# Patient Record
Sex: Male | Born: 1950 | Race: White | Hispanic: No | Marital: Single | Smoking: Former smoker
Health system: Southern US, Community
[De-identification: ages and names within clinical notes are randomized; demographics above are authoritative.]

## PROBLEM LIST (undated history)

## (undated) DIAGNOSIS — E785 Hyperlipidemia, unspecified: Secondary | ICD-10-CM

## (undated) DIAGNOSIS — Q2112 Patent foramen ovale: Secondary | ICD-10-CM

## (undated) DIAGNOSIS — L409 Psoriasis, unspecified: Secondary | ICD-10-CM

## (undated) DIAGNOSIS — I82409 Acute embolism and thrombosis of unspecified deep veins of unspecified lower extremity: Secondary | ICD-10-CM

## (undated) DIAGNOSIS — I10 Essential (primary) hypertension: Secondary | ICD-10-CM

## (undated) DIAGNOSIS — I82401 Acute embolism and thrombosis of unspecified deep veins of right lower extremity: Secondary | ICD-10-CM

## (undated) DIAGNOSIS — F329 Major depressive disorder, single episode, unspecified: Secondary | ICD-10-CM

## (undated) DIAGNOSIS — I878 Other specified disorders of veins: Secondary | ICD-10-CM

## (undated) DIAGNOSIS — R569 Unspecified convulsions: Secondary | ICD-10-CM

## (undated) DIAGNOSIS — E119 Type 2 diabetes mellitus without complications: Secondary | ICD-10-CM

## (undated) DIAGNOSIS — M1990 Unspecified osteoarthritis, unspecified site: Secondary | ICD-10-CM

## (undated) DIAGNOSIS — G629 Polyneuropathy, unspecified: Secondary | ICD-10-CM

## (undated) DIAGNOSIS — K631 Perforation of intestine (nontraumatic): Secondary | ICD-10-CM

## (undated) DIAGNOSIS — Q211 Atrial septal defect: Secondary | ICD-10-CM

## (undated) DIAGNOSIS — F32A Depression, unspecified: Secondary | ICD-10-CM

## (undated) DIAGNOSIS — I4891 Unspecified atrial fibrillation: Secondary | ICD-10-CM

## (undated) DIAGNOSIS — N400 Enlarged prostate without lower urinary tract symptoms: Secondary | ICD-10-CM

## (undated) DIAGNOSIS — E669 Obesity, unspecified: Secondary | ICD-10-CM

## (undated) DIAGNOSIS — R29898 Other symptoms and signs involving the musculoskeletal system: Secondary | ICD-10-CM

## (undated) DIAGNOSIS — I80202 Phlebitis and thrombophlebitis of unspecified deep vessels of left lower extremity: Secondary | ICD-10-CM

## (undated) HISTORY — DX: Obesity, unspecified: E66.9

## (undated) HISTORY — DX: Perforation of intestine (nontraumatic): K63.1

## (undated) HISTORY — DX: Unspecified atrial fibrillation: I48.91

## (undated) HISTORY — DX: Patent foramen ovale: Q21.12

## (undated) HISTORY — PX: TONSILLECTOMY: SHX5217

## (undated) HISTORY — DX: Hyperlipidemia, unspecified: E78.5

## (undated) HISTORY — DX: Atrial septal defect: Q21.1

---

## 1999-04-02 ENCOUNTER — Emergency Department (HOSPITAL_COMMUNITY): Admission: EM | Admit: 1999-04-02 | Discharge: 1999-04-02 | Payer: Self-pay | Admitting: Emergency Medicine

## 1999-04-02 ENCOUNTER — Encounter: Payer: Self-pay | Admitting: Emergency Medicine

## 1999-04-10 ENCOUNTER — Emergency Department (HOSPITAL_COMMUNITY): Admission: EM | Admit: 1999-04-10 | Discharge: 1999-04-10 | Payer: Self-pay | Admitting: Emergency Medicine

## 2001-10-20 ENCOUNTER — Encounter (INDEPENDENT_AMBULATORY_CARE_PROVIDER_SITE_OTHER): Payer: Self-pay | Admitting: Specialist

## 2001-10-20 ENCOUNTER — Ambulatory Visit (HOSPITAL_COMMUNITY): Admission: RE | Admit: 2001-10-20 | Discharge: 2001-10-20 | Payer: Self-pay | Admitting: *Deleted

## 2003-05-07 ENCOUNTER — Inpatient Hospital Stay (HOSPITAL_COMMUNITY): Admission: EM | Admit: 2003-05-07 | Discharge: 2003-05-11 | Payer: Self-pay | Admitting: Emergency Medicine

## 2003-05-10 ENCOUNTER — Encounter: Payer: Self-pay | Admitting: Cardiology

## 2004-04-14 ENCOUNTER — Emergency Department (HOSPITAL_COMMUNITY): Admission: EM | Admit: 2004-04-14 | Discharge: 2004-04-14 | Payer: Self-pay | Admitting: Family Medicine

## 2004-06-27 ENCOUNTER — Inpatient Hospital Stay (HOSPITAL_COMMUNITY): Admission: EM | Admit: 2004-06-27 | Discharge: 2004-07-01 | Payer: Self-pay | Admitting: *Deleted

## 2007-01-25 ENCOUNTER — Encounter (HOSPITAL_BASED_OUTPATIENT_CLINIC_OR_DEPARTMENT_OTHER): Admission: RE | Admit: 2007-01-25 | Discharge: 2007-02-09 | Payer: Self-pay | Admitting: Surgery

## 2007-02-08 ENCOUNTER — Encounter (HOSPITAL_BASED_OUTPATIENT_CLINIC_OR_DEPARTMENT_OTHER): Admission: RE | Admit: 2007-02-08 | Discharge: 2007-02-25 | Payer: Self-pay | Admitting: Surgery

## 2007-04-19 ENCOUNTER — Ambulatory Visit (HOSPITAL_COMMUNITY): Admission: RE | Admit: 2007-04-19 | Discharge: 2007-04-19 | Payer: Self-pay | Admitting: *Deleted

## 2007-04-19 ENCOUNTER — Encounter (INDEPENDENT_AMBULATORY_CARE_PROVIDER_SITE_OTHER): Payer: Self-pay | Admitting: *Deleted

## 2008-03-06 ENCOUNTER — Emergency Department (HOSPITAL_COMMUNITY): Admission: EM | Admit: 2008-03-06 | Discharge: 2008-03-06 | Payer: Self-pay | Admitting: Family Medicine

## 2008-03-08 ENCOUNTER — Emergency Department (HOSPITAL_COMMUNITY): Admission: EM | Admit: 2008-03-08 | Discharge: 2008-03-08 | Payer: Self-pay | Admitting: Emergency Medicine

## 2008-03-10 ENCOUNTER — Emergency Department (HOSPITAL_COMMUNITY): Admission: EM | Admit: 2008-03-10 | Discharge: 2008-03-10 | Payer: Self-pay | Admitting: Emergency Medicine

## 2010-06-29 ENCOUNTER — Observation Stay (HOSPITAL_COMMUNITY)
Admission: EM | Admit: 2010-06-29 | Discharge: 2010-07-03 | Disposition: A | Discharge status: Skilled Nursing Facility | Payer: 59 | Attending: Internal Medicine | Admitting: Internal Medicine

## 2010-06-29 ENCOUNTER — Emergency Department (HOSPITAL_COMMUNITY): Payer: 59

## 2010-06-29 DIAGNOSIS — G609 Hereditary and idiopathic neuropathy, unspecified: Secondary | ICD-10-CM | POA: Insufficient documentation

## 2010-06-29 DIAGNOSIS — I872 Venous insufficiency (chronic) (peripheral): Secondary | ICD-10-CM | POA: Insufficient documentation

## 2010-06-29 DIAGNOSIS — M25569 Pain in unspecified knee: Secondary | ICD-10-CM | POA: Insufficient documentation

## 2010-06-29 DIAGNOSIS — M899 Disorder of bone, unspecified: Secondary | ICD-10-CM | POA: Insufficient documentation

## 2010-06-29 DIAGNOSIS — Z86718 Personal history of other venous thrombosis and embolism: Secondary | ICD-10-CM | POA: Insufficient documentation

## 2010-06-29 DIAGNOSIS — R209 Unspecified disturbances of skin sensation: Secondary | ICD-10-CM | POA: Insufficient documentation

## 2010-06-29 DIAGNOSIS — S82843A Displaced bimalleolar fracture of unspecified lower leg, initial encounter for closed fracture: Principal | ICD-10-CM | POA: Insufficient documentation

## 2010-06-29 DIAGNOSIS — G40909 Epilepsy, unspecified, not intractable, without status epilepticus: Secondary | ICD-10-CM | POA: Insufficient documentation

## 2010-06-29 DIAGNOSIS — Z79899 Other long term (current) drug therapy: Secondary | ICD-10-CM | POA: Insufficient documentation

## 2010-06-29 DIAGNOSIS — Z23 Encounter for immunization: Secondary | ICD-10-CM | POA: Insufficient documentation

## 2010-06-29 DIAGNOSIS — W010XXA Fall on same level from slipping, tripping and stumbling without subsequent striking against object, initial encounter: Secondary | ICD-10-CM | POA: Insufficient documentation

## 2010-06-29 DIAGNOSIS — E669 Obesity, unspecified: Secondary | ICD-10-CM | POA: Insufficient documentation

## 2010-06-29 LAB — DIFFERENTIAL
Basophils Absolute: 0 10*3/uL (ref 0.0–0.1)
Basophils Relative: 0 % (ref 0–1)
Lymphs Abs: 0.9 10*3/uL (ref 0.7–4.0)
Neutro Abs: 9.2 10*3/uL — ABNORMAL HIGH (ref 1.7–7.7)

## 2010-06-29 LAB — BASIC METABOLIC PANEL
BUN: 9 mg/dL (ref 6–23)
Creatinine, Ser: 0.7 mg/dL (ref 0.4–1.5)
GFR calc non Af Amer: >60 mL/min (ref >60)
Glucose, Bld: 107 mg/dL — ABNORMAL HIGH (ref 70–99)
Sodium: 136 mEq/L (ref 135–145)

## 2010-06-29 LAB — URINALYSIS, ROUTINE W REFLEX MICROSCOPIC
Nitrite: NEGATIVE
Specific Gravity, Urine: 1.011 (ref 1.005–1.030)
Urine Glucose, Fasting: NEGATIVE mg/dL
Urobilinogen, UA: 0.2 mg/dL (ref 0.0–1.0)
pH: 6 (ref 5.0–8.0)

## 2010-06-29 LAB — CBC
Hemoglobin: 15.8 g/dL (ref 13.0–17.0)
MCHC: 33.5 g/dL (ref 30.0–36.0)
RDW: 13.3 % (ref 11.5–15.5)

## 2010-06-29 LAB — PROTIME-INR: Prothrombin Time: 23 seconds — ABNORMAL HIGH (ref 11.6–15.2)

## 2010-06-30 LAB — COMPREHENSIVE METABOLIC PANEL
AST: 13 U/L (ref 0–37)
BUN: 9 mg/dL (ref 6–23)
Calcium: 8.5 mg/dL (ref 8.4–10.5)
Chloride: 107 mEq/L (ref 96–112)
Creatinine, Ser: 0.69 mg/dL (ref 0.4–1.5)
GFR calc Af Amer: >60 mL/min (ref >60)
Glucose, Bld: 101 mg/dL — ABNORMAL HIGH (ref 70–99)

## 2010-06-30 LAB — PROTIME-INR: INR: 2.31 — ABNORMAL HIGH (ref 0.00–1.49)

## 2010-06-30 LAB — CBC
Hemoglobin: 15.5 g/dL (ref 13.0–17.0)
MCH: 31.3 pg (ref 26.0–34.0)
MCHC: 32.8 g/dL (ref 30.0–36.0)
RDW: 13.4 % (ref 11.5–15.5)

## 2010-06-30 LAB — PHOSPHORUS: Phosphorus: 3.6 mg/dL (ref 2.3–4.6)

## 2010-06-30 LAB — PHENYTOIN LEVEL, TOTAL: Phenytoin Lvl: 21.6 ug/mL — ABNORMAL HIGH (ref 10.0–20.0)

## 2010-07-01 LAB — PROTIME-INR: INR: 2.84 — ABNORMAL HIGH (ref 0.00–1.49)

## 2010-07-01 LAB — URINE CULTURE: Special Requests: NEGATIVE

## 2010-07-02 LAB — BASIC METABOLIC PANEL
CO2: 27 mEq/L (ref 19–32)
Calcium: 8.4 mg/dL (ref 8.4–10.5)
GFR calc non Af Amer: >60 mL/min (ref >60)
Glucose, Bld: 98 mg/dL (ref 70–99)
Potassium: 3.3 mEq/L — ABNORMAL LOW (ref 3.5–5.1)
Sodium: 137 mEq/L (ref 135–145)

## 2010-07-02 LAB — CBC
Hemoglobin: 15 g/dL (ref 13.0–17.0)
MCH: 33.3 pg (ref 26.0–34.0)
RBC: 4.51 MIL/uL (ref 4.22–5.81)
RDW: 13.3 % (ref 11.5–15.5)
WBC: 6.3 10*3/uL (ref 4.0–10.5)

## 2010-07-02 LAB — PROTIME-INR: INR: 2.76 — ABNORMAL HIGH (ref 0.00–1.49)

## 2010-07-02 NOTE — Consult Note (Signed)
  NAME:  [name], [name]                ACCOUNT NO.:  [id number]  MEDICAL RECORD NO.:  [id number]           PATIENT TYPE:  E  LOCATION:  WLED                         FACILITY:  [facility]  PHYSICIAN:  Alvy Beal, MD    DATE OF BIRTH:  [birth year]  DATE OF CONSULTATION:  06/29/2010 DATE OF DISCHARGE:                                CONSULTATION   REASON FOR CONSULTATION:  Right ankle fracture.  HISTORY:  This is a pleasant 60 year old gentleman who presents to the emergency room after a fall.  He is alert and oriented x3.  The fall happened at approximately 4:45 this morning.  He got out of bed and he was walking to the bathroom when he tripped and injured his right ankle and knee.  He has been unable to bear weight and he has been having significant ankle pain.  As a result he went to the emergency room.  X- rays here in the ER demonstrated a nondisplaced bimalleolar ankle fracture and so he was admitted to the Medical Service and I was consulted.  PAST MEDICAL HISTORY:  The patient has a history of DVTs, seizure disorders, peripheral neuropathy and obesity.  SOCIAL HISTORY:  He lives at home by myself and he is unable to return home currently to take care of himself.  He has had a tonsillectomy.  He is a nondrinker, non drug abuser.  ALLERGIES:  He is allergic to PENICILLIN.  PRIMARY CARE PHYSICIAN:  His neurologist, Genene Churn. Love, M.D.  MEDICATIONS:  Include Coumadin, Dilantin, oxycodone and Keppra.  PHYSICAL EXAMINATION:  He is a pleasant gentleman who appears his stated age in no acute distress.  He is alert and oriented x3.  He has no upper extremity painful range of motion and no gross crepitus, deformity or pain.  No shortness of breath or chest pain.  The abdomen is soft and nontender.  No hip or significant knee pain bilaterally.  He does have diminished sensation throughout in the lower extremities.  His capillary refill on the right side is less than 2 seconds.  He has  a splint applied, which is in good condition.  RADIOLOGIC:  At this point in time, x-rays were reviewed.  He has minimally displaced bimalleolar ankle fractures.  No evidence of any instability or subluxation.  PLAN:  At this point in time we will plan on treating him conservatively.  The patient was admitted because he cannot be discharged to home by himself.  He will be admitted for 3 days.  On Monday, prior to his discharge, I will convert him from the splint to a cast.  I think that he can be successfully treated nonoperatively with cast immobilization and nonweightbearing.  Continue to elevate and ice the ankle appropriately.     Alvy Beal, MD     DDB/MEDQ  D:  06/29/2010  T:  06/29/2010  Job:  [id number]  cc:   Genene Churn. Love, M.D. Fax: [phone]  Alvy Beal, MD Fax: [phone]  Electronically Signed by Venita Lick MD on 07/02/2010 02:30:42 PM

## 2010-07-03 LAB — PROTIME-INR: INR: 2.41 — ABNORMAL HIGH (ref 0.00–1.49)

## 2010-07-10 NOTE — H&P (Signed)
NAME:  [name], [name]                ACCOUNT NO.:  [id number]  MEDICAL RECORD NO.:  [id number]           PATIENT TYPE:  I  LOCATION:  1608                         FACILITY:  [facility]  PHYSICIAN:  Sixto Bowdish I Embry Huss, MD      DATE OF BIRTH:  [birth year]  DATE OF ADMISSION:  06/29/2010 DATE OF DISCHARGE:                             HISTORY & PHYSICAL   PRIMARY CARE PHYSICIAN:  Georgianne Fick, M.D.  CHIEF COMPLAINT:  Right ankle pain and status post fall for 1 day.  HISTORY OF PRESENT ILLNESS:  This is a 60 year old very pleasant gentleman who presented to the emergency room with chief complaint of fall.  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  He experienced a trip today as he has decreased sensation in his lower extremity and injured his right ankle most likely.  He was unable to bear weight on the right foot since the fall.  The patient denies any chest pain.  Denies any shortness of breath.  Denies any syncopal episode.  Pain was 8/10, also associated with some numbness.  In the emergency room, the patient found to have bimalleolar fracture of his right foot, but as the patient lives alone, and he lives with his sister who is having multiple comorbidities, unable to take care of him.  He decided, he would like to be placed.  We were asked to admit the patient for placement and management.  PAST MEDICAL HISTORY:  Significant for, 1. History of recurrent DVTs, on chronic Coumadin. 2. History of seizure disorder. 3. Peripheral neuropathy. 4. Obesity. 5. Chronic venous stasis.  ALLERGIES:  PENICILLIN.  MEDICATIONS:  The patient is on Coumadin, Dilantin, and Keppra.  Also on the medication to be reconciled by pharmacy.  FAMILY HISTORY:  Hypertension, hyperlipidemia, and diabetes.  SOCIAL HISTORY:  He smokes pipe regularly.  Denies any alcohol or using drugs.  He is retired.  REVIEW OF  SYSTEMS:  The patient denies any headache.  Denies any shortness of breath.  Denies any chest pain or abdominal pain.  Denies any nausea or vomiting.  Denies any numbness or weakness of his extremities.  PHYSICAL EXAMINATION:  GENERAL:  Very pleasant gentleman, lying comfortably in bed, no respiratory distress or shortness of breath. VITAL SIGNS:  Temperature 97.4, blood pressure 134/84, pulse 64, respiratory rate 17, saturation 97% on room air. HEENT:  Normocephalic and atraumatic.  Pupils equal reactive to light and accommodation.  Extraocular muscle movement was normal. NECK:  No lymphadenopathy.  No JVD. HEART:  S1 and S2.  There are no added sounds. ABDOMEN:  Obese, soft, nontender.  Bowel sounds positive. EXTREMITIES:Lower extremities edematous bilaterally and there is chronic venous stasis changes. NEUROLOGIC:  The patient moves all 4 extremities.  The patient is awake, alert, and oriented x3 with no focal neurological deficits.  LABORATORY DATA:  Blood work pending.  X-rays of the knee show no acute abnormality except for bilateral malleolar fracture.  ASSESSMENT/PLAN: 1. Mechanical fall. 2. Bilateral malleolar fracture. 3. Ongoing chronic anticoagulation. 4. History of seizures.  I  would plan to admit the patient to regular     floor.  Consulted Dr. Shon Baton for evaluation of bilateral malleolar     fracture, being controlled with his anticoagulation, mainly     Coumadin.  By pharmacy, we will get Dilantin level.  We will resume     the patient's medications.  I will ask Social Work's help with     placement. 5. Deep vein thrombosis and gastrointestinal prophylaxis.     Eural Holzschuh Bosie Helper, MD     HIE/MEDQ  D:  06/30/2010  T:  07/01/2010  Job:  [id number]  Electronically Signed by Ebony Cargo MD on 07/10/2010 02:19:36 PM

## 2010-07-10 NOTE — Discharge Summary (Signed)
NAMEPINCHOS, TOPEL                ACCOUNT NO.:  [id number]  MEDICAL RECORD NO.:  [id number]           PATIENT TYPE:  I  LOCATION:  1608                         FACILITY:  [facility]  PHYSICIAN:  Grayden Burley I Shaniyah Wix, MD      DATE OF BIRTH:  1951/04/08  DATE OF ADMISSION:  06/29/2010 DATE OF DISCHARGE:  07/03/2009                              DISCHARGE SUMMARY   PRIMARY CARE PHYSICIAN:  Georgianne Fick, MD  DISCHARGE DIAGNOSES: 1. Status post fall without evidence of syncope. 2. Bimalleolar ankle fracture, status post cast placement, done by Dr.     Shon Baton. 3. History of chronic recurrent deep venous thromboses, on chronic     anticoagulation. 4. History of seizure. 5. History of peripheral neuropathy. 6. History of venous stasis. 7. Obesity.  DISCHARGE MEDICATIONS: 1. Coumadin 1 mg p.o. daily, alternate with 0.5 mg daily. 2. Dilantin 100 mg p.o. 3 times daily. 3. Vitamin B12, 1000 mg p.o. daily. 4. Keppra 750 mg, total of 1500 mg 3 times daily. 5. Calcium carbonate/vitamin D 600/400, 1 tablet daily. 6. Triamcinolone cream, apply to the legs.  CONSULTATIONS:  Orthopedics consulted, done by Dr. Shon Baton.  PROCEDURES: 1. Chest x-ray, no acute abnormalities. 2. Knee x-ray, no acute abnormalities. 3. Right ankle, nondisplaced, medial and lateral malleolar fracture.  HISTORY OF PRESENT ILLNESS:  This is a 60 year old very pleasant gentleman, presented to the ED with complaint of fall, had a history of peripheral neuropathy and history of recurrent DVTs and chronic venous stasis.  He had experienced a trip today.  He denies any loss of conscious.  Denies any chest pain, denies any shortness of breath.  When presented to the ED, patient found to have bimalleolar fracture of his right lower extremity, and he lives alone.  We were asked to admit for possible placement. 1. Right bimalleolar fracture, has a cast and per Dr. Shon Baton'     recommendation needs to have nonweightbearing on the  right leg and     he needs to follow up within 1 week with Dr. Shon Baton at [facility], [phone]; have the cast placed there. 2. Seizure, patient's Dilantin level was high, which is 21.9, but in     consideration, albumin is 3.4, usual goal is 10 to 20, will     decrease the dose to 100 mg p.o. t.i.d. and patient will continue     with home dose of Keppra. 3. Chronic DVTs, patient will continue with Coumadin, patient takes     0.5 to 1 mg and we will ask the nursing home to adjust the dose of     Coumadin.  Currently, we felt the patient is stable for discharge,     further     recommendation to be dictated by the discharge physician if needed. 4. The patient will need to follow up with Dr. Shon Baton within in 1 week     at [facility] and continue physical therapies as does     need with nonweightbearing of his right lower extremities.     Mckay Tegtmeyer I Eda Paschal, MD     HIE/MEDQ  D:  07/02/2010  T:  07/02/2010  Job:  [id number]  Electronically Signed by Ebony Cargo MD on 07/10/2010 02:19:27 PM

## 2010-09-24 NOTE — Assessment & Plan Note (Signed)
[facility]   NAME:  [name], [name]                ACCOUNT NO.:  [id number]   MEDICAL RECORD NO.:  [id number]      DATE OF BIRTH:  Jul 06, 1950   PHYSICIAN:  Jake Shark A. Tanda Rockers, M.D. 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  In the interim he was treated with a non-compressive wrap,  he continues to be ambulatory.  There has been no pain, no fever.  He  continues to have weeping.  He continues on his doxycycline.   OBJECTIVE:  His blood pressure is 140/83, respirations 18, pulse rate  68, temperature 98.2.  He is accompanied by sister.  Inspection of the  left lower extremity shows severe desquamative changes with weeping and  local warmth.  His edema extends onto the dorsum of the foot.  The  capillary refill is brisk, pedal pulses are indeterminate.  There is 3+  edema with chronic changes to a lesser degree in the right lower  extremity also.   ASSESSMENT:  Severe postphlebitic syndrome with stasis cellulitis.   PLAN:  We are placing the patient in a left Unna wrap with triamcinolone  cream.  We will place lamb's wool between his toes to avoid compression.  We will reevaluate him in 1 week.  Ultimately the patient will require  custom below-the-knee stockings to avoid secondary breakdown related to  the postphlebitic state.  We have explained this approach to the patient  and his sister in terms they seem to understand.  We have given him an  opportunity to ask questions.  They express gratitude for having been  seen in the clinic and indicate that they will be compliant.      Harold A. Tanda Rockers, M.D.  Electronically Signed     HAN/MEDQ  D:  02/10/2007  T:  02/11/2007  Job:  [id number]

## 2010-09-24 NOTE — Consult Note (Signed)
NAME:  [name], TRICKEY                ACCOUNT NO.:  [id number]   MEDICAL RECORD NO.:  [id number]          PATIENT TYPE:  REC   LOCATION:  FOOT                         FACILITY:  [facility]   PHYSICIAN:  Jonelle Sports. Sevier, M.D. DATE OF BIRTH:  12/02/1950   DATE OF CONSULTATION:  02/02/2007  DATE OF DISCHARGE:                                 CONSULTATION   HISTORY:  This 60 year old white male is referred at the courtesy of Dr.  Nicholos Johns for assistance with management of a venous stasis  ulceration on the medial malleolar area of the left lower extremity.   The patient has a complex medical history and has had at least two  episodes of deep vein thrombosis in the past, both in the left lower  extremity, the most recent one occurring some 6 years ago.  He has been  chronically anticoagulated with Coumadin.   Beginning 5 or 6 weeks ago, he noted increased swelling, redness, and  blister-like formation on the dorsal aspect on that left lower extremity  and this gradually progressed into the malleolar and supramalleolar area  as well.  He apparently initially was given a course of antibiotics  which was not continued after the first 1-week course.  Most recently he  has been treating this just with the application of Polysporin.  He was  referred here for further evaluation and advice.  Apparently was at one  point an open ulceration in the medial malleolar or gator area, and this  has since healed, although the entire area remains reddened and weepy.   PAST MEDICAL HISTORY:  Notable for no previous surgeries.  He has had  several hospitalizations in conjunction with his DVT and other problems.   ALLERGIES:  HE IS SAID TO BE ALLERGIC TO PENICILLIN BUT ON CLOSER  QUESTIONING THIS IS JUST AN EXTREMELY STRONG FAMILY HISTORY TO  PENICILLIN AND ITS DERIVATIVES.  HE HAS A BROTHER WHO WAS PLACED ON A  PENICILLIN DERIVATIVE, PRESUMABLY A CEPHALOSPORIN, AND HAD A SEVERE  REACTION WITHOUT HAVING HAD  ANY PREVIOUS EXPOSURE TO PENICILLIN.  I  THINK IT A FAIR ASSUMPTION THAT THE PATIENT MIGHT WELL BE EQUALLY  SENSITIVE.   CURRENT MEDICATIONS:  1. Neurontin 800 mg q.i.d.  2. Dilantin 200 mg b.i.d.  3. Coumadin 5 mg daily.  4. Keppra 250 mg 2 twice daily.  5. Calcium tablet daily.  6. Levaquin 500 mg daily which has been discontinued.  7. Protonix 40 mg per day.  8. Lortab 5/500 on a p.r.n. basis.  9. Colace 1 capsule b.i.d.  10.Vitamin B12 orally 100 mg daily.   PERSONAL HISTORY:  The patient is retired and really essentially  disabled by his leg.  He gets very little physical activity.  He has  been a smoker of a pipe for some 20+ years.  He denies use of alcoholic  beverages or recreational drugs.  His last tetanus immunization was in  2008.   REVIEW OF SYSTEMS:  The patient has longstanding seizure disorder which  has been well-controlled lately on the multiplicity of medications as  indicated above.  He  also is said to have a history of PVD, but this is  probably venous disease not arterial, with the DVT history as indicated  in the present illness.  He also has some degree of peripheral  neuropathy, the basis for which is not certain.  He has had bone density  study done 2 months ago which he was told was satisfactory.   FAMILY HISTORY:  The family history, which should have been included  above, does include high blood pressure and heart attack, neither of  which the patient has experienced, and also diabetes, again with the  patient having no apparent personal history of this disease.   PHYSICAL EXAMINATION:  Examination examination today is limited to vital  signs and lower extremities.  VITAL SIGNS:  Blood pressure is 139/74, pulse is 65 and regular,  respirations 18, temperature 97.8.  EXTREMITIES:  Both lower extremities show evidence of chronic venous  insufficiency with some chronic edema and stasis changes, this being  relatively minimal on the right but with  extensive swelling,  cobblestoning, and severe discoloration of the left lower extremity.  More importantly, in a boot-like distribution from approximately the  distal third of the tibia all way out to the base of the toes, there is  redness, swelling, and desquamation of the skin of the left lower  extremity with considerable warmth in this area and some weeping.  There  is no open ulceration at this point.  Skin temperature is clearly quite  elevated, particular in comparison to the contralateral side.  There is  so much edema on the dorsum of the foot that pulses are not palpable,  but on Doppler determination, we were able to hear a good signal and the  wave indeed appears to be triphasic.   IMPRESSION:  History of deep vein thrombosis with postphlebitic syndrome  on the left lower extremity with recent ulceration spontaneously healed.  Cellulitis of the distal lower extremity.   DISPOSITION:  The plan here is to see if we can control what appears to  be an apparent superficial infection with cellulitis to some extent  before proceeding to wraps of this extremity.  Once those are completed  and the leg is well-healed, this gentleman may well be a candidate for  venous pump, particularly in this left lower extremity.   Accordingly, the leg is cleansed today and is covered with an  application of Neosporin and this in turn covered with a soft Kerlix  wrap.   The patient is instructed to redress the leg daily, cleansing it gently  with mild soapy water, rinsing it well, patting it dry, and then  reapplying Neosporin to the reddened areas and returning the extremity  to a soft, noncompressive bulky wrap.   He is advised to keep the foot elevated as much as possible.   He is given a prescription for doxycycline to be taken 200 mg initially  and then 100 mg daily.  He was given a total of 20 of these with no  refill.   FOLLOW UP:  Followup visit will be here in 1 week, and  hopefully by that  time he will be ready for Unna wrap.           ______________________________  Jonelle Sports. Cheryll Cockayne, M.D.     RES/MEDQ  D:  02/02/2007  T:  02/02/2007  Job:  [id number]

## 2010-09-24 NOTE — Op Note (Signed)
NAME:  [name], [name]                ACCOUNT NO.:  [id number]   MEDICAL RECORD NO.:  [id number]          PATIENT TYPE:  AMB   LOCATION:  ENDO                         FACILITY:  [facility]   PHYSICIAN:  Georgiana Spinner, M.D.    DATE OF BIRTH:  09/09/1950   DATE OF PROCEDURE:  04/19/2007  DATE OF DISCHARGE:                               OPERATIVE REPORT   PROCEDURE:  Colonoscopy with polypectomy and biopsy.   INDICATIONS:  Colon polyps.   ANESTHESIA:  Fentanyl 75 mcg, Versed 5 mg.   PROCEDURE:  With the patient mildly sedated in the left lateral  decubitus position, a rectal examination was performed.  Limited examine  of the prostate was normal.  Subsequently the Pentax videoscopic  colonoscope was inserted in the rectum and passed under direct vision to  the cecum identified by ileocecal valve and appendiceal orifice, both of  which were photographed.  From this point colonoscope was slowly  withdrawn taking circumferential views of colonic mucosa stopping in the  just above the cecum where a polyp was seen photographed, removed using  snare cautery technique setting of 20/150 blended current.  We then  removed the remainder of the polyp with hot biopsy forceps and retrieved  all the tissue.  From this point colonoscope was slowly withdrawn taking  circumferential views of colonic mucosa stopping then only in the rectum  which appeared normal on direct showed hemorrhoids on retroflexed view.  The endoscope was straightened and withdrawn.  The patient's vital  signs, pulse oximeter remained stable.  The patient tolerated procedure  well without apparent complications.   FINDINGS:  Polyp near the cecum removed and internal hemorrhoids were  also noted.   PLAN:  Await biopsy report.  The patient will call me for results and  follow-up with me as an outpatient.           ______________________________  Georgiana Spinner, M.D.     GMO/MEDQ  D:  04/19/2007  T:  04/19/2007  Job:  [id number]

## 2010-09-24 NOTE — Assessment & Plan Note (Signed)
[facility]   NAME:  [name], [name]                ACCOUNT NO.:  [id number]   MEDICAL RECORD NO.:  [id number]      DATE OF BIRTH:  Apr 29, 1951   PHYSICIAN:  Jake Shark A. Tanda Rockers, M.D. VISIT DATE:  02/18/2007                                   OFFICE VISIT   SUBJECTIVE:  [name] is a 60 year old man who we are following for  stasis and weeping.  There has never been frank ulceration.  Over the  last 2 weeks, we have treated him with serial compression wrap.  He  returns for follow-up.  There has been no interim drainage, malodor or  ulceration.  He continues to be ambulatory.   OBJECTIVE:  Blood pressure is 151/86, respirations 18, pulse rate 64,  temperature is 97.  He is accompanied by his sister.  Inspection of the  left lower extremity shows that there is a persistence of stasis changes  with +2 to 3 edema and desquamation with shedding of skin.  There are  absolutely no ulcerations.  Compression has been relatively successful.  It is manifested by some areas of wrinkling in the mid lower extremity.   ASSESSMENT:  Stasis without ulceration.   RECOMMENDATIONS:  We are making a transition from the Unna wrap to  bilateral open toed, below-the-knee, 30-40 mm compression hose.  We have  explained the use of external compressive hose in the management of  chronic stasis disease.  The patient seems to understand.  He has been  instructed to procure these garments and to wear them.  In addition, we  have given him a prescription for triamcinolone ointment.  He will apply  the ointment at night and will wear a long cotton sock.  He will shower  twice daily and wear the support hose during his awake hours.  He is not  to sleep in the support hose.  He will need to replace the hose  approximately every 3-4 months or when the elasticity has been spent.   We have given the patient and his sister an opportunity to ask  questions.  They both seem to understand and indicate  that they will be  compliant. He is discharged.      Harold A. Tanda Rockers, M.D.  Electronically Signed     HAN/MEDQ  D:  02/18/2007  T:  02/19/2007  Job:  [id number]   cc:   Georgianne Fick, M.D.

## 2010-09-27 NOTE — Discharge Summary (Signed)
[name], [name]                ACCOUNT NO.:  [id number]   MEDICAL RECORD NO.:  [id number]          PATIENT TYPE:  INP   LOCATION:  6709                         FACILITY:  [facility]   PHYSICIAN:  Michaelyn Barter, M.D. DATE OF BIRTH:  1950/08/15   DATE OF ADMISSION:  06/27/2004  DATE OF DISCHARGE:  07/01/2004                                 DISCHARGE SUMMARY   FINAL DIAGNOSES:  1.  Right leg deep vein thrombosis.  2.  Right leg cellulitis.   PROCEDURES:  1.  Abdominal ultrasound, June 28, 2004.  2.  Left knee x-ray, June 27, 2004.  3.  Chest x-ray and venous duplex of the right lower extremity, June 28, 2004.   HISTORY OF PRESENT ILLNESS:  [name] is a 61 year old gentleman with a  past medical history of left lower extremity cellulitis and DVT who  presented with a chief complaint of right lower leg swelling for 4 days.  He  also complained of some erythema and inability to ambulate secondary to  pain.  He went on to say that his symptoms had begun in December, at which  time he was seen by his primary care physician, who prescribed him oral  antibiotics.  He went on to state that he saw his primary care physician  again approximately 2 weeks prior to this admission, and was started on  cefadroxil.  He completed the 2-week course of antibiotics; however, over  the 4 days prior to this admission, his pain increased.   PAST MEDICAL HISTORY:  1.  Seizure disorder.  2.  Peripheral neuropathy.  3.  DVT of the left lower extremity.   ALLERGIES:  The patient stated that his family was allergic to PENICILLIN,  and he decided that he would prefer not to take penicillin secondary to a  questionable allergy himself to penicillin.   FAMILY HISTORY:  Hypertension.  Hyperlipidemia.  Diabetes mellitus in  mother, father, sister, and brother.   SOCIAL HISTORY:  The patient smokes a pipe.  Alcohol is occasional.  Street  drugs denied.   HOSPITAL COURSE:  1.  Right  lower extremity cellulitis.  The patient was admitted into the      hospital and started on IV antibiotics.  Initially, Ancef 1 gm IV q.8h.      was initiated.  He was also provided Lasix for the edema, and over the      course of his hospitalization, the patient's complaints regarding his      right leg cellulitis decreased.  He completed 4 days of IV antibiotics      for the cellulitis, and he appeared to respond well to the IV      antibiotics.  2.  Right lower extremity deep vein thrombosis.  A venous duplex was      completed on June 28, 2004.  The final impression was within the      right leg there appeared to be an occlusive DVT in the common femoral,      femoral, profunda, and popliteal veins.  The greater saphenous vein  appears thrombosed, as well.  Left - there appears to be occlusive      superficial thrombus in the greater saphenous vein proximally and down      the leg.  The common femoral vein appears patent.  No evidence of DVT      bilaterally.  No evidence of Baker's cyst.  The patient's Coumadin,      which he had previously been on, was optimized at this particular time,      and, again, over the course of the patient's hospital, he appeared to      have no additional complaints.  The patient did have x-rays completed of      his left knee, the final results of which were no acute bony findings,      no definite joint effusion.  In addition, when the patient initially      came into the hospital, he had a chest x-ray completed, which was      interpreted as low volume chest film with vascular crowding, bibasilar      atelectasis, and accentuation of the heart size.  No edema or effusions.      Question dilated small bowel in the upper abdomen.  He also had an      abdominal ultrasound completed, and the final impression of this was the      study is somewhat limited by gas and the patient's body habitus.  The      gallbladder, common bile duct, and right lobe  of the liver are normal,      except for some fatty infiltration and diffuse increase in echogenicity.   The patient appeared to improve over the course of his hospitalization.  Therefore, the patient was made to discharge the patient home.  The patient  was discharged to home on July 01, 2004.   DISCHARGE MEDICATIONS:  1.  Levaquin 500 mg p.o. daily.  2.  Coumadin 5 mg on Monday, Wednesday, Thursday, Friday, and Sunday.  3.  Coumadin 7.5 mg on Tuesday and Saturday.  4.  Colace 100 mg b.i.d.  5.  Neurontin 800 mg q.i.d.  6.  Protonix 40 mg daily.  7.  Lortab 5/500, 1-2 tablets q.6h. p.r.n. for pain.   DISCHARGE INSTRUCTIONS:  1.  He was instructed to see his primary care physician, Dr. Nicholos Johns,      within 1-2 weeks for follow up.  2.  He was to take all of his medications as prescribed.  3.  [facility] was to provide a tall rolling walker.      OR/MEDQ  D:  09/15/2004  T:  09/16/2004  Job:  [id number]   cc:   Georgianne Fick, M.D.  [address]  [place]  [place] [address]  Fax: [phone]

## 2010-09-27 NOTE — Procedures (Signed)
[facility]  Patient:    [name], [name] Visit Number: [id number] MRN: [id number]          Service Type: END Location: ENDO Attending Physician:  Sabino Gasser Dictated by:   Sabino Gasser, M.D. Proc. Date: 10/20/01 Admit Date:  10/20/2001                             Procedure Report  PROCEDURE:  Colonoscopy.  INDICATION FOR PROCEDURE:  Colon polyp, family history of colon cancer.  ANESTHESIA:  None further given.  DESCRIPTION OF PROCEDURE:  With the patient mildly sedated in the left lateral decubitus position, a rectal exam was performed which was unremarkable other than external hemorrhoids. Subsequently, the Olympus videoscopic colonoscope was inserted in the rectum and passed under direct vision to the cecum identified by the ileocecal valve and appendiceal orifice both of which were photographed. From this point, the colonoscope was slowly withdrawn taking circumferential views of the entire colonic mucosa, stopping only at approximately 25 cm from the anal verge at which point a small polyp was seen, photographed and removed using snare cautery technique on a setting of 20:20 blended current. We withdrew then all the way to the rectum which appeared normal in direct and showed hemorrhoids on retroflexed view. The endoscope was straightened and withdrawn. The patients vital signs and pulse oximeter remained stable. The patient tolerated the procedure well without apparent complications.  FINDINGS:  Polyp at 25 cm, internal hemorrhoids, external hemorrhoids.  PLAN:  Await biopsy report. The patient will call me for results and followup with me as an outpatient. Dictated by:   Sabino Gasser, M.D. Attending Physician:  Sabino Gasser DD:  10/20/01 TD:  10/21/01 Job: [id number]

## 2010-09-27 NOTE — H&P (Signed)
NAME:  [name], [name]                ACCOUNT NO.:  [id number]   MEDICAL RECORD NO.:  [id number]          [name] TYPE:  EMS   LOCATION:  MAJO                         FACILITY:  [facility]   PHYSICIAN:  Mallory Shirk, MD     DATE OF BIRTH:  1951-02-17   DATE OF ADMISSION:  06/27/2004  DATE OF DISCHARGE:                                HISTORY & PHYSICAL   CHIEF COMPLAINT:  Right lower leg swelling, pain x4 days.   HISTORY OF PRESENT ILLNESS:  [name] is a very pleasant 60 year old  Caucasian gentleman with a history of left lower extremity cellulitis, DVTs  on Coumadin with INR 2.1 presents with complaint of right lower extremity  pain, swelling, erythema and inability to ambulate secondary to pain x4  days.  [name] states symptoms started last December, was seen by PCP and  given some p.o. antibiotics (name which he does not remember), was seen 2  weeks ago by Dr. Nicholos Johns again for swelling of the right lower  extremity and given a first generation cephalosporin (cefadroxil).  He has  completed the 2-week course however, his pain increased 4 days ago.  He  noticed an episode of chills with diaphoresis but no fever at the onset of  this.  The pain got progressively worse to a point where today he was unable  to ambulate.  No other complaints at this time.   PAST MEDICAL HISTORY:  1.  Seizure disorder.  2.  Peripheral neuropathy.  3.  DVT left lower extremity.   MEDICATIONS ON ADMISSION:  1.  Dilantin 200 mg p.o. b.i.d.  2.  Keppra 1500 mg p.o. b.i.d.  3.  Neurontin 800 mg p.o. q.i.d.  4.  Lortab 5/500 one tablet p.o. t.i.d. p.r.n. pain.  5.  Coumadin 5 mg p.o. once daily with 7.5 mg on Saturday and Tuesday.   ALLERGIES:  [name] states that his family has allergies to PENICILLIN so he  believes he has a PENICILLIN allergy even though it has not been documented;  however, he prefers not to take PENICILLIN at this point.   FAMILY HISTORY:  Hypertension, hyperlipidemia and  diabetes mellitus in  mother, father, sister and brother.   SOCIAL HISTORY:  [name] smokes a pipe regularly, occasional alcohol use, no  drug use.   REVIEW OF SYSTEMS:  [name] denies any headaches, shortness of breath, chest  pain, abdominal pain, recent change in bowel habits, recent change in  appetite or weight change.  [name] does complain of right lower extremity  pain as described in the HPI.   PHYSICAL EXAMINATION:  GENERAL:  Very pleasant Caucasian gentleman lying in  bed in no acute distress alert and oriented x3 cooperative with the exam.  VITAL SIGNS ON ADMISSION:  Blood pressure 119/68, pulse 100, respirations  16, temperature 98.4.  HEENT:  Normocephalic, atraumatic, PERRL, sclerae anicteric, mucous  membranes moist, oropharynx nonerythematous, neck supple, no  lymphadenopathy, no JVD.  LUNGS:  Clear to auscultation bilaterally.  CVS:  S1 and S2 regular rate and rhythm, no murmurs, rubs or gallops.  ABDOMEN:  Obese, soft, positive bowel  sounds, no tenderness, no masses.  EXTREMITIES:  Right lower extremity edematous, 2+ pitting edema, [name] had  difficulty bending his right knee secondary to the edema and tightness.  Left lower extremity venostasis changes seen.  [name] able to move right  knee with difficulty, left knee range of motion intact.  NEUROLOGIC:  Cranial nerves II-XII grossly intact.  Essentially motor within  normal limits.  No focal deficits seen.   LABORATORIES ON ADMISSION:  WBC 10.7, hemoglobin 15, hematocrit 43.7,  platelets 177.  PT 20, INR 2.1, PTT 62.  Sodium 136, potassium 4.2, chloride  104, carbon dioxide 24, glucose 112, BUN 19, creatinine 0.9, total bili 0.7,  alkaline phosphatase 187, SGOT 104, SGPT 146, total protein 6.5, albumin  2.5, calcium 8.5, BNP (beta natriuretic peptide) less than 30.   ASSESSMENT AND PLAN:  [age]-year-old Caucasian gentleman with a history  of lower extremity cellulitis on Coumadin presenting with right  lower  extremity pain, swelling, erythema x4 days.   Problem 1. RIGHT LOWER EXTREMITY CELLULITIS.  [name] is started on Ancef 1  g IV q.8h.  Dopplers will be done to rule out deep vein thrombosis.  [name]  has been given 20 mg of Lasix for edema in the right lower extremity.  Even  though BNP is less than 30 no evidence of congestive heart failure on  physical exam.  Chest x-ray still pending.   Problem 2. RIGHT KNEE PAIN.  Right knee will be x-rayed to rule out any  damage to the joint.   Problem 3. PROPHYLAXIS.  [name] is on Coumadin.  INR 2.1.  Coumadin  discontinued.  Protonix 40 mg p.o. once daily.   Problem 4. SEIZURE DISORDER.  [name]'s Dilantin, Keppra and Neurontin have  been restarted.   Problem 5. PAIN MANAGEMENT.  [name] is on Lortab 5/500 q.4-6h. p.r.n. pain.   DISPOSITION:  [name] will need some physical therapy since there is some  evidence of deconditioning.  Social work consult has been requested.  PT/OT  evaluation has also been requested.      GDK/MEDQ  D:  06/27/2004  T:  06/27/2004  Job:  [id number]   cc:   Georgianne Fick, M.D.  [address]  [place]  [place] [address]  Fax: [phone]

## 2010-09-27 NOTE — Discharge Summary (Signed)
NAME:  [name], [name]                          ACCOUNT NO.:  [id number]   MEDICAL RECORD NO.:  [id number]                   PATIENT TYPE:  INP   LOCATION:  3008                                 FACILITY:  [facility]   PHYSICIAN:  Genene Churn. Love, M.D.                 DATE OF BIRTH:  04-Mar-1951   DATE OF ADMISSION:  05/07/2003  DATE OF DISCHARGE:  05/11/2003                                 DISCHARGE SUMMARY   PATIENT ADDRESS:  [address] [place], [place] [address]   HISTORY OF PRESENT ILLNESS:  This is one of multiple [facility]  admissions for this 60 year old right-handed, white, single male who has a  20-year history of epilepsy and is now admitted for recurrent right brain  focal seizures.   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 These can be associated with a laugh followed by his head turning  to the left.  They usually last about 45 seconds.  While on Dilantin and  Neurontin, in the past, he has had seizures as frequently as once per month  that are simple partial. He developed symptoms of a neuropathy and has been  changed, 6 months ago, from Dilantin to Keppra 1000 mg twice per day,  results on 800 mg t.i.d.; and Coumadin 2.5 mg per day.   He was in his usual state of health, was seen as an outpatient for left leg  cellulitis and underwent a course of Keflex medication.  He began having  focal seizures over the 24 hours prior to admission lasting 2-5 minutes  associated with his head turning to the left, his right arm elevating and  his eyes deviating to the left.  He was unable to talk during the events,  but was able to understand what was going on.  He was admitted by Dr. Anne Hahn  for further evaluation.   PAST MEDICAL HISTORY:  His past medical history is significant for seizure  disorder with gelastic seizures and right brain focus.  He has  had a prior  history of encephalitis and possibly [facility] spotted fever, about 20  years ago.  He has had chronic venous insufficiency in his left leg and  recent ulcer of the pretibial area following a fall with trauma.  He has a  history of degenerative arthritis of his right knee, obesity.  Status post  tonsillectomy and he has a history of peripheral neuropathy.   MEDICATIONS AT THE TIME OF ADMISSION:  1. Keppra 1000 mg twice per day.  2. Neurontin 800 mg t.i.d.  3. Keflex 500 mg q.i.d.  4. Variable dosages of Coumadin, usually 5 mg daily.   ALLERGIES:  He has no known allergies.   SOCIAL HISTORY:  He smokes a pipe.  He occasionally drinks beer or  wine.  The patient is single.  He lives with his sister in [place].  He is  retired.  He has no children.   FAMILY MEDICAL HISTORY:  Is significant in that his mother is alive, has a  history of hypertension and hypercholesterolemia.  His father died with a  stroke in 58.  The patient has 1 brother with diabetes, hypertension, and  heart disease.  His sister is alive and well.   PHYSICAL EXAMINATION:  VITAL SIGNS:  His examination at the time of  admission revealed a well-developed, white male.  Blood pressure 166/93,  heart rate 92 and regular, respiratory rate was 18, temperature is afebrile.  GENERAL/NEUROLOGIC:  His general examination and neurologic examination were  unremarkable.  He would have frequent seizures with his right arm raising  and his head and eyes turning to the left usually lasting up to 1-2 minutes.   LABORATORY DATA:  Revealed his serum sodium 136, potassium 4.3, chloride  105, glucose 112, BUN 8, creatinine 0.8.  His comprehensive metabolic panel  was unremarkable with normal liver function tests; pH 7.417, pCO2 of 25.9,  bicarb 16.7.  His white blood cell count was 6400 with a hemoglobin of 15.4  and hematocrit of 44.8.  Platelet count was 153,000.  There was 65% polys,  18% lymphs, 12% monocytes.   His INR was 3.1 and went as high as 3.4 in the  hospital.  His Depakote level was 64.7  EKG showed a normal sinus rhythm  with a normal EKG.  His chest x-ray showed mild peribronchial thickening and  subsegmental atelectasis.  CT scan of the brain showed cerebral and  cerebellar atrophy.  MRI study of the brain showed cortical edema in the  right frontal and right parietal lobes which were weekly positive on  diffusion. This could be secondary to subacute infarction or possibly due to  cerebritis or due to the seizures themselves.  His EEG was abnormal showing  4 generalized electrographic behavioral seizures that did not appear to be  significantly focal but calculative to the background activity.   HOSPITAL COURSE:  The patient was admitted and placed on Depakote.  His  Keppra was increased and his Neurontin was increased.  Slowly, over the next  2-3 days his seizure frequency decreased, but Dilantin had to be added, as a  load, to finally stop the seizures.   IMPRESSION:  1. Recurrent right brain focal seizures.  Code 345.51  2. Left leg cellulitis.  3. Obesity.  4. Chronic venous insufficiency of the left leg.  5. History of degenerative arthritis of the right knee.  6. Obesity.  7. Status post tonsillectomy.  8. Peripheral neuropathy possibly secondary to Dilantin.   PLAN:  1. The plan, at this time is to discharge the patient on Biaxin 500 mg     b.i.d.  2. Discontinue Depakote in a taper, 500 mg b.i.d. x3 days and then     discontinue.  3. Discharge on Dilantin 200 mg b.i.d.  4. ________ 50 mg 2 b.i.d.  5. Neurontin 800 mg q.i.d.  6. Get a pro time in the morning and hold today's Coumadin.                                                Genene Churn. Sandria Manly, M.D.    JML/MEDQ  D:  05/11/2003  T:  05/11/2003  Job:  [id number]   cc:   Georgianne Fick, M.D.  [address]  [place]  [place] [address] Fax: [phone]

## 2010-09-27 NOTE — Procedures (Signed)
[facility]  Patient:    [name], [name] Visit Number: [id number] MRN: [id number]          Service Type: END Location: ENDO Attending Physician:  Sabino Gasser Dictated by:   Sabino Gasser, M.D. Proc. Date: 10/20/01 Admit Date:  10/20/2001                             Procedure Report  PROCEDURE:  Endoscopy.  INDICATION FOR PROCEDURE:  Gastroesophageal reflux disease.  ANESTHESIA:  Demerol 70, Versed 7 mg.  DESCRIPTION OF PROCEDURE:  With the patient mildly sedated in the left lateral decubitus position, the Olympus video endoscope was inserted in the mouth and passed under direct vision through the esophagus which appeared normal until we reached the distal esophagus where there were changes of esophagitis and/or Barretts esophagus photographed and biopsied. We entered into the stomach. The fundus, body, antrum, duodenal bulb and second portion of the duodenum all appeared normal. The endoscope then was withdrawn taking circumferential views of the remaining duodenal mucosa until the endoscope was then pulled back in the stomach and placed in retroflexion to view the stomach from below and a hiatal hernia was seen and photographed. The endoscope was straightened and withdrawn. The patients vital signs and pulse oximeter remained stable. The patient tolerated the procedure well without apparent complications.  FINDINGS:  Esophagitis above the hiatal hernia. Await biopsy report. The patient will call me for results and followup with me as an outpatient. Proceed to colonoscopy as planned. Dictated by:   Sabino Gasser, M.D. Attending Physician:  Sabino Gasser DD:  10/20/01 TD:  10/21/01 Job: [id number]

## 2010-09-27 NOTE — Procedures (Signed)
INDICATIONS FOR PROCEDURE:  The patient is a nearly 60 year old with a 64-  year-old history of seizures treated with Dilantin until six months ago.  The patient had onset of generalized tonic clonic seizures in the recent  past associated with right brain signature seizures with a version of the  head in the eye on the left.  This has happened numerous times in the last  24 hours.  Study is being done to look for the presence of epilepsy.   DESCRIPTION OF PROCEDURE:  Tracing is carried out on a 32-channel digital  [place] recorder reformatted into 16-channel montages with one devoted to  EKG.  The patient was awake during the recording.  He takes  Keppra,  Neurontin, and Depakote.  The initial 10 to 20 system lead placement was  used.   FINDINGS:  The record begins with a background of alpha and beta range  activity of about 10 microvolts.  There was a sudden paroxysmal onset of 15  hertz, 20 to 25 microvolt activity that was generalized.  This was then  obscured by high voltage muscle artifact, but the rhythmic alpha range  activity could be seen in the occipital regions.  Finally, there was  rhythmic predominantly delta range activity that was sharply contoured, 50  microvolts in 3 to 4 hertz.  This gives way to waking background which is 9  hertz alpha range activity of 10 to 15 microvolts mixed with generalized  rhythmic beta range activity and under 20 microvolt beta range activity.  There do not appear to be significant focal slowing in the background when  the record was compressed.  The same sequence of activity occurs with  similar result, only there is somewhat greater slowing of the background  following the second.  The seizure lasted approximately 70 seconds. This  again appeared to start off as a generalized rhythmic alpha range activity  that then shifted to rhythmic delta range activity before returning to  baseline.  The patient was drowsy, but failed to drift into  natural sleep.   A third and fourth episode lasting 70 to 80 seconds were seen prior to the  end of the record.  EKG showed normal sinus rhythm with ventricular response  of 60 beats per minute.   IMPRESSION:  Abnormal EEG with four generalized electrographic and  behavioral seizures.  There did not appear to be significant focality in the  background.  There was generalized slowing in the interictal aftermath.    WILLIAM H. Sharene Skeans, M.D.   JXB:[id number]  D:  27/04/2003 21:48:44  T:  28/04/2003 00:47:43  Job #:  [id number]   cc:   Marlan Palau, M.D.  [address]  [address]  [place]  [place] [address]  Fax: [phone]

## 2010-09-27 NOTE — H&P (Signed)
NAME:  [name], [name]                          ACCOUNT NO.:  [id number]   MEDICAL RECORD NO.:  [id number]                   PATIENT TYPE:  EMS   LOCATION:  [facility]                                 FACILITY:  [facility]   PHYSICIAN:  Marlan Palau, M.D.               DATE OF BIRTH:  1950-08-26   DATE OF ADMISSION:  05/07/2003  DATE OF DISCHARGE:                                HISTORY & PHYSICAL   HISTORY OF PRESENT ILLNESS:  [name] is a 60 year old right-handed  white male, admitted May 07, 2003, with a history of seizure problems,  followed by Dr. Sandria Manly.  This patient normally has seizures once every two  months or so, usually associated with gelastic type seizures with laughing  prior to the onset of the seizure, staring spells, sometimes some head  turning to the left.  The patient had been switched off of Dilantin six  months ago and placed on Keppra, and was also on Neurontin.  The patient has  done relatively well on this combination, but a week ago was placed on  Keflex for an ulcer in the left pretibial area of the leg.  The patient has  begun to have very frequent seizures over the last 24 hours, having one  every 2-5 minutes, associated with head turning to the left, the right arm  elevating, eye deviation to the left.  The patient is unable to talk during  the event, but is able to understand what is said around him during the  seizure.  The patient is being brought into the emergency room for further  evaluation.  The patient has not been running any fevers or chills with the  above problem.  The patient has complained of a headache over the last two  days.  CT scan of the head is pending at this time.   PAST MEDICAL HISTORY:  1. History of seizure disorder with gelastic seizures, right brain focus.  2. 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  This began 20 years ago.  3. 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  4. History of degenerative arthritis of the right knee.  5. Obesity.  6. Status post tonsillectomy.  7. History of peripheral neuropathy.  Mild gait disturbance associated with     this.   MEDICATIONS:  1. Keppra 1000 mg twice a day.  2. Neurontin 800 mg t.i.d.  3. Keflex 500 mg q.i.d.   ALLERGIES:  The patient has no known allergies.  Smokes a pipe.  Drinks  occasional beer or wine.   SOCIAL HISTORY:  This patient is single.  Lives in the [place], [place] area.  He is currently retired.  The patient has no children.   FAMILY MEDICAL  HISTORY:  Mother is alive and has a history of hypertension,  hypercholesterolemia.  Father died with a stroke in 37.  The patient has  one brother with diabetes, hypertension, heart disease.  A sister is alive  and well.   REVIEW OF SYSTEMS:  Notable for no recent fevers, chills.  The patient does  have a headache as above.  Denies any neck stiffness.  Has denied any  problems with shortness of breath, chest pains, nausea, vomiting.  The  patient does have some gait imbalance as above.  Denies any problems  controlling the bowels or the bladder.   PHYSICAL EXAMINATION:  VITAL SIGNS:  Blood pressure is 166/93, heart rate  92, respiratory rate 18, temperature afebrile.  GENERAL:  This patient is a markedly obese white male who is alert and  cooperative at the time of the examination.  HEENT:  Head is atraumatic.  Eyes - pupils equal, round and reactive to  light.  Disks are flat bilaterally.  NECK:  Supple.  No carotid bruits noted.  RESPIRATORY:  Clear.  CARDIOVASCULAR:  A regular rate and rhythm.  No obvious murmurs or rubs  noted.  ABDOMEN:  Positive bowel sounds.  Obese abdomen.  Nontender.  EXTREMITIES:  Reveal 3 to 4+ edema of the left lower extremity with chronic  induration of the leg below the knee.  Right leg reveals 2  to 3+ edema.  No  chronic skin changes.  Small skin tear noted on the pretibial area on the  left.  Some active bleeding noted.  NEUROLOGIC:  Cranial nerves as above.  Facial symmetry is present.  The  patient has good sensation of face to pinprick and soft touch bilaterally.  Has good strength of the facial muscles and the muscles of head turning and  shoulder shrug bilaterally.  Speech is well enunciated and not aphasic.  Again, extraocular movements are full.  Visual fields are full.  Motor  testing reveals good strength in all four extremities.  Good symmetric  motor, tone and bulk throughout.  Sensory testing reveals stocking glove  pinprick deficit, half to two-thirds the way up the legs bilaterally.  Vibratory sensation is moderately impaired on the right foot; more  significantly impaired on the left.  The patient has normal vibratory  sensation in the arms.  The patient has fair finger-to-nose, finger-to-toe-  to-finger bilaterally.  The patient was not ambulated.  The patient has no  drift seen, no asterixis.  Deep tendon reflexes are symmetric and normal.  Toes are neutral bilaterally.   LABORATORY DATA:  Lab values notable for a sodium of 136, potassium 4.3,  chloride 105, BUN 8, glucose 112, pH of 7.417, PCO2 of 25.9, bicarb of 16.7.  Hemoglobin was 16.3, hematocrit 48.0.  INR of 3.1.   EKG, chest x-ray, and CT of the head are pending.   IMPRESSION:  1. History of chronic seizure disorder with severe exacerbation at this     point.  2. Obesity.  3. Chronic venous insufficiency of the left leg.   This patient has recently been placed on Keflex.  It appears possible that  this may have activated the seizure somewhat, but to is clearly have a  multitude of seizures at this time with one seizure about 2-5 minutes.  The  patient does not lose consciousness, is not having any respiratory  compromise with the seizures.  Will bring this patient for further evaluation.    PLAN:  1. Admission to [facility]  3000 unit.  2. CT of the head.  3. Seizure precautions.  4. EEG study.  5. One gram load of IV Depacon now.  Will not give a maintenance dose of     this medication at this point.  6. Increase Keppra to 1250 mg p.o. b.i.d. for a couple days, and they go to     1500 mg twice a day.  7. Will continue Neurontin 800 mg q.8h.  8. The patient will get IV Ativan 2 mg for any generalized seizure.  9. Will get the skin care nurse to evaluate this patient for the leg.  10.      I will follow the patient's clinical course while in house.  May     consider an MRI scan of the brain if seizures persist.                                                C. Lesia Sago, M.D.    CKW/MEDQ  D:  05/07/2003  T:  05/08/2003  Job:  [id number]   cc:   [facility]  [address]  [address]

## 2011-02-10 LAB — CBC
HCT: 47.4
Hemoglobin: 16.1
MCHC: 34
MCV: 95.5
Platelets: 168
RDW: 13.6

## 2011-02-10 LAB — COMPREHENSIVE METABOLIC PANEL
Albumin: 3.9
Alkaline Phosphatase: 97
BUN: 7
Calcium: 8.5
Creatinine, Ser: 0.82
Glucose, Bld: 91
Total Protein: 6.6

## 2011-02-10 LAB — DIFFERENTIAL
Basophils Relative: 1
Lymphocytes Relative: 21
Monocytes Absolute: 0.8
Monocytes Relative: 10
Neutro Abs: 5.1
Neutrophils Relative %: 65

## 2011-02-10 LAB — URINALYSIS, ROUTINE W REFLEX MICROSCOPIC
Glucose, UA: NEGATIVE
Ketones, ur: 15 — AB
Nitrite: NEGATIVE
Protein, ur: NEGATIVE
pH: 6

## 2011-02-10 LAB — PROTIME-INR: INR: 6.2

## 2011-02-14 ENCOUNTER — Emergency Department (HOSPITAL_COMMUNITY)
Admission: EM | Admit: 2011-02-14 | Discharge: 2011-02-15 | Disposition: A | Discharge status: Home / Self Care | Payer: 59 | Attending: Emergency Medicine | Admitting: Emergency Medicine

## 2011-02-14 DIAGNOSIS — K6289 Other specified diseases of anus and rectum: Secondary | ICD-10-CM | POA: Insufficient documentation

## 2011-02-14 DIAGNOSIS — K649 Unspecified hemorrhoids: Secondary | ICD-10-CM | POA: Insufficient documentation

## 2011-02-14 DIAGNOSIS — Z7901 Long term (current) use of anticoagulants: Secondary | ICD-10-CM | POA: Insufficient documentation

## 2011-02-14 DIAGNOSIS — K5900 Constipation, unspecified: Secondary | ICD-10-CM | POA: Insufficient documentation

## 2011-02-14 DIAGNOSIS — Z86718 Personal history of other venous thrombosis and embolism: Secondary | ICD-10-CM | POA: Insufficient documentation

## 2011-02-14 DIAGNOSIS — G40909 Epilepsy, unspecified, not intractable, without status epilepticus: Secondary | ICD-10-CM | POA: Insufficient documentation

## 2011-02-14 DIAGNOSIS — G609 Hereditary and idiopathic neuropathy, unspecified: Secondary | ICD-10-CM | POA: Insufficient documentation

## 2011-02-15 LAB — OCCULT BLOOD, POC DEVICE: Fecal Occult Bld: NEGATIVE

## 2011-07-03 ENCOUNTER — Other Ambulatory Visit: Payer: Self-pay | Admitting: Neurology

## 2011-07-03 DIAGNOSIS — G40109 Localization-related (focal) (partial) symptomatic epilepsy and epileptic syndromes with simple partial seizures, not intractable, without status epilepticus: Secondary | ICD-10-CM

## 2011-07-03 DIAGNOSIS — G63 Polyneuropathy in diseases classified elsewhere: Secondary | ICD-10-CM

## 2011-07-03 DIAGNOSIS — G40119 Localization-related (focal) (partial) symptomatic epilepsy and epileptic syndromes with simple partial seizures, intractable, without status epilepticus: Secondary | ICD-10-CM

## 2011-07-03 DIAGNOSIS — Z79899 Other long term (current) drug therapy: Secondary | ICD-10-CM

## 2011-07-16 ENCOUNTER — Ambulatory Visit
Admission: RE | Admit: 2011-07-16 | Discharge: 2011-07-16 | Disposition: A | Discharge status: Home / Self Care | Payer: 59 | Source: Ambulatory Visit | Attending: Neurology | Admitting: Neurology

## 2011-07-16 DIAGNOSIS — Z79899 Other long term (current) drug therapy: Secondary | ICD-10-CM

## 2011-07-16 DIAGNOSIS — G40109 Localization-related (focal) (partial) symptomatic epilepsy and epileptic syndromes with simple partial seizures, not intractable, without status epilepticus: Secondary | ICD-10-CM

## 2011-07-16 DIAGNOSIS — G63 Polyneuropathy in diseases classified elsewhere: Secondary | ICD-10-CM

## 2011-07-16 DIAGNOSIS — G40119 Localization-related (focal) (partial) symptomatic epilepsy and epileptic syndromes with simple partial seizures, intractable, without status epilepticus: Secondary | ICD-10-CM

## 2012-03-23 ENCOUNTER — Other Ambulatory Visit: Payer: Self-pay | Admitting: Gastroenterology

## 2012-03-26 ENCOUNTER — Encounter (HOSPITAL_COMMUNITY): Payer: Self-pay

## 2012-03-26 ENCOUNTER — Encounter (HOSPITAL_COMMUNITY)
Admission: RE | Disposition: A | Discharge status: Home / Self Care | Payer: Self-pay | Source: Ambulatory Visit | Attending: Gastroenterology

## 2012-03-26 ENCOUNTER — Ambulatory Visit (HOSPITAL_COMMUNITY)
Admission: RE | Admit: 2012-03-26 | Discharge: 2012-03-26 | Disposition: A | Discharge status: Home / Self Care | Payer: 59 | Source: Ambulatory Visit | Attending: Gastroenterology | Admitting: Gastroenterology

## 2012-03-26 DIAGNOSIS — K644 Residual hemorrhoidal skin tags: Secondary | ICD-10-CM | POA: Insufficient documentation

## 2012-03-26 DIAGNOSIS — G609 Hereditary and idiopathic neuropathy, unspecified: Secondary | ICD-10-CM | POA: Insufficient documentation

## 2012-03-26 DIAGNOSIS — I739 Peripheral vascular disease, unspecified: Secondary | ICD-10-CM | POA: Insufficient documentation

## 2012-03-26 DIAGNOSIS — D126 Benign neoplasm of colon, unspecified: Secondary | ICD-10-CM | POA: Insufficient documentation

## 2012-03-26 DIAGNOSIS — I2510 Atherosclerotic heart disease of native coronary artery without angina pectoris: Secondary | ICD-10-CM | POA: Insufficient documentation

## 2012-03-26 DIAGNOSIS — K648 Other hemorrhoids: Secondary | ICD-10-CM | POA: Insufficient documentation

## 2012-03-26 DIAGNOSIS — Z86718 Personal history of other venous thrombosis and embolism: Secondary | ICD-10-CM | POA: Insufficient documentation

## 2012-03-26 HISTORY — DX: Psoriasis, unspecified: L40.9

## 2012-03-26 HISTORY — PX: COLONOSCOPY: SHX5424

## 2012-03-26 HISTORY — DX: Unspecified osteoarthritis, unspecified site: M19.90

## 2012-03-26 HISTORY — DX: Phlebitis and thrombophlebitis of unspecified deep vessels of left lower extremity: I80.202

## 2012-03-26 HISTORY — DX: Polyneuropathy, unspecified: G62.9

## 2012-03-26 HISTORY — DX: Depression, unspecified: F32.A

## 2012-03-26 HISTORY — DX: Unspecified convulsions: R56.9

## 2012-03-26 HISTORY — DX: Acute embolism and thrombosis of unspecified deep veins of right lower extremity: I82.401

## 2012-03-26 HISTORY — DX: Acute embolism and thrombosis of unspecified deep veins of unspecified lower extremity: I82.409

## 2012-03-26 HISTORY — DX: Major depressive disorder, single episode, unspecified: F32.9

## 2012-03-26 HISTORY — DX: Other symptoms and signs involving the musculoskeletal system: R29.898

## 2012-03-26 HISTORY — DX: Other specified disorders of veins: I87.8

## 2012-03-26 SURGERY — COLONOSCOPY
Anesthesia: Moderate Sedation

## 2012-03-26 MED ORDER — MIDAZOLAM HCL 10 MG/2ML IJ SOLN
INTRAMUSCULAR | Status: AC
  Filled 2012-03-26: qty 4

## 2012-03-26 MED ORDER — DIPHENHYDRAMINE HCL 50 MG/ML IJ SOLN
INTRAMUSCULAR | Status: AC
  Filled 2012-03-26: qty 1

## 2012-03-26 MED ORDER — FENTANYL CITRATE 0.05 MG/ML IJ SOLN
INTRAMUSCULAR | Status: AC
  Filled 2012-03-26: qty 4

## 2012-03-26 MED ORDER — SODIUM CHLORIDE 0.9 % IV SOLN: INTRAVENOUS | Status: DC

## 2012-03-26 MED ORDER — MIDAZOLAM HCL 10 MG/2ML IJ SOLN
INTRAMUSCULAR | Status: DC | PRN
  Administered 2012-03-26: 2 mg via INTRAVENOUS
  Administered 2012-03-26: 1 mg via INTRAVENOUS
  Administered 2012-03-26: 2 mg via INTRAVENOUS

## 2012-03-26 MED ORDER — FENTANYL CITRATE 0.05 MG/ML IJ SOLN
INTRAMUSCULAR | Status: DC | PRN
  Administered 2012-03-26 (×3): 25 ug via INTRAVENOUS

## 2012-03-26 MED ORDER — SODIUM CHLORIDE 0.9 % IV SOLN
INTRAVENOUS | Status: DC
  Administered 2012-03-26: 500 mL via INTRAVENOUS

## 2012-03-26 NOTE — Op Note (Signed)
[facility] [address] [place], [address]   OPERATIVE PROCEDURE REPORT  PATIENT: [name], [name]  MR#: [id number] BIRTHDATE: 23-Mar-1951  GENDER: Male ENDOSCOPIST: Jeani Hawking, MD ASSISTANT:   Kandice Robinsons, technician Angelique Blonder, RN PROCEDURE DATE: 03/26/2012 PROCEDURE:   Colonoscopy with snare polypectomy ASA CLASS:   Class III INDICATIONS:Personal history of polyps. MEDICATIONS: Versed 7 mg IV and Fentanyl 75 mcg IV  DESCRIPTION OF PROCEDURE:   After the risks benefits and alternatives of the procedure were thoroughly explained, informed consent was obtained.  A digital rectal exam revealed no abnormalities of the rectum.    The Pentax Colonoscope [id number] endoscope was introduced through the anus  and advanced to the cecum, which was identified by both the appendix and ileocecal valve , No adverse events experienced.    The quality of the prep was good. .  The instrument was then slowly withdrawn as the colon was fully examined.     FINDINGS: A 4 mm sessile ascending colon polyp was removed with a cold snare.  A 3 mm sessile descending colon polyp was removed with a cold nsare.  A 5-6 mm sessile sigmoid colon polyp was removed with a cold snare.  No other abnormalities identified. Retroflexed views revealed internal/external hemorrhoids.     The scope was then withdrawn from the patient and the procedure terminated.  COMPLICATIONS: There were no complications.  IMPRESSION: 1) Polyps. 2) Hemorrhoids.  RECOMMENDATIONS: 1) Await biopsy results. 2) Repeat the colonoscopy in 5 years.   _______________________________ Rosalie DoctorJeani Hawking, MD 03/26/2012 10:09 AM

## 2012-03-26 NOTE — H&P (Signed)
  Reason for Consult: Personal history of colon polyps Referring Physician: Georgianne Fick, M.D.  [name] HPI: This is a 61 year old male with a prior history of colonic polyps.  The polyps were tubular adenomas in histology and his prior colonoscopies were in 2003 and 2008.  Past Medical History  Diagnosis Date  . Coronary artery disease   . Depression   . Peripheral vascular disease   . Seizures   . Deep vein thrombophlebitis of left leg   . Deep vein thrombosis of right lower extremity   . Psoriasis   . Arthritis   . DVT (deep venous thrombosis)     multiple times  . Weakness of both legs   . Peripheral neuropathy   . Venous stasis     History reviewed. No pertinent past surgical history.  History reviewed. No pertinent family history.  Social History:  reports that he quit smoking about 30 years ago. His smoking use included Pipe. He does not have any smokeless tobacco history on file. He reports that he does not drink alcohol. His drug history not on file.  Allergies:  Allergies  Allergen Reactions  . Penicillins     Mother and brother have had severe allergic reactions, no personal reaction    Medications:  Scheduled:  Continuous:   . sodium chloride 500 mL (03/26/12 0916)  . sodium chloride      No results found for this or any previous visit (from the past 24 hour(s)).   No results found.  ROS:  As stated above in the HPI otherwise negative.  Blood pressure 128/104, pulse 73, temperature 97.9 F (36.6 C), temperature source Oral, resp. rate 18, height 6\' 5"  (1.956 m), weight 115.667 kg (255 lb).    PE: Gen: NAD, Alert and Oriented HEENT:  [place]/AT, EOMI Neck: Supple, no LAD Lungs: CTA Bilaterally CV: RRR without M/G/R ABM: Soft, NTND, +BS Ext: No C/C/E  Assessment/Plan: 1) Personal history of colon polyps.  Plan: 1) Colonoscopy today.  Thamas Appleyard D 03/26/2012, 9:15 AM

## 2012-03-29 ENCOUNTER — Encounter (HOSPITAL_COMMUNITY): Payer: Self-pay | Admitting: Gastroenterology

## 2012-03-29 ENCOUNTER — Encounter (HOSPITAL_COMMUNITY): Payer: Self-pay

## 2012-08-27 ENCOUNTER — Other Ambulatory Visit: Payer: Self-pay

## 2012-08-27 MED ORDER — PHENYTOIN SODIUM EXTENDED 100 MG PO CAPS: ORAL_CAPSULE | ORAL | Status: DC

## 2012-08-27 MED ORDER — LEVETIRACETAM 750 MG PO TABS: 1500.0000 mg | ORAL_TABLET | Freq: Three times a day (TID) | ORAL | Status: DC

## 2012-08-27 NOTE — Telephone Encounter (Signed)
Former [facility] Patient Has Not Been Transport planner.  Dr Eulah Citizen

## 2012-09-21 ENCOUNTER — Encounter (INDEPENDENT_AMBULATORY_CARE_PROVIDER_SITE_OTHER): Payer: Self-pay

## 2012-10-06 ENCOUNTER — Encounter (INDEPENDENT_AMBULATORY_CARE_PROVIDER_SITE_OTHER): Payer: Self-pay | Admitting: General Surgery

## 2012-10-06 ENCOUNTER — Ambulatory Visit (INDEPENDENT_AMBULATORY_CARE_PROVIDER_SITE_OTHER): Payer: 59 | Admitting: General Surgery

## 2012-10-06 VITALS — BP 126/78 | HR 66 | Temp 98.5°F | Resp 18 | Ht 77.0 in | Wt 259.0 lb

## 2012-10-06 DIAGNOSIS — K644 Residual hemorrhoidal skin tags: Secondary | ICD-10-CM

## 2012-10-06 DIAGNOSIS — K648 Other hemorrhoids: Secondary | ICD-10-CM | POA: Insufficient documentation

## 2012-10-06 MED ORDER — HYDROCODONE-ACETAMINOPHEN 10-325 MG PO TABS: 1.0000 | ORAL_TABLET | Freq: Four times a day (QID) | ORAL | Status: DC | PRN

## 2012-10-06 NOTE — Progress Notes (Signed)
Patient ID: [name], male   DOB: November 01, 1950, 62 y.o.   MRN: [id number]  Chief Complaint  Patient presents with  . New Evaluation    hems    HPI [name] is a 62 y.o. male.  Chief complaint: Internal and external hemorrhoids. HPI I was asked by Dr. Nicholos Johns to see him in consultation regarding hemorrhoids. He's had these for some time. He has not had bleeding per rectum but he does have pain. He had a colonoscopy by Dr. Audley Hose last fall with some benign polyps and internal hemorrhoids noted. He takes Colace for constipation. He feels incomplete Elimination after many bowel movements. He denies noticing prolapse of any tissue. Past Medical History  Diagnosis Date  . Coronary artery disease   . Depression   . Peripheral vascular disease   . Seizures   . Deep vein thrombophlebitis of left leg   . Deep vein thrombosis of right lower extremity   . Psoriasis   . Arthritis   . DVT (deep venous thrombosis)     multiple times  . Weakness of both legs   . Peripheral neuropathy   . Venous stasis     Past Surgical History  Procedure Laterality Date  . Colonoscopy  03/26/2012    Procedure: COLONOSCOPY;  Surgeon: Theda Belfast, MD;  Location: [facility] ENDOSCOPY;  Service: Endoscopy;  Laterality: N/A;  . Tonsillectomy      as child    Family History  Problem Relation Age of Onset  . Vision loss Father   . Hypertension Father   . Diabetes Father   . Heart disease Father   . Cancer Father     colon  . Diabetes Mother   . Hypertension Mother   . Heart disease Mother   . Dementia Mother   . Cancer Mother     breast ca  . Diabetes Sister   . Hyperlipidemia Sister   . Hypertension Sister   . Diabetes Brother   . Kidney disease Brother   . Heart disease Brother     Social History History  Substance Use Topics  . Smoking status: Former Smoker    Types: Pipe    Quit date: 03/26/1982  . Smokeless tobacco: Not on file  . Alcohol Use: No    Allergies  Allergen  Reactions  . Penicillins     Mother and brother have had severe allergic reactions, no personal reaction    Current Outpatient Prescriptions  Medication Sig Dispense Refill  . acetaminophen (TYLENOL) 500 MG tablet Take 500 mg by mouth 2 (two) times daily as needed.      . calcium carbonate (OS-CAL) 600 MG TABS Take 600 mg by mouth daily.      Marland Kitchen docusate sodium (COLACE) 100 MG capsule Take 100 mg by mouth 2 (two) times daily as needed.      . levETIRAcetam (KEPPRA) 750 MG tablet Take 2 tablets (1,500 mg total) by mouth 3 (three) times daily.  540 tablet  1  . Multiple Vitamin (MULTIVITAMIN WITH MINERALS) TABS Take 1 tablet by mouth daily.      . phenytoin (DILANTIN) 100 MG ER capsule One po TID on Monday, Wednesday and Friday and one QID on Tuesday, Thursday, Saturday and Sunday BRAND MEDICALLY NECESSARY  335 capsule  1  . PROCTOSOL HC 2.5 % rectal cream       . triamcinolone ointment (KENALOG) 0.1 % Apply topically 2 (two) times daily.      . vitamin B-12 (  CYANOCOBALAMIN) 1000 MCG tablet Take 1,000 mcg by mouth daily.      Marland Kitchen warfarin (COUMADIN) 2.5 MG tablet Take 2.5 mg by mouth daily. Mon., Wed., & Sat.      . warfarin (COUMADIN) 5 MG tablet Take 5 mg by mouth daily. 5 mg on Sunday, Tuesday Thurs and 2.5 on Mon, Wed., & Sat.      Marland Kitchen HYDROcodone-acetaminophen (NORCO) 10-325 MG per tablet Take 1 tablet by mouth every 6 (six) hours as needed for pain.  20 tablet  0   No current facility-administered medications for this visit.    Review of Systems Review of Systems  Constitutional: Negative for fever, chills and unexpected weight change.  HENT: Negative for hearing loss, congestion, sore throat, trouble swallowing and voice change.   Eyes: Negative for visual disturbance.  Respiratory: Negative for cough and wheezing.   Cardiovascular: Negative for chest pain, palpitations and leg swelling.  Gastrointestinal: Positive for constipation and rectal pain. Negative for nausea, vomiting,  abdominal pain, diarrhea, blood in stool, abdominal distention and anal bleeding.  Genitourinary: Negative for hematuria and difficulty urinating.  Musculoskeletal: Negative for arthralgias.       Walks with a walker  Skin: Negative for rash and wound.  Neurological: Negative for seizures, syncope, weakness and headaches.  Hematological: Negative for adenopathy. Does not bruise/bleed easily.  Psychiatric/Behavioral: Negative for confusion.    Blood pressure 126/78, pulse 66, temperature 98.5 F (36.9 C), resp. rate 18, height 6\' 5"  (1.956 m), weight 259 lb (117.482 kg).  Physical Exam Physical Exam  Constitutional: He is oriented to person, place, and time. He appears well-developed. No distress.  HENT:  Head: Normocephalic and atraumatic.  nicotine staining to mustache and beard  Eyes: EOM are normal. Pupils are equal, round, and reactive to light.  Neck: Normal range of motion. No tracheal deviation present.  Cardiovascular: Normal rate, regular rhythm and normal heart sounds.   No murmur heard. Pulmonary/Chest: Effort normal and breath sounds normal. No stridor. No respiratory distress. He has no wheezes. He has no rales.  Abdominal: Soft. He exhibits no distension. There is no tenderness. There is no rebound.  Genitourinary:  External anal exam reveals circumferential small external hemorrhoids and anal skin tags without thrombosis or acute inflammation, digital rectal exam reveals palpable large internal hemorrhoids, endoscopy was then done revealing large internal hemorrhoid columns at the posterior and right lateral region, and band was placed on each end he tolerated this well without pain or bleeding.  Musculoskeletal:  Right lower extremity brace, walks with the assistance of walker  Neurological: He is alert and oriented to person, place, and time.  Skin: Skin is warm and dry.    Data Reviewed Notes from Dr. Nicholos Johns  Assessment    Internal hemorrhoids, external  hemorrhoids, anal skin tags    Plan    Banding of internal hemorrhoids as above, continue Colace, increase water intake to avoid constipation, return in 6 weeks for followup. Further banding may be needed. Plan was discussed in detail the patient and his sister.       [name] 10/06/2012, 9:50 AM

## 2012-10-06 NOTE — Patient Instructions (Signed)
Drink plenty of water. Take Colace daily.

## 2012-10-15 ENCOUNTER — Telehealth (INDEPENDENT_AMBULATORY_CARE_PROVIDER_SITE_OTHER): Payer: Self-pay

## 2012-10-15 ENCOUNTER — Encounter (INDEPENDENT_AMBULATORY_CARE_PROVIDER_SITE_OTHER): Payer: Self-pay | Admitting: General Surgery

## 2012-10-15 ENCOUNTER — Encounter (HOSPITAL_COMMUNITY): Payer: Self-pay | Admitting: *Deleted

## 2012-10-15 ENCOUNTER — Emergency Department (HOSPITAL_COMMUNITY)
Admission: EM | Admit: 2012-10-15 | Discharge: 2012-10-15 | Disposition: A | Discharge status: Home / Self Care | Payer: 59 | Attending: Emergency Medicine | Admitting: Emergency Medicine

## 2012-10-15 DIAGNOSIS — K6289 Other specified diseases of anus and rectum: Secondary | ICD-10-CM

## 2012-10-15 DIAGNOSIS — K5909 Other constipation: Secondary | ICD-10-CM | POA: Insufficient documentation

## 2012-10-15 DIAGNOSIS — Z79899 Other long term (current) drug therapy: Secondary | ICD-10-CM | POA: Insufficient documentation

## 2012-10-15 DIAGNOSIS — I2510 Atherosclerotic heart disease of native coronary artery without angina pectoris: Secondary | ICD-10-CM | POA: Insufficient documentation

## 2012-10-15 DIAGNOSIS — Z8659 Personal history of other mental and behavioral disorders: Secondary | ICD-10-CM | POA: Insufficient documentation

## 2012-10-15 DIAGNOSIS — IMO0002 Reserved for concepts with insufficient information to code with codable children: Secondary | ICD-10-CM | POA: Insufficient documentation

## 2012-10-15 DIAGNOSIS — L408 Other psoriasis: Secondary | ICD-10-CM | POA: Insufficient documentation

## 2012-10-15 DIAGNOSIS — G40909 Epilepsy, unspecified, not intractable, without status epilepticus: Secondary | ICD-10-CM | POA: Insufficient documentation

## 2012-10-15 DIAGNOSIS — Z09 Encounter for follow-up examination after completed treatment for conditions other than malignant neoplasm: Secondary | ICD-10-CM | POA: Insufficient documentation

## 2012-10-15 DIAGNOSIS — Z86718 Personal history of other venous thrombosis and embolism: Secondary | ICD-10-CM | POA: Insufficient documentation

## 2012-10-15 DIAGNOSIS — Z880 Allergy status to penicillin: Secondary | ICD-10-CM | POA: Insufficient documentation

## 2012-10-15 DIAGNOSIS — Z8679 Personal history of other diseases of the circulatory system: Secondary | ICD-10-CM | POA: Insufficient documentation

## 2012-10-15 DIAGNOSIS — K5900 Constipation, unspecified: Secondary | ICD-10-CM

## 2012-10-15 MED ORDER — OXYCODONE-ACETAMINOPHEN 5-325 MG PO TABS
1.0000 | ORAL_TABLET | Freq: Once | ORAL | Status: AC
  Administered 2012-10-15: 1 via ORAL
  Filled 2012-10-15: qty 1

## 2012-10-15 MED ORDER — FLEET ENEMA 7-19 GM/118ML RE ENEM
1.0000 | ENEMA | Freq: Once | RECTAL | Status: AC
  Administered 2012-10-15: 1 via RECTAL
  Filled 2012-10-15: qty 1

## 2012-10-15 MED ORDER — POLYETHYLENE GLYCOL 3350 17 GM/SCOOP PO POWD: 17.0000 g | Freq: Every day | ORAL | Status: DC

## 2012-10-15 NOTE — ED Provider Notes (Signed)
According to our surgical colleagues, the patient is appropriate for discharge.  Gerhard Munch, MD 10/15/12 [phone]

## 2012-10-15 NOTE — Progress Notes (Signed)
Subjective: CC:  Ongoing rectal pain, and does not feel like he has relief after bowel movements. 62 y/o male with complaints of hemorrhoids seen by DR. Violeta Gelinas on 10/06/12. He has a hx of polyps and internal hemorrhoids, followed with colonoscopy by Dr. Elnoria Howard last year.  He complained of pain and incomplete feeling after bowel movements.  He underwent banding on the same date in our office.   He called today complaining of pain and was referred to the ED.  He says symptoms are as bad as before the banding along with chronic rectal pain and incomplete relief after BM.  He is on colace for constipation and continues to have issues with constipation.  He had banding of the large right internal hemorrhoid.  He was to continue colace, and follow up in 6 weeks.  Objective: Vital signs in last 24 hours: Temp:  [97.8 F (36.6 C)] 97.8 F (36.6 C) (06/06 1323) Pulse Rate:  [61] 61 (06/06 1323) Resp:  [16] 16 (06/06 1323) BP: (134)/(81) 134/81 mmHg (06/06 1323) SpO2:  [96 %] 96 % (06/06 1323)           General appearance: alert, cooperative and Mild discomfort. Rectal exam:  Dr. Biagio Quint did an anoscopy at the bedside.  He did not see the bands.  No other abnormality was seen.  Medications: Prior to Admission medications   Medication Sig Start Date End Date Taking? Authorizing Provider  acetaminophen (TYLENOL) 500 MG tablet Take 500 mg by mouth 2 (two) times daily as needed.   Yes Historical Provider, MD  calcium carbonate (OS-CAL) 600 MG TABS Take 600 mg by mouth daily.   Yes Historical Provider, MD  docusate sodium (COLACE) 100 MG capsule Take 100 mg by mouth 2 (two) times daily as needed.   Yes Historical Provider, MD  HYDROcodone-acetaminophen (NORCO) 10-325 MG per tablet Take 1 tablet by mouth every 6 (six) hours as needed for pain. 10/06/12 10/06/13 Yes Liz Malady, MD  levETIRAcetam (KEPPRA) 750 MG tablet Take 2 tablets (1,500 mg total) by mouth 3 (three) times daily. 08/27/12   Yes Levert Feinstein, MD  Multiple Vitamin (MULTIVITAMIN WITH MINERALS) TABS Take 1 tablet by mouth daily.   Yes Historical Provider, MD  phenytoin (DILANTIN) 100 MG ER capsule One po TID on Monday, Wednesday and Friday and one QID on Tuesday, Thursday, Saturday and Sunday BRAND MEDICALLY NECESSARY 08/27/12  Yes Levert Feinstein, MD  PROCTOSOL [facility] 2.5 % rectal cream  09/13/12  Yes Historical Provider, MD  triamcinolone ointment (KENALOG) 0.1 % Apply topically 2 (two) times daily.   Yes Historical Provider, MD  vitamin B-12 (CYANOCOBALAMIN) 1000 MCG tablet Take 1,000 mcg by mouth daily.   Yes Historical Provider, MD  warfarin (COUMADIN) 5 MG tablet Take 2.5-5 mg by mouth daily. Takes 2.5mg  on Monday, Wednesday and Saturday, 5mg  on Tuesday, Thursday,Friday and Sunday   Yes Historical Provider, MD  polyethylene glycol powder (MIRALAX) powder Take 17 g by mouth daily. 10/15/12   Lyanne Co, MD     Assessment/Plan  Pt was seen and examined by the ER physician Dr. Patria Mane who treated him with addition pain medicine, a laxative and enema.  He has had good results with that.  After exam Dr. Biagio Quint recommended he continue pain medicine as instructed, Colace, and fiber of choice a couple times per day.   The ER doctors will discharge him and I will ask our office to have him see Dr. Janee Morn in the next couple weeks.  LOS: 0 days    JENNINGS,[name] 10/15/2012  I have seen and evaluated him he had a larger external skin tag but no evidence of bleeding. Anoscopy performed and did not show any evidence of any postop complication or problem.  F/u with Dr. Janee Morn

## 2012-10-15 NOTE — ED Provider Notes (Signed)
History     CSN: [id number]  Arrival date & time 10/15/12  1313   First MD Initiated Contact with Patient 10/15/12 1319      Chief Complaint  Patient presents with  . rectal pain, hemrroid surgery last week      The history is provided by the patient.   patient reports internal hemorrhoid banding 5 days ago with the general surgeons.  He reports increasing rectal pain over the past 2-3 days.  He's tried Tylenol at home for the pain without improvement in his symptoms.  He has a prescription for Vicodin but he has not tried any because he is concerned about taking pain medicine that is too strong.  He does report some decreased bowel movements.  No nausea or vomiting.  No diarrhea.  No rectal bleeding.  No abdominal pain.  His symptoms are mild to moderate in severity.  Nothing worsens or improves his symptoms.  Symptoms are constant.  He called the Gen. surgery office today who recommended that he come to the [facility] long emergency department for evaluation  Past Medical History  Diagnosis Date  . Coronary artery disease   . Depression   . Peripheral vascular disease   . Seizures   . Deep vein thrombophlebitis of left leg   . Deep vein thrombosis of right lower extremity   . Psoriasis   . Arthritis   . DVT (deep venous thrombosis)     multiple times  . Weakness of both legs   . Peripheral neuropathy   . Venous stasis     Past Surgical History  Procedure Laterality Date  . Colonoscopy  03/26/2012    Procedure: COLONOSCOPY;  Surgeon: Theda Belfast, MD;  Location: [facility];  Service: Endoscopy;  Laterality: N/A;  . Tonsillectomy      as child    Family History  Problem Relation Age of Onset  . Vision loss Father   . Hypertension Father   . Diabetes Father   . Heart disease Father   . Cancer Father     colon  . Diabetes Mother   . Hypertension Mother   . Heart disease Mother   . Dementia Mother   . Cancer Mother     breast ca  . Diabetes Sister   .  Hyperlipidemia Sister   . Hypertension Sister   . Diabetes Brother   . Kidney disease Brother   . Heart disease Brother     History  Substance Use Topics  . Smoking status: Former Smoker    Types: Pipe    Quit date: 03/26/1982  . Smokeless tobacco: Not on file  . Alcohol Use: No      Review of Systems  All other systems reviewed and are negative.    Allergies  Penicillins  Home Medications   Current Outpatient Rx  Name  Route  Sig  Dispense  Refill  . acetaminophen (TYLENOL) 500 MG tablet   Oral   Take 500 mg by mouth 2 (two) times daily as needed.         . calcium carbonate (OS-CAL) 600 MG TABS   Oral   Take 600 mg by mouth daily.         Marland Kitchen docusate sodium (COLACE) 100 MG capsule   Oral   Take 100 mg by mouth 2 (two) times daily as needed.         Marland Kitchen HYDROcodone-acetaminophen (NORCO) 10-325 MG per tablet   Oral   Take 1 tablet  by mouth every 6 (six) hours as needed for pain.   20 tablet   0   . levETIRAcetam (KEPPRA) 750 MG tablet   Oral   Take 2 tablets (1,500 mg total) by mouth 3 (three) times daily.   540 tablet   1   . Multiple Vitamin (MULTIVITAMIN WITH MINERALS) TABS   Oral   Take 1 tablet by mouth daily.         . phenytoin (DILANTIN) 100 MG ER capsule      One po TID on Monday, Wednesday and Friday and one QID on Tuesday, Thursday, Saturday and Sunday BRAND MEDICALLY NECESSARY   335 capsule   1     Dispense as written.   Marland Kitchen PROCTOSOL HC 2.5 % rectal cream               . triamcinolone ointment (KENALOG) 0.1 %   Topical   Apply topically 2 (two) times daily.         . vitamin B-12 (CYANOCOBALAMIN) 1000 MCG tablet   Oral   Take 1,000 mcg by mouth daily.         Marland Kitchen warfarin (COUMADIN) 5 MG tablet   Oral   Take 2.5-5 mg by mouth daily. Takes 2.5mg  on Monday, Wednesday and Saturday, 5mg  on Tuesday, Thursday,Friday and Sunday           BP 134/81  Pulse 61  Temp(Src) 97.8 F (36.6 C) (Oral)  Resp 16  SpO2  96%  Physical Exam  Nursing note and vitals reviewed. Constitutional: He is oriented to person, place, and time. He appears well-developed and well-nourished.  HENT:  Head: Normocephalic and atraumatic.  Eyes: EOM are normal.  Neck: Normal range of motion.  Cardiovascular: Normal rate, regular rhythm, normal heart sounds and intact distal pulses.   Pulmonary/Chest: Effort normal and breath sounds normal. No respiratory distress.  Abdominal: Soft. He exhibits no distension. There is no tenderness.  Genitourinary: Rectum normal.  No significant rectal tenderness on exam.  Small amount of fecal impaction in his proximal rectum.  No gross blood  Musculoskeletal: Normal range of motion.  Neurological: He is alert and oriented to person, place, and time.  Skin: Skin is warm and dry.  Psychiatric: He has a normal mood and affect. Judgment normal.    ED Course  Procedures (including critical care time)  Labs Reviewed - No data to display No results found.   1. Rectal pain   2. Constipation       MDM  Attempted enema in the emergency department as I believe the patient may be slightly constipated.  No success with this.  Unable to disimpact the patient.  No gross blood.  I talked with general surgery who will evaluate the patient the bedside for postoperative complication.  Low suspicion for infection.  No indication for imaging or labs at this time         Lyanne Co, MD 10/18/12 1113

## 2012-10-15 NOTE — ED Notes (Signed)
Pt reports that he passed enema, but no stool.  RN and MD notified.

## 2012-10-15 NOTE — ED Notes (Signed)
Visible small ball like sacks on the external area of rectum, painful to touch. Pt states that doctor placed rubber bands on internal hemorrhoids on May 28. 2014. Pt no aware if rubber bands have come off.

## 2012-10-15 NOTE — ED Notes (Addendum)
Pt reports surgery/ tie off/banding of internal hemorroids last week Dr Janee Morn surgeon. Pt reports rectal pain x 2-3 days, today pain increased 10/10. No rectal bleeding at this time.   Hx of DVT, on coumadin, has not taken coumadin dose today.

## 2012-10-15 NOTE — Telephone Encounter (Signed)
Pt called c/o pain from internal hem banding done 10/06/12.  Patient states in a 10 out of 10 on pain scale.  No bleeding from rectum, having soft BMs.  Would like to be checked.  Per Dr. Derrell Lolling patient instructed to go to the ER.  Patient agrees with plan.

## 2012-10-15 NOTE — H&P (Signed)
Subjective:  CC: Ongoing rectal pain, and does not feel like he has relief after bowel movements.  62 y/o male with complaints of hemorrhoids seen by DR. Violeta Gelinas on 10/06/12. He has a hx of polyps and internal hemorrhoids, followed with colonoscopy by Dr. Elnoria Howard last year. He complained of pain and incomplete feeling after bowel movements. He underwent banding on the same date in our office.  He called today complaining of pain and was referred to the ED. He says symptoms are as bad as before the banding along with chronic rectal pain and incomplete relief after BM. He is on colace for constipation and continues to have issues with constipation. He had banding of the large right internal hemorrhoid. He was to continue colace, and follow up in 6 weeks.  Objective:  Vital signs in last 24 hours:  Temp: [97.8 F (36.6 C)] 97.8 F (36.6 C) (06/06 1323)  Pulse Rate: [61] 61 (06/06 1323)  Resp: [16] 16 (06/06 1323)  BP: (134)/(81) 134/81 mmHg (06/06 1323)  SpO2: [96 %] 96 % (06/06 1323)  General appearance: alert, cooperative and Mild discomfort.  Rectal exam: Dr. Biagio Quint did an anoscopy at the bedside. He did not see the bands. No other abnormality was seen.  Medications:  Prior to Admission medications   Medication  Sig  Start Date  End Date  Taking?  Authorizing Provider   acetaminophen (TYLENOL) 500 MG tablet  Take 500 mg by mouth 2 (two) times daily as needed.    Yes  Historical Provider, MD   calcium carbonate (OS-CAL) 600 MG TABS  Take 600 mg by mouth daily.    Yes  Historical Provider, MD   docusate sodium (COLACE) 100 MG capsule  Take 100 mg by mouth 2 (two) times daily as needed.    Yes  Historical Provider, MD   HYDROcodone-acetaminophen (NORCO) 10-325 MG per tablet  Take 1 tablet by mouth every 6 (six) hours as needed for pain.  10/06/12  10/06/13  Yes  Liz Malady, MD   levETIRAcetam (KEPPRA) 750 MG tablet  Take 2 tablets (1,500 mg total) by mouth 3 (three) times daily.  08/27/12    Yes  Levert Feinstein, MD   Multiple Vitamin (MULTIVITAMIN WITH MINERALS) TABS  Take 1 tablet by mouth daily.    Yes  Historical Provider, MD   phenytoin (DILANTIN) 100 MG ER capsule  One po TID on Monday, Wednesday and Friday and one QID on Tuesday, Thursday, Saturday and Sunday BRAND MEDICALLY NECESSARY  08/27/12   Yes  Levert Feinstein, MD   PROCTOSOL [facility] 2.5 % rectal cream   09/13/12   Yes  Historical Provider, MD   triamcinolone ointment (KENALOG) 0.1 %  Apply topically 2 (two) times daily.    Yes  Historical Provider, MD   vitamin B-12 (CYANOCOBALAMIN) 1000 MCG tablet  Take 1,000 mcg by mouth daily.    Yes  Historical Provider, MD   warfarin (COUMADIN) 5 MG tablet  Take 2.5-5 mg by mouth daily. Takes 2.5mg  on Monday, Wednesday and Saturday, 5mg  on Tuesday, Thursday,Friday and Sunday    Yes  Historical Provider, MD   polyethylene glycol powder (MIRALAX) powder  Take 17 g by mouth daily.  10/15/12    Lyanne Co, MD   Assessment/Plan  Pt was seen and examined by the ER physician Dr. Patria Mane who treated him with addition pain medicine, a laxative and enema. He has had good results with that.  After exam Dr. Biagio Quint recommended he  continue pain medicine as instructed, Colace, and fiber of choice a couple times per day.  The ER doctors will discharge him and I will ask our office to have him see Dr. Janee Morn in the next couple weeks.   LOS: 0 days  JENNINGS,WILLARD  10/15/2012    I was present with the patient for the history and examination.  He has pain and tenesmus but is only taking tylenol.  He does not look uncomfortable and denies bleeding.  He has some stool staining and external skin.  Larger skin tag on the right.  I performed anoscopy and he did have cicumferential internal hemorrhoids.  I did not see the banding.  There was no bleeding or mass identified.  DRE did not show mass as well.  I do not see any evidence of complication currently.  I recommended that he increase fiber and water and use the stool  softeners and pain meds as prescribed.  I do not think that he needs admission or any other procedure right now.  I recommended that he call the office asap to see if Dr. Janee Morn can see him sooner to ensure that everything looks okay.

## 2012-10-18 ENCOUNTER — Other Ambulatory Visit (INDEPENDENT_AMBULATORY_CARE_PROVIDER_SITE_OTHER): Payer: Self-pay | Admitting: General Surgery

## 2012-10-18 NOTE — Telephone Encounter (Signed)
Patient was seen in the ED my one of my partners this weekend

## 2012-10-25 ENCOUNTER — Other Ambulatory Visit (INDEPENDENT_AMBULATORY_CARE_PROVIDER_SITE_OTHER): Payer: Self-pay | Admitting: General Surgery

## 2012-11-17 ENCOUNTER — Encounter (INDEPENDENT_AMBULATORY_CARE_PROVIDER_SITE_OTHER): Payer: Self-pay | Admitting: General Surgery

## 2012-11-17 ENCOUNTER — Ambulatory Visit (INDEPENDENT_AMBULATORY_CARE_PROVIDER_SITE_OTHER): Payer: 59 | Admitting: General Surgery

## 2012-11-17 VITALS — BP 152/96 | HR 70 | Temp 97.9°F | Resp 16 | Ht 77.0 in | Wt 259.0 lb

## 2012-11-17 DIAGNOSIS — K602 Anal fissure, unspecified: Secondary | ICD-10-CM | POA: Insufficient documentation

## 2012-11-17 DIAGNOSIS — K648 Other hemorrhoids: Secondary | ICD-10-CM

## 2012-11-17 DIAGNOSIS — K644 Residual hemorrhoidal skin tags: Secondary | ICD-10-CM

## 2012-11-17 NOTE — Progress Notes (Signed)
Subjective:     Patient ID: [name], male   DOB: 1951/03/06, 62 y.o.   MRN: [id number]  HPI  Patient presents status post banding of internal hemorrhoids. He is still having intermittent pain associated with his hemorrhoids. He is having no bleeding. The pain mostly comes when he sits on the toilet and the period of time thereafter. It is described as more of a painful spasm of the sphincter. He is having more frequent bowel movements than previously as well. Review of Systems     Objective:   Physical Exam  Constitutional: He is oriented to person, place, and time. He appears well-developed.  HENT:  Head: Normocephalic.  Mouth/Throat: No oropharyngeal exudate.  Eyes: Pupils are equal, round, and reactive to light.  Neck: Neck supple.  Cardiovascular: Normal rate and normal heart sounds.   Pulmonary/Chest: Effort normal. No respiratory distress. He has no wheezes.  Abdominal: Soft. He exhibits no distension. There is no tenderness.  Genitourinary:  External anal exam has external skin tags circumferentially without significant external hemorrhoid disease, small posterior midline anal fissure which is mostly healed, digital rectal exam reveals posterior internal hemorrhoid, endoscopy was then done. Large posterior internal hemorrhoid was visualized and banded with a rubber band applicator. He tolerated this very well. There was no bleeding.  Neurological: He is alert and oriented to person, place, and time.       Assessment:     Internal hemorrhoids, anal pain, small anal fissure    Plan:     Internal hemorrhoid was banded again as above, I would like him to see Dr. Maisie Fus the colorectal specialist here at our practice. I feel his ongoing pain warrants further evaluation. I do not think is entirely due to the fissure that he has. We will arrange for him to see Dr. Maisie Fus. Plan was discussed in detail with the patient and his sister. Of note he had a colonoscopy last year.

## 2012-11-25 ENCOUNTER — Telehealth (INDEPENDENT_AMBULATORY_CARE_PROVIDER_SITE_OTHER): Payer: Self-pay | Admitting: *Deleted

## 2012-11-25 NOTE — Telephone Encounter (Signed)
Internal hemorrhoids usually do not hurt.  He may be having trouble with external hemorrhoids.  We'll know better after the exam.  Treatment for anal pain would be fiber supplement, stool softeners, plenty of water and sitz baths.

## 2012-11-25 NOTE — Telephone Encounter (Signed)
Patient called to asking for any suggestions he can do for his constant pain from his internal hemorrhoids between now and his appt with Dr. Maisie Fus August 7th.  Patient has seen Dr. Janee Morn previously and had the hems band but states he continues to have pain that he is unable to relieve.

## 2012-11-25 NOTE — Telephone Encounter (Signed)
Patient notified of advice.

## 2012-12-16 ENCOUNTER — Encounter (INDEPENDENT_AMBULATORY_CARE_PROVIDER_SITE_OTHER): Payer: Self-pay | Admitting: General Surgery

## 2012-12-16 ENCOUNTER — Ambulatory Visit (INDEPENDENT_AMBULATORY_CARE_PROVIDER_SITE_OTHER): Payer: 59 | Admitting: General Surgery

## 2012-12-16 VITALS — BP 136/90 | HR 72 | Temp 98.8°F | Resp 15 | Ht 77.0 in | Wt 259.8 lb

## 2012-12-16 DIAGNOSIS — K6289 Other specified diseases of anus and rectum: Secondary | ICD-10-CM

## 2012-12-16 MED ORDER — HYDROCORTISONE 2.5 % RE CREA: TOPICAL_CREAM | Freq: Two times a day (BID) | RECTAL | Status: DC

## 2012-12-16 NOTE — Progress Notes (Signed)
Chief Complaint  Patient presents with  . New Evaluation    eval BT anal spasms and pain    HISTORY: [name] is a 62 y.o. male who presents to the office with rectal pain.  Other symptoms include none.  This had been occurring for several months.  He has tried rubber banding, OTC treatments, suppositories and Sitz baths in the past with minimal success.  BM's makes the symptoms worse.   It is intermittent in nature.  His bowel habits are regular but he feels incomplete emptying and his bowel movements are usually soft.  Sometimes he has to strain.  His fiber intake is dietary.  He does not drink much water.  His last colonoscopy was last year, where several polyps were found.  He denies prolapsing tissue.     Past Medical History  Diagnosis Date  . Coronary artery disease   . Depression   . Peripheral vascular disease   . Seizures   . Deep vein thrombophlebitis of left leg   . Deep vein thrombosis of right lower extremity   . Psoriasis   . Arthritis   . DVT (deep venous thrombosis)     multiple times  . Weakness of both legs   . Peripheral neuropathy   . Venous stasis       Past Surgical History  Procedure Laterality Date  . Colonoscopy  03/26/2012    Procedure: COLONOSCOPY;  Surgeon: Theda Belfast, MD;  Location: [facility];  Service: Endoscopy;  Laterality: N/A;  . Tonsillectomy      as child        Current Outpatient Prescriptions  Medication Sig Dispense Refill  . acetaminophen (TYLENOL) 500 MG tablet Take 500 mg by mouth 2 (two) times daily as needed.      . calcium carbonate (OS-CAL) 600 MG TABS Take 600 mg by mouth daily.      Marland Kitchen docusate sodium (COLACE) 100 MG capsule Take 100 mg by mouth 2 (two) times daily as needed.      . levETIRAcetam (KEPPRA) 750 MG tablet Take 2 tablets (1,500 mg total) by mouth 3 (three) times daily.  540 tablet  1  . Multiple Vitamin (MULTIVITAMIN WITH MINERALS) TABS Take 1 tablet by mouth daily.      . phenytoin (DILANTIN) 100 MG ER  capsule One po TID on Monday, Wednesday and Friday and one QID on Tuesday, Thursday, Saturday and Sunday BRAND MEDICALLY NECESSARY  335 capsule  1  . PROCTOSOL HC 2.5 % rectal cream       . triamcinolone ointment (KENALOG) 0.1 % Apply topically 2 (two) times daily.      . vitamin B-12 (CYANOCOBALAMIN) 1000 MCG tablet Take 1,000 mcg by mouth daily.      Marland Kitchen warfarin (COUMADIN) 5 MG tablet Take 2.5-5 mg by mouth daily. Takes 2.5mg  on Monday, Wednesday and Saturday, 5mg  on Tuesday, Thursday,Friday and Sunday       No current facility-administered medications for this visit.      Allergies  Allergen Reactions  . Penicillins     Mother and brother have had severe allergic reactions, no personal reaction      Family History  Problem Relation Age of Onset  . Vision loss Father   . Hypertension Father   . Diabetes Father   . Heart disease Father   . Cancer Father     colon  . Diabetes Mother   . Hypertension Mother   . Heart disease Mother   .  Dementia Mother   . Cancer Mother     breast ca  . Diabetes Sister   . Hyperlipidemia Sister   . Hypertension Sister   . Diabetes Brother   . Kidney disease Brother   . Heart disease Brother     History   Social History  . Marital Status: Single    Spouse Name: N/A    Number of Children: N/A  . Years of Education: N/A   Social History Main Topics  . Smoking status: Former Smoker    Types: Pipe    Quit date: 03/26/1982  . Smokeless tobacco: None  . Alcohol Use: No  . Drug Use: None  . Sexually Active: None   Other Topics Concern  . None   Social History Narrative  . None      REVIEW OF SYSTEMS - PERTINENT POSITIVES ONLY: Review of Systems - General ROS: negative for - chills, fever or weight loss Hematological and Lymphatic ROS: negative for - bleeding problems, blood clots or bruising Respiratory ROS: no cough, shortness of breath, or wheezing Cardiovascular ROS: no chest pain or dyspnea on exertion Gastrointestinal ROS:  no abdominal pain, change in bowel habits, or black or bloody stools Genito-Urinary ROS: no dysuria, trouble voiding, or hematuria  EXAM: Filed Vitals:   12/16/12 1451  BP: 136/90  Pulse: 72  Temp: 98.8 F (37.1 C)  Resp: 15    General appearance: alert and cooperative Resp: clear to auscultation bilaterally Cardio: regular rate and rhythm GI: soft, non-tender; bowel sounds normal; no masses,  no organomegaly   Procedure: Anoscopy Surgeon: Maisie Fus Diagnosis: anal pain  Assistant: Christella Scheuermann After the risks and benefits were explained, verbal consent was obtained for above procedure  Anesthesia: none Findings: grade 2 internal hemorrhoids, non-inflamed, non-bleeding.  Large external skin tags, no fissure    ASSESSMENT AND PLAN:  [name] is a 62 y.o. M with anal pain.  He is s/p banding on 2 different occasions.  On exam today he has significant skin tags but otherwise his exam was relatively benign.  I will start him on a fiber supplement and plenty of fluids.   I have asked him to avoid straining as much as possible.  I will give him some Anusol cream for what sounds like intermittently inflamed external hemorrhoids.  He denies any bleeding even on Coumadin, therefore I do not think any treatment of his internal hemorrhoids with help his symptoms.  We discussed proper toilet hygiene as well.  I will see him back in 2 months.    Vanita Panda, MD Colon and Rectal Surgery / General Surgery [facility]      Visit Diagnoses: No diagnosis found.  Primary Care Physician: Georgianne Fick, MD

## 2012-12-16 NOTE — Patient Instructions (Signed)
HEMORRHOIDS    Did you know... Hemorrhoids are one of the most common ailments known.  More than half the population will develop hemorrhoids, usually after age 60.  Millions of Americans currently suffer from hemorrhoids.  The average person suffers in silence for a long period before seeking medical care.  Today's treatment methods make some types of hemorrhoid removal much less painful.  What are hemorrhoids? Often described as "varicose veins of the anus and rectum", hemorrhoids are enlarged, bulging blood vessels in and about the anus and lower rectum. There are two types of hemorrhoids: external and internal, which refer to their location.  External (outside) hemorrhoids develop near the anus and are covered by very sensitive skin. These are usually painless. However, if a blood clot (thrombosis) develops in an external hemorrhoid, it becomes a painful, hard lump. The external hemorrhoid may bleed if it ruptures. Internal (inside) hemorrhoids develop within the anus beneath the lining. Painless bleeding and protrusion during bowel movements are the most common symptom. However, an internal hemorrhoid can cause severe pain if it is completely "prolapsed" - protrudes from the anal opening and cannot be pushed back inside.   What causes hemorrhoids? An exact cause is unknown; however, the upright posture of humans alone forces a great deal of pressure on the rectal veins, which sometimes causes them to bulge. Other contributing factors include:  . Aging  . Chronic constipation or diarrhea  . Pregnancy  . Heredity  . Straining during bowel movements  . Faulty bowel function due to overuse of laxatives or enemas . Spending long periods of time (e.g., reading) on the toilet  Whatever the cause, the tissues supporting the vessels stretch. As a result, the vessels dilate; their walls become thin and bleed. If the stretching and pressure continue, the weakened vessels protrude.  What are the  symptoms? If you notice any of the following, you could have hemorrhoids:  . Bleeding during bowel movements  . Protrusion during bowel movements . Itching in the anal area  . Pain  . Sensitive lump(s)  How are hemorrhoids treated? Mild symptoms can be relieved frequently by increasing the amount of fiber (e.g., fruits, vegetables, breads and cereals) and fluids in the diet. Eliminating excessive straining reduces the pressure on hemorrhoids and helps prevent them from protruding. A sitz bath - sitting in plain warm water for about 10 minutes - can also provide some relief . With these measures, the pain and swelling of most symptomatic hemorrhoids will decrease in two to seven days, and the firm lump should recede within four to six weeks. In cases of severe or persistent pain from a thrombosed hemorrhoid, your physician may elect to remove the hemorrhoid containing the clot with a small incision. Performed under local anesthesia as an outpatient, this procedure generally provides relief. Severe hemorrhoids may require special treatment, much of which can be performed on an outpatient basis.  . Ligation - the rubber band treatment - works effectively on internal hemorrhoids that protrude with bowel movements. A small rubber band is placed over the hemorrhoid, cutting off its blood supply. The hemorrhoid and the band fall off in a few days and the wound usually heals in a week or two. This procedure sometimes produces mild discomfort and bleeding and may need to be repeated for a full effect.  There is a more intense version of this procedure that is done in the OR as outpatient surgery called THD.  It involves identifying blood vessels leading to the   hemorrhoids and then tying them off with sutures.  This method is a little more painful than rubber band ligation but less painful than traditional hemorrhoidectomy and usually does not have to be repeated.  It is best for internal hemorrhoids that  bleed.  Rubber Band Ligation of Internal Hemorrhoids:  A.  Bulging, bleeding, internal hemorrhoid B.  Rubber band applied at the base of the hemorrhoid C.  About 7 days later, the banded hemorrhoid has fallen off leaving a small scar (arrow)  . Injection and Coagulation can also be used on bleeding hemorrhoids that do not protrude. Both methods are relatively painless and cause the hemorrhoid to shrivel up. . Hemorrhoidectomy - surgery to remove the hemorrhoids - is the most complete method for removal of internal and external hemorrhoids. It is necessary when (1) clots repeatedly form in external hemorrhoids; (2) ligation fails to treat internal hemorrhoids; (3) the protruding hemorrhoid cannot be reduced; or (4) there is persistent bleeding. A hemorrhoidectomy removes excessive tissue that causes the bleeding and protrusion. It is done under anesthesia using sutures, and may, depending upon circumstances, require hospitalization and a period of inactivity. Laser hemorrhoidectomies do not offer any advantage over standard operative techniques. They are also quite expensive, and contrary to popular belief, are no less painful.  Do hemorrhoids lead to cancer? No. There is no relationship between hemorrhoids and cancer. However, the symptoms of hemorrhoids, particularly bleeding, are similar to those of colorectal cancer and other diseases of the digestive system. Therefore, it is important that all symptoms are investigated by a physician specially trained in treating diseases of the colon and rectum and that everyone 50 years or older undergo screening tests for colorectal cancer. Do not rely on over-the-counter medications or other self-treatments. See a colorectal surgeon first so your symptoms can be properly evaluated and effective treatment prescribed.  2012 American Society of Colon & Rectal Surgeons    Fiber Chart  You should 25-30g of fiber per day and drinking 8 glasses of water to help  your bowels move regularly.  In the chart below you can look up how much fiber you are getting in an average day.  If you are not getting enough fiber, you should add a fiber supplement to your diet.  Examples of this include Metamucil, FiberCon and Citrucel.  These can be purchased at your local grocery store or pharmacy.      http://www.[employer].edu/offices/health/nutritioncoach/AtoZ/handouts/Fiber.pdf    GETTING TO GOOD BOWEL HEALTH. Irregular bowel habits such as constipation can lead to many problems over time.  Having one soft bowel movement a day is the most important way to prevent further problems.  The anorectal canal is designed to handle stretching and feces to safely manage our ability to get rid of solid waste (feces, poop, stool) out of our body.  BUT, hard constipated stools can act like ripping concrete bricks causing inflamed hemorrhoids, anal fissures, abdominal pain and bloating.     The goal: ONE SOFT BOWEL MOVEMENT A DAY!  To have soft, regular bowel movements:    Drink at least 8 tall glasses of water a day.     Take plenty of fiber.  Fiber is the undigested part of plant food that passes into the colon, acting s "natures broom" to encourage bowel motility and movement.  Fiber can absorb and hold large amounts of water. This results in a larger, bulkier stool, which is soft and easier to pass. Work gradually over several weeks up to 6 servings a   day of fiber (25g a day even more if needed) in the form of: o Vegetables -- Root (potatoes, carrots, turnips), leafy green (lettuce, salad greens, celery, spinach), or cooked high residue (cabbage, broccoli, etc) o Fruit -- Fresh (unpeeled skin & pulp), Dried (prunes, apricots, cherries, etc ),  or stewed ( applesauce)  o Whole grain breads, pasta, etc (whole wheat)  o Bran cereals    Bulking Agents -- This type of water-retaining fiber generally is easily obtained each day by one of the following:  o Psyllium bran -- The psyllium  plant is remarkable because its ground seeds can retain so much water. This product is available as Metamucil, Konsyl, Effersyllium, Per Diem Fiber, or the less expensive generic preparation in drug and health food stores. Although labeled a laxative, it really is not a laxative.  o Methylcellulose -- This is another fiber derived from wood which also retains water. It is available as Citrucel. o Polyethylene Glycol - and "artificial" fiber commonly called Miralax or Glycolax.  It is helpful for people with gassy or bloated feelings with regular fiber o Flax Seed - a less gassy fiber than psyllium   No reading or other relaxing activity while on the toilet. If bowel movements take longer than 5 minutes, you are too constipated.   AVOID CONSTIPATION.  High fiber and water intake usually takes care of this.  Sometimes a laxative is needed to stimulate more frequent bowel movements, but    Laxatives are not a good long-term solution as it can wear the colon out. o Osmotics (Milk of Magnesia, Fleets phosphosoda, Magnesium citrate, MiraLax, GoLytely) are safer than  o Stimulants (Senokot, Castor Oil, Dulcolax, Ex Lax)    o Do not take laxatives for more than 7days in a row.    IF SEVERELY CONSTIPATED, try a Bowel Retraining Program: o Do not use laxatives.  o Eat a diet high in roughage, such as bran cereals and leafy vegetables.  o Drink six (6) ounces of prune or apricot juice each morning.  o Eat two (2) large servings of stewed fruit each day.  o Take one (1) heaping tablespoon of a psyllium-based bulking agent twice a day. Use sugar-free sweetener when possible to avoid excessive calories.  o Eat a normal breakfast.  o Set aside 15 minutes after breakfast to sit on the toilet, but do not strain to have a bowel movement.  o If you do not have a bowel movement by the third day, use an enema and repeat the above steps.    

## 2013-01-11 ENCOUNTER — Telehealth: Payer: Self-pay | Admitting: Neurology

## 2013-01-13 NOTE — Telephone Encounter (Signed)
Reassigned to Dr. Dohmeier.  Sent to scheduling.  

## 2013-01-25 ENCOUNTER — Other Ambulatory Visit: Payer: Self-pay | Admitting: Internal Medicine

## 2013-01-25 DIAGNOSIS — N390 Urinary tract infection, site not specified: Secondary | ICD-10-CM

## 2013-01-31 ENCOUNTER — Ambulatory Visit
Admission: RE | Admit: 2013-01-31 | Discharge: 2013-01-31 | Disposition: A | Discharge status: Home / Self Care | Payer: Medicare Other | Source: Ambulatory Visit | Attending: Internal Medicine | Admitting: Internal Medicine

## 2013-01-31 DIAGNOSIS — N390 Urinary tract infection, site not specified: Secondary | ICD-10-CM

## 2013-02-15 ENCOUNTER — Telehealth (INDEPENDENT_AMBULATORY_CARE_PROVIDER_SITE_OTHER): Payer: Self-pay

## 2013-02-15 NOTE — Telephone Encounter (Signed)
Pt called asking if he should d/c coumadin for his ov tomorrow. Pt advised this is a reck appt and pt should not stop blood thinner unless advised by his MD. Pt states he will continue med and keep appt tomorrow.

## 2013-02-16 ENCOUNTER — Encounter (INDEPENDENT_AMBULATORY_CARE_PROVIDER_SITE_OTHER): Payer: Self-pay | Admitting: General Surgery

## 2013-02-16 ENCOUNTER — Ambulatory Visit: Payer: Self-pay | Admitting: Neurology

## 2013-02-16 ENCOUNTER — Ambulatory Visit (INDEPENDENT_AMBULATORY_CARE_PROVIDER_SITE_OTHER): Payer: 59 | Admitting: General Surgery

## 2013-02-16 VITALS — BP 160/90 | HR 78 | Temp 97.1°F | Ht 77.0 in | Wt 265.0 lb

## 2013-02-16 DIAGNOSIS — K648 Other hemorrhoids: Secondary | ICD-10-CM

## 2013-02-16 MED ORDER — HYDROCORTISONE ACETATE 25 MG RE SUPP: 25.0000 mg | Freq: Every evening | RECTAL | Status: DC | PRN

## 2013-02-16 NOTE — Progress Notes (Signed)
BODIE ABERNETHY is a 62 y.o. male who is here for a follow up visit regarding his anal pain.  He is still having pain after BM's.  He also has pain when he urinates.    Objective: Filed Vitals:   02/16/13 1508  BP: 160/90  Pulse: 78  Temp: 97.1 F (36.2 C)    General appearance: alert and cooperative GI: normal findings: soft, non-tender  Procedure: Anoscopy Surgeon: Maisie Fus Assistant: Christella Scheuermann After the risks and benefits were explained, verbal consent was obtained for above procedure  Anesthesia: none Diagnosis: anal pain Findings: inflamed internal hemorrhoids, no fissure, non-inflamed external skin tags  Assessment and Plan: Anusol suppositories.  Double fiber intake.  Start Colace BID.      Marland KitchenVanita Panda, MD [facility], [place] [phone]

## 2013-02-16 NOTE — Patient Instructions (Signed)
Make sure you are getting 2 liters of a water per day and 25g fiber per day.  You should double your Citrucel dose and start a stool softener like colace twice a day.  You can do sitz baths for pain over your toilet.  Use the suppository at night to help with the pain as well.  You should not strain to have bowel movements.

## 2013-02-22 ENCOUNTER — Other Ambulatory Visit: Payer: Self-pay | Admitting: Neurology

## 2013-02-23 NOTE — Telephone Encounter (Signed)
[facility] Patient Assigned to Dr [name]

## 2013-03-17 ENCOUNTER — Other Ambulatory Visit: Payer: Self-pay

## 2013-03-17 ENCOUNTER — Other Ambulatory Visit (INDEPENDENT_AMBULATORY_CARE_PROVIDER_SITE_OTHER): Payer: Self-pay

## 2013-03-17 DIAGNOSIS — K648 Other hemorrhoids: Secondary | ICD-10-CM

## 2013-03-17 MED ORDER — HYDROCORTISONE ACETATE 25 MG RE SUPP: 25.0000 mg | Freq: Every evening | RECTAL | Status: DC | PRN

## 2013-04-06 ENCOUNTER — Emergency Department (HOSPITAL_COMMUNITY)
Admission: EM | Admit: 2013-04-06 | Discharge: 2013-04-06 | Disposition: A | Discharge status: Home / Self Care | Payer: 59 | Attending: Emergency Medicine | Admitting: Emergency Medicine

## 2013-04-06 ENCOUNTER — Encounter (HOSPITAL_COMMUNITY): Payer: Self-pay | Admitting: Emergency Medicine

## 2013-04-06 DIAGNOSIS — Z87891 Personal history of nicotine dependence: Secondary | ICD-10-CM | POA: Insufficient documentation

## 2013-04-06 DIAGNOSIS — N138 Other obstructive and reflux uropathy: Secondary | ICD-10-CM | POA: Insufficient documentation

## 2013-04-06 DIAGNOSIS — N419 Inflammatory disease of prostate, unspecified: Secondary | ICD-10-CM | POA: Insufficient documentation

## 2013-04-06 DIAGNOSIS — R3915 Urgency of urination: Secondary | ICD-10-CM | POA: Insufficient documentation

## 2013-04-06 DIAGNOSIS — F329 Major depressive disorder, single episode, unspecified: Secondary | ICD-10-CM | POA: Insufficient documentation

## 2013-04-06 DIAGNOSIS — Z880 Allergy status to penicillin: Secondary | ICD-10-CM | POA: Insufficient documentation

## 2013-04-06 DIAGNOSIS — M129 Arthropathy, unspecified: Secondary | ICD-10-CM | POA: Insufficient documentation

## 2013-04-06 DIAGNOSIS — G609 Hereditary and idiopathic neuropathy, unspecified: Secondary | ICD-10-CM | POA: Insufficient documentation

## 2013-04-06 DIAGNOSIS — R339 Retention of urine, unspecified: Secondary | ICD-10-CM | POA: Insufficient documentation

## 2013-04-06 DIAGNOSIS — K644 Residual hemorrhoidal skin tags: Secondary | ICD-10-CM | POA: Insufficient documentation

## 2013-04-06 DIAGNOSIS — N401 Enlarged prostate with lower urinary tract symptoms: Secondary | ICD-10-CM | POA: Insufficient documentation

## 2013-04-06 DIAGNOSIS — I739 Peripheral vascular disease, unspecified: Secondary | ICD-10-CM | POA: Insufficient documentation

## 2013-04-06 DIAGNOSIS — Z79899 Other long term (current) drug therapy: Secondary | ICD-10-CM | POA: Insufficient documentation

## 2013-04-06 DIAGNOSIS — Z86718 Personal history of other venous thrombosis and embolism: Secondary | ICD-10-CM | POA: Insufficient documentation

## 2013-04-06 DIAGNOSIS — R300 Dysuria: Secondary | ICD-10-CM | POA: Insufficient documentation

## 2013-04-06 DIAGNOSIS — G40909 Epilepsy, unspecified, not intractable, without status epilepticus: Secondary | ICD-10-CM | POA: Insufficient documentation

## 2013-04-06 DIAGNOSIS — F3289 Other specified depressive episodes: Secondary | ICD-10-CM | POA: Insufficient documentation

## 2013-04-06 DIAGNOSIS — Z7901 Long term (current) use of anticoagulants: Secondary | ICD-10-CM | POA: Insufficient documentation

## 2013-04-06 DIAGNOSIS — I2510 Atherosclerotic heart disease of native coronary artery without angina pectoris: Secondary | ICD-10-CM | POA: Insufficient documentation

## 2013-04-06 HISTORY — DX: Benign prostatic hyperplasia without lower urinary tract symptoms: N40.0

## 2013-04-06 LAB — URINALYSIS, ROUTINE W REFLEX MICROSCOPIC
Glucose, UA: NEGATIVE mg/dL
Hgb urine dipstick: NEGATIVE
Ketones, ur: NEGATIVE mg/dL
Protein, ur: NEGATIVE mg/dL
Urobilinogen, UA: 0.2 mg/dL (ref 0.0–1.0)

## 2013-04-06 LAB — URINE MICROSCOPIC-ADD ON

## 2013-04-06 MED ORDER — LIDOCAINE HCL 2 % EX GEL
CUTANEOUS | Status: AC
  Administered 2013-04-06: 05:00:00
  Filled 2013-04-06: qty 10

## 2013-04-06 MED ORDER — OXYCODONE-ACETAMINOPHEN 5-325 MG PO TABS
2.0000 | ORAL_TABLET | Freq: Once | ORAL | Status: AC
  Administered 2013-04-06: 2 via ORAL
  Filled 2013-04-06: qty 2

## 2013-04-06 MED ORDER — HYDROCODONE-ACETAMINOPHEN 5-325 MG PO TABS: 1.0000 | ORAL_TABLET | ORAL | Status: DC | PRN

## 2013-04-06 MED ORDER — CIPROFLOXACIN HCL 500 MG PO TABS: 500.0000 mg | ORAL_TABLET | Freq: Two times a day (BID) | ORAL | Status: DC

## 2013-04-06 NOTE — ED Provider Notes (Signed)
CSN: [id number]     Arrival date & time 04/06/13  0308 History   First MD Initiated Contact with Patient 04/06/13 [phone]     Chief Complaint  Patient presents with  . Rectal Pain   (Consider location/radiation/quality/duration/timing/severity/associated sxs/prior Treatment) Patient is a 62 y.o. male presenting with male genitourinary complaint.  Male GU Problem Presenting symptoms: dysuria   Presenting symptoms comment:  Rectal pain Context comment:  Cystoscopy 1 week ago, hx of prostate hypertrophy Relieved by:  Nothing Worsened by:  Urination Ineffective treatments: tylenol. Associated symptoms: urinary retention   Associated symptoms: no abdominal pain, no diarrhea, no fever, no groin pain and no vomiting   Associated symptoms comment:  Urge to urinate, urge to deficate   Past Medical History  Diagnosis Date  . Coronary artery disease   . Depression   . Peripheral vascular disease   . Seizures   . Deep vein thrombophlebitis of left leg   . Deep vein thrombosis of right lower extremity   . Psoriasis   . Arthritis   . DVT (deep venous thrombosis)     multiple times  . Weakness of both legs   . Peripheral neuropathy   . Venous stasis   . Hyperplasia of prostate    Past Surgical History  Procedure Laterality Date  . Colonoscopy  03/26/2012    Procedure: COLONOSCOPY;  Surgeon: Theda Belfast, MD;  Location: [facility];  Service: Endoscopy;  Laterality: N/A;  . Tonsillectomy      as child   Family History  Problem Relation Age of Onset  . Vision loss Father   . Hypertension Father   . Diabetes Father   . Heart disease Father   . Cancer Father     colon  . Diabetes Mother   . Hypertension Mother   . Heart disease Mother   . Dementia Mother   . Cancer Mother     breast ca  . Diabetes Sister   . Hyperlipidemia Sister   . Hypertension Sister   . Diabetes Brother   . Kidney disease Brother   . Heart disease Brother    History  Substance Use Topics  .  Smoking status: Former Smoker    Types: Pipe    Quit date: 03/26/1982  . Smokeless tobacco: Not on file  . Alcohol Use: No    Review of Systems  Constitutional: Negative for fever.  HENT: Negative for congestion.   Respiratory: Negative for cough and shortness of breath.   Cardiovascular: Negative for chest pain.  Gastrointestinal: Negative for vomiting, abdominal pain and diarrhea.  Genitourinary: Positive for dysuria.  All other systems reviewed and are negative.    Allergies  Penicillins  Home Medications   Current Outpatient Rx  Name  Route  Sig  Dispense  Refill  . acetaminophen (TYLENOL) 500 MG tablet   Oral   Take 500 mg by mouth 2 (two) times daily as needed.         . Calcium Carb-Cholecalciferol (CALCIUM-VITAMIN D) 600-400 MG-UNIT TABS   Oral   Take 1 tablet by mouth daily.          Marland Kitchen DILANTIN 100 MG ER capsule      TAKE 1 CAPSULE THREE TIMES A DAY MONDAY, WEDNESDAY, AND FRIDAY AND 1 CAPSULE FOUR TIMES A DAY ON TUESDAY, THURSDAY, SATURDAY AND SUNDAY   335 capsule   1     Dispense as written.   . docusate sodium (COLACE) 100 MG capsule   Oral  Take 100 mg by mouth 2 (two) times daily as needed.         . fesoterodine (TOVIAZ) 4 MG TB24 tablet   Oral   Take 4 mg by mouth daily.         . hydrocortisone (ANUSOL-HC) 25 MG suppository   Rectal   Place 1 suppository (25 mg total) rectally at bedtime as needed for hemorrhoids.   12 suppository   0   . hydrocortisone (PROCTOSOL HC) 2.5 % rectal cream   Rectal   Place rectally 2 (two) times daily.   30 g   2   . levETIRAcetam (KEPPRA) 750 MG tablet      TAKE 2 TABLETS THREE TIMES A DAY   540 tablet   1   . Multiple Vitamin (MULTIVITAMIN WITH MINERALS) TABS   Oral   Take 1 tablet by mouth daily.         . psyllium (HYDROCIL/METAMUCIL) 95 % PACK   Oral   Take 1 packet by mouth 2 (two) times daily.         . tamsulosin (FLOMAX) 0.4 MG CAPS capsule   Oral   Take 0.4 mg by mouth  daily.         Marland Kitchen triamcinolone ointment (KENALOG) 0.1 %   Topical   Apply topically 2 (two) times daily.         . vitamin B-12 (CYANOCOBALAMIN) 1000 MCG tablet   Oral   Take 1,000 mcg by mouth daily.         Marland Kitchen warfarin (COUMADIN) 5 MG tablet   Oral   Take 2.5-5 mg by mouth daily. Take 1 tablet (5mg ) daily EXCEPT for Tuesdays and Saturdays take 1/2 tablet (2.5mg )          BP 159/99  Pulse 88  Temp(Src) 98.2 F (36.8 C)  Resp 20  SpO2 96% Physical Exam  Nursing note and vitals reviewed. Constitutional: He is oriented to person, place, and time. He appears well-developed and well-nourished. No distress.  HENT:  Head: Normocephalic and atraumatic.  Mouth/Throat: Oropharynx is clear and moist.  Eyes: Conjunctivae are normal. Pupils are equal, round, and reactive to light. No scleral icterus.  Neck: Neck supple.  Cardiovascular: Normal rate, regular rhythm, normal heart sounds and intact distal pulses.   No murmur heard. Pulmonary/Chest: Effort normal and breath sounds normal. No stridor. No respiratory distress. He has no wheezes. He has no rales.  Abdominal: Soft. He exhibits no distension. There is no tenderness. There is no rigidity, no rebound, no guarding and no CVA tenderness.  Genitourinary: Penis normal. Rectal exam shows external hemorrhoid (soft). Prostate is enlarged and tender. Right testis shows no tenderness. Left testis shows no tenderness (mild tenderness of left epididymis).  Musculoskeletal: Normal range of motion. He exhibits no edema.  Neurological: He is alert and oriented to person, place, and time.  Skin: Skin is warm and dry. No rash noted.  Psychiatric: He has a normal mood and affect. His behavior is normal.    ED Course  Procedures (including critical care time) Labs Review Labs Reviewed  URINALYSIS, ROUTINE W REFLEX MICROSCOPIC - Abnormal; Notable for the following:    Leukocytes, UA SMALL (*)    All other components within normal limits   URINE MICROSCOPIC-ADD ON  OCCULT BLOOD, POC DEVICE   Imaging Review No results found.  EKG Interpretation   None       MDM   1. Prostatitis    62 yo male with  hx of prostatic hypertrophy presenting with squeezing pain felt in his rectum, as well as dysuria.  Pain described as a constant, squeezing pain.  Prostate was enlarged and tender.  Scrotum was without cellulitis or crepitus, but with mild tenderness of left epididymus.  He was, in general, non toxic and well appearing.  Urinalysis reveled pyuria.  Pain treated with percocet.  Plan treatment for prostatitis, but he will need to follow up with his urologist as well.  Advised to f/u closely with PCP for recheck and INR check.     Candyce Churn, MD 04/06/13 [phone]

## 2013-04-06 NOTE — ED Notes (Signed)
Pt c/o pain with urination "for a while"; seeing Nesi for same; pain radiating from prostate to rectum; feels like spasms of pain in rectum x 2 days

## 2013-04-06 NOTE — ED Notes (Signed)
Assisted pt to beside commode.  Pt was not able to urinate.  Informed RN.

## 2013-04-06 NOTE — ED Notes (Addendum)
Unable to complete in and out d/t difficulty with insertion.  Used caude cath per MD ok and order. Cath placement by J. Oxendine, RN

## 2013-04-18 ENCOUNTER — Ambulatory Visit (INDEPENDENT_AMBULATORY_CARE_PROVIDER_SITE_OTHER): Payer: 59 | Admitting: General Surgery

## 2013-04-18 ENCOUNTER — Encounter (INDEPENDENT_AMBULATORY_CARE_PROVIDER_SITE_OTHER): Payer: Self-pay | Admitting: General Surgery

## 2013-04-18 VITALS — BP 148/96 | HR 80 | Temp 98.9°F | Resp 15 | Ht 77.0 in | Wt 267.8 lb

## 2013-04-18 DIAGNOSIS — K648 Other hemorrhoids: Secondary | ICD-10-CM

## 2013-04-18 MED ORDER — HYDROCORTISONE ACETATE 25 MG RE SUPP: 25.0000 mg | Freq: Every evening | RECTAL | Status: DC | PRN

## 2013-04-18 NOTE — Progress Notes (Signed)
[name] is a 62 y.o. male who is here for a follow up visit regarding his anal pain.  He is also having pain when he urinates.  He has been diagnosed with prostatitis and is on a course of cipro.  He has been doing the Anusol suppositories daily.  He is on a fiber supplement and stool softeners.    Objective: Filed Vitals:   04/18/13 1502  BP: 148/96  Pulse: 80  Temp: 98.9 F (37.2 C)  Resp: 15    General appearance: alert and cooperative GI: normal findings: soft  Procedure: Anoscopy Surgeon: Maisie Fus Assistant: Christella Scheuermann After the risks and benefits were explained, verbal consent was obtained for above procedure  Anesthesia: none Diagnosis: anal pain Findings: inflammation somewhat better   Assessment and Plan: Cont anusol, fiber, stool softeners.  RTO in 2 months.  If pain no better, would consider banding.      Vanita Panda, MD [facility], [place] [phone]

## 2013-04-18 NOTE — Addendum Note (Signed)
Addended by: Vanita Panda on: 04/18/2013 03:21 PM   Modules accepted: Orders

## 2013-04-18 NOTE — Patient Instructions (Signed)
Continue using suppositories.  Return to the office in 2 months.

## 2013-06-01 ENCOUNTER — Other Ambulatory Visit (INDEPENDENT_AMBULATORY_CARE_PROVIDER_SITE_OTHER): Payer: Self-pay

## 2013-06-01 DIAGNOSIS — K648 Other hemorrhoids: Secondary | ICD-10-CM

## 2013-06-01 MED ORDER — HYDROCORTISONE ACETATE 25 MG RE SUPP: 25.0000 mg | Freq: Every evening | RECTAL | Status: DC | PRN

## 2013-06-08 ENCOUNTER — Ambulatory Visit: Payer: Self-pay | Admitting: Neurology

## 2013-06-30 ENCOUNTER — Ambulatory Visit (INDEPENDENT_AMBULATORY_CARE_PROVIDER_SITE_OTHER): Payer: 59 | Admitting: General Surgery

## 2013-07-28 ENCOUNTER — Ambulatory Visit (INDEPENDENT_AMBULATORY_CARE_PROVIDER_SITE_OTHER): Payer: 59 | Admitting: General Surgery

## 2013-08-04 ENCOUNTER — Other Ambulatory Visit: Payer: Self-pay | Admitting: Neurology

## 2013-08-04 NOTE — Telephone Encounter (Signed)
[facility] patient assigned to Dr Brett Fairy.  Has appt scheduled

## 2013-08-30 ENCOUNTER — Other Ambulatory Visit: Payer: Self-pay | Admitting: Neurology

## 2013-08-31 NOTE — Telephone Encounter (Signed)
[facility] patient assigned to Dr Brett Fairy.  Has appt scheduled in June

## 2013-09-01 ENCOUNTER — Emergency Department (HOSPITAL_COMMUNITY): Payer: Medicare Other

## 2013-09-01 ENCOUNTER — Encounter (HOSPITAL_COMMUNITY): Payer: Self-pay | Admitting: Emergency Medicine

## 2013-09-01 ENCOUNTER — Observation Stay (HOSPITAL_COMMUNITY)
Admission: EM | Admit: 2013-09-01 | Discharge: 2013-09-02 | Disposition: A | Discharge status: Skilled Nursing Facility | Payer: Medicare Other | Attending: Internal Medicine | Admitting: Internal Medicine

## 2013-09-01 DIAGNOSIS — Z79899 Other long term (current) drug therapy: Secondary | ICD-10-CM | POA: Insufficient documentation

## 2013-09-01 DIAGNOSIS — S82843A Displaced bimalleolar fracture of unspecified lower leg, initial encounter for closed fracture: Principal | ICD-10-CM | POA: Diagnosis present

## 2013-09-01 DIAGNOSIS — Y92009 Unspecified place in unspecified non-institutional (private) residence as the place of occurrence of the external cause: Secondary | ICD-10-CM | POA: Insufficient documentation

## 2013-09-01 DIAGNOSIS — I2510 Atherosclerotic heart disease of native coronary artery without angina pectoris: Secondary | ICD-10-CM | POA: Insufficient documentation

## 2013-09-01 DIAGNOSIS — E785 Hyperlipidemia, unspecified: Secondary | ICD-10-CM | POA: Insufficient documentation

## 2013-09-01 DIAGNOSIS — Z86718 Personal history of other venous thrombosis and embolism: Secondary | ICD-10-CM

## 2013-09-01 DIAGNOSIS — Z87891 Personal history of nicotine dependence: Secondary | ICD-10-CM | POA: Insufficient documentation

## 2013-09-01 DIAGNOSIS — W19XXXA Unspecified fall, initial encounter: Secondary | ICD-10-CM

## 2013-09-01 DIAGNOSIS — G40909 Epilepsy, unspecified, not intractable, without status epilepticus: Secondary | ICD-10-CM | POA: Insufficient documentation

## 2013-09-01 DIAGNOSIS — R569 Unspecified convulsions: Secondary | ICD-10-CM

## 2013-09-01 DIAGNOSIS — S82899A Other fracture of unspecified lower leg, initial encounter for closed fracture: Secondary | ICD-10-CM | POA: Diagnosis present

## 2013-09-01 DIAGNOSIS — W010XXA Fall on same level from slipping, tripping and stumbling without subsequent striking against object, initial encounter: Secondary | ICD-10-CM | POA: Insufficient documentation

## 2013-09-01 DIAGNOSIS — Z7901 Long term (current) use of anticoagulants: Secondary | ICD-10-CM

## 2013-09-01 DIAGNOSIS — I739 Peripheral vascular disease, unspecified: Secondary | ICD-10-CM | POA: Insufficient documentation

## 2013-09-01 LAB — CBC WITH DIFFERENTIAL/PLATELET
BASOS ABS: 0 10*3/uL (ref 0.0–0.1)
BASOS PCT: 0 % (ref 0–1)
EOS PCT: 3 % (ref 0–5)
Eosinophils Absolute: 0.2 10*3/uL (ref 0.0–0.7)
HEMATOCRIT: 47 % (ref 39.0–52.0)
Hemoglobin: 16.1 g/dL (ref 13.0–17.0)
Lymphocytes Relative: 18 % (ref 12–46)
Lymphs Abs: 1.4 10*3/uL (ref 0.7–4.0)
MCH: 33.3 pg (ref 26.0–34.0)
MCHC: 34.3 g/dL (ref 30.0–36.0)
MCV: 97.3 fL (ref 78.0–100.0)
MONO ABS: 0.8 10*3/uL (ref 0.1–1.0)
Monocytes Relative: 11 % (ref 3–12)
NEUTROS ABS: 5.3 10*3/uL (ref 1.7–7.7)
Neutrophils Relative %: 68 % (ref 43–77)
PLATELETS: 182 10*3/uL (ref 150–400)
RBC: 4.83 MIL/uL (ref 4.22–5.81)
RDW: 12.6 % (ref 11.5–15.5)
WBC: 7.7 10*3/uL (ref 4.0–10.5)

## 2013-09-01 LAB — BASIC METABOLIC PANEL
BUN: 11 mg/dL (ref 6–23)
CALCIUM: 8.8 mg/dL (ref 8.4–10.5)
CO2: 29 mEq/L (ref 19–32)
Chloride: 99 mEq/L (ref 96–112)
Creatinine, Ser: 0.79 mg/dL (ref 0.50–1.35)
GFR calc non Af Amer: >90 mL/min (ref >90)
Glucose, Bld: 89 mg/dL (ref 70–99)
Potassium: 4 mEq/L (ref 3.7–5.3)
SODIUM: 139 meq/L (ref 137–147)

## 2013-09-01 LAB — PROTIME-INR
INR: 2.4 — ABNORMAL HIGH (ref 0.00–1.49)
PROTHROMBIN TIME: 25.4 s — AB (ref 11.6–15.2)

## 2013-09-01 MED ORDER — TAMSULOSIN HCL 0.4 MG PO CAPS
0.4000 mg | ORAL_CAPSULE | Freq: Every day | ORAL | Status: DC
  Administered 2013-09-01 – 2013-09-02 (×2): 0.4 mg via ORAL
  Filled 2013-09-01 (×2): qty 1

## 2013-09-01 MED ORDER — ACETAMINOPHEN 325 MG PO TABS: 650.0000 mg | ORAL_TABLET | Freq: Four times a day (QID) | ORAL | Status: DC | PRN

## 2013-09-01 MED ORDER — VITAMIN B-12 1000 MCG PO TABS
1000.0000 ug | ORAL_TABLET | Freq: Every day | ORAL | Status: DC
  Administered 2013-09-01 – 2013-09-02 (×2): 1000 ug via ORAL
  Filled 2013-09-01 (×2): qty 1

## 2013-09-01 MED ORDER — ONDANSETRON HCL 4 MG PO TABS: 4.0000 mg | ORAL_TABLET | Freq: Four times a day (QID) | ORAL | Status: DC | PRN

## 2013-09-01 MED ORDER — WARFARIN SODIUM 5 MG PO TABS
5.0000 mg | ORAL_TABLET | Freq: Once | ORAL | Status: AC
  Administered 2013-09-01: 5 mg via ORAL
  Filled 2013-09-01: qty 1

## 2013-09-01 MED ORDER — ONDANSETRON HCL 4 MG/2ML IJ SOLN: 4.0000 mg | Freq: Four times a day (QID) | INTRAMUSCULAR | Status: DC | PRN

## 2013-09-01 MED ORDER — PHENYTOIN SODIUM EXTENDED 100 MG PO CAPS: 400.0000 mg | ORAL_CAPSULE | ORAL | Status: DC

## 2013-09-01 MED ORDER — GABAPENTIN 100 MG PO CAPS
100.0000 mg | ORAL_CAPSULE | Freq: Two times a day (BID) | ORAL | Status: DC
  Administered 2013-09-01 – 2013-09-02 (×3): 100 mg via ORAL
  Filled 2013-09-01 (×4): qty 1

## 2013-09-01 MED ORDER — HYOSCYAMINE SULFATE 0.125 MG PO TABS
0.1250 mg | ORAL_TABLET | ORAL | Status: DC | PRN
  Filled 2013-09-01: qty 1

## 2013-09-01 MED ORDER — PHENYTOIN SODIUM EXTENDED 100 MG PO CAPS
300.0000 mg | ORAL_CAPSULE | ORAL | Status: DC
  Administered 2013-09-02: 300 mg via ORAL
  Filled 2013-09-01: qty 3

## 2013-09-01 MED ORDER — FUROSEMIDE 20 MG PO TABS
20.0000 mg | ORAL_TABLET | Freq: Every day | ORAL | Status: DC
  Administered 2013-09-01 – 2013-09-02 (×2): 20 mg via ORAL
  Filled 2013-09-01 (×2): qty 1

## 2013-09-01 MED ORDER — LEVETIRACETAM 750 MG PO TABS
1500.0000 mg | ORAL_TABLET | Freq: Three times a day (TID) | ORAL | Status: DC
  Administered 2013-09-01 – 2013-09-02 (×4): 1500 mg via ORAL
  Filled 2013-09-01 (×7): qty 2

## 2013-09-01 MED ORDER — HYDROMORPHONE HCL PF 1 MG/ML IJ SOLN
1.0000 mg | Freq: Once | INTRAMUSCULAR | Status: AC
  Administered 2013-09-01: 1 mg via INTRAVENOUS
  Filled 2013-09-01: qty 1

## 2013-09-01 MED ORDER — SODIUM CHLORIDE 0.9 % IJ SOLN: 3.0000 mL | Freq: Two times a day (BID) | INTRAMUSCULAR | Status: DC

## 2013-09-01 MED ORDER — PHENYTOIN SODIUM EXTENDED 100 MG PO CAPS
100.0000 mg | ORAL_CAPSULE | ORAL | Status: DC
  Administered 2013-09-01: 100 mg via ORAL
  Filled 2013-09-01 (×4): qty 1

## 2013-09-01 MED ORDER — PHENYTOIN SODIUM EXTENDED 100 MG PO CAPS: 100.0000 mg | ORAL_CAPSULE | ORAL | Status: DC

## 2013-09-01 MED ORDER — PHENYTOIN SODIUM EXTENDED 100 MG PO CAPS
100.0000 mg | ORAL_CAPSULE | ORAL | Status: DC
  Filled 2013-09-01: qty 1

## 2013-09-01 MED ORDER — HYDROMORPHONE HCL PF 1 MG/ML IJ SOLN: 1.0000 mg | Freq: Once | INTRAMUSCULAR | Status: DC

## 2013-09-01 MED ORDER — SIMVASTATIN 10 MG PO TABS
10.0000 mg | ORAL_TABLET | Freq: Every day | ORAL | Status: DC
  Administered 2013-09-01: 10 mg via ORAL
  Filled 2013-09-01 (×2): qty 1

## 2013-09-01 MED ORDER — WARFARIN - PHARMACIST DOSING INPATIENT: Freq: Every day | Status: DC

## 2013-09-01 MED ORDER — HYDROCODONE-ACETAMINOPHEN 5-325 MG PO TABS
2.0000 | ORAL_TABLET | Freq: Once | ORAL | Status: AC
  Administered 2013-09-01: 2 via ORAL
  Filled 2013-09-01: qty 2

## 2013-09-01 MED ORDER — HYDROMORPHONE HCL PF 1 MG/ML IJ SOLN: 1.0000 mg | INTRAMUSCULAR | Status: DC | PRN

## 2013-09-01 MED ORDER — ACETAMINOPHEN 650 MG RE SUPP: 650.0000 mg | Freq: Four times a day (QID) | RECTAL | Status: DC | PRN

## 2013-09-01 MED ORDER — DOCUSATE SODIUM 100 MG PO CAPS
100.0000 mg | ORAL_CAPSULE | Freq: Two times a day (BID) | ORAL | Status: DC
  Administered 2013-09-01 – 2013-09-02 (×3): 100 mg via ORAL

## 2013-09-01 MED ORDER — PHENYTOIN SODIUM EXTENDED 100 MG PO CAPS
300.0000 mg | ORAL_CAPSULE | Freq: Once | ORAL | Status: AC
  Administered 2013-09-01: 300 mg via ORAL
  Filled 2013-09-01: qty 3

## 2013-09-01 MED ORDER — HYDROCODONE-ACETAMINOPHEN 5-325 MG PO TABS
1.0000 | ORAL_TABLET | Freq: Four times a day (QID) | ORAL | Status: DC | PRN
  Administered 2013-09-01 – 2013-09-02 (×5): 1 via ORAL
  Filled 2013-09-01 (×5): qty 1

## 2013-09-01 NOTE — Consult Note (Signed)
Reason for Consult:Left ankle fracture Referring Physician: Dirk Dress ED  [name] is an 63 y.o. male.  HPI: Patient history includes DVT on coumadin and seizures. He reports falling at home during the night. He lives with his sister. He denies syncope or seizure, but fell by tripping over his walker trying to go the bathroom.Imediate onset of pain in the left ankle region. EMS transported him the [place].[name] denies any other trauma. Imaging was obtained and confirmed a fracture. Ortho tech applied a posterior and stirrup splint. He has seen Dr. Rolena Infante in the past. No SOB or CP. He does report bilateral LE edema.   Past Medical History  Diagnosis Date  . Coronary artery disease   . Depression   . Peripheral vascular disease   . Seizures   . Deep vein thrombophlebitis of left leg   . Deep vein thrombosis of right lower extremity   . Psoriasis   . Arthritis   . DVT (deep venous thrombosis)     multiple times  . Weakness of both legs   . Peripheral neuropathy   . Venous stasis   . Hyperplasia of prostate   . BPH (benign prostatic hyperplasia)     Past Surgical History  Procedure Laterality Date  . Colonoscopy  03/26/2012    Procedure: COLONOSCOPY;  Surgeon: Beryle Beams, MD;  Location: [facility];  Service: Endoscopy;  Laterality: N/A;  . Tonsillectomy      as child    Family History  Problem Relation Age of Onset  . Vision loss Father   . Hypertension Father   . Diabetes Father   . Heart disease Father   . Cancer Father     colon  . Diabetes Mother   . Hypertension Mother   . Heart disease Mother   . Dementia Mother   . Cancer Mother     breast ca  . Diabetes Sister   . Hyperlipidemia Sister   . Hypertension Sister   . Diabetes Brother   . Kidney disease Brother   . Heart disease Brother     Social History:  reports that he quit smoking about 31 years ago. His smoking use included Pipe. He does not have any smokeless tobacco history on file. He reports that  he does not drink alcohol or use illicit drugs.  Allergies:  Allergies  Allergen Reactions  . Penicillins     Mother and brother have had severe allergic reactions, no personal reaction    Medications: I have reviewed the patient's current medications.  Results for orders placed during the hospital encounter of 09/01/13 (from the past 48 hour(s))  CBC WITH DIFFERENTIAL     Status: None   Collection Time    09/01/13  1:24 AM      Result Value Ref Range   WBC 7.7  4.0 - 10.5 K/uL   RBC 4.83  4.22 - 5.81 MIL/uL   Hemoglobin 16.1  13.0 - 17.0 g/dL   HCT 47.0  39.0 - 52.0 %   MCV 97.3  78.0 - 100.0 fL   MCH 33.3  26.0 - 34.0 pg   MCHC 34.3  30.0 - 36.0 g/dL   RDW 12.6  11.5 - 15.5 %   Platelets 182  150 - 400 K/uL   Neutrophils Relative % 68  43 - 77 %   Neutro Abs 5.3  1.7 - 7.7 K/uL   Lymphocytes Relative 18  12 - 46 %   Lymphs Abs 1.4  0.7 - 4.0 K/uL   Monocytes Relative 11  3 - 12 %   Monocytes Absolute 0.8  0.1 - 1.0 K/uL   Eosinophils Relative 3  0 - 5 %   Eosinophils Absolute 0.2  0.0 - 0.7 K/uL   Basophils Relative 0  0 - 1 %   Basophils Absolute 0.0  0.0 - 0.1 K/uL  BASIC METABOLIC PANEL     Status: None   Collection Time    09/01/13  1:24 AM      Result Value Ref Range   Sodium 139  137 - 147 mEq/L   Potassium 4.0  3.7 - 5.3 mEq/L   Chloride 99  96 - 112 mEq/L   CO2 29  19 - 32 mEq/L   Glucose, Bld 89  70 - 99 mg/dL   BUN 11  6 - 23 mg/dL   Creatinine, Ser 0.79  0.50 - 1.35 mg/dL   Calcium 8.8  8.4 - 10.5 mg/dL   GFR calc non Af Amer >90  >90 mL/min   GFR calc Af Amer >90  >90 mL/min   Comment: (NOTE)     The eGFR has been calculated using the CKD EPI equation.     This calculation has not been validated in all clinical situations.     eGFR's persistently <90 mL/min signify possible Chronic Kidney     Disease.  PROTIME-INR     Status: Abnormal   Collection Time    09/01/13  1:24 AM      Result Value Ref Range   Prothrombin Time 25.4 (*) 11.6 - 15.2  seconds   INR 2.40 (*) 0.00 - 1.49    Dg Ankle Complete Left  09/01/2013   CLINICAL DATA:  Injury  EXAM: LEFT ANKLE COMPLETE - 3+ VIEW  COMPARISON:  None.  FINDINGS: Osteopenia. Bimalleolar fracture. Medial malleolus fracture occurs at the level of the tibial plafond. Lateral malleolus fracture begins at the tibial plafond then extends superiorly. Ankle mortise is anatomic. Soft tissue swelling over the medial malleolus is present.  IMPRESSION: Bimalleolar ankle fracture.   Electronically Signed   By: Maryclare Bean M.D.   On: 09/01/2013 02:12   Dg Foot Complete Left  09/01/2013   CLINICAL DATA:  Bruising, pain, and swelling after a fall.  EXAM: LEFT FOOT - COMPLETE 3+ VIEW  COMPARISON:  None.  FINDINGS: Diffuse bone demineralization of the left foot. Degenerative changes of the first metatarsal phalangeal and interphalangeal joints. No acute fracture or dislocation is appreciated. Dorsal soft tissue swelling. No radiopaque soft tissue foreign bodies. Plantar calcaneal spur.  IMPRESSION: Dorsal soft tissue swelling. Degenerative changes. No displaced fractures identified.   Electronically Signed   By: Lucienne Capers M.D.   On: 09/01/2013 02:08    Review of Systems  Constitutional: Negative.   HENT: Negative.   Eyes: Negative.   Respiratory: Negative.   Cardiovascular: Negative.   Gastrointestinal: Positive for blood in stool.  Genitourinary: Negative.   Musculoskeletal: Positive for falls and joint pain.  Skin: Negative.   Neurological: Negative.   Endo/Heme/Allergies: Negative.   Psychiatric/Behavioral: Negative.    Blood pressure 138/83, pulse 87, temperature 97.8 F (36.6 C), temperature source Oral, resp. rate 18, height 6' 5"  (1.956 m), weight 127.007 kg (280 lb), SpO2 96.00%. Physical Exam  Constitutional: He is oriented to person, place, and time. He appears well-developed.  HENT:  Head: Atraumatic.  Eyes: EOM are normal.  Cardiovascular: Normal rate and intact distal pulses.    LLE  cap refill less then 3 sec.  GI: Soft.  Genitourinary:  Deferred  Musculoskeletal:  Left LE in stirrup and posterior splint. Proximal  Calf soft and non tender.  Neurological: He is alert and oriented to person, place, and time.  Skin: Skin is warm and dry.  Psychiatric: His behavior is normal.    Assessment/Plan: Left bimalleolar ankle fracture.  Conservative treatment at this time. Will discuss with Dr. Doran Durand for further treatment options. Continue Splint. NWB to LLE for 6 weeks Up with PT Will follow. If cleared for D/c, then F/U in office in 2 weeks for casting. [phone]  May require SNF at D/c, social work to consult.  [name] 09/01/2013, 7:37 AM

## 2013-09-01 NOTE — Evaluation (Signed)
Physical Therapy Evaluation Patient Details Name: [name] MRN: [id number] DOB: 10-29-1950 Today's Date: 09/01/2013   History of Present Illness  63 yo male admitted with L bimalleolar ankle fx. hx of peripheral neuropathy, DVT, R ankle fx, Sz, PVD  Clinical Impression  On eval, pt required Min assist for bed mobility. Pt declined to attempt standing (despite encouragement from therapist). Recommend SNF.     Follow Up Recommendations SNF;Supervision/Assistance - 24 hour    Equipment Recommendations  Rolling walker with 5" wheels;Wheelchair (measurements PT);Wheelchair cushion (measurements PT)    Recommendations for Other Services OT consult     Precautions / Restrictions Precautions Precautions: Fall Required Braces or Orthoses: Other Brace/Splint Other Brace/Splint: L lower leg/ankle/foot Restrictions Weight Bearing Restrictions: Yes LLE Weight Bearing: Non weight bearing      Mobility  Bed Mobility Overal bed mobility: Needs Assistance Bed Mobility: Supine to Sit;Sit to Supine     Supine to sit: Min assist Sit to supine: Min assist   General bed mobility comments: Assist for L LE. Increased time. VCs safety, technique.   Transfers                 General transfer comment: Pt declined attempt at standing despite therapist's encouragement  Ambulation/Gait                Stairs            Wheelchair Mobility    Modified Rankin (Stroke Patients Only)       Balance Overall balance assessment: History of Falls;Needs assistance Sitting-balance support: Bilateral upper extremity supported Sitting balance-Leahy Scale: Good                                       Pertinent Vitals/Pain 10/10 L lower leg, ankle. Meds given during session.     Home Living Family/patient expects to be discharged to:: Private residence Living Arrangements: Other relatives (sister) Available Help at Discharge: Family Type of Home:  House Home Access: Stairs to enter Entrance Stairs-Rails: Left;Right;Can reach both Entrance Stairs-Number of Steps: 2 back Home Layout: Two level;Able to live on main level with bedroom/bathroom Home Equipment: Walker - 4 wheels      Prior Function Level of Independence: Independent with assistive device(s)               Hand Dominance        Extremity/Trunk Assessment   Upper Extremity Assessment: Defer to OT evaluation           Lower Extremity Assessment: LLE deficits/detail;RLE deficits/detail RLE Deficits / Details: hip flex at least 3/5, knee ext 4/5, moves ankle well    Cervical / Trunk Assessment: Normal  Communication   Communication: No difficulties  Cognition Arousal/Alertness: Awake/alert Behavior During Therapy: WFL for tasks assessed/performed Overall Cognitive Status: Within Functional Limits for tasks assessed                      General Comments      Exercises        Assessment/Plan    PT Assessment Patient needs continued PT services  PT Diagnosis Difficulty walking;Generalized weakness;Acute pain;Abnormality of gait   PT Problem List Decreased activity tolerance;Decreased mobility;Decreased balance;Pain;Decreased knowledge of use of DME;Decreased knowledge of precautions  PT Treatment Interventions DME instruction;Gait training;Functional mobility training;Therapeutic activities;Therapeutic exercise;Patient/family education;Balance training   PT Goals (Current goals can be found in the  Care Plan section) Acute Rehab PT Goals Patient Stated Goal: less pain. PT Goal Formulation: With patient Time For Goal Achievement: 09/15/13 Potential to Achieve Goals: Good    Frequency Min 3X/week   Barriers to discharge        Co-evaluation               End of Session   Activity Tolerance: Patient limited by pain Patient left: in bed;with call bell/phone within reach           Time: 1158-1208 PT Time Calculation  (min): 10 min   Charges:   PT Evaluation $Initial PT Evaluation Tier I: 1 Procedure PT Treatments $Therapeutic Activity: 8-22 mins   PT G Codes:          Weston Anna, MPT Pager: [phone]

## 2013-09-01 NOTE — Progress Notes (Signed)
PROGRESS NOTE  [name]:[id number] DOB: [birth year] DOA: Sep 16, 2013 PCP: Merrilee Seashore, MD  Assessment/Plan: Left ankle fracture - ortho consulted, appreciate input. Conservative management for now.  - PT recommends SNF, SW consulted Seizure disorder - continue home medications Recurrent DVT - Coumadin.  HLD - statin  Diet: heart Fluids: none DVT Prophylaxis: COumadin  Code Status: Full Family Communication: d/w patient  Disposition Plan: inpatient  Consultants:  Orthopedic surgery   Procedures:  none   Antibiotics - none  HPI/Subjective: No complaints this morning  Objective: Filed Vitals:   2013-09-16 0316 Sep 16, 2013 0450 09/16/2013 0529 09-16-13 1351  BP: 131/66 118/65 138/83 116/68  Pulse: 73 69 87 86  Temp: 97.6 F (36.4 C) 97.6 F (36.4 C) 97.8 F (36.6 C) 98.7 F (37.1 C)  TempSrc: Oral Oral Oral Oral  Resp: 20 18 18 19   Height:   6\' 5"  (1.956 m)   Weight:   127.007 kg (280 lb)   SpO2: 95% 95% 96% 93%    Intake/Output Summary (Last 24 hours) at 09/16/13 1427 Last data filed at 09-16-2013 1300  Gross per 24 hour  Intake    440 ml  Output    825 ml  Net   -385 ml   Filed Weights   09/16/13 0037 09/16/13 0529  Weight: 127.007 kg (280 lb) 127.007 kg (280 lb)   Exam:  General:  NAD  Cardiovascular: regular rate and rhythm, without MRG  Respiratory: good air movement, clear to auscultation throughout, no wheezing, ronchi or rales  Abdomen: soft, not tender to palpation, positive bowel sounds  MSK: trace peripheral edema  Neuro: non focal  Data Reviewed: Basic Metabolic Panel:  Recent Labs Lab 09/16/2013 0124  NA 139  K 4.0  CL 99  CO2 29  GLUCOSE 89  BUN 11  CREATININE 0.79  CALCIUM 8.8   Liver Function Tests: No results found for this basename: AST, ALT, ALKPHOS, BILITOT, PROT, ALBUMIN,  in the last 168 hours No results found for this basename: LIPASE, AMYLASE,  in the last 168 hours No results found for this  basename: AMMONIA,  in the last 168 hours CBC:  Recent Labs Lab 2013-09-16 0124  WBC 7.7  NEUTROABS 5.3  HGB 16.1  HCT 47.0  MCV 97.3  PLT 182   Cardiac Enzymes: No results found for this basename: CKTOTAL, CKMB, CKMBINDEX, TROPONINI,  in the last 168 hours BNP (last 3 results) No results found for this basename: PROBNP,  in the last 8760 hours CBG: No results found for this basename: GLUCAP,  in the last 168 hours  No results found for this or any previous visit (from the past 240 hour(s)).   Studies: Dg Ankle Complete Left  09/16/13   CLINICAL DATA:  Injury  EXAM: LEFT ANKLE COMPLETE - 3+ VIEW  COMPARISON:  None.  FINDINGS: Osteopenia. Bimalleolar fracture. Medial malleolus fracture occurs at the level of the tibial plafond. Lateral malleolus fracture begins at the tibial plafond then extends superiorly. Ankle mortise is anatomic. Soft tissue swelling over the medial malleolus is present.  IMPRESSION: Bimalleolar ankle fracture.   Electronically Signed   By: Maryclare Bean M.D.   On: 2013/09/16 02:12   Dg Foot Complete Left  Sep 16, 2013   CLINICAL DATA:  Bruising, pain, and swelling after a fall.  EXAM: LEFT FOOT - COMPLETE 3+ VIEW  COMPARISON:  None.  FINDINGS: Diffuse bone demineralization of the left foot. Degenerative changes of the first metatarsal phalangeal and interphalangeal joints. No acute  fracture or dislocation is appreciated. Dorsal soft tissue swelling. No radiopaque soft tissue foreign bodies. Plantar calcaneal spur.  IMPRESSION: Dorsal soft tissue swelling. Degenerative changes. No displaced fractures identified.   Electronically Signed   By: Lucienne Capers M.D.   On: 09/01/2013 02:08    Scheduled Meds: . docusate sodium  100 mg Oral BID  . furosemide  20 mg Oral Daily  . gabapentin  100 mg Oral BID  . levETIRAcetam  1,500 mg Oral TID  . [START ON 09/02/2013] phenytoin  300 mg Oral Once per day on Mon Wed Fri  . [START ON 09/03/2013] phenytoin  400 mg Oral Once per  day on Sun Tue Thu Sat  . simvastatin  10 mg Oral q1800  . sodium chloride  3 mL Intravenous Q12H  . tamsulosin  0.4 mg Oral Daily  . vitamin B-12  1,000 mcg Oral Daily   Continuous Infusions:   Principal Problem:   Bimalleolar ankle fracture Active Problems:   History of DVT (deep vein thrombosis)   Seizure   Ankle fracture   Time spent: 25  This note has been created with Surveyor, quantity. Any transcriptional errors are unintentional.   Marzetta Board, MD [facility] Pager [phone]. If 7 PM - 7 AM, please contact night-coverage at [email or web address], password [facility] 09/01/2013, 2:27 PM  LOS: 0 days

## 2013-09-01 NOTE — Progress Notes (Signed)
ANTICOAGULATION CONSULT NOTE - Initial Consult  Pharmacy Consult for warfarin Indication: Hx DVT  Allergies  Allergen Reactions  . Penicillins     Mother and brother have had severe allergic reactions, no personal reaction    Patient Measurements: Height: 6\' 5"  (195.6 cm) Weight: 280 lb (127.007 kg) IBW/kg (Calculated) : 89.1   Vital Signs: Temp: 98.7 F (37.1 C) (04/23 1351) Temp src: Oral (04/23 1351) BP: 116/68 mmHg (04/23 1351) Pulse Rate: 86 (04/23 1351)  Labs:  Recent Labs  09/01/13 0124  HGB 16.1  HCT 47.0  PLT 182  LABPROT 25.4*  INR 2.40*  CREATININE 0.79    Estimated Creatinine Clearance: 143.1 ml/min (by C-G formula based on Cr of 0.79).   Medical History: Past Medical History  Diagnosis Date  . Coronary artery disease   . Depression   . Peripheral vascular disease   . Seizures   . Deep vein thrombophlebitis of left leg   . Deep vein thrombosis of right lower extremity   . Psoriasis   . Arthritis   . DVT (deep venous thrombosis)     multiple times  . Weakness of both legs   . Peripheral neuropathy   . Venous stasis   . Hyperplasia of prostate   . BPH (benign prostatic hyperplasia)     Medications:  Scheduled:  . docusate sodium  100 mg Oral BID  . furosemide  20 mg Oral Daily  . gabapentin  100 mg Oral BID  . levETIRAcetam  1,500 mg Oral TID  . [START ON 09/02/2013] phenytoin  300 mg Oral Once per day on Mon Wed Fri  . [START ON 09/03/2013] phenytoin  400 mg Oral Once per day on Sun Tue Thu Sat  . simvastatin  10 mg Oral q1800  . sodium chloride  3 mL Intravenous Q12H  . tamsulosin  0.4 mg Oral Daily  . vitamin B-12  1,000 mcg Oral Daily  . warfarin  5 mg Oral ONCE-1800  . Warfarin - Pharmacist Dosing Inpatient   Does not apply q1800   Infusions:   PRN: acetaminophen, acetaminophen, HYDROcodone-acetaminophen, HYDROmorphone (DILAUDID) injection, hyoscyamine, ondansetron (ZOFRAN) IV, ondansetron  Assessment: 63 yo male on chronic  warfarin for hx of DVT, admitted with ankle fracture after tripping and falling at home. Home warfarin dosage reported by patient as 5mg  daily except 2.5mg  on Tues and Sat, last taken 4-22. INR therapeutic on admission (2.4). Per latest notes from ortho, non surgical management is anticipated.  Goal of Therapy:  INR 2-3    Plan:  1. Warfarin 5 mg PO x 1 at 1800 as per patient's usual regimen. 2. PT/INR daily while inpatient.  Clayburn Pert, PharmD, BCPS Pager: [phone] 09/01/2013  3:22 PM

## 2013-09-01 NOTE — ED Notes (Signed)
Per EMS: Pt was attempting to walk to bathroom with walker, tripped, fell to L side. C/o 10/10 L ankle pain. Hx of peripheral neuropathy and DVTs in both legs. Pain worse with passive ROM. Pt on warfarin for DVTs. Denies pain anywhere other than L leg, denies LOC, no head trauma. A&O x 4.

## 2013-09-01 NOTE — Progress Notes (Signed)
Clinical Social Work Department BRIEF PSYCHOSOCIAL ASSESSMENT 09/01/2013  Patient:  [name], [name]     Account Number:  [id number]     Admit date:  09/01/2013  Clinical Social Worker:  Earlie Server  Date/Time:  09/01/2013 02:00 PM  Referred by:  Physician  Date Referred:  09/01/2013 Referred for  SNF Placement   Other Referral:   Interview type:  Patient Other interview type:    PSYCHOSOCIAL DATA Living Status:  FAMILY Admitted from facility:   Level of care:   Primary support name:  Manuela Schwartz Primary support relationship to patient:  SIBLING Degree of support available:   Adequate    CURRENT CONCERNS Current Concerns  Post-Acute Placement   Other Concerns:    SOCIAL WORK ASSESSMENT / PLAN CSW received referral in order to assist with DC planning. CSW reviewed chart and met with patient at bedside. CSW introduced myself and explained role.    Patient reports he lives at home with his sister. Patient reports limited support from sister because she lives upstairs and he stays downstairs. Patient reports a difficult time during therapy today and is aware of PT recommendations for SNF placement. CSW provided patient with SNF list and explained process. Patient aware and reports he went to SNF about 3 years ago when he broke his other ankle. Patient agreeable to [facility] search and will review list.    CSW completed FL2 and faxed out. CSW will continue to follow.   Assessment/plan status:  Psychosocial Support/Ongoing Assessment of Needs Other assessment/ plan:   Information/referral to community resources:   SNF list    PATIENT'S/FAMILY'S RESPONSE TO PLAN OF CARE: Patient alert and oriented and engaged in assessment. Patient aware of SNF recommendation and has been in the past. Patient reports that SNF stay in the past was "bearable" and although he feels more comfortable at home, he knows that he needs additional assistance at DC. Patient has stayed at [facility]  in the past but wants to review list prior to making any decisions about where he will complete rehab. Patient thanked CSW for time and agreeable for additional follow up.       Sindy Messing, LCSW (Coverage for [employer])

## 2013-09-01 NOTE — Progress Notes (Signed)
Clinical Social Work Department CLINICAL SOCIAL WORK PLACEMENT NOTE 09/01/2013  Patient:  [name], [name]  Account Number:  [id number] Admit date:  09/01/2013  Clinical Social Worker:  Sindy Messing, LCSW  Date/time:  09/01/2013 02:00 PM  Clinical Social Work is seeking post-discharge placement for this patient at the following level of care:   [place]   (*CSW will update this form in Epic as items are completed)   09/01/2013  Patient/family provided with [place] Department of Clinical Social Work's list of facilities offering this level of care within the geographic area requested by the patient (or if unable, by the patient's family).  09/01/2013  Patient/family informed of their freedom to choose among providers that offer the needed level of care, that participate in Medicare, Medicaid or managed care program needed by the patient, have an available bed and are willing to accept the patient.  09/01/2013  Patient/family informed of [facility]' ownership interest in [facility], as well as of the fact that they are under no obligation to receive care at this facility.  PASARR submitted to EDS on existing # PASARR number received from EDS on   FL2 transmitted to all facilities in geographic area requested by pt/family on  09/01/2013 FL2 transmitted to all facilities within larger geographic area on   Patient informed that his/her managed care company has contracts with or will negotiate with  certain facilities, including the following:     Patient/family informed of bed offers received:   Patient chooses bed at  Physician recommends and patient chooses bed at    Patient to be transferred to  on   Patient to be transferred to facility by   The following physician request were entered in Epic:   Additional Comments:

## 2013-09-01 NOTE — Progress Notes (Signed)
ANTICOAGULATION CONSULT NOTE - Initial Consult  Pharmacy Consult for Warfarin Indication: Hx DVT  Allergies  Allergen Reactions  . Penicillins     Mother and brother have had severe allergic reactions, no personal reaction    Patient Measurements: Height: 6\' 5"  (195.6 cm) Weight: 280 lb (127.007 kg) IBW/kg (Calculated) : 89.1   Vital Signs: Temp: 97.8 F (36.6 C) (04/23 0529) Temp src: Oral (04/23 0529) BP: 138/83 mmHg (04/23 0529) Pulse Rate: 87 (04/23 0529)  Labs:  Recent Labs  09/01/13 0124  HGB 16.1  HCT 47.0  PLT 182  LABPROT 25.4*  INR 2.40*  CREATININE 0.79    Estimated Creatinine Clearance: 143.1 ml/min (by C-G formula based on Cr of 0.79).   Medical History: Past Medical History  Diagnosis Date  . Coronary artery disease   . Depression   . Peripheral vascular disease   . Seizures   . Deep vein thrombophlebitis of left leg   . Deep vein thrombosis of right lower extremity   . Psoriasis   . Arthritis   . DVT (deep venous thrombosis)     multiple times  . Weakness of both legs   . Peripheral neuropathy   . Venous stasis   . Hyperplasia of prostate   . BPH (benign prostatic hyperplasia)     Medications:  Scheduled:  . docusate sodium  100 mg Oral BID  . furosemide  20 mg Oral Daily  . gabapentin  100 mg Oral BID  . levETIRAcetam  1,500 mg Oral TID  . [START ON 09/02/2013] phenytoin  100 mg Oral 3 times per day on Mon Wed Fri  . phenytoin  100 mg Oral 4 times per day on Sun Tue Thu Sat  . simvastatin  10 mg Oral q1800  . sodium chloride  3 mL Intravenous Q12H  . tamsulosin  0.4 mg Oral Daily  . vitamin B-12  1,000 mcg Oral Daily   Infusions:   PRN: acetaminophen, acetaminophen, HYDROmorphone (DILAUDID) injection, hyoscyamine, ondansetron (ZOFRAN) IV, ondansetron  Assessment: 63 yo male on chronic warfarin for hx of dvt, admitted with ankle fracture after tripping and falling at home.  Home warfarin dosage as 5mg  daily except 2.5mg  on  Tues and Sat, last taken 4-22. INR therapeutic on admission (2.4). Per latest notes from ortho, non surgical management is anticipated. Goal of Therapy:  INR 2-3    Plan:  1. Warfarin 5mg  po today @ 1800, as per pt's usual regiment 2. Follow PT/INR daily while inpatient   Dolly Rias RPh 09/01/2013, 8:51 AM Pager [phone]

## 2013-09-01 NOTE — H&P (Signed)
[facility] History and Physical  [name] FGH:[id number] DOB: May 14, 1950 DOA: 09/01/2013  Referring physician: ER physician. PCP: Merrilee Seashore, MD   Chief Complaint: Fall.  HPI: [name] is a 63 y.o. male history of seizures and recurrent DVT on Coumadin, hyperlipidemia had a fall in his house after tripping on a walker. Denies having hit his head or losing consciousness. In the ER x-rays revealed left ankle bimalleolar fracture. Dr. Theda Sers on call orthopedic surgeon advise conservative management and since patient has difficulty walking with nonweightbearing at this time patient has been admitted for rehabilitation placement. Patient denies any chest pain shortness of breath palpitations nausea vomiting abdominal pain or diarrhea. Has noticed some blood in the stools off and on and patient has had a colonoscopy in 2013 which showed tubular adenoma with no high-grade dysplasia and also showed hemorrhoids.  Review of Systems: As presented in the history of presenting illness, rest negative.  Past Medical History  Diagnosis Date  . Coronary artery disease   . Depression   . Peripheral vascular disease   . Seizures   . Deep vein thrombophlebitis of left leg   . Deep vein thrombosis of right lower extremity   . Psoriasis   . Arthritis   . DVT (deep venous thrombosis)     multiple times  . Weakness of both legs   . Peripheral neuropathy   . Venous stasis   . Hyperplasia of prostate   . BPH (benign prostatic hyperplasia)    Past Surgical History  Procedure Laterality Date  . Colonoscopy  03/26/2012    Procedure: COLONOSCOPY;  Surgeon: Beryle Beams, MD;  Location: [facility];  Service: Endoscopy;  Laterality: N/A;  . Tonsillectomy      as child   Social History:  reports that he quit smoking about 31 years ago. His smoking use included Pipe. He does not have any smokeless tobacco history on file. He reports that he does not drink alcohol or use illicit  drugs. Where does patient live home. Can patient participate in ADLs? Not sure.  Allergies  Allergen Reactions  . Penicillins     Mother and brother have had severe allergic reactions, no personal reaction    Family History:  Family History  Problem Relation Age of Onset  . Vision loss Father   . Hypertension Father   . Diabetes Father   . Heart disease Father   . Cancer Father     colon  . Diabetes Mother   . Hypertension Mother   . Heart disease Mother   . Dementia Mother   . Cancer Mother     breast ca  . Diabetes Sister   . Hyperlipidemia Sister   . Hypertension Sister   . Diabetes Brother   . Kidney disease Brother   . Heart disease Brother       Prior to Admission medications   Medication Sig Start Date End Date Taking? Authorizing Provider  acetaminophen (TYLENOL) 500 MG tablet Take 500 mg by mouth 2 (two) times daily as needed for moderate pain.    Yes Historical Provider, MD  Calcium Carb-Cholecalciferol (CALCIUM-VITAMIN D) 600-400 MG-UNIT TABS Take 1 tablet by mouth daily.    Yes Historical Provider, MD  furosemide (LASIX) 20 MG tablet Take 20 mg by mouth daily. For 14 days only.   Yes Historical Provider, MD  gabapentin (NEURONTIN) 100 MG capsule Take 100 mg by mouth 2 (two) times daily.   Yes Historical Provider, MD  hyoscyamine (LEVSIN,  ANASPAZ) 0.125 MG tablet Take 0.125 mg by mouth every 4 (four) hours as needed for bladder spasms.   Yes Historical Provider, MD  levETIRAcetam (KEPPRA) 750 MG tablet Take 1,500 mg by mouth 3 (three) times daily.   Yes Historical Provider, MD  Multiple Vitamin (MULTIVITAMIN WITH MINERALS) TABS Take 1 tablet by mouth daily.   Yes Historical Provider, MD  phenytoin (DILANTIN) 100 MG ER capsule Take 100 mg by mouth as directed. Take 1 capsule (100mg ) three times a day on Monday,wednesday, & Friday Take 1 capsule (100mg ) four times a day on Tuesday,Thursday, Saturday, & Sunday   Yes Historical Provider, MD  pravastatin (PRAVACHOL)  20 MG tablet Take 20 mg by mouth daily.   Yes Historical Provider, MD  tamsulosin (FLOMAX) 0.4 MG CAPS capsule Take 0.4 mg by mouth daily. 03/21/13  Yes Historical Provider, MD  vitamin B-12 (CYANOCOBALAMIN) 1000 MCG tablet Take 1,000 mcg by mouth daily.   Yes Historical Provider, MD  warfarin (COUMADIN) 5 MG tablet Take 2.5-5 mg by mouth daily. Take 1 tablet (5mg ) daily EXCEPT for Tuesdays and Saturdays take 1/2 tablet (2.5mg )   Yes Historical Provider, MD    Physical Exam: Filed Vitals:   09/01/13 0037 09/01/13 0316 09/01/13 0450 09/01/13 0529  BP: 147/80 131/66 118/65 138/83  Pulse: 79 73 69 87  Temp: 97.7 F (36.5 C) 97.6 F (36.4 C) 97.6 F (36.4 C) 97.8 F (36.6 C)  TempSrc: Oral Oral Oral Oral  Resp: 20 20 18 18   Height: 6\' 5"  (1.956 m)   6\' 5"  (1.956 m)  Weight: 127.007 kg (280 lb)   127.007 kg (280 lb)  SpO2: 95% 95% 95% 96%     General:  Well developed and nourished.  Eyes: Anicteric no pallor.  ENT: No discharge from the ears eyes nose mouth.  Neck: No mass felt.  Cardiovascular: S1-S2 heard.  Respiratory: No rhonchi or crepitations.  Abdomen: Soft nontender bowel sounds present. No guarding or rigidity.  Skin: No rash.  Musculoskeletal: Left leg is in brace.  Psychiatric: Alert awake oriented to time place and person.  Neurologic: Moves all extremities.  Labs on Admission:  Basic Metabolic Panel:  Recent Labs Lab 09/01/13 0124  NA 139  K 4.0  CL 99  CO2 29  GLUCOSE 89  BUN 11  CREATININE 0.79  CALCIUM 8.8   Liver Function Tests: No results found for this basename: AST, ALT, ALKPHOS, BILITOT, PROT, ALBUMIN,  in the last 168 hours No results found for this basename: LIPASE, AMYLASE,  in the last 168 hours No results found for this basename: AMMONIA,  in the last 168 hours CBC:  Recent Labs Lab 09/01/13 0124  WBC 7.7  NEUTROABS 5.3  HGB 16.1  HCT 47.0  MCV 97.3  PLT 182   Cardiac Enzymes: No results found for this basename:  CKTOTAL, CKMB, CKMBINDEX, TROPONINI,  in the last 168 hours  BNP (last 3 results) No results found for this basename: PROBNP,  in the last 8760 hours CBG: No results found for this basename: GLUCAP,  in the last 168 hours  Radiological Exams on Admission: Dg Ankle Complete Left  09/01/2013   CLINICAL DATA:  Injury  EXAM: LEFT ANKLE COMPLETE - 3+ VIEW  COMPARISON:  None.  FINDINGS: Osteopenia. Bimalleolar fracture. Medial malleolus fracture occurs at the level of the tibial plafond. Lateral malleolus fracture begins at the tibial plafond then extends superiorly. Ankle mortise is anatomic. Soft tissue swelling over the medial malleolus is present.  IMPRESSION: Bimalleolar ankle fracture.   Electronically Signed   By: Maryclare Bean M.D.   On: 09/01/2013 02:12   Dg Foot Complete Left  09/01/2013   CLINICAL DATA:  Bruising, pain, and swelling after a fall.  EXAM: LEFT FOOT - COMPLETE 3+ VIEW  COMPARISON:  None.  FINDINGS: Diffuse bone demineralization of the left foot. Degenerative changes of the first metatarsal phalangeal and interphalangeal joints. No acute fracture or dislocation is appreciated. Dorsal soft tissue swelling. No radiopaque soft tissue foreign bodies. Plantar calcaneal spur.  IMPRESSION: Dorsal soft tissue swelling. Degenerative changes. No displaced fractures identified.   Electronically Signed   By: Lucienne Capers M.D.   On: 09/01/2013 02:08      Assessment/Plan Principal Problem:   Bimalleolar ankle fracture Active Problems:   History of DVT (deep vein thrombosis)   Seizure   Ankle fracture   1. Left ankle fracture - as per the ED physician who had spoken to Dr. Theda Sers, on call orthopedic surgeon, patient's ankle fracture is going to be managed conservatively. Physical therapy has been consulted and patient will need rehabilitation placement and followup with orthopedic. 2. Seizure disorder - continue present medications. 3. History of recurrent DVT - Coumadin per  pharmacy. 4. Hyperlipidemia - continue statins.  Patient is requesting Foley catheter placement. I have discussed with patient the adverse effects of doing so despite which patient wants to have a Foley catheter placed.  Code Status: Full code.  Family Communication: None.  Disposition Plan: Admit to inpatient.    [facility] Pager [phone].  If 7PM-7AM, please contact night-coverage [email or web address] Password [facility] 09/01/2013, 5:48 AM

## 2013-09-01 NOTE — Plan of Care (Signed)
Problem: Phase I Progression Outcomes Goal: Voiding-avoid urinary catheter unless indicated Outcome: Not Met (add Reason) Foley placed per patient request and MD order.

## 2013-09-01 NOTE — ED Notes (Signed)
Bed: XM14 Expected date: 09/01/13 Expected time: 12:24 AM Means of arrival: Ambulance Comments: Fall, ankle pain

## 2013-09-01 NOTE — Progress Notes (Signed)
Patient ID: [name], male   DOB: 19-May-1950, 63 y.o.   MRN: [id number]  Patient was discussed with Dr. Doran Durand by Dr.Collins. At this time we will proceed with conservative treatment. No surgery will be necessary if fracture remains stable. Patient is Non weight bearing on the LLE for 6 weeks. Remain in the current splint. F/u with Dr. Theda Sers in 2 weeks in the office. Patient to call for the appointment [phone]. PT for transferring, ambulating techniques, and use of assistive devives. SNF if recommended. Patient is able to D/C when cleared medically. Call with any further questions.

## 2013-09-01 NOTE — ED Notes (Signed)
Pt out of room for x-ray 

## 2013-09-01 NOTE — Progress Notes (Signed)
Orthopedic Tech Progress Note Patient Details:  [name] [birth year] [id number]  Ortho Devices Type of Ortho Device: Stirrup splint;Post (short leg) splint Ortho Device/Splint Interventions: Application   [name] Cammer 09/01/2013, 4:25 AM

## 2013-09-01 NOTE — ED Provider Notes (Signed)
CSN: [id number]     Arrival date & time 09/01/13  0035 History   First MD Initiated Contact with Patient 09/01/13 0103     Chief Complaint  Patient presents with  . Fall     (Consider location/radiation/quality/duration/timing/severity/associated sxs/prior Treatment) HPI  63 year old male presents to emergency room from home via EMS with complaint of left ankle pain.  Patient reports he had a mechanical fall, when he got tripped up by his feet and the walker and fell turning his left ankle.  Patient reports severe pain to the ankle.  He has not been able to bear weight on it since that time.  Patient is able to walk without his walker.  He has history of bilateral DVTs, peripheral neuropathy, and chronic venous stasis.  Other medical problems include coronary disease, depression, seizures, arthritis.  Patient has history of right ankle fracture in 2012 for which he needed a rehabilitation stay.  He did not require surgery for that fracture.  Patient did not strike his head, no LOC with this fall today. Past Medical History  Diagnosis Date  . Coronary artery disease   . Depression   . Peripheral vascular disease   . Seizures   . Deep vein thrombophlebitis of left leg   . Deep vein thrombosis of right lower extremity   . Psoriasis   . Arthritis   . DVT (deep venous thrombosis)     multiple times  . Weakness of both legs   . Peripheral neuropathy   . Venous stasis   . Hyperplasia of prostate   . BPH (benign prostatic hyperplasia)    Past Surgical History  Procedure Laterality Date  . Colonoscopy  03/26/2012    Procedure: COLONOSCOPY;  Surgeon: Beryle Beams, MD;  Location: [facility];  Service: Endoscopy;  Laterality: N/A;  . Tonsillectomy      as child   Family History  Problem Relation Age of Onset  . Vision loss Father   . Hypertension Father   . Diabetes Father   . Heart disease Father   . Cancer Father     colon  . Diabetes Mother   . Hypertension Mother   . Heart  disease Mother   . Dementia Mother   . Cancer Mother     breast ca  . Diabetes Sister   . Hyperlipidemia Sister   . Hypertension Sister   . Diabetes Brother   . Kidney disease Brother   . Heart disease Brother    History  Substance Use Topics  . Smoking status: Former Smoker    Types: Pipe    Quit date: 03/26/1982  . Smokeless tobacco: Not on file  . Alcohol Use: No    Review of Systems   See History of Present Illness; otherwise all other systems are reviewed and negative  Allergies  Penicillins  Home Medications   Prior to Admission medications   Medication Sig Start Date End Date Taking? Authorizing Provider  acetaminophen (TYLENOL) 500 MG tablet Take 500 mg by mouth 2 (two) times daily as needed for moderate pain.    Yes Historical Provider, MD  Calcium Carb-Cholecalciferol (CALCIUM-VITAMIN D) 600-400 MG-UNIT TABS Take 1 tablet by mouth daily.    Yes Historical Provider, MD  furosemide (LASIX) 20 MG tablet Take 20 mg by mouth daily. For 14 days only.   Yes Historical Provider, MD  gabapentin (NEURONTIN) 100 MG capsule Take 100 mg by mouth 2 (two) times daily.   Yes Historical Provider, MD  hyoscyamine (  LEVSIN, ANASPAZ) 0.125 MG tablet Take 0.125 mg by mouth every 4 (four) hours as needed for bladder spasms.   Yes Historical Provider, MD  levETIRAcetam (KEPPRA) 750 MG tablet Take 1,500 mg by mouth 3 (three) times daily.   Yes Historical Provider, MD  Multiple Vitamin (MULTIVITAMIN WITH MINERALS) TABS Take 1 tablet by mouth daily.   Yes Historical Provider, MD  phenytoin (DILANTIN) 100 MG ER capsule Take 100 mg by mouth as directed. Take 1 capsule (100mg ) three times a day on Monday,wednesday, & Friday Take 1 capsule (100mg ) four times a day on Tuesday,Thursday, Saturday, & Sunday   Yes Historical Provider, MD  pravastatin (PRAVACHOL) 20 MG tablet Take 20 mg by mouth daily.   Yes Historical Provider, MD  tamsulosin (FLOMAX) 0.4 MG CAPS capsule Take 0.4 mg by mouth daily.  03/21/13  Yes Historical Provider, MD  vitamin B-12 (CYANOCOBALAMIN) 1000 MCG tablet Take 1,000 mcg by mouth daily.   Yes Historical Provider, MD  warfarin (COUMADIN) 5 MG tablet Take 2.5-5 mg by mouth daily. Take 1 tablet (5mg ) daily EXCEPT for Tuesdays and Saturdays take 1/2 tablet (2.5mg )   Yes Historical Provider, MD   BP 118/65  Pulse 69  Temp(Src) 97.6 F (36.4 C) (Oral)  Resp 18  Ht 6\' 5"  (1.956 m)  Wt 280 lb (127.007 kg)  BMI 33.20 kg/m2  SpO2 95% Physical Exam  Nursing note and vitals reviewed. Constitutional: He is oriented to person, place, and time. He appears well-developed and well-nourished.  HENT:  Head: Normocephalic and atraumatic.  Right Ear: External ear normal.  Left Ear: External ear normal.  Nose: Nose normal.  Mouth/Throat: Oropharynx is clear and moist.  Eyes: Conjunctivae and EOM are normal. Pupils are equal, round, and reactive to light.  Neck: Normal range of motion. Neck supple. No JVD present. No tracheal deviation present. No thyromegaly present.  Cardiovascular: Normal rate, regular rhythm, normal heart sounds and intact distal pulses.  Exam reveals no gallop and no friction rub.   No murmur heard. Pulmonary/Chest: Effort normal and breath sounds normal. No stridor. No respiratory distress. He has no wheezes. He has no rales. He exhibits no tenderness.  Abdominal: Soft. Bowel sounds are normal. He exhibits no distension and no mass. There is no tenderness. There is no rebound and no guarding.  Musculoskeletal: Normal range of motion. He exhibits edema (patient has 2-3+ edema to both legs, he hhas asymmetrical edema left leg greater than right). He exhibits no tenderness.  Patient has swelling and bruising to left foot.  Pain with range of motion of left ankle.  Patient is neurovascularly intact at his baseline distally.  Lymphadenopathy:    He has no cervical adenopathy.  Neurological: He is alert and oriented to person, place, and time. He exhibits  normal muscle tone. Coordination normal.  Skin: Skin is warm and dry. No rash noted. No erythema. No pallor.  Psychiatric: He has a normal mood and affect. His behavior is normal. Judgment and thought content normal.    ED Course  Procedures (including critical care time) Labs Review Labs Reviewed  PROTIME-INR - Abnormal; Notable for the following:    Prothrombin Time 25.4 (*)    INR 2.40 (*)    All other components within normal limits  CBC WITH DIFFERENTIAL  BASIC METABOLIC PANEL    Imaging Review Dg Ankle Complete Left  09/01/2013   CLINICAL DATA:  Injury  EXAM: LEFT ANKLE COMPLETE - 3+ VIEW  COMPARISON:  None.  FINDINGS: Osteopenia. Bimalleolar  fracture. Medial malleolus fracture occurs at the level of the tibial plafond. Lateral malleolus fracture begins at the tibial plafond then extends superiorly. Ankle mortise is anatomic. Soft tissue swelling over the medial malleolus is present.  IMPRESSION: Bimalleolar ankle fracture.   Electronically Signed   By: Maryclare Bean M.D.   On: 09/01/2013 02:12   Dg Foot Complete Left  09/01/2013   CLINICAL DATA:  Bruising, pain, and swelling after a fall.  EXAM: LEFT FOOT - COMPLETE 3+ VIEW  COMPARISON:  None.  FINDINGS: Diffuse bone demineralization of the left foot. Degenerative changes of the first metatarsal phalangeal and interphalangeal joints. No acute fracture or dislocation is appreciated. Dorsal soft tissue swelling. No radiopaque soft tissue foreign bodies. Plantar calcaneal spur.  IMPRESSION: Dorsal soft tissue swelling. Degenerative changes. No displaced fractures identified.   Electronically Signed   By: Lucienne Capers M.D.   On: 09/01/2013 02:08     EKG Interpretation None      MDM   Final diagnoses:  Bimalleolar ankle fracture  Anticoagulated on Coumadin  History of DVT (deep vein thrombosis)  Fall at home    63 year old male status post mechanical fall at home.  He has a bimalleolar ankle fracture on the left.  He is  unable to ambulate without using a walker, cannot take care of himself at home, being nonweightbearing on the left lower extremity.  Case was discussed with Dr. Theda Sers, on-call for [facility].  Patient has been seen by Dr. Rolena Infante with [facility] orthopedics in the past, and wishes to continue to followup with them.  As patient cannot go home, and will need a rehabilitation stay, discuss with hospitalist, who will admit.   Kalman Drape, MD 09/01/13 [phone]

## 2013-09-02 ENCOUNTER — Other Ambulatory Visit: Payer: Self-pay | Admitting: *Deleted

## 2013-09-02 ENCOUNTER — Encounter: Payer: Self-pay | Admitting: *Deleted

## 2013-09-02 LAB — COMPREHENSIVE METABOLIC PANEL
ALBUMIN: 2.8 g/dL — AB (ref 3.5–5.2)
ALT: 24 U/L (ref 0–53)
AST: 22 U/L (ref 0–37)
Alkaline Phosphatase: 98 U/L (ref 39–117)
BUN: 10 mg/dL (ref 6–23)
CALCIUM: 8.2 mg/dL — AB (ref 8.4–10.5)
CO2: 27 mEq/L (ref 19–32)
CREATININE: 0.81 mg/dL (ref 0.50–1.35)
Chloride: 101 mEq/L (ref 96–112)
GFR calc Af Amer: >90 mL/min (ref >90)
GFR calc non Af Amer: >90 mL/min (ref >90)
Glucose, Bld: 91 mg/dL (ref 70–99)
Potassium: 4.1 mEq/L (ref 3.7–5.3)
Sodium: 137 mEq/L (ref 137–147)
TOTAL PROTEIN: 5.7 g/dL — AB (ref 6.0–8.3)
Total Bilirubin: 0.5 mg/dL (ref 0.3–1.2)

## 2013-09-02 LAB — CBC WITH DIFFERENTIAL/PLATELET
Basophils Absolute: 0 10*3/uL (ref 0.0–0.1)
Basophils Relative: 0 % (ref 0–1)
EOS PCT: 2 % (ref 0–5)
Eosinophils Absolute: 0.1 10*3/uL (ref 0.0–0.7)
HCT: 38.9 % — ABNORMAL LOW (ref 39.0–52.0)
HEMOGLOBIN: 13.7 g/dL (ref 13.0–17.0)
LYMPHS ABS: 1 10*3/uL (ref 0.7–4.0)
Lymphocytes Relative: 15 % (ref 12–46)
MCH: 32.3 pg (ref 26.0–34.0)
MCHC: 33.4 g/dL (ref 30.0–36.0)
MCV: 96.5 fL (ref 78.0–100.0)
MONOS PCT: 14 % — AB (ref 3–12)
Monocytes Absolute: 1 10*3/uL (ref 0.1–1.0)
Neutro Abs: 4.7 10*3/uL (ref 1.7–7.7)
Neutrophils Relative %: 69 % (ref 43–77)
Platelets: 160 10*3/uL (ref 150–400)
RBC: 4.03 MIL/uL — AB (ref 4.22–5.81)
RDW: 12.6 % (ref 11.5–15.5)
WBC: 6.9 10*3/uL (ref 4.0–10.5)

## 2013-09-02 LAB — PROTIME-INR
INR: 2.42 — ABNORMAL HIGH (ref 0.00–1.49)
Prothrombin Time: 25.5 seconds — ABNORMAL HIGH (ref 11.6–15.2)

## 2013-09-02 MED ORDER — WARFARIN SODIUM 5 MG PO TABS
5.0000 mg | ORAL_TABLET | Freq: Once | ORAL | Status: DC
Start: 2013-09-02 — End: 2013-09-02
  Filled 2013-09-02: qty 1

## 2013-09-02 MED ORDER — HYDROCODONE-ACETAMINOPHEN 5-325 MG PO TABS: 1.0000 | ORAL_TABLET | Freq: Four times a day (QID) | ORAL | Status: DC | PRN

## 2013-09-02 NOTE — Progress Notes (Signed)
Subjective: Patient seen and examined today. Reports pain in LLE. Tolerating Splint and PT well. Progressing with PT. No CP or SOB.    Objective: Vital signs in last 24 hours: Temp:  [98 F (36.7 C)-98.7 F (37.1 C)] 98 F (36.7 C) (04/24 0448) Pulse Rate:  [68-86] 68 (04/24 0448) Resp:  [16-19] 18 (04/24 0448) BP: (116-132)/(68-80) 127/71 mmHg (04/24 0448) SpO2:  [93 %-100 %] 96 % (04/24 0448)  Intake/Output from previous day: 04/23 0701 - 04/24 0700 In: 680 [P.O.:680] Out: 1975 [Urine:1975] Intake/Output this shift: Total I/O In: 240 [P.O.:240] Out: 600 [Urine:600]   Recent Labs  09/01/13 0124 09/02/13 0354  HGB 16.1 13.7    Recent Labs  09/01/13 0124 09/02/13 0354  WBC 7.7 6.9  RBC 4.83 4.03*  HCT 47.0 38.9*  PLT 182 160    Recent Labs  09/01/13 0124 09/02/13 0354  NA 139 137  K 4.0 4.1  CL 99 101  CO2 29 27  BUN 11 10  CREATININE 0.79 0.81  GLUCOSE 89 91  CALCIUM 8.8 8.2*    Recent Labs  09/01/13 0124 09/02/13 0354  INR 2.40* 2.42*    Alert and oriented x3. LLE in splint. Proximal calf soft and non tender. Knee has a normal exam. Cap refill less then 3 sec. Left toes are appropriate on exam.   Assessment/Plan: Left bimalleolar fracture: F/U in office in 2 weeks Remain in splint. NWB to LLE D/c to SNF   Sabryn Preslar L Younique Casad 09/02/2013, 1:09 PM

## 2013-09-02 NOTE — Discharge Summary (Signed)
Physician Discharge Summary  [name]:[id number] DOB: 1950/09/11 DOA: 09/01/2013  PCP: Merrilee Seashore, MD  Admit date: 09/01/2013 Discharge date: 09/02/2013  Time spent: 35 minutes  Recommendations for Outpatient Follow-up:  1. Follow up with orthopedic surgery  2. Follow up with PCP in 1-2 weeks 3. Follow up with Neurology as previously arranged   Discharge Diagnoses:  Principal Problem:   Bimalleolar ankle fracture Active Problems:   History of DVT (deep vein thrombosis)   Seizure   Ankle fracture   Fracture of ankle, closed  Discharge Condition: stable  Diet recommendation: heart healthy  Filed Weights   09/01/13 0037 09/01/13 0529  Weight: 127.007 kg (280 lb) 127.007 kg (280 lb)    History of present illness:  [name] is a 63 y.o. male history of seizures and recurrent DVT on Coumadin, hyperlipidemia had a fall in his house after tripping on a walker. Denies having hit his head or losing consciousness. In the ER x-rays revealed left ankle bimalleolar fracture. Dr. Theda Sers on call orthopedic surgeon advise conservative management and since patient has difficulty walking with nonweightbearing at this time patient has been admitted for rehabilitation placement. Patient denies any chest pain shortness of breath palpitations nausea vomiting abdominal pain or diarrhea. Has noticed some blood in the stools off and on and patient has had a colonoscopy in 2013 which showed tubular adenoma with no high-grade dysplasia and also showed hemorrhoids.  Hospital Course:  Left ankle fracture - ortho consulted, appreciate input, splint placed and will pursue conservative management for now. Patient will be discharged to SNF and will follow up with orthopedic surgery as an outpatient.  Seizure disorder - continue home medications  Recurrent DVT - continue Coumadin, monitor INR HLD - statin Neuropathy - continue Neurontin, patient has appointment with neurology in June.     Procedures:  none   Consultations:  Orthopedic surgery   Discharge Exam: Filed Vitals:   09/01/13 1351 09/01/13 2200 09/02/13 0225 09/02/13 0448  BP: 116/68 126/70 132/80 127/71  Pulse: 86 75 80 68  Temp: 98.7 F (37.1 C) 98.4 F (36.9 C) 98.5 F (36.9 C) 98 F (36.7 C)  TempSrc: Oral Oral Oral Oral  Resp: 19 18 16 18   Height:      Weight:      SpO2: 93% 98% 100% 96%    General: NAD Cardiovascular: RRR Respiratory: CTA biL  Discharge Instructions   Future Appointments Provider Department Dept Phone   10/12/2013 3:30 PM [name], MD [facility] [phone]       Medication List         acetaminophen 500 MG tablet  Commonly known as:  TYLENOL  Take 500 mg by mouth 2 (two) times daily as needed for moderate pain.     Calcium-Vitamin D 600-400 MG-UNIT Tabs  Take 1 tablet by mouth daily.     furosemide 20 MG tablet  Commonly known as:  LASIX  Take 20 mg by mouth daily. For 14 days only.     gabapentin 100 MG capsule  Commonly known as:  NEURONTIN  Take 100 mg by mouth 2 (two) times daily.     HYDROcodone-acetaminophen 5-325 MG per tablet  Commonly known as:  NORCO/VICODIN  Take 1-2 tablets by mouth every 6 (six) hours as needed for moderate pain.     hyoscyamine 0.125 MG tablet  Commonly known as:  LEVSIN, ANASPAZ  Take 0.125 mg by mouth every 4 (four) hours as needed for bladder spasms.  levETIRAcetam 750 MG tablet  Commonly known as:  KEPPRA  Take 1,500 mg by mouth 3 (three) times daily.     multivitamin with minerals Tabs tablet  Take 1 tablet by mouth daily.     phenytoin 100 MG ER capsule  Commonly known as:  DILANTIN  - Take 100 mg by mouth as directed. Take 1 capsule (100mg ) three times a day on Monday,wednesday, & Friday  - Take 1 capsule (100mg ) four times a day on Tuesday,Thursday, Saturday, & Sunday     pravastatin 20 MG tablet  Commonly known as:  PRAVACHOL  Take 20 mg by mouth daily.     tamsulosin  0.4 MG Caps capsule  Commonly known as:  FLOMAX  Take 0.4 mg by mouth daily.     vitamin B-12 1000 MCG tablet  Commonly known as:  CYANOCOBALAMIN  Take 1,000 mcg by mouth daily.     warfarin 5 MG tablet  Commonly known as:  COUMADIN  Take 2.5-5 mg by mouth daily. Take 1 tablet (5mg ) daily EXCEPT for Tuesdays and Saturdays take 1/2 tablet (2.5mg )         The results of significant diagnostics from this hospitalization (including imaging, microbiology, ancillary and laboratory) are listed below for reference.    Significant Diagnostic Studies: Dg Ankle Complete Left  09-19-13   CLINICAL DATA:  Injury  EXAM: LEFT ANKLE COMPLETE - 3+ VIEW  COMPARISON:  None.  FINDINGS: Osteopenia. Bimalleolar fracture. Medial malleolus fracture occurs at the level of the tibial plafond. Lateral malleolus fracture begins at the tibial plafond then extends superiorly. Ankle mortise is anatomic. Soft tissue swelling over the medial malleolus is present.  IMPRESSION: Bimalleolar ankle fracture.   Electronically Signed   By: Maryclare Bean M.D.   On: 2013/09/19 02:12   Dg Foot Complete Left  09-19-13   CLINICAL DATA:  Bruising, pain, and swelling after a fall.  EXAM: LEFT FOOT - COMPLETE 3+ VIEW  COMPARISON:  None.  FINDINGS: Diffuse bone demineralization of the left foot. Degenerative changes of the first metatarsal phalangeal and interphalangeal joints. No acute fracture or dislocation is appreciated. Dorsal soft tissue swelling. No radiopaque soft tissue foreign bodies. Plantar calcaneal spur.  IMPRESSION: Dorsal soft tissue swelling. Degenerative changes. No displaced fractures identified.   Electronically Signed   By: Lucienne Capers M.D.   On: 2013-09-19 02:08    Microbiology: No results found for this or any previous visit (from the past 240 hour(s)).   Labs: Basic Metabolic Panel:  Recent Labs Lab Sep 19, 2013 0124 09/02/13 0354  NA 139 137  K 4.0 4.1  CL 99 101  CO2 29 27  GLUCOSE 89 91  BUN 11  10  CREATININE 0.79 0.81  CALCIUM 8.8 8.2*   Liver Function Tests:  Recent Labs Lab 09/02/13 0354  AST 22  ALT 24  ALKPHOS 98  BILITOT 0.5  PROT 5.7*  ALBUMIN 2.8*   CBC:  Recent Labs Lab 09-19-2013 0124 09/02/13 0354  WBC 7.7 6.9  NEUTROABS 5.3 4.7  HGB 16.1 13.7  HCT 47.0 38.9*  MCV 97.3 96.5  PLT 182 160   Signed:  Kiptyn Rafuse M Chayne Baumgart  [facility] 09/02/2013, 10:38 AM

## 2013-09-02 NOTE — Progress Notes (Signed)
Clinical Social Work Department CLINICAL SOCIAL WORK PLACEMENT NOTE 09/02/2013  Patient:  [name], [name]  Account Number:  [id number] Admit date:  09/01/2013  Clinical Social Worker:  Sindy Messing, LCSW  Date/time:  09/01/2013 02:00 PM  Clinical Social Work is seeking post-discharge placement for this patient at the following level of care:   [facility]   (*CSW will update this form in Epic as items are completed)   09/01/2013  Patient/family provided with [place] Department of Clinical Social Work's list of facilities offering this level of care within the geographic area requested by the patient (or if unable, by the patient's family).  09/01/2013  Patient/family informed of their freedom to choose among providers that offer the needed level of care, that participate in Medicare, Medicaid or managed care program needed by the patient, have an available bed and are willing to accept the patient.  09/01/2013  Patient/family informed of [facility]' ownership interest in [facility], as well as of the fact that they are under no obligation to receive care at this facility.  PASARR submitted to [employer] on  PASARR number received from [facility] on   FL2 transmitted to all facilities in geographic area requested by pt/family on  09/01/2013 FL2 transmitted to all facilities within larger geographic area on   Patient informed that his/her managed care company has contracts with or will negotiate with  certain facilities, including the following:     Patient/family informed of bed offers received:  09/02/2013 Patient chooses bed at [facility] Physician recommends and patient chooses bed at    Patient to be transferred to [facility] on  09/02/2013 Patient to be transferred to facility by [facility]  The following physician request were entered in Epic:   Additional Comments: Pt has traditional Medicare not [employer] . Medicare will not cover  cost of SNF placement due to OBS status and pt not requiring 3 over nights in hospital. Pt / sister were made aware of this info and chose to pay out of pocket for SNF placement.   Werner Lean LCSW [phone]

## 2013-09-02 NOTE — Progress Notes (Signed)
Patient reporting "slow moving paralysis" of his body from his feet to his chest.  Claims the paralysis "slow moving like molasses" but his recent ankle fracture "has sped up the process."  He wanted me to know about it now before he becomes "unable to talk."  Patient says he is going to expire within "an hour."  Neuro assessment performed no abnormalities found except decreased sensation in lower extremities and decreased movement/strength in his left foot/leg (both of which were present on initial assessment).  No signs or symptoms of stroke found.  House supervisor called to visit patient at bedside for further evaluation.  Will continue to monitor.

## 2013-09-02 NOTE — Progress Notes (Signed)
Called By RN to come see patient.  Patient states that it is a slow progression but he is becoming paralyzed and that he is going to pass and needs to have priest called.  Patient can move all extremities and Vitals signs are WNL.  Patient denies use of ETOH.  Called and spoke with Dr. Wendee Beavers.  Patient was seen in the past by Dr. Erling Cruz, asked that the morning doctor talk to patient and possibly come see patient tomorrow.  Pt does have history of peripheral  neuropathy

## 2013-09-02 NOTE — Progress Notes (Signed)
Report called to deborah at [facility]

## 2013-09-02 NOTE — Telephone Encounter (Signed)
Error

## 2013-09-02 NOTE — Progress Notes (Signed)
ANTICOAGULATION CONSULT NOTE -   Pharmacy Consult for warfarin Indication: Hx DVT  Allergies  Allergen Reactions  . Penicillins     Mother and brother have had severe allergic reactions, no personal reaction    Patient Measurements: Height: 6\' 5"  (195.6 cm) Weight: 280 lb (127.007 kg) IBW/kg (Calculated) : 89.1   Vital Signs: Temp: 98 F (36.7 C) (04/24 0448) Temp src: Oral (04/24 0448) BP: 127/71 mmHg (04/24 0448) Pulse Rate: 68 (04/24 0448)  Labs:  Recent Labs  09/01/13 0124 09/02/13 0354  HGB 16.1 13.7  HCT 47.0 38.9*  PLT 182 160  LABPROT 25.4* 25.5*  INR 2.40* 2.42*  CREATININE 0.79 0.81    Estimated Creatinine Clearance: 141.3 ml/min (by C-G formula based on Cr of 0.81).   Medical History: Past Medical History  Diagnosis Date  . Coronary artery disease   . Depression   . Peripheral vascular disease   . Seizures   . Deep vein thrombophlebitis of left leg   . Deep vein thrombosis of right lower extremity   . Psoriasis   . Arthritis   . DVT (deep venous thrombosis)     multiple times  . Weakness of both legs   . Peripheral neuropathy   . Venous stasis   . Hyperplasia of prostate   . BPH (benign prostatic hyperplasia)     Medications:  Scheduled:  . docusate sodium  100 mg Oral BID  . furosemide  20 mg Oral Daily  . gabapentin  100 mg Oral BID  . levETIRAcetam  1,500 mg Oral TID  . phenytoin  300 mg Oral Once per day on Mon Wed Fri  . [START ON 09/03/2013] phenytoin  400 mg Oral Once per day on Sun Tue Thu Sat  . simvastatin  10 mg Oral q1800  . sodium chloride  3 mL Intravenous Q12H  . tamsulosin  0.4 mg Oral Daily  . vitamin B-12  1,000 mcg Oral Daily  . warfarin  5 mg Oral ONCE-1800  . Warfarin - Pharmacist Dosing Inpatient   Does not apply q1800   Infusions:   PRN: acetaminophen, acetaminophen, HYDROcodone-acetaminophen, HYDROmorphone (DILAUDID) injection, hyoscyamine, ondansetron (ZOFRAN) IV, ondansetron  Assessment: 63 yo male on  chronic warfarin for hx of DVT, admitted with ankle fracture after tripping and falling at home. Home warfarin dosage reported by patient as 5mg  daily except 2.5mg  on Tues and Sat, last taken 4-22. INR therapeutic on admission (2.4). Per latest notes from ortho, non surgical management is anticipated.  4-24  Warfarin 5mg  given yesterday  INR therapeutic  Hb and platelet count WNL  No bleeding reported  Goal of Therapy:  INR 2-3    Plan:  1. Warfarin 5 mg PO x 1 at 1800 as per patient's usual regimen. 2. PT/INR daily while inpatient.  Dolly Rias RPh 09/02/2013, 12:55 PM Pager [phone]

## 2013-09-02 NOTE — Progress Notes (Signed)
09/01/13 1345  PT Time Calculation  PT Start Time 1158  PT Stop Time 1208  PT Time Calculation (min) 10 min  PT G-Codes **NOT FOR INPATIENT CLASS**  Functional Assessment Tool Used (clinical judgement)  Functional Limitation Mobility: Walking and moving around  Mobility: Walking and Moving Around Current Status (P8099) CJ  Mobility: Walking and Moving Around Goal Status (I3382) CI  PT General Charges  $$ ACUTE PT VISIT 1 Procedure  PT Evaluation  $Initial PT Evaluation Tier I 1 Procedure  PT Treatments  $Therapeutic Activity 8-22 mins   Weston Anna, MPT [phone]

## 2013-09-02 NOTE — Telephone Encounter (Signed)
rx faxed to [facility] @ [phone]. 

## 2013-09-02 NOTE — Progress Notes (Signed)
OT Cancellation Note  Patient Details Name: JAMYRON REDD MRN: [id number] DOB: [birth year]   Cancelled Treatment:    Reason Eval/Treat Not Completed: Other (comment)  Spoke to SW.  Pt is planning snf for rehab and OT eval is not needed.  Will defer to SNF.  Lesle Chris 09/02/2013, 10:27 AM Lesle Chris, OTR/L [phone] 09/02/2013

## 2013-09-04 HISTORY — PX: ANKLE FRACTURE SURGERY: SHX122

## 2013-09-05 ENCOUNTER — Other Ambulatory Visit: Payer: Self-pay | Admitting: *Deleted

## 2013-09-05 ENCOUNTER — Non-Acute Institutional Stay (SKILLED_NURSING_FACILITY): Payer: Medicare Other | Admitting: Internal Medicine

## 2013-09-05 ENCOUNTER — Encounter: Payer: Self-pay | Admitting: Internal Medicine

## 2013-09-05 DIAGNOSIS — R569 Unspecified convulsions: Secondary | ICD-10-CM

## 2013-09-05 DIAGNOSIS — K5900 Constipation, unspecified: Secondary | ICD-10-CM

## 2013-09-05 DIAGNOSIS — E785 Hyperlipidemia, unspecified: Secondary | ICD-10-CM

## 2013-09-05 DIAGNOSIS — S82843A Displaced bimalleolar fracture of unspecified lower leg, initial encounter for closed fracture: Secondary | ICD-10-CM

## 2013-09-05 DIAGNOSIS — G609 Hereditary and idiopathic neuropathy, unspecified: Secondary | ICD-10-CM

## 2013-09-05 DIAGNOSIS — G629 Polyneuropathy, unspecified: Secondary | ICD-10-CM

## 2013-09-05 DIAGNOSIS — Z86718 Personal history of other venous thrombosis and embolism: Secondary | ICD-10-CM

## 2013-09-05 MED ORDER — HYDROCODONE-ACETAMINOPHEN 5-325 MG PO TABS: ORAL_TABLET | ORAL | Status: DC

## 2013-09-05 NOTE — Telephone Encounter (Signed)
[facility] 

## 2013-09-05 NOTE — Assessment & Plan Note (Signed)
-   ortho consulted, appreciate input, splint placed and will pursue conservative management for now. Patient will be discharged to SNF and will follow up with orthopedic surgery as an outpatient

## 2013-09-05 NOTE — Progress Notes (Signed)
MRN: [id number] Name: [name]  Sex: male Age: 63 y.o. DOB: 01/17/1951  [place] #: Helene Kelp Facility/Room: 107 Level Of Care: SNF Provider: Hennie Duos Emergency Contacts: Extended Emergency Contact Information Primary Emergency Contact: [employer] Address: [address]          [place], [place] [address] [name] of [place] Phone: [phone] Mobile Phone: [phone] Relation: Sister  Code Status: DNR  Allergies: Penicillins  Chief Complaint  Patient presents with  . nursing home admission    HPI: Patient is 63 y.o. male who has a bimalleolar fx, being managed conservatively who is admitted for OT/PT.  Past Medical History  Diagnosis Date  . Coronary artery disease   . Depression   . Peripheral vascular disease   . Seizures   . Deep vein thrombophlebitis of left leg   . Deep vein thrombosis of right lower extremity   . Psoriasis   . Arthritis   . DVT (deep venous thrombosis)     multiple times  . Weakness of both legs   . Peripheral neuropathy   . Venous stasis   . Hyperplasia of prostate   . BPH (benign prostatic hyperplasia)   . Hyperlipidemia     Past Surgical History  Procedure Laterality Date  . Colonoscopy  03/26/2012    Procedure: COLONOSCOPY;  Surgeon: Beryle Beams, MD;  Location: [facility];  Service: Endoscopy;  Laterality: N/A;  . Tonsillectomy      as child      Medication List       This list is accurate as of: 09/05/13 11:29 AM.  Always use your most recent med list.               acetaminophen 500 MG tablet  Commonly known as:  TYLENOL  Take 500 mg by mouth 2 (two) times daily as needed for moderate pain.     Calcium-Vitamin D 600-400 MG-UNIT Tabs  Take 1 tablet by mouth daily.     furosemide 20 MG tablet  Commonly known as:  LASIX  Take 20 mg by mouth daily. For 14 days only.     gabapentin 100 MG capsule  Commonly known as:  NEURONTIN  Take 100 mg by mouth 2 (two) times daily.     HYDROcodone-acetaminophen 5-325 MG per tablet  Commonly known as:  NORCO/VICODIN  Take two tablets by mouth every 6 hours as needed for pain     hyoscyamine 0.125 MG tablet  Commonly known as:  LEVSIN, ANASPAZ  Take 0.125 mg by mouth every 4 (four) hours as needed for bladder spasms.     levETIRAcetam 750 MG tablet  Commonly known as:  KEPPRA  Take 1,500 mg by mouth 3 (three) times daily.     multivitamin with minerals Tabs tablet  Take 1 tablet by mouth daily.     phenytoin 100 MG ER capsule  Commonly known as:  DILANTIN  - Take 100 mg by mouth as directed. Take 1 capsule (100mg ) three times a day on Monday,wednesday, & Friday  - Take 1 capsule (100mg ) four times a day on Tuesday,Thursday, Saturday, & Sunday     pravastatin 20 MG tablet  Commonly known as:  PRAVACHOL  Take 20 mg by mouth daily.     tamsulosin 0.4 MG Caps capsule  Commonly known as:  FLOMAX  Take 0.4 mg by mouth daily.     vitamin B-12 1000 MCG tablet  Commonly known as:  CYANOCOBALAMIN  Take 1,000 mcg by  mouth daily.     warfarin 5 MG tablet  Commonly known as:  COUMADIN  Take 2.5-5 mg by mouth daily. Take 1 tablet (5mg ) daily EXCEPT for Tuesdays and Saturdays take 1/2 tablet (2.5mg )        No orders of the defined types were placed in this encounter.     There is no immunization history on file for this patient.  History  Substance Use Topics  . Smoking status: Former Smoker    Types: Pipe    Quit date: 03/26/1982  . Smokeless tobacco: Not on file  . Alcohol Use: No    Family history is noncontributory    Review of Systems  DATA OBTAINED: from patient GENERAL: Feels well no fevers, fatigue, appetite changes SKIN: No itching, rash or wounds EYES: No eye pain, redness, discharge EARS: No earache, tinnitus, change in hearing NOSE: No congestion, drainage or bleeding  MOUTH/THROAT: No mouth or tooth pain, No sore throat, No difficulty chewing or swallowing  RESPIRATORY: No cough,  wheezing, SOB CARDIAC: No chest pain, palpitations,+ L lower extremity edema  GI: No abdominal pain, No N/V/D +constipation- no BM since Wed 5 days,MOM taken yesterday no help but he does have liquid stool when he urinates, No heartburn or reflux  GU: No dysuria, frequency or urgency, or incontinence  MUSCULOSKELETAL: No unrelieved bone/joint pain NEUROLOGIC: No headache, dizziness or focal weakness PSYCHIATRIC: No overt anxiety or sadness. Sleeps well. No behavior issue.   Filed Vitals:   09/05/13 1118  BP: 127/73  Pulse: 80  Temp: 98.5 F (36.9 C)  Resp: 18    Physical Exam  GENERAL APPEARANCE: Alert, conversant. Appropriately groomed. No acute distress, very pleasant.  SKIN: No diaphoresis rash, or wounds HEAD: Normocephalic, atraumatic  EYES: Conjunctiva/lids clear. Pupils round, reactive. EOMs intact.  EARS: External exam WNL, canals clear. Hearing grossly normal.  NOSE: No deformity or discharge.  MOUTH/THROAT: Lips w/o lesions  RESPIRATORY: Breathing is even, unlabored. Lung sounds are clear   CARDIOVASCULAR: Heart RRR no murmurs, rubs or gallops. ++L peripheral edema.   GASTROINTESTINAL: Abdomen is soft, non-tender, not distended w/ normal bowel sounds. No mass, ventral or inguinal hernia. No organomegally GENITOURINARY: Bladder non tender, not distended  MUSCULOSKELETAL: L ankle in cast NEUROLOGIC: Oriented X3. Cranial nerves 2-12 grossly intact. Moves all extremities no tremor. PSYCHIATRIC: Mood and affect appropriate to situation, no behavioral issues  Patient Active Problem List   Diagnosis Date Noted  . Peripheral neuropathy 09/05/2013  . Hyperlipidemia   . Bimalleolar ankle fracture 09/01/2013  . History of DVT (deep vein thrombosis) 09/01/2013  . Seizure 09/01/2013  . Ankle fracture 09/01/2013  . Fracture of ankle, closed 09/01/2013  . Internal hemorrhoids 10/06/2012  . External hemorrhoids 10/06/2012  . Anal skin tag 10/06/2012    CBC    Component  Value Date/Time   WBC 6.9 09/02/2013 0354   RBC 4.03* 09/02/2013 0354   HGB 13.7 09/02/2013 0354   HCT 38.9* 09/02/2013 0354   PLT 160 09/02/2013 0354   MCV 96.5 09/02/2013 0354   LYMPHSABS 1.0 09/02/2013 0354   MONOABS 1.0 09/02/2013 0354   EOSABS 0.1 09/02/2013 0354   BASOSABS 0.0 09/02/2013 0354    CMP     Component Value Date/Time   NA 137 09/02/2013 0354   K 4.1 09/02/2013 0354   CL 101 09/02/2013 0354   CO2 27 09/02/2013 0354   GLUCOSE 91 09/02/2013 0354   BUN 10 09/02/2013 0354   CREATININE 0.81 09/02/2013 0354  CALCIUM 8.2* 09/02/2013 0354   PROT 5.7* 09/02/2013 0354   ALBUMIN 2.8* 09/02/2013 0354   AST 22 09/02/2013 0354   ALT 24 09/02/2013 0354   ALKPHOS 98 09/02/2013 0354   BILITOT 0.5 09/02/2013 0354   GFRNONAA >90 09/02/2013 0354   GFRAA >90 09/02/2013 0354    Assessment and Plan  Bimalleolar ankle fracture - ortho consulted, appreciate input, splint placed and will pursue conservative management for now. Patient will be discharged to SNF and will follow up with orthopedic surgery as an outpatient   Seizure Continue dilantin and Keppra  History of DVT (deep vein thrombosis) Continue coumadin and monitor INRs  Hyperlipidemia Continue pravachol 20 mg  Peripheral neuropathy Continue neurontin 100 mg BID; pt has appt with neuro in [place]   CONSTIPATION- and probable impaction; rectal exam and disempaction if able;, rhen fleets emena;if results start miralax 17 gm daily  Hennie Duos, MD

## 2013-09-05 NOTE — Assessment & Plan Note (Signed)
Continue neurontin 100 mg BID; pt has appt with neuro in [place]

## 2013-09-05 NOTE — Assessment & Plan Note (Signed)
Continue coumadin and monitor INRs

## 2013-09-05 NOTE — Assessment & Plan Note (Signed)
Continue pravachol 20 mg 

## 2013-09-05 NOTE — Assessment & Plan Note (Signed)
Continue dilantin and Keppra

## 2013-09-19 ENCOUNTER — Encounter: Payer: Self-pay | Admitting: Internal Medicine

## 2013-09-19 ENCOUNTER — Non-Acute Institutional Stay (SKILLED_NURSING_FACILITY): Payer: Medicare Other | Admitting: Internal Medicine

## 2013-09-19 DIAGNOSIS — K5900 Constipation, unspecified: Secondary | ICD-10-CM

## 2013-09-19 DIAGNOSIS — S82843A Displaced bimalleolar fracture of unspecified lower leg, initial encounter for closed fracture: Secondary | ICD-10-CM

## 2013-09-19 DIAGNOSIS — F329 Major depressive disorder, single episode, unspecified: Secondary | ICD-10-CM

## 2013-09-19 DIAGNOSIS — G629 Polyneuropathy, unspecified: Secondary | ICD-10-CM

## 2013-09-19 DIAGNOSIS — F3289 Other specified depressive episodes: Secondary | ICD-10-CM

## 2013-09-19 DIAGNOSIS — F32A Depression, unspecified: Secondary | ICD-10-CM

## 2013-09-19 DIAGNOSIS — G609 Hereditary and idiopathic neuropathy, unspecified: Secondary | ICD-10-CM

## 2013-09-19 NOTE — Assessment & Plan Note (Signed)
Pt is getting inadequate pain control; have scheduled norco 5 mg q4.

## 2013-09-19 NOTE — Progress Notes (Signed)
MRN: [id number] Name: [name]  Sex: male Age: 63 y.o. DOB: Jul 19, 1950  [place] #: [facility] Facility/Room: 107 Level Of Care: SNF Provider: Hennie Duos Emergency Contacts: Extended Emergency Contact Information Primary Emergency Contact: [employer] Address: [address]          [place], [place] [address] [name] of [place] Phone: [phone] Mobile Phone: [phone] Relation: Sister  Code Status: DNR  Allergies: Penicillins  Chief Complaint  Patient presents with  . Medical Management of Chronic Issues    HPI: Patient is 63 y.o. male who is having several different issues per nursing.  Past Medical History  Diagnosis Date  . Coronary artery disease   . Depression   . Peripheral vascular disease   . Seizures   . Deep vein thrombophlebitis of left leg   . Deep vein thrombosis of right lower extremity   . Psoriasis   . Arthritis   . DVT (deep venous thrombosis)     multiple times  . Weakness of both legs   . Peripheral neuropathy   . Venous stasis   . Hyperplasia of prostate   . BPH (benign prostatic hyperplasia)   . Hyperlipidemia     Past Surgical History  Procedure Laterality Date  . Colonoscopy  03/26/2012    Procedure: COLONOSCOPY;  Surgeon: Beryle Beams, MD;  Location: [facility];  Service: Endoscopy;  Laterality: N/A;  . Tonsillectomy      as child      Medication List       This list is accurate as of: 09/19/13 10:26 PM.  Always use your most recent med list.               acetaminophen 500 MG tablet  Commonly known as:  TYLENOL  Take 500 mg by mouth 2 (two) times daily as needed for moderate pain.     Calcium-Vitamin D 600-400 MG-UNIT Tabs  Take 1 tablet by mouth daily.     furosemide 20 MG tablet  Commonly known as:  LASIX  Take 20 mg by mouth daily. For 14 days only.     gabapentin 100 MG capsule  Commonly known as:  NEURONTIN  Take 100 mg by mouth 2 (two) times daily.     HYDROcodone-acetaminophen  5-325 MG per tablet  Commonly known as:  NORCO/VICODIN  Take two tablets by mouth every 6 hours as needed for pain     hyoscyamine 0.125 MG tablet  Commonly known as:  LEVSIN, ANASPAZ  Take 0.125 mg by mouth every 4 (four) hours as needed for bladder spasms.     levETIRAcetam 750 MG tablet  Commonly known as:  KEPPRA  Take 1,500 mg by mouth 3 (three) times daily.     multivitamin with minerals Tabs tablet  Take 1 tablet by mouth daily.     phenytoin 100 MG ER capsule  Commonly known as:  DILANTIN  - Take 100 mg by mouth as directed. Take 1 capsule (100mg ) three times a day on Monday,wednesday, & Friday  - Take 1 capsule (100mg ) four times a day on Tuesday,Thursday, Saturday, & Sunday     pravastatin 20 MG tablet  Commonly known as:  PRAVACHOL  Take 20 mg by mouth daily.     tamsulosin 0.4 MG Caps capsule  Commonly known as:  FLOMAX  Take 0.4 mg by mouth daily.     vitamin B-12 1000 MCG tablet  Commonly known as:  CYANOCOBALAMIN  Take 1,000 mcg by mouth daily.  warfarin 5 MG tablet  Commonly known as:  COUMADIN  Take 2.5-5 mg by mouth daily. Take 1 tablet (5mg ) daily EXCEPT for Tuesdays and Saturdays take 1/2 tablet (2.5mg )        No orders of the defined types were placed in this encounter.     There is no immunization history on file for this patient.  History  Substance Use Topics  . Smoking status: Former Smoker    Types: Pipe    Quit date: 03/26/1982  . Smokeless tobacco: Not on file  . Alcohol Use: No    Review of Systems  DATA OBTAINED: from patient, nurse GENERAL: Feels well no fevers, fatigue, appetite changes SKIN: No itching, rash HEENT: No complaint RESPIRATORY: No cough, wheezing, SOB CARDIAC: No chest pain, palpitations, lower extremity edema  GI: No abdominal pain, No N/V/D; on;ly 1 BM since been at SNF, No heartburn or reflux  GU: No dysuria, frequency or urgency, or incontinence  MUSCULOSKELETAL: needs more pain  medication NEUROLOGIC: No headache, dizziness or focal weakness PSYCHIATRIC: nursing says depressed. Sleeps well.   Filed Vitals:   09/19/13 2208  BP: 110/67  Pulse: 62  Temp: 96.6 F (35.9 C)  Resp: 18    Physical Exam  GENERAL APPEARANCE: Alert, conversant. Appropriately groomed. No acute distress  SKIN: No diaphoresis rash HEENT: Unremarkable RESPIRATORY: Breathing is even, unlabored. Lung sounds are clear   CARDIOVASCULAR: Heart RRR no murmurs, rubs or gallops. trace peripheral edema  GASTROINTESTINAL: Abdomen is soft, non-tender, not distended w/ normal bowel sounds.  GENITOURINARY: Bladder non tender, not distended  MUSCULOSKELETAL: No abnormal joints or musculature NEUROLOGIC: Cranial nerves 2-12 grossly intact. Moves all extremities no tremor. PSYCHIATRIC: Mood and affect appropriate to situation, no behavioral issues  Patient Active Problem List   Diagnosis Date Noted  . Peripheral neuropathy 09/05/2013  . Hyperlipidemia   . Bimalleolar ankle fracture 09/01/2013  . History of DVT (deep vein thrombosis) 09/01/2013  . Seizure 09/01/2013  . Ankle fracture 09/01/2013  . Fracture of ankle, closed 09/01/2013  . Internal hemorrhoids 10/06/2012  . External hemorrhoids 10/06/2012  . Anal skin tag 10/06/2012    CBC    Component Value Date/Time   WBC 6.9 09/02/2013 0354   RBC 4.03* 09/02/2013 0354   HGB 13.7 09/02/2013 0354   HCT 38.9* 09/02/2013 0354   PLT 160 09/02/2013 0354   MCV 96.5 09/02/2013 0354   LYMPHSABS 1.0 09/02/2013 0354   MONOABS 1.0 09/02/2013 0354   EOSABS 0.1 09/02/2013 0354   BASOSABS 0.0 09/02/2013 0354    CMP     Component Value Date/Time   NA 137 09/02/2013 0354   K 4.1 09/02/2013 0354   CL 101 09/02/2013 0354   CO2 27 09/02/2013 0354   GLUCOSE 91 09/02/2013 0354   BUN 10 09/02/2013 0354   CREATININE 0.81 09/02/2013 0354   CALCIUM 8.2* 09/02/2013 0354   PROT 5.7* 09/02/2013 0354   ALBUMIN 2.8* 09/02/2013 0354   AST 22 09/02/2013 0354   ALT 24  09/02/2013 0354   ALKPHOS 98 09/02/2013 0354   BILITOT 0.5 09/02/2013 0354   GFRNONAA >90 09/02/2013 0354   GFRAA >90 09/02/2013 0354    Assessment and Plan  Bimalleolar ankle fracture Pt is getting inadequate pain control; have scheduled norco 5 mg q4.  Peripheral neuropathy Needs additional pain control here too. Will increase Neurontin to 300 mg TID    DEPRESSION - will start pt on prozac 20 mg po daily  CONSTIPATION- pt was put  on colace 100 BID several days ago;will start miralax 17 gms daily.   Hennie Duos, MD

## 2013-09-19 NOTE — Assessment & Plan Note (Signed)
Needs additional pain control here too. Will increase Neurontin to 300 mg TID

## 2013-10-04 ENCOUNTER — Encounter: Payer: Self-pay | Admitting: *Deleted

## 2013-10-12 ENCOUNTER — Ambulatory Visit: Payer: Self-pay | Admitting: Neurology

## 2013-10-14 ENCOUNTER — Non-Acute Institutional Stay (SKILLED_NURSING_FACILITY): Payer: Medicare Other | Admitting: Nurse Practitioner

## 2013-10-14 DIAGNOSIS — G629 Polyneuropathy, unspecified: Secondary | ICD-10-CM

## 2013-10-14 DIAGNOSIS — Z86718 Personal history of other venous thrombosis and embolism: Secondary | ICD-10-CM

## 2013-10-14 DIAGNOSIS — G609 Hereditary and idiopathic neuropathy, unspecified: Secondary | ICD-10-CM

## 2013-10-14 DIAGNOSIS — E785 Hyperlipidemia, unspecified: Secondary | ICD-10-CM

## 2013-10-14 DIAGNOSIS — S82899A Other fracture of unspecified lower leg, initial encounter for closed fracture: Secondary | ICD-10-CM

## 2013-10-14 DIAGNOSIS — K5900 Constipation, unspecified: Secondary | ICD-10-CM

## 2013-10-14 NOTE — Progress Notes (Signed)
Patient ID: [name], male   DOB: 05-30-1950, 63 y.o.   MRN: [id number]    Nursing Home Location:  [place] of Service: SNF (38)  PCP: Merrilee Seashore, MD  Allergies  Allergen Reactions  . Penicillins     Mother and brother have had severe allergic reactions, no personal reaction    Chief Complaint  Patient presents with  . Medical Management of Chronic Issues    HPI:  63 y.o. male history of seizures and recurrent DVT on Coumadin, hyperlipidemia had a fall in his house after tripping on a walker and was found to have left ankle bimalleolar fracture. Dr. Theda Sers on call orthopedic surgeon advise conservative management and since patient has difficulty walking with nonweightbearing and pt was admitted to [facility] for ongoing rehab. Overall pt is doing well. Constipation and has improved. Pain is fairly well controlled on medications. Staff without concerns at this time.   Review of Systems:  Review of Systems  Constitutional: Negative for malaise/fatigue.  Respiratory: Negative for cough and shortness of breath.   Cardiovascular: Negative for chest pain, palpitations and leg swelling.  Gastrointestinal: Negative for heartburn, abdominal pain, diarrhea and constipation.  Genitourinary: Negative for dysuria, urgency and frequency.  Musculoskeletal: Positive for falls, joint pain and myalgias.  Skin: Negative.   Neurological: Positive for tingling (neuropathy ). Negative for dizziness, weakness and headaches.  Psychiatric/Behavioral: Negative for depression. The patient is not nervous/anxious.      Past Medical History  Diagnosis Date  . Coronary artery disease   . Depression   . Peripheral vascular disease   . Seizures   . Deep vein thrombophlebitis of left leg   . Deep vein thrombosis of right lower extremity   . Psoriasis   . Arthritis   . DVT (deep venous thrombosis)     multiple times  . Weakness of both legs   . Peripheral  neuropathy   . Venous stasis   . Hyperplasia of prostate   . BPH (benign prostatic hyperplasia)   . Hyperlipidemia    Past Surgical History  Procedure Laterality Date  . Colonoscopy  03/26/2012    Procedure: COLONOSCOPY;  Surgeon: Beryle Beams, MD;  Location: [facility];  Service: Endoscopy;  Laterality: N/A;  . Tonsillectomy      as child   Social History:   reports that he quit smoking about 31 years ago. His smoking use included Pipe. He does not have any smokeless tobacco history on file. He reports that he does not drink alcohol or use illicit drugs.  Family History  Problem Relation Age of Onset  . Vision loss Father   . Hypertension Father   . Diabetes Father   . Heart disease Father   . Cancer Father     colon  . Diabetes Mother   . Hypertension Mother   . Heart disease Mother   . Dementia Mother   . Cancer Mother     breast ca  . Diabetes Sister   . Hyperlipidemia Sister   . Hypertension Sister   . Diabetes Brother   . Kidney disease Brother   . Heart disease Brother     Medications: Patient's Medications  New Prescriptions   No medications on file  Previous Medications   ACETAMINOPHEN (TYLENOL) 500 MG TABLET    Take 500 mg by mouth 2 (two) times daily as needed for moderate pain.    CALCIUM CARB-CHOLECALCIFEROL (CALCIUM-VITAMIN D) 600-400 MG-UNIT TABS  Take 1 tablet by mouth daily. For calcium supplement   DOCUSATE SODIUM (COLACE) 100 MG CAPSULE    Take 100 mg by mouth 2 (two) times daily.   FLUOXETINE (PROZAC) 20 MG CAPSULE    Take 20 mg by mouth daily. For depression   FUROSEMIDE (LASIX) 20 MG TABLET    Take 20 mg by mouth daily. For 14 days only.   GABAPENTIN (NEURONTIN) 300 MG CAPSULE    Take 300 mg by mouth 3 (three) times daily. For neuropathy   HYOSCYAMINE (LEVSIN, ANASPAZ) 0.125 MG TABLET    Take 0.125 mg by mouth every 4 (four) hours as needed for bladder spasms.   LEVETIRACETAM (KEPPRA) 750 MG TABLET    Take 1,500 mg by mouth 3 (three) times  daily.   MULTIPLE VITAMIN (MULTIVITAMIN WITH MINERALS) TABS    Take 1 tablet by mouth daily.   PHENYTOIN (DILANTIN) 100 MG ER CAPSULE    Take 100 mg by mouth as directed. Take 1 capsule (177m) three times a day on Monday,wednesday, & Friday Take 1 capsule (1031m four times a day on Tuesday,Thursday, Saturday, & Sunday   POLYETHYLENE GLYCOL (MIRALAX / GLYCOLAX) PACKET    Take 17 g by mouth daily. For constipation   PRAVASTATIN (PRAVACHOL) 20 MG TABLET    Take 20 mg by mouth daily. For HLD   TAMSULOSIN (FLOMAX) 0.4 MG CAPS CAPSULE    Take 0.4 mg by mouth daily. For BPH   VITAMIN B-12 (CYANOCOBALAMIN) 1000 MCG TABLET    Take 1,000 mcg by mouth daily. For supplement   WARFARIN (COUMADIN) 5 MG TABLET    Take 2.5-5 mg by mouth daily. Take 1 tablet (26m56mdaily EXCEPT for Tuesdays and Saturdays take 1/2 tablet (2.26mg66mModified Medications   Modified Medication Previous Medication   HYDROCODONE-ACETAMINOPHEN (NORCO/VICODIN) 5-325 MG PER TABLET HYDROcodone-acetaminophen (NORCO/VICODIN) 5-325 MG per tablet      Take 1 tablet by mouth every 4 (four) hours as needed for moderate pain. Take two tablets by mouth every 6 hours as needed for severe pain.    Take two tablets by mouth every 6 hours as needed for pain  Discontinued Medications   GABAPENTIN (NEURONTIN) 100 MG CAPSULE    Take 100 mg by mouth 2 (two) times daily.     Physical Exam:  Filed Vitals:   10/14/13 1038  BP: 142/56  Pulse: 74  Temp: 97.1 F (36.2 C)  Resp: 18  Weight: 278 lb (126.1 kg)    Physical Exam  Constitutional: He is oriented to person, place, and time and well-developed, well-nourished, and in no distress.  HENT:  Mouth/Throat: Oropharynx is clear and moist. No oropharyngeal exudate.  Eyes: Conjunctivae and EOM are normal. Pupils are equal, round, and reactive to light.  Neck: Normal range of motion. Neck supple.  Cardiovascular: Normal rate, regular rhythm and normal heart sounds.   Pulmonary/Chest: Effort normal  and breath sounds normal. No respiratory distress.  Abdominal: Soft. Bowel sounds are normal.  Musculoskeletal: He exhibits no edema and no tenderness.  Hard cast to left ankle   Neurological: He is alert and oriented to person, place, and time.  Skin: Skin is warm and dry.  Psychiatric: Affect normal.     Labs reviewed: Basic Metabolic Panel:  Recent Labs  09/01/13 0124 09/02/13 0354  NA 139 137  K 4.0 4.1  CL 99 101  CO2 29 27  GLUCOSE 89 91  BUN 11 10  CREATININE 0.79 0.81  CALCIUM 8.8 8.2*  Liver Function Tests:  Recent Labs  09/02/13 0354  AST 22  ALT 24  ALKPHOS 98  BILITOT 0.5  PROT 5.7*  ALBUMIN 2.8*   No results found for this basename: LIPASE, AMYLASE,  in the last 8760 hours No results found for this basename: AMMONIA,  in the last 8760 hours CBC:  Recent Labs  09/01/13 0124 09/02/13 0354  WBC 7.7 6.9  NEUTROABS 5.3 4.7  HGB 16.1 13.7  HCT 47.0 38.9*  MCV 97.3 96.5  PLT 182 160   BMP with Estimated GFR    Result: 09/19/2013 11:06 PM   ( Status: F )     C Sodium 139     135-145 mEq/L SLN   Potassium 4.4     3.5-5.3 mEq/L SLN   Chloride 106     96-112 mEq/L SLN   CO2 26     19-32 mEq/L SLN   Glucose 96     70-99 mg/dL SLN   BUN 12     6-23 mg/dL SLN   Creatinine 0.74     0.50-1.35 mg/dL SLN   Calcium 8.3   L 8.4-10.5 mg/dL SLN   Est GFR, African American >89      mL/min SLN   Est GFR, NonAfrican American >89      mL/min SLN C CBC with Diff    Result: 09/20/2013 8:13 AM   ( Status: F )       WBC 7.8     4.0-10.5 K/uL SLN   RBC 4.27     4.22-5.81 MIL/uL SLN   Hemoglobin 13.6     13.0-17.0 g/dL SLN   Hematocrit 39.5     39.0-52.0 % SLN   MCV 92.5     78.0-100.0 fL SLN   MCH 31.9     26.0-34.0 pg SLN   MCHC 34.4     30.0-36.0 g/dL SLN   RDW 13.1     11.5-15.5 % SLN   Platelet Count 266     150-400 K/uL SLN   Granulocyte % 63     43-77 % SLN   Absolute Gran 4.9     1.7-7.7 K/uL SLN   Lymph % 22     12-46 % SLN   Absolute Lymph 1.7       0.7-4.0 K/uL SLN   Mono % 8     3-12 % SLN   Absolute Mono 0.6     0.1-1.0 K/uL SLN   Eos % 6   H 0-5 % SLN   Absolute Eos 0.5     0.0-0.7 K/uL SLN   Baso % 1     0-1 % SLN   Absolute Baso 0.1     0.0-0.1 K/uL SLN   Smear Review Criteria for review not met  SLN   Lipid Profile    Result: 09/19/2013 11:06 PM   ( Status: F )       Cholesterol 125     0-200 mg/dL SLN C Triglyceride 74     <150 mg/dL SLN   HDL Cholesterol 37   L >39 mg/dL SLN   Total Chol/HDL Ratio 3.4      Ratio SLN   VLDL Cholesterol (Calc) 15     0-40 mg/dL SLN   LDL Cholesterol (Calc) 73     0-99 mg/dL SLN C Liver Profile    Result: 09/19/2013 11:06 PM   ( Status: F )       Bilirubin, Total 0.3  0.2-1.2 mg/dL SLN   Bilirubin, Direct 0.1     0.0-0.3 mg/dL SLN   Indirect Bilirubin 0.2     0.2-1.2 mg/dL SLN   Alkaline Phosphatase 126   H 39-117 U/L SLN   AST/SGOT 18     0-37 U/L SLN   ALT/SGPT 29     0-53 U/L SLN   Total Protein 5.2   L 6.0-8.3 g/dL SLN   Albumin 2.6   L 3.5-5.2 g/dL SLN Assessment/Plan 1. Peripheral neuropathy Improved with increase in neuronin   2. Ankle fracture Pain is stable, conts in hard cast following with ortho  3. History of DVT (deep vein thrombosis) conts on coumadin, following INR in nursing home  4. Hyperlipidemia -LDL stable, conts on Pravachol   5. Unspecified constipation Improved on colace and miralax

## 2013-10-17 LAB — LIPID PANEL
Cholesterol: 136 mg/dL (ref 0–200)
HDL: 45 mg/dL (ref 35–70)
LDL Cholesterol: 78 mg/dL
LDL/HDL RATIO: 3
TRIGLYCERIDES: 65 mg/dL (ref 40–160)

## 2013-10-17 LAB — CBC AND DIFFERENTIAL: WBC: 9 10^3/mL

## 2013-10-19 ENCOUNTER — Emergency Department (HOSPITAL_COMMUNITY)
Admission: EM | Admit: 2013-10-19 | Discharge: 2013-10-19 | Disposition: A | Discharge status: Home / Self Care | Payer: Medicare Other | Attending: Emergency Medicine | Admitting: Emergency Medicine

## 2013-10-19 ENCOUNTER — Emergency Department (HOSPITAL_COMMUNITY): Payer: Medicare Other

## 2013-10-19 ENCOUNTER — Encounter (HOSPITAL_COMMUNITY): Payer: Self-pay | Admitting: Emergency Medicine

## 2013-10-19 ENCOUNTER — Encounter: Payer: Self-pay | Admitting: Internal Medicine

## 2013-10-19 ENCOUNTER — Non-Acute Institutional Stay (SKILLED_NURSING_FACILITY): Payer: Medicare Other | Admitting: Internal Medicine

## 2013-10-19 DIAGNOSIS — N400 Enlarged prostate without lower urinary tract symptoms: Secondary | ICD-10-CM | POA: Insufficient documentation

## 2013-10-19 DIAGNOSIS — Z7901 Long term (current) use of anticoagulants: Secondary | ICD-10-CM | POA: Insufficient documentation

## 2013-10-19 DIAGNOSIS — K6289 Other specified diseases of anus and rectum: Secondary | ICD-10-CM | POA: Insufficient documentation

## 2013-10-19 DIAGNOSIS — I2510 Atherosclerotic heart disease of native coronary artery without angina pectoris: Secondary | ICD-10-CM | POA: Insufficient documentation

## 2013-10-19 DIAGNOSIS — G40909 Epilepsy, unspecified, not intractable, without status epilepticus: Secondary | ICD-10-CM | POA: Insufficient documentation

## 2013-10-19 DIAGNOSIS — Z880 Allergy status to penicillin: Secondary | ICD-10-CM | POA: Insufficient documentation

## 2013-10-19 DIAGNOSIS — Z87891 Personal history of nicotine dependence: Secondary | ICD-10-CM | POA: Insufficient documentation

## 2013-10-19 DIAGNOSIS — F3289 Other specified depressive episodes: Secondary | ICD-10-CM | POA: Insufficient documentation

## 2013-10-19 DIAGNOSIS — Z79899 Other long term (current) drug therapy: Secondary | ICD-10-CM | POA: Insufficient documentation

## 2013-10-19 DIAGNOSIS — R140 Abdominal distension (gaseous): Secondary | ICD-10-CM

## 2013-10-19 DIAGNOSIS — E785 Hyperlipidemia, unspecified: Secondary | ICD-10-CM | POA: Insufficient documentation

## 2013-10-19 DIAGNOSIS — R109 Unspecified abdominal pain: Secondary | ICD-10-CM

## 2013-10-19 DIAGNOSIS — R142 Eructation: Secondary | ICD-10-CM

## 2013-10-19 DIAGNOSIS — R141 Gas pain: Secondary | ICD-10-CM

## 2013-10-19 DIAGNOSIS — Z86718 Personal history of other venous thrombosis and embolism: Secondary | ICD-10-CM | POA: Insufficient documentation

## 2013-10-19 DIAGNOSIS — R143 Flatulence: Secondary | ICD-10-CM

## 2013-10-19 DIAGNOSIS — Z872 Personal history of diseases of the skin and subcutaneous tissue: Secondary | ICD-10-CM | POA: Insufficient documentation

## 2013-10-19 DIAGNOSIS — Z8739 Personal history of other diseases of the musculoskeletal system and connective tissue: Secondary | ICD-10-CM | POA: Insufficient documentation

## 2013-10-19 DIAGNOSIS — F329 Major depressive disorder, single episode, unspecified: Secondary | ICD-10-CM | POA: Insufficient documentation

## 2013-10-19 DIAGNOSIS — R1084 Generalized abdominal pain: Secondary | ICD-10-CM | POA: Insufficient documentation

## 2013-10-19 LAB — CBC WITH DIFFERENTIAL/PLATELET
Basophils Absolute: 0 10*3/uL (ref 0.0–0.1)
Basophils Relative: 0 % (ref 0–1)
EOS PCT: 3 % (ref 0–5)
Eosinophils Absolute: 0.2 10*3/uL (ref 0.0–0.7)
HCT: 41.5 % (ref 39.0–52.0)
HEMOGLOBIN: 14.2 g/dL (ref 13.0–17.0)
Lymphocytes Relative: 13 % (ref 12–46)
Lymphs Abs: 0.8 10*3/uL (ref 0.7–4.0)
MCH: 32.3 pg (ref 26.0–34.0)
MCHC: 34.2 g/dL (ref 30.0–36.0)
MCV: 94.5 fL (ref 78.0–100.0)
MONO ABS: 0.7 10*3/uL (ref 0.1–1.0)
MONOS PCT: 11 % (ref 3–12)
Neutro Abs: 4.9 10*3/uL (ref 1.7–7.7)
Neutrophils Relative %: 73 % (ref 43–77)
Platelets: 180 10*3/uL (ref 150–400)
RBC: 4.39 MIL/uL (ref 4.22–5.81)
RDW: 13 % (ref 11.5–15.5)
WBC: 6.7 10*3/uL (ref 4.0–10.5)

## 2013-10-19 LAB — COMPREHENSIVE METABOLIC PANEL
ALBUMIN: 2.5 g/dL — AB (ref 3.5–5.2)
ALT: 26 U/L (ref 0–53)
AST: 19 U/L (ref 0–37)
Alkaline Phosphatase: 132 U/L — ABNORMAL HIGH (ref 39–117)
BUN: 12 mg/dL (ref 6–23)
CHLORIDE: 101 meq/L (ref 96–112)
CO2: 23 mEq/L (ref 19–32)
CREATININE: 0.62 mg/dL (ref 0.50–1.35)
Calcium: 8.1 mg/dL — ABNORMAL LOW (ref 8.4–10.5)
GFR calc Af Amer: >90 mL/min (ref >90)
GFR calc non Af Amer: >90 mL/min (ref >90)
Glucose, Bld: 137 mg/dL — ABNORMAL HIGH (ref 70–99)
Potassium: 3.8 mEq/L (ref 3.7–5.3)
Sodium: 140 mEq/L (ref 137–147)
TOTAL PROTEIN: 6.2 g/dL (ref 6.0–8.3)
Total Bilirubin: <0.2 mg/dL — ABNORMAL LOW (ref 0.3–1.2)

## 2013-10-19 LAB — PRO B NATRIURETIC PEPTIDE: PRO B NATRI PEPTIDE: 79 pg/mL (ref 0–125)

## 2013-10-19 LAB — PROTIME-INR
INR: 1.83 — AB (ref 0.00–1.49)
Prothrombin Time: 20.6 seconds — ABNORMAL HIGH (ref 11.6–15.2)

## 2013-10-19 LAB — LACTIC ACID, PLASMA: Lactic Acid, Venous: 1.1 mmol/L (ref 0.5–2.2)

## 2013-10-19 LAB — LIPASE, BLOOD: LIPASE: 35 U/L (ref 11–59)

## 2013-10-19 MED ORDER — SODIUM CHLORIDE 0.9 % IV SOLN
1000.0000 mL | INTRAVENOUS | Status: DC
Start: 2013-10-19 — End: 2013-10-19
  Administered 2013-10-19: 1000 mL via INTRAVENOUS

## 2013-10-19 MED ORDER — CEFPODOXIME PROXETIL 100 MG PO TABS: 100.0000 mg | ORAL_TABLET | Freq: Two times a day (BID) | ORAL | Status: DC

## 2013-10-19 MED ORDER — ONDANSETRON HCL 4 MG/2ML IJ SOLN
4.0000 mg | Freq: Once | INTRAMUSCULAR | Status: AC
  Administered 2013-10-19: 4 mg via INTRAVENOUS
  Filled 2013-10-19: qty 2

## 2013-10-19 MED ORDER — BELLADONNA-OPIUM 16.2-30 MG RE SUPP: 30.0000 mg | Freq: Three times a day (TID) | RECTAL | Status: DC | PRN

## 2013-10-19 MED ORDER — DOCUSATE SODIUM 100 MG PO CAPS: 100.0000 mg | ORAL_CAPSULE | Freq: Two times a day (BID) | ORAL | Status: DC

## 2013-10-19 NOTE — ED Notes (Addendum)
Patient resides at [facility].  He reports one week of abd pain, distention.  ems reports abdomen is rigid on exam.  Patient with no reported n/v.  Patient with reported bm's.  No n/v.  Patient is alert and oriented.  Patient is in rehab for ankle fracture.  He also reports shooting pains into both legs.  He has hx of peripheral neuropathy as well

## 2013-10-19 NOTE — ED Notes (Signed)
The pt returned from c-t.  Unable to get a urine spec at this time.  Iv fluid running at 120ml/hr

## 2013-10-19 NOTE — Progress Notes (Signed)
MRN: [id number] Name: [name]  Sex: male Age: 63 y.o. DOB: May 01, 1951  [place] #: [facility] Kel[facility] Facility/Room: 107 Level Of Care: SNF Provider: Inocencio Homes D Emergency Contacts: Extended Emergency Contact Information Primary Emergency Contact: [employer] Address: [place], [place] [address] [name] of [place] Phone: [phone] Mobile Phone: [phone] Relation: Sister  Code Status:DNR   Allergies: Penicillins  Chief Complaint  Patient presents with  . Acute Visit    HPI: Patient is 63 y.o. male who nursing asked me to see because of abdominal pain and distention since yesterday.  Past Medical History  Diagnosis Date  . Coronary artery disease   . Depression   . Peripheral vascular disease   . Seizures   . Deep vein thrombophlebitis of left leg   . Deep vein thrombosis of right lower extremity   . Psoriasis   . Arthritis   . DVT (deep venous thrombosis)     multiple times  . Weakness of both legs   . Peripheral neuropathy   . Venous stasis   . Hyperplasia of prostate   . BPH (benign prostatic hyperplasia)   . Hyperlipidemia     Past Surgical History  Procedure Laterality Date  . Colonoscopy  03/26/2012    Procedure: COLONOSCOPY;  Surgeon: Beryle Beams, MD;  Location: [facility];  Service: Endoscopy;  Laterality: N/A;  . Tonsillectomy      as child      Medication List    Notice   Cannot display patient medications because the patient has not yet arrived.      No orders of the defined types were placed in this encounter.     There is no immunization history on file for this patient.  History  Substance Use Topics  . Smoking status: Former Smoker    Types: Pipe    Quit date: 03/26/1982  . Smokeless tobacco: Not on file  . Alcohol Use: No    Review of Systems  DATA OBTAINED: from patient, nurse GENERA no fevers, fatigue, dec appetite  SKIN: No itching, rash HEENT: No complaint RESPIRATORY:  No cough, wheezing, SOB CARDIAC: No chest pain, palpitations, lower extremity edema  GI: + diffuse abdominal pain, No N/V; loose stool for several day; + constipation about a week ago, No heartburn or reflux  GU: No dysuria, frequency or urgency, or incontinence  MUSCULOSKELETAL: No unrelieved bone/joint pain NEUROLOGIC: No headache, dizziness or focal weakness PSYCHIATRIC: No overt anxiety or sadness. Sleeps well.   Filed Vitals:   10/19/13 1901  BP: 127/74  Pulse: 71  Temp: 97.5 F (36.4 C)  Resp: 19    Physical Exam  GENERAL APPEARANCE: Alert, conversant. Appropriately groomed. No acute distress  SKIN: No diaphoresis rash, HEENT: Unremarkable RESPIRATORY: Breathing is even, unlabored. Lung sounds are clear   CARDIOVASCULAR: Heart RRR no murmurs, rubs or gallops. No peripheral edema  GASTROINTESTINAL: Abdomen is soft, R side tender, no guarding or rebound; + distended, typanetic Very decreased bowel sounds.  GENITOURINARY: Bladder non tender, not distended  MUSCULOSKELETAL: cast LLE NEUROLOGIC: Cranial nerves 2-12 grossly intact. Moves all extremities no tremor. PSYCHIATRIC: Mood and affect appropriate to situation, no behavioral issues  Patient Active Problem List   Diagnosis Date Noted  . Peripheral neuropathy 09/05/2013  . Hyperlipidemia   . Bimalleolar ankle fracture 09/01/2013  . History of DVT (deep vein thrombosis) 09/01/2013  . Seizure 09/01/2013  . Ankle fracture 09/01/2013  . Fracture  of ankle, closed 09/01/2013  . Internal hemorrhoids 10/06/2012  . External hemorrhoids 10/06/2012  . Anal skin tag 10/06/2012    CBC    Component Value Date/Time   WBC 6.7 10/19/2013 1629   RBC 4.39 10/19/2013 1629   HGB 14.2 10/19/2013 1629   HCT 41.5 10/19/2013 1629   PLT 180 10/19/2013 1629   MCV 94.5 10/19/2013 1629   LYMPHSABS 0.8 10/19/2013 1629   MONOABS 0.7 10/19/2013 1629   EOSABS 0.2 10/19/2013 1629   BASOSABS 0.0 10/19/2013 1629    CMP     Component Value  Date/Time   NA 140 10/19/2013 1629   K 3.8 10/19/2013 1629   CL 101 10/19/2013 1629   CO2 23 10/19/2013 1629   GLUCOSE 137* 10/19/2013 1629   BUN 12 10/19/2013 1629   CREATININE 0.62 10/19/2013 1629   CALCIUM 8.1* 10/19/2013 1629   PROT 6.2 10/19/2013 1629   ALBUMIN 2.5* 10/19/2013 1629   AST 19 10/19/2013 1629   ALT 26 10/19/2013 1629   ALKPHOS 132* 10/19/2013 1629   BILITOT <0.2* 10/19/2013 1629   GFRNONAA >90 10/19/2013 1629   GFRAA >90 10/19/2013 1629    Assessment and Plan  ABDOMINAL DISTENTION/PAIN- for several days, getting worse;pt grimances to palpation on R and BS are very decreased, unlike constipation. Obviously chief concern is appendicitis and unfortunately most efficient way to get a CBC, CMP and CT abd in a SNF is in the ED. Will send to ED for eval-pt understands rational.  Hennie Duos, MD

## 2013-10-19 NOTE — Discharge Instructions (Signed)
As discussed, your evaluation tonight has resulted in a diagnosis of proctitis. Is important to take all medication as directed, monitored carefully, and do not hesitate to develop new, or concerning changes in your condition.  Otherwise, please be sure to follow up with our gastroenterologist for further evaluation and management in one week.

## 2013-10-19 NOTE — ED Provider Notes (Signed)
CSN: [id number]     Arrival date & time 10/19/13  1602 History   First MD Initiated Contact with Patient 10/19/13 1611     Chief Complaint  Patient presents with  . Abdominal Pain      HPI  Patient presents from a rehabilitation facility with concerns of abdominal distention and discomfort. Symptoms began approximately one week ago, soon after arrival to that facility. He states that over the interval week he is having increasing pain, distention throughout the abdomen, though he continues to have bowel movements, no new fever, chest pain, other focal complaints. Patient notes that bowel movements are frequent, loose. No clear alleviating / exacerbating factors  Past Medical History  Diagnosis Date  . Coronary artery disease   . Depression   . Peripheral vascular disease   . Seizures   . Deep vein thrombophlebitis of left leg   . Deep vein thrombosis of right lower extremity   . Psoriasis   . Arthritis   . DVT (deep venous thrombosis)     multiple times  . Weakness of both legs   . Peripheral neuropathy   . Venous stasis   . Hyperplasia of prostate   . BPH (benign prostatic hyperplasia)   . Hyperlipidemia    Past Surgical History  Procedure Laterality Date  . Colonoscopy  03/26/2012    Procedure: COLONOSCOPY;  Surgeon: Beryle Beams, MD;  Location: [facility];  Service: Endoscopy;  Laterality: N/A;  . Tonsillectomy      as child   Family History  Problem Relation Age of Onset  . Vision loss Father   . Hypertension Father   . Diabetes Father   . Heart disease Father   . Cancer Father     colon  . Diabetes Mother   . Hypertension Mother   . Heart disease Mother   . Dementia Mother   . Cancer Mother     breast ca  . Diabetes Sister   . Hyperlipidemia Sister   . Hypertension Sister   . Diabetes Brother   . Kidney disease Brother   . Heart disease Brother    History  Substance Use Topics  . Smoking status: Former Smoker    Types: Pipe    Quit date:  03/26/1982  . Smokeless tobacco: Not on file  . Alcohol Use: No    Review of Systems  Constitutional:       Per HPI, otherwise negative  HENT:       Per HPI, otherwise negative  Respiratory:       Per HPI, otherwise negative  Cardiovascular:       Per HPI, otherwise negative  Gastrointestinal: Positive for nausea, abdominal pain and abdominal distention. Negative for vomiting and blood in stool.  Endocrine:       Negative aside from HPI  Genitourinary:       Neg aside from HPI   Musculoskeletal:       Per HPI, otherwise negative  Skin: Negative.   Neurological: Negative for syncope.      Allergies  Penicillins  Home Medications   Prior to Admission medications   Medication Sig Start Date End Date Taking? Authorizing Provider  acetaminophen (TYLENOL) 500 MG tablet Take 500 mg by mouth 2 (two) times daily as needed for moderate pain.    Yes Historical Provider, MD  Calcium Carb-Cholecalciferol (CALCIUM-VITAMIN D) 600-400 MG-UNIT TABS Take 1 tablet by mouth daily. For calcium supplement   Yes Historical Provider, MD  docusate sodium (COLACE)  100 MG capsule Take 100 mg by mouth 2 (two) times daily.   Yes Historical Provider, MD  FLUoxetine (PROZAC) 20 MG capsule Take 20 mg by mouth daily. For depression   Yes Historical Provider, MD  gabapentin (NEURONTIN) 300 MG capsule Take 300 mg by mouth 3 (three) times daily. For neuropathy   Yes Historical Provider, MD  HYDROcodone-acetaminophen (NORCO/VICODIN) 5-325 MG per tablet Take 1 tablet by mouth every 4 (four) hours as needed for moderate pain. Take two tablets by mouth every 6 hours as needed for severe pain. 09/05/13  Yes Tiffany L Reed, DO  hyoscyamine (LEVSIN, ANASPAZ) 0.125 MG tablet Take 0.125 mg by mouth every 4 (four) hours as needed for bladder spasms.   Yes Historical Provider, MD  levETIRAcetam (KEPPRA) 750 MG tablet Take 1,500 mg by mouth 3 (three) times daily.   Yes Historical Provider, MD  Multiple Vitamin  (MULTIVITAMIN WITH MINERALS) TABS Take 1 tablet by mouth daily.   Yes Historical Provider, MD  phenytoin (DILANTIN) 100 MG ER capsule Take 100 mg by mouth as directed. Take 1 capsule (100mg ) three times a day on Monday,wednesday, & Friday Take 1 capsule (100mg ) four times a day on Tuesday,Thursday, Saturday, & Sunday   Yes Historical Provider, MD  polyethylene glycol (MIRALAX / GLYCOLAX) packet Take 17 g by mouth daily. For constipation   Yes Historical Provider, MD  pravastatin (PRAVACHOL) 20 MG tablet Take 20 mg by mouth daily. For HLD   Yes Historical Provider, MD  tamsulosin (FLOMAX) 0.4 MG CAPS capsule Take 0.4 mg by mouth daily. For BPH 03/21/13  Yes Historical Provider, MD  vitamin B-12 (CYANOCOBALAMIN) 1000 MCG tablet Take 1,000 mcg by mouth daily. For supplement   Yes Historical Provider, MD  warfarin (COUMADIN) 6 MG tablet Take 6 mg by mouth daily.   Yes Historical Provider, MD   BP 118/68  Pulse 79  Temp(Src) 98.5 F (36.9 C) (Oral)  Resp 20  SpO2 94% Physical Exam  Nursing note and vitals reviewed. Constitutional: He is oriented to person, place, and time. He appears well-developed. No distress.  HENT:  Head: Normocephalic and atraumatic.  Eyes: Conjunctivae and EOM are normal.  Cardiovascular: Normal rate and regular rhythm.   Pulmonary/Chest: Effort normal. No stridor. No respiratory distress.  Abdominal: He exhibits distension. There is generalized tenderness. There is no rigidity, no rebound and no guarding.  No peritoneal, though distended abdomen.   Musculoskeletal: He exhibits no edema.  L distal LE in cast - orange  Neurological: He is alert and oriented to person, place, and time.  Skin: Skin is warm and dry.  Psychiatric: He has a normal mood and affect.    ED Course  Procedures (including critical care time) Labs Review Labs Reviewed  COMPREHENSIVE METABOLIC PANEL - Abnormal; Notable for the following:    Glucose, Bld 137 (*)    Calcium 8.1 (*)     Albumin 2.5 (*)    Alkaline Phosphatase 132 (*)    Total Bilirubin <0.2 (*)    All other components within normal limits  PROTIME-INR - Abnormal; Notable for the following:    Prothrombin Time 20.6 (*)    INR 1.83 (*)    All other components within normal limits  CBC WITH DIFFERENTIAL  LIPASE, BLOOD  PRO B NATRIURETIC PEPTIDE  LACTIC ACID, PLASMA  URINALYSIS, ROUTINE W REFLEX MICROSCOPIC    Imaging Review Ct Abdomen Pelvis Wo Contrast  10/19/2013   CLINICAL DATA:  One week of abdominal pain and distention,  rigid abdomen on exam, past history coronary artery disease, DVT, peripheral vascular disease, hyperlipidemia, former smoker  EXAM: CT ABDOMEN AND PELVIS WITHOUT CONTRAST  TECHNIQUE: Multidetector CT imaging of the abdomen and pelvis was performed following the standard protocol without IV contrast. Sagittal and coronal MPR images reconstructed from axial data set. Oral contrast was not administered.  COMPARISON:  03/10/2008  FINDINGS: Bibasilar atelectasis.  Cystic lesion within spleen, 5.3 x 4.1 cm, previously 3.9 x 3.2 cm.  Gallbladder contracted with single gallstone near neck.  Within limits of a nonenhanced exam, liver, spleen, pancreas, kidneys, and adrenal glands otherwise unremarkable.  Normal appendix.  Colon distended by gas and stool through the sigmoid colon.  Small amount of nonspecific infiltration is seen within the sigmoid mesocolon in the mid abdomen with presacral infiltration also identified, new.  Mild wall thickening of the proximal rectal wall is identified new since previous exam with minimal perirectal infiltration.  No additional colonic wall thickening identified.  Stomach and small bowel loops normal appearance.  Unremarkable bladder and ureters.  Minimally prominent inguinal canals BILATERAL containing fat.  No mass, adenopathy, ascites, or free intraperitoneal air.  Calcification within the LEFT external iliac and common femoral veins likely sequela of prior  thrombosis.  Diffuse osseous demineralization.  IMPRESSION: Colon is diffusely distended by gas and a small amount of fluid to the level of the proximal rectum where wall thickening and associated perirectal infiltration are identified.  Additional scattered edema in the presacral space and sigmoid mesocolon.  No definite evidence of obstruction, mass or sigmoid volvulus.  Findings may represent proctitis either from infection or inflammatory bowel disease, trauma not excluded, tumor considered unlikely due to the long segment of mild wall thickening.  Endoscopic assessment recommended.  Interval increase in size of a cystic lesion within the spleen since 2009.   Electronically Signed   By: Lavonia Dana M.D.   On: 10/19/2013 17:52   Update: On exam the patient is awake and alert, in no distress. Discussed the findings with him and his wife. Patient's multiple medical issues, and his need for Coumadin complicates possible therapy. With CT evidence of proctitis, patient will be started on suppositories, continued stool softeners, and short course of antibiotics. And monitor his is suboptimal given his Coumadin use, allergies, but should be appropriately No clinical evidence for C. difficile.   MDM   Final diagnoses:  Proctitis  Abdominal pain    Patient presents with rectal and abdominal discomfort. On exam he is awake, alert, hemodynamically stable, in no distress. Patient's evaluation demonstrates likely proctitis.  Otherwise labs are largely reassuring. Patient was discharged after initiation of therapy, with gastroenterology followup    Carmin Muskrat, MD 10/19/13 2101

## 2013-10-19 NOTE — ED Notes (Signed)
abd pain for approx one week and he feels like his insides are cramping no nauses or vomiting

## 2013-10-19 NOTE — ED Notes (Signed)
Cast on his lt lower extremity.  Fractured in April and he has been at [facility] since then for rehab.

## 2013-10-19 NOTE — ED Notes (Signed)
The pt has abd distention for 2-3 days.  He denies nausea and vomiting but spasms inside his abd

## 2013-10-20 ENCOUNTER — Other Ambulatory Visit: Payer: Self-pay | Admitting: *Deleted

## 2013-10-27 ENCOUNTER — Other Ambulatory Visit: Payer: Self-pay | Admitting: *Deleted

## 2013-10-27 MED ORDER — HYDROCODONE-ACETAMINOPHEN 5-325 MG PO TABS: ORAL_TABLET | ORAL | Status: DC

## 2013-10-27 NOTE — Telephone Encounter (Signed)
[facility] 

## 2013-11-25 ENCOUNTER — Non-Acute Institutional Stay (SKILLED_NURSING_FACILITY): Payer: Medicare Other | Admitting: Nurse Practitioner

## 2013-11-25 DIAGNOSIS — G609 Hereditary and idiopathic neuropathy, unspecified: Secondary | ICD-10-CM

## 2013-11-25 DIAGNOSIS — R109 Unspecified abdominal pain: Secondary | ICD-10-CM

## 2013-11-25 DIAGNOSIS — G629 Polyneuropathy, unspecified: Secondary | ICD-10-CM

## 2013-11-25 DIAGNOSIS — R569 Unspecified convulsions: Secondary | ICD-10-CM

## 2013-11-25 DIAGNOSIS — Z86718 Personal history of other venous thrombosis and embolism: Secondary | ICD-10-CM

## 2013-11-25 NOTE — Progress Notes (Signed)
Patient ID: [name], male   DOB: 05/24/1950, 63 y.o.   MRN: [id number]    Nursing Home Location:  [place] of Service: SNF (80)  PCP: Merrilee Seashore, MD  Allergies  Allergen Reactions  . Penicillins     Mother and brother have had severe allergic reactions, no personal reaction    Chief Complaint  Patient presents with  . Medical Management of Chronic Issues    HPI:  63 y.o. male history of seizures and recurrent DVT on Coumadin, hyperlipidemia had a fall in his house after tripping on a walker and was found to have left ankle bimalleolar fracture and is at [facility] for on-going rehab. Cast has been removed at [facility] time, conts to ofllow with orthopedics.  In the last month pt had trip to ED due to abdominal pain and distension. Questionable proctitis, was treated with antibiotic and stool regimen for constipation. Pt reports abdominal discomfort is unchanged at this time.   Review of Systems:  Review of Systems  Constitutional: Negative for fever, chills and malaise/fatigue.  Respiratory: Negative for cough and shortness of breath.   Cardiovascular: Negative for chest pain, palpitations and leg swelling.  Gastrointestinal: Positive for abdominal pain. Negative for heartburn, diarrhea and constipation.  Genitourinary: Negative for dysuria, urgency and frequency.  Musculoskeletal: Positive for myalgias.  Skin: Negative.   Neurological: Positive for tingling (neuropathy ). Negative for dizziness, weakness and headaches.  Psychiatric/Behavioral: Negative for depression. The patient is not nervous/anxious.      Past Medical History  Diagnosis Date  . Coronary artery disease   . Depression   . Peripheral vascular disease   . Seizures   . Deep vein thrombophlebitis of left leg   . Deep vein thrombosis of right lower extremity   . Psoriasis   . Arthritis   . DVT (deep venous thrombosis)     multiple times  . Weakness of both legs   .  Peripheral neuropathy   . Venous stasis   . Hyperplasia of prostate   . BPH (benign prostatic hyperplasia)   . Hyperlipidemia    Past Surgical History  Procedure Laterality Date  . Colonoscopy  03/26/2012    Procedure: COLONOSCOPY;  Surgeon: Beryle Beams, MD;  Location: [facility];  Service: Endoscopy;  Laterality: N/A;  . Tonsillectomy      as child   Social History:   reports that he quit smoking about 31 years ago. His smoking use included Pipe. He does not have any smokeless tobacco history on file. He reports that he does not drink alcohol or use illicit drugs.  Family History  Problem Relation Age of Onset  . Vision loss Father   . Hypertension Father   . Diabetes Father   . Heart disease Father   . Cancer Father     colon  . Diabetes Mother   . Hypertension Mother   . Heart disease Mother   . Dementia Mother   . Cancer Mother     breast ca  . Diabetes Sister   . Hyperlipidemia Sister   . Hypertension Sister   . Diabetes Brother   . Kidney disease Brother   . Heart disease Brother     Medications: Patient's Medications  New Prescriptions   No medications on file  Previous Medications   ACETAMINOPHEN (TYLENOL) 500 MG TABLET    Take 500 mg by mouth 2 (two) times daily as needed for moderate pain.    BELLADONNA-OPIUM (B&O  SUPPRETTES) 16.2-30 MG SUPPOSITORY    Place 1 suppository rectally every 8 (eight) hours as needed for pain.   CALCIUM CARB-CHOLECALCIFEROL (CALCIUM-VITAMIN D) 600-400 MG-UNIT TABS    Take 1 tablet by mouth daily. For calcium supplement   DOCUSATE SODIUM (COLACE) 100 MG CAPSULE    Take 1 capsule (100 mg total) by mouth 2 (two) times daily.   FLUOXETINE (PROZAC) 20 MG CAPSULE    Take 20 mg by mouth daily. For depression   GABAPENTIN (NEURONTIN) 300 MG CAPSULE    Take 600 mg by mouth 3 (three) times daily. For neuropathy   HYDROCODONE-ACETAMINOPHEN (NORCO/VICODIN) 5-325 MG PER TABLET    Take one tablet by mouth every four hours for pain    HYOSCYAMINE (LEVSIN, ANASPAZ) 0.125 MG TABLET    Take 0.125 mg by mouth every 4 (four) hours as needed for bladder spasms.   LEVETIRACETAM (KEPPRA) 750 MG TABLET    Take 1,500 mg by mouth 3 (three) times daily.   MULTIPLE VITAMIN (MULTIVITAMIN WITH MINERALS) TABS    Take 1 tablet by mouth daily.   PHENYTOIN (DILANTIN) 100 MG ER CAPSULE    Take 100 mg by mouth as directed. Take 1 capsule (100mg ) three times a day on Monday,wednesday, & Friday Take 1 capsule (100mg ) four times a day on Tuesday,Thursday, Saturday, & Sunday   POLYETHYLENE GLYCOL (MIRALAX / GLYCOLAX) PACKET    Take 17 g by mouth daily. For constipation   PRAVASTATIN (PRAVACHOL) 20 MG TABLET    Take 20 mg by mouth daily. For HLD   TAMSULOSIN (FLOMAX) 0.4 MG CAPS CAPSULE    Take 0.4 mg by mouth daily. For BPH   VITAMIN B-12 (CYANOCOBALAMIN) 1000 MCG TABLET    Take 1,000 mcg by mouth daily. For supplement   WARFARIN (COUMADIN) 6 MG TABLET    Take 6 mg by mouth daily.  Modified Medications   No medications on file  Discontinued Medications   CEFPODOXIME (VANTIN) 100 MG TABLET    Take 1 tablet (100 mg total) by mouth 2 (two) times daily.     Physical Exam: Physical Exam  Constitutional: He is oriented to person, place, and time and well-developed, well-nourished, and in no distress.  HENT:  Mouth/Throat: Oropharynx is clear and moist. No oropharyngeal exudate.  Eyes: Conjunctivae and EOM are normal. Pupils are equal, round, and reactive to light.  Neck: Normal range of motion. Neck supple.  Cardiovascular: Normal rate, regular rhythm and normal heart sounds.   Pulmonary/Chest: Effort normal and breath sounds normal. No respiratory distress.  Abdominal: Soft. Bowel sounds are normal. He exhibits no distension and no mass. There is no tenderness. There is no rebound and no guarding.  Obese abdomen  Musculoskeletal: He exhibits no edema and no tenderness.  Neurological: He is alert and oriented to person, place, and time.  Skin:  Skin is warm and dry.  Psychiatric: Affect normal.    Filed Vitals:   11/25/13 1235  BP: 124/42  Pulse: 76  Temp: 97.6 F (36.4 C)  Resp: 20  Weight: 264 lb (119.75 kg)      Labs reviewed: Basic Metabolic Panel:  Recent Labs  09/01/13 0124 09/02/13 0354 10/19/13 1629  NA 139 137 140  K 4.0 4.1 3.8  CL 99 101 101  CO2 29 27 23   GLUCOSE 89 91 137*  BUN 11 10 12   CREATININE 0.79 0.81 0.62  CALCIUM 8.8 8.2* 8.1*   Liver Function Tests:  Recent Labs  09/02/13 0354 10/19/13 1629  AST 22 19  ALT 24 26  ALKPHOS 98 132*  BILITOT 0.5 <0.2*  PROT 5.7* 6.2  ALBUMIN 2.8* 2.5*    Recent Labs  10/19/13 1629  LIPASE 35   No results found for this basename: AMMONIA,  in the last 8760 hours CBC:  Recent Labs  09/01/13 0124 09/02/13 0354 10/19/13 1629  WBC 7.7 6.9 6.7  NEUTROABS 5.3 4.7 4.9  HGB 16.1 13.7 14.2  HCT 47.0 38.9* 41.5  MCV 97.3 96.5 94.5  PLT 182 160 180   Imaging Review  Ct Abdomen Pelvis Wo Contrast  10/19/2013 CLINICAL DATA: One week of abdominal pain and distention, rigid abdomen on exam, past history coronary artery disease, DVT, peripheral vascular disease, hyperlipidemia, former smoker EXAM: CT ABDOMEN AND PELVIS WITHOUT CONTRAST TECHNIQUE: Multidetector CT imaging of the abdomen and pelvis was performed following the standard protocol without IV contrast. Sagittal and coronal MPR images reconstructed from axial data set. Oral contrast was not administered. COMPARISON: 03/10/2008 FINDINGS: Bibasilar atelectasis. Cystic lesion within spleen, 5.3 x 4.1 cm, previously 3.9 x 3.2 cm. Gallbladder contracted with single gallstone near neck. Within limits of a nonenhanced exam, liver, spleen, pancreas, kidneys, and adrenal glands otherwise unremarkable. Normal appendix. Colon distended by gas and stool through the sigmoid colon. Small amount of nonspecific infiltration is seen within the sigmoid mesocolon in the mid abdomen with presacral infiltration also  identified, new. Mild wall thickening of the proximal rectal wall is identified new since previous exam with minimal perirectal infiltration. No additional colonic wall thickening identified. Stomach and small bowel loops normal appearance. Unremarkable bladder and ureters. Minimally prominent inguinal canals BILATERAL containing fat. No mass, adenopathy, ascites, or free intraperitoneal air. Calcification within the LEFT external iliac and common femoral veins likely sequela of prior thrombosis. Diffuse osseous demineralization. IMPRESSION: Colon is diffusely distended by gas and a small amount of fluid to the level of the proximal rectum where wall thickening and associated perirectal infiltration are identified. Additional scattered edema in the presacral space and sigmoid mesocolon. No definite evidence of obstruction, mass or sigmoid volvulus. Findings may represent proctitis either from infection or inflammatory bowel disease, trauma not excluded, tumor considered unlikely due to the long segment of mild wall thickening. Endoscopic assessment recommended. Interval increase in size of a cystic lesion within the spleen since 2009. Electronically Signed By: Lavonia Dana M.D. On: 10/19/2013 17:52      Assessment/Plan 1. Peripheral neuropathy -unchanged, conts on gabapentin TID  2. History of DVT (deep vein thrombosis) -INR today was 1.5, will increase coumadin to 10 mg daily and recheck in 1 week   3. Seizure -conts on dilantin, will get level with next blood draw  4. Right sided abdominal pain -?proctits per imaging in ED, completed antibiotic and GI cocktail to help constipation/gas however no improvement with symptoms, will consult GI at this time

## 2013-12-02 ENCOUNTER — Non-Acute Institutional Stay (SKILLED_NURSING_FACILITY): Payer: Medicare Other | Admitting: Nurse Practitioner

## 2013-12-02 ENCOUNTER — Other Ambulatory Visit: Payer: Self-pay | Admitting: Gastroenterology

## 2013-12-02 DIAGNOSIS — Z86718 Personal history of other venous thrombosis and embolism: Secondary | ICD-10-CM

## 2013-12-02 LAB — POCT INR: INR: 1.1 (ref <=1.1)

## 2013-12-02 NOTE — Progress Notes (Signed)
Patient ID: [name], male   DOB: 1950-05-17, 63 y.o.   MRN: [id number]  Location: [facility] Provider:  Hassell Done, NP  Code Status:  DNR  Chief Complaint  Patient presents with  . Acute Visit    HPI:  63 year old male with hx of recurrent DVT on coumadin who is not therapeutic despite on-going increases in coumadin.   Review of Systems:  Review of Systems  Constitutional: Negative for fever, chills and malaise/fatigue.  Respiratory: Negative for cough and shortness of breath.   Cardiovascular: Negative for chest pain, palpitations and leg swelling.  Gastrointestinal: Positive for abdominal pain. Negative for heartburn, diarrhea and constipation.  Genitourinary: Negative for dysuria, urgency and frequency.  Musculoskeletal: Positive for myalgias.  Neurological: Negative for dizziness and weakness.  Psychiatric/Behavioral: Negative for depression. The patient is not nervous/anxious.     Medications: Patient's Medications  New Prescriptions   No medications on file  Previous Medications   ACETAMINOPHEN (TYLENOL) 500 MG TABLET    Take 500 mg by mouth 2 (two) times daily as needed for moderate pain.    CALCIUM CARB-CHOLECALCIFEROL (CALCIUM-VITAMIN D) 600-400 MG-UNIT TABS    Take 1 tablet by mouth daily. For calcium supplement   DOCUSATE SODIUM (COLACE) 100 MG CAPSULE    Take 1 capsule (100 mg total) by mouth 2 (two) times daily.   FLUOXETINE (PROZAC) 20 MG CAPSULE    Take 20 mg by mouth daily. For depression   HYDROCODONE-ACETAMINOPHEN (NORCO/VICODIN) 5-325 MG PER TABLET    Take one tablet by mouth every four hours for pain   HYOSCYAMINE (LEVSIN, ANASPAZ) 0.125 MG TABLET    Take 0.125 mg by mouth every 4 (four) hours as needed for bladder spasms.   LEVETIRACETAM (KEPPRA) 750 MG TABLET    Take 1,500 mg by mouth 3 (three) times daily. For seizures   MULTIPLE VITAMIN (MULTIVITAMIN WITH MINERALS) TABS    Take 1 tablet by mouth daily.   PHENYTOIN (DILANTIN) 100 MG ER CAPSULE     Take 100 mg by mouth as directed. Take 1 capsule (100mg ) three times a day on Monday,wednesday, & Friday Take 1 capsule (100mg ) four times a day on Tuesday,Thursday, Saturday, & Sunday   POLYETHYLENE GLYCOL (MIRALAX / GLYCOLAX) PACKET    Take 17 g by mouth daily. For constipation   PRAVASTATIN (PRAVACHOL) 20 MG TABLET    Take 20 mg by mouth daily. For HLD   TAMSULOSIN (FLOMAX) 0.4 MG CAPS CAPSULE    Take 0.4 mg by mouth daily. For BPH   VITAMIN B-12 (CYANOCOBALAMIN) 1000 MCG TABLET    Take 1,000 mcg by mouth daily. For supplement   WARFARIN (COUMADIN) 6 MG TABLET    Take 10 mg by mouth daily.   Modified Medications   No medications on file  Discontinued Medications   BELLADONNA-OPIUM (B&O SUPPRETTES) 16.2-30 MG SUPPOSITORY    Place 1 suppository rectally every 8 (eight) hours as needed for pain.   GABAPENTIN (NEURONTIN) 300 MG CAPSULE    Take 600 mg by mouth 3 (three) times daily. For neuropathy    Physical Exam: Vital Signs bp:128/42 HR: 76, RR: 18, Temp: 97.2  Physical Exam  Constitutional: He is oriented to person, place, and time and well-developed, well-nourished, and in no distress.  Cardiovascular: Normal rate, regular rhythm and normal heart sounds.   Pulmonary/Chest: Effort normal and breath sounds normal. No respiratory distress.  Abdominal: Soft. Bowel sounds are normal.  Musculoskeletal: He exhibits no edema and no tenderness.  Neurological:  He is alert and oriented to person, place, and time.  Skin: Skin is warm and dry.  Psychiatric: Affect normal.    Labs reviewed: Basic Metabolic Panel:  Recent Labs  09/01/13 0124 09/02/13 0354 10/19/13 1629  NA 139 137 140  K 4.0 4.1 3.8  CL 99 101 101  CO2 29 27 23   GLUCOSE 89 91 137*  BUN 11 10 12   CREATININE 0.79 0.81 0.62  CALCIUM 8.8 8.2* 8.1*    Liver Function Tests:  Recent Labs  09/02/13 0354 10/19/13 1629  AST 22 19  ALT 24 26  ALKPHOS 98 132*  BILITOT 0.5 <0.2*  PROT 5.7* 6.2  ALBUMIN 2.8* 2.5*     CBC:  Recent Labs  09/01/13 0124 09/02/13 0354 10/17/13 10/19/13 1629  WBC 7.7 6.9 9.0 6.7  NEUTROABS 5.3 4.7  --  4.9  HGB 16.1 13.7  --  14.2  HCT 47.0 38.9*  --  41.5  MCV 97.3 96.5  --  94.5  PLT 182 160  --  180     Lab Results  Component Value Date   INR 1.1 12/02/2013   INR 1.83* 10/19/2013   INR 2.42* 09/02/2013    Assessment/Plan  1. History of DVT (deep vein thrombosis) -pt with recent INRs of 1.1, 1.6, 1.5 despite increases in coumadin -at this time will dc coumadin and start Xarelto 20 mg daily for DVT prophylactic due to hx of DVT

## 2013-12-05 ENCOUNTER — Encounter (HOSPITAL_COMMUNITY): Payer: Self-pay | Admitting: Pharmacy Technician

## 2013-12-07 ENCOUNTER — Encounter (HOSPITAL_COMMUNITY): Payer: Self-pay | Admitting: *Deleted

## 2013-12-09 ENCOUNTER — Encounter (HOSPITAL_COMMUNITY)
Admission: RE | Disposition: A | Discharge status: Home / Self Care | Payer: Self-pay | Source: Ambulatory Visit | Attending: Gastroenterology

## 2013-12-09 ENCOUNTER — Ambulatory Visit (HOSPITAL_COMMUNITY)
Admission: RE | Admit: 2013-12-09 | Discharge: 2013-12-09 | Disposition: A | Discharge status: Home / Self Care | Payer: Medicare Other | Source: Ambulatory Visit | Attending: Gastroenterology | Admitting: Gastroenterology

## 2013-12-09 ENCOUNTER — Encounter (HOSPITAL_COMMUNITY): Payer: Self-pay

## 2013-12-09 DIAGNOSIS — N3289 Other specified disorders of bladder: Secondary | ICD-10-CM | POA: Diagnosis not present

## 2013-12-09 DIAGNOSIS — Z79899 Other long term (current) drug therapy: Secondary | ICD-10-CM | POA: Diagnosis not present

## 2013-12-09 DIAGNOSIS — K560 Paralytic ileus: Secondary | ICD-10-CM | POA: Insufficient documentation

## 2013-12-09 DIAGNOSIS — Z7901 Long term (current) use of anticoagulants: Secondary | ICD-10-CM | POA: Insufficient documentation

## 2013-12-09 DIAGNOSIS — G589 Mononeuropathy, unspecified: Secondary | ICD-10-CM | POA: Diagnosis not present

## 2013-12-09 DIAGNOSIS — R9389 Abnormal findings on diagnostic imaging of other specified body structures: Secondary | ICD-10-CM | POA: Diagnosis present

## 2013-12-09 DIAGNOSIS — F3289 Other specified depressive episodes: Secondary | ICD-10-CM | POA: Diagnosis not present

## 2013-12-09 DIAGNOSIS — N400 Enlarged prostate without lower urinary tract symptoms: Secondary | ICD-10-CM | POA: Diagnosis not present

## 2013-12-09 DIAGNOSIS — F329 Major depressive disorder, single episode, unspecified: Secondary | ICD-10-CM | POA: Insufficient documentation

## 2013-12-09 DIAGNOSIS — R569 Unspecified convulsions: Secondary | ICD-10-CM | POA: Insufficient documentation

## 2013-12-09 DIAGNOSIS — Z86718 Personal history of other venous thrombosis and embolism: Secondary | ICD-10-CM | POA: Insufficient documentation

## 2013-12-09 DIAGNOSIS — K5900 Constipation, unspecified: Secondary | ICD-10-CM | POA: Diagnosis not present

## 2013-12-09 DIAGNOSIS — E785 Hyperlipidemia, unspecified: Secondary | ICD-10-CM | POA: Diagnosis not present

## 2013-12-09 HISTORY — PX: FLEXIBLE SIGMOIDOSCOPY: SHX5431

## 2013-12-09 SURGERY — SIGMOIDOSCOPY, FLEXIBLE
Anesthesia: Moderate Sedation

## 2013-12-09 MED ORDER — FENTANYL CITRATE 0.05 MG/ML IJ SOLN
INTRAMUSCULAR | Status: AC
  Filled 2013-12-09: qty 4

## 2013-12-09 MED ORDER — LUBIPROSTONE 24 MCG PO CAPS: 24.0000 ug | ORAL_CAPSULE | Freq: Two times a day (BID) | ORAL | Status: DC

## 2013-12-09 MED ORDER — SODIUM CHLORIDE 0.9 % IV SOLN
INTRAVENOUS | Status: DC
  Administered 2013-12-09: 09:00:00 via INTRAVENOUS

## 2013-12-09 MED ORDER — MIDAZOLAM HCL 10 MG/2ML IJ SOLN
INTRAMUSCULAR | Status: AC
  Filled 2013-12-09: qty 4

## 2013-12-09 MED ORDER — MIDAZOLAM HCL 10 MG/2ML IJ SOLN
INTRAMUSCULAR | Status: DC | PRN
  Administered 2013-12-09 (×3): 2 mg via INTRAVENOUS

## 2013-12-09 MED ORDER — FENTANYL CITRATE 0.05 MG/ML IJ SOLN
INTRAMUSCULAR | Status: DC | PRN
  Administered 2013-12-09 (×2): 25 ug via INTRAVENOUS

## 2013-12-09 NOTE — Interval H&P Note (Signed)
History and Physical Interval Note:  12/09/2013 9:14 AM  [name]  has presented today for surgery, with the diagnosis of abnormal CT scan  The various methods of treatment have been discussed with the patient and family. After consideration of risks, benefits and other options for treatment, the patient has consented to  Procedure(s): FLEXIBLE SIGMOIDOSCOPY (N/A) as a surgical intervention .  The patient's history has been reviewed, patient examined, no change in status, stable for surgery.  I have reviewed the patient's chart and labs.  Questions were answered to the patient's satisfaction.     [name] D

## 2013-12-09 NOTE — H&P (View-Only) (Signed)
Patient ID: [name], male   DOB: 1950/12/24, 63 y.o.   MRN: [id number]  Location: [facility] Provider:  Hassell Done, NP  Code Status:  DNR  Chief Complaint  Patient presents with  . Acute Visit    HPI:  63 year old male with hx of recurrent DVT on coumadin who is not therapeutic despite on-going increases in coumadin.   Review of Systems:  Review of Systems  Constitutional: Negative for fever, chills and malaise/fatigue.  Respiratory: Negative for cough and shortness of breath.   Cardiovascular: Negative for chest pain, palpitations and leg swelling.  Gastrointestinal: Positive for abdominal pain. Negative for heartburn, diarrhea and constipation.  Genitourinary: Negative for dysuria, urgency and frequency.  Musculoskeletal: Positive for myalgias.  Neurological: Negative for dizziness and weakness.  Psychiatric/Behavioral: Negative for depression. The patient is not nervous/anxious.     Medications: Patient's Medications  New Prescriptions   No medications on file  Previous Medications   ACETAMINOPHEN (TYLENOL) 500 MG TABLET    Take 500 mg by mouth 2 (two) times daily as needed for moderate pain.    CALCIUM CARB-CHOLECALCIFEROL (CALCIUM-VITAMIN D) 600-400 MG-UNIT TABS    Take 1 tablet by mouth daily. For calcium supplement   DOCUSATE SODIUM (COLACE) 100 MG CAPSULE    Take 1 capsule (100 mg total) by mouth 2 (two) times daily.   FLUOXETINE (PROZAC) 20 MG CAPSULE    Take 20 mg by mouth daily. For depression   HYDROCODONE-ACETAMINOPHEN (NORCO/VICODIN) 5-325 MG PER TABLET    Take one tablet by mouth every four hours for pain   HYOSCYAMINE (LEVSIN, ANASPAZ) 0.125 MG TABLET    Take 0.125 mg by mouth every 4 (four) hours as needed for bladder spasms.   LEVETIRACETAM (KEPPRA) 750 MG TABLET    Take 1,500 mg by mouth 3 (three) times daily. For seizures   MULTIPLE VITAMIN (MULTIVITAMIN WITH MINERALS) TABS    Take 1 tablet by mouth daily.   PHENYTOIN (DILANTIN) 100 MG ER CAPSULE     Take 100 mg by mouth as directed. Take 1 capsule (100mg ) three times a day on Monday,wednesday, & Friday Take 1 capsule (100mg ) four times a day on Tuesday,Thursday, Saturday, & Sunday   POLYETHYLENE GLYCOL (MIRALAX / GLYCOLAX) PACKET    Take 17 g by mouth daily. For constipation   PRAVASTATIN (PRAVACHOL) 20 MG TABLET    Take 20 mg by mouth daily. For HLD   TAMSULOSIN (FLOMAX) 0.4 MG CAPS CAPSULE    Take 0.4 mg by mouth daily. For BPH   VITAMIN B-12 (CYANOCOBALAMIN) 1000 MCG TABLET    Take 1,000 mcg by mouth daily. For supplement   WARFARIN (COUMADIN) 6 MG TABLET    Take 10 mg by mouth daily.   Modified Medications   No medications on file  Discontinued Medications   BELLADONNA-OPIUM (B&O SUPPRETTES) 16.2-30 MG SUPPOSITORY    Place 1 suppository rectally every 8 (eight) hours as needed for pain.   GABAPENTIN (NEURONTIN) 300 MG CAPSULE    Take 600 mg by mouth 3 (three) times daily. For neuropathy    Physical Exam: Vital Signs bp:128/42 HR: 76, RR: 18, Temp: 97.2  Physical Exam  Constitutional: He is oriented to person, place, and time and well-developed, well-nourished, and in no distress.  Cardiovascular: Normal rate, regular rhythm and normal heart sounds.   Pulmonary/Chest: Effort normal and breath sounds normal. No respiratory distress.  Abdominal: Soft. Bowel sounds are normal.  Musculoskeletal: He exhibits no edema and no tenderness.  Neurological:  He is alert and oriented to person, place, and time.  Skin: Skin is warm and dry.  Psychiatric: Affect normal.    Labs reviewed: Basic Metabolic Panel:  Recent Labs  09/01/13 0124 09/02/13 0354 10/19/13 1629  NA 139 137 140  K 4.0 4.1 3.8  CL 99 101 101  CO2 29 27 23   GLUCOSE 89 91 137*  BUN 11 10 12   CREATININE 0.79 0.81 0.62  CALCIUM 8.8 8.2* 8.1*    Liver Function Tests:  Recent Labs  09/02/13 0354 10/19/13 1629  AST 22 19  ALT 24 26  ALKPHOS 98 132*  BILITOT 0.5 <0.2*  PROT 5.7* 6.2  ALBUMIN 2.8* 2.5*     CBC:  Recent Labs  09/01/13 0124 09/02/13 0354 10/17/13 10/19/13 1629  WBC 7.7 6.9 9.0 6.7  NEUTROABS 5.3 4.7  --  4.9  HGB 16.1 13.7  --  14.2  HCT 47.0 38.9*  --  41.5  MCV 97.3 96.5  --  94.5  PLT 182 160  --  180     Lab Results  Component Value Date   INR 1.1 12/02/2013   INR 1.83* 10/19/2013   INR 2.42* 09/02/2013    Assessment/Plan  1. History of DVT (deep vein thrombosis) -pt with recent INRs of 1.1, 1.6, 1.5 despite increases in coumadin -at this time will dc coumadin and start Xarelto 20 mg daily for DVT prophylactic due to hx of DVT

## 2013-12-09 NOTE — Op Note (Signed)
[facility] [place], [address]   FLEX SIGMOIDOSCOPY PROCEDURE REPORT  PATIENT: Marguis, Mathieson  MR#: [id number] BIRTHDATE: 1951-01-04 , 63  yrs. old GENDER: Male ENDOSCOPIST: Carol Ada, MD REFERRED BY: PROCEDURE DATE:  12/09/2013 PROCEDURE:   Sigmoidoscopy, diagnostic INDICATIONS:Abnormal CT scan. MEDICATIONS: Versed 6 mg IV and Fentanyl 50 mcg IV  DESCRIPTION OF PROCEDURE:    Physical exam was performed.  Informed consent was obtained from the patient after explaining the benefits, risks, and alternatives to procedure.  The patient was connected to monitor and placed in left lateral position. Continuous oxygen was provided by nasal cannula and IV medicine administered through an indwelling cannula.  After administration of sedation and rectal exam, the patients rectum was intubated and the     colonoscope was advanced under direct visualization to the cecum.  The scope was removed slowly by carefully examining the color, texture, anatomy, and integrity mucosa on the way out.  The patient was recovered in endoscopy and discharged home in satisfactory condition.       FINDINGS: The colonoscope was adavanced to the descending colon.Marland Kitchen Upon initial entry the colon was diffusely dilated.  There was spontaneous contractions of the colon.  No evidence of any inflammation, masses, or polyps.  PREP QUALITY: Good CECAL W/D TIME:  COMPLICATIONS: None  ENDOSCOPIC IMPRESSION: 1) Diffuse colonic distension with some spontaneous contractions. Clinically consistent with a colonic ileus.  The colonic mucosa appeared normal.   RECOMMENDATIONS: 1) Avoid any narcotic medications. 2) Trial of Amitiza 24 mcg BID. 3) Ambulation when possible.      _______________________________ eSigned:  Carol Ada, MD 12/09/2013 10:01 AM

## 2013-12-09 NOTE — Progress Notes (Signed)
Dr Benson Norway made aware that pts xeralto has not been held. Last dose 12/08/2013 at 1800.

## 2013-12-12 ENCOUNTER — Encounter (HOSPITAL_COMMUNITY): Payer: Self-pay | Admitting: Gastroenterology

## 2013-12-13 ENCOUNTER — Non-Acute Institutional Stay (SKILLED_NURSING_FACILITY): Payer: Medicare Other | Admitting: Internal Medicine

## 2013-12-13 DIAGNOSIS — E785 Hyperlipidemia, unspecified: Secondary | ICD-10-CM

## 2013-12-13 DIAGNOSIS — S82842D Displaced bimalleolar fracture of left lower leg, subsequent encounter for closed fracture with routine healing: Secondary | ICD-10-CM

## 2013-12-13 DIAGNOSIS — R569 Unspecified convulsions: Secondary | ICD-10-CM

## 2013-12-13 DIAGNOSIS — G629 Polyneuropathy, unspecified: Secondary | ICD-10-CM

## 2013-12-13 DIAGNOSIS — K639 Disease of intestine, unspecified: Secondary | ICD-10-CM

## 2013-12-13 DIAGNOSIS — Z86718 Personal history of other venous thrombosis and embolism: Secondary | ICD-10-CM

## 2013-12-13 DIAGNOSIS — E538 Deficiency of other specified B group vitamins: Secondary | ICD-10-CM

## 2013-12-13 DIAGNOSIS — IMO0001 Reserved for inherently not codable concepts without codable children: Secondary | ICD-10-CM

## 2013-12-13 DIAGNOSIS — G609 Hereditary and idiopathic neuropathy, unspecified: Secondary | ICD-10-CM

## 2013-12-13 NOTE — Progress Notes (Signed)
MRN: [id number] Name: [name]  Sex: male Age: 63 y.o. DOB: March 23, 1951  [place] #: [name] Facility/Room: 107 Level Of Care: SNF Provider: Inocencio Homes D Emergency Contacts: Extended Emergency Contact Information Primary Emergency Contact: [employer] Address: [address]          [place], [place] [address] [name] of [place] Phone: [phone] Mobile Phone: [phone] Relation: Sister  Code Status: DNR  Allergies: Penicillins  Chief Complaint  Patient presents with  . Medical Management of Chronic Issues    HPI: Patient is 63 y.o. male who is still rehabing a bimalleolar ankle fx.  Past Medical History  Diagnosis Date  . Coronary artery disease   . Depression   . Peripheral vascular disease   . Seizures   . Deep vein thrombophlebitis of left leg   . Deep vein thrombosis of right lower extremity   . Psoriasis   . Arthritis   . DVT (deep venous thrombosis)     multiple times  . Weakness of both legs   . Peripheral neuropathy   . Venous stasis   . Hyperplasia of prostate   . BPH (benign prostatic hyperplasia)   . Hyperlipidemia     Past Surgical History  Procedure Laterality Date  . Colonoscopy  03/26/2012    Procedure: COLONOSCOPY;  Surgeon: Beryle Beams, MD;  Location: [facility];  Service: Endoscopy;  Laterality: N/A;  . Tonsillectomy      as child  . Flexible sigmoidoscopy N/A 12/09/2013    Procedure: FLEXIBLE SIGMOIDOSCOPY;  Surgeon: Beryle Beams, MD;  Location: [facility];  Service: Endoscopy;  Laterality: N/A;      Medication List       This list is accurate as of: 12/13/13 11:59 PM.  Always use your most recent med list.               acetaminophen 500 MG tablet  Commonly known as:  TYLENOL  Take 500 mg by mouth 2 (two) times daily as needed for moderate pain.     Calcium-Vitamin D 600-400 MG-UNIT Tabs  Take 1 tablet by mouth daily. For calcium supplement     docusate sodium 100 MG capsule  Commonly known  as:  COLACE  Take 1 capsule (100 mg total) by mouth 2 (two) times daily.     FLUoxetine 20 MG capsule  Commonly known as:  PROZAC  Take 20 mg by mouth every morning. For depression     hyoscyamine 0.125 MG tablet  Commonly known as:  LEVSIN, ANASPAZ  Take 0.125 mg by mouth every 4 (four) hours as needed for bladder spasms.     levETIRAcetam 750 MG tablet  Commonly known as:  KEPPRA  Take 1,500 mg by mouth 3 (three) times daily. For seizures     lubiprostone 24 MCG capsule  Commonly known as:  AMITIZA  Take 1 capsule (24 mcg total) by mouth 2 (two) times daily with a meal.     multivitamin with minerals Tabs tablet  Take 1 tablet by mouth daily.     phenytoin 100 MG ER capsule  Commonly known as:  DILANTIN  Take 100 mg by mouth See admin instructions. Give 1 tablet by mouth four times daily on Tuesday, Thursday, Saturday, and Sunday. Give 1 tablet by mouth three times daily on Monday, Wednesday, and Friday.     polyethylene glycol packet  Commonly known as:  MIRALAX / GLYCOLAX  Take 17 g by mouth daily. For constipation  pravastatin 20 MG tablet  Commonly known as:  PRAVACHOL  Take 20 mg by mouth daily. For HLD     rivaroxaban 20 MG Tabs tablet  Commonly known as:  XARELTO  Take 20 mg by mouth daily with supper.     tamsulosin 0.4 MG Caps capsule  Commonly known as:  FLOMAX  Take 0.4 mg by mouth daily. For BPH     vitamin B-12 1000 MCG tablet  Commonly known as:  CYANOCOBALAMIN  Take 1,000 mcg by mouth daily. For supplement        No orders of the defined types were placed in this encounter.     There is no immunization history on file for this patient.  History  Substance Use Topics  . Smoking status: Former Smoker    Types: Pipe    Quit date: 03/26/1982  . Smokeless tobacco: Not on file  . Alcohol Use: No    Review of Systems  DATA OBTAINED: from patient GENERAL: Feels well no fevers, fatigue, appetite changes SKIN: No itching, rash HEENT: No  complaint RESPIRATORY: No cough, wheezing, SOB CARDIAC: No chest pain, palpitations, lower extremity edema  GI: No abdominal pain, No N/V/D; having trouble with constipation; No heartburn or reflux  GU: No dysuria, frequency or urgency, or incontinence  MUSCULOSKELETAL: still some ankle pain but better NEUROLOGIC: No headache, dizziness or focal weakness PSYCHIATRIC: No overt anxiety or sadness. Sleeps well.   Filed Vitals:   12/13/13 1019  BP: 133/88  Pulse: 56  Temp: 96.7 F (35.9 C)  Resp: 20    Physical Exam  GENERAL APPEARANCE: Alert, conversant. Appropriately groomed. No acute distress  SKIN: No diaphoresis rash, or wounds HEENT: Unremarkable RESPIRATORY: Breathing is even, unlabored. Lung sounds are clear   CARDIOVASCULAR: Heart RRR no murmurs, rubs or gallops. Slight LLE  peripheral edema  GASTROINTESTINAL: Abdomen is soft, non-tender, not distended w/ normal bowel sounds.  GENITOURINARY: Bladder non tender, not distended  MUSCULOSKELETAL: No abnormal joints or musculature NEUROLOGIC: Cranial nerves 2-12 grossly intact. Moves all extremities no tremor. PSYCHIATRIC: Mood and affect appropriate to situation, no behavioral issues  Patient Active Problem List   Diagnosis Date Noted  . Vitamin B 12 deficiency 12/18/2013  . Colon abnormality 12/18/2013  . Peripheral neuropathy 09/05/2013  . Hyperlipidemia   . Bimalleolar ankle fracture 09/01/2013  . History of DVT (deep vein thrombosis) 09/01/2013  . Seizure 09/01/2013  . Ankle fracture 09/01/2013  . Fracture of ankle, closed 09/01/2013  . Internal hemorrhoids 10/06/2012  . External hemorrhoids 10/06/2012  . Anal skin tag 10/06/2012    CBC    Component Value Date/Time   WBC 6.7 10/19/2013 1629   WBC 9.0 10/17/2013   RBC 4.39 10/19/2013 1629   HGB 14.2 10/19/2013 1629   HCT 41.5 10/19/2013 1629   PLT 180 10/19/2013 1629   MCV 94.5 10/19/2013 1629   LYMPHSABS 0.8 10/19/2013 1629   MONOABS 0.7 10/19/2013 1629   EOSABS  0.2 10/19/2013 1629   BASOSABS 0.0 10/19/2013 1629    CMP     Component Value Date/Time   NA 140 10/19/2013 1629   K 3.8 10/19/2013 1629   CL 101 10/19/2013 1629   CO2 23 10/19/2013 1629   GLUCOSE 137* 10/19/2013 1629   BUN 12 10/19/2013 1629   CREATININE 0.62 10/19/2013 1629   CALCIUM 8.1* 10/19/2013 1629   PROT 6.2 10/19/2013 1629   ALBUMIN 2.5* 10/19/2013 1629   AST 19 10/19/2013 1629   ALT 26 10/19/2013 1629  ALKPHOS 132* 10/19/2013 1629   BILITOT <0.2* 10/19/2013 1629   GFRNONAA >90 10/19/2013 1629   GFRAA >90 10/19/2013 1629    Assessment and Plan  Peripheral neuropathy On no meds; no c/o  Bimalleolar ankle fracture Pt is doing well; still some pain and pain meds have been changed to ultram  History of DVT (deep vein thrombosis) No longer on coumadin-now on xarelto  Hyperlipidemia Continue pravachol; 10/2013 - LDL 78, HDL 45  Seizure Pt on keppra and dilantin without problems  Vitamin B 12 deficiency B12 level is now normal-can d/c b12  Colon abnormality Colonoscopy 11/2013 pt had dilated colon without obstruction; pt started on amitiza 24 BID and pain med changed to Teressa Senter, MD

## 2013-12-16 ENCOUNTER — Other Ambulatory Visit: Payer: Self-pay | Admitting: *Deleted

## 2013-12-16 MED ORDER — TRAMADOL HCL 50 MG PO TABS: ORAL_TABLET | ORAL | Status: DC

## 2013-12-16 NOTE — Telephone Encounter (Signed)
[facility]

## 2013-12-18 ENCOUNTER — Encounter: Payer: Self-pay | Admitting: Internal Medicine

## 2013-12-18 DIAGNOSIS — E538 Deficiency of other specified B group vitamins: Secondary | ICD-10-CM | POA: Insufficient documentation

## 2013-12-18 DIAGNOSIS — K639 Disease of intestine, unspecified: Secondary | ICD-10-CM | POA: Insufficient documentation

## 2013-12-18 NOTE — Assessment & Plan Note (Signed)
B12 level is now normal-can d/c b12

## 2013-12-18 NOTE — Assessment & Plan Note (Signed)
Pt on keppra and dilantin without problems

## 2013-12-18 NOTE — Assessment & Plan Note (Addendum)
No longer on coumadin-now on xarelto

## 2013-12-18 NOTE — Assessment & Plan Note (Signed)
Colonoscopy 11/2013 pt had dilated colon without obstruction; pt started on amitiza 24 BID and pain med changed to ultram;

## 2013-12-18 NOTE — Assessment & Plan Note (Addendum)
Pt is doing well; still some pain and pain meds have been changed to ultram

## 2013-12-18 NOTE — Assessment & Plan Note (Signed)
On no meds; no c/o

## 2013-12-18 NOTE — Assessment & Plan Note (Signed)
Continue pravachol; 10/2013 - LDL 78, HDL 45

## 2013-12-19 ENCOUNTER — Non-Acute Institutional Stay (SKILLED_NURSING_FACILITY): Payer: Medicare Other | Admitting: Nurse Practitioner

## 2013-12-19 DIAGNOSIS — K639 Disease of intestine, unspecified: Secondary | ICD-10-CM

## 2013-12-19 DIAGNOSIS — IMO0001 Reserved for inherently not codable concepts without codable children: Secondary | ICD-10-CM

## 2013-12-19 DIAGNOSIS — S82842D Displaced bimalleolar fracture of left lower leg, subsequent encounter for closed fracture with routine healing: Secondary | ICD-10-CM

## 2013-12-19 NOTE — Progress Notes (Signed)
Patient ID: [name], male   DOB: 07-12-1950, 63 y.o.   MRN: [id number]    Nursing Home Location:  [place] of Service: SNF (12)  PCP: Merrilee Seashore, MD  Allergies  Allergen Reactions  . Penicillins     Mother and brother have had severe allergic reactions, no personal reaction    Chief Complaint  Patient presents with  . Acute Visit    HPI:  63 y.o. male history of seizures and recurrent DVT on Coumadin, hyperlipidemia at [facility] after bimalleolar ankle fracture who is being seen today due to worsening abdominal pain. Pt has been seen by GI who started him on resistor due to abd and constipation, first dose was today and he reports he had a BM but still with abd pain. No fevers or chills. No dysuria.    Review of Systems:  Review of Systems  Constitutional: Negative for fever, chills and malaise/fatigue.  Respiratory: Negative for cough and shortness of breath.   Cardiovascular: Negative for chest pain, palpitations and leg swelling.  Gastrointestinal: Positive for abdominal pain and constipation. Negative for heartburn, nausea, vomiting, diarrhea, blood in stool and melena.  Genitourinary: Negative for dysuria, urgency and frequency.  Musculoskeletal: Positive for myalgias.  Neurological: Negative for dizziness and weakness.  Psychiatric/Behavioral: Negative for depression. The patient is not nervous/anxious.     Past Medical History  Diagnosis Date  . Coronary artery disease   . Depression   . Peripheral vascular disease   . Seizures   . Deep vein thrombophlebitis of left leg   . Deep vein thrombosis of right lower extremity   . Psoriasis   . Arthritis   . DVT (deep venous thrombosis)     multiple times  . Weakness of both legs   . Peripheral neuropathy   . Venous stasis   . Hyperplasia of prostate   . BPH (benign prostatic hyperplasia)   . Hyperlipidemia    Past Surgical History  Procedure Laterality Date  .  Colonoscopy  03/26/2012    Procedure: COLONOSCOPY;  Surgeon: Beryle Beams, MD;  Location: [facility];  Service: Endoscopy;  Laterality: N/A;  . Tonsillectomy      as child  . Flexible sigmoidoscopy N/A 12/09/2013    Procedure: FLEXIBLE SIGMOIDOSCOPY;  Surgeon: Beryle Beams, MD;  Location: [facility];  Service: Endoscopy;  Laterality: N/A;   Social History:   reports that he quit smoking about 31 years ago. His smoking use included Pipe. He does not have any smokeless tobacco history on file. He reports that he does not drink alcohol or use illicit drugs.  Family History  Problem Relation Age of Onset  . Vision loss Father   . Hypertension Father   . Diabetes Father   . Heart disease Father   . Cancer Father     colon  . Diabetes Mother   . Hypertension Mother   . Heart disease Mother   . Dementia Mother   . Cancer Mother     breast ca  . Diabetes Sister   . Hyperlipidemia Sister   . Hypertension Sister   . Diabetes Brother   . Kidney disease Brother   . Heart disease Brother     Medications: Patient's Medications  New Prescriptions   No medications on file  Previous Medications   ACETAMINOPHEN (TYLENOL) 500 MG TABLET    Take 500 mg by mouth 2 (two) times daily as needed for moderate pain.    CALCIUM  CARB-CHOLECALCIFEROL (CALCIUM-VITAMIN D) 600-400 MG-UNIT TABS    Take 1 tablet by mouth daily. For calcium supplement   DOCUSATE SODIUM (COLACE) 100 MG CAPSULE    Take 1 capsule (100 mg total) by mouth 2 (two) times daily.   FLUOXETINE (PROZAC) 20 MG CAPSULE    Take 20 mg by mouth every morning. For depression   HYOSCYAMINE (LEVSIN, ANASPAZ) 0.125 MG TABLET    Take 0.125 mg by mouth every 4 (four) hours as needed for bladder spasms.   LEVETIRACETAM (KEPPRA) 750 MG TABLET    Take 1,500 mg by mouth 3 (three) times daily. For seizures   LUBIPROSTONE (AMITIZA) 24 MCG CAPSULE    Take 1 capsule (24 mcg total) by mouth 2 (two) times daily with a meal.   MULTIPLE VITAMIN  (MULTIVITAMIN WITH MINERALS) TABS    Take 1 tablet by mouth daily.   PHENYTOIN (DILANTIN) 100 MG ER CAPSULE    Take 100 mg by mouth See admin instructions. Give 1 tablet by mouth four times daily on Tuesday, Thursday, Saturday, and Sunday. Give 1 tablet by mouth three times daily on Monday, Wednesday, and Friday.   POLYETHYLENE GLYCOL (MIRALAX / GLYCOLAX) PACKET    Take 17 g by mouth daily. For constipation   PRAVASTATIN (PRAVACHOL) 20 MG TABLET    Take 20 mg by mouth daily. For HLD   RIVAROXABAN (XARELTO) 20 MG TABS TABLET    Take 20 mg by mouth daily with supper.   TAMSULOSIN (FLOMAX) 0.4 MG CAPS CAPSULE    Take 0.4 mg by mouth daily. For BPH   TRAMADOL (ULTRAM) 50 MG TABLET    Take one tablet by mouth every 6 hours as needed for pain   VITAMIN B-12 (CYANOCOBALAMIN) 1000 MCG TABLET    Take 1,000 mcg by mouth daily. For supplement  Modified Medications   No medications on file  Discontinued Medications   No medications on file     Physical Exam:  Filed Vitals:   12/19/13 1423  BP: 131/53  Pulse: 60  Temp: 97.6 F (36.4 C)  Resp: 20    Physical Exam  Constitutional: He is oriented to person, place, and time and well-developed, well-nourished, and in no distress.  Cardiovascular: Normal rate, regular rhythm and normal heart sounds.   Pulmonary/Chest: Effort normal and breath sounds normal. No respiratory distress.  Abdominal: Soft. Bowel sounds are normal. He exhibits distension.  Obese abdomen, non-tender at this time  Musculoskeletal: He exhibits no edema and no tenderness.  Neurological: He is alert and oriented to person, place, and time.  Skin: Skin is warm and dry.  Psychiatric: Affect normal.      Assessment/Plan 1. Bimalleolar ankle fracture, left, closed, with routine healing, subsequent encounter -reports pain is well controlled  2. Colon abnormality -ongoing abdominal complaints, with worsening distension -will get KUB at this time.  -cbc and cmp

## 2013-12-25 ENCOUNTER — Inpatient Hospital Stay (HOSPITAL_COMMUNITY)
Admission: EM | Admit: 2013-12-25 | Discharge: 2014-01-03 | DRG: 329 | Disposition: A | Discharge status: Skilled Nursing Facility | Payer: Medicare Other | Attending: General Surgery | Admitting: General Surgery

## 2013-12-25 ENCOUNTER — Encounter (HOSPITAL_COMMUNITY): Admission: EM | Disposition: A | Discharge status: Skilled Nursing Facility | Payer: Self-pay | Source: Home / Self Care

## 2013-12-25 ENCOUNTER — Encounter (HOSPITAL_COMMUNITY): Payer: Self-pay | Admitting: Emergency Medicine

## 2013-12-25 ENCOUNTER — Encounter (HOSPITAL_COMMUNITY): Payer: Medicare Other | Admitting: Anesthesiology

## 2013-12-25 ENCOUNTER — Emergency Department (HOSPITAL_COMMUNITY): Payer: Medicare Other | Admitting: Anesthesiology

## 2013-12-25 ENCOUNTER — Emergency Department (HOSPITAL_COMMUNITY): Payer: Medicare Other

## 2013-12-25 DIAGNOSIS — K560 Paralytic ileus: Secondary | ICD-10-CM | POA: Diagnosis present

## 2013-12-25 DIAGNOSIS — K659 Peritonitis, unspecified: Secondary | ICD-10-CM | POA: Diagnosis present

## 2013-12-25 DIAGNOSIS — R5381 Other malaise: Secondary | ICD-10-CM | POA: Diagnosis present

## 2013-12-25 DIAGNOSIS — F329 Major depressive disorder, single episode, unspecified: Secondary | ICD-10-CM | POA: Diagnosis present

## 2013-12-25 DIAGNOSIS — K668 Other specified disorders of peritoneum: Secondary | ICD-10-CM

## 2013-12-25 DIAGNOSIS — K631 Perforation of intestine (nontraumatic): Secondary | ICD-10-CM

## 2013-12-25 DIAGNOSIS — K559 Vascular disorder of intestine, unspecified: Secondary | ICD-10-CM | POA: Diagnosis present

## 2013-12-25 DIAGNOSIS — E785 Hyperlipidemia, unspecified: Secondary | ICD-10-CM | POA: Diagnosis present

## 2013-12-25 DIAGNOSIS — Z79899 Other long term (current) drug therapy: Secondary | ICD-10-CM

## 2013-12-25 DIAGNOSIS — E876 Hypokalemia: Secondary | ICD-10-CM | POA: Diagnosis present

## 2013-12-25 DIAGNOSIS — Z6834 Body mass index (BMI) 34.0-34.9, adult: Secondary | ICD-10-CM | POA: Diagnosis not present

## 2013-12-25 DIAGNOSIS — Z6835 Body mass index (BMI) 35.0-35.9, adult: Secondary | ICD-10-CM

## 2013-12-25 DIAGNOSIS — R569 Unspecified convulsions: Secondary | ICD-10-CM

## 2013-12-25 DIAGNOSIS — G609 Hereditary and idiopathic neuropathy, unspecified: Secondary | ICD-10-CM | POA: Diagnosis present

## 2013-12-25 DIAGNOSIS — Z86718 Personal history of other venous thrombosis and embolism: Secondary | ICD-10-CM | POA: Diagnosis not present

## 2013-12-25 DIAGNOSIS — I739 Peripheral vascular disease, unspecified: Secondary | ICD-10-CM | POA: Diagnosis present

## 2013-12-25 DIAGNOSIS — Z87891 Personal history of nicotine dependence: Secondary | ICD-10-CM | POA: Diagnosis not present

## 2013-12-25 DIAGNOSIS — F3289 Other specified depressive episodes: Secondary | ICD-10-CM | POA: Diagnosis present

## 2013-12-25 DIAGNOSIS — Z7901 Long term (current) use of anticoagulants: Secondary | ICD-10-CM

## 2013-12-25 DIAGNOSIS — I2510 Atherosclerotic heart disease of native coronary artery without angina pectoris: Secondary | ICD-10-CM | POA: Diagnosis present

## 2013-12-25 DIAGNOSIS — K639 Disease of intestine, unspecified: Secondary | ICD-10-CM

## 2013-12-25 DIAGNOSIS — K55059 Acute (reversible) ischemia of intestine, part and extent unspecified: Secondary | ICD-10-CM

## 2013-12-25 DIAGNOSIS — G40909 Epilepsy, unspecified, not intractable, without status epilepticus: Secondary | ICD-10-CM | POA: Diagnosis present

## 2013-12-25 DIAGNOSIS — I872 Venous insufficiency (chronic) (peripheral): Secondary | ICD-10-CM | POA: Diagnosis present

## 2013-12-25 DIAGNOSIS — R198 Other specified symptoms and signs involving the digestive system and abdomen: Secondary | ICD-10-CM

## 2013-12-25 DIAGNOSIS — N400 Enlarged prostate without lower urinary tract symptoms: Secondary | ICD-10-CM | POA: Diagnosis present

## 2013-12-25 HISTORY — PX: COLOSTOMY: SHX63

## 2013-12-25 HISTORY — PX: LAPAROTOMY: SHX154

## 2013-12-25 LAB — BLOOD GAS, ARTERIAL
Acid-Base Excess: 2.6 mmol/L — ABNORMAL HIGH (ref 0.0–2.0)
Bicarbonate: 26.7 mEq/L — ABNORMAL HIGH (ref 20.0–24.0)
Drawn by: 252031
FIO2: 1 %
MECHVT: 650 mL
O2 Saturation: 99.9 %
PATIENT TEMPERATURE: 98.6
PCO2 ART: 41.1 mmHg (ref 35.0–45.0)
PEEP/CPAP: 5 cmH2O
PH ART: 7.428 (ref 7.350–7.450)
PO2 ART: 343 mmHg — AB (ref 80.0–100.0)
RATE: 14 resp/min
TCO2: 27.9 mmol/L (ref 0–100)

## 2013-12-25 LAB — COMPREHENSIVE METABOLIC PANEL
ALT: 20 U/L (ref 0–53)
AST: 17 U/L (ref 0–37)
Albumin: 2.8 g/dL — ABNORMAL LOW (ref 3.5–5.2)
Alkaline Phosphatase: 148 U/L — ABNORMAL HIGH (ref 39–117)
Anion gap: 13 (ref 5–15)
BUN: 9 mg/dL (ref 6–23)
CALCIUM: 8.8 mg/dL (ref 8.4–10.5)
CHLORIDE: 95 meq/L — AB (ref 96–112)
CO2: 28 mEq/L (ref 19–32)
CREATININE: 0.73 mg/dL (ref 0.50–1.35)
GFR calc Af Amer: >90 mL/min (ref >90)
GFR calc non Af Amer: >90 mL/min (ref >90)
Glucose, Bld: 114 mg/dL — ABNORMAL HIGH (ref 70–99)
Potassium: 3.1 mEq/L — ABNORMAL LOW (ref 3.7–5.3)
SODIUM: 136 meq/L — AB (ref 137–147)
Total Bilirubin: 0.5 mg/dL (ref 0.3–1.2)
Total Protein: 6.7 g/dL (ref 6.0–8.3)

## 2013-12-25 LAB — I-STAT CG4 LACTIC ACID, ED: LACTIC ACID, VENOUS: 1.63 mmol/L (ref 0.5–2.2)

## 2013-12-25 LAB — BASIC METABOLIC PANEL
Anion gap: 11 (ref 5–15)
BUN: 9 mg/dL (ref 6–23)
CO2: 25 mEq/L (ref 19–32)
Calcium: 7.5 mg/dL — ABNORMAL LOW (ref 8.4–10.5)
Chloride: 100 mEq/L (ref 96–112)
Creatinine, Ser: 0.55 mg/dL (ref 0.50–1.35)
Glucose, Bld: 124 mg/dL — ABNORMAL HIGH (ref 70–99)
Potassium: 3.2 mEq/L — ABNORMAL LOW (ref 3.7–5.3)
Sodium: 136 mEq/L — ABNORMAL LOW (ref 137–147)

## 2013-12-25 LAB — CBC WITH DIFFERENTIAL/PLATELET
BASOS ABS: 0 10*3/uL (ref 0.0–0.1)
Basophils Relative: 0 % (ref 0–1)
EOS PCT: 0 % (ref 0–5)
Eosinophils Absolute: 0 10*3/uL (ref 0.0–0.7)
HCT: 47.9 % (ref 39.0–52.0)
Hemoglobin: 16.5 g/dL (ref 13.0–17.0)
LYMPHS PCT: 3 % — AB (ref 12–46)
Lymphs Abs: 0.6 10*3/uL — ABNORMAL LOW (ref 0.7–4.0)
MCH: 32.2 pg (ref 26.0–34.0)
MCHC: 34.4 g/dL (ref 30.0–36.0)
MCV: 93.4 fL (ref 78.0–100.0)
Monocytes Absolute: 1.5 10*3/uL — ABNORMAL HIGH (ref 0.1–1.0)
Monocytes Relative: 9 % (ref 3–12)
NEUTROS ABS: 15.1 10*3/uL — AB (ref 1.7–7.7)
Neutrophils Relative %: 88 % — ABNORMAL HIGH (ref 43–77)
PLATELETS: 201 10*3/uL (ref 150–400)
RBC: 5.13 MIL/uL (ref 4.22–5.81)
RDW: 12.8 % (ref 11.5–15.5)
WBC: 17.1 10*3/uL — ABNORMAL HIGH (ref 4.0–10.5)

## 2013-12-25 LAB — POCT I-STAT 3, ART BLOOD GAS (G3+)
ACID-BASE EXCESS: 3 mmol/L — AB (ref 0.0–2.0)
Bicarbonate: 28 mEq/L — ABNORMAL HIGH (ref 20.0–24.0)
O2 SAT: 98 %
Patient temperature: 98.4
TCO2: 29 mmol/L (ref 0–100)
pCO2 arterial: 41.4 mmHg (ref 35.0–45.0)
pH, Arterial: 7.438 (ref 7.350–7.450)
pO2, Arterial: 109 mmHg — ABNORMAL HIGH (ref 80.0–100.0)

## 2013-12-25 LAB — PREPARE RBC (CROSSMATCH)

## 2013-12-25 LAB — CBC
HCT: 36.3 % — ABNORMAL LOW (ref 39.0–52.0)
HEMOGLOBIN: 12.3 g/dL — AB (ref 13.0–17.0)
MCH: 31.4 pg (ref 26.0–34.0)
MCHC: 33.9 g/dL (ref 30.0–36.0)
MCV: 92.6 fL (ref 78.0–100.0)
PLATELETS: 187 10*3/uL (ref 150–400)
RBC: 3.92 MIL/uL — ABNORMAL LOW (ref 4.22–5.81)
RDW: 12.7 % (ref 11.5–15.5)
WBC: 12.1 10*3/uL — ABNORMAL HIGH (ref 4.0–10.5)

## 2013-12-25 LAB — GLUCOSE, CAPILLARY: GLUCOSE-CAPILLARY: 117 mg/dL — AB (ref 70–99)

## 2013-12-25 LAB — PROTIME-INR
INR: 1.87 — AB (ref 0.00–1.49)
Prothrombin Time: 21.5 seconds — ABNORMAL HIGH (ref 11.6–15.2)

## 2013-12-25 LAB — ABO/RH: ABO/RH(D): A POS

## 2013-12-25 LAB — LIPASE, BLOOD: LIPASE: 15 U/L (ref 11–59)

## 2013-12-25 LAB — PHENYTOIN LEVEL, TOTAL: Phenytoin Lvl: 5.6 ug/mL — ABNORMAL LOW (ref 10.0–20.0)

## 2013-12-25 LAB — APTT: aPTT: 44 seconds — ABNORMAL HIGH (ref 24–37)

## 2013-12-25 SURGERY — LAPAROTOMY, EXPLORATORY
Anesthesia: General | Site: Abdomen

## 2013-12-25 MED ORDER — FENTANYL CITRATE 0.05 MG/ML IJ SOLN
INTRAMUSCULAR | Status: AC
  Filled 2013-12-25: qty 5

## 2013-12-25 MED ORDER — ONDANSETRON HCL 4 MG/2ML IJ SOLN
4.0000 mg | Freq: Four times a day (QID) | INTRAMUSCULAR | Status: DC | PRN
Start: 2013-12-25 — End: 2014-01-03

## 2013-12-25 MED ORDER — LEVETIRACETAM 750 MG PO TABS
1500.0000 mg | ORAL_TABLET | Freq: Three times a day (TID) | ORAL | Status: DC
  Filled 2013-12-25: qty 2

## 2013-12-25 MED ORDER — ONDANSETRON HCL 4 MG/2ML IJ SOLN
INTRAMUSCULAR | Status: DC | PRN
  Administered 2013-12-25: 4 mg via INTRAVENOUS

## 2013-12-25 MED ORDER — SUCCINYLCHOLINE CHLORIDE 20 MG/ML IJ SOLN
INTRAMUSCULAR | Status: DC | PRN
  Administered 2013-12-25: 120 mg via INTRAVENOUS

## 2013-12-25 MED ORDER — ARTIFICIAL TEARS OP OINT
TOPICAL_OINTMENT | OPHTHALMIC | Status: DC | PRN
  Administered 2013-12-25: 1 via OPHTHALMIC

## 2013-12-25 MED ORDER — PIPERACILLIN-TAZOBACTAM 3.375 G IVPB
3.3750 g | Freq: Three times a day (TID) | INTRAVENOUS | Status: DC
  Administered 2013-12-25 – 2014-01-02 (×24): 3.375 g via INTRAVENOUS
  Filled 2013-12-25 (×28): qty 50

## 2013-12-25 MED ORDER — PROPOFOL 10 MG/ML IV EMUL
5.0000 ug/kg/min | INTRAVENOUS | Status: DC
  Administered 2013-12-25: 44.799 ug/kg/min via INTRAVENOUS
  Administered 2013-12-25: 50 ug/kg/min via INTRAVENOUS
  Administered 2013-12-26: 34.801 ug/kg/min via INTRAVENOUS
  Filled 2013-12-25 (×3): qty 100

## 2013-12-25 MED ORDER — POTASSIUM CHLORIDE 10 MEQ/100ML IV SOLN
10.0000 meq | INTRAVENOUS | Status: DC
Start: 2013-12-25 — End: 2013-12-25

## 2013-12-25 MED ORDER — POTASSIUM CHLORIDE IN NACL 20-0.9 MEQ/L-% IV SOLN
INTRAVENOUS | Status: DC
  Filled 2013-12-25 (×4): qty 1000

## 2013-12-25 MED ORDER — DEXTROSE-NACL 5-0.9 % IV SOLN
INTRAVENOUS | Status: DC
  Administered 2013-12-25: 20:00:00 via INTRAVENOUS
  Administered 2013-12-26: 100 mL/h via INTRAVENOUS
  Administered 2013-12-26 – 2013-12-27 (×2): via INTRAVENOUS

## 2013-12-25 MED ORDER — LUBIPROSTONE 24 MCG PO CAPS
24.0000 ug | ORAL_CAPSULE | Freq: Two times a day (BID) | ORAL | Status: DC
  Filled 2013-12-25 (×3): qty 1

## 2013-12-25 MED ORDER — PROPOFOL 10 MG/ML IV EMUL
INTRAVENOUS | Status: AC
  Filled 2013-12-25: qty 100

## 2013-12-25 MED ORDER — ETOMIDATE 2 MG/ML IV SOLN
INTRAVENOUS | Status: AC
  Filled 2013-12-25: qty 10

## 2013-12-25 MED ORDER — POTASSIUM CHLORIDE 10 MEQ/100ML IV SOLN
INTRAVENOUS | Status: DC | PRN
  Administered 2013-12-25 (×3): 10 meq via INTRAVENOUS

## 2013-12-25 MED ORDER — MORPHINE SULFATE 2 MG/ML IJ SOLN
1.0000 mg | INTRAMUSCULAR | Status: DC | PRN
  Administered 2013-12-25 – 2013-12-26 (×3): 2 mg via INTRAVENOUS
  Administered 2013-12-26 (×3): 4 mg via INTRAVENOUS
  Administered 2013-12-26 (×4): 2 mg via INTRAVENOUS
  Administered 2013-12-26: 4 mg via INTRAVENOUS
  Administered 2013-12-26: 2 mg via INTRAVENOUS
  Administered 2013-12-26 – 2013-12-27 (×3): 4 mg via INTRAVENOUS
  Administered 2013-12-27: 2 mg via INTRAVENOUS
  Administered 2013-12-27: 4 mg via INTRAVENOUS
  Filled 2013-12-25: qty 2
  Filled 2013-12-25: qty 1
  Filled 2013-12-25 (×2): qty 2
  Filled 2013-12-25: qty 1
  Filled 2013-12-25 (×2): qty 2
  Filled 2013-12-25: qty 1
  Filled 2013-12-25: qty 2
  Filled 2013-12-25 (×3): qty 1
  Filled 2013-12-25: qty 2
  Filled 2013-12-25: qty 1
  Filled 2013-12-25: qty 2
  Filled 2013-12-25: qty 1
  Filled 2013-12-25: qty 2

## 2013-12-25 MED ORDER — MIDAZOLAM HCL 2 MG/2ML IJ SOLN
INTRAMUSCULAR | Status: AC
  Filled 2013-12-25: qty 2

## 2013-12-25 MED ORDER — PHENYTOIN 125 MG/5ML PO SUSP
100.0000 mg | ORAL | Status: DC
  Filled 2013-12-25 (×2): qty 4

## 2013-12-25 MED ORDER — FENTANYL CITRATE 0.05 MG/ML IJ SOLN
INTRAMUSCULAR | Status: DC | PRN
  Administered 2013-12-25: 150 ug via INTRAVENOUS
  Administered 2013-12-25: 250 ug via INTRAVENOUS
  Administered 2013-12-25 (×2): 100 ug via INTRAVENOUS
  Administered 2013-12-25 (×2): 250 ug via INTRAVENOUS
  Administered 2013-12-25: 150 ug via INTRAVENOUS

## 2013-12-25 MED ORDER — LACTATED RINGERS IV SOLN
INTRAVENOUS | Status: DC | PRN
  Administered 2013-12-25: 16:00:00 via INTRAVENOUS

## 2013-12-25 MED ORDER — PROPOFOL 10 MG/ML IV BOLUS
INTRAVENOUS | Status: DC | PRN
  Administered 2013-12-25: 100 mg via INTRAVENOUS
  Administered 2013-12-25: 120 mg via INTRAVENOUS

## 2013-12-25 MED ORDER — PHENYTOIN 125 MG/5ML PO SUSP
100.0000 mg | ORAL | Status: DC
  Filled 2013-12-25 (×3): qty 4

## 2013-12-25 MED ORDER — SODIUM CHLORIDE 0.9 % IV SOLN
INTRAVENOUS | Status: DC | PRN
  Administered 2013-12-25 (×2): via INTRAVENOUS

## 2013-12-25 MED ORDER — PHENYTOIN SODIUM EXTENDED 100 MG PO CAPS
100.0000 mg | ORAL_CAPSULE | ORAL | Status: DC
  Filled 2013-12-25: qty 1

## 2013-12-25 MED ORDER — ONDANSETRON HCL 4 MG/2ML IJ SOLN: 4.0000 mg | Freq: Four times a day (QID) | INTRAMUSCULAR | Status: DC | PRN

## 2013-12-25 MED ORDER — VANCOMYCIN HCL 10 G IV SOLR
1250.0000 mg | Freq: Once | INTRAVENOUS | Status: DC
  Filled 2013-12-25: qty 1250

## 2013-12-25 MED ORDER — POTASSIUM CHLORIDE 10 MEQ/100ML IV SOLN
10.0000 meq | INTRAVENOUS | Status: DC
  Filled 2013-12-25 (×4): qty 100

## 2013-12-25 MED ORDER — VECURONIUM BROMIDE 10 MG IV SOLR
INTRAVENOUS | Status: DC | PRN
  Administered 2013-12-25: 5 mg via INTRAVENOUS
  Administered 2013-12-25: 10 mg via INTRAVENOUS
  Administered 2013-12-25: 5 mg via INTRAVENOUS

## 2013-12-25 MED ORDER — 0.9 % SODIUM CHLORIDE (POUR BTL) OPTIME
TOPICAL | Status: DC | PRN
  Administered 2013-12-25: 3000 mL

## 2013-12-25 MED ORDER — LEVETIRACETAM IN NACL 1500 MG/100ML IV SOLN
1500.0000 mg | Freq: Three times a day (TID) | INTRAVENOUS | Status: DC
  Administered 2013-12-25 – 2014-01-02 (×23): 1500 mg via INTRAVENOUS
  Filled 2013-12-25 (×27): qty 100

## 2013-12-25 MED ORDER — LEVETIRACETAM 100 MG/ML PO SOLN
1500.0000 mg | Freq: Three times a day (TID) | ORAL | Status: DC
  Filled 2013-12-25 (×2): qty 15

## 2013-12-25 MED ORDER — POTASSIUM CHLORIDE 10 MEQ/100ML IV SOLN
10.0000 meq | INTRAVENOUS | Status: AC
  Administered 2013-12-25 – 2013-12-26 (×3): 10 meq via INTRAVENOUS
  Filled 2013-12-25: qty 100

## 2013-12-25 MED ORDER — PROPOFOL INFUSION 10 MG/ML OPTIME
INTRAVENOUS | Status: DC | PRN
  Administered 2013-12-25: 100 ug/kg/min via INTRAVENOUS

## 2013-12-25 MED ORDER — PROPOFOL INFUSION 10 MG/ML OPTIME: 5.0000 ug/kg/min | INTRAVENOUS | Status: DC

## 2013-12-25 MED ORDER — PANTOPRAZOLE SODIUM 40 MG IV SOLR
40.0000 mg | Freq: Every day | INTRAVENOUS | Status: DC
  Administered 2013-12-25 – 2014-01-02 (×9): 40 mg via INTRAVENOUS
  Filled 2013-12-25 (×12): qty 40

## 2013-12-25 MED ORDER — PIPERACILLIN-TAZOBACTAM 3.375 G IVPB 30 MIN
3.3750 g | Freq: Once | INTRAVENOUS | Status: AC
  Administered 2013-12-25: 3.375 g via INTRAVENOUS
  Filled 2013-12-25: qty 50

## 2013-12-25 MED ORDER — PROPOFOL INFUSION 10 MG/ML OPTIME
5.0000 ug/kg/min | INTRAVENOUS | Status: AC
  Administered 2013-12-25: 50 ug/kg/min via INTRAVENOUS
  Filled 2013-12-25: qty 20

## 2013-12-25 SURGICAL SUPPLY — 55 items
BLADE SURG ROTATE 9660 (MISCELLANEOUS) ×2 IMPLANT
BNDG GAUZE ELAST 4 BULKY (GAUZE/BANDAGES/DRESSINGS) ×2 IMPLANT
CANISTER SUCTION 2500CC (MISCELLANEOUS) ×3 IMPLANT
CHLORAPREP W/TINT 26ML (MISCELLANEOUS) ×3 IMPLANT
COVER MAYO STAND STRL (DRAPES) ×2 IMPLANT
COVER SURGICAL LIGHT HANDLE (MISCELLANEOUS) ×3 IMPLANT
DRAPE LAPAROSCOPIC ABDOMINAL (DRAPES) ×3 IMPLANT
DRAPE PROXIMA HALF (DRAPES) IMPLANT
DRAPE UTILITY 15X26 W/TAPE STR (DRAPE) ×6 IMPLANT
DRAPE WARM FLUID 44X44 (DRAPE) ×3 IMPLANT
DRSG OPSITE POSTOP 4X10 (GAUZE/BANDAGES/DRESSINGS) IMPLANT
DRSG OPSITE POSTOP 4X8 (GAUZE/BANDAGES/DRESSINGS) IMPLANT
DRSG PAD ABDOMINAL 8X10 ST (GAUZE/BANDAGES/DRESSINGS) ×2 IMPLANT
ELECT BLADE 4.0 EZ CLEAN MEGAD (MISCELLANEOUS) ×3
ELECT BLADE 6.5 EXT (BLADE) IMPLANT
ELECT CAUTERY BLADE 6.4 (BLADE) ×6 IMPLANT
ELECT REM PT RETURN 9FT ADLT (ELECTROSURGICAL) ×3
ELECTRODE BLDE 4.0 EZ CLN MEGD (MISCELLANEOUS) IMPLANT
ELECTRODE REM PT RTRN 9FT ADLT (ELECTROSURGICAL) ×1 IMPLANT
GLOVE BIO SURGEON STRL SZ7.5 (GLOVE) ×3 IMPLANT
GLOVE BIOGEL PI IND STRL 8 (GLOVE) ×1 IMPLANT
GLOVE BIOGEL PI INDICATOR 8 (GLOVE) ×2
GOWN STRL REUS W/ TWL LRG LVL3 (GOWN DISPOSABLE) ×2 IMPLANT
GOWN STRL REUS W/ TWL XL LVL3 (GOWN DISPOSABLE) ×1 IMPLANT
GOWN STRL REUS W/TWL LRG LVL3 (GOWN DISPOSABLE) ×6
GOWN STRL REUS W/TWL XL LVL3 (GOWN DISPOSABLE) ×3
KIT BASIN OR (CUSTOM PROCEDURE TRAY) ×3 IMPLANT
KIT OSTOMY DRAINABLE 2.75 STR (WOUND CARE) ×4 IMPLANT
KIT ROOM TURNOVER OR (KITS) ×3 IMPLANT
LIGASURE IMPACT 36 18CM CVD LR (INSTRUMENTS) IMPLANT
NS IRRIG 1000ML POUR BTL (IV SOLUTION) ×6 IMPLANT
PACK GENERAL/GYN (CUSTOM PROCEDURE TRAY) ×3 IMPLANT
PAD ARMBOARD 7.5X6 YLW CONV (MISCELLANEOUS) ×6 IMPLANT
PENCIL BUTTON HOLSTER BLD 10FT (ELECTRODE) IMPLANT
RELOAD PROXIMATE 75MM BLUE (ENDOMECHANICALS) ×6 IMPLANT
RELOAD STAPLE 75 3.8 BLU REG (ENDOMECHANICALS) IMPLANT
SPECIMEN JAR LARGE (MISCELLANEOUS) ×2 IMPLANT
SPONGE GAUZE 4X4 12PLY STER LF (GAUZE/BANDAGES/DRESSINGS) ×2 IMPLANT
SPONGE LAP 18X18 X RAY DECT (DISPOSABLE) IMPLANT
STAPLER PROXIMATE 75MM BLUE (STAPLE) ×2 IMPLANT
STAPLER VISISTAT 35W (STAPLE) ×3 IMPLANT
SUCTION POOLE TIP (SUCTIONS) ×3 IMPLANT
SUT PDS AB 1 TP1 96 (SUTURE) ×6 IMPLANT
SUT SILK 2 0 SH CR/8 (SUTURE) ×3 IMPLANT
SUT SILK 2 0 TIES 10X30 (SUTURE) ×3 IMPLANT
SUT SILK 3 0 SH CR/8 (SUTURE) ×3 IMPLANT
SUT SILK 3 0 TIES 10X30 (SUTURE) ×3 IMPLANT
SUT VIC AB 3-0 SH 27 (SUTURE) ×12
SUT VIC AB 3-0 SH 27X BRD (SUTURE) IMPLANT
TAPE CLOTH SURG 6X10 WHT LF (GAUZE/BANDAGES/DRESSINGS) ×2 IMPLANT
TOWEL OR 17X26 10 PK STRL BLUE (TOWEL DISPOSABLE) ×5 IMPLANT
TRAY FOLEY CATH 16FRSI W/METER (SET/KITS/TRAYS/PACK) ×2 IMPLANT
TUBE CONNECTING 12'X1/4 (SUCTIONS)
TUBE CONNECTING 12X1/4 (SUCTIONS) IMPLANT
YANKAUER SUCT BULB TIP NO VENT (SUCTIONS) IMPLANT

## 2013-12-25 NOTE — Anesthesia Postprocedure Evaluation (Signed)
  Anesthesia Post-op Note  Patient: [name]  Procedure(s) Performed: Procedure(s): EXPLORATORY LAPAROTOMY/Transverse and right coloectomy/ end  ileostomy and mucus  fistula (N/A)  Patient Location: ICU  Anesthesia Type:General  Level of Consciousness: sedated and unresponsive  Airway and Oxygen Therapy: Patient remains intubated and on ventilator  Post-op Pain: none  Post-op Assessment: Post-op Vital signs reviewed, Patient's Cardiovascular Status Stable and Respiratory Function Stable  Post-op Vital Signs: Reviewed  Filed Vitals:   12/25/13 1426  BP: 148/73  Pulse: 108  Temp: 37.1 C  Resp:     Complications: No apparent anesthesia complications

## 2013-12-25 NOTE — Consult Note (Signed)
I have seen and examined the pt and agree with PA-Jenning's note. 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 Will take emergently to OR for ex lap I d/w the pt and his sister the risks and benefits of the procedure to include but not limited IW:[id number], bleeding, damage to structures, possible bowel resection possible ostomy, possible need for further surgery, and any other necessary procedures. The patient voiced understanding and wishes to proceed.

## 2013-12-25 NOTE — ED Notes (Signed)
Pt from [facility] where he is being tx for ankle fracture. Pt reports abdominal distension progressively worsening x 3 weeks. Per [facility] [place], he has been xrayd twice and told that it was gas and sluggish colon. Pt reports 10/10 pain, significant abdominal distension noted. Pt reports regular bowel movements, but had episode of diarrhea yesterday. Denies N/V. AO x4.

## 2013-12-25 NOTE — Transfer of Care (Signed)
Immediate Anesthesia Transfer of Care Note  Patient: [name]  Procedure(s) Performed: Procedure(s): EXPLORATORY LAPAROTOMY/Transverse and right coloectomy/ end  ileostomy and mucus  fistula (N/A)  Patient Location: SICU  Anesthesia Type:General  Level of Consciousness: sedated and Patient remains intubated per anesthesia plan  Airway & Oxygen Therapy: Patient remains intubated per anesthesia plan and Patient placed on Ventilator (see vital sign flow sheet for setting)  Post-op Assessment: Report given to PACU RN and Post -op Vital signs reviewed and stable  Post vital signs: Reviewed and stable  Complications: No apparent anesthesia complications

## 2013-12-25 NOTE — Consult Note (Signed)
PULMONARY  / CRITICAL CARE MEDICINE CONSULTATION   Name: TEODOR PRATER MRN: [id number] DOB: 09-01-1950    ADMISSION DATE:  12/25/2013 CONSULTATION DATE: December 25, 2013  REQUESTING CLINICIAN: Md Ccs, MD PRIMARY SERVICE: GI Surgery  CHIEF COMPLAINT:  Intubated  BRIEF PATIENT DESCRIPTION: 63 y/o man with seizure d/o and perforated viscus s/p ex lap w/ ileostomy.  SIGNIFICANT EVENTS / STUDIES:  Ex lap w/ Ileostomy 8/16  LINES / TUBES: ETT 8 mm -- 8/16 A-line, L wrist -- 8/16 R IJ CVC -- 8/16 Foley -- 8/16  CULTURES: Blood x2 -- 8/16  ANTIBIOTICS: Zosyn, 8/16 --  HISTORY OF PRESENT ILLNESS:   Patient intubated and no family available, history obtained from EMR Mr. 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 Symptoms persisted and then worsened, on presentation to ED today imaging was consistent with perforated bowel. Of note, he had just been switched from coumadin to xarelto for treatment of recurrent DVTs. He underwent ex-lap which showed perforation of transverse colon. Patient underwent partial colectomy with ileostomy. The patient was kept on a ventilator given his co-morbid conditions, and CCM was consulted to assist in ventilator management.  PAST MEDICAL HISTORY :  Past Medical History  Diagnosis Date  . Coronary artery disease   . Depression   . Peripheral vascular disease   . Seizures   . Deep vein thrombophlebitis of left leg   . Deep vein thrombosis of right lower extremity   . Psoriasis   . Arthritis   . DVT (deep venous thrombosis)     multiple times  . Weakness of both legs   . Peripheral neuropathy   . Venous stasis   . Hyperplasia of prostate   . BPH (benign prostatic hyperplasia)   .  Hyperlipidemia    Past Surgical History  Procedure Laterality Date  . Colonoscopy  03/26/2012    Procedure: COLONOSCOPY;  Surgeon: Beryle Beams, MD;  Location: [facility];  Service: Endoscopy;  Laterality: N/A;  . Tonsillectomy      as child  . Flexible sigmoidoscopy N/A 12/09/2013    Procedure: FLEXIBLE SIGMOIDOSCOPY;  Surgeon: Beryle Beams, MD;  Location: [facility];  Service: Endoscopy;  Laterality: N/A;   Prior to Admission medications   Medication Sig Start Date End Date Taking? Authorizing Provider  acetaminophen (TYLENOL) 500 MG tablet Take 500 mg by mouth 2 (two) times daily as needed for moderate pain.    Yes Historical Provider, MD  alum & mag hydroxide-simeth (MAALOX/MYLANTA) 200-200-20 MG/5ML suspension Take 30 mLs by mouth every 2 (two) hours as needed for indigestion or heartburn.    Yes Historical Provider, MD  Calcium Carb-Cholecalciferol (CALCIUM-VITAMIN D) 600-400 MG-UNIT TABS Take 1 tablet by mouth daily. For calcium supplement   Yes Historical Provider, MD  docusate sodium (COLACE) 100 MG capsule Take 1 capsule (100 mg total) by mouth 2 (two) times daily. 10/19/13  Yes Carmin Muskrat, MD  FLUoxetine (PROZAC) 20 MG capsule Take 20 mg by mouth every morning. For depression   Yes Historical Provider, MD  gabapentin (NEURONTIN) 600 MG tablet Take 600 mg by mouth 3 (three) times daily.   Yes Historical Provider, MD  HYDROcodone-acetaminophen (NORCO/VICODIN) 5-325 MG per tablet Take 1 tablet by mouth  every 4 (four) hours as needed for moderate pain.   Yes Historical Provider, MD  levETIRAcetam (KEPPRA) 750 MG tablet Take 1,500 mg by mouth 3 (three) times daily. For seizures   Yes Historical Provider, MD  lubiprostone (AMITIZA) 24 MCG capsule Take 1 capsule (24 mcg total) by mouth 2 (two) times daily with a meal. 12/09/13  Yes Beryle Beams, MD  Multiple Vitamin (MULTIVITAMIN WITH MINERALS) TABS Take 1 tablet by mouth daily.   Yes Historical Provider, MD  phenytoin  (DILANTIN) 100 MG ER capsule Take 100 mg by mouth See admin instructions. Give 1 tablet by mouth four times daily on Tuesday, Thursday, Saturday, and Sunday. Give 1 tablet by mouth three times daily on Monday, Wednesday, and Friday.   Yes Historical Provider, MD  polyethylene glycol (MIRALAX / GLYCOLAX) packet Take 17 g by mouth daily. For constipation   Yes Historical Provider, MD  pravastatin (PRAVACHOL) 20 MG tablet Take 20 mg by mouth daily. For HLD   Yes Historical Provider, MD  rivaroxaban (XARELTO) 20 MG TABS tablet Take 20 mg by mouth daily with supper.   Yes Historical Provider, MD  tamsulosin (FLOMAX) 0.4 MG CAPS capsule Take 0.4 mg by mouth daily. For BPH 03/21/13  Yes Historical Provider, MD  traMADol (ULTRAM) 50 MG tablet Take 50 mg by mouth every 6 (six) hours as needed for moderate pain.   Yes Historical Provider, MD  vitamin B-12 (CYANOCOBALAMIN) 1000 MCG tablet Take 1,000 mcg by mouth daily. For supplement   Yes Historical Provider, MD   Allergies  Allergen Reactions  . Penicillins     Mother and brother have had severe allergic reactions, no personal reaction    FAMILY HISTORY:  Family History  Problem Relation Age of Onset  . Vision loss Father   . Hypertension Father   . Diabetes Father   . Heart disease Father   . Cancer Father     colon  . Diabetes Mother   . Hypertension Mother   . Heart disease Mother   . Dementia Mother   . Cancer Mother     breast ca  . Diabetes Sister   . Hyperlipidemia Sister   . Hypertension Sister   . Diabetes Brother   . Kidney disease Brother   . Heart disease Brother    SOCIAL HISTORY:  reports that he quit smoking about 31 years ago. His smoking use included Pipe. He does not have any smokeless tobacco history on file. He reports that he does not drink alcohol or use illicit drugs.  REVIEW OF SYSTEMS:  Unable to obtain 2/2 intubated state.  SUBJECTIVE:   VITAL SIGNS: Temp:  [98.2 F (36.8 C)-100.8 F (38.2 C)] 98.2 F  (36.8 C) (08/16 2000) Pulse Rate:  [79-108] 80 (08/16 2100) Resp:  [14-28] 14 (08/16 2100) BP: (133-151)/(65-89) 133/65 mmHg (08/16 2000) SpO2:  [91 %-100 %] 99 % (08/16 2100) Arterial Line BP: (150-178)/(61-71) 150/61 mmHg (08/16 2100) FiO2 (%):  [40 %-100 %] 40 % (08/16 2137) Weight:  [290 lb 6 oz (131.713 kg)] 290 lb 6 oz (131.713 kg) (08/16 1606) HEMODYNAMICS:   VENTILATOR SETTINGS: Vent Mode:  [-] PSV FiO2 (%):  [40 %-100 %] 40 % Set Rate:  [14 bmp] 14 bmp Vt Set:  [650 mL] 650 mL PEEP:  [5 cmH20] 5 cmH20 Pressure Support:  [8 cmH20] 8 cmH20 Plateau Pressure:  [17 cmH20] 17 cmH20 INTAKE / OUTPUT: Intake/Output     08 /16 0701 - 08/17 0700   I.V. (  mL/kg) 4630 (35.2)   Blood 1288   Total Intake(mL/kg) 5918 (44.9)   Urine (mL/kg/hr) 750   Blood 300   Total Output 1050   Net +4868         PHYSICAL EXAMINATION: General:  Pale, elderly man appearing older than stated age. Neuro:  Intubated and sedated HEENT:  ETT in place, NGT in place, MMM Neck: No JVD noted. Cardiovascular:  Heart sounds dual and normal. Lungs:  CTAB. Abdomen: Bandages from recent ex lap in place (not removed). No contents noted in ostomy bags. No bowel sounds. Musculoskeletal:  No deformities. Skin:  Intact.  LABS:  CBC  Recent Labs Lab 12/25/13 1304 12/25/13 2000  WBC 17.1* 12.1*  HGB 16.5 12.3*  HCT 47.9 36.3*  PLT 201 187   Coag's  Recent Labs Lab 12/25/13 1510  APTT 44*  INR 1.87*   BMET  Recent Labs Lab 12/25/13 1304 12/25/13 2000  NA 136* 136*  K 3.1* 3.2*  CL 95* 100  CO2 28 25  BUN 9 9  CREATININE 0.73 0.55  GLUCOSE 114* 124*   Electrolytes  Recent Labs Lab 12/25/13 1304 12/25/13 2000  CALCIUM 8.8 7.5*   Sepsis Markers  Recent Labs Lab 12/25/13 1323  LATICACIDVEN 1.63   ABG  Recent Labs Lab 12/25/13 1958  PHART 7.428  PCO2ART 41.1  PO2ART 343.0*   Liver Enzymes  Recent Labs Lab 12/25/13 1304  AST 17  ALT 20  ALKPHOS 148*  BILITOT  0.5  ALBUMIN 2.8*   Cardiac Enzymes No results found for this basename: TROPONINI, PROBNP,  in the last 168 hours Glucose  Recent Labs Lab 12/25/13 2033  GLUCAP 117*    Imaging Dg Abd Acute W/chest  12/25/2013   CLINICAL DATA:  Abdominal pain  EXAM: ACUTE ABDOMEN SERIES (ABDOMEN 2 VIEW & CHEST 1 VIEW)  COMPARISON:  None.  FINDINGS: There is a large amount of free intraperitoneal gas. Distended loops of small and large bowel are scattered across the abdomen.  Low lung volumes.  Normal heart size.  IMPRESSION: Large amount of free intraperitoneal gas worrisome for ruptured bowel. Critical Value/emergent results were called by telephone at the time of interpretation on 12/25/2013 at 2:21 pm to Dr. Alvina Chou , who verbally acknowledged these results.   Electronically Signed   By: Maryclare Bean M.D.   On: 12/25/2013 14:21    EKG: NSR CXR: Pending  ASSESSMENT / PLAN:  PULMONARY A: Failure to liberate from mechanical ventilation P:   Will change from PRVC to pressure support tonight; patient is tolerating 8/5 on 40%. Recommend SBT with plan to extubate tomorrow if stable from a surgical standpoint tomorrow.  CARDIOVASCULAR A: History of CAD, but no acute issues P:     RENAL A: Hypokalemia P:   Replete K to maintain >3.5  GASTROINTESTINAL A: Perforation of transverse colon, s/p ex-lap w/ ileostomy. P:   Defer to surgery, but given important medications (oral anticoagulant, dilantin) need to determine when can take PO or alterative theraputic strategy for seizure d/o and recurrent DVTs.    HEMATOLOGIC A: Recurrent DVTs P:   Is presently still theraputic on xarelto, but will need to decide with surgery when patient can safely restart anticoagulation and with what mode.  INFECTIOUS A: Peritonitis due to perforation of large bowel P:   S/p ex lap, agree w/ Zoysn  ENDOCRINE A: No active Issues P:    NEUROLOGIC A: History of seizure disorder P:   Change Keppra  from PO  to IV, will check dilantin level. Consider fosphenytoin IV, or oral dilantin if OK to take PO.  TODAY'S SUMMARY:  - Changed to [facility] from [facility], plan to extubate tomorrow - Change keppra to IV - Need to plan for dilantin and anticoagulation   I have personally obtained a history, examined the patient, evaluated laboratory and imaging results, formulated the assessment and plan and placed orders.  CRITICAL CARE: The patient is critically ill with multiple organ systems failure and requires high complexity decision making for assessment and support, frequent evaluation and titration of therapies, application of advanced monitoring technologies and extensive interpretation of multiple databases. Critical Care Time devoted to patient care services described in this note is 50 minutes.   Luz Brazen, MD Pulmonary & Critical Care Medicine December 25, 2013, 10:05 PM

## 2013-12-25 NOTE — Consult Note (Signed)
Admission note: Bruce Seashore, MD  Primary care provider  Referring Physician: Alvina Chou  [name] is an 63 y.o. male.  HPI: This is an elderly male with complex medical issues, who has been in SNF since April 2015 with fractured ankle and all the medical issues listed below.  He isn't sure but he has had issue with abdominal distension at least since June of this year.  CT on 10/19/13 shows:  Colon is diffusely distended by gas and a small amount of fluid to the level of the proximal rectum where wall thickening and  associated perirectal infiltration are identified.  Additional scattered edema in the presacral space and sigmoid mesocolon.  No definite evidence of obstruction, mass or sigmoid volvulus.  Findings may represent proctitis either from infection or  inflammatory bowel disease, trauma not excluded, tumor considered  unlikely due to the long segment of mild wall thickening. He saw Dr. Benson Norway and had a flexible Sigmoidoscopy. He had diffuse colonic distension with some spontaneous contractions, consistent with a colonic ileus.  Mucosa appeared normal.  He place him on Amitza.  He was discharged back to SNF.  He says that actually helped, but abdomen has become more distended, he is tender.  He has been on liquids for the last several days.  He has a hard time telling what brought him to the ED today, he said it was just worse, and his sister says he has gotten to the point he can't even sit up. Work up in the ED; acute abdomen film with:  Large amount of free intraperitoneal gas worrisome for ruptured bowel. Large and small bowel distension.  His abdomen is massively distended, and tender. He was seen by Dr. Rosendo Gros and is going to OR for exploratory laparotomy ASAP.  He knows there is an increased risk with the Xarelto, he had his last dose this AM.   Past Medical History  Diagnosis Date  . Coronary artery disease   . Depression   . Peripheral vascular disease   .  Seizures   . Deep vein thrombophlebitis of left leg   . Deep vein thrombosis of right lower extremity   . Psoriasis   . Arthritis   . DVT (deep venous thrombosis)     multiple times  . Weakness of both legs   . Peripheral neuropathy   . Venous stasis   . Hyperplasia of prostate   . BPH (benign prostatic hyperplasia)   . Hyperlipidemia     Past Surgical History  Procedure Laterality Date  . Colonoscopy  03/26/2012    Procedure: COLONOSCOPY;  Surgeon: Beryle Beams, MD;  Location: [facility];  Service: Endoscopy;  Laterality: N/A;  . Tonsillectomy      as child  . Flexible sigmoidoscopy N/A 12/09/2013    Procedure: FLEXIBLE SIGMOIDOSCOPY;  Surgeon: Beryle Beams, MD;  Location: [facility];  Service: Endoscopy;  Laterality: N/A;    Family History  Problem Relation Age of Onset  . Vision loss Father   . Hypertension Father   . Diabetes Father   . Heart disease Father   . Cancer Father     colon  . Diabetes Mother   . Hypertension Mother   . Heart disease Mother   . Dementia Mother   . Cancer Mother     breast ca  . Diabetes Sister   . Hyperlipidemia Sister   . Hypertension Sister   . Diabetes Brother   . Kidney disease Brother   . Heart  disease Brother     Social History:  reports that he quit smoking about 31 years ago. His smoking use included Pipe. He does not have any smokeless tobacco history on file. He reports that he does not drink alcohol or use illicit drugs.  Allergies:  Allergies  Allergen Reactions  . Penicillins     Mother and brother have had severe allergic reactions, no personal reaction    Medications:  Prior to Admission:  (Not in a hospital admission) Scheduled:  Continuous: . 0.9 % NaCl with KCl 20 mEq / L    . piperacillin-tazobactam (ZOSYN)  IV    . vancomycin     PRN:0.9 % irrigation (POUR BTL), morphine injection, ondansetron Anti-infectives   Start     Dose/Rate Route Frequency Ordered Stop   12/25/13 2200   piperacillin-tazobactam (ZOSYN) IVPB 3.375 g     3.375 g 12.5 mL/hr over 240 Minutes Intravenous Every 8 hours 12/25/13 1521     12/25/13 1500  vancomycin (VANCOCIN) 1,250 mg in sodium chloride 0.9 % 250 mL IVPB     1,250 mg 166.7 mL/hr over 90 Minutes Intravenous  Once 12/25/13 1418     12/25/13 1430  piperacillin-tazobactam (ZOSYN) IVPB 3.375 g     3.375 g 100 mL/hr over 30 Minutes Intravenous  Once 12/25/13 1418 12/25/13 1502      Results for orders placed during the hospital encounter of 12/25/13 (from the past 48 hour(s))  CBC WITH DIFFERENTIAL     Status: Abnormal   Collection Time    12/25/13  1:04 PM      Result Value Ref Range   WBC 17.1 (*) 4.0 - 10.5 K/uL   RBC 5.13  4.22 - 5.81 MIL/uL   Hemoglobin 16.5  13.0 - 17.0 g/dL   HCT 47.9  39.0 - 52.0 %   MCV 93.4  78.0 - 100.0 fL   MCH 32.2  26.0 - 34.0 pg   MCHC 34.4  30.0 - 36.0 g/dL   RDW 12.8  11.5 - 15.5 %   Platelets 201  150 - 400 K/uL   Neutrophils Relative % 88 (*) 43 - 77 %   Neutro Abs 15.1 (*) 1.7 - 7.7 K/uL   Lymphocytes Relative 3 (*) 12 - 46 %   Lymphs Abs 0.6 (*) 0.7 - 4.0 K/uL   Monocytes Relative 9  3 - 12 %   Monocytes Absolute 1.5 (*) 0.1 - 1.0 K/uL   Eosinophils Relative 0  0 - 5 %   Eosinophils Absolute 0.0  0.0 - 0.7 K/uL   Basophils Relative 0  0 - 1 %   Basophils Absolute 0.0  0.0 - 0.1 K/uL  COMPREHENSIVE METABOLIC PANEL     Status: Abnormal   Collection Time    12/25/13  1:04 PM      Result Value Ref Range   Sodium 136 (*) 137 - 147 mEq/L   Potassium 3.1 (*) 3.7 - 5.3 mEq/L   Chloride 95 (*) 96 - 112 mEq/L   CO2 28  19 - 32 mEq/L   Glucose, Bld 114 (*) 70 - 99 mg/dL   BUN 9  6 - 23 mg/dL   Creatinine, Ser 0.73  0.50 - 1.35 mg/dL   Calcium 8.8  8.4 - 10.5 mg/dL   Total Protein 6.7  6.0 - 8.3 g/dL   Albumin 2.8 (*) 3.5 - 5.2 g/dL   AST 17  0 - 37 U/L   ALT 20  0 - 53  U/L   Alkaline Phosphatase 148 (*) 39 - 117 U/L   Total Bilirubin 0.5  0.3 - 1.2 mg/dL   GFR calc non Af Amer >90  >90  mL/min   GFR calc Af Amer >90  >90 mL/min   Comment: (NOTE)     The eGFR has been calculated using the CKD EPI equation.     This calculation has not been validated in all clinical situations.     eGFR's persistently <90 mL/min signify possible Chronic Kidney     Disease.   Anion gap 13  5 - 15  LIPASE, BLOOD     Status: None   Collection Time    12/25/13  1:04 PM      Result Value Ref Range   Lipase 15  11 - 59 U/L  I-STAT CG4 LACTIC ACID, ED     Status: None   Collection Time    12/25/13  1:23 PM      Result Value Ref Range   Lactic Acid, Venous 1.63  0.5 - 2.2 mmol/L    Dg Abd Acute W/chest  12/25/2013   CLINICAL DATA:  Abdominal pain  EXAM: ACUTE ABDOMEN SERIES (ABDOMEN 2 VIEW & CHEST 1 VIEW)  COMPARISON:  None.  FINDINGS: There is a large amount of free intraperitoneal gas. Distended loops of small and large bowel are scattered across the abdomen.  Low lung volumes.  Normal heart size.  IMPRESSION: Large amount of free intraperitoneal gas worrisome for ruptured bowel. Critical Value/emergent results were called by telephone at the time of interpretation on 12/25/2013 at 2:21 pm to Dr. Alvina Chou , who verbally acknowledged these results.   Electronically Signed   By: Maryclare Bean M.D.   On: 12/25/2013 14:21    Review of Systems  Constitutional: Positive for fever. Negative for chills, weight loss, malaise/fatigue and diaphoresis.  HENT: Negative.   Eyes: Negative.   Respiratory: Negative.   Cardiovascular: Positive for leg swelling. Palpitations: psoriasis   Gastrointestinal: Positive for heartburn, abdominal pain and diarrhea (some diarrhea yesterday.).       He came to ED because abdomen so swollen he cannot sit up  Genitourinary: Negative.   Musculoskeletal: Positive for joint pain.  Skin: Positive for rash.  Neurological: Positive for sensory change (neuropathy changes both lower legs reportedly due to seizure meds.) and seizures. Negative for focal weakness, loss of  consciousness and weakness.  Endo/Heme/Allergies: Bruises/bleeds easily: on coumadin since early 2000's changed to Xarelto because of difficulty keeping INR theraputic.  Psychiatric/Behavioral: Positive for depression.   Blood pressure 148/73, pulse 108, temperature 98.8 F (37.1 C), temperature source Oral, resp. rate 28, SpO2 93.00%. Physical Exam  Constitutional: He is oriented to person, place, and time. No distress.  Morbidly obese male who looks better than he should.  He is alert and mentating normally. His abdomen is massively swollen.  HENT:  Head: Normocephalic and atraumatic.  Nose: Nose normal.  Eyes: Conjunctivae and EOM are normal. Pupils are equal, round, and reactive to light. Right eye exhibits no discharge. Left eye exhibits no discharge. No scleral icterus.  Neck: Normal range of motion. Neck supple. No JVD present. No tracheal deviation present. No thyromegaly present.  Cardiovascular: Regular rhythm, normal heart sounds and intact distal pulses.  Exam reveals no gallop.   No murmur heard. Tachycardic   Respiratory: Effort normal and breath sounds normal. He has no wheezes. He has no rales. He exhibits no tenderness.  BS clear anterior chest, he can't really  roll over.  GI: He exhibits distension. He exhibits no mass. There is tenderness. There is rebound. There is no guarding.  Most distended abdomen I have ever seen. Umbilicus is just about smooth.  Musculoskeletal: He exhibits edema.  Lymphadenopathy:    He has no cervical adenopathy.  Neurological: He is alert and oriented to person, place, and time. No cranial nerve deficit.  Skin: Skin is warm and dry. No rash noted. He is not diaphoretic. No erythema. No pallor.  He has psoriasis and venous stasis issues.  Left leg is worse than the right.  Psychiatric: He has a normal mood and affect. His behavior is normal. Judgment and thought content normal.    Assessment/Plan: 1.  Free intraperitoneal air, of  uncertain etiology. 2.  Chronic debilitation, he has been in SNF since $/2015 3.  Chronic anticoagulation for DVT, since early 2000's.  He was switched to Xarelto because of trouble maintaining INR. 4.  Morbid obesity   5.  Seizures 6.  Possible CAD, patient denies this. 7.  Peripheral neuropathy 8.  Depression 9.  Ankle fracture left in April; SNF since that time.  ( he was just starting to bear weight) 10.  Venous stasis 11.  PVD 12.  BPH  PLAN:  He has received a dose of Zosyn, and we are going to take him directly to the OR.  We have requested and icu bed, he has said he did not want full code once he got thru surgery, full code now.   Alwilda Gilland 12/25/2013, 3:21 PM

## 2013-12-25 NOTE — ED Provider Notes (Signed)
CSN: [id number]     Arrival date & time 12/25/13  1243 History   First MD Initiated Contact with Patient 12/25/13 1244     Chief Complaint  Patient presents with  . Abdominal Pain     (Consider location/radiation/quality/duration/timing/severity/associated sxs/prior Treatment) HPI Comments: Patient is a 63 year old male with a past medical history of CAD, seizures and peripheral neuropathy who presents with a 3 week history of abdominal pain and distension. The pain is located in his generalized abdomen and does not radiate. The pain is described as pressure and severe. The pain started gradually and progressively worsened since the onset. Patient reports being xrayed twice at [facility] and was told he had "gas and a sluggish colon." No alleviating/aggravating factors. The patient has tried nothing for symptoms without relief. Associated symptoms include 1 episode of diarrhea yesterday. He reports regular bowel movements. Patient denies fever, headache, NVD, chest pain, SOB, dysuria, constipation. Patient reports having a flex sigmoid colonoscopy 10 days ago by Dr. Benson Norway.     Past Medical History  Diagnosis Date  . Coronary artery disease   . Depression   . Peripheral vascular disease   . Seizures   . Deep vein thrombophlebitis of left leg   . Deep vein thrombosis of right lower extremity   . Psoriasis   . Arthritis   . DVT (deep venous thrombosis)     multiple times  . Weakness of both legs   . Peripheral neuropathy   . Venous stasis   . Hyperplasia of prostate   . BPH (benign prostatic hyperplasia)   . Hyperlipidemia    Past Surgical History  Procedure Laterality Date  . Colonoscopy  03/26/2012    Procedure: COLONOSCOPY;  Surgeon: Beryle Beams, MD;  Location: [facility];  Service: Endoscopy;  Laterality: N/A;  . Tonsillectomy      as child  . Flexible sigmoidoscopy N/A 12/09/2013    Procedure: FLEXIBLE SIGMOIDOSCOPY;  Surgeon: Beryle Beams, MD;  Location:  [facility];  Service: Endoscopy;  Laterality: N/A;   Family History  Problem Relation Age of Onset  . Vision loss Father   . Hypertension Father   . Diabetes Father   . Heart disease Father   . Cancer Father     colon  . Diabetes Mother   . Hypertension Mother   . Heart disease Mother   . Dementia Mother   . Cancer Mother     breast ca  . Diabetes Sister   . Hyperlipidemia Sister   . Hypertension Sister   . Diabetes Brother   . Kidney disease Brother   . Heart disease Brother    History  Substance Use Topics  . Smoking status: Former Smoker    Types: Pipe    Quit date: 03/26/1982  . Smokeless tobacco: Not on file  . Alcohol Use: No    Review of Systems  Constitutional: Negative for fever, chills and fatigue.  HENT: Negative for trouble swallowing.   Eyes: Negative for visual disturbance.  Respiratory: Negative for shortness of breath.   Cardiovascular: Negative for chest pain and palpitations.  Gastrointestinal: Positive for abdominal pain and abdominal distention. Negative for nausea, vomiting and diarrhea.  Genitourinary: Negative for dysuria and difficulty urinating.  Musculoskeletal: Negative for arthralgias and neck pain.  Skin: Negative for color change.  Neurological: Negative for dizziness and weakness.  Psychiatric/Behavioral: Negative for dysphoric mood.      Allergies  Penicillins  Home Medications   Prior to  Admission medications   Medication Sig Start Date End Date Taking? Authorizing Provider  acetaminophen (TYLENOL) 500 MG tablet Take 500 mg by mouth 2 (two) times daily as needed for moderate pain.     Historical Provider, MD  Calcium Carb-Cholecalciferol (CALCIUM-VITAMIN D) 600-400 MG-UNIT TABS Take 1 tablet by mouth daily. For calcium supplement    Historical Provider, MD  docusate sodium (COLACE) 100 MG capsule Take 1 capsule (100 mg total) by mouth 2 (two) times daily. 10/19/13   Carmin Muskrat, MD  FLUoxetine (PROZAC) 20 MG capsule Take  20 mg by mouth every morning. For depression    Historical Provider, MD  hyoscyamine (LEVSIN, ANASPAZ) 0.125 MG tablet Take 0.125 mg by mouth every 4 (four) hours as needed for bladder spasms.    Historical Provider, MD  levETIRAcetam (KEPPRA) 750 MG tablet Take 1,500 mg by mouth 3 (three) times daily. For seizures    Historical Provider, MD  lubiprostone (AMITIZA) 24 MCG capsule Take 1 capsule (24 mcg total) by mouth 2 (two) times daily with a meal. 12/09/13   Beryle Beams, MD  Multiple Vitamin (MULTIVITAMIN WITH MINERALS) TABS Take 1 tablet by mouth daily.    Historical Provider, MD  phenytoin (DILANTIN) 100 MG ER capsule Take 100 mg by mouth See admin instructions. Give 1 tablet by mouth four times daily on Tuesday, Thursday, Saturday, and Sunday. Give 1 tablet by mouth three times daily on Monday, Wednesday, and Friday.    Historical Provider, MD  polyethylene glycol (MIRALAX / GLYCOLAX) packet Take 17 g by mouth daily. For constipation    Historical Provider, MD  pravastatin (PRAVACHOL) 20 MG tablet Take 20 mg by mouth daily. For HLD    Historical Provider, MD  rivaroxaban (XARELTO) 20 MG TABS tablet Take 20 mg by mouth daily with supper.    Historical Provider, MD  tamsulosin (FLOMAX) 0.4 MG CAPS capsule Take 0.4 mg by mouth daily. For BPH 03/21/13   Historical Provider, MD  traMADol (ULTRAM) 50 MG tablet Take one tablet by mouth every 6 hours as needed for pain 12/16/13   Wille Celeste, PA-C  vitamin B-12 (CYANOCOBALAMIN) 1000 MCG tablet Take 1,000 mcg by mouth daily. For supplement    Historical Provider, MD   There were no vitals taken for this visit. Physical Exam  Nursing note and vitals reviewed. Constitutional: He is oriented to person, place, and time. He appears well-developed and well-nourished. No distress.  HENT:  Head: Normocephalic and atraumatic.  Eyes: Conjunctivae and EOM are normal. Pupils are equal, round, and reactive to light. No scleral icterus.  Neck: Normal range of  motion.  Cardiovascular: Normal rate and regular rhythm.  Exam reveals no gallop and no friction rub.   No murmur heard. Pulmonary/Chest: Effort normal and breath sounds normal. He has no wheezes. He has no rales. He exhibits no tenderness.  Abdominal: Soft. He exhibits distension. There is tenderness. There is no rebound and no guarding.  Extreme abdominal distension noted with generalized tenderness to palpation.   Musculoskeletal: Normal range of motion.  Neurological: He is alert and oriented to person, place, and time. Coordination normal.  Speech is goal-oriented. Moves limbs without ataxia.   Skin: Skin is warm and dry.  Psychiatric: He has a normal mood and affect. His behavior is normal.    ED Course  Procedures (including critical care time)  CRITICAL CARE Performed by: Alvina Chou   Total critical care time: 30 minutes  Critical care time was exclusive of separately  billable procedures and treating other patients.  Critical care was necessary to treat or prevent imminent or life-threatening deterioration.  Critical care was time spent personally by me on the following activities: development of treatment plan with patient and/or surrogate as well as nursing, discussions with consultants, evaluation of patient's response to treatment, examination of patient, obtaining history from patient or surrogate, ordering and performing treatments and interventions, ordering and review of laboratory studies, ordering and review of radiographic studies, pulse oximetry and re-evaluation of patient's condition.   Labs Review Labs Reviewed  CBC WITH DIFFERENTIAL - Abnormal; Notable for the following:    WBC 17.1 (*)    Neutrophils Relative % 88 (*)    Neutro Abs 15.1 (*)    Lymphocytes Relative 3 (*)    Lymphs Abs 0.6 (*)    Monocytes Absolute 1.5 (*)    All other components within normal limits  COMPREHENSIVE METABOLIC PANEL - Abnormal; Notable for the following:    Sodium  136 (*)    Potassium 3.1 (*)    Chloride 95 (*)    Glucose, Bld 114 (*)    Albumin 2.8 (*)    Alkaline Phosphatase 148 (*)    All other components within normal limits  CULTURE, BLOOD (ROUTINE X 2)  CULTURE, BLOOD (ROUTINE X 2)  LIPASE, BLOOD  APTT  PROTIME-INR  I-STAT CG4 LACTIC ACID, ED  TYPE AND SCREEN    Imaging Review Dg Abd Acute W/chest  12/25/2013   CLINICAL DATA:  Abdominal pain  EXAM: ACUTE ABDOMEN SERIES (ABDOMEN 2 VIEW & CHEST 1 VIEW)  COMPARISON:  None.  FINDINGS: There is a large amount of free intraperitoneal gas. Distended loops of small and large bowel are scattered across the abdomen.  Low lung volumes.  Normal heart size.  IMPRESSION: Large amount of free intraperitoneal gas worrisome for ruptured bowel. Critical Value/emergent results were called by telephone at the time of interpretation on 12/25/2013 at 2:21 pm to Dr. Alvina Chou , who verbally acknowledged these results.   Electronically Signed   By: Maryclare Bean M.D.   On: 12/25/2013 14:21     EKG Interpretation None      MDM   Final diagnoses:  Free intraperitoneal air    2:59 PM Patient has large amount of free intraperitoneal air worrisome for ruptured bowel. General surgery saw the patient who will admit the patient. Labs show increased WBC at 17.1. Patient is febrile and tachycardic here. Patient started on Vancomycin and Zosyn here. General surgery will admit the patient.    Alvina Chou, PA-C 12/25/13 1544

## 2013-12-25 NOTE — Anesthesia Procedure Notes (Addendum)
Procedure Name: Intubation Date/Time: 12/25/2013 4:19 PM Performed by: Jacquiline Doe A Pre-anesthesia Checklist: Patient identified, Timeout performed, Emergency Drugs available, Suction available and Patient being monitored Patient Re-evaluated:Patient Re-evaluated prior to inductionOxygen Delivery Method: Circle system utilized Preoxygenation: Pre-oxygenation with 100% oxygen Intubation Type: IV induction, Rapid sequence and Cricoid Pressure applied Laryngoscope Size: Mac and 4 Grade View: Grade I Tube type: Subglottic suction tube Tube size: 8.0 mm Number of attempts: 1 Airway Equipment and Method: Rigid stylet and Video-laryngoscopy Placement Confirmation: ETT inserted through vocal cords under direct vision,  breath sounds checked- equal and bilateral and positive ETCO2 Secured at: 24 cm Dental Injury: Teeth and Oropharynx as per pre-operative assessment  Difficulty Due To: Difficulty was anticipated, Difficult Airway- due to reduced neck mobility, Difficult Airway- due to large tongue, Difficult Airway- due to limited oral opening and Difficult Airway- due to dentition Comments: Pt with full beard also .      CVP

## 2013-12-25 NOTE — ED Provider Notes (Signed)
Medical screening examination/treatment/procedure(s) were conducted as a shared visit with non-physician practitioner(s) and myself.  I personally evaluated the patient during the encounter.   EKG Interpretation None      Patient presents with abdominal pain and distention worsening over the last several days.  Reports he has had 6 weeks of pain and distention on and off.  Flex sig by Dr. Benson Norway approximately 10 days which helped relieve some of his symptoms but have worsened.  Noted to be febrile here.  Severely distended abdomen with TTP.  Lab work with leukocytosis and normal lactate.  KUB with extensive free air in the abdomen.  Surgery consulted.  Blood cultures sent and abx initiated.  Patient will be taken emergently to the OR.  CRITICAL CARE Performed by: Thayer Jew, F   Total critical care time: 35 min  Critical care time was exclusive of separately billable procedures and treating other patients.  Critical care was necessary to treat or prevent imminent or life-threatening deterioration.  Critical care was time spent personally by me on the following activities: development of treatment plan with patient and/or surrogate as well as nursing, discussions with consultants, evaluation of patient's response to treatment, examination of patient, obtaining history from patient or surrogate, ordering and performing treatments and interventions, ordering and review of laboratory studies, ordering and review of radiographic studies, pulse oximetry and re-evaluation of patient's condition.   Merryl Hacker, MD 12/25/13 [phone]

## 2013-12-25 NOTE — Op Note (Signed)
12/25/2013  6:44 PM  PATIENT:  [name]  63 y.o. male  PRE-OPERATIVE DIAGNOSIS: perforated viscus  POST-OPERATIVE DIAGNOSIS:  Perforated and ischemic transverse colon  PROCEDURE:  Procedure(s): EXPLORATORY LAPAROTOMY/ ProximalTransverse and right coloectomy/ end  ileostomy and mucus  fistula (N/A)  SURGEON:  Surgeon(s) and Role:    * Edward Jolly, MD - Assisting    * Ralene Ok, MD - Primary  ASSISTANTS: none   ANESTHESIA:   general  EBL:  Total I/O In: 3474 [I.V.:4000; Blood:1288] Out: 600 [Urine:400; Blood:200]  BLOOD ADMINISTERED:none  DRAINS: none   LOCAL MEDICATIONS USED:  NONE  SPECIMEN:  Source of Specimen:  Proximal transverse and right colon  DISPOSITION OF SPECIMEN:  PATHOLOGY  COUNTS:  YES  TOURNIQUET:  * No tourniquets in log *  DICTATION: .Dragon Dictation The patient was taking back operating room and placed in the supine position with bilateral using place. After appropriate antibiotics were confirmed a timeout was called and all facts were verified.  A midline laparotomy incision was made using a 10 blade. Electrocautery was used to maintain hemostasis and dissection was taken down to the fascial level. The peritoneum was bluntly entered. There was a large amount of air that was released upon entering his abdomen. This help deflate his abdominal wall. The incision was extended to the length of the initial excision. At this time we locally explored his abdomen. There was a minimal amount of spillage of stool from this bowel. There was a minimal amount of purulence that could be seen. At this time a Bookwalter self-retaining retractor was placed. His abdominal wall was retracted. Upon examining his colon his entire colon from his right to his sigmoid colon was dramatically dilated. At this time were able to visualize the area of perforation which seemed to be the midportion of the transverse colon.  At this time we proceeded to dissect away  the omentum had wrapped around the transverse colon away from the transverse colon. We did this both bluntly and with electrocautery. A distal portion of the colon just distal to the middle rectal vessels were as chosen as a transection point a 75 GIA stapler was used to transect the colon in this area. At this time were able to retract the colon anteriorly and help dissect the posterior portion of the omental portion off of the colon. The hepatic flexure was high and underneath the liver. We freed up the right colon from the retroperitoneal attachments by incising the white line of Toldt. We did this from the cecum proceeding distally to the hepatic flexure. A LigaSure device was used to help attach the hepatic flexure of the colon from the retroperitoneal adhesions.  This allowed [place] to medialize the right colon and transverse colon medially. We dissected off the duodenum from the mesentery of the colon.  At this time we decided to transect the terminal ileum. This was done using a 75 GIA stapler. We mobilized the appendix and cecum from the retroperitoneal structures. In doing so we were able to transect the mesentery of the right colon the transverse colon.  The ileocolic and middle colic vessels were tied with 0 silk ties and then transected. This freed up the entire specimen. This was handed off for pathology.  At this time we proceeded to mobilize the distal transverse colon from the omentum. This freed up approximately 8-10 cm  From the transected end of the colon. At this time we irrigated out the abdomen was 4 L of  sterile saline. Reexamined all fresh edges of the transected mesentery for hemostasis which was excellent.  At this time we chose a site for the ileostomy and mucous fistula. This was done both above the umbilicus on the right side and approximately 3-4 fingerbreadths from the left subcostal margin. A skin defect was made on each side and out cautery was used to dissect down to the fascial  level. The peritoneum was entered to allow 2 fingerbreadths to dilate the peritoneum and the fascial level on each side. The ileostomy was then brought up through the ileostomy site and the mucous fistula was brought out through the mucous fistula site.  At this time #1 looped Prolene was used to reapproximate the fascia in a standard running fashion. The ileostomy was matured in a Brooke fashion using 3-0 Vicryl as an interrupted fashion. The mucous fistula was matured using 3-0 Vicryl's. Ostomy appliances were placed on each ostomy.  The midline was packed with Kerlix soaked in saline. This was dressed with ABDs pad and tape.  The patient tolerated the procedure well and was taken to the ICU in guarded condition.  PLAN OF CARE: Admit to inpatient   PATIENT DISPOSITION:  PACU - hemodynamically stable.   Delay start of Pharmacological VTE agent (>24hrs) due to surgical blood loss or risk of bleeding: not applicable

## 2013-12-25 NOTE — ED Notes (Signed)
General surgery at bedside. 

## 2013-12-25 NOTE — Anesthesia Preprocedure Evaluation (Addendum)
Anesthesia Evaluation  Patient identified by MRN, date of birth, ID band Patient awake    Reviewed: Allergy & Precautions, H&P , NPO status , Patient's Chart, lab work & pertinent test results  Airway Mallampati: III TM Distance: >3 FB Neck ROM: Full    Dental no notable dental hx. (+) Teeth Intact, Dental Advisory Given   Pulmonary neg pulmonary ROS, former smoker,  breath sounds clear to auscultation  Pulmonary exam normal       Cardiovascular + CAD and + Peripheral Vascular Disease Rhythm:Regular Rate:Normal     Neuro/Psych Seizures -, Well Controlled,  Depression negative neurological ROS     GI/Hepatic negative GI ROS, Neg liver ROS,   Endo/Other  negative endocrine ROS  Renal/GU negative Renal ROS  negative genitourinary   Musculoskeletal   Abdominal   Peds  Hematology negative hematology ROS (+)   Anesthesia Other Findings   Reproductive/Obstetrics negative OB ROS                          Anesthesia Physical Anesthesia Plan  ASA: III and emergent  Anesthesia Plan: General   Post-op Pain Management:    Induction: Intravenous, Rapid sequence and Cricoid pressure planned  Airway Management Planned: Oral ETT and Video Laryngoscope Planned  Additional Equipment: Arterial line, CVP and Ultrasound Guidance Line Placement  Intra-op Plan:   Post-operative Plan: Extubation in OR and Possible Post-op intubation/ventilation  Informed Consent: I have reviewed the patients History and Physical, chart, labs and discussed the procedure including the risks, benefits and alternatives for the proposed anesthesia with the patient or authorized representative who has indicated his/her understanding and acceptance.   Dental advisory given  Plan Discussed with: CRNA  Anesthesia Plan Comments:        Anesthesia Quick Evaluation

## 2013-12-26 ENCOUNTER — Encounter (HOSPITAL_COMMUNITY): Payer: Self-pay | Admitting: General Surgery

## 2013-12-26 ENCOUNTER — Inpatient Hospital Stay (HOSPITAL_COMMUNITY): Payer: Medicare Other

## 2013-12-26 DIAGNOSIS — R69 Illness, unspecified: Secondary | ICD-10-CM

## 2013-12-26 DIAGNOSIS — E785 Hyperlipidemia, unspecified: Secondary | ICD-10-CM

## 2013-12-26 DIAGNOSIS — R569 Unspecified convulsions: Secondary | ICD-10-CM

## 2013-12-26 DIAGNOSIS — Z86718 Personal history of other venous thrombosis and embolism: Secondary | ICD-10-CM

## 2013-12-26 DIAGNOSIS — K668 Other specified disorders of peritoneum: Secondary | ICD-10-CM

## 2013-12-26 LAB — PREPARE FRESH FROZEN PLASMA
UNIT DIVISION: 0
Unit division: 0

## 2013-12-26 LAB — PREPARE PLATELET PHERESIS
UNIT DIVISION: 0
Unit division: 0

## 2013-12-26 LAB — GLUCOSE, CAPILLARY
GLUCOSE-CAPILLARY: 103 mg/dL — AB (ref 70–99)
Glucose-Capillary: 100 mg/dL — ABNORMAL HIGH (ref 70–99)
Glucose-Capillary: 110 mg/dL — ABNORMAL HIGH (ref 70–99)

## 2013-12-26 LAB — CBC
HEMATOCRIT: 37.8 % — AB (ref 39.0–52.0)
Hemoglobin: 12.5 g/dL — ABNORMAL LOW (ref 13.0–17.0)
MCH: 30.7 pg (ref 26.0–34.0)
MCHC: 33.1 g/dL (ref 30.0–36.0)
MCV: 92.9 fL (ref 78.0–100.0)
Platelets: 188 10*3/uL (ref 150–400)
RBC: 4.07 MIL/uL — ABNORMAL LOW (ref 4.22–5.81)
RDW: 12.9 % (ref 11.5–15.5)
WBC: 10.8 10*3/uL — ABNORMAL HIGH (ref 4.0–10.5)

## 2013-12-26 LAB — BASIC METABOLIC PANEL
Anion gap: 8 (ref 5–15)
BUN: 9 mg/dL (ref 6–23)
CALCIUM: 7.7 mg/dL — AB (ref 8.4–10.5)
CO2: 26 meq/L (ref 19–32)
CREATININE: 0.55 mg/dL (ref 0.50–1.35)
Chloride: 103 mEq/L (ref 96–112)
GFR calc Af Amer: >90 mL/min (ref >90)
GFR calc non Af Amer: >90 mL/min (ref >90)
GLUCOSE: 106 mg/dL — AB (ref 70–99)
Potassium: 3.7 mEq/L (ref 3.7–5.3)
Sodium: 137 mEq/L (ref 137–147)

## 2013-12-26 LAB — MRSA PCR SCREENING: MRSA by PCR: NEGATIVE

## 2013-12-26 MED ORDER — SODIUM CHLORIDE 0.9 % IJ SOLN
10.0000 mL | INTRAMUSCULAR | Status: DC | PRN
  Administered 2013-12-30: 10 mL

## 2013-12-26 MED ORDER — CHLORHEXIDINE GLUCONATE 0.12 % MT SOLN
15.0000 mL | Freq: Two times a day (BID) | OROMUCOSAL | Status: DC
  Administered 2013-12-26 – 2014-01-03 (×15): 15 mL via OROMUCOSAL
  Filled 2013-12-26 (×14): qty 15

## 2013-12-26 MED ORDER — SODIUM CHLORIDE 0.9 % IJ SOLN
10.0000 mL | Freq: Two times a day (BID) | INTRAMUSCULAR | Status: DC
  Administered 2013-12-26 (×2): 10 mL
  Administered 2013-12-27: 30 mL

## 2013-12-26 MED ORDER — ENOXAPARIN SODIUM 80 MG/0.8ML ~~LOC~~ SOLN
0.5000 mg/kg | Freq: Every day | SUBCUTANEOUS | Status: DC
  Administered 2013-12-26 – 2013-12-27 (×2): 65 mg via SUBCUTANEOUS
  Filled 2013-12-26 (×2): qty 0.8

## 2013-12-26 MED ORDER — PHENYTOIN SODIUM 50 MG/ML IJ SOLN: 100.0000 mg | Freq: Three times a day (TID) | INTRAMUSCULAR | Status: DC

## 2013-12-26 MED ORDER — PHENYTOIN SODIUM 50 MG/ML IJ SOLN: 100.0000 mg | Freq: Four times a day (QID) | INTRAMUSCULAR | Status: DC

## 2013-12-26 MED ORDER — SODIUM CHLORIDE 0.9 % IJ SOLN
3.0000 mL | Freq: Two times a day (BID) | INTRAMUSCULAR | Status: DC
  Administered 2013-12-26 – 2014-01-02 (×10): 3 mL via INTRAVENOUS

## 2013-12-26 MED ORDER — PHENYTOIN SODIUM 50 MG/ML IJ SOLN
100.0000 mg | INTRAMUSCULAR | Status: DC
  Administered 2013-12-26 – 2013-12-28 (×6): 100 mg via INTRAVENOUS
  Filled 2013-12-26 (×7): qty 2

## 2013-12-26 MED ORDER — PHENYTOIN SODIUM 50 MG/ML IJ SOLN
100.0000 mg | INTRAMUSCULAR | Status: DC
  Administered 2013-12-27 – 2014-01-01 (×14): 100 mg via INTRAVENOUS
  Filled 2013-12-26 (×19): qty 2

## 2013-12-26 MED ORDER — CETYLPYRIDINIUM CHLORIDE 0.05 % MT LIQD
7.0000 mL | Freq: Two times a day (BID) | OROMUCOSAL | Status: DC
  Administered 2013-12-26 – 2014-01-03 (×14): 7 mL via OROMUCOSAL

## 2013-12-26 MED ORDER — SODIUM CHLORIDE 0.9 % IJ SOLN
3.0000 mL | INTRAMUSCULAR | Status: DC | PRN
  Administered 2013-12-26 (×2): 3 mL via INTRAVENOUS

## 2013-12-26 NOTE — Consult Note (Addendum)
PULMONARY  / CRITICAL CARE MEDICINE CONSULTATION   Name: [name] MRN: [id number] DOB: 03-23-1951    ADMISSION DATE:  12/25/2013 CONSULTATION DATE: 12/25/2013 REQUESTING CLINICIAN: Md Ccs, MD  CHIEF COMPLAINT:  Intubated  BRIEF PATIENT DESCRIPTION:  63 y/o man with seizure d/o and perforated viscus s/p ex lap w/ ileostomy.  Remained on vent post-op and PCCM consulted to assist with vent management.  SIGNIFICANT EVENTS: 8/16 Admit, emergent laparotomy >> ProximalTransverse and right coloectomy/ end ileostomy and mucus fistula.  STUDIES:   SUBJECTIVE:  Tolerating SBT.  Denies chest pain, dypsnea.  VITAL SIGNS: Temp:  [98.2 F (36.8 C)-100.8 F (38.2 C)] 98.2 F (36.8 C) (08/17 0750) Pulse Rate:  [70-108] 74 (08/17 0800) Resp:  [11-28] 23 (08/17 0800) BP: (94-151)/(50-89) 109/61 mmHg (08/17 0800) SpO2:  [91 %-100 %] 100 % (08/17 0800) Arterial Line BP: (110-200)/(49-79) 150/60 mmHg (08/17 0800) FiO2 (%):  [40 %-100 %] 40 % (08/17 0800) Weight:  [290 lb 6 oz (131.713 kg)] 290 lb 6 oz (131.713 kg) (08/16 1606) VENTILATOR SETTINGS: Vent Mode:  [-] PSV;CPAP FiO2 (%):  [40 %-100 %] 40 % Set Rate:  [14 bmp] 14 bmp Vt Set:  [650 mL] 650 mL PEEP:  [5 cmH20] 5 cmH20 Pressure Support:  [5 cmH20-8 cmH20] 5 cmH20 Plateau Pressure:  [17 cmH20] 17 cmH20 INTAKE / OUTPUT: Intake/Output     08/16 0701 - 08/17 0700 08/17 0701 - 08/18 0700   I.V. (mL/kg) 5546.4 (42.1) 100 (0.8)   Blood 1288    IV Piggyback 600    Total Intake(mL/kg) 7434.4 (56.4) 100 (0.8)   Urine (mL/kg/hr) 1590    Stool 1    Blood 300    Total Output 1891 0   Net +5543.4 +100          PHYSICAL EXAMINATION: General: no distress Neuro: follows commands, moves extremities HEENT:  ETT in place Cardiovascular: regular Lungs: no wheeze Abdomen: Ileostomy/colostomy in place, wound dressing clean Musculoskeletal: no edema Skin: no rashes  LABS:  CBC  Recent Labs Lab 12/25/13 1304 12/25/13 2000  12/26/13 0408  WBC 17.1* 12.1* 10.8*  HGB 16.5 12.3* 12.5*  HCT 47.9 36.3* 37.8*  PLT 201 187 188   Coag's  Recent Labs Lab 12/25/13 1510  APTT 44*  INR 1.87*   BMET  Recent Labs Lab 12/25/13 1304 12/25/13 2000 12/26/13 0408  NA 136* 136* 137  K 3.1* 3.2* 3.7  CL 95* 100 103  CO2 28 25 26   BUN 9 9 9   CREATININE 0.73 0.55 0.55  GLUCOSE 114* 124* 106*   Electrolytes  Recent Labs Lab 12/25/13 1304 12/25/13 2000 12/26/13 0408  CALCIUM 8.8 7.5* 7.7*   Sepsis Markers  Recent Labs Lab 12/25/13 1323  LATICACIDVEN 1.63   ABG  Recent Labs Lab 12/25/13 1958 12/25/13 2221  PHART 7.428 7.438  PCO2ART 41.1 41.4  PO2ART 343.0* 109.0*   Liver Enzymes  Recent Labs Lab 12/25/13 1304  AST 17  ALT 20  ALKPHOS 148*  BILITOT 0.5  ALBUMIN 2.8*   Glucose  Recent Labs Lab 12/25/13 2033 12/26/13 0756  GLUCAP 117* 100*    Imaging Dg Chest Port 1 View  12/26/2013   CLINICAL DATA:  Hypoxia  EXAM: PORTABLE CHEST - 1 VIEW  COMPARISON:  December 25, 2013  FINDINGS: Endotracheal tube tip is 4.1 cm above the carina. Central catheter tip is in the superior vena cava. Nasogastric tube tip and side port are below the diaphragm. No pneumothorax. There is a  right effusion with right base consolidation. Left lung is clear. Heart is upper normal in size with pulmonary vascularity within normal limits.  IMPRESSION: Tube and catheter positions as described without pneumothorax. Right base infiltrate with small right effusion. Left lung clear.   Electronically Signed   By: Lowella Grip M.D.   On: 12/26/2013 07:15   Dg Abd Acute W/chest  12/25/2013   CLINICAL DATA:  Abdominal pain  EXAM: ACUTE ABDOMEN SERIES (ABDOMEN 2 VIEW & CHEST 1 VIEW)  COMPARISON:  None.  FINDINGS: There is a large amount of free intraperitoneal gas. Distended loops of small and large bowel are scattered across the abdomen.  Low lung volumes.  Normal heart size.  IMPRESSION: Large amount of free  intraperitoneal gas worrisome for ruptured bowel. Critical Value/emergent results were called by telephone at the time of interpretation on 12/25/2013 at 2:21 pm to Dr. Alvina Chou , who verbally acknowledged these results.   Electronically Signed   By: Maryclare Bean M.D.   On: 12/25/2013 14:21    ASSESSMENT / PLAN: GASTROINTESTINAL A: Perforation of transverse colon, s/p ex-lap w/ ileostomy. P:   Post-op care, nutrition per CCS D/c amitiza  PULMONARY ETT 8/16 >> 8/17  A: Remained on vent post-op. Atelectasis. P:   Proceed with extubation 8/17  Bronchial hygiene  CARDIOVASCULAR Rt IJ CVL 8/16 >> Lt radial aline 8/16 >> 8/17   A: Hx of CAD, PVD, hyperlipidemia. P:   Monitor hemodynamics Hold outpt pravachol for now  RENAL A: Hypokalemia. Hx of BPH. P:   Monitor renal fx, urine outpt F/u and replace electrolytes as needed Hold outpt flomax  HEMATOLOGIC A: Hx of recurrent DVTs. P:   Will add DVT prophylaxis dose lovenox 8/17 >> if no issues with bleeding, then transition to full dose lovenox on 8/18 Hold outpt xarelto until able to take oral medications  INFECTIOUS A: Peritonitis due to perforation of large bowel P:   Day 2 zosyn, started 8/16   Blood 8/16 >> Urine 8/16 >>  ENDOCRINE A: No acute issues. P:   Monitor blood sugar on BMET  NEUROLOGIC A: Post-op pain control. Hx of seizure disorder, depression, neuropathy. P:   PRN morphine Continue keppra, dilantin >> use IV for now, and will ask pharmacy to assist with dosing regimen Hold outpt prozac, neurontin  D/w Dr. Georgette Dover   CC time 40 minutes.  Chesley Mires, MD [facility] Pulmonary/Critical Care 12/26/2013, 9:20 AM Pager:  [phone] After 3pm call: [phone]

## 2013-12-26 NOTE — Progress Notes (Signed)
[facility] NOTE  Pharmacy Consult for Phenytoin and Lovenox Indication: seizure disorder; history of DVTs   Allergies  Allergen Reactions  . Penicillins     Mother and brother have had severe allergic reactions, no personal reaction    Patient Measurements: Height: 6\' 4"  (193 cm) Weight: 290 lb 6 oz (131.713 kg) IBW/kg (Calculated) : 86.8  Vital Signs: Temp: 98.2 F (36.8 C) (08/17 0750) Temp src: Axillary (08/17 0750) BP: 122/58 mmHg (08/17 0900) Pulse Rate: 80 (08/17 0930) Intake/Output from previous day: 08/16 0701 - 08/17 0700 In: 7434.4 [I.V.:5546.4; LKGMW:1027; IV OZDGUYQIH:474] Out: 1891 [QVZDG:3875; Stool:1; Blood:300] Intake/Output from this shift: Total I/O In: 100 [I.V.:100] Out: 0  Vent settings for last 24 hours: Vent Mode:  [-] PSV;CPAP FiO2 (%):  [40 %-100 %] 40 % Set Rate:  [14 bmp] 14 bmp Vt Set:  [650 mL] 650 mL PEEP:  [5 cmH20] 5 cmH20 Pressure Support:  [5 cmH20-8 cmH20] 5 cmH20 Plateau Pressure:  [17 cmH20] 17 cmH20  Labs:  Recent Labs  12/25/13 1304 12/25/13 1510 12/25/13 2000 12/26/13 0408  WBC 17.1*  --  12.1* 10.8*  HGB 16.5  --  12.3* 12.5*  HCT 47.9  --  36.3* 37.8*  PLT 201  --  187 188  APTT  --  44*  --   --   INR  --  1.87*  --   --   CREATININE 0.73  --  0.55 0.55  ALBUMIN 2.8*  --   --   --   PROT 6.7  --   --   --   AST 17  --   --   --   ALT 20  --   --   --   ALKPHOS 148*  --   --   --   BILITOT 0.5  --   --   --    Estimated Creatinine Clearance: 143.7 ml/min (by C-G formula based on Cr of 0.55).   Recent Labs  12/25/13 2033 12/26/13 0756  GLUCAP 117* 100*    Microbiology: Recent Results (from the past 720 hour(s))  MRSA PCR SCREENING     Status: None   Collection Time    12/26/13  3:20 AM      Result Value Ref Range Status   MRSA by PCR NEGATIVE  NEGATIVE Final   Comment:            The GeneXpert MRSA Assay (FDA     approved for NASAL specimens     only), is one component of a      comprehensive MRSA colonization     surveillance program. It is not     intended to diagnose MRSA     infection nor to guide or     monitor treatment for     MRSA infections.    Medications:  Scheduled:  . levETIRAcetam  1,500 mg Intravenous 3 times per day  . pantoprazole (PROTONIX) IV  40 mg Intravenous QHS  . [START ON 12/27/2013] phenytoin (DILANTIN) IV  100 mg Intravenous 4 times per day on Sun Tue Thu Sat  . phenytoin (DILANTIN) IV  100 mg Intravenous 3 times per day on Mon Wed Fri  . piperacillin-tazobactam (ZOSYN)  IV  3.375 g Intravenous Q8H  . sodium chloride  10-40 mL Intracatheter Q12H  . sodium chloride  3 mL Intravenous Q12H    Assessment: 63 year old male admitted with a perforated viscus now s/p ex-lap with ileostomy.  He has a history of seizures that are managed with Keppra and Phenytoin.  He is currently NPO and cannot receive his medications by mouth.  His phenytoin level, corrected for a low albumin, is 8.5 which is slightly subtherapeutic. However, I expect it will rise a little since he will be receiving his phenytoin IV instead of PO.  Given he has had no seizures I will not change his complicated regimen at this time.  He also has a history of recurrent DVTs and takes Xarelto.  His Xarelto has been held for surgery, and he is to start anticoagulation with Lovenox.  A tiered approach is planned, with starting with a VTE prophylaxis dose today and potentially increasing to a therapeutic dose on 8/18 if no bleeding complications.  His BMI is 35 so his VTE prophylaxis dose will be weight-based.  Goal of Therapy:  Phenytoin total level 10-20 mcg/ml  Plan:  Continue Phenytoin at his home regimen, but IV instead of PO. -100mg  IV q6h on Tuesdays, Thursdays, Saturdays, and Sundays -100mg  IV q8h on Mondays, Wednesdays, and Fridays Check Phenytoin level in 5-7 days, or sooner if seizure activity occurs Follow for ability to change antiepileptics to PO  Lovenox 65mg   SQ q24h (0.5 mg/kg/day for BMI >30) Follow for bleeding complications Follow for transition to full anticoagulation with Lovenox, and eventual transition back to [place], Pharm.D., BCPS, AAHIVP Clinical Pharmacist Phone: [phone] or [phone] 12/26/2013, 9:58 AM

## 2013-12-26 NOTE — Procedures (Signed)
Extubation Procedure Note  Patient Details:   Name: RAJINDER MESICK DOB: Nov 27, 1950 MRN: [id number]   Airway Documentation:     Evaluation  O2 sats: stable throughout Complications: No apparent complications Patient did tolerate procedure well. Bilateral Breath Sounds: Diminished Suctioning: Airway Yes Patient tolerated wean. MD ordered to extubate. Positive for cuff leak. Patient extubated to a 3 Lpm nasal cannula. No signs of dyspnea or stridor. Patient instructed on the Incentive Spirometer and achieved 1250 mL five times, with good patient effort. Patient resting comfortably. RN at bedside.  Myrtie Neither 12/26/2013, 9:40 AM

## 2013-12-26 NOTE — Progress Notes (Signed)
1 Day Post-Op  Subjective: Patient remains intubated, but awake and following commands Indicates tenderness in abdomen  Objective: Vital signs in last 24 hours: Temp:  [98.2 F (36.8 C)-100.8 F (38.2 C)] 98.2 F (36.8 C) (08/17 0750) Pulse Rate:  [70-108] 77 (08/17 0900) Resp:  [11-28] 17 (08/17 0900) BP: (94-151)/(50-89) 122/58 mmHg (08/17 0900) SpO2:  [91 %-100 %] 100 % (08/17 0900) Arterial Line BP: (110-200)/(49-79) 156/61 mmHg (08/17 0900) FiO2 (%):  [40 %-100 %] 40 % (08/17 0800) Weight:  [290 lb 6 oz (131.713 kg)] 290 lb 6 oz (131.713 kg) (08/16 1606) Last BM Date: 12/26/13  Intake/Output from previous day: 08/16 0701 - 08/17 0700 In: 7434.4 [I.V.:5546.4; OACZY:6063; IV KZSWFUXNA:355] Out: 1891 [DDUKG:2542; Stool:1; Blood:300] Intake/Output this shift: Total I/O In: 100 [I.V.:100] Out: 0   General appearance: alert, cooperative and no distress Resp: clear to auscultation bilaterally Cardio: regular rate and rhythm, S1, S2 normal, no murmur, click, rub or gallop GI: hypoactive bowel sounds; soft, incisional tenderness Ileostomy - already with some output; pink viable Mucus fistula - pink Midline wound - clean Lab Results:   Recent Labs  12/25/13 2000 12/26/13 0408  WBC 12.1* 10.8*  HGB 12.3* 12.5*  HCT 36.3* 37.8*  PLT 187 188   BMET  Recent Labs  12/25/13 2000 12/26/13 0408  NA 136* 137  K 3.2* 3.7  CL 100 103  CO2 25 26  GLUCOSE 124* 106*  BUN 9 9  CREATININE 0.55 0.55  CALCIUM 7.5* 7.7*   PT/INR  Recent Labs  12/25/13 1510  LABPROT 21.5*  INR 1.87*   ABG  Recent Labs  12/25/13 1958 12/25/13 2221  PHART 7.428 7.438  HCO3 26.7* 28.0*    Studies/Results: Dg Chest Port 1 View  12/26/2013   CLINICAL DATA:  Hypoxia  EXAM: PORTABLE CHEST - 1 VIEW  COMPARISON:  December 25, 2013  FINDINGS: Endotracheal tube tip is 4.1 cm above the carina. Central catheter tip is in the superior vena cava. Nasogastric tube tip and side port are below  the diaphragm. No pneumothorax. There is a right effusion with right base consolidation. Left lung is clear. Heart is upper normal in size with pulmonary vascularity within normal limits.  IMPRESSION: Tube and catheter positions as described without pneumothorax. Right base infiltrate with small right effusion. Left lung clear.   Electronically Signed   By: Lowella Grip M.D.   On: 12/26/2013 07:15   Dg Abd Acute W/chest  12/25/2013   CLINICAL DATA:  Abdominal pain  EXAM: ACUTE ABDOMEN SERIES (ABDOMEN 2 VIEW & CHEST 1 VIEW)  COMPARISON:  None.  FINDINGS: There is a large amount of free intraperitoneal gas. Distended loops of small and large bowel are scattered across the abdomen.  Low lung volumes.  Normal heart size.  IMPRESSION: Large amount of free intraperitoneal gas worrisome for ruptured bowel. Critical Value/emergent results were called by telephone at the time of interpretation on 12/25/2013 at 2:21 pm to Dr. Alvina Chou , who verbally acknowledged these results.   Electronically Signed   By: Maryclare Bean M.D.   On: 12/25/2013 14:21    Anti-infectives: Anti-infectives   Start     Dose/Rate Route Frequency Ordered Stop   12/25/13 2200  piperacillin-tazobactam (ZOSYN) IVPB 3.375 g     3.375 g 12.5 mL/hr over 240 Minutes Intravenous Every 8 hours 12/25/13 1521     12/25/13 1500  vancomycin (VANCOCIN) 1,250 mg in sodium chloride 0.9 % 250 mL IVPB  Status:  Discontinued  1,250 mg 166.7 mL/hr over 90 Minutes Intravenous  Once 12/25/13 1418 12/25/13 1914   12/25/13 1430  piperacillin-tazobactam (ZOSYN) IVPB 3.375 g     3.375 g 100 mL/hr over 30 Minutes Intravenous  Once 12/25/13 1418 12/25/13 1502      Assessment/Plan: s/p Procedure(s): EXPLORATORY LAPAROTOMY/Transverse and right coloectomy/ end  ileostomy and mucus  fistula (N/A) s/p partial colectomy with ileostomy/ MF Improving quickly - extubation per CCM May start sips of liquids Dressing changes IV abx May start  prophylactic heparin today, therapeutic tomorrow.   LOS: 1 day    Bruce Graver K. 12/26/2013

## 2013-12-26 NOTE — Progress Notes (Signed)
Utilization Review Completed.  

## 2013-12-27 DIAGNOSIS — E46 Unspecified protein-calorie malnutrition: Secondary | ICD-10-CM

## 2013-12-27 LAB — POCT I-STAT 7, (LYTES, BLD GAS, ICA,H+H)
Acid-Base Excess: 3 mmol/L — ABNORMAL HIGH (ref 0.0–2.0)
Bicarbonate: 28.2 mEq/L — ABNORMAL HIGH (ref 20.0–24.0)
CALCIUM ION: 1.09 mmol/L — AB (ref 1.13–1.30)
HCT: 37 % — ABNORMAL LOW (ref 39.0–52.0)
Hemoglobin: 12.6 g/dL — ABNORMAL LOW (ref 13.0–17.0)
O2 Saturation: 100 %
PCO2 ART: 44.8 mmHg (ref 35.0–45.0)
PO2 ART: 188 mmHg — AB (ref 80.0–100.0)
Potassium: 2.9 mEq/L — CL (ref 3.7–5.3)
Sodium: 136 mEq/L — ABNORMAL LOW (ref 137–147)
TCO2: 30 mmol/L (ref 0–100)
pH, Arterial: 7.411 (ref 7.350–7.450)

## 2013-12-27 LAB — URINE CULTURE
Colony Count: NO GROWTH
Culture: NO GROWTH

## 2013-12-27 LAB — CBC
HEMATOCRIT: 34.9 % — AB (ref 39.0–52.0)
HEMOGLOBIN: 11.8 g/dL — AB (ref 13.0–17.0)
MCH: 31.1 pg (ref 26.0–34.0)
MCHC: 33.8 g/dL (ref 30.0–36.0)
MCV: 91.8 fL (ref 78.0–100.0)
Platelets: 196 10*3/uL (ref 150–400)
RBC: 3.8 MIL/uL — ABNORMAL LOW (ref 4.22–5.81)
RDW: 12.7 % (ref 11.5–15.5)
WBC: 8.1 10*3/uL (ref 4.0–10.5)

## 2013-12-27 LAB — BASIC METABOLIC PANEL
ANION GAP: 8 (ref 5–15)
BUN: 6 mg/dL (ref 6–23)
CHLORIDE: 104 meq/L (ref 96–112)
CO2: 27 meq/L (ref 19–32)
CREATININE: 0.48 mg/dL — AB (ref 0.50–1.35)
Calcium: 7.5 mg/dL — ABNORMAL LOW (ref 8.4–10.5)
GFR calc Af Amer: >90 mL/min (ref >90)
GFR calc non Af Amer: >90 mL/min (ref >90)
GLUCOSE: 114 mg/dL — AB (ref 70–99)
Potassium: 3.4 mEq/L — ABNORMAL LOW (ref 3.7–5.3)
Sodium: 139 mEq/L (ref 137–147)

## 2013-12-27 MED ORDER — SILVER NITRATE-POT NITRATE 75-25 % EX MISC
1.0000 | Freq: Once | CUTANEOUS | Status: AC
  Administered 2013-12-27: 1 via TOPICAL
  Filled 2013-12-27 (×2): qty 1

## 2013-12-27 MED ORDER — HYDROMORPHONE HCL PF 1 MG/ML IJ SOLN
1.0000 mg | INTRAMUSCULAR | Status: DC | PRN
  Administered 2013-12-27 – 2014-01-02 (×27): 1 mg via INTRAVENOUS
  Filled 2013-12-27 (×28): qty 1

## 2013-12-27 MED ORDER — KCL IN DEXTROSE-NACL 20-5-0.45 MEQ/L-%-% IV SOLN
INTRAVENOUS | Status: DC
  Administered 2013-12-27: 75 mL/h via INTRAVENOUS
  Administered 2013-12-27 – 2013-12-29 (×3): via INTRAVENOUS
  Filled 2013-12-27 (×11): qty 1000

## 2013-12-27 MED ORDER — ENOXAPARIN SODIUM 150 MG/ML ~~LOC~~ SOLN
130.0000 mg | Freq: Two times a day (BID) | SUBCUTANEOUS | Status: DC
  Administered 2013-12-27 – 2013-12-30 (×6): 130 mg via SUBCUTANEOUS
  Administered 2013-12-30: 17:00:00 via SUBCUTANEOUS
  Administered 2013-12-31 – 2014-01-03 (×7): 130 mg via SUBCUTANEOUS
  Filled 2013-12-27 (×18): qty 1

## 2013-12-27 NOTE — Progress Notes (Signed)
2 Days Post-Op  Subjective: Patient extubated, awake, alert Having some pain issues Ostomy functioning  Objective: Vital signs in last 24 hours: Temp:  [97.3 F (36.3 C)-98.7 F (37.1 C)] 97.3 F (36.3 C) (08/18 0754) Pulse Rate:  [78-91] 80 (08/18 0900) Resp:  [6-20] 15 (08/18 0900) BP: (115-141)/(52-74) 137/74 mmHg (08/18 0900) SpO2:  [93 %-100 %] 97 % (08/18 0900) Arterial Line BP: (87-166)/(48-56) 126/56 mmHg (08/18 0600) Last BM Date: 12/26/13  Intake/Output from previous day: 08/17 0701 - 08/18 0700 In: 2928.3 [I.V.:2438.3; NG/GT:40; IV Piggyback:450] Out: 6767 [Urine:820; Emesis/NG output:200; MCNOB:096] Intake/Output this shift: Total I/O In: 195 [I.V.:175; NG/GT:20] Out: 150 [Urine:150]  General appearance: alert, cooperative and no distress Resp: clear to auscultation bilaterally Cardio: regular rate and rhythm, S1, S2 normal, no murmur, click, rub or gallop GI: obese, soft, pink functioning ileostomy with brown output; purple mucus fistula - minimal output Wound - small bleeding vessel at lower edge of incision - silver nitrate applied.  Lab Results:   Recent Labs  12/26/13 0408 12/27/13 0355  WBC 10.8* 8.1  HGB 12.5* 11.8*  HCT 37.8* 34.9*  PLT 188 196   BMET  Recent Labs  12/26/13 0408 12/27/13 0355  NA 137 139  K 3.7 3.4*  CL 103 104  CO2 26 27  GLUCOSE 106* 114*  BUN 9 6  CREATININE 0.55 0.48*  CALCIUM 7.7* 7.5*   PT/INR  Recent Labs  12/25/13 1510  LABPROT 21.5*  INR 1.87*   ABG  Recent Labs  12/25/13 1958 12/25/13 2221  PHART 7.428 7.438  HCO3 26.7* 28.0*    Studies/Results: Dg Chest Port 1 View  12/26/2013   CLINICAL DATA:  Hypoxia  EXAM: PORTABLE CHEST - 1 VIEW  COMPARISON:  December 25, 2013  FINDINGS: Endotracheal tube tip is 4.1 cm above the carina. Central catheter tip is in the superior vena cava. Nasogastric tube tip and side port are below the diaphragm. No pneumothorax. There is a right effusion with right base  consolidation. Left lung is clear. Heart is upper normal in size with pulmonary vascularity within normal limits.  IMPRESSION: Tube and catheter positions as described without pneumothorax. Right base infiltrate with small right effusion. Left lung clear.   Electronically Signed   By: Lowella Grip M.D.   On: 12/26/2013 07:15   Dg Abd Acute W/chest  12/25/2013   CLINICAL DATA:  Abdominal pain  EXAM: ACUTE ABDOMEN SERIES (ABDOMEN 2 VIEW & CHEST 1 VIEW)  COMPARISON:  None.  FINDINGS: There is a large amount of free intraperitoneal gas. Distended loops of small and large bowel are scattered across the abdomen.  Low lung volumes.  Normal heart size.  IMPRESSION: Large amount of free intraperitoneal gas worrisome for ruptured bowel. Critical Value/emergent results were called by telephone at the time of interpretation on 12/25/2013 at 2:21 pm to Dr. Alvina Chou , who verbally acknowledged these results.   Electronically Signed   By: Maryclare Bean M.D.   On: 12/25/2013 14:21    Anti-infectives: Anti-infectives   Start     Dose/Rate Route Frequency Ordered Stop   12/25/13 2200  piperacillin-tazobactam (ZOSYN) IVPB 3.375 g     3.375 g 12.5 mL/hr over 240 Minutes Intravenous Every 8 hours 12/25/13 1521     12/25/13 1500  vancomycin (VANCOCIN) 1,250 mg in sodium chloride 0.9 % 250 mL IVPB  Status:  Discontinued     1,250 mg 166.7 mL/hr over 90 Minutes Intravenous  Once 12/25/13 1418 12/25/13 1914  12/25/13 1430  piperacillin-tazobactam (ZOSYN) IVPB 3.375 g     3.375 g 100 mL/hr over 30 Minutes Intravenous  Once 12/25/13 1418 12/25/13 1502      Assessment/Plan: s/p Procedure(s): EXPLORATORY LAPAROTOMY/Transverse and right coloectomy/ end  ileostomy and mucus  fistula (N/A) Transfer to step-down unit Remove NG tube Clear liquids  PT/OT - patient was fairly debilitated prior to surgery   LOS: 2 days    Doug Bucklin K. 12/27/2013

## 2013-12-27 NOTE — Progress Notes (Signed)
OT Cancellation Note  Patient Details Name: [name] MRN: [id number] DOB: 08-05-1950   Cancelled Treatment:    Reason Eval/Treat Not Completed: Fatigue/lethargy limiting ability to participate - pt just finished with PT.  Will reattempt.   Darlina Rumpf Keller, OTR/L [phone]  12/27/2013, 2:24 PM

## 2013-12-27 NOTE — Progress Notes (Signed)
PULMONARY  / CRITICAL CARE MEDICINE CONSULTATION   Name: [name] MRN: [id number] DOB: Jun 10, 1950    ADMISSION DATE:  12/25/2013 CONSULTATION DATE: 12/25/2013 REQUESTING CLINICIAN: Md Ccs, MD  CHIEF COMPLAINT:  Intubated  BRIEF PATIENT DESCRIPTION:  63 y/o man with seizure d/o and perforated viscus s/p ex lap w/ ileostomy.  Remained on vent post-op and PCCM consulted to assist with vent management.  SIGNIFICANT EVENTS: 8/16 Admit, emergent laparotomy >> ProximalTransverse and right coloectomy/ end ileostomy and mucus fistula.  STUDIES:   SUBJECTIVE:  Abdominal pain better.  Denies chest pain, dyspnea.    VITAL SIGNS: Temp:  [97.3 F (36.3 C)-98.7 F (37.1 C)] 97.3 F (36.3 C) (08/18 0754) Pulse Rate:  [77-91] 84 (08/18 0700) Resp:  [6-20] 17 (08/18 0700) BP: (118-141)/(52-64) 118/52 mmHg (08/18 0700) SpO2:  [93 %-100 %] 93 % (08/18 0700) Arterial Line BP: (87-173)/(48-65) 126/56 mmHg (08/18 0600) INTAKE / OUTPUT: Intake/Output     08/17 0701 - 08/18 0700 08/18 0701 - 08/19 0700   I.V. (mL/kg) 2338.3 (17.8)    Blood     NG/GT 40    IV Piggyback 450    Total Intake(mL/kg) 2828.3 (21.5)    Urine (mL/kg/hr) 820 (0.3)    Emesis/NG output 200 (0.1)    Stool 200 (0.1)    Blood     Total Output 1220     Net +1608.3            PHYSICAL EXAMINATION: General: no distress Neuro: follows commands, moves extremities HEENT: NG tube in place Cardiovascular: regular Lungs: no wheeze Abdomen: Ileostomy/colostomy in place, wound dressing clean Musculoskeletal: no edema Skin: no rashes  LABS:  CBC  Recent Labs Lab 12/25/13 2000 12/26/13 0408 12/27/13 0355  WBC 12.1* 10.8* 8.1  HGB 12.3* 12.5* 11.8*  HCT 36.3* 37.8* 34.9*  PLT 187 188 196   Coag's  Recent Labs Lab 12/25/13 1510  APTT 44*  INR 1.87*   BMET  Recent Labs Lab 12/25/13 2000 12/26/13 0408 12/27/13 0355  NA 136* 137 139  K 3.2* 3.7 3.4*  CL 100 103 104  CO2 25 26 27   BUN 9 9 6    CREATININE 0.55 0.55 0.48*  GLUCOSE 124* 106* 114*   Electrolytes  Recent Labs Lab 12/25/13 2000 12/26/13 0408 12/27/13 0355  CALCIUM 7.5* 7.7* 7.5*   Sepsis Markers  Recent Labs Lab 12/25/13 1323  LATICACIDVEN 1.63   ABG  Recent Labs Lab 12/25/13 1958 12/25/13 2221  PHART 7.428 7.438  PCO2ART 41.1 41.4  PO2ART 343.0* 109.0*   Liver Enzymes  Recent Labs Lab 12/25/13 1304  AST 17  ALT 20  ALKPHOS 148*  BILITOT 0.5  ALBUMIN 2.8*   Glucose  Recent Labs Lab 12/25/13 2033 12/26/13 0756 12/26/13 1206 12/26/13 2326  GLUCAP 117* 100* 103* 110*    Imaging Dg Chest Port 1 View  12/26/2013   CLINICAL DATA:  Hypoxia  EXAM: PORTABLE CHEST - 1 VIEW  COMPARISON:  December 25, 2013  FINDINGS: Endotracheal tube tip is 4.1 cm above the carina. Central catheter tip is in the superior vena cava. Nasogastric tube tip and side port are below the diaphragm. No pneumothorax. There is a right effusion with right base consolidation. Left lung is clear. Heart is upper normal in size with pulmonary vascularity within normal limits.  IMPRESSION: Tube and catheter positions as described without pneumothorax. Right base infiltrate with small right effusion. Left lung clear.   Electronically Signed   By: Lowella Grip M.D.  On: 12/26/2013 07:15   Dg Abd Acute W/chest  12/25/2013   CLINICAL DATA:  Abdominal pain  EXAM: ACUTE ABDOMEN SERIES (ABDOMEN 2 VIEW & CHEST 1 VIEW)  COMPARISON:  None.  FINDINGS: There is a large amount of free intraperitoneal gas. Distended loops of small and large bowel are scattered across the abdomen.  Low lung volumes.  Normal heart size.  IMPRESSION: Large amount of free intraperitoneal gas worrisome for ruptured bowel. Critical Value/emergent results were called by telephone at the time of interpretation on 12/25/2013 at 2:21 pm to Dr. Alvina Chou , who verbally acknowledged these results.   Electronically Signed   By: Maryclare Bean M.D.   On: 12/25/2013  14:21    ASSESSMENT / PLAN: GASTROINTESTINAL A: Perforation of transverse colon, s/p ex-lap w/ ileostomy. P:   Post-op care, nutrition per CCS  PULMONARY ETT 8/16 >> 8/17  A: Remained on vent post-op. Atelectasis. P:   Bronchial hygiene  CARDIOVASCULAR Rt IJ CVL 8/16 >>  A: Hx of CAD, PVD, hyperlipidemia. P:   Monitor hemodynamics Hold outpt pravachol until able to take oral meds  RENAL A: Hypokalemia. Hx of BPH. P:   Monitor renal fx, urine outpt F/u and replace electrolytes as needed Change IV fluid to D5 1/2 NS with 20 meq KCL on 8/18 Hold outpt flomax until able to take oral meds  HEMATOLOGIC A: Hx of recurrent DVTs. P:   DVT dose of lovenox started 8/17 >> will need to d/w CCS if okay to increase to full dose lovenox Hold outpt xarelto until able to take oral medications  INFECTIOUS A: Peritonitis due to perforation of large bowel P:   Day 3 zosyn, started 8/16   Blood 8/16 >>  ENDOCRINE A: No acute issues. P:   Monitor blood sugar on BMET  NEUROLOGIC A: Post-op pain control. Hx of seizure disorder, depression, neuropathy. P:   PRN morphine Continue keppra, dilantin >> use IV for now, and pharmacy to assist with dosing regimen Hold outpt prozac, neurontin until able to take oral meds  Summary: Hemodynamics and respiratory status improved.  Will defer to CCS about mobilizing pt and advancing diet.  Likely can transfer out of ICU >> defer to CCS.  Chesley Mires, MD [facility] Pulmonary/Critical Care 12/27/2013, 8:19 AM Pager:  [phone] After 3pm call: [phone]

## 2013-12-27 NOTE — Evaluation (Signed)
Physical Therapy Evaluation Patient Details Name: [name] MRN: [id number] DOB: 1950-09-01 Today's Date: 12/27/2013   History of Present Illness  Pt adm with perforated colon and underwent EXPLORATORY LAPAROTOMY/ ProximalTransverse and right coloectomy/ end  ileostomy and mucus  fistula. PMH - Lt ankle fx 08/2013 with stay at SNF until admission, rt ankle fx, morbid obesity, PVD, Seizure, DVT  Clinical Impression  Pt admitted with above. Pt currently with functional limitations due to the deficits listed below (see PT Problem List).  Pt will benefit from skilled PT to increase their independence and safety with mobility to allow discharge back to SNF for further therapy. Expect progress will be slow.      Follow Up Recommendations SNF    Equipment Recommendations  None recommended by PT    Recommendations for Other Services       Precautions / Restrictions Precautions Precautions: Fall      Mobility  Bed Mobility Overal bed mobility: Needs Assistance Bed Mobility: Supine to Sit;Sit to Supine     Supine to sit: Total assist (+3) Sit to supine: Total assist (+3)   General bed mobility comments: Pt pushing heavily posterior making sitting very difficult. Assist to move legs and trunk.  Transfers                    Ambulation/Gait                Stairs            Wheelchair Mobility    Modified Rankin (Stroke Patients Only)       Balance Overall balance assessment: Needs assistance Sitting-balance support: Feet unsupported;Bilateral upper extremity supported Sitting balance-Leahy Scale: Zero Sitting balance - Comments: Pt required 3 people to keep sitting EOB x 1 minute due to pushing heavily posteriorly. Postural control: Posterior lean                                   Pertinent Vitals/Pain Pain Assessment: Faces Faces Pain Scale: Hurts whole lot Pain Location: abdomen Pain Intervention(s): Premedicated before  session;Limited activity within patient's tolerance;Monitored during session    [place] expects to be discharged to:: Skilled nursing facility                 Additional Comments: Pt has been at SNF since ankle fx in April    Prior Function Level of Independence: Needs assistance   Gait / Transfers Assistance Needed: Pt amb short distances with walker with therapy at SNF           Hand Dominance        Extremity/Trunk Assessment   Upper Extremity Assessment: Defer to OT evaluation           Lower Extremity Assessment: Generalized weakness         Communication   Communication: No difficulties  Cognition Arousal/Alertness: Awake/alert Behavior During Therapy: WFL for tasks assessed/performed Overall Cognitive Status: Within Functional Limits for tasks assessed                      General Comments      Exercises        Assessment/Plan    PT Assessment Patient needs continued PT services  PT Diagnosis Difficulty walking;Generalized weakness;Acute pain   PT Problem List Decreased strength;Decreased activity tolerance;Decreased balance;Decreased mobility;Obesity;Pain  PT Treatment Interventions DME instruction;Gait training;Balance training;Functional mobility training;Patient/family education;Therapeutic  activities;Therapeutic exercise   PT Goals (Current goals can be found in the Care Plan section) Acute Rehab PT Goals Patient Stated Goal: Not stated PT Goal Formulation: With patient Time For Goal Achievement: 01/10/14 Potential to Achieve Goals: Fair    Frequency Min 2X/week   Barriers to discharge        Co-evaluation               End of Session Equipment Utilized During Treatment: Oxygen Activity Tolerance: Patient limited by pain Patient left: in bed;with call bell/phone within reach Nurse Communication: Mobility status;Need for lift equipment         Time: 1115-5208 PT Time Calculation (min):  13 min   Charges:   PT Evaluation $Initial PT Evaluation Tier I: 1 Procedure PT Treatments $Therapeutic Activity: 8-22 mins   PT G Codes:          Dolan Xia 01/01/2014, 3:23 PM  [facility] PT [phone]

## 2013-12-27 NOTE — Progress Notes (Signed)
Called SNF [facility]) to verify if [name] has had a Pneumonia Vaccine within the last five years, per nursing staff at [facility], patient was immunized on 02/09/2013 with the vaccine.

## 2013-12-27 NOTE — Progress Notes (Signed)
ANTICOAGULATION CONSULT NOTE - Follow Up Consult  Pharmacy Consult for Lovenox Indication: h/o DVTs  Allergies  Allergen Reactions  . Penicillins     Mother and brother have had severe allergic reactions, no personal reaction    Patient Measurements: Height: 6\' 4"  (193 cm) Weight: 290 lb 6 oz (131.713 kg) IBW/kg (Calculated) : 86.8  Vital Signs: Temp: 98 F (36.7 C) (08/18 1203) Temp src: Oral (08/18 1203) BP: 137/74 mmHg (08/18 0900) Pulse Rate: 80 (08/18 0900)  Labs:  Recent Labs  12/25/13 1304 12/25/13 1510  12/25/13 2000 12/26/13 0408 12/27/13 0355  HGB 16.5  --   < > 12.3* 12.5* 11.8*  HCT 47.9  --   < > 36.3* 37.8* 34.9*  PLT 201  --   --  187 188 196  APTT  --  44*  --   --   --   --   LABPROT  --  21.5*  --   --   --   --   INR  --  1.87*  --   --   --   --   CREATININE 0.73  --   --  0.55 0.55 0.48*  < > = values in this interval not displayed.  Estimated Creatinine Clearance: 143.7 ml/min (by C-G formula based on Cr of 0.48).  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 On Xarelto 20mg  daily PTA - held for surgery on 8/16. VTE prophylactic Lovenox dose started 8/17 - 65 mg SQ daily - pt received dose today at 0930. Okay with CCM and CCS to increase to full therapeutic dose today. Plan back to Xarelto once pt can swallow. CBC stable. No bleeding noted.  SCr remains stable with CrCl > 100 ml/min.  Goal of Therapy:  Anti-Xa level 0.6-1 units/ml 4hrs after LMWH dose given Monitor platelets by anticoagulation protocol: Yes   Plan:  1) Lovenox 130mg  SQ q12h. Next dose today at 1800 2) Will f/u renal function, CBC q72h, s/s bleeding 3) F/u ability to restart home [place], PharmD, BCPS Clinical pharmacist, pager [phone] 12/27/2013,2:16 PM

## 2013-12-28 DIAGNOSIS — K639 Disease of intestine, unspecified: Secondary | ICD-10-CM

## 2013-12-28 LAB — TYPE AND SCREEN
ABO/RH(D): A POS
ANTIBODY SCREEN: NEGATIVE
UNIT DIVISION: 0
UNIT DIVISION: 0
Unit division: 0
Unit division: 0

## 2013-12-28 LAB — GLUCOSE, CAPILLARY: GLUCOSE-CAPILLARY: 101 mg/dL — AB (ref 70–99)

## 2013-12-28 MED ORDER — BOOST / RESOURCE BREEZE PO LIQD
1.0000 | Freq: Three times a day (TID) | ORAL | Status: DC
  Administered 2013-12-28 – 2014-01-01 (×10): 1 via ORAL
  Administered 2014-01-01: 10:00:00 via ORAL
  Administered 2014-01-02 – 2014-01-03 (×4): 1 via ORAL

## 2013-12-28 MED ORDER — PHENYTOIN SODIUM 50 MG/ML IJ SOLN
100.0000 mg | INTRAMUSCULAR | Status: DC
Start: 2013-12-30 — End: 2014-01-02
  Administered 2013-12-30 – 2014-01-02 (×5): 100 mg via INTRAVENOUS
  Filled 2013-12-28 (×6): qty 2

## 2013-12-28 NOTE — Consult Note (Signed)
WOC ostomy consult note Stoma type/location:  Consult requested for new ileostomy from 8/16.  Pt has ileostomy to RLQ and mucous fistula to LLQ Stomal assessment/size: Right ileostomy stoma red and viable, slightly above skin level, 1 3/4 inches.   Left mucous fistula red and viable,  above skin level, 1 1/2 inches.  Peristomal assessment: Skin intact surrounding both stoma sites. Output  Mucous fistula with small amt tan drainage; no stool or flatus.  Ileostomy with 50cc liquid brown stool. Ostomy pouching: Applied 2 piece pouch to each stoma. Barrier ring applied to ileostomy stoma to maintain seal.   Education provided: Pt is obtunded and did not partipate in pouching activities or ask questions. No family members at bedside during teaching sessions. Supplies ordered to bedside for staff nurse use. Will continue to perform educational sessions when pt stable and out of ICU. Julien Girt MSN, RN, [place], [place], [place]

## 2013-12-28 NOTE — Progress Notes (Signed)
PULMONARY  / CRITICAL CARE MEDICINE CONSULTATION   Name: [name] MRN: [id number] DOB: 03/15/1951    ADMISSION DATE:  12/25/2013 CONSULTATION DATE: 12/25/2013 REQUESTING CLINICIAN: Md Ccs, MD  CHIEF COMPLAINT:  Intubated  BRIEF PATIENT DESCRIPTION:  63 y/o man with seizure d/o and perforated viscus s/p ex lap w/ ileostomy.  Remained on vent post-op and PCCM consulted to assist with vent management.  SIGNIFICANT EVENTS: 8/16 Admit, emergent laparotomy >> ProximalTransverse and right coloectomy/ end ileostomy and mucus fistula.  STUDIES:   SUBJECTIVE:  Denies sore throat, dyspnea, chest pain.   VITAL SIGNS: Temp:  [97.8 F (36.6 C)-98.3 F (36.8 C)] 97.8 F (36.6 C) (08/19 0811) Pulse Rate:  [68-93] 77 (08/19 0800) Resp:  [14-25] 20 (08/19 0800) BP: (107-145)/(58-81) 125/64 mmHg (08/19 0800) SpO2:  [91 %-100 %] 97 % (08/19 0800) INTAKE / OUTPUT: Intake/Output     08/18 0701 - 08/19 0700 08/19 0701 - 08/20 0700   P.O. 150    I.V. (mL/kg) 1828 (13.9) 75 (0.6)   NG/GT 20    IV Piggyback 450    Total Intake(mL/kg) 2448 (18.6) 75 (0.6)   Urine (mL/kg/hr) 1345 (0.4) 225 (0.5)   Emesis/NG output     Stool 190 (0.1)    Total Output 1535 225   Net +913 -150          PHYSICAL EXAMINATION: General: no distress Neuro: follows commands, moves extremities HEENT: no sinus tenderness Cardiovascular: regular Lungs: no wheeze Abdomen: Ileostomy/colostomy in place, wound dressing clean Musculoskeletal: no edema Skin: no rashes  LABS:  CBC  Recent Labs Lab 12/25/13 2000 12/26/13 0408 12/27/13 0355  WBC 12.1* 10.8* 8.1  HGB 12.3* 12.5* 11.8*  HCT 36.3* 37.8* 34.9*  PLT 187 188 196   Coag's  Recent Labs Lab 12/25/13 1510  APTT 44*  INR 1.87*   BMET  Recent Labs Lab 12/25/13 2000 12/26/13 0408 12/27/13 0355  NA 136* 137 139  K 3.2* 3.7 3.4*  CL 100 103 104  CO2 25 26 27   BUN 9 9 6   CREATININE 0.55 0.55 0.48*  GLUCOSE 124* 106* 114*    Electrolytes  Recent Labs Lab 12/25/13 2000 12/26/13 0408 12/27/13 0355  CALCIUM 7.5* 7.7* 7.5*   Sepsis Markers  Recent Labs Lab 12/25/13 1323  LATICACIDVEN 1.63   ABG  Recent Labs Lab 12/25/13 1732 12/25/13 1958 12/25/13 2221  PHART 7.411 7.428 7.438  PCO2ART 44.8 41.1 41.4  PO2ART 188.0* 343.0* 109.0*   Liver Enzymes  Recent Labs Lab 12/25/13 1304  AST 17  ALT 20  ALKPHOS 148*  BILITOT 0.5  ALBUMIN 2.8*   Glucose  Recent Labs Lab 12/25/13 2033 12/26/13 0756 12/26/13 1206 12/26/13 2326  GLUCAP 117* 100* 103* 110*    Imaging No results found.  ASSESSMENT / PLAN: GASTROINTESTINAL A: Perforation of transverse colon, s/p ex-lap w/ ileostomy. P:   Post-op care, nutrition per CCS  PULMONARY ETT 8/16 >> 8/17  A: Remained on vent post-op. Atelectasis. P:   Bronchial hygiene  CARDIOVASCULAR Rt IJ CVL 8/16 >>  A: Hx of CAD, PVD, hyperlipidemia. P:   Monitor hemodynamics Hold outpt pravachol until able to take oral meds Defer when to d/c CVL to CCS >> might need CVL for TNA  RENAL A: Hypokalemia. Hx of BPH. P:   Monitor renal fx, urine outpt F/u and replace electrolytes as needed Changed IV fluid to D5 1/2 NS with 20 meq KCL on 8/18 Hold outpt flomax until able to take oral  meds  HEMATOLOGIC A: Hx of recurrent DVTs. P:   Changed to full dose lovenox started 8/18  Hold outpt xarelto until able to take oral medications  INFECTIOUS A: Peritonitis due to perforation of large bowel P:   Day 4 zosyn, started 8/16 per CCS  Blood 8/16 >>  ENDOCRINE A: No acute issues. P:   Monitor blood sugar on BMET  NEUROLOGIC A: Post-op pain control. Hx of seizure disorder, depression, neuropathy. P:   PRN morphine Continue keppra, dilantin >> use IV for now, and pharmacy to assist with dosing regimen Hold outpt prozac, neurontin until able to take oral meds  Summary: Stable for transfer out of ICU.  PCCM will sign off.  If  additional medical assistance needed, then recommend consulting Hospitalist team.  Chesley Mires, MD [place] 12/28/2013, 10:14 AM Pager:  [phone] After 3pm call: [phone]

## 2013-12-28 NOTE — Evaluation (Signed)
Occupational Therapy Evaluation Patient Details Name: [name] MRN: [id number] DOB: 26-Apr-1951 Today's Date: 12/28/2013    History of Present Illness Pt adm with perforated colon and underwent EXPLORATORY LAPAROTOMY/ ProximalTransverse and right coloectomy/ end  ileostomy and mucus  fistula. PMH - Lt ankle fx 08/2013 with stay at SNF until admission, rt ankle fx, morbid obesity, PVD, Seizure, DVT   Clinical Impression   .Pt was admitted with the above.  He has been at [facility] since 4/15 and per his report has been total A with all BADLs and using hoyer lift for transfers.   He required total A to roll Lt and Rt in bed with very poor effort from patient.   He will required continued OT at SNF to address the below listed deficits to allow him to regain max independence with BADLs.  Feel that all further OT needs can be addressed at SNF, will sign off.     Follow Up Recommendations  SNF    Equipment Recommendations  None recommended by OT    Recommendations for Other Services       Precautions / Restrictions Precautions Precautions: Fall Restrictions Weight Bearing Restrictions: Yes LLE Weight Bearing: Non weight bearing      Mobility Bed Mobility Overal bed mobility: Needs Assistance Bed Mobility: Rolling Rolling: Total assist         General bed mobility comments: Pt with poor effort.  Initiates, but then stops assisting and resists motion   Transfers                      Balance                                            ADL Overall ADL's : Needs assistance/impaired Eating/Feeding: Set up (clear liquids)   Grooming: Wash/dry hands;Wash/dry face;Set up;Bed level   Upper Body Bathing: Maximal assistance;Bed level   Lower Body Bathing: Total assistance;Bed level   Upper Body Dressing : Total assistance;Bed level   Lower Body Dressing: Total assistance;Bed level   Toilet Transfer: Total assistance   Toileting- Clothing Manipulation  and Hygiene: Total assistance;Bed level       Functional mobility during ADLs: Total assistance (bed mobility) General ADL Comments: Pt with very littel effort with bed level activity.  Cites elbow pain at IV site and that he has "bags" on his abdomen.  Pt will initiate activity, but then stops assisting or pulls agains therapist     Vision                     Perception     Praxis      Pertinent Vitals/Pain Pain Assessment: 0-10 Pain Score: 10-Worst pain ever Pain Location: abdomen Pain Descriptors / Indicators: Aching Pain Intervention(s): Patient requesting pain meds-RN notified;Repositioned     Hand Dominance     Extremity/Trunk Assessment Upper Extremity Assessment Upper Extremity Assessment: Generalized weakness   Lower Extremity Assessment Lower Extremity Assessment: Defer to PT evaluation       Communication Communication Communication: No difficulties   Cognition Arousal/Alertness: Awake/alert Behavior During Therapy: Flat affect Overall Cognitive Status: Within Functional Limits for tasks assessed                     General Comments       Exercises  Shoulder Instructions      Home Living Family/patient expects to be discharged to:: Skilled nursing facility                                 Additional Comments: Pt has been at SNF since ankle fx in April      Prior Functioning/Environment Level of Independence: Needs assistance  Gait / Transfers Assistance Needed: Pt reports to OT that he has not ambulated since ankle fracture.  He states he has only been lifted to commode with a hoyer, and stopped therapy soon after admission to SNF due to abdominal pain  ADL's / Homemaking Assistance Needed: total A since April.  Prior to April, he reports he was independent         OT Diagnosis: Generalized weakness;Acute pain   OT Problem List: Decreased strength;Decreased activity tolerance;Impaired balance (sitting  and/or standing);Decreased safety awareness;Decreased knowledge of use of DME or AE;Obesity;Pain   OT Treatment/Interventions:      OT Goals(Current goals can be found in the care plan section) Acute Rehab OT Goals OT Goal Formulation: With patient  OT Frequency:     Barriers to D/C:            Co-evaluation              End of Session Nurse Communication: Mobility status;Patient requests pain meds  Activity Tolerance: Patient tolerated treatment well Patient left: in bed;with call bell/phone within reach   Time: 1515-1533 OT Time Calculation (min): 18 min Charges:  OT General Charges $OT Visit: 1 Procedure OT Evaluation $Initial OT Evaluation Tier I: 1 Procedure OT Treatments $Therapeutic Activity: 8-22 mins G-Codes:    Cyrah Mclamb M 14-Jan-2014, 3:49 PM

## 2013-12-28 NOTE — Progress Notes (Signed)
3 Days Post-Op  Subjective: Patient with no appetite or desire to even drink liquids Still with some abdominal pain Ileostomy functioning well   Objective: Vital signs in last 24 hours: Temp:  [97.8 F (36.6 C)-98.3 F (36.8 C)] 97.8 F (36.6 C) (08/19 0811) Pulse Rate:  [68-93] 77 (08/19 0800) Resp:  [8-25] 20 (08/19 0800) BP: (107-145)/(58-81) 125/64 mmHg (08/19 0800) SpO2:  [91 %-100 %] 97 % (08/19 0800) Last BM Date: 12/27/13  Intake/Output from previous day: 08/18 0701 - 08/19 0700 In: 2448 [P.O.:150; I.V.:1828; NG/GT:20; IV Piggyback:450] Out: 1914 [Urine:1345; Stool:190] Intake/Output this shift: Total I/O In: 75 [I.V.:75] Out: 225 [Urine:225]  General appearance: alert, cooperative and no distress Resp: clear to auscultation bilaterally Cardio: regular rate and rhythm, S1, S2 normal, no murmur, click, rub or gallop GI: active bowel sounds; moderate tenderness diffusely around incision Wound - open clean, no granulation tissue  Lab Results:   Recent Labs  12/26/13 0408 12/27/13 0355  WBC 10.8* 8.1  HGB 12.5* 11.8*  HCT 37.8* 34.9*  PLT 188 196   BMET  Recent Labs  12/26/13 0408 12/27/13 0355  NA 137 139  K 3.7 3.4*  CL 103 104  CO2 26 27  GLUCOSE 106* 114*  BUN 9 6  CREATININE 0.55 0.48*  CALCIUM 7.7* 7.5*   PT/INR  Recent Labs  12/25/13 1510  LABPROT 21.5*  INR 1.87*   ABG  Recent Labs  12/25/13 1958 12/25/13 2221  PHART 7.428 7.438  HCO3 26.7* 28.0*    Studies/Results: No results found.  Anti-infectives: Anti-infectives   Start     Dose/Rate Route Frequency Ordered Stop   12/25/13 2200  piperacillin-tazobactam (ZOSYN) IVPB 3.375 g     3.375 g 12.5 mL/hr over 240 Minutes Intravenous Every 8 hours 12/25/13 1521     12/25/13 1500  vancomycin (VANCOCIN) 1,250 mg in sodium chloride 0.9 % 250 mL IVPB  Status:  Discontinued     1,250 mg 166.7 mL/hr over 90 Minutes Intravenous  Once 12/25/13 1418 12/25/13 1914   12/25/13 1430   piperacillin-tazobactam (ZOSYN) IVPB 3.375 g     3.375 g 100 mL/hr over 30 Minutes Intravenous  Once 12/25/13 1418 12/25/13 1630      Assessment/Plan: s/p Procedure(s): EXPLORATORY LAPAROTOMY/Transverse and right coloectomy/ end  ileostomy and mucus  fistula (N/A) Encourage PO intake - would like to hold off TNA since his bowels seem to be functioning.  may required NG tube with tube feeds if no improvement in his Po intake Step-down unit WOCN consult.    LOS: 3 days    Ayra Hodgdon K. 12/28/2013

## 2013-12-28 NOTE — Progress Notes (Addendum)
INITIAL NUTRITION ASSESSMENT  DOCUMENTATION CODES Per approved criteria  -Obesity Unspecified   INTERVENTION: Resource Breeze po TID, each supplement provides 250 kcal and 9 grams of protein If PO intake does not improve, recommend EN support initiation  RD to follow for nutrition care plan  NUTRITION DIAGNOSIS: Inadequate oral intake related to altered GI function as evidenced by PO intake 0%  Goal: Pt to meet >/= 90% of their estimated nutrition needs   Monitor:  PO & supplemental intake, nutrition support initiation, weight, labs, I/O's  Reason for Assessment: Malnutrition Screening Tool Report  63 y.o. male  Admitting Dx: perforated viscus  ASSESSMENT: Patient with PMH of CAD, depression, peripheral vascular disease, seizures, DVT and hyperplasia of prostate; presented with large and small bowel distention; work-up in ED (acute abdomen film) showed large amount of free intraperitoneal gas worrisome for ruptured bowel.  Patient s/p procedures 8/16: EXPLORATORY LAPAROTOMY PROXIMAL TRANSVERSE & RIGHT COLECTOMY END ILEOSTOMY & MUCUS FISTULA  Patient reports a poor appetite; + abdominal pain; very little (if anything) consumed from his lunch Clear Liquid tray; was consuming only clear liquids (ie broth, jello) for approximately 5 days PTA; he states he's lost ~ 23 lbs (from 313 lb to 290 lb) during this time frame, however, wt readings below do no reflect this; he is amenable to trying Resource Breeze oral nutrition supplement; RD to order.  No muscle or subcutaneous fat depletion noticed.  Height: Ht Readings from Last 1 Encounters:  12/25/13 6\' 4"  (1.93 m)    Weight: Wt Readings from Last 1 Encounters:  12/25/13 290 lb 6 oz (131.713 kg)    Ideal Body Weight: 202 lb  % Ideal Body Weight: 143%  Wt Readings from Last 10 Encounters:  12/25/13 290 lb 6 oz (131.713 kg)  12/25/13 290 lb 6 oz (131.713 kg)  11/25/13 264 lb (119.75 kg)  10/14/13 278 lb (126.1 kg)   09/01/13 280 lb (127.007 kg)  04/18/13 267 lb 12.8 oz (121.473 kg)  02/16/13 265 lb (120.203 kg)  12/16/12 259 lb 12.8 oz (117.845 kg)  11/17/12 259 lb (117.482 kg)  10/06/12 259 lb (117.482 kg)    Usual Body Weight: 264 lb -- July 2015  % Usual Body Weight: 110%  BMI:  Body mass index is 35.36 kg/(m^2).  Estimated Nutritional Needs: Kcal: 2300-2500 Protein: 125-135 gm Fluid: per MD  Skin: abdominal surgical incision   Diet Order: Clear Liquid  EDUCATION NEEDS: -No education needs identified at this time   Intake/Output Summary (Last 24 hours) at 12/28/13 1331 Last data filed at 12/28/13 0800  Gross per 24 hour  Intake   2028 ml  Output   1410 ml  Net    618 ml   Labs:   Recent Labs Lab 12/25/13 2000 12/26/13 0408 12/27/13 0355  NA 136* 137 139  K 3.2* 3.7 3.4*  CL 100 103 104  CO2 25 26 27   BUN 9 9 6   CREATININE 0.55 0.55 0.48*  CALCIUM 7.5* 7.7* 7.5*  GLUCOSE 124* 106* 114*    CBG (last 3)   Recent Labs  12/26/13 0756 12/26/13 1206 12/26/13 2326  GLUCAP 100* 103* 110*    Scheduled Meds: . antiseptic oral rinse  7 mL Mouth Rinse q12n4p  . chlorhexidine  15 mL Mouth Rinse BID  . enoxaparin (LOVENOX) injection  130 mg Subcutaneous Q12H  . levETIRAcetam  1,500 mg Intravenous 3 times per day  . pantoprazole (PROTONIX) IV  40 mg Intravenous QHS  . phenytoin (DILANTIN)  IV  100 mg Intravenous 4 times per day on Sun Tue Thu Sat  . phenytoin (DILANTIN) IV  100 mg Intravenous 3 times per day on Mon Wed Fri  . piperacillin-tazobactam (ZOSYN)  IV  3.375 g Intravenous Q8H  . sodium chloride  3 mL Intravenous Q12H    Continuous Infusions: . dextrose 5 % and 0.45 % NaCl with KCl 20 mEq/L 75 mL/hr at 12/28/13 1215    Past Medical History  Diagnosis Date  . Coronary artery disease   . Depression   . Peripheral vascular disease   . Seizures   . Deep vein thrombophlebitis of left leg   . Deep vein thrombosis of right lower extremity   . Psoriasis    . Arthritis   . DVT (deep venous thrombosis)     multiple times  . Weakness of both legs   . Peripheral neuropathy   . Venous stasis   . Hyperplasia of prostate   . BPH (benign prostatic hyperplasia)   . Hyperlipidemia     Past Surgical History  Procedure Laterality Date  . Colonoscopy  03/26/2012    Procedure: COLONOSCOPY;  Surgeon: Beryle Beams, MD;  Location: [facility];  Service: Endoscopy;  Laterality: N/A;  . Tonsillectomy      as child  . Flexible sigmoidoscopy N/A 12/09/2013    Procedure: FLEXIBLE SIGMOIDOSCOPY;  Surgeon: Beryle Beams, MD;  Location: [facility];  Service: Endoscopy;  Laterality: N/A;  . Laparotomy N/A 12/25/2013    Procedure: EXPLORATORY LAPAROTOMY/Transverse and right coloectomy/ end  ileostomy and mucus  fistula;  Surgeon: Ralene Ok, MD;  Location: [place];  Service: General;  Laterality: N/A;    Arthur Holms, RD, LDN Pager #: [phone] After-Hours Pager #: [phone]

## 2013-12-28 NOTE — Progress Notes (Signed)
Attempt made x 2 to dangle patient (refuses to attempt to ambulate or to transfer to chair).  Patient so resistant as to make this process unsafe for two assistants.  Requires 3 assists to turn side to side in bed and to attend to basic needs.  Affect very depressed.  Continuously speaking of his "friend that died after a heart attack recently" and his beloved dog that was "mangled by another big dog".  Also speaks of hopelessness with his future.  Speaks highly of sister, who is here to visit him.  Dr. Georgette Dover aware of depressed affect.

## 2013-12-29 LAB — GLUCOSE, CAPILLARY
GLUCOSE-CAPILLARY: 107 mg/dL — AB (ref 70–99)
GLUCOSE-CAPILLARY: 123 mg/dL — AB (ref 70–99)
Glucose-Capillary: 106 mg/dL — ABNORMAL HIGH (ref 70–99)

## 2013-12-29 MED ORDER — ENSURE COMPLETE PO LIQD
237.0000 mL | Freq: Three times a day (TID) | ORAL | Status: DC
  Administered 2013-12-29 – 2014-01-03 (×9): 237 mL via ORAL

## 2013-12-29 NOTE — Consult Note (Signed)
WOC wound consult note Reason for Consult: Consult requested for abd wound vac application.   Wound type: Full thickness midline post-op wound Measurement: 21X10X4cm Wound bed: 100% beefy red Drainage (amount, consistency, odor) Small amt pink drainage on previous dressing. Periwound: Intact skin surrounding. Dressing procedure/placement/frequency: Applied one piece of black foam to 125mm cont suction.  Pt medicated for pain prior to procedure and tolerated with minimal amt pain.  Plan for bedside nurse to change Q T/Th/Sat Will continue to follow-up for ostomy care. Julien Girt MSN, RN, [place], [place], [place]

## 2013-12-29 NOTE — Progress Notes (Signed)
Seems to be in much better spirits today Still with marginal appetite VAC dressing to midline wound  Will return to SNF upon discharge.  Imogene Burn. Georgette Dover, MD, [facility] Surgery  General/ Trauma Surgery  12/29/2013 3:54 PM

## 2013-12-29 NOTE — Progress Notes (Signed)
NUTRITION FOLLOW UP  Intervention:   - Calorie count ordered and instructions given to RN. - Continue Resource Breeze po TID, each supplement provides 250 kcal and 9 grams of protein - Continue Ensure Complete po BID, each supplement provides 350 kcal and 13 grams of protein - RD will continue to follow for nutrition care plan.  Nutrition Dx:   Inadequate oral intake related to altered GI function as evidenced by PO intake 0%; ongoing  Goal:   Pt to meet >/= 90% of their estimated nutrition needs; not met  Monitor:   PO & supplemental intake, weight labs, I/O's  Assessment:   Patient with PMH of CAD, depression, peripheral vascular disease, seizures, DVT and hyperplasia of prostate; presented with large and small bowel distention; work-up in ED (acute abdomen film) showed large amount of free intraperitoneal gas worrisome for ruptured bowel.  Patient s/p procedures 8/16:  EXPLORATORY LAPAROTOMY  PROXIMAL TRANSVERSE & RIGHT COLECTOMY  END ILEOSTOMY & MUCUS FISTULA  8/19: Patient reports a poor appetite; + abdominal pain; very little (if anything) consumed from his lunch Clear Liquid tray; was consuming only clear liquids (ie broth, jello) for approximately 5 days PTA; he states he's lost ~ 23 lbs (from 313 lb to 290 lb) during this time frame, however, wt readings below do no reflect this; he is amenable to trying Resource Breeze oral nutrition supplement; RD to order.  No muscle or subcutaneous fat depletion noticed  8/20: - RD consult received for calorie count. TF will be considered if po intake does not improve per MD note. Spoke with RN and pt who reported that pt's intake today has improved. He has eaten one ice cream cup, over half of an Ensure Complete, a pudding cup, and just received a [employer] which he said that he would try. Intake is much better today than yesterday, but still is poor.  - RN instructed on calorie count.  Labs: CBG's: 101-107 Na WNL K  low  Height: Ht Readings from Last 1 Encounters:  12/25/13 6' 4"  (1.93 m)    Weight Status:   Wt Readings from Last 1 Encounters:  12/25/13 290 lb 6 oz (131.713 kg)    Re-estimated needs:  Kcal: 2300-2500 Protein: 125-135 g Fluid: 2.3-2.5 L/day  Skin: wound vac on abdominal incision  Diet Order: Full Liquid   Intake/Output Summary (Last 24 hours) at 12/29/13 1425 Last data filed at 12/29/13 1200  Gross per 24 hour  Intake   1940 ml  Output   5875 ml  Net  -3935 ml    Last BM: 8/20   Labs:   Recent Labs Lab 12/25/13 2000 12/26/13 0408 12/27/13 0355  NA 136* 137 139  K 3.2* 3.7 3.4*  CL 100 103 104  CO2 25 26 27   BUN 9 9 6   CREATININE 0.55 0.55 0.48*  CALCIUM 7.5* 7.7* 7.5*  GLUCOSE 124* 106* 114*    CBG (last 3)   Recent Labs  12/29/13 0359 12/29/13 0728 12/29/13 1138  GLUCAP 106* 107* 123*    Scheduled Meds: . antiseptic oral rinse  7 mL Mouth Rinse q12n4p  . chlorhexidine  15 mL Mouth Rinse BID  . enoxaparin (LOVENOX) injection  130 mg Subcutaneous Q12H  . feeding supplement (ENSURE COMPLETE)  237 mL Oral TID WC  . feeding supplement (RESOURCE BREEZE)  1 Container Oral TID BM  . levETIRAcetam  1,500 mg Intravenous 3 times per day  . pantoprazole (PROTONIX) IV  40 mg Intravenous QHS  .  phenytoin (DILANTIN) IV  100 mg Intravenous 4 times per day on Sun Tue Thu Sat  . [START ON 12/30/2013] phenytoin (DILANTIN) IV  100 mg Intravenous 3 times per day on Mon Wed Fri  . piperacillin-tazobactam (ZOSYN)  IV  3.375 g Intravenous Q8H  . sodium chloride  3 mL Intravenous Q12H    Continuous Infusions: . dextrose 5 % and 0.45 % NaCl with KCl 20 mEq/L 75 mL/hr (12/28/13 1600)    Terrace Arabia RD, LDN

## 2013-12-29 NOTE — Progress Notes (Signed)
[facility] Progress Note  4 Days Post-Op  Subjective: Pt c/o pain over incision, but improved.  No N/V, tolerating some clears, but appetite low.  Has refused to get out of bed.  PT recommending return to SNF.    Objective: Vital signs in last 24 hours: Temp:  [97.2 F (36.2 C)-98.2 F (36.8 C)] 98.1 F (36.7 C) (08/20 0733) Pulse Rate:  [65-80] 69 (08/20 0326) Resp:  [13-24] 20 (08/20 0326) BP: (116-142)/(57-78) 135/70 mmHg (08/20 0326) SpO2:  [92 %-100 %] 92 % (08/20 0326) Last BM Date: 12/27/13  Intake/Output from previous day: 08/19 0701 - 08/20 0700 In: 2115 [P.O.:240; I.V.:1725; IV Piggyback:150] Out: 2197 [Urine:4025; Stool:125] Intake/Output this shift: Total I/O In: -  Out: 1050 [Urine:1050]  PE: Gen:  Alert, NAD, pleasant Abd: Soft, ND, tender over midline, +BS, no HSM, midline wound clean good granulation tissue, ostomy patent with brown/green fluid and tons of flatus, mucus fistula pink with minimal output   Lab Results:   Recent Labs  12/27/13 0355  WBC 8.1  HGB 11.8*  HCT 34.9*  PLT 196   BMET  Recent Labs  12/27/13 0355  NA 139  K 3.4*  CL 104  CO2 27  GLUCOSE 114*  BUN 6  CREATININE 0.48*  CALCIUM 7.5*   PT/INR No results found for this basename: LABPROT, INR,  in the last 72 hours CMP     Component Value Date/Time   NA 139 12/27/2013 0355   K 3.4* 12/27/2013 0355   CL 104 12/27/2013 0355   CO2 27 12/27/2013 0355   GLUCOSE 114* 12/27/2013 0355   BUN 6 12/27/2013 0355   CREATININE 0.48* 12/27/2013 0355   CALCIUM 7.5* 12/27/2013 0355   PROT 6.7 12/25/2013 1304   ALBUMIN 2.8* 12/25/2013 1304   AST 17 12/25/2013 1304   ALT 20 12/25/2013 1304   ALKPHOS 148* 12/25/2013 1304   BILITOT 0.5 12/25/2013 1304   GFRNONAA >90 12/27/2013 0355   GFRAA >90 12/27/2013 0355   Lipase     Component Value Date/Time   LIPASE 15 12/25/2013 1304       Studies/Results: No results found.  Anti-infectives: Anti-infectives   Start      Dose/Rate Route Frequency Ordered Stop   12/25/13 2200  piperacillin-tazobactam (ZOSYN) IVPB 3.375 g     3.375 g 12.5 mL/hr over 240 Minutes Intravenous Every 8 hours 12/25/13 1521     12/25/13 1500  vancomycin (VANCOCIN) 1,250 mg in sodium chloride 0.9 % 250 mL IVPB  Status:  Discontinued     1,250 mg 166.7 mL/hr over 90 Minutes Intravenous  Once 12/25/13 1418 12/25/13 1914   12/25/13 1430  piperacillin-tazobactam (ZOSYN) IVPB 3.375 g     3.375 g 100 mL/hr over 30 Minutes Intravenous  Once 12/25/13 1418 12/25/13 1630       Assessment/Plan Perforated and ischemic transverse colon POD #4 s/p Ex Lap Transvers and Right colectomy with end ileostomy and mucus fistula  Plan: 1.  Encourage PO intake - advance to fulls and add , hold off on TNA, but may need TF if the patient is unable to take in enough PO himself 2.  [facility] nursing for possible wound vac - may be able to get a seal and he would benefit since the wound is so large 3.  SDU 4.  Encourage ambulation and use of IS which is just above 1000 5.  Hopefully SNF in a few days 6.  Dietitian to do calorie counts  LOS: 4 days    Coralie Keens 12/29/2013, 8:14 AM Pager: [phone]

## 2013-12-29 NOTE — Progress Notes (Signed)
MEDICATION CONSULT NOTE- Follow Up Consult  Pharmacy Consult for Phenytoin Indication: seizure disorder   Allergies  Allergen Reactions  . Penicillins     Mother and brother have had severe allergic reactions, no personal reaction    Patient Measurements: Height: 6\' 4"  (193 cm) Weight: 290 lb 6 oz (131.713 kg) IBW/kg (Calculated) : 86.8  Vital Signs: Temp: 98.1 F (36.7 C) (08/20 0733) Temp src: Oral (08/20 0733) BP: 103/62 mmHg (08/20 0728) Pulse Rate: 63 (08/20 0728) Intake/Output from previous day: 08/19 0701 - 08/20 0700 In: 2115 [P.O.:240; I.V.:1725; IV Piggyback:150] Out: 6568 [Urine:4025; Stool:125] Intake/Output from this shift: Total I/O In: -  Out: 1050 [Urine:1050] Vent settings for last 24 hours:    Labs:  Recent Labs  12/27/13 0355  WBC 8.1  HGB 11.8*  HCT 34.9*  PLT 196  CREATININE 0.48*   Estimated Creatinine Clearance: 143.7 ml/min (by C-G formula based on Cr of 0.48).   Recent Labs  12/28/13 2103 12/29/13 0359 12/29/13 0728  GLUCAP 101* 106* 107*    Medications:  Scheduled:  . antiseptic oral rinse  7 mL Mouth Rinse q12n4p  . chlorhexidine  15 mL Mouth Rinse BID  . enoxaparin (LOVENOX) injection  130 mg Subcutaneous Q12H  . feeding supplement (RESOURCE BREEZE)  1 Container Oral TID BM  . levETIRAcetam  1,500 mg Intravenous 3 times per day  . pantoprazole (PROTONIX) IV  40 mg Intravenous QHS  . phenytoin (DILANTIN) IV  100 mg Intravenous 4 times per day on Sun Tue Thu Sat  . [START ON 12/30/2013] phenytoin (DILANTIN) IV  100 mg Intravenous 3 times per day on Mon Wed Fri  . piperacillin-tazobactam (ZOSYN)  IV  3.375 g Intravenous Q8H  . sodium chloride  3 mL Intravenous Q12H    Assessment: 63 year old male admitted with a perforated viscus now s/p ex-lap with ileostomy.  He has a history of seizures that are managed with Keppra and Phenytoin- at home his regimen is Keppra 1500mg  PO TID, Phenytoin 100mg  PO q6h on TTSS and 100mg  PO q8h  on MWF. He is currently NPO and receiving these medications in IV form.  A phenytoin level on 8/16 was 5.6, and it corrects to 8.5 with an albumin of 2.8. This is slightly subtherapeutic, however, the bioavailability of IV phenytoin is greater than that of PO phenytoin and expect this level to rise while he is receiving IV. No seizures have been noted during his stay.  Goal of Therapy:  Phenytoin total level 10-20 mcg/ml  Plan:  1. Continue Phenytoin at his home regimen, but IV instead of PO. -100mg  IV q6h on Tuesdays, Thursdays, Saturdays, and Sundays -100mg  IV q8h on Mondays, Wednesdays, and Fridays 2. Check Phenytoin level in 5-7 days, or sooner if seizure activity occurs 3. Continue Keppra as ordered per MD- 1500mg  IV q8h 4. Follow for ability to change antiepileptics to PO  Janus Vlcek D. Brooks Stotz, PharmD, BCPS Clinical Pharmacist Pager: [phone] 12/29/2013 8:33 AM

## 2013-12-29 NOTE — Clinical Social Work Psychosocial (Addendum)
Clinical Social Work Department BRIEF PSYCHOSOCIAL ASSESSMENT 12/29/2013  Patient:  [name], [name]     Account Number:  [id number]     Admit date:  12/25/2013  Clinical Social Worker:  Delrae Sawyers  Date/Time:  12/29/2013 12:27 PM  Referred by:  Physician  Date Referred:  12/29/2013 Referred for  SNF Placement   Other Referral:   none.   Interview type:  Family Other interview type:   CSW spoke with pt's sister, [name] [phone]), regarding discharge disposition.    PSYCHOSOCIAL DATA Living Status:  FACILITY Admitted from facility:  [facility] Level of care:  [place] Primary support name:  [name] Primary support relationship to patient:  CHILD, ADULT Degree of support available:   Strong support.    CURRENT CONCERNS Current Concerns  Post-Acute Placement   Other Concerns:   none.    SOCIAL WORK ASSESSMENT / PLAN CSW spoke with pt's sister, [name], regarding pt's discharge diposition once medically stable for discharge. Pt's sister informed CSW pt was unhappy at previous SNF and would "most likely" prefer another SNF at time of discharge. Pt's sister stated she is agreeable to new SNF search.    CSW to continue to follow and assist with discharge planning needs.   Assessment/plan status:  Psychosocial Support/Ongoing Assessment of Needs Other assessment/ plan:   none.   Information/referral to community resources:   [facility] bed offers.    PATIENT'S/FAMILY'S RESPONSE TO PLAN OF CARE: Pt's sister understanding and agreeable to CSW plan of care. Pt's sister expressed no further questions or concerns at this time.       Lubertha Sayres, MSW, [facility] Licensed Clinical Social Worker [phone] and [phone]

## 2013-12-29 NOTE — Progress Notes (Signed)
Utilization review completed.  

## 2013-12-30 LAB — CBC
HCT: 35.9 % — ABNORMAL LOW (ref 39.0–52.0)
HEMOGLOBIN: 12.3 g/dL — AB (ref 13.0–17.0)
MCH: 31.3 pg (ref 26.0–34.0)
MCHC: 34.3 g/dL (ref 30.0–36.0)
MCV: 91.3 fL (ref 78.0–100.0)
PLATELETS: 276 10*3/uL (ref 150–400)
RBC: 3.93 MIL/uL — AB (ref 4.22–5.81)
RDW: 12.6 % (ref 11.5–15.5)
WBC: 6.7 10*3/uL (ref 4.0–10.5)

## 2013-12-30 MED ORDER — BISACODYL 10 MG RE SUPP
10.0000 mg | Freq: Every day | RECTAL | Status: DC
  Administered 2013-12-30 – 2014-01-03 (×4): 10 mg via RECTAL
  Filled 2013-12-30 (×4): qty 1

## 2013-12-30 MED ORDER — SODIUM CHLORIDE 0.9 % IJ SOLN
10.0000 mL | INTRAMUSCULAR | Status: DC | PRN
  Administered 2013-12-31 – 2014-01-02 (×4): 10 mL

## 2013-12-30 MED ORDER — KCL IN DEXTROSE-NACL 20-5-0.45 MEQ/L-%-% IV SOLN
INTRAVENOUS | Status: DC
  Administered 2013-12-30 – 2014-01-03 (×4): via INTRAVENOUS
  Filled 2013-12-30 (×4): qty 1000

## 2013-12-30 NOTE — Progress Notes (Signed)
Calorie Count Note  48 hour calorie count ordered.  Diet: Soft Diet  Supplements:  - Resource Breeze po TID, each supplement provides 250 kcal and 9 grams of protein -Ensure Complete po BID, each supplement provides 350 kcal and 13 grams of protein  8/20 Lunch: 140 kcal and 4 g protein Dinner: 261 kcal, 12 g protein Supplements: 1 and 1/3 Ensure Complete Drinks, 1 Resource breeze (705 kcal and 26 g protein)  Total intake: ( Reflects only 2 of 3 meals) 1106 kcal (48% of minimum estimated needs)  42 g protein (34% of minimum estimated needs)  8/21 Breakfast: Not recorded Lunch: 423 kcal and 32 g protein  Total intake: (Reflects only 1 of 3 meals) 423 kcal (18% of minimum estimated needs) 32 g protein (25% of minimum estimated needs)  Nutrition Dx: Inadequate oral intake related to altered GI function as evidenced by PO intake 0%; ongoing, but improved  Goal: Pt to meet >/= 90% of their estimated nutrition needs \  Intervention: Continue supplements and 48-hour Calorie Count  Terrace Arabia RD, LDN

## 2013-12-30 NOTE — Progress Notes (Signed)
Spirits seem better Inquiring about ostomy reversal - in a few months if medical status is stable  Wound - VAC Appetite improving  SNF next week.  Imogene Burn. Georgette Dover, MD, [facility] Surgery  General/ Trauma Surgery  12/30/2013 3:00 PM

## 2013-12-30 NOTE — Progress Notes (Signed)
ANTICOAGULATION CONSULT NOTE - Follow Up Consult  Pharmacy Consult for Lovenox Indication: h/o DVTs  Allergies  Allergen Reactions  . Penicillins     Mother and brother have had severe allergic reactions, no personal reaction    Patient Measurements: Height: 6\' 4"  (193 cm) Weight: 290 lb 6 oz (131.713 kg) IBW/kg (Calculated) : 86.8  Vital Signs: Temp: 97.7 F (36.5 C) (08/21 0753) Temp src: Oral (08/21 0753) BP: 135/84 mmHg (08/21 0753) Pulse Rate: 64 (08/21 0753)  Labs:  Recent Labs  12/30/13 0430  HGB 12.3*  HCT 35.9*  PLT 276    Estimated Creatinine Clearance: 143.7 ml/min (by C-G formula based on Cr of 0.48).  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 On Xarelto 20mg  daily PTA - held for surgery on 8/16. He continues on therapeutic lovenox for now while PO intake is limited. Dose remains appropriate, CBC is stable and no bleeding noted.   Goal of Therapy:  Anti-Xa level 0.6-1 units/ml 4hrs after LMWH dose given Monitor platelets by anticoagulation protocol: Yes   Plan:  1. Continue lovenox 130mg  SQ Q12H 2. F/u CBC Q72H while on lovenox 3. F/u ability to restart xarelto  Salome Arnt, PharmD, BCPS Pager # [phone] 12/30/2013 10:32 AM

## 2013-12-30 NOTE — Progress Notes (Signed)
Physical Therapy Treatment Patient Details Name: [name] MRN: [id number] DOB: [birth year] Today's Date: 12/30/2013    History of Present Illness Pt adm with perforated colon and underwent EXPLORATORY LAPAROTOMY/ ProximalTransverse and right coloectomy/ end  ileostomy and mucus  fistula. PMH - Lt ankle fx 08/2013 with stay at SNF until admission, rt ankle fx, morbid obesity, PVD, Seizure, DVT    PT Comments    Patient with improvements in activity tolerance and willingness to participate today. Patient assisted to EOB and tolerated there-ex well. Patient limited by rectal pain at this time, assisted patient back to bed after session (nsg to place enema) Recommend OOB to chair with staff (using lift).  Will see and progress as tolerated. Continued to recommend SNF upon acute discharge.  Follow Up Recommendations  SNF     Equipment Recommendations  None recommended by PT    Recommendations for Other Services       Precautions / Restrictions Precautions Precautions: Fall Restrictions Weight Bearing Restrictions: Yes LLE Weight Bearing: Non weight bearing    Mobility  Bed Mobility Overal bed mobility: Needs Assistance Bed Mobility: Rolling;Sidelying to Sit;Sit to Sidelying Rolling: Min assist Sidelying to sit: Max assist     Sit to sidelying: Max assist General bed mobility comments: Elevated HOB for support, increased assist to elevated trunk with verbalc sues for hand placements and positioning. Patient with increased abilility to push to upright today.  Transfers                    Ambulation/Gait                 Stairs            Wheelchair Mobility    Modified Rankin (Stroke Patients Only)       Balance Overall balance assessment: Needs assistance Sitting-balance support: Feet supported Sitting balance-Leahy Scale: Fair Sitting balance - Comments: Patient able to sit EOB with support of rail and bed with UEs for breif periods  without physical assist Postural control: Posterior lean                          Cognition Arousal/Alertness: Awake/alert Behavior During Therapy: Flat affect Overall Cognitive Status: Within Functional Limits for tasks assessed                      Exercises General Exercises - Lower Extremity Ankle Circles/Pumps: AROM;Both;10 reps (performed supine in bed ) Long Arc Quad: AROM;Strengthening;Both;10 reps;Seated (seated EOB) Hip ABduction/ADduction: AROM;Strengthening;Both;10 reps;Supine (performed supine in bed) Hip Flexion/Marching: AROM;Strengthening;Both;10 reps;Seated (limited ROM 2/2 body habitus and increased rectal pressure) Other Exercises Other Exercises: Weight shifts and trunk control performed EOB, limited by rectal pain and pressure    General Comments        Pertinent Vitals/Pain Pain Assessment: 0-10 Pain Score: 8  Pain Location: rectum pain  Pain Descriptors / Indicators: Aching;Constant    Home Living Family/patient expects to be discharged to:: Skilled nursing facility               Additional Comments: Pt has been at SNF since ankle fx in April    Prior Function Level of Independence: Needs assistance  Gait / Transfers Assistance Needed: Pt reports to OT that he has not ambulated since ankle fracture.  He states he has only been lifted to commode with a hoyer, and stopped therapy soon after admission to SNF due to abdominal pain  ADL's / Homemaking Assistance Needed: total A since April.  Prior to April, he reports he was independent      PT Goals (current goals can now be found in the care plan section) Acute Rehab PT Goals PT Goal Formulation: With patient Time For Goal Achievement: 01/10/14 Potential to Achieve Goals: Fair Progress towards PT goals: Progressing toward goals    Frequency  Min 2X/week    PT Plan Current plan remains appropriate    Co-evaluation             End of Session Equipment Utilized During  Treatment: Oxygen Activity Tolerance: Patient limited by pain Patient left: in bed;with call bell/phone within reach     Time: 0814-0840 PT Time Calculation (min): 26 min  Charges:  $Therapeutic Exercise: 8-22 mins $Therapeutic Activity: 8-22 mins                    G CodesDuncan Dull 2014-01-10, 9:31 AM Alben Deeds, PT DPT  [phone]

## 2013-12-30 NOTE — Progress Notes (Signed)
Pt report called to 6N-Brea, RN.  Medicine up to date. Belongs packed to be sent to new room. VSS- CMT and elink notified of transfer.

## 2013-12-30 NOTE — Progress Notes (Signed)
[facility] Progress Note  5 Days Post-Op  Subjective: Abdomen is not very tender.  No N/V, wound vac in place.  Low appetite.  Got to side of bed and did some exercises with PT today.  Feels a lot of pain in his rectum - rectal pressure - hasn't passed anything from below.  Ostomy working well with flatus.  Working on IS much improved to 1750.  Objective: Vital signs in last 24 hours: Temp:  [97.5 F (36.4 C)-98.1 F (36.7 C)] 97.5 F (36.4 C) (08/21 0430) Pulse Rate:  [64-75] 73 (08/21 0430) Resp:  [10-18] 12 (08/21 0430) BP: (124-166)/(72-90) 166/90 mmHg (08/21 0430) SpO2:  [92 %-100 %] 92 % (08/21 0430) Last BM Date: 12/27/13  Intake/Output from previous day: 08/20 0701 - 08/21 0700 In: 2307 [P.O.:357; I.V.:1800; IV Piggyback:150] Out: 6000 [Urine:5400; Stool:600] Intake/Output this shift:    PE: Gen:  Alert, NAD, pleasant Pulm:  IS to 1750 Abd: Soft, NT/ND, +BS, no HSM, large midline wound now with wound vac in place, ostomy patent with brown/green fluid and tons of flatus, mucus fistula pink with minimal output   Lab Results:   Recent Labs  12/30/13 0430  WBC 6.7  HGB 12.3*  HCT 35.9*  PLT 276   BMET No results found for this basename: NA, K, CL, CO2, GLUCOSE, BUN, CREATININE, CALCIUM,  in the last 72 hours PT/INR No results found for this basename: LABPROT, INR,  in the last 72 hours CMP     Component Value Date/Time   NA 139 12/27/2013 0355   K 3.4* 12/27/2013 0355   CL 104 12/27/2013 0355   CO2 27 12/27/2013 0355   GLUCOSE 114* 12/27/2013 0355   BUN 6 12/27/2013 0355   CREATININE 0.48* 12/27/2013 0355   CALCIUM 7.5* 12/27/2013 0355   PROT 6.7 12/25/2013 1304   ALBUMIN 2.8* 12/25/2013 1304   AST 17 12/25/2013 1304   ALT 20 12/25/2013 1304   ALKPHOS 148* 12/25/2013 1304   BILITOT 0.5 12/25/2013 1304   GFRNONAA >90 12/27/2013 0355   GFRAA >90 12/27/2013 0355   Lipase     Component Value Date/Time   LIPASE 15 12/25/2013 1304        Studies/Results: No results found.  Anti-infectives: Anti-infectives   Start     Dose/Rate Route Frequency Ordered Stop   12/25/13 2200  piperacillin-tazobactam (ZOSYN) IVPB 3.375 g     3.375 g 12.5 mL/hr over 240 Minutes Intravenous Every 8 hours 12/25/13 1521     12/25/13 1500  vancomycin (VANCOCIN) 1,250 mg in sodium chloride 0.9 % 250 mL IVPB  Status:  Discontinued     1,250 mg 166.7 mL/hr over 90 Minutes Intravenous  Once 12/25/13 1418 12/25/13 1914   12/25/13 1430  piperacillin-tazobactam (ZOSYN) IVPB 3.375 g     3.375 g 100 mL/hr over 30 Minutes Intravenous  Once 12/25/13 1418 12/25/13 1630       Assessment/Plan Perforated and ischemic transverse colon  POD #5 s/p Ex Lap Transvers and Right colectomy with end ileostomy and mucus fistula   Plan:  1. Reduce IVF, Encourage PO intake - advance to soft diet and continue ensure, hold off on TNA, but may need TF if the patient is unable to take in enough PO himself  2. [facility] nursing for possible wound vac - may be able to get a seal and he would benefit since the wound is so large  3. SDU  4. Encourage ambulation and use of IS which is  just above 1000   5. Hopefully SNF soon (Saturday if SNF will take him or Monday?) 6. Dietitian to do calorie counts 7. Having a lot of rectal pain, might have some residual stool which needs to come out - will do a dulcolax suppository to see if we can empty his stump, may need enema 8. Transfer to med surg bed   LOS: 5 days    Coralie Keens 12/30/2013, 7:38 AM Pager: [phone]

## 2013-12-31 LAB — BASIC METABOLIC PANEL
Anion gap: 11 (ref 5–15)
BUN: 5 mg/dL — ABNORMAL LOW (ref 6–23)
CO2: 25 mEq/L (ref 19–32)
Calcium: 8.1 mg/dL — ABNORMAL LOW (ref 8.4–10.5)
Chloride: 105 mEq/L (ref 96–112)
Creatinine, Ser: 0.58 mg/dL (ref 0.50–1.35)
GFR calc Af Amer: >90 mL/min (ref >90)
GFR calc non Af Amer: >90 mL/min (ref >90)
Glucose, Bld: 116 mg/dL — ABNORMAL HIGH (ref 70–99)
Potassium: 4.3 mEq/L (ref 3.7–5.3)
Sodium: 141 mEq/L (ref 137–147)

## 2013-12-31 NOTE — Progress Notes (Signed)
6 Days Post-Op  Subjective: Complains of not feeling well but not much different than previous days  Objective: Vital signs in last 24 hours: Temp:  [97.5 F (36.4 C)-98.3 F (36.8 C)] 98 F (36.7 C) (08/22 0126) Pulse Rate:  [58-71] 65 (08/22 0126) Resp:  [15-18] 16 (08/22 0126) BP: (126-145)/(66-84) 128/74 mmHg (08/22 0126) SpO2:  [94 %-96 %] 96 % (08/22 0126) Weight:  [285 lb (129.275 kg)] 285 lb (129.275 kg) (08/21 1820) Last BM Date: 12/30/13  Intake/Output from previous day: 08/21 0701 - 08/22 0700 In: 1083 [P.O.:320; I.V.:763] Out: 4475 [Urine:4050; Stool:425] Intake/Output this shift: Total I/O In: 13 [I.V.:13] Out: 1450 [Urine:1200; Stool:250]  Resp: clear to auscultation bilaterally Cardio: regular rate and rhythm GI: soft, nontender. ostomy and mf productive. vac intact along midline wound  Lab Results:   Recent Labs  12/30/13 0430  WBC 6.7  HGB 12.3*  HCT 35.9*  PLT 276   BMET No results found for this basename: NA, K, CL, CO2, GLUCOSE, BUN, CREATININE, CALCIUM,  in the last 72 hours PT/INR No results found for this basename: LABPROT, INR,  in the last 72 hours ABG No results found for this basename: PHART, PCO2, PO2, HCO3,  in the last 72 hours  Studies/Results: No results found.  Anti-infectives: Anti-infectives   Start     Dose/Rate Route Frequency Ordered Stop   12/25/13 2200  piperacillin-tazobactam (ZOSYN) IVPB 3.375 g     3.375 g 12.5 mL/hr over 240 Minutes Intravenous Every 8 hours 12/25/13 1521     12/25/13 1500  vancomycin (VANCOCIN) 1,250 mg in sodium chloride 0.9 % 250 mL IVPB  Status:  Discontinued     1,250 mg 166.7 mL/hr over 90 Minutes Intravenous  Once 12/25/13 1418 12/25/13 1914   12/25/13 1430  piperacillin-tazobactam (ZOSYN) IVPB 3.375 g     3.375 g 100 mL/hr over 30 Minutes Intravenous  Once 12/25/13 1418 12/25/13 1630      Assessment/Plan: s/p Procedure(s): EXPLORATORY LAPAROTOMY/Transverse and right coloectomy/ end   ileostomy and mucus  fistula (N/A) Encourage po's PT Await snf  LOS: 6 days    TOTH III,PAUL S 12/31/2013

## 2014-01-01 LAB — PHENYTOIN LEVEL, TOTAL: Phenytoin Lvl: 6 ug/mL — ABNORMAL LOW (ref 10.0–20.0)

## 2014-01-01 LAB — CULTURE, BLOOD (ROUTINE X 2)
CULTURE: NO GROWTH
Culture: NO GROWTH

## 2014-01-01 MED ORDER — HYDROCORTISONE 2.5 % RE CREA
TOPICAL_CREAM | Freq: Two times a day (BID) | RECTAL | Status: DC
  Administered 2014-01-01 – 2014-01-02 (×3): via RECTAL
  Filled 2014-01-01 (×2): qty 28.35

## 2014-01-01 MED ORDER — HYDROCORTISONE ACE-PRAMOXINE 2.5-1 % RE CREA
TOPICAL_CREAM | Freq: Two times a day (BID) | RECTAL | Status: DC
  Administered 2014-01-01 – 2014-01-02 (×2): via RECTAL
  Administered 2014-01-03: 1 via RECTAL
  Filled 2014-01-01 (×2): qty 30

## 2014-01-01 NOTE — Progress Notes (Signed)
7 Days Post-Op  Subjective: Complains of anal pain, intermittent Otherwise no new changes  Objective: Vital signs in last 24 hours: Temp:  [97.4 F (36.3 C)-97.6 F (36.4 C)] 97.4 F (36.3 C) (08/23 0532) Pulse Rate:  [65-66] 65 (08/23 0532) Resp:  [17-19] 17 (08/23 0532) BP: (128-134)/(57-73) 134/57 mmHg (08/23 0532) SpO2:  [94 %-97 %] 96 % (08/23 0532) Last BM Date: 12/31/13  Intake/Output from previous day: 08/22 0701 - 08/23 0700 In: 1807.2 [P.O.:580; I.V.:327.2; IV Piggyback:900] Out: 5003 [Urine:3825; Stool:530] Intake/Output this shift:    Abdomen soft, VAC in place  Lab Results:   Recent Labs  12/30/13 0430  WBC 6.7  HGB 12.3*  HCT 35.9*  PLT 276   BMET  Recent Labs  12/31/13 0609  NA 141  K 4.3  CL 105  CO2 25  GLUCOSE 116*  BUN 5*  CREATININE 0.58  CALCIUM 8.1*   PT/INR No results found for this basename: LABPROT, INR,  in the last 72 hours ABG No results found for this basename: PHART, PCO2, PO2, HCO3,  in the last 72 hours  Studies/Results: No results found.  Anti-infectives: Anti-infectives   Start     Dose/Rate Route Frequency Ordered Stop   12/25/13 2200  piperacillin-tazobactam (ZOSYN) IVPB 3.375 g     3.375 g 12.5 mL/hr over 240 Minutes Intravenous Every 8 hours 12/25/13 1521     12/25/13 1500  vancomycin (VANCOCIN) 1,250 mg in sodium chloride 0.9 % 250 mL IVPB  Status:  Discontinued     1,250 mg 166.7 mL/hr over 90 Minutes Intravenous  Once 12/25/13 1418 12/25/13 1914   12/25/13 1430  piperacillin-tazobactam (ZOSYN) IVPB 3.375 g     3.375 g 100 mL/hr over 30 Minutes Intravenous  Once 12/25/13 1418 12/25/13 1630      Assessment/Plan: s/p Procedure(s): EXPLORATORY LAPAROTOMY/Transverse and 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  Will try analpram  LOS: 7 days    Marckus Hanover A 01/01/2014

## 2014-01-01 NOTE — Progress Notes (Signed)
Patient refused to ambulate and move to chair turned every two hours and applied barrier creme to buttocks and sacral dressing in place. Skin intact.

## 2014-01-01 NOTE — Progress Notes (Signed)
MEDICATION RELATED CONSULT NOTE - FOLLOW UP   Pharmacy Consult for phenytoin Indication: seizures  Allergies  Allergen Reactions  . Penicillins     Mother and brother have had severe allergic reactions, no personal reaction    Patient Measurements: Height: 6\' 5"  (195.6 cm) Weight: 285 lb (129.275 kg) IBW/kg (Calculated) : 89.1  Vital Signs: Temp: 97.4 F (36.3 C) (08/23 0532) Temp src: Oral (08/23 0532) BP: 134/57 mmHg (08/23 0532) Pulse Rate: 65 (08/23 0532) Intake/Output from previous day: 08/22 0701 - 08/23 0700 In: 1807.2 [P.O.:580; I.V.:327.2; IV EHMCNOBSJ:628] Out: 3662 [Urine:3825; Stool:530] Intake/Output from this shift:    Labs:  Recent Labs  12/30/13 0430 12/31/13 0609  WBC 6.7  --   HGB 12.3*  --   HCT 35.9*  --   PLT 276  --   CREATININE  --  0.58   Estimated Creatinine Clearance: 144.3 ml/min (by C-G formula based on Cr of 0.58).   Microbiology: Recent Results (from the past 720 hour(s))  CULTURE, BLOOD (ROUTINE X 2)     Status: None   Collection Time    12/25/13  2:48 PM      Result Value Ref Range Status   Specimen Description BLOOD LEFT ARM   Final   Special Requests BOTTLES DRAWN AEROBIC AND ANAEROBIC [place]   Final   Culture  Setup Time     Final   Value: 12/26/2013 00:56     Performed at [facility]   Culture     Final   Value:        BLOOD CULTURE RECEIVED NO GROWTH TO DATE CULTURE WILL BE HELD FOR 5 DAYS BEFORE ISSUING A FINAL NEGATIVE REPORT     Performed at [facility]   Report Status PENDING   Incomplete  CULTURE, BLOOD (ROUTINE X 2)     Status: None   Collection Time    12/25/13  2:56 PM      Result Value Ref Range Status   Specimen Description BLOOD LEFT HAND   Final   Special Requests BOTTLES DRAWN AEROBIC ONLY 10CC   Final   Culture  Setup Time     Final   Value: 12/26/2013 00:56     Performed at [facility]   Culture     Final   Value:        BLOOD CULTURE RECEIVED NO GROWTH TO DATE  CULTURE WILL BE HELD FOR 5 DAYS BEFORE ISSUING A FINAL NEGATIVE REPORT     Performed at [facility]   Report Status PENDING   Incomplete  URINE CULTURE     Status: None   Collection Time    12/25/13 10:50 PM      Result Value Ref Range Status   Specimen Description URINE, CATHETERIZED   Final   Special Requests NONE   Final   Culture  Setup Time     Final   Value: 12/26/2013 10:06     Performed at [facility]     Final   Value: NO GROWTH     Performed at [facility]   Culture     Final   Value: NO GROWTH     Performed at [facility]   Report Status 12/27/2013 FINAL   Final  MRSA PCR SCREENING     Status: None   Collection Time    12/26/13  3:20 AM      Result Value Ref Range Status  MRSA by PCR NEGATIVE  NEGATIVE Final   Comment:            The GeneXpert MRSA Assay (FDA     approved for NASAL specimens     only), is one component of a     comprehensive MRSA colonization     surveillance program. It is not     intended to diagnose MRSA     infection nor to guide or     monitor treatment for     MRSA infections.    Medications:  Phenytoin 100mg  QID on TTSS and 100mg  TID MWF (home dose)  Assessment: 89 yom initially presented to the hospital with abdominal pain. He is on chronic phenytoin for history of seizures. A phenytoin level was checked today and was reported as 6 (corrects to 9.1 d/t low albumin). This is slightly below goal. However, there has been no recent seizure activity.   Goal of Therapy:  Total phenytoin = 10-20 Free phenytoin = 1-2  Plan:  1. Continue home regimen of phenytoin 2. F/u S&S of seizure activity which may require a dose increase  Taylinn Brabant, Rande Lawman 01/01/2014,8:33 AM

## 2014-01-02 LAB — CBC
HCT: 37.9 % — ABNORMAL LOW (ref 39.0–52.0)
Hemoglobin: 12.4 g/dL — ABNORMAL LOW (ref 13.0–17.0)
MCH: 31.2 pg (ref 26.0–34.0)
MCHC: 32.7 g/dL (ref 30.0–36.0)
MCV: 95.5 fL (ref 78.0–100.0)
PLATELETS: 320 10*3/uL (ref 150–400)
RBC: 3.97 MIL/uL — ABNORMAL LOW (ref 4.22–5.81)
RDW: 13.4 % (ref 11.5–15.5)
WBC: 6.7 10*3/uL (ref 4.0–10.5)

## 2014-01-02 MED ORDER — PHENYTOIN SODIUM EXTENDED 100 MG PO CAPS
100.0000 mg | ORAL_CAPSULE | ORAL | Status: DC
  Administered 2014-01-02: 100 mg via ORAL
  Filled 2014-01-02: qty 1

## 2014-01-02 MED ORDER — FLUOXETINE HCL 20 MG PO CAPS
20.0000 mg | ORAL_CAPSULE | Freq: Every morning | ORAL | Status: DC
  Administered 2014-01-02 – 2014-01-03 (×2): 20 mg via ORAL
  Filled 2014-01-02 (×2): qty 1

## 2014-01-02 MED ORDER — PHENOL 1.4 % MT LIQD: 1.0000 | OROMUCOSAL | Status: DC | PRN

## 2014-01-02 MED ORDER — ADULT MULTIVITAMIN W/MINERALS CH
1.0000 | ORAL_TABLET | Freq: Every day | ORAL | Status: DC
  Administered 2014-01-02 – 2014-01-03 (×2): 1 via ORAL
  Filled 2014-01-02 (×2): qty 1

## 2014-01-02 MED ORDER — PHENYTOIN SODIUM EXTENDED 100 MG PO CAPS
100.0000 mg | ORAL_CAPSULE | ORAL | Status: DC
  Administered 2014-01-03 (×2): 100 mg via ORAL
  Filled 2014-01-02 (×4): qty 1

## 2014-01-02 MED ORDER — GABAPENTIN 600 MG PO TABS
600.0000 mg | ORAL_TABLET | Freq: Three times a day (TID) | ORAL | Status: DC
  Administered 2014-01-02 – 2014-01-03 (×4): 600 mg via ORAL
  Filled 2014-01-02 (×6): qty 1

## 2014-01-02 MED ORDER — LEVETIRACETAM 750 MG PO TABS
1500.0000 mg | ORAL_TABLET | Freq: Three times a day (TID) | ORAL | Status: DC
  Administered 2014-01-02 – 2014-01-03 (×4): 1500 mg via ORAL
  Filled 2014-01-02 (×6): qty 2

## 2014-01-02 MED ORDER — ACETAMINOPHEN 325 MG PO TABS: 325.0000 mg | ORAL_TABLET | Freq: Four times a day (QID) | ORAL | Status: DC | PRN

## 2014-01-02 MED ORDER — TAMSULOSIN HCL 0.4 MG PO CAPS
0.4000 mg | ORAL_CAPSULE | Freq: Every day | ORAL | Status: DC
  Administered 2014-01-02 – 2014-01-03 (×2): 0.4 mg via ORAL
  Filled 2014-01-02 (×2): qty 1

## 2014-01-02 MED ORDER — HYDROCODONE-ACETAMINOPHEN 5-325 MG PO TABS
1.0000 | ORAL_TABLET | ORAL | Status: DC | PRN
  Administered 2014-01-02 – 2014-01-03 (×5): 2 via ORAL
  Filled 2014-01-02 (×5): qty 2

## 2014-01-02 NOTE — Clinical Social Work Placement (Addendum)
Clinical Social Work Department CLINICAL SOCIAL WORK PLACEMENT NOTE 01/02/2014  Patient:  [name], [name]  Account Number:  [id number] Admit date:  12/25/2013  Clinical Social Worker:  Domenica Reamer, CLINICAL SOCIAL WORKER  Date/time:  01/02/2014 03:24 PM  Clinical Social Work is seeking post-discharge placement for this patient at the following level of care:   SKILLED NURSING   (*CSW will update this form in Epic as items are completed)   12/29/2013  Patient/family provided with [place] Department of Clinical Social Work's list of facilities offering this level of care within the geographic area requested by the patient (or if unable, by the patient's family).  12/29/2013  Patient/family informed of their freedom to choose among providers that offer the needed level of care, that participate in Medicare, Medicaid or managed care program needed by the patient, have an available bed and are willing to accept the patient.  12/29/2013  Patient/family informed of [facility]' ownership interest in [facility], as well as of the fact that they are under no obligation to receive care at this facility.  PASARR submitted to EDS on (already existing) PASARR number received on   FL2 transmitted to all facilities in geographic area requested by pt/family on  01/02/2014 FL2 transmitted to all facilities within larger geographic area on   Patient informed that his/her managed care company has contracts with or will negotiate with  certain facilities, including the following:     Patient/family informed of bed offers received:  01/03/14  Patient chooses bed at [facility] and [place] recommends and patient chooses bed at    Patient to be transferred to [facility] on  01/03/14 Patient to be transferred to facility by [facility] Patient and family notified of transfer on 01/03/14 Name of family member notified:  Manuela Schwartz (sister)  The following physician request  were entered in Epic:   Additional Comments:  No additional needs.  CSW signing off.  Domenica Reamer, [place] Social Worker [phone]

## 2014-01-02 NOTE — Progress Notes (Signed)
Patient ID: [name], male   DOB: 04-04-1951, 63 y.o.   MRN: [id number]     [facility]      [address] [place], [place] [address]    Phone: [phone] FAX: [phone]     Subjective: Appetite is better, drinking ensures.    Objective:  Vital signs:  Filed Vitals:   01/01/14 0532 01/01/14 1407 01/01/14 2211 01/02/14 0548  BP: 134/57 121/70 122/67 120/66  Pulse: 65 66 58 60  Temp: 97.4 F (36.3 C) 97.5 F (36.4 C) 97.9 F (36.6 C) 98.1 F (36.7 C)  TempSrc: Oral Oral Oral Oral  Resp: 17 16 17 17   Height:      Weight:      SpO2: 96% 97% 99% 98%    Last BM Date: 12/31/13  Intake/Output   Yesterday:  08/23 0701 - 08/24 0700 In: 713.3 [P.O.:480; I.V.:160.3; IV Piggyback:73] Out: 4400 [Urine:4050; Stool:350] This shift:  Total I/O In: 240 [P.O.:240] Out: -    Problem List:   Active Problems:   Free intraperitoneal air   Perforated viscus   Physical exam Gen: Alert, NAD, pleasant  Pulm: cta. Cardio: s1s2 rrr, no murmurs gallops or rubs.  2/2 pulses, no edema. Abd: Soft, NT/ND, +BS, no HSM, large midline wound now with wound vac in place, ostomy patent with brown/green fluid and tons of flatus, mucus fistula pink with minimal output    Results:   Labs: Results for orders placed during the hospital encounter of 12/25/13 (from the past 48 hour(s))  PHENYTOIN LEVEL, TOTAL     Status: Abnormal   Collection Time    01/01/14  5:44 AM      Result Value Ref Range   Phenytoin Lvl 6.0 (*) 10.0 - 20.0 ug/mL  CBC     Status: Abnormal   Collection Time    01/02/14  5:40 AM      Result Value Ref Range   WBC 6.7  4.0 - 10.5 K/uL   RBC 3.97 (*) 4.22 - 5.81 MIL/uL   Hemoglobin 12.4 (*) 13.0 - 17.0 g/dL   HCT 37.9 (*) 39.0 - 52.0 %   MCV 95.5  78.0 - 100.0 fL   MCH 31.2  26.0 - 34.0 pg   MCHC 32.7  30.0 - 36.0 g/dL   RDW 13.4  11.5 - 15.5 %   Platelets 320  150 - 400 K/uL    Imaging / Studies: No  results found.  Medications / Allergies:  Scheduled Meds: . antiseptic oral rinse  7 mL Mouth Rinse q12n4p  . bisacodyl  10 mg Rectal Daily  . chlorhexidine  15 mL Mouth Rinse BID  . enoxaparin (LOVENOX) injection  130 mg Subcutaneous Q12H  . feeding supplement (ENSURE COMPLETE)  237 mL Oral TID WC  . feeding supplement (RESOURCE BREEZE)  1 Container Oral TID BM  . hydrocortisone   Rectal BID  . hydrocortisone-pramoxine   Rectal BID  . levETIRAcetam  1,500 mg Intravenous 3 times per day  . pantoprazole (PROTONIX) IV  40 mg Intravenous QHS  . phenytoin (DILANTIN) IV  100 mg Intravenous 4 times per day on Sun Tue Thu Sat  . phenytoin (DILANTIN) IV  100 mg Intravenous 3 times per day on Mon Wed Fri  . piperacillin-tazobactam (ZOSYN)  IV  3.375 g Intravenous Q8H  . sodium chloride  3 mL Intravenous Q12H   Continuous Infusions: . dextrose 5 % and 0.45 % NaCl with KCl  20 mEq/L 10 mL/hr at 01/01/14 0105   PRN Meds:.HYDROmorphone (DILAUDID) injection, ondansetron, phenol, sodium chloride, sodium chloride  Antibiotics: Anti-infectives   Start     Dose/Rate Route Frequency Ordered Stop   12/25/13 2200  piperacillin-tazobactam (ZOSYN) IVPB 3.375 g     3.375 g 12.5 mL/hr over 240 Minutes Intravenous Every 8 hours 12/25/13 1521     12/25/13 1500  vancomycin (VANCOCIN) 1,250 mg in sodium chloride 0.9 % 250 mL IVPB  Status:  Discontinued     1,250 mg 166.7 mL/hr over 90 Minutes Intravenous  Once 12/25/13 1418 12/25/13 1914   12/25/13 1430  piperacillin-tazobactam (ZOSYN) IVPB 3.375 g     3.375 g 100 mL/hr over 30 Minutes Intravenous  Once 12/25/13 1418 12/25/13 1630      Assessment/Plan Perforated and ischemic transverse colon  POD #8 s/p Ex Lap Transvers and 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  Meets criteria for discharge -DC to SNF when bed is available, SW on  board. -Ensure TID BM -SCD/lovenox -VAC change T/TH/S -D#8 of zosyn, ok to DC? -resume home meds except Xarelto, ok to resume?  Erby Pian, [facility] Pager [phone]) For consults and floor pages call [phone](7A-4:30P)  01/02/2014 9:00 AM

## 2014-01-02 NOTE — Progress Notes (Signed)
MEDICATION RELATED CONSULT NOTE - FOLLOW UP   Pharmacy Consult for phenytoin and Lovenox Indication: seizures and h/o DVTs  Allergies  Allergen Reactions  . Penicillins     Mother and brother have had severe allergic reactions, no personal reaction    Patient Measurements: Height: 6\' 5"  (195.6 cm) Weight: 285 lb (129.275 kg) IBW/kg (Calculated) : 89.1  Vital Signs: Temp: 97.5 F (36.4 C) (08/24 1321) Temp src: Oral (08/24 1321) BP: 121/71 mmHg (08/24 1321) Pulse Rate: 60 (08/24 1321) Intake/Output from previous day: 08/23 0701 - 08/24 0700 In: 713.3 [P.O.:480; I.V.:160.3; IV Piggyback:73] Out: 4400 [Urine:4050; Stool:350] Intake/Output from this shift: Total I/O In: 360 [P.O.:360] Out: 1800 [Urine:1800]  Labs:  Recent Labs  12/31/13 0609 01/02/14 0540  WBC  --  6.7  HGB  --  12.4*  HCT  --  37.9*  PLT  --  320  CREATININE 0.58  --    Estimated Creatinine Clearance: 144.3 ml/min (by C-G formula based on Cr of 0.58).   Microbiology: Recent Results (from the past 720 hour(s))  CULTURE, BLOOD (ROUTINE X 2)     Status: None   Collection Time    12/25/13  2:48 PM      Result Value Ref Range Status   Specimen Description BLOOD LEFT ARM   Final   Special Requests BOTTLES DRAWN AEROBIC AND ANAEROBIC [place]   Final   Culture  Setup Time     Final   Value: 12/26/2013 00:56     Performed at [facility]   Culture     Final   Value: NO GROWTH 5 DAYS     Performed at [facility]   Report Status 01/01/2014 FINAL   Final  CULTURE, BLOOD (ROUTINE X 2)     Status: None   Collection Time    12/25/13  2:56 PM      Result Value Ref Range Status   Specimen Description BLOOD LEFT HAND   Final   Special Requests BOTTLES DRAWN AEROBIC ONLY 10CC   Final   Culture  Setup Time     Final   Value: 12/26/2013 00:56     Performed at [facility]   Culture     Final   Value: NO GROWTH 5 DAYS     Performed at [facility]   Report Status  01/01/2014 FINAL   Final  URINE CULTURE     Status: None   Collection Time    12/25/13 10:50 PM      Result Value Ref Range Status   Specimen Description URINE, CATHETERIZED   Final   Special Requests NONE   Final   Culture  Setup Time     Final   Value: 12/26/2013 10:06     Performed at [facility]     Final   Value: NO GROWTH     Performed at [facility]   Culture     Final   Value: NO GROWTH     Performed at [facility]   Report Status 12/27/2013 FINAL   Final  MRSA PCR SCREENING     Status: None   Collection Time    12/26/13  3:20 AM      Result Value Ref Range Status   MRSA by PCR NEGATIVE  NEGATIVE Final   Comment:            The GeneXpert MRSA Assay (FDA     approved  for NASAL specimens     only), is one component of a     comprehensive MRSA colonization     surveillance program. It is not     intended to diagnose MRSA     infection nor to guide or     monitor treatment for     MRSA infections.   Assessment: 32 yom initially presented to the hospital with abdominal pain.   AC: Lovenox for hx recurrent DVT (PTA xarelto - held for surgery) - CBC stable. no bleeding noted, dose appropriate (plan to resume xarelto when taking PO)  Neurology: Hx depression, seizures, neuropathy - keppra, phenytoin home doses - no seizure activity, A&O. 8/16 phenytoin = 5.6 (corr to 8.5 for 2.8 albumin - dose not adjusted since no seizures and may accumulate with IV dosing) 8/23 Phenytion = 6 (corrects to 9.1) - slightly low but no change d/t lack of seizure activity 8/24: Order to change phenytoin back to po  Goal of Therapy:  Total phenytoin = 10-20 Free phenytoin = 1-2  Plan:  1. Continue lovenox 130mg  SQ Q12H  2. CBC Q72H while on lovenox 3. F/u ability to restart xarelto 4. Continue phenytoin 100mg  QID on TTSS and 100mg  TID MWF (back on home po dose) 5. F/u seizure activity 6. F/u ability to restart Xarelto  Sherlon Handing, PharmD,  BCPS Clinical pharmacist, pager [phone] 01/02/2014,2:16 PM

## 2014-01-02 NOTE — Progress Notes (Signed)
PT Cancellation Note  Patient Details Name: ROXAS CLYMER MRN: [id number] DOB: 1951/02/21   Cancelled Treatment:    Reason Eval/Treat Not Completed: Patient declined, no reason specified; pt awaiting removal of central line and refused treatment today.   Faustino Congress K 01/02/2014, 11:13 AM  Laureen Abrahams, PT, DPT [phone]

## 2014-01-02 NOTE — Care Management Note (Signed)
  Page 1 of 1   01/02/2014     10:52:27 AM CARE MANAGEMENT NOTE 01/02/2014  Patient:  [name], [name]   Account Number:  [id number]  Date Initiated:  12/26/2013  Documentation initiated by:  MAYO,HENRIETTA  Subjective/Objective Assessment:   dx perforated viscus; resident of [facility] SNF for rehab    PCP  Merrilee Seashore     Action/Plan:   Anticipated DC Date:     Anticipated DC Plan:  [place] referral  Clinical Social Worker      DC Planning Services  CM consult      Choice offered to / List presented to:             Status of service:  In process, will continue to follow Medicare Important Message given?  YES (If response is "NO", the following Medicare IM given date fields will be blank) Date Medicare IM given:  12/28/2013 Medicare IM given by:  MAYO,HENRIETTA Date Additional Medicare IM given:  01/02/2014 Additional Medicare IM given by:  Magdalen Spatz  Discharge Disposition:    Per UR Regulation:  Reviewed for med. necessity/level of care/duration of stay  If discussed at [place] of Stay Meetings, dates discussed:    Comments:  Contact:  Macalister, Arnaud Sister [phone]   [phone]

## 2014-01-02 NOTE — Progress Notes (Signed)
Pt has been refusing ambulation from both the NT and I.  I will continue to encourage him to ambulate and explain the importance. Syliva Overman

## 2014-01-02 NOTE — Progress Notes (Addendum)
D/C Zosyn, resume Xarelto tomorrow. Patient examined and I agree with the assessment and plan  Georganna Skeans, MD, MPH, FACS Trauma: [phone] General Surgery: [phone]  01/02/2014 5:37 PM

## 2014-01-02 NOTE — Progress Notes (Signed)
Calorie Count Note  48 hour calorie count ordered.  Diet: Soft Diet  Supplements:  - Resource Breeze po TID, each supplement provides 250 kcal and 9 grams of protein -Ensure Complete po BID, each supplement provides 350 kcal and 13 grams of protein  8/20 Lunch: 140 kcal and 4 g protein Dinner: 261 kcal, 12 g protein Supplements: 1 and 1/3 Ensure Complete Drinks, 1 Resource breeze (705 kcal and 26 g protein)  8/21 Breakfast: Not recorded Lunch: 423 kcal and 32 g protein Supplements: 1 Resource breeze (250 calories, 9g protein) and 2 Ensure Complete (700 calories, 26g protein)  Average daily intake: 1240 calories (54% estimated calorie needs) 55g protein (44% estimated protein needs)    Met with pt who reports he's been consuming 2 Ensure Complete per day and 1 Resource Breeze per day with 100% meal intake. Meal intake over the weekend was 100% per RN documentation.    Nutrition Dx: Inadequate oral intake related to altered GI function as evidenced by PO intake 0% - resolved per pt report   Goal: Pt to meet >/= 90% of their estimated nutrition needs - not met but improving    Carlis Stable MS, RD, [place] Pager [phone] Weekend/After Hours Pager

## 2014-01-03 MED ORDER — POLYETHYLENE GLYCOL 3350 17 G PO PACK: 17.0000 g | PACK | Freq: Every day | ORAL | Status: DC | PRN

## 2014-01-03 MED ORDER — ENSURE COMPLETE PO LIQD: 237.0000 mL | Freq: Three times a day (TID) | ORAL | Status: DC

## 2014-01-03 MED ORDER — HYDROCORTISONE ACE-PRAMOXINE 2.5-1 % RE CREA: TOPICAL_CREAM | Freq: Two times a day (BID) | RECTAL | Status: DC

## 2014-01-03 MED ORDER — BOOST / RESOURCE BREEZE PO LIQD: 1.0000 | Freq: Three times a day (TID) | ORAL | Status: DC

## 2014-01-03 MED ORDER — HYDROCODONE-ACETAMINOPHEN 5-325 MG PO TABS: 1.0000 | ORAL_TABLET | Freq: Four times a day (QID) | ORAL | Status: DC | PRN

## 2014-01-03 NOTE — Discharge Summary (Signed)
Chayne Baumgart, MD, MPH, FACS Trauma: [phone] General Surgery: [phone]  

## 2014-01-03 NOTE — Progress Notes (Signed)
D/C to SNF Patient examined and I agree with the assessment and plan  Georganna Skeans, MD, MPH, FACS Trauma: [phone] General Surgery: [phone]  01/03/2014 12:44 PM

## 2014-01-03 NOTE — Consult Note (Addendum)
WOC wound consult note  Reason for Consult: Vac dressing changed with CCS PA at bedside to assess wound. Wound type: Full thickness midline post-op wound  Measurement: 21X8X4cm  Wound bed: 95% beefy red , 5% slough in corner of wound at 5:00 o'clock Drainage (amount, consistency, odor) Mod amt tan drainage in cannister Periwound: Intact skin surrounding.  Dressing procedure/placement/frequency: Applied two pieces of black foam to 130mm cont suction. Pt tolerated with minimal amt pain. Plan for bedside nurse to change Q T/Th/Sat  WOC ostomy consult note  Stoma type/location:  Pt has ileostomy to RLQ and mucous fistula to LLQ  Stomal assessment/size: Right ileostomy stoma red and viable, slightly above skin level, 1 1/2 inches.  Left mucous fistula red and viable, flush with skin level, 1 1/4 inches.  Peristomal assessment: Skin intact surrounding both stoma sites.  Output Mucous fistula with small amt tan stool. Ileostomy with 100cc liquid brown stool and current pouch is leaking.  Ostomy pouching: Applied 1 piece pouch to each stoma. Barrier ring applied to ileostomy stoma to maintain seal.  Pt able to assist with pouch application and opening and closing velcro closure to empty. Supplies at bedside for nurses.  Reviewed pouching routines and ordering supplies.  Placed on [place] discharge program.  Educational materials left at bedside.  No family members present for teaching session.  Pt plans to D/C to SNF where he will have assistance with pouch application and emptying.  Discussed dietary precautions.  Pt asked appropriate questions.   Julien Girt MSN, RN, [place], [place], [place]

## 2014-01-03 NOTE — Discharge Instructions (Signed)

## 2014-01-03 NOTE — Progress Notes (Signed)
Patient ID: [name], male   DOB: [birth year], 63 y.o.   MRN: [id number]     [address] [place], [place] [address]    Phone: [phone] FAX: [phone]     Subjective: No changes.  Awaiting placement.   Objective:  Vital signs:  Filed Vitals:   01/02/14 0548 01/02/14 1321 01/02/14 2235 01/03/14 0642  BP: 120/66 121/71 110/64 124/69  Pulse: 60 60 66 80  Temp: 98.1 F (36.7 C) 97.5 F (36.4 C) 97.4 F (36.3 C) 97.4 F (36.3 C)  TempSrc: Oral Oral Oral Oral  Resp: 17 18 16 17   Height:      Weight:      SpO2: 98% 96% 96% 96%    Last BM Date: 01/02/14 (via colostomy)  Intake/Output   Yesterday:  08/24 0701 - 08/25 0700 In: 027 [P.O.:818; I.V.:3] Out: 2536 [Urine:4125; Stool:350] This shift:    I/O last 3 completed shifts: In: 1136.3 [P.O.:1058; I.V.:78.3] Out: 6275 [Urine:5775; Stool:500]    Physical exam:  Gen: Alert, NAD, pleasant  Pulm: cta.  Cardio: s1s2 rrr, no murmurs gallops or rubs. 2/2 pulses, no edema.  Abd: Soft, NT/ND, +BS, no HSM, large midline wound is beefy red, adequate granulation tissue, ostomy patent with brown/green fluid and tons of flatus, mucus fistula pink with minimal output   Problem List:   Active Problems:   Free intraperitoneal air   Perforated viscus    Results:   Labs: Results for orders placed during the hospital encounter of 12/25/13 (from the past 48 hour(s))  CBC     Status: Abnormal   Collection Time    01/02/14  5:40 AM      Result Value Ref Range   WBC 6.7  4.0 - 10.5 K/uL   RBC 3.97 (*) 4.22 - 5.81 MIL/uL   Hemoglobin 12.4 (*) 13.0 - 17.0 g/dL   HCT 37.9 (*) 39.0 - 52.0 %   MCV 95.5  78.0 - 100.0 fL   MCH 31.2  26.0 - 34.0 pg   MCHC 32.7  30.0 - 36.0 g/dL   RDW 13.4  11.5 - 15.5 %   Platelets 320  150 - 400 K/uL    Imaging / Studies: No results found.  Medications / Allergies:  Scheduled Meds: . antiseptic oral rinse  7 mL  Mouth Rinse q12n4p  . bisacodyl  10 mg Rectal Daily  . chlorhexidine  15 mL Mouth Rinse BID  . enoxaparin (LOVENOX) injection  130 mg Subcutaneous Q12H  . feeding supplement (ENSURE COMPLETE)  237 mL Oral TID WC  . feeding supplement (RESOURCE BREEZE)  1 Container Oral TID BM  . FLUoxetine  20 mg Oral q morning - 10a  . gabapentin  600 mg Oral TID  . hydrocortisone-pramoxine   Rectal BID  . levETIRAcetam  1,500 mg Oral TID  . multivitamin with minerals  1 tablet Oral Daily  . pantoprazole (PROTONIX) IV  40 mg Intravenous QHS  . phenytoin  100 mg Oral 3 times per day on Mon Wed Fri  . phenytoin  100 mg Oral 4 times per day on Sun Tue Thu Sat  . sodium chloride  3 mL Intravenous Q12H  . tamsulosin  0.4 mg Oral Daily   Continuous Infusions: . dextrose 5 % and 0.45 % NaCl with KCl 20 mEq/L 10 mL/hr at 01/02/14 1349   PRN Meds:.acetaminophen, HYDROcodone-acetaminophen, HYDROmorphone (DILAUDID) injection, ondansetron, phenol, sodium chloride,  sodium chloride  Antibiotics: Anti-infectives   Start     Dose/Rate Route Frequency Ordered Stop   12/25/13 2200  piperacillin-tazobactam (ZOSYN) IVPB 3.375 g  Status:  Discontinued     3.375 g 12.5 mL/hr over 240 Minutes Intravenous Every 8 hours 12/25/13 1521 01/02/14 1738   12/25/13 1500  vancomycin (VANCOCIN) 1,250 mg in sodium chloride 0.9 % 250 mL IVPB  Status:  Discontinued     1,250 mg 166.7 mL/hr over 90 Minutes Intravenous  Once 12/25/13 1418 12/25/13 1914   12/25/13 1430  piperacillin-tazobactam (ZOSYN) IVPB 3.375 g     3.375 g 100 mL/hr over 30 Minutes Intravenous  Once 12/25/13 1418 12/25/13 1630        Assessment/Plan Perforated and ischemic transverse colon  POD #9 s/p Ex Lap Transvers and 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 Meets criteria for discharge  -DC to SNF when bed is available, SW on board.  -Ensure  TID BM  -SCD/lovenox  -VAC change T/TH/S  -insert peripheral line, DC central line(make take some time to find an available bed)  Erby Pian, [facility] Pager [phone](7A-4:30P) For consults and floor pages call [phone](7A-4:30P)  01/03/2014 9:56 AM

## 2014-01-03 NOTE — Discharge Summary (Signed)
Physician Discharge Summary  [name] KCL:[id number] DOB: [birth year] DOA: 12/25/2013  PCP: Merrilee Seashore, MD  Consultation: CCM   WOC  Admit date: 12/25/2013 Discharge date: 01/03/2014  Recommendations for Outpatient Follow-up:   Follow-up Information   Follow up with Reyes Ivan, MD On 02/16/2014. (arrive by 10:30AM for a 10:45 AM post operative check up)    Specialty:  General Surgery   Contact information:   [address] [place] [address] [phone]      Discharge Diagnoses:  1. Perforated and ischemic transverse colon 2. 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 Ralene Ok 12/25/13   Discharge Condition: stable Disposition: SNF  Diet recommendation: soft advance as tolerated   Filed Weights   12/25/13 1606 12/30/13 1820  Weight: 290 lb 6 oz (131.713 kg) 285 lb (129.275 kg)    Filed Vitals:   01/03/14 0642  BP: 124/69  Pulse: 80  Temp: 97.4 F (36.3 C)  Resp: 17    Filed Vitals:   01/03/14 0642  BP: 124/69  Pulse: 80  Temp: 97.4 F (36.3 C)  Resp: 17     Hospital Course:  [name] is a 63 year old male with a complex medical issues, who has been in SNF since April 2015 with fractured ankle, CAD, depression, PVD, seizures, DVT, BPH who presented with abdominal pain.  He has had issue with abdominal distension at least since June of this year. CT on 10/19/13 shows: Colon is diffusely distended by gas and a small amount of fluid to the level of the proximal rectum where wall thickening and associated perirectal infiltration are identified. Additional scattered edema in the presacral space and sigmoid mesocolon.  He saw Dr. Benson Norway and had a flexible Sigmoidoscopy. He had diffuse colonic distension with some spontaneous contractions, consistent with a colonic ileus. Mucosa  appeared normal. He place him on Amitza and discharged to SNF.  Work up in the ED; acute abdomen film with: Large amount of free intraperitoneal gas worrisome for ruptured bowel. Large and small bowel distension. His abdomen is massively distended, and tender.  He underwent an emergent ex lap proximal transverse and right coloectomy/ end ileostomy and mucus fistula.  Post operatively, he remained intubated.  He was extubated the following day.  He was started on sips of clears, but was not meeting his PO requirements and started on TNA.  His diet was advanced and appetite improved per calorie count.  Dietician recommended ensure and boost as a supplement.  I think some of his appetite issues are due to his depression.  His antidepressant was resumed on 8/24.  WOC was consulted for [facility] placement which he will be discharged with and will likely require for about 2 weeks.  His wound is clean and beefy red.  He has a mucus fistula and a ileotomy with adequate output.  The mucus fistula will hopefully close on its own soon.  He was kept on IV antibiotics for a total of 8 days.  He remained stable and afebrile.  He was mobilized with therapies, but often times refused.  He has a history of BPH, flomax was resumed on 8/24.  He will be discharged with a foley, may start voiding trial in 1-2 days.  I have stopped his Amitiza, follow up with Dr. Benson Norway to determine if needed.   I have arranged a follow up with  Dr. Rosendo Gros for the patient for a post op check, wound check.   Discharge Instructions     Medication List    STOP taking these medications       lubiprostone 24 MCG capsule  Commonly known as:  AMITIZA     traMADol 50 MG tablet  Commonly known as:  ULTRAM      TAKE these medications       acetaminophen 500 MG tablet  Commonly known as:  TYLENOL  Take 500 mg by mouth 2 (two) times daily as needed for moderate pain.     alum & mag hydroxide-simeth 200-200-20 MG/5ML suspension  Commonly known as:   MAALOX/MYLANTA  Take 30 mLs by mouth every 2 (two) hours as needed for indigestion or heartburn.     Calcium-Vitamin D 600-400 MG-UNIT Tabs  Take 1 tablet by mouth daily. For calcium supplement     docusate sodium 100 MG capsule  Commonly known as:  COLACE  Take 1 capsule (100 mg total) by mouth 2 (two) times daily.     feeding supplement (ENSURE COMPLETE) Liqd  Take 237 mLs by mouth 3 (three) times daily with meals.     feeding supplement (RESOURCE BREEZE) Liqd  Take 1 Container by mouth 3 (three) times daily between meals.     FLUoxetine 20 MG capsule  Commonly known as:  PROZAC  Take 20 mg by mouth every morning. For depression     gabapentin 600 MG tablet  Commonly known as:  NEURONTIN  Take 600 mg by mouth 3 (three) times daily.     HYDROcodone-acetaminophen 5-325 MG per tablet  Commonly known as:  NORCO/VICODIN  Take 1-2 tablets by mouth every 6 (six) hours as needed for moderate pain.     hydrocortisone-pramoxine 2.5-1 % rectal cream  Commonly known as:  ANALPRAM-HC  Place rectally 2 (two) times daily.     levETIRAcetam 750 MG tablet  Commonly known as:  KEPPRA  Take 1,500 mg by mouth 3 (three) times daily. For seizures     multivitamin with minerals Tabs tablet  Take 1 tablet by mouth daily.     phenytoin 100 MG ER capsule  Commonly known as:  DILANTIN  Take 100 mg by mouth See admin instructions. Give 1 tablet by mouth four times daily on Tuesday, Thursday, Saturday, and Sunday. Give 1 tablet by mouth three times daily on Monday, Wednesday, and Friday.     polyethylene glycol packet  Commonly known as:  MIRALAX / GLYCOLAX  Take 17 g by mouth daily as needed. For constipation     pravastatin 20 MG tablet  Commonly known as:  PRAVACHOL  Take 20 mg by mouth daily. For HLD     rivaroxaban 20 MG Tabs tablet  Commonly known as:  XARELTO  Take 20 mg by mouth daily with supper.     tamsulosin 0.4 MG Caps capsule  Commonly known as:  FLOMAX  Take 0.4 mg by  mouth daily. For BPH     vitamin B-12 1000 MCG tablet  Commonly known as:  CYANOCOBALAMIN  Take 1,000 mcg by mouth daily. For supplement           Follow-up Information   Follow up with Reyes Ivan, MD On 02/16/2014. (arrive by 10:30AM for a 10:45 AM post operative check up)    Specialty:  General Surgery   Contact information:   [address] [place] [address] [phone]        The results  of significant diagnostics from this hospitalization (including imaging, microbiology, ancillary and laboratory) are listed below for reference.    Significant Diagnostic Studies: Dg Chest Port 1 View  12/26/2013   CLINICAL DATA:  Hypoxia  EXAM: PORTABLE CHEST - 1 VIEW  COMPARISON:  December 25, 2013  FINDINGS: Endotracheal tube tip is 4.1 cm above the carina. Central catheter tip is in the superior vena cava. Nasogastric tube tip and side port are below the diaphragm. No pneumothorax. There is a right effusion with right base consolidation. Left lung is clear. Heart is upper normal in size with pulmonary vascularity within normal limits.  IMPRESSION: Tube and catheter positions as described without pneumothorax. Right base infiltrate with small right effusion. Left lung clear.   Electronically Signed   By: Lowella Grip M.D.   On: 12/26/2013 07:15   Dg Abd Acute W/chest  12/25/2013   CLINICAL DATA:  Abdominal pain  EXAM: ACUTE ABDOMEN SERIES (ABDOMEN 2 VIEW & CHEST 1 VIEW)  COMPARISON:  None.  FINDINGS: There is a large amount of free intraperitoneal gas. Distended loops of small and large bowel are scattered across the abdomen.  Low lung volumes.  Normal heart size.  IMPRESSION: Large amount of free intraperitoneal gas worrisome for ruptured bowel. Critical Value/emergent results were called by telephone at the time of interpretation on 12/25/2013 at 2:21 pm to Dr. Alvina Chou , who verbally acknowledged these results.   Electronically Signed   By: Maryclare Bean M.D.   On:  12/25/2013 14:21    Microbiology: Recent Results (from the past 240 hour(s))  CULTURE, BLOOD (ROUTINE X 2)     Status: None   Collection Time    12/25/13  2:48 PM      Result Value Ref Range Status   Specimen Description BLOOD LEFT ARM   Final   Special Requests BOTTLES DRAWN AEROBIC AND ANAEROBIC [place]   Final   Culture  Setup Time     Final   Value: 12/26/2013 00:56     Performed at [facility]   Culture     Final   Value: NO GROWTH 5 DAYS     Performed at [facility]   Report Status 01/01/2014 FINAL   Final  CULTURE, BLOOD (ROUTINE X 2)     Status: None   Collection Time    12/25/13  2:56 PM      Result Value Ref Range Status   Specimen Description BLOOD LEFT HAND   Final   Special Requests BOTTLES DRAWN AEROBIC ONLY 10CC   Final   Culture  Setup Time     Final   Value: 12/26/2013 00:56     Performed at [facility]   Culture     Final   Value: NO GROWTH 5 DAYS     Performed at [facility]   Report Status 01/01/2014 FINAL   Final  URINE CULTURE     Status: None   Collection Time    12/25/13 10:50 PM      Result Value Ref Range Status   Specimen Description URINE, CATHETERIZED   Final   Special Requests NONE   Final   Culture  Setup Time     Final   Value: 12/26/2013 10:06     Performed at [place]     Final   Value: NO GROWTH     Performed at [place]     Final  Value: NO GROWTH     Performed at [facility]   Report Status 12/27/2013 FINAL   Final  MRSA PCR SCREENING     Status: None   Collection Time    12/26/13  3:20 AM      Result Value Ref Range Status   MRSA by PCR NEGATIVE  NEGATIVE Final   Comment:            The GeneXpert MRSA Assay (FDA     approved for NASAL specimens     only), is one component of a     comprehensive MRSA colonization     surveillance program. It is not     intended to diagnose MRSA     infection nor to guide or     monitor  treatment for     MRSA infections.     Labs: Basic Metabolic Panel:  Recent Labs Lab 12/31/13 0609  NA 141  K 4.3  CL 105  CO2 25  GLUCOSE 116*  BUN 5*  CREATININE 0.58  CALCIUM 8.1*   Liver Function Tests: No results found for this basename: AST, ALT, ALKPHOS, BILITOT, PROT, ALBUMIN,  in the last 168 hours No results found for this basename: LIPASE, AMYLASE,  in the last 168 hours No results found for this basename: AMMONIA,  in the last 168 hours CBC:  Recent Labs Lab 12/30/13 0430 01/02/14 0540  WBC 6.7 6.7  HGB 12.3* 12.4*  HCT 35.9* 37.9*  MCV 91.3 95.5  PLT 276 320   Cardiac Enzymes: No results found for this basename: CKTOTAL, CKMB, CKMBINDEX, TROPONINI,  in the last 168 hours BNP: BNP (last 3 results)  Recent Labs  10/19/13 1630  PROBNP 79.0   CBG:  Recent Labs Lab 12/28/13 2103 12/29/13 0359 12/29/13 0728 12/29/13 1138  GLUCAP 101* 106* 107* 123*    Active Problems:   Free intraperitoneal air   Perforated viscus   Time coordinating discharge: <30 mins  Signed:  Jasmen Emrich, ANP-BC

## 2014-01-03 NOTE — Progress Notes (Signed)
Report called to [facility] and given to [employer] Patient ready for discharge and will be transferred by ambulance. All documents and discharge summary completed. Waiting for transport.

## 2014-01-17 ENCOUNTER — Encounter (INDEPENDENT_AMBULATORY_CARE_PROVIDER_SITE_OTHER): Payer: 59 | Admitting: General Surgery

## 2014-01-21 ENCOUNTER — Encounter (HOSPITAL_COMMUNITY): Payer: Self-pay | Admitting: Emergency Medicine

## 2014-01-21 ENCOUNTER — Emergency Department (HOSPITAL_COMMUNITY)
Admission: EM | Admit: 2014-01-21 | Discharge: 2014-01-21 | Disposition: A | Discharge status: Home / Self Care | Payer: Medicare Other | Attending: Emergency Medicine | Admitting: Emergency Medicine

## 2014-01-21 ENCOUNTER — Emergency Department (HOSPITAL_COMMUNITY): Payer: Medicare Other

## 2014-01-21 DIAGNOSIS — E785 Hyperlipidemia, unspecified: Secondary | ICD-10-CM | POA: Diagnosis not present

## 2014-01-21 DIAGNOSIS — F3289 Other specified depressive episodes: Secondary | ICD-10-CM | POA: Insufficient documentation

## 2014-01-21 DIAGNOSIS — Z9889 Other specified postprocedural states: Secondary | ICD-10-CM

## 2014-01-21 DIAGNOSIS — I739 Peripheral vascular disease, unspecified: Secondary | ICD-10-CM | POA: Diagnosis not present

## 2014-01-21 DIAGNOSIS — Z86718 Personal history of other venous thrombosis and embolism: Secondary | ICD-10-CM | POA: Diagnosis not present

## 2014-01-21 DIAGNOSIS — G609 Hereditary and idiopathic neuropathy, unspecified: Secondary | ICD-10-CM | POA: Diagnosis not present

## 2014-01-21 DIAGNOSIS — M25569 Pain in unspecified knee: Secondary | ICD-10-CM | POA: Diagnosis not present

## 2014-01-21 DIAGNOSIS — Z87891 Personal history of nicotine dependence: Secondary | ICD-10-CM | POA: Insufficient documentation

## 2014-01-21 DIAGNOSIS — Z934 Other artificial openings of gastrointestinal tract status: Secondary | ICD-10-CM | POA: Diagnosis not present

## 2014-01-21 DIAGNOSIS — M129 Arthropathy, unspecified: Secondary | ICD-10-CM | POA: Insufficient documentation

## 2014-01-21 DIAGNOSIS — N400 Enlarged prostate without lower urinary tract symptoms: Secondary | ICD-10-CM | POA: Insufficient documentation

## 2014-01-21 DIAGNOSIS — I2510 Atherosclerotic heart disease of native coronary artery without angina pectoris: Secondary | ICD-10-CM | POA: Diagnosis not present

## 2014-01-21 DIAGNOSIS — Z79899 Other long term (current) drug therapy: Secondary | ICD-10-CM | POA: Diagnosis not present

## 2014-01-21 DIAGNOSIS — Z872 Personal history of diseases of the skin and subcutaneous tissue: Secondary | ICD-10-CM | POA: Diagnosis not present

## 2014-01-21 DIAGNOSIS — M25562 Pain in left knee: Secondary | ICD-10-CM

## 2014-01-21 DIAGNOSIS — Z880 Allergy status to penicillin: Secondary | ICD-10-CM | POA: Insufficient documentation

## 2014-01-21 DIAGNOSIS — F329 Major depressive disorder, single episode, unspecified: Secondary | ICD-10-CM | POA: Insufficient documentation

## 2014-01-21 DIAGNOSIS — G40909 Epilepsy, unspecified, not intractable, without status epilepticus: Secondary | ICD-10-CM | POA: Diagnosis not present

## 2014-01-21 DIAGNOSIS — M25559 Pain in unspecified hip: Secondary | ICD-10-CM | POA: Insufficient documentation

## 2014-01-21 DIAGNOSIS — Z7901 Long term (current) use of anticoagulants: Secondary | ICD-10-CM | POA: Insufficient documentation

## 2014-01-21 DIAGNOSIS — M79609 Pain in unspecified limb: Secondary | ICD-10-CM | POA: Insufficient documentation

## 2014-01-21 LAB — COMPREHENSIVE METABOLIC PANEL
ALT: 23 U/L (ref 0–53)
AST: 16 U/L (ref 0–37)
Albumin: 2.8 g/dL — ABNORMAL LOW (ref 3.5–5.2)
Alkaline Phosphatase: 183 U/L — ABNORMAL HIGH (ref 39–117)
Anion gap: 13 (ref 5–15)
BUN: 11 mg/dL (ref 6–23)
CALCIUM: 9 mg/dL (ref 8.4–10.5)
CO2: 23 meq/L (ref 19–32)
Chloride: 102 mEq/L (ref 96–112)
Creatinine, Ser: 0.53 mg/dL (ref 0.50–1.35)
GFR calc Af Amer: >90 mL/min (ref >90)
GLUCOSE: 96 mg/dL (ref 70–99)
Potassium: 4.2 mEq/L (ref 3.7–5.3)
SODIUM: 138 meq/L (ref 137–147)
Total Bilirubin: 0.3 mg/dL (ref 0.3–1.2)
Total Protein: 6.8 g/dL (ref 6.0–8.3)

## 2014-01-21 LAB — URINALYSIS, ROUTINE W REFLEX MICROSCOPIC
BILIRUBIN URINE: NEGATIVE
GLUCOSE, UA: NEGATIVE mg/dL
KETONES UR: NEGATIVE mg/dL
Leukocytes, UA: NEGATIVE
Nitrite: NEGATIVE
Protein, ur: NEGATIVE mg/dL
Specific Gravity, Urine: 1.027 (ref 1.005–1.030)
Urobilinogen, UA: 0.2 mg/dL (ref 0.0–1.0)
pH: 5 (ref 5.0–8.0)

## 2014-01-21 LAB — CBC WITH DIFFERENTIAL/PLATELET
Basophils Absolute: 0 10*3/uL (ref 0.0–0.1)
Basophils Relative: 0 % (ref 0–1)
Eosinophils Absolute: 0.5 10*3/uL (ref 0.0–0.7)
Eosinophils Relative: 8 % — ABNORMAL HIGH (ref 0–5)
HEMATOCRIT: 45.3 % (ref 39.0–52.0)
HEMOGLOBIN: 15.2 g/dL (ref 13.0–17.0)
LYMPHS PCT: 15 % (ref 12–46)
Lymphs Abs: 1.1 10*3/uL (ref 0.7–4.0)
MCH: 30.7 pg (ref 26.0–34.0)
MCHC: 33.6 g/dL (ref 30.0–36.0)
MCV: 91.5 fL (ref 78.0–100.0)
MONO ABS: 0.8 10*3/uL (ref 0.1–1.0)
Monocytes Relative: 12 % (ref 3–12)
Neutro Abs: 4.6 10*3/uL (ref 1.7–7.7)
Neutrophils Relative %: 65 % (ref 43–77)
PLATELETS: 210 10*3/uL (ref 150–400)
RBC: 4.95 MIL/uL (ref 4.22–5.81)
RDW: 13.2 % (ref 11.5–15.5)
WBC: 7.1 10*3/uL (ref 4.0–10.5)

## 2014-01-21 LAB — I-STAT CG4 LACTIC ACID, ED: Lactic Acid, Venous: 0.82 mmol/L (ref 0.5–2.2)

## 2014-01-21 LAB — PROTIME-INR
INR: 1.18 (ref 0.00–1.49)
Prothrombin Time: 15 seconds (ref 11.6–15.2)

## 2014-01-21 LAB — URINE MICROSCOPIC-ADD ON

## 2014-01-21 MED ORDER — IOHEXOL 300 MG/ML  SOLN
80.0000 mL | Freq: Once | INTRAMUSCULAR | Status: AC | PRN
  Administered 2014-01-21: 80 mL via INTRAVENOUS

## 2014-01-21 MED ORDER — MORPHINE SULFATE 4 MG/ML IJ SOLN
4.0000 mg | Freq: Once | INTRAMUSCULAR | Status: AC
  Administered 2014-01-21: 4 mg via INTRAVENOUS
  Filled 2014-01-21: qty 1

## 2014-01-21 MED ORDER — TRAMADOL HCL 50 MG PO TABS: 50.0000 mg | ORAL_TABLET | Freq: Four times a day (QID) | ORAL | Status: DC | PRN

## 2014-01-21 MED ORDER — ONDANSETRON HCL 4 MG/2ML IJ SOLN
4.0000 mg | Freq: Once | INTRAMUSCULAR | Status: AC
  Administered 2014-01-21: 4 mg via INTRAVENOUS
  Filled 2014-01-21: qty 2

## 2014-01-21 MED ORDER — IOHEXOL 300 MG/ML  SOLN
25.0000 mL | INTRAMUSCULAR | Status: AC
  Administered 2014-01-21 (×2): 25 mL via ORAL

## 2014-01-21 MED ORDER — FLEET ENEMA 7-19 GM/118ML RE ENEM
1.0000 | ENEMA | Freq: Once | RECTAL | Status: AC
  Administered 2014-01-21: 1 via RECTAL
  Filled 2014-01-21: qty 1

## 2014-01-21 MED ORDER — FLEET ENEMA 7-19 GM/118ML RE ENEM: 1.0000 | ENEMA | Freq: Once | RECTAL | Status: DC

## 2014-01-21 NOTE — ED Notes (Signed)
PTAR present to trasnport pt to [facility]

## 2014-01-21 NOTE — ED Notes (Signed)
Pt from [facility]. States that he has chronic bilateral leg pain from venous stasis. States leg pain has increased in last two days and came to ER for further mgmt. Pt recently had colon surgery and has colostomy bag on right and left abdomen. Right bag leaking on arrival. Pt has a wound va that is making a duck sound and wants to know if that can be fixed while he is in the ER. Pt received 2 vicodin from the nursing home couple of hours prior to arrival with no relief. Rates pain 10/10 at this time.

## 2014-01-21 NOTE — ED Notes (Signed)
Family at bedside. 

## 2014-01-21 NOTE — Progress Notes (Signed)
VASCULAR LAB PRELIMINARY  PRELIMINARY  PRELIMINARY  PRELIMINARY  Bilateral lower extremity venous Dopplers completed.    Preliminary report:  There is no acute DVT or SVT noted in the bilateral lower extremities.  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  [name], RVT 01/21/2014, 9:02 AM

## 2014-01-21 NOTE — ED Notes (Signed)
Paged Wound RN. Pt's right ostomy bag continuing to leak. Ordered replacement bag

## 2014-01-21 NOTE — ED Notes (Addendum)
Gave report to Hoffman at [facility] that pt would be returning. PTAR has been called

## 2014-01-21 NOTE — ED Provider Notes (Signed)
CSN: [id number]     Arrival date & time 01/21/14  0557 History   First MD Initiated Contact with Patient 01/21/14 [phone]     Chief Complaint  Patient presents with  . Leg Pain   (Consider location/radiation/quality/duration/timing/severity/associated sxs/prior Treatment) HPI [name] is a 63 yo male with PMH: bowel perforation with wound vac, CAD, DVT, HLD and venous stasis, presenting to the ED with c/o left leg pain x 2 days.  Pt states he has had pain for a couple of days but denies any injury to the leg.  He describes it as a constant, achy pain that radiates from his left hip to his foot, worse when he is turned by the nursing facility staff, he rates the pain as 10/10.  He reports his leg is discolored, but this is due to his venous stasis and is unchanged  He denies fever, cough, unilateral leg swelling, redness or heat, shortness of breath, chest pain, abd pain, nausea or vomiting.  Past Medical History  Diagnosis Date  . Coronary artery disease   . Depression   . Peripheral vascular disease   . Seizures   . Deep vein thrombophlebitis of left leg   . Deep vein thrombosis of right lower extremity   . Psoriasis   . Arthritis   . DVT (deep venous thrombosis)     multiple times  . Weakness of both legs   . Peripheral neuropathy   . Venous stasis   . Hyperplasia of prostate   . BPH (benign prostatic hyperplasia)   . Hyperlipidemia    Past Surgical History  Procedure Laterality Date  . Colonoscopy  03/26/2012    Procedure: COLONOSCOPY;  Surgeon: Beryle Beams, MD;  Location: [facility];  Service: Endoscopy;  Laterality: N/A;  . Tonsillectomy      as child  . Flexible sigmoidoscopy N/A 12/09/2013    Procedure: FLEXIBLE SIGMOIDOSCOPY;  Surgeon: Beryle Beams, MD;  Location: [facility];  Service: Endoscopy;  Laterality: N/A;  . Laparotomy N/A 12/25/2013    Procedure: EXPLORATORY LAPAROTOMY/Transverse and right coloectomy/ end  ileostomy and mucus  fistula;  Surgeon:  Ralene Ok, MD;  Location: [facility] OR;  Service: General;  Laterality: N/A;   Family History  Problem Relation Age of Onset  . Vision loss Father   . Hypertension Father   . Diabetes Father   . Heart disease Father   . Cancer Father     colon  . Diabetes Mother   . Hypertension Mother   . Heart disease Mother   . Dementia Mother   . Cancer Mother     breast ca  . Diabetes Sister   . Hyperlipidemia Sister   . Hypertension Sister   . Diabetes Brother   . Kidney disease Brother   . Heart disease Brother    History  Substance Use Topics  . Smoking status: Former Smoker    Types: Pipe    Quit date: 03/26/1982  . Smokeless tobacco: Not on file  . Alcohol Use: No    Review of Systems  Constitutional: Negative for fever and chills.  HENT: Negative for sore throat.   Eyes: Negative for visual disturbance.  Respiratory: Negative for cough and shortness of breath.   Cardiovascular: Negative for chest pain and leg swelling.  Gastrointestinal: Negative for nausea, vomiting and diarrhea.  Genitourinary: Negative for dysuria.  Musculoskeletal: Positive for gait problem (has not walked for several months ). Negative for back pain and myalgias.  Skin: Negative  for rash.  Neurological: Negative for weakness, numbness and headaches.      Allergies  Penicillins  Home Medications   Prior to Admission medications   Medication Sig Start Date End Date Taking? Authorizing Provider  acetaminophen (TYLENOL) 500 MG tablet Take 500 mg by mouth 2 (two) times daily as needed for moderate pain.     Historical Provider, MD  alum & mag hydroxide-simeth (MAALOX/MYLANTA) 200-200-20 MG/5ML suspension Take 30 mLs by mouth every 2 (two) hours as needed for indigestion or heartburn.     Historical Provider, MD  Calcium Carb-Cholecalciferol (CALCIUM-VITAMIN D) 600-400 MG-UNIT TABS Take 1 tablet by mouth daily. For calcium supplement    Historical Provider, MD  docusate sodium (COLACE) 100 MG capsule  Take 1 capsule (100 mg total) by mouth 2 (two) times daily. 10/19/13   Carmin Muskrat, MD  feeding supplement, ENSURE COMPLETE, (ENSURE COMPLETE) LIQD Take 237 mLs by mouth 3 (three) times daily with meals. 01/03/14   Emina Riebock, NP  feeding supplement, RESOURCE BREEZE, (RESOURCE BREEZE) LIQD Take 1 Container by mouth 3 (three) times daily between meals. 01/03/14   Emina Riebock, NP  FLUoxetine (PROZAC) 20 MG capsule Take 20 mg by mouth every morning. For depression    Historical Provider, MD  gabapentin (NEURONTIN) 600 MG tablet Take 600 mg by mouth 3 (three) times daily.    Historical Provider, MD  HYDROcodone-acetaminophen (NORCO/VICODIN) 5-325 MG per tablet Take 1-2 tablets by mouth every 6 (six) hours as needed for moderate pain. 01/03/14   Emina Riebock, NP  hydrocortisone-pramoxine (ANALPRAM-HC) 2.5-1 % rectal cream Place rectally 2 (two) times daily. 01/03/14   Emina Riebock, NP  levETIRAcetam (KEPPRA) 750 MG tablet Take 1,500 mg by mouth 3 (three) times daily. For seizures    Historical Provider, MD  Multiple Vitamin (MULTIVITAMIN WITH MINERALS) TABS Take 1 tablet by mouth daily.    Historical Provider, MD  phenytoin (DILANTIN) 100 MG ER capsule Take 100 mg by mouth See admin instructions. Give 1 tablet by mouth four times daily on Tuesday, Thursday, Saturday, and Sunday. Give 1 tablet by mouth three times daily on Monday, Wednesday, and Friday.    Historical Provider, MD  polyethylene glycol (MIRALAX / GLYCOLAX) packet Take 17 g by mouth daily as needed. For constipation 01/03/14   Emina Riebock, NP  pravastatin (PRAVACHOL) 20 MG tablet Take 20 mg by mouth daily. For HLD    Historical Provider, MD  rivaroxaban (XARELTO) 20 MG TABS tablet Take 20 mg by mouth daily with supper.    Historical Provider, MD  tamsulosin (FLOMAX) 0.4 MG CAPS capsule Take 0.4 mg by mouth daily. For BPH 03/21/13   Historical Provider, MD  vitamin B-12 (CYANOCOBALAMIN) 1000 MCG tablet Take 1,000 mcg by mouth daily. For  supplement    Historical Provider, MD   BP 121/72  Pulse 67  Temp(Src) 97.5 F (36.4 C) (Oral)  Resp 12  SpO2 96% Physical Exam  Nursing note and vitals reviewed. Constitutional: He is oriented to person, place, and time. He appears well-developed and well-nourished. No distress.  HENT:  Head: Normocephalic and atraumatic.  Mouth/Throat: Oropharynx is clear and moist. No oropharyngeal exudate.  Eyes: Conjunctivae are normal.  Neck: Neck supple. No thyromegaly present.  Cardiovascular: Normal rate, regular rhythm and intact distal pulses.   Pulmonary/Chest: Effort normal and breath sounds normal. No respiratory distress. He has no wheezes. He has no rales. He exhibits no tenderness.  Abdominal: Soft. He exhibits no distension. There is no tenderness. There is  no rigidity, no rebound, no guarding, no tenderness at McBurney's point and negative Murphy's sign.    Musculoskeletal: He exhibits tenderness.       Left hip: He exhibits tenderness. He exhibits normal range of motion, normal strength and no swelling.       Left knee: He exhibits normal range of motion, no swelling, no effusion and no bony tenderness. Tenderness found.       Legs: Lymphadenopathy:    He has no cervical adenopathy.  Neurological: He is alert and oriented to person, place, and time.  Skin: Skin is warm and dry. No rash noted. He is not diaphoretic.     Psychiatric: He has a normal mood and affect.    ED Course  Procedures (including critical care time) Labs Review Labs Reviewed  CBC WITH DIFFERENTIAL - Abnormal; Notable for the following:    Eosinophils Relative 8 (*)    All other components within normal limits  COMPREHENSIVE METABOLIC PANEL - Abnormal; Notable for the following:    Albumin 2.8 (*)    Alkaline Phosphatase 183 (*)    All other components within normal limits  URINALYSIS, ROUTINE W REFLEX MICROSCOPIC - Abnormal; Notable for the following:    Color, Urine AMBER (*)    APPearance CLOUDY  (*)    Hgb urine dipstick MODERATE (*)    All other components within normal limits  CULTURE, BLOOD (ROUTINE X 2)  CULTURE, BLOOD (ROUTINE X 2)  PROTIME-INR  URINE MICROSCOPIC-ADD ON  I-STAT CG4 LACTIC ACID, ED    Imaging Review Dg Hip Complete Left  01/21/2014   CLINICAL DATA:  History of recent colon surgery, left hip pain  EXAM: LEFT HIP - COMPLETE 2+ VIEW  COMPARISON:  None.  FINDINGS: Pelvic bones are intact. Mild bilateral hip joint space narrowing. No evidence of femur fracture or dislocation.  IMPRESSION: No acute findings   Electronically Signed   By: Skipper Cliche M.D.   On: 01/21/2014 09:04   Ct Abdomen Pelvis W Contrast  01/21/2014   CLINICAL DATA:  Chronic bilateral leg pain from venous stasis. Increasing leg pain over the past 2 days.  EXAM: CT ABDOMEN AND PELVIS WITH CONTRAST  TECHNIQUE: Multidetector CT imaging of the abdomen and pelvis was performed using the standard protocol following bolus administration of intravenous contrast.  CONTRAST:  10mL OMNIPAQUE IOHEXOL 300 MG/ML  SOLN  COMPARISON:  CT of the abdomen and pelvis 10/19/2013.  FINDINGS: Lung Bases: Areas of scarring and/or atelectasis in the lung bases bilaterally. Small hiatal hernia.  Abdomen/Pelvis: Compared to the prior study there has been interval right hemicolectomy. 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 There appears to be extensive rectal and colonic wall thickening throughout the mucous fistula, which could indicate a proctocolitis. Additionally, there is a relatively large volume of residual stool within the mucous fistula. A right lower quadrant diverting ileostomy is noted. No significant volume of ascites. No pneumoperitoneum. No pathologic distention of small bowel. No lymphadenopathy noted in the abdomen or pelvis. Foley balloon catheter within the lumen of the urinary bladder which is nearly completely decompressed. Small amount of gas in the nondependent portion of the urinary  bladder is presumably iatrogenic.  There appears to be some mild periportal edema in the liver, most evident in the right lobe of the liver. No focal hepatic lesions are noted. 4 mm calcified gallstone in the neck of the gallbladder. No current findings to suggest acute cholecystitis at this time. The appearance  of the pancreas, bilateral adrenal glands and the left kidney is unremarkable. Sub cm low-attenuation lesion in the lateral aspect of the upper pole of the right kidney is too small to characterize, but only slightly larger than remote prior study from 2009, presumably a small cyst. The spleen is enlarged measuring 15.0 x 8.4 x 11.2 cm (estimated splenic volume of 769 mL). There is again a well-defined low-attenuation lesion in the spleen measuring 6.5 cm, similar to the recent prior examination, presumably a cyst. Mild atherosclerosis throughout the abdominal and pelvic vasculature. In addition, in the left common femoral vein extending retrograde into the left external iliac vein there is some linear calcification, which could indicate chronic calcified thrombus. The left external iliac vein is diminutive in caliber in the areas of calcification, which could indicate chronic conclusion.  Musculoskeletal: There are no aggressive appearing lytic or blastic lesions noted in the visualized portions of the skeleton.  IMPRESSION: 1. 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 Throughout the mucous fistula there is diffuse rectal and colonic wall thickening, which may suggest a proctocolitis. There is also a relatively large amount of retained stool within the mucous fistula. 2. Mild periportal edema in the liver, most evident in the right lobe of the liver. This is nonspecific, but correlation with liver function tests is recommended. 3. Splenomegaly. 4. 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 Given the small size of the left external iliac vein, this may be chronically occluded. 5. Cholelithiasis without evidence to suggest acute cholecystitis at this time. 6. Additional incidental findings, as above.   Electronically Signed   By: Vinnie Langton M.D.   On: 01/21/2014 12:56   Dg Knee Complete 4 Views Left  01/21/2014   CLINICAL DATA:  Severe left knee pain  EXAM: LEFT KNEE - COMPLETE 4+ VIEW  COMPARISON:  None.  FINDINGS: Mild medial joint space narrowing with no significant osteophyte formation. No fracture or dislocation. No evidence of joint effusion.  IMPRESSION: No acute findings   Electronically Signed   By: Skipper Cliche M.D.   On: 01/21/2014 09:05   Dg Abd Acute W/chest  01/21/2014   CLINICAL DATA:  Most of colon was resected surgically 12/25/2013, patient has colostomy, severe left knee pain and hip pain  EXAM: ACUTE ABDOMEN SERIES (ABDOMEN 2 VIEW & CHEST 1 VIEW)  COMPARISON:  12/26/2013  FINDINGS: Mild cardiac enlargement exaggerated by limited inspiratory effect. Minimal atelectasis right lung base. Lungs otherwise clear. Pulmonary vascular pattern within normal limits.  No evidence of free air. Minimal gas in the abdomen with no abnormally dilated loops of bowel. There is a 5 cm left upper quadrant focal opacity of unknown significance or origin. This is not seen on prior studies.  IMPRESSION: No acute cardiopulmonary process.  Nonobstructive gas pattern.  Abnormal opacity of uncertain origin over the left upper quadrant. Recommend CT abdomen and pelvis to further evaluate.   Electronically Signed   By: Skipper Cliche M.D.   On: 01/21/2014 08:57     EKG Interpretation None      MDM   Final diagnoses:  Left knee pain  H/O abdominal surgery   63 yo male with non-traumatic left knee pain  Consider deconditioned musculoskeletal pain but will rule out fracture and worsening process related to recent abd surgery.     CBC, CMP, lactic acid,  PT-INR, UA, and Chest,  abd, hip and knee films ordered.  [place] of left extremity, no evidence of DVT.  CT with contrast of abd.  Surgery consulted, evaluated pt and ordered enema and discharge.  Pain managed in the ED and pt updated throughout ED course with results.  Pt appears safe to be discharged back to nsg facility.  Discharge instructions include prescription for Tramadol, instructions to follow up with managing provider regarding knee pain and leaking ostomy.  Symptomatic management of knee pain discussed. Family and pt aware of plan and in agreement.  Return precautions provided.  Case discussed with Dr. Wyvonnia Dusky who is in agreement with discharge.  Filed Vitals:   01/21/14 1445 01/21/14 1515 01/21/14 1530 01/21/14 1543  BP: 124/68 122/71 121/76   Pulse: 68  70   Temp:    98 F (36.7 C)  TempSrc:    Oral  Resp: 10 11 13    SpO2: 95% 95% 96%    Meds given in ED:  Medications  morphine 4 MG/ML injection 4 mg (4 mg Intravenous Given 01/21/14 0926)  iohexol (OMNIPAQUE) 300 MG/ML solution 25 mL (25 mLs Oral Contrast Given 01/21/14 1114)  morphine 4 MG/ML injection 4 mg (4 mg Intravenous Given 01/21/14 1138)  ondansetron (ZOFRAN) injection 4 mg (4 mg Intravenous Given 01/21/14 1138)  iohexol (OMNIPAQUE) 300 MG/ML solution 80 mL (80 mLs Intravenous Contrast Given 01/21/14 1203)  sodium phosphate (FLEET) 7-19 GM/118ML enema 1 enema (1 enema Rectal Given 01/21/14 1402)    Discharge Medication List as of 01/21/2014  3:15 PM    START taking these medications   Details  traMADol (ULTRAM) 50 MG tablet Take 1 tablet (50 mg total) by mouth every 6 (six) hours as needed., Starting 01/21/2014, Until Discontinued, Print           Britt Bottom, NP 01/24/14 1137

## 2014-01-21 NOTE — Progress Notes (Signed)
Patient ID: [name], male   DOB: 06/07/1950, 63 y.o.   MRN: [id number]     [facility]      [address] [place], [place] [address]    Phone: [phone] FAX: [phone]     Subjective: We have been asked to evaluate [name] for complaints of abdominal pain.  He is well known to our service from this past August following a perforated and ischemic transverse colon.  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 Ralene Ok 12/25/13.  The patient denies abdominal pain.  His main concern is the leaking mucus fistula, nursing having to frequently change it.  His appetite is adequate.  Has stool output from ileostomy, but admits it is hard.  He reports that he has not been getting any stool softeners due to the output from the mucus fistula.  He denies fever or chills.  A CT of abdomen and pelvis noted rectal and colonic thickening, large retained stool within the mucus fistula.   Objective:  Vital signs:  Filed Vitals:   01/21/14 1147 01/21/14 1245 01/21/14 1300 01/21/14 1315  BP: 122/77 111/69 128/74 111/73  Pulse:  63 64 63  Temp: 97.6 F (36.4 C)     TempSrc: Oral     Resp: 14 12 14 13   SpO2: 95% 93% 98% 96%       Intake/Output   Yesterday:    This shift:     Physical Exam: General: Pt awake/alert/oriented x4 in no acute distress Abdomen: Soft.  Nondistended.  VAC in place, surrounding skin is c/d/i.  Right sided mucus fistula with yellow/fecal output.  LLQ ileostomy with solid/hard stool.  Stomas are pink and viable.  No evidence of peritonitis.  No incarcerated hernias.   Problem List:   Active Problems:   * No active hospital problems. *    Results:   Labs: Results for orders placed during the hospital encounter of 01/21/14 (from the past 48 hour(s))  CBC WITH DIFFERENTIAL     Status: Abnormal   Collection Time    01/21/14  7:30 AM       Result Value Ref Range   WBC 7.1  4.0 - 10.5 K/uL   RBC 4.95  4.22 - 5.81 MIL/uL   Hemoglobin 15.2  13.0 - 17.0 g/dL   HCT 45.3  39.0 - 52.0 %   MCV 91.5  78.0 - 100.0 fL   MCH 30.7  26.0 - 34.0 pg   MCHC 33.6  30.0 - 36.0 g/dL   RDW 13.2  11.5 - 15.5 %   Platelets 210  150 - 400 K/uL   Neutrophils Relative % 65  43 - 77 %   Neutro Abs 4.6  1.7 - 7.7 K/uL   Lymphocytes Relative 15  12 - 46 %   Lymphs Abs 1.1  0.7 - 4.0 K/uL   Monocytes Relative 12  3 - 12 %   Monocytes Absolute 0.8  0.1 - 1.0 K/uL   Eosinophils Relative 8 (*) 0 - 5 %   Eosinophils Absolute 0.5  0.0 - 0.7 K/uL   Basophils Relative 0  0 - 1 %   Basophils Absolute 0.0  0.0 - 0.1 K/uL  COMPREHENSIVE METABOLIC PANEL     Status: Abnormal   Collection Time    01/21/14  7:30 AM      Result Value Ref Range   Sodium 138  137 - 147 mEq/L   Potassium 4.2  3.7 - 5.3 mEq/L   Chloride 102  96 - 112 mEq/L   CO2 23  19 - 32 mEq/L   Glucose, Bld 96  70 - 99 mg/dL   BUN 11  6 - 23 mg/dL   Creatinine, Ser 0.53  0.50 - 1.35 mg/dL   Calcium 9.0  8.4 - 10.5 mg/dL   Total Protein 6.8  6.0 - 8.3 g/dL   Albumin 2.8 (*) 3.5 - 5.2 g/dL   AST 16  0 - 37 U/L   ALT 23  0 - 53 U/L   Alkaline Phosphatase 183 (*) 39 - 117 U/L   Total Bilirubin 0.3  0.3 - 1.2 mg/dL   GFR calc non Af Amer >90  >90 mL/min   GFR calc Af Amer >90  >90 mL/min   Comment: (NOTE)     The eGFR has been calculated using the CKD EPI equation.     This calculation has not been validated in all clinical situations.     eGFR's persistently <90 mL/min signify possible Chronic Kidney     Disease.   Anion gap 13  5 - 15  PROTIME-INR     Status: None   Collection Time    01/21/14  7:30 AM      Result Value Ref Range   Prothrombin Time 15.0  11.6 - 15.2 seconds   INR 1.18  0.00 - 1.49  URINALYSIS, ROUTINE W REFLEX MICROSCOPIC     Status: Abnormal   Collection Time    01/21/14  7:42 AM      Result Value Ref Range   Color, Urine AMBER (*) YELLOW   Comment:  BIOCHEMICALS MAY BE AFFECTED BY COLOR   APPearance CLOUDY (*) CLEAR   Specific Gravity, Urine 1.027  1.005 - 1.030   pH 5.0  5.0 - 8.0   Glucose, UA NEGATIVE  NEGATIVE mg/dL   Hgb urine dipstick MODERATE (*) NEGATIVE   Bilirubin Urine NEGATIVE  NEGATIVE   Ketones, ur NEGATIVE  NEGATIVE mg/dL   Protein, ur NEGATIVE  NEGATIVE mg/dL   Urobilinogen, UA 0.2  0.0 - 1.0 mg/dL   Nitrite NEGATIVE  NEGATIVE   Leukocytes, UA NEGATIVE  NEGATIVE  URINE MICROSCOPIC-ADD ON     Status: None   Collection Time    01/21/14  7:42 AM      Result Value Ref Range   Squamous Epithelial / LPF RARE  RARE   RBC / HPF 3-6  <3 RBC/hpf   Urine-Other MUCOUS PRESENT    I-STAT CG4 LACTIC ACID, ED     Status: None   Collection Time    01/21/14  7:43 AM      Result Value Ref Range   Lactic Acid, Venous 0.82  0.5 - 2.2 mmol/L    Imaging / Studies: Dg Hip Complete Left  01/21/2014   CLINICAL DATA:  History of recent colon surgery, left hip pain  EXAM: LEFT HIP - COMPLETE 2+ VIEW  COMPARISON:  None.  FINDINGS: Pelvic bones are intact. Mild bilateral hip joint space narrowing. No evidence of femur fracture or dislocation.  IMPRESSION: No acute findings   Electronically Signed   By: Skipper Cliche M.D.   On: 01/21/2014 09:04   Ct Abdomen Pelvis W Contrast  01/21/2014   CLINICAL DATA:  Chronic bilateral leg pain from venous stasis. Increasing leg pain over the past 2 days.  EXAM: CT ABDOMEN AND PELVIS WITH CONTRAST  TECHNIQUE:  Multidetector CT imaging of the abdomen and pelvis was performed using the standard protocol following bolus administration of intravenous contrast.  CONTRAST:  53m OMNIPAQUE IOHEXOL 300 MG/ML  SOLN  COMPARISON:  CT of the abdomen and pelvis 10/19/2013.  FINDINGS: Lung Bases: Areas of scarring and/or atelectasis in the lung bases bilaterally. Small hiatal hernia.  Abdomen/Pelvis: Compared to the prior study there has been interval right hemicolectomy. There is a long mucous fistula exiting the left  upper quadrant. There appears to be extensive rectal and colonic wall thickening throughout the mucous fistula, which could indicate a proctocolitis. Additionally, there is a relatively large volume of residual stool within the mucous fistula. A right lower quadrant diverting ileostomy is noted. No significant volume of ascites. No pneumoperitoneum. No pathologic distention of small bowel. No lymphadenopathy noted in the abdomen or pelvis. Foley balloon catheter within the lumen of the urinary bladder which is nearly completely decompressed. Small amount of gas in the nondependent portion of the urinary bladder is presumably iatrogenic.  There appears to be some mild periportal edema in the liver, most evident in the right lobe of the liver. No focal hepatic lesions are noted. 4 mm calcified gallstone in the neck of the gallbladder. No current findings to suggest acute cholecystitis at this time. The appearance of the pancreas, bilateral adrenal glands and the left kidney is unremarkable. Sub cm low-attenuation lesion in the lateral aspect of the upper pole of the right kidney is too small to characterize, but only slightly larger than remote prior study from 2009, presumably a small cyst. The spleen is enlarged measuring 15.0 x 8.4 x 11.2 cm (estimated splenic volume of 769 mL). There is again a well-defined low-attenuation lesion in the spleen measuring 6.5 cm, similar to the recent prior examination, presumably a cyst. Mild atherosclerosis throughout the abdominal and pelvic vasculature. In addition, in the left common femoral vein extending retrograde into the left external iliac vein there is some linear calcification, which could indicate chronic calcified thrombus. The left external iliac vein is diminutive in caliber in the areas of calcification, which could indicate chronic conclusion.  Musculoskeletal: There are no aggressive appearing lytic or blastic lesions noted in the visualized portions of the  skeleton.  IMPRESSION: 1. 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 Throughout the mucous fistula there is diffuse rectal and colonic wall thickening, which may suggest a proctocolitis. There is also a relatively large amount of retained stool within the mucous fistula. 2. Mild periportal edema in the liver, most evident in the right lobe of the liver. This is nonspecific, but correlation with liver function tests is recommended. 3. Splenomegaly. 4. 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 Given the small size of the left external iliac vein, this may be chronically occluded. 5. Cholelithiasis without evidence to suggest acute cholecystitis at this time. 6. Additional incidental findings, as above.   Electronically Signed   By: DVinnie LangtonM.D.   On: 01/21/2014 12:56   Dg Knee Complete 4 Views Left  01/21/2014   CLINICAL DATA:  Severe left knee pain  EXAM: LEFT KNEE - COMPLETE 4+ VIEW  COMPARISON:  None.  FINDINGS: Mild medial joint space narrowing with no significant osteophyte formation. No fracture or dislocation. No evidence of joint effusion.  IMPRESSION: No acute findings   Electronically Signed   By: RSkipper ClicheM.D.   On:  01/21/2014 09:05   Dg Abd Acute W/chest  01/21/2014   CLINICAL DATA:  Most of colon was resected surgically 12/25/2013, patient has colostomy, severe left knee pain and hip pain  EXAM: ACUTE ABDOMEN SERIES (ABDOMEN 2 VIEW & CHEST 1 VIEW)  COMPARISON:  12/26/2013  FINDINGS: Mild cardiac enlargement exaggerated by limited inspiratory effect. Minimal atelectasis right lung base. Lungs otherwise clear. Pulmonary vascular pattern within normal limits.  No evidence of free air. Minimal gas in the abdomen with no abnormally dilated loops of bowel. There is a 5 cm left upper quadrant focal opacity of unknown significance or origin. This is  not seen on prior studies.  IMPRESSION: No acute cardiopulmonary process.  Nonobstructive gas pattern.  Abnormal opacity of uncertain origin over the left upper quadrant. Recommend CT abdomen and pelvis to further evaluate.   Electronically Signed   By: Skipper Cliche M.D.   On: 01/21/2014 08:57    Medications / Allergies:  Scheduled Meds: . sodium phosphate  1 enema Rectal Once   Continuous Infusions:  PRN Meds:.  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 Ralene Ok 12/25/13  The patient does not appear acutely ill.  Large amount of stool within the mucus fistula.  Give enema.  He needs stool softeners, increase oral fluid consumption.  I will place an order for WOC to evaluate the mucus fistula as the nurses are having a difficult time maintaining a seal.  After this, he may be discharged back to SNF from surgical standpoint.  He needs to follow up with Dr. Rosendo Gros.  He does not need antibiotics for the CT findings with a benign abdominal exam.  I will have our office call him on Monday to schedule a follow up.    Erby Pian, [facility] Pager [phone]) For consults and floor pages call [phone](7A-[id number])  01/21/2014 1:45 PM.

## 2014-01-21 NOTE — Progress Notes (Signed)
Due to recent surgery (last month) pt needs full po contrast.

## 2014-01-21 NOTE — ED Notes (Signed)
Replaced leaking ostomy bag on pt's right side of abd

## 2014-01-21 NOTE — Discharge Instructions (Signed)
Please follow the directions provided.  Please follow up with your primary care provider regarding your leg pain and your ostomy concerns.  Your labs and imaging are reassuring there is nothing significantly concerning causing your pain.    SEEK MEDICAL ATTENTION: For any worsening pain not relived by medicines, Chest pain, shortness of breath, muscle cramps, Continued high output from ostomy Fevers, chills, nausea, vomiting and inability to keep down oral fluids.

## 2014-01-21 NOTE — ED Provider Notes (Signed)
Medical screening examination/treatment/procedure(s) were conducted as a shared visit with non-physician practitioner(s) and myself.  I personally evaluated the patient during the encounter.   2 day history of left knee and hip pain. Denies trauma. Pain with left knee movement. No back pain. No fever. Recent hospitalization for perforated bowel. No vomiting. Has not ambulated since April when he had ankle fracture which has since healed. Denies any back pain, focal weakness, numbness or tingling. Exam shows venous stasis changes of the left shin. Intact DP and PT pulse. No calf tenderness. Full range of motion of left hip and knee with mild discomfort. No appreciable effusion. No evidence of septic joint. Doppler negative for acute DVT.  Patient also concerned with poor seal and drainage from R mucus fistula.  Ostomy on R, midline abdominal wound with vac in place.  States no change in abdominal pain.  No vomiting or fever. CT results d.w surgery who evaluated patient.  BP 121/76  Pulse 70  Temp(Src) 98 F (36.7 C) (Oral)  Resp 13  SpO2 96%    EKG Interpretation None        Ezequiel Essex, MD 01/21/14 1758

## 2014-01-21 NOTE — ED Notes (Signed)
Pt still in xray/vascular

## 2014-01-21 NOTE — ED Notes (Signed)
Changed left ostomy bag and placed enema in left ostomy per verbal order from MD.

## 2014-01-21 NOTE — ED Notes (Signed)
Pt finished contrast. Notified CT 

## 2014-01-21 NOTE — ED Notes (Signed)
Called [facility] for transportation back home

## 2014-01-24 NOTE — ED Provider Notes (Signed)
Medical screening examination/treatment/procedure(s) were conducted as a shared visit with non-physician practitioner(s) and myself.  I personally evaluated the patient during the encounter.  See my additional note   EKG Interpretation None        Ezequiel Essex, MD 01/24/14 [phone]

## 2014-01-25 ENCOUNTER — Encounter (HOSPITAL_COMMUNITY): Payer: Self-pay | Admitting: Emergency Medicine

## 2014-01-25 ENCOUNTER — Emergency Department (HOSPITAL_COMMUNITY)
Admission: EM | Admit: 2014-01-25 | Discharge: 2014-01-25 | Disposition: A | Discharge status: Home / Self Care | Payer: Medicare Other | Attending: Emergency Medicine | Admitting: Emergency Medicine

## 2014-01-25 DIAGNOSIS — Y846 Urinary catheterization as the cause of abnormal reaction of the patient, or of later complication, without mention of misadventure at the time of the procedure: Secondary | ICD-10-CM | POA: Insufficient documentation

## 2014-01-25 DIAGNOSIS — Z86718 Personal history of other venous thrombosis and embolism: Secondary | ICD-10-CM | POA: Diagnosis not present

## 2014-01-25 DIAGNOSIS — I739 Peripheral vascular disease, unspecified: Secondary | ICD-10-CM | POA: Diagnosis not present

## 2014-01-25 DIAGNOSIS — I2510 Atherosclerotic heart disease of native coronary artery without angina pectoris: Secondary | ICD-10-CM | POA: Insufficient documentation

## 2014-01-25 DIAGNOSIS — Z7901 Long term (current) use of anticoagulants: Secondary | ICD-10-CM | POA: Diagnosis not present

## 2014-01-25 DIAGNOSIS — Z933 Colostomy status: Secondary | ICD-10-CM | POA: Diagnosis not present

## 2014-01-25 DIAGNOSIS — M129 Arthropathy, unspecified: Secondary | ICD-10-CM | POA: Insufficient documentation

## 2014-01-25 DIAGNOSIS — N400 Enlarged prostate without lower urinary tract symptoms: Secondary | ICD-10-CM | POA: Diagnosis not present

## 2014-01-25 DIAGNOSIS — Z8672 Personal history of thrombophlebitis: Secondary | ICD-10-CM | POA: Insufficient documentation

## 2014-01-25 DIAGNOSIS — E785 Hyperlipidemia, unspecified: Secondary | ICD-10-CM | POA: Diagnosis not present

## 2014-01-25 DIAGNOSIS — F3289 Other specified depressive episodes: Secondary | ICD-10-CM | POA: Insufficient documentation

## 2014-01-25 DIAGNOSIS — F329 Major depressive disorder, single episode, unspecified: Secondary | ICD-10-CM | POA: Diagnosis not present

## 2014-01-25 DIAGNOSIS — Z79899 Other long term (current) drug therapy: Secondary | ICD-10-CM | POA: Insufficient documentation

## 2014-01-25 DIAGNOSIS — Z880 Allergy status to penicillin: Secondary | ICD-10-CM | POA: Diagnosis not present

## 2014-01-25 DIAGNOSIS — T83091A Other mechanical complication of indwelling urethral catheter, initial encounter: Secondary | ICD-10-CM | POA: Insufficient documentation

## 2014-01-25 DIAGNOSIS — G40909 Epilepsy, unspecified, not intractable, without status epilepticus: Secondary | ICD-10-CM | POA: Diagnosis not present

## 2014-01-25 DIAGNOSIS — N489 Disorder of penis, unspecified: Secondary | ICD-10-CM | POA: Diagnosis present

## 2014-01-25 DIAGNOSIS — Z87891 Personal history of nicotine dependence: Secondary | ICD-10-CM | POA: Diagnosis not present

## 2014-01-25 DIAGNOSIS — G609 Hereditary and idiopathic neuropathy, unspecified: Secondary | ICD-10-CM | POA: Diagnosis not present

## 2014-01-25 DIAGNOSIS — Z872 Personal history of diseases of the skin and subcutaneous tissue: Secondary | ICD-10-CM | POA: Insufficient documentation

## 2014-01-25 LAB — URINALYSIS, ROUTINE W REFLEX MICROSCOPIC
Bilirubin Urine: NEGATIVE
Glucose, UA: NEGATIVE mg/dL
Ketones, ur: NEGATIVE mg/dL
NITRITE: NEGATIVE
PH: 5 (ref 5.0–8.0)
Protein, ur: 30 mg/dL — AB
SPECIFIC GRAVITY, URINE: 1.025 (ref 1.005–1.030)
Urobilinogen, UA: 0.2 mg/dL (ref 0.0–1.0)

## 2014-01-25 LAB — URINE MICROSCOPIC-ADD ON

## 2014-01-25 MED ORDER — HYDROMORPHONE HCL PF 1 MG/ML IJ SOLN
1.0000 mg | Freq: Once | INTRAMUSCULAR | Status: AC
  Administered 2014-01-25: 1 mg via INTRAVENOUS
  Filled 2014-01-25: qty 1

## 2014-01-25 MED ORDER — HYDROMORPHONE HCL PF 1 MG/ML IJ SOLN
1.0000 mg | Freq: Once | INTRAMUSCULAR | Status: AC
  Administered 2014-01-25: 1 mg via INTRAVENOUS

## 2014-01-25 MED ORDER — HYDROMORPHONE HCL PF 1 MG/ML IJ SOLN
2.0000 mg | Freq: Once | INTRAMUSCULAR | Status: DC
  Filled 2014-01-25: qty 2

## 2014-01-25 NOTE — ED Notes (Signed)
PER EMS: pt from [facility], reports 10/10 penis pain that started yesterday, has foley catheter and EMS reports blood in foley catheter tubing.  Pt also had colon surgery due to a ruptured colon on Sept 14, 2015, 2 colostomy bags and one wound vac to center of abdomen. Pt A&Ox4. BP-162/94, HR-80, O2-97% RA. Nursing home administered three 25mg  oxycodones at 3 am with no relief.

## 2014-01-25 NOTE — Discharge Instructions (Signed)
Foley Catheter Care °A Foley catheter is a soft, flexible tube that is placed into the bladder to drain urine. A Foley catheter may be inserted if: °· You leak urine or are not able to control when you urinate (urinary incontinence). °· You are not able to urinate when you need to (urinary retention). °· You had prostate surgery or surgery on the genitals. °· You have certain medical conditions, such as multiple sclerosis, dementia, or a spinal cord injury. °If you are going home with a Foley catheter in place, follow the instructions below. °TAKING CARE OF THE CATHETER °1. Wash your hands with soap and water. °2. Using mild soap and warm water on a clean washcloth: °· Clean the area on your body closest to the catheter insertion site using a circular motion, moving away from the catheter. Never wipe toward the catheter because this could sweep bacteria up into the urethra and cause infection. °· Remove all traces of soap. Pat the area dry with a clean towel. For males, reposition the foreskin. °3. Attach the catheter to your leg so there is no tension on the catheter. Use adhesive tape or a leg strap. If you are using adhesive tape, remove any sticky residue left behind by the previous tape you used. °4. Keep the drainage bag below the level of the bladder, but keep it off the floor. °5. Check throughout the day to be sure the catheter is working and urine is draining freely. Make sure the tubing does not become kinked. °6. Do not pull on the catheter or try to remove it. Pulling could damage internal tissues. °TAKING CARE OF THE DRAINAGE BAGS °You will be given two drainage bags to take home. One is a large overnight drainage bag, and the other is a smaller leg bag that fits underneath clothing. You may wear the overnight bag at any time, but you should never wear the smaller leg bag at night. Follow the instructions below for how to empty, change, and clean your drainage bags. °Emptying the Drainage Bag °You must  empty your drainage bag when it is  -½ full or at least 2-3 times a day. °1. Wash your hands with soap and water. °2. Keep the drainage bag below your hips, below the level of your bladder. This stops urine from going back into the tubing and into your bladder. °3. Hold the dirty bag over the toilet or a clean container. °4. Open the pour spout at the bottom of the bag and empty the urine into the toilet or container. Do not let the pour spout touch the toilet, container, or any other surface. Doing so can place bacteria on the bag, which can cause an infection. °5. Clean the pour spout with a gauze pad or cotton ball that has rubbing alcohol on it. °6. Close the pour spout. °7. Attach the bag to your leg with adhesive tape or a leg strap. °8. Wash your hands well. °Changing the Drainage Bag °Change your drainage bag once a month or sooner if it starts to smell bad or look dirty. Below are steps to follow when changing the drainage bag. °1. Wash your hands with soap and water. °2. Pinch off the rubber catheter so that urine does not spill out. °3. Disconnect the catheter tube from the drainage tube at the connection valve. Do not let the tubes touch any surface. °4. Clean the end of the catheter tube with an alcohol wipe. Use a different alcohol wipe to clean the   end of the drainage tube. °5. Connect the catheter tube to the drainage tube of the clean drainage bag. °6. Attach the new bag to the leg with adhesive tape or a leg strap. Avoid attaching the new bag too tightly. °7. Wash your hands well. °Cleaning the Drainage Bag °1. Wash your hands with soap and water. °2. Wash the bag in warm, soapy water. °3. Rinse the bag thoroughly with warm water. °4. Fill the bag with a solution of white vinegar and water (1 cup vinegar to 1 qt warm water [.2 L vinegar to 1 L warm water]). Close the bag and soak it for 30 minutes in the solution. °5. Rinse the bag with warm water. °6. Hang the bag to dry with the pour spout open  and hanging downward. °7. Store the clean bag (once it is dry) in a clean plastic bag. °8. Wash your hands well. °PREVENTING INFECTION °· Wash your hands before and after handling your catheter. °· Take showers daily and wash the area where the catheter enters your body. Do not take baths. Replace wet leg straps with dry ones, if this applies. °· Do not use powders, sprays, or lotions on the genital area. Only use creams, lotions, or ointments as directed by your caregiver. °· For females, wipe from front to back after each bowel movement. °· Drink enough fluids to keep your urine clear or pale yellow unless you have a fluid restriction. °· Do not let the drainage bag or tubing touch or lie on the floor. °· Wear cotton underwear to absorb moisture and to keep your skin drier. °SEEK MEDICAL CARE IF:  °· Your urine is cloudy or smells unusually bad. °· Your catheter becomes clogged. °· You are not draining urine into the bag or your bladder feels full. °· Your catheter starts to leak. °SEEK IMMEDIATE MEDICAL CARE IF:  °· You have pain, swelling, redness, or pus where the catheter enters the body. °· You have pain in the abdomen, legs, lower back, or bladder. °· You have a fever. °· You see blood fill the catheter, or your urine is pink or red. °· You have nausea, vomiting, or chills. °· Your catheter gets pulled out. °MAKE SURE YOU:  °· Understand these instructions. °· Will watch your condition. °· Will get help right away if you are not doing well or get worse. °Document Released: 04/28/2005 Document Revised: 09/12/2013 Document Reviewed: 04/19/2012 °[employer]® Patient Information ©2015 [employer]. This information is not intended to replace advice given to you by your health care provider. Make sure you discuss any questions you have with your health care provider. ° °

## 2014-01-25 NOTE — ED Notes (Signed)
Dr. Goldston at the bedside. 

## 2014-01-25 NOTE — ED Notes (Signed)
Dr. Regenia Skeeter back in with pt.

## 2014-01-25 NOTE — ED Provider Notes (Signed)
CSN: [id number]     Arrival date & time 01/25/14  4782 History   First MD Initiated Contact with Patient 01/25/14 [phone]     Chief Complaint  Patient presents with  . Penis Pain     (Consider location/radiation/quality/duration/timing/severity/associated sxs/prior Treatment) HPI 63 year old male presents with pain at the tip of his penis since yesterday. He has had a Foley catheter since having colon surgery for a ruptured colon last month. Patient states that he's not had any fevers, vomiting, or abdominal pain. The pain seems to be at the tip of the penis where the Foley catheter is entering. He is taken 25 mg oxycodone was at his nursing facility without any relief. The urine has been darker over the last couple days. He has not noticed any blood. Foley does seem to be working though and not obstructed.  Past Medical History  Diagnosis Date  . Coronary artery disease   . Depression   . Peripheral vascular disease   . Seizures   . Deep vein thrombophlebitis of left leg   . Deep vein thrombosis of right lower extremity   . Psoriasis   . Arthritis   . DVT (deep venous thrombosis)     multiple times  . Weakness of both legs   . Peripheral neuropathy   . Venous stasis   . Hyperplasia of prostate   . BPH (benign prostatic hyperplasia)   . Hyperlipidemia    Past Surgical History  Procedure Laterality Date  . Colonoscopy  03/26/2012    Procedure: COLONOSCOPY;  Surgeon: Beryle Beams, MD;  Location: [facility];  Service: Endoscopy;  Laterality: N/A;  . Tonsillectomy      as child  . Flexible sigmoidoscopy N/A 12/09/2013    Procedure: FLEXIBLE SIGMOIDOSCOPY;  Surgeon: Beryle Beams, MD;  Location: [facility];  Service: Endoscopy;  Laterality: N/A;  . Laparotomy N/A 12/25/2013    Procedure: EXPLORATORY LAPAROTOMY/Transverse and right coloectomy/ end  ileostomy and mucus  fistula;  Surgeon: Ralene Ok, MD;  Location: [facility] OR;  Service: General;  Laterality: N/A;   Family  History  Problem Relation Age of Onset  . Vision loss Father   . Hypertension Father   . Diabetes Father   . Heart disease Father   . Cancer Father     colon  . Diabetes Mother   . Hypertension Mother   . Heart disease Mother   . Dementia Mother   . Cancer Mother     breast ca  . Diabetes Sister   . Hyperlipidemia Sister   . Hypertension Sister   . Diabetes Brother   . Kidney disease Brother   . Heart disease Brother    History  Substance Use Topics  . Smoking status: Former Smoker    Types: Pipe    Quit date: 03/26/1982  . Smokeless tobacco: Not on file  . Alcohol Use: No    Review of Systems  Constitutional: Negative for fever.  Gastrointestinal: Negative for nausea, vomiting and abdominal pain.  Genitourinary: Positive for penile pain.  Musculoskeletal: Negative for back pain.  All other systems reviewed and are negative.     Allergies  Penicillins  Home Medications   Prior to Admission medications   Medication Sig Start Date End Date Taking? Authorizing Provider  acetaminophen (TYLENOL) 500 MG tablet Take 500 mg by mouth 2 (two) times daily as needed for moderate pain.    Yes Historical Provider, MD  alum & mag hydroxide-simeth (MAALOX/MYLANTA) 200-200-20 MG/5ML suspension Take 30  mLs by mouth every 2 (two) hours as needed for indigestion or heartburn.    Yes Historical Provider, MD  Calcium Carb-Cholecalciferol (CALCIUM-VITAMIN D) 600-400 MG-UNIT TABS Take 1 tablet by mouth daily. For calcium supplement   Yes Historical Provider, MD  docusate sodium (COLACE) 100 MG capsule Take 1 capsule (100 mg total) by mouth 2 (two) times daily. 10/19/13  Yes Carmin Muskrat, MD  FLUoxetine (PROZAC) 20 MG capsule Take 20 mg by mouth every morning. For depression   Yes Historical Provider, MD  gabapentin (NEURONTIN) 600 MG tablet Take 600 mg by mouth 3 (three) times daily.   Yes Historical Provider, MD  HYDROcodone-acetaminophen (NORCO/VICODIN) 5-325 MG per tablet Take 1-2  tablets by mouth every 6 (six) hours as needed for moderate pain. 01/03/14  Yes Emina Riebock, NP  levETIRAcetam (KEPPRA) 750 MG tablet Take 1,500 mg by mouth 3 (three) times daily. For seizures   Yes Historical Provider, MD  Multiple Vitamin (MULTIVITAMIN WITH MINERALS) TABS Take 1 tablet by mouth daily.   Yes Historical Provider, MD  Nutritional Supplements (ENSURE CLEAR) LIQD Take 1 Can by mouth 3 (three) times daily.   Yes Historical Provider, MD  phenytoin (DILANTIN) 100 MG ER capsule Take 100 mg by mouth See admin instructions. Give 1 tablet by mouth four times daily on Tuesday, Thursday, Saturday, and Sunday. Give 1 tablet by mouth three times daily on Monday, Wednesday, and Friday.   Yes Historical Provider, MD  polyethylene glycol (MIRALAX / GLYCOLAX) packet Take 17 g by mouth daily as needed. For constipation 01/03/14  Yes Emina Riebock, NP  pravastatin (PRAVACHOL) 20 MG tablet Take 20 mg by mouth daily. For HLD   Yes Historical Provider, MD  rivaroxaban (XARELTO) 20 MG TABS tablet Take 20 mg by mouth daily with supper.   Yes Historical Provider, MD  tamsulosin (FLOMAX) 0.4 MG CAPS capsule Take 0.4 mg by mouth daily. For BPH 03/21/13  Yes Historical Provider, MD  traMADol (ULTRAM) 50 MG tablet Take 50 mg by mouth every 6 (six) hours as needed for moderate pain.   Yes Historical Provider, MD  vitamin B-12 (CYANOCOBALAMIN) 1000 MCG tablet Take 1,000 mcg by mouth daily. For supplement   Yes Historical Provider, MD  Zinc Oxide (SECURA EXTRA PROTECTIVE) 30.6 % CREA Apply 1 application topically 2 (two) times daily.   Yes Historical Provider, MD   BP 132/80  Pulse 83  Temp(Src) 97.5 F (36.4 C) (Oral)  Resp 20  SpO2 96% Physical Exam  Nursing note and vitals reviewed. Constitutional: He is oriented to person, place, and time. He appears well-developed and well-nourished.  HENT:  Head: Normocephalic and atraumatic.  Right Ear: External ear normal.  Left Ear: External ear normal.  Nose:  Nose normal.  Eyes: Right eye exhibits no discharge. Left eye exhibits no discharge.  Neck: Neck supple.  Cardiovascular: Normal rate.   Pulmonary/Chest: Effort normal.  Abdominal: Soft. He exhibits no distension. There is no tenderness.  Colostomy bags and wound VAC in place  Genitourinary: Testes normal. Right testis shows no swelling and no tenderness. Left testis shows no swelling and no tenderness. Circumcised. No penile tenderness. No discharge found.  Foley catheter in place without discharge or bleeding  Neurological: He is alert and oriented to person, place, and time.  Skin: Skin is warm and dry.    ED Course  Procedures (including critical care time) Labs Review Labs Reviewed  URINALYSIS, ROUTINE W REFLEX MICROSCOPIC - Abnormal; Notable for the following:    Color,  Urine BROWN (*)    APPearance CLOUDY (*)    Hgb urine dipstick LARGE (*)    Protein, ur 30 (*)    Leukocytes, UA TRACE (*)    All other components within normal limits  URINE MICROSCOPIC-ADD ON - Abnormal; Notable for the following:    Bacteria, UA FEW (*)    Crystals CA OXALATE CRYSTALS (*)    All other components within normal limits  URINE CULTURE    Imaging Review No results found.   EKG Interpretation None      MDM   Final diagnoses:  Obstruction of Foley catheter, initial encounter    Patient's pain significantly improved after Foley was removed and another was replaced. There were large clots per the nurse. His urine and started to clear. He has trace leukocytes but no obvious bacteria or nitrates to suggest a UTI as the cause. Given his pain improved with this and he is now urinating normally I feel that this is the cause of his penile pain. He is otherwise stable for discharge for followup with his general surgeon and a urologist.    Ephraim Hamburger, MD 01/25/14 [phone]

## 2014-01-27 LAB — CULTURE, BLOOD (ROUTINE X 2)
CULTURE: NO GROWTH
Culture: NO GROWTH

## 2014-01-27 LAB — URINE CULTURE: Colony Count: 10000

## 2014-01-28 ENCOUNTER — Telehealth (HOSPITAL_BASED_OUTPATIENT_CLINIC_OR_DEPARTMENT_OTHER): Payer: Self-pay | Admitting: Emergency Medicine

## 2014-01-28 NOTE — Telephone Encounter (Signed)
Post ED Visit - Positive Culture Follow-up  Culture report reviewed by antimicrobial stewardship pharmacist: []  Wes Galena, Pharm.D., BCPS []  Heide Guile, Pharm.D., BCPS []  Alycia Rossetti, Pharm.D., BCPS [x]  [place], [place].D., BCPS, AAHIVP []  Legrand Como, Pharm.D., BCPS, AAHIVP []  Carly Sabat, Pharm.D. []  Elenor Quinones, Pharm.D.  Positive urine culture 10,000 colonies /ml Citrobacter  Treated with none, no further patient follow-up is required at this time.  Hazle Nordmann 01/28/2014, 4:32 PM

## 2014-01-28 NOTE — Progress Notes (Signed)
ED Antimicrobial Stewardship Positive Culture Follow Up   [name] is an 63 y.o. male who presented to [facility] on 01/25/2014 with a chief complaint of  Chief Complaint  Patient presents with  . Penis Pain    Recent Results (from the past 720 hour(s))  CULTURE, BLOOD (ROUTINE X 2)     Status: None   Collection Time    01/21/14  7:30 AM      Result Value Ref Range Status   Specimen Description BLOOD RIGHT WRIST   Final   Special Requests BOTTLES DRAWN AEROBIC AND ANAEROBIC 5CC   Final   Culture  Setup Time     Final   Value: 01/21/2014 15:15     Performed at [facility]   Culture     Final   Value: NO GROWTH 5 DAYS     Performed at [facility]   Report Status 01/27/2014 FINAL   Final  CULTURE, BLOOD (ROUTINE X 2)     Status: None   Collection Time    01/21/14  7:35 AM      Result Value Ref Range Status   Specimen Description BLOOD LEFT ARM   Final   Special Requests BOTTLES DRAWN AEROBIC AND ANAEROBIC 10CC   Final   Culture  Setup Time     Final   Value: 01/21/2014 15:20     Performed at [facility]   Culture     Final   Value: NO GROWTH 5 DAYS     Note: Culture results may be compromised due to an excessive volume of blood received in culture bottles.     Performed at [facility]   Report Status 01/27/2014 FINAL   Final  URINE CULTURE     Status: None   Collection Time    01/25/14  8:26 AM      Result Value Ref Range Status   Specimen Description URINE, CATHETERIZED   Final   Special Requests NONE   Final   Culture  Setup Time     Final   Value: 01/25/2014 11:56     Performed at [place]     Final   Value: 10,000 COLONIES/ML     Performed at [facility]   Culture     Final   Value: CITROBACTER KOSERI     Performed at [facility]   Report Status 01/27/2014 FINAL   Final   Organism ID, Bacteria CITROBACTER KOSERI   Final    Came in with penis pain. Pt has a foley so pain was  attributed to it. Ucx only came back with 10k citrobacter. Will not treat.   ED Provider: Jamse Mead, PA  Onnie Boer, PharmD Pager: [phone] Infectious Diseases Pharmacist Phone# [phone]

## 2014-01-29 ENCOUNTER — Encounter (HOSPITAL_COMMUNITY): Payer: Self-pay | Admitting: Emergency Medicine

## 2014-01-29 ENCOUNTER — Inpatient Hospital Stay (HOSPITAL_COMMUNITY)
Admission: EM | Admit: 2014-01-29 | Discharge: 2014-02-02 | DRG: 698 | Disposition: A | Discharge status: Skilled Nursing Facility | Payer: Medicare Other | Attending: Internal Medicine | Admitting: Internal Medicine

## 2014-01-29 ENCOUNTER — Emergency Department (HOSPITAL_COMMUNITY): Payer: Medicare Other

## 2014-01-29 DIAGNOSIS — A419 Sepsis, unspecified organism: Secondary | ICD-10-CM

## 2014-01-29 DIAGNOSIS — Z432 Encounter for attention to ileostomy: Secondary | ICD-10-CM

## 2014-01-29 DIAGNOSIS — Z86718 Personal history of other venous thrombosis and embolism: Secondary | ICD-10-CM

## 2014-01-29 DIAGNOSIS — Z23 Encounter for immunization: Secondary | ICD-10-CM

## 2014-01-29 DIAGNOSIS — N400 Enlarged prostate without lower urinary tract symptoms: Secondary | ICD-10-CM | POA: Diagnosis present

## 2014-01-29 DIAGNOSIS — N390 Urinary tract infection, site not specified: Secondary | ICD-10-CM

## 2014-01-29 DIAGNOSIS — F3289 Other specified depressive episodes: Secondary | ICD-10-CM | POA: Diagnosis present

## 2014-01-29 DIAGNOSIS — G609 Hereditary and idiopathic neuropathy, unspecified: Secondary | ICD-10-CM | POA: Diagnosis present

## 2014-01-29 DIAGNOSIS — R509 Fever, unspecified: Secondary | ICD-10-CM

## 2014-01-29 DIAGNOSIS — T83511A Infection and inflammatory reaction due to indwelling urethral catheter, initial encounter: Principal | ICD-10-CM | POA: Diagnosis present

## 2014-01-29 DIAGNOSIS — K644 Residual hemorrhoidal skin tags: Secondary | ICD-10-CM | POA: Diagnosis present

## 2014-01-29 DIAGNOSIS — B964 Proteus (mirabilis) (morganii) as the cause of diseases classified elsewhere: Secondary | ICD-10-CM | POA: Diagnosis present

## 2014-01-29 DIAGNOSIS — Y846 Urinary catheterization as the cause of abnormal reaction of the patient, or of later complication, without mention of misadventure at the time of the procedure: Secondary | ICD-10-CM | POA: Diagnosis present

## 2014-01-29 DIAGNOSIS — J189 Pneumonia, unspecified organism: Secondary | ICD-10-CM | POA: Diagnosis present

## 2014-01-29 DIAGNOSIS — I739 Peripheral vascular disease, unspecified: Secondary | ICD-10-CM | POA: Diagnosis present

## 2014-01-29 DIAGNOSIS — R569 Unspecified convulsions: Secondary | ICD-10-CM

## 2014-01-29 DIAGNOSIS — G40909 Epilepsy, unspecified, not intractable, without status epilepticus: Secondary | ICD-10-CM | POA: Diagnosis present

## 2014-01-29 DIAGNOSIS — Z87891 Personal history of nicotine dependence: Secondary | ICD-10-CM

## 2014-01-29 DIAGNOSIS — I2510 Atherosclerotic heart disease of native coronary artery without angina pectoris: Secondary | ICD-10-CM | POA: Diagnosis present

## 2014-01-29 DIAGNOSIS — Z79899 Other long term (current) drug therapy: Secondary | ICD-10-CM

## 2014-01-29 DIAGNOSIS — R7309 Other abnormal glucose: Secondary | ICD-10-CM | POA: Diagnosis present

## 2014-01-29 DIAGNOSIS — Z7901 Long term (current) use of anticoagulants: Secondary | ICD-10-CM

## 2014-01-29 DIAGNOSIS — R739 Hyperglycemia, unspecified: Secondary | ICD-10-CM

## 2014-01-29 DIAGNOSIS — F329 Major depressive disorder, single episode, unspecified: Secondary | ICD-10-CM | POA: Diagnosis present

## 2014-01-29 DIAGNOSIS — E785 Hyperlipidemia, unspecified: Secondary | ICD-10-CM

## 2014-01-29 LAB — CBC WITH DIFFERENTIAL/PLATELET
BASOS ABS: 0 10*3/uL (ref 0.0–0.1)
Basophils Relative: 0 % (ref 0–1)
EOS ABS: 0 10*3/uL (ref 0.0–0.7)
Eosinophils Relative: 0 % (ref 0–5)
HCT: 44.4 % (ref 39.0–52.0)
Hemoglobin: 14.9 g/dL (ref 13.0–17.0)
LYMPHS ABS: 0.7 10*3/uL (ref 0.7–4.0)
Lymphocytes Relative: 5 % — ABNORMAL LOW (ref 12–46)
MCH: 30.8 pg (ref 26.0–34.0)
MCHC: 33.6 g/dL (ref 30.0–36.0)
MCV: 91.7 fL (ref 78.0–100.0)
Monocytes Absolute: 0.7 10*3/uL (ref 0.1–1.0)
Monocytes Relative: 5 % (ref 3–12)
Neutro Abs: 13.4 10*3/uL — ABNORMAL HIGH (ref 1.7–7.7)
Neutrophils Relative %: 90 % — ABNORMAL HIGH (ref 43–77)
PLATELETS: 199 10*3/uL (ref 150–400)
RBC: 4.84 MIL/uL (ref 4.22–5.81)
RDW: 13.2 % (ref 11.5–15.5)
WBC: 14.8 10*3/uL — ABNORMAL HIGH (ref 4.0–10.5)

## 2014-01-29 LAB — LACTIC ACID, PLASMA: LACTIC ACID, VENOUS: 2 mmol/L (ref 0.5–2.2)

## 2014-01-29 MED ORDER — HYDROCODONE-ACETAMINOPHEN 5-325 MG PO TABS
1.0000 | ORAL_TABLET | Freq: Once | ORAL | Status: AC
  Administered 2014-01-30: 1 via ORAL
  Filled 2014-01-29: qty 1

## 2014-01-29 MED ORDER — SODIUM CHLORIDE 0.9 % IV BOLUS (SEPSIS)
30.0000 mL/kg | Freq: Once | INTRAVENOUS | Status: AC
Start: 2014-01-29 — End: 2014-01-30
  Administered 2014-01-30: 3741 mL via INTRAVENOUS

## 2014-01-29 MED ORDER — ACETAMINOPHEN 325 MG PO TABS
650.0000 mg | ORAL_TABLET | Freq: Once | ORAL | Status: AC
  Administered 2014-01-29: 650 mg via ORAL
  Filled 2014-01-29: qty 2

## 2014-01-29 MED ORDER — SODIUM CHLORIDE 0.9 % IV SOLN
1000.0000 mL | INTRAVENOUS | Status: DC
  Administered 2014-01-30: 1000 mL via INTRAVENOUS

## 2014-01-29 MED ORDER — SODIUM CHLORIDE 0.9 % IV BOLUS (SEPSIS)
1000.0000 mL | Freq: Once | INTRAVENOUS | Status: AC
  Administered 2014-01-29: 1000 mL via INTRAVENOUS

## 2014-01-29 MED ORDER — DEXTROSE 5 % IV SOLN
2.0000 g | Freq: Once | INTRAVENOUS | Status: AC
  Administered 2014-01-30: 2 g via INTRAVENOUS
  Filled 2014-01-29: qty 2

## 2014-01-29 MED ORDER — LEVOFLOXACIN IN D5W 750 MG/150ML IV SOLN
750.0000 mg | Freq: Once | INTRAVENOUS | Status: AC
  Administered 2014-01-30: 750 mg via INTRAVENOUS
  Filled 2014-01-29: qty 150

## 2014-01-29 NOTE — ED Notes (Signed)
EMS called to [facility].  Patient is alert and oriented x3.   Patient is complaining of penis pain that has been on going for 2 weeks. He has been seen here for the same issue but today the pain has gotten Worse.  Currently he rates his pain 10 of 10.

## 2014-01-30 ENCOUNTER — Inpatient Hospital Stay (HOSPITAL_COMMUNITY): Payer: Medicare Other

## 2014-01-30 ENCOUNTER — Encounter (HOSPITAL_COMMUNITY): Payer: Self-pay | Admitting: Internal Medicine

## 2014-01-30 DIAGNOSIS — G609 Hereditary and idiopathic neuropathy, unspecified: Secondary | ICD-10-CM | POA: Diagnosis not present

## 2014-01-30 DIAGNOSIS — Y846 Urinary catheterization as the cause of abnormal reaction of the patient, or of later complication, without mention of misadventure at the time of the procedure: Secondary | ICD-10-CM | POA: Diagnosis not present

## 2014-01-30 DIAGNOSIS — I739 Peripheral vascular disease, unspecified: Secondary | ICD-10-CM | POA: Diagnosis not present

## 2014-01-30 DIAGNOSIS — F3289 Other specified depressive episodes: Secondary | ICD-10-CM | POA: Diagnosis present

## 2014-01-30 DIAGNOSIS — F329 Major depressive disorder, single episode, unspecified: Secondary | ICD-10-CM | POA: Diagnosis not present

## 2014-01-30 DIAGNOSIS — E785 Hyperlipidemia, unspecified: Secondary | ICD-10-CM

## 2014-01-30 DIAGNOSIS — A419 Sepsis, unspecified organism: Secondary | ICD-10-CM | POA: Diagnosis not present

## 2014-01-30 DIAGNOSIS — Z432 Encounter for attention to ileostomy: Secondary | ICD-10-CM | POA: Diagnosis not present

## 2014-01-30 DIAGNOSIS — K644 Residual hemorrhoidal skin tags: Secondary | ICD-10-CM | POA: Diagnosis not present

## 2014-01-30 DIAGNOSIS — R509 Fever, unspecified: Secondary | ICD-10-CM | POA: Diagnosis present

## 2014-01-30 DIAGNOSIS — G40909 Epilepsy, unspecified, not intractable, without status epilepticus: Secondary | ICD-10-CM | POA: Diagnosis not present

## 2014-01-30 DIAGNOSIS — Z7901 Long term (current) use of anticoagulants: Secondary | ICD-10-CM | POA: Diagnosis not present

## 2014-01-30 DIAGNOSIS — N390 Urinary tract infection, site not specified: Secondary | ICD-10-CM | POA: Diagnosis present

## 2014-01-30 DIAGNOSIS — I2510 Atherosclerotic heart disease of native coronary artery without angina pectoris: Secondary | ICD-10-CM | POA: Diagnosis not present

## 2014-01-30 DIAGNOSIS — J189 Pneumonia, unspecified organism: Secondary | ICD-10-CM | POA: Diagnosis not present

## 2014-01-30 DIAGNOSIS — R739 Hyperglycemia, unspecified: Secondary | ICD-10-CM | POA: Diagnosis present

## 2014-01-30 DIAGNOSIS — Z23 Encounter for immunization: Secondary | ICD-10-CM | POA: Diagnosis not present

## 2014-01-30 DIAGNOSIS — N400 Enlarged prostate without lower urinary tract symptoms: Secondary | ICD-10-CM | POA: Diagnosis not present

## 2014-01-30 DIAGNOSIS — Z86718 Personal history of other venous thrombosis and embolism: Secondary | ICD-10-CM

## 2014-01-30 DIAGNOSIS — B964 Proteus (mirabilis) (morganii) as the cause of diseases classified elsewhere: Secondary | ICD-10-CM | POA: Diagnosis not present

## 2014-01-30 DIAGNOSIS — Z79899 Other long term (current) drug therapy: Secondary | ICD-10-CM | POA: Diagnosis not present

## 2014-01-30 DIAGNOSIS — R7309 Other abnormal glucose: Secondary | ICD-10-CM | POA: Diagnosis not present

## 2014-01-30 DIAGNOSIS — T83511A Infection and inflammatory reaction due to indwelling urethral catheter, initial encounter: Secondary | ICD-10-CM | POA: Diagnosis present

## 2014-01-30 DIAGNOSIS — Z87891 Personal history of nicotine dependence: Secondary | ICD-10-CM | POA: Diagnosis not present

## 2014-01-30 LAB — CBC WITH DIFFERENTIAL/PLATELET
Basophils Absolute: 0 10*3/uL (ref 0.0–0.1)
Basophils Relative: 0 % (ref 0–1)
EOS ABS: 0 10*3/uL (ref 0.0–0.7)
Eosinophils Relative: 0 % (ref 0–5)
HCT: 36.5 % — ABNORMAL LOW (ref 39.0–52.0)
Hemoglobin: 12 g/dL — ABNORMAL LOW (ref 13.0–17.0)
LYMPHS ABS: 0.8 10*3/uL (ref 0.7–4.0)
LYMPHS PCT: 5 % — AB (ref 12–46)
MCH: 30.6 pg (ref 26.0–34.0)
MCHC: 32.9 g/dL (ref 30.0–36.0)
MCV: 93.1 fL (ref 78.0–100.0)
Monocytes Absolute: 1.6 10*3/uL — ABNORMAL HIGH (ref 0.1–1.0)
Monocytes Relative: 10 % (ref 3–12)
NEUTROS ABS: 13.6 10*3/uL — AB (ref 1.7–7.7)
NEUTROS PCT: 85 % — AB (ref 43–77)
PLATELETS: 146 10*3/uL — AB (ref 150–400)
RBC: 3.92 MIL/uL — ABNORMAL LOW (ref 4.22–5.81)
RDW: 13.3 % (ref 11.5–15.5)
WBC: 16 10*3/uL — AB (ref 4.0–10.5)

## 2014-01-30 LAB — COMPREHENSIVE METABOLIC PANEL
ALT: 45 U/L (ref 0–53)
ALT: 44 U/L (ref 0–53)
ANION GAP: 10 (ref 5–15)
AST: 44 U/L — ABNORMAL HIGH (ref 0–37)
AST: 33 U/L (ref 0–37)
Albumin: 2.7 g/dL — ABNORMAL LOW (ref 3.5–5.2)
Albumin: 2 g/dL — ABNORMAL LOW (ref 3.5–5.2)
Alkaline Phosphatase: 231 U/L — ABNORMAL HIGH (ref 39–117)
Alkaline Phosphatase: 183 U/L — ABNORMAL HIGH (ref 39–117)
Anion gap: 14 (ref 5–15)
BUN: 11 mg/dL (ref 6–23)
BUN: 10 mg/dL (ref 6–23)
CALCIUM: 8.9 mg/dL (ref 8.4–10.5)
CO2: 23 mEq/L (ref 19–32)
CO2: 20 meq/L (ref 19–32)
CREATININE: 0.61 mg/dL (ref 0.50–1.35)
Calcium: 7.8 mg/dL — ABNORMAL LOW (ref 8.4–10.5)
Chloride: 96 mEq/L (ref 96–112)
Chloride: 103 mEq/L (ref 96–112)
Creatinine, Ser: 0.69 mg/dL (ref 0.50–1.35)
GLUCOSE: 150 mg/dL — AB (ref 70–99)
GLUCOSE: 104 mg/dL — AB (ref 70–99)
Potassium: 4.7 mEq/L (ref 3.7–5.3)
Potassium: 4 mEq/L (ref 3.7–5.3)
SODIUM: 133 meq/L — AB (ref 137–147)
Sodium: 133 mEq/L — ABNORMAL LOW (ref 137–147)
TOTAL PROTEIN: 7.5 g/dL (ref 6.0–8.3)
TOTAL PROTEIN: 5.6 g/dL — AB (ref 6.0–8.3)
Total Bilirubin: 0.3 mg/dL (ref 0.3–1.2)
Total Bilirubin: 0.5 mg/dL (ref 0.3–1.2)

## 2014-01-30 LAB — URINALYSIS, ROUTINE W REFLEX MICROSCOPIC
Bilirubin Urine: NEGATIVE
GLUCOSE, UA: NEGATIVE mg/dL
Ketones, ur: NEGATIVE mg/dL
Nitrite: NEGATIVE
PH: 8.5 — AB (ref 5.0–8.0)
Protein, ur: 100 mg/dL — AB
SPECIFIC GRAVITY, URINE: 1.022 (ref 1.005–1.030)
Urobilinogen, UA: 0.2 mg/dL (ref 0.0–1.0)

## 2014-01-30 LAB — HEMOGLOBIN A1C
HEMOGLOBIN A1C: 5.1 % (ref <5.7)
MEAN PLASMA GLUCOSE: 100 mg/dL (ref <117)

## 2014-01-30 LAB — URINE MICROSCOPIC-ADD ON

## 2014-01-30 LAB — MRSA PCR SCREENING: MRSA BY PCR: NEGATIVE

## 2014-01-30 LAB — CLOSTRIDIUM DIFFICILE BY PCR: Toxigenic C. Difficile by PCR: NEGATIVE

## 2014-01-30 LAB — PHENYTOIN LEVEL, TOTAL: Phenytoin Lvl: 4.4 ug/mL — ABNORMAL LOW (ref 10.0–20.0)

## 2014-01-30 LAB — OCCULT BLOOD, POC DEVICE: FECAL OCCULT BLD: NEGATIVE

## 2014-01-30 LAB — LACTIC ACID, PLASMA: LACTIC ACID, VENOUS: 1.4 mmol/L (ref 0.5–2.2)

## 2014-01-30 MED ORDER — HYDROMORPHONE HCL 1 MG/ML IJ SOLN
0.5000 mg | Freq: Once | INTRAMUSCULAR | Status: AC
  Administered 2014-01-30: 0.5 mg via INTRAVENOUS
  Filled 2014-01-30: qty 1

## 2014-01-30 MED ORDER — TAMSULOSIN HCL 0.4 MG PO CAPS
0.4000 mg | ORAL_CAPSULE | Freq: Every day | ORAL | Status: DC
  Administered 2014-01-30 – 2014-02-02 (×4): 0.4 mg via ORAL
  Filled 2014-01-30 (×4): qty 1

## 2014-01-30 MED ORDER — DOCUSATE SODIUM 100 MG PO CAPS
100.0000 mg | ORAL_CAPSULE | Freq: Two times a day (BID) | ORAL | Status: DC
  Administered 2014-01-30: 100 mg via ORAL
  Filled 2014-01-30 (×2): qty 1

## 2014-01-30 MED ORDER — MORPHINE SULFATE 2 MG/ML IJ SOLN
2.0000 mg | INTRAMUSCULAR | Status: DC | PRN
  Administered 2014-01-30 – 2014-02-02 (×11): 2 mg via INTRAVENOUS
  Filled 2014-01-30 (×11): qty 1

## 2014-01-30 MED ORDER — SODIUM CHLORIDE 0.9 % IV SOLN
1250.0000 mg | Freq: Two times a day (BID) | INTRAVENOUS | Status: DC
  Administered 2014-01-30 – 2014-01-31 (×2): 1250 mg via INTRAVENOUS
  Filled 2014-01-30 (×3): qty 1250

## 2014-01-30 MED ORDER — IOHEXOL 300 MG/ML  SOLN
100.0000 mL | Freq: Once | INTRAMUSCULAR | Status: AC | PRN
  Administered 2014-01-30: 100 mL via INTRAVENOUS

## 2014-01-30 MED ORDER — SODIUM CHLORIDE 0.9 % IV SOLN
INTRAVENOUS | Status: DC
  Administered 2014-01-30: 06:00:00 via INTRAVENOUS

## 2014-01-30 MED ORDER — SIMVASTATIN 10 MG PO TABS
10.0000 mg | ORAL_TABLET | Freq: Every day | ORAL | Status: DC
  Administered 2014-01-30 – 2014-02-02 (×4): 10 mg via ORAL
  Filled 2014-01-30 (×4): qty 1

## 2014-01-30 MED ORDER — VITAMIN B-12 1000 MCG PO TABS
1000.0000 ug | ORAL_TABLET | Freq: Every day | ORAL | Status: DC
Start: 2014-01-30 — End: 2014-02-02
  Administered 2014-01-30 – 2014-02-02 (×4): 1000 ug via ORAL
  Filled 2014-01-30 (×4): qty 1

## 2014-01-30 MED ORDER — HYDROCODONE-ACETAMINOPHEN 5-325 MG PO TABS
1.0000 | ORAL_TABLET | Freq: Four times a day (QID) | ORAL | Status: DC | PRN
  Administered 2014-01-30 – 2014-02-02 (×7): 2 via ORAL
  Filled 2014-01-30 (×7): qty 2

## 2014-01-30 MED ORDER — GABAPENTIN 300 MG PO CAPS
600.0000 mg | ORAL_CAPSULE | Freq: Three times a day (TID) | ORAL | Status: DC
  Administered 2014-01-30 – 2014-02-02 (×11): 600 mg via ORAL
  Filled 2014-01-30 (×12): qty 2

## 2014-01-30 MED ORDER — ALUM & MAG HYDROXIDE-SIMETH 200-200-20 MG/5ML PO SUSP: 30.0000 mL | ORAL | Status: DC | PRN

## 2014-01-30 MED ORDER — LEVOFLOXACIN IN D5W 750 MG/150ML IV SOLN
750.0000 mg | INTRAVENOUS | Status: DC
Start: 2014-01-30 — End: 2014-01-30

## 2014-01-30 MED ORDER — DEXTROSE 5 % IV SOLN
2.0000 g | Freq: Three times a day (TID) | INTRAVENOUS | Status: DC
  Filled 2014-01-30: qty 2

## 2014-01-30 MED ORDER — TRAMADOL HCL 50 MG PO TABS: 50.0000 mg | ORAL_TABLET | Freq: Four times a day (QID) | ORAL | Status: DC | PRN

## 2014-01-30 MED ORDER — ONDANSETRON HCL 4 MG/2ML IJ SOLN: 4.0000 mg | Freq: Four times a day (QID) | INTRAMUSCULAR | Status: DC | PRN

## 2014-01-30 MED ORDER — PHENYTOIN SODIUM EXTENDED 100 MG PO CAPS
100.0000 mg | ORAL_CAPSULE | ORAL | Status: DC
  Administered 2014-01-31 – 2014-02-02 (×7): 100 mg via ORAL
  Filled 2014-01-30 (×8): qty 1

## 2014-01-30 MED ORDER — INFLUENZA VAC SPLIT QUAD 0.5 ML IM SUSY
0.5000 mL | PREFILLED_SYRINGE | INTRAMUSCULAR | Status: AC
  Administered 2014-01-31: 0.5 mL via INTRAMUSCULAR
  Filled 2014-01-30 (×2): qty 0.5

## 2014-01-30 MED ORDER — PHENYTOIN SODIUM EXTENDED 100 MG PO CAPS
100.0000 mg | ORAL_CAPSULE | Freq: Four times a day (QID) | ORAL | Status: DC
  Administered 2014-01-30 (×2): 100 mg via ORAL
  Filled 2014-01-30 (×5): qty 1

## 2014-01-30 MED ORDER — ACETAMINOPHEN 325 MG PO TABS: 650.0000 mg | ORAL_TABLET | Freq: Four times a day (QID) | ORAL | Status: DC | PRN

## 2014-01-30 MED ORDER — PIPERACILLIN-TAZOBACTAM 3.375 G IVPB
3.3750 g | Freq: Three times a day (TID) | INTRAVENOUS | Status: DC
  Administered 2014-01-30 – 2014-02-01 (×6): 3.375 g via INTRAVENOUS
  Filled 2014-01-30 (×7): qty 50

## 2014-01-30 MED ORDER — LEVETIRACETAM 500 MG PO TABS
1500.0000 mg | ORAL_TABLET | Freq: Three times a day (TID) | ORAL | Status: DC
  Administered 2014-01-30 – 2014-02-02 (×11): 1500 mg via ORAL
  Filled 2014-01-30 (×12): qty 3

## 2014-01-30 MED ORDER — ACETAMINOPHEN 650 MG RE SUPP: 650.0000 mg | Freq: Four times a day (QID) | RECTAL | Status: DC | PRN

## 2014-01-30 MED ORDER — POLYETHYLENE GLYCOL 3350 17 G PO PACK
17.0000 g | PACK | Freq: Every day | ORAL | Status: DC | PRN
  Filled 2014-01-30: qty 1

## 2014-01-30 MED ORDER — ENOXAPARIN SODIUM 120 MG/0.8ML ~~LOC~~ SOLN
120.0000 mg | Freq: Two times a day (BID) | SUBCUTANEOUS | Status: DC
  Administered 2014-01-30 – 2014-02-02 (×6): 120 mg via SUBCUTANEOUS
  Filled 2014-01-30 (×7): qty 0.8

## 2014-01-30 MED ORDER — SODIUM CHLORIDE 0.9 % IV SOLN
INTRAVENOUS | Status: AC
  Administered 2014-01-30 – 2014-01-31 (×3): via INTRAVENOUS

## 2014-01-30 MED ORDER — IOHEXOL 300 MG/ML  SOLN
25.0000 mL | Freq: Once | INTRAMUSCULAR | Status: AC | PRN
  Administered 2014-01-30: 25 mL via ORAL

## 2014-01-30 MED ORDER — VANCOMYCIN HCL 10 G IV SOLR
1500.0000 mg | Freq: Once | INTRAVENOUS | Status: AC
  Administered 2014-01-30: 1500 mg via INTRAVENOUS
  Filled 2014-01-30: qty 1500

## 2014-01-30 MED ORDER — ENOXAPARIN SODIUM 100 MG/ML ~~LOC~~ SOLN
100.0000 mg | Freq: Once | SUBCUTANEOUS | Status: AC
  Administered 2014-01-30: 100 mg via SUBCUTANEOUS
  Filled 2014-01-30: qty 1

## 2014-01-30 MED ORDER — RIVAROXABAN 20 MG PO TABS
20.0000 mg | ORAL_TABLET | Freq: Every day | ORAL | Status: DC
  Filled 2014-01-30: qty 1

## 2014-01-30 MED ORDER — FLUOXETINE HCL 20 MG PO CAPS
20.0000 mg | ORAL_CAPSULE | Freq: Every morning | ORAL | Status: DC
  Administered 2014-01-30 – 2014-02-02 (×4): 20 mg via ORAL
  Filled 2014-01-30 (×4): qty 1

## 2014-01-30 MED ORDER — SODIUM CHLORIDE 0.9 % IV SOLN: INTRAVENOUS | Status: DC

## 2014-01-30 MED ORDER — ZINC OXIDE 40 % EX OINT
1.0000 "application " | TOPICAL_OINTMENT | Freq: Two times a day (BID) | CUTANEOUS | Status: DC
  Administered 2014-01-30 – 2014-02-02 (×7): 1 via TOPICAL
  Filled 2014-01-30: qty 56

## 2014-01-30 MED ORDER — PHENYTOIN SODIUM EXTENDED 100 MG PO CAPS
100.0000 mg | ORAL_CAPSULE | ORAL | Status: DC
  Administered 2014-01-30 – 2014-02-01 (×5): 100 mg via ORAL
  Filled 2014-01-30 (×5): qty 1

## 2014-01-30 MED ORDER — ONDANSETRON HCL 4 MG PO TABS: 4.0000 mg | ORAL_TABLET | Freq: Four times a day (QID) | ORAL | Status: DC | PRN

## 2014-01-30 NOTE — Progress Notes (Signed)
ANTIBIOTIC CONSULT NOTE - INITIAL  Pharmacy Consult for Aztreonam, levofloxacin Indication: rule out sepsis  Allergies  Allergen Reactions  . Penicillins     Mother and brother have had severe allergic reactions, no personal reaction    Patient Measurements: Height: 6\' 5"  (195.6 cm) Weight: 275 lb (124.739 kg) IBW/kg (Calculated) : 89.1 Adjusted Body Weight:   Vital Signs: Temp: 102.5 F (39.2 C) (09/21 0144) Temp src: Rectal (09/20 2309) BP: 110/55 mmHg (09/21 0308) Pulse Rate: 99 (09/21 0308) Intake/Output from previous day:   Intake/Output from this shift:    Labs:  Recent Labs  01/29/14 2327  WBC 14.8*  HGB 14.9  PLT 199  CREATININE 0.69   Estimated Creatinine Clearance: 141.7 ml/min (by C-G formula based on Cr of 0.69). No results found for this basename: VANCOTROUGH, VANCOPEAK, VANCORANDOM, [place], GENTPEAK, [place], [place], TOBRAPEAK, TOBRARND, AMIKACINPEAK, AMIKACINTROU, AMIKACIN,  in the last 72 hours   Microbiology: Recent Results (from the past 720 hour(s))  CULTURE, BLOOD (ROUTINE X 2)     Status: None   Collection Time    01/21/14  7:30 AM      Result Value Ref Range Status   Specimen Description BLOOD RIGHT WRIST   Final   Special Requests BOTTLES DRAWN AEROBIC AND ANAEROBIC 5CC   Final   Culture  Setup Time     Final   Value: 01/21/2014 15:15     Performed at [facility]   Culture     Final   Value: NO GROWTH 5 DAYS     Performed at [facility]   Report Status 01/27/2014 FINAL   Final  CULTURE, BLOOD (ROUTINE X 2)     Status: None   Collection Time    01/21/14  7:35 AM      Result Value Ref Range Status   Specimen Description BLOOD LEFT ARM   Final   Special Requests BOTTLES DRAWN AEROBIC AND ANAEROBIC 10CC   Final   Culture  Setup Time     Final   Value: 01/21/2014 15:20     Performed at [facility]   Culture     Final   Value: NO GROWTH 5 DAYS     Note: Culture results may be compromised due to  an excessive volume of blood received in culture bottles.     Performed at [facility]   Report Status 01/27/2014 FINAL   Final  URINE CULTURE     Status: None   Collection Time    01/25/14  8:26 AM      Result Value Ref Range Status   Specimen Description URINE, CATHETERIZED   Final   Special Requests NONE   Final   Culture  Setup Time     Final   Value: 01/25/2014 11:56     Performed at [facility]     Final   Value: 10,000 COLONIES/ML     Performed at [facility]   Culture     Final   Value: CITROBACTER KOSERI     Performed at [facility]   Report Status 01/27/2014 FINAL   Final   Organism ID, Bacteria CITROBACTER KOSERI   Final    Medical History: Past Medical History  Diagnosis Date  . Coronary artery disease   . Depression   . Peripheral vascular disease   . Seizures   . Deep vein thrombophlebitis of left leg   . Deep vein thrombosis of right  lower extremity   . Psoriasis   . Arthritis   . DVT (deep venous thrombosis)     multiple times  . Weakness of both legs   . Peripheral neuropathy   . Venous stasis   . Hyperplasia of prostate   . BPH (benign prostatic hyperplasia)   . Hyperlipidemia     Medications:  Anti-infectives   Start     Dose/Rate Route Frequency Ordered Stop   01/30/14 2200  levofloxacin (LEVAQUIN) IVPB 750 mg     750 mg 100 mL/hr over 90 Minutes Intravenous Every 24 hours 01/30/14 0444     01/30/14 1400  aztreonam (AZACTAM) 2 g in dextrose 5 % 50 mL IVPB     2 g 100 mL/hr over 30 Minutes Intravenous 3 times per day 01/30/14 0444     01/29/14 2345  levofloxacin (LEVAQUIN) IVPB 750 mg     750 mg 100 mL/hr over 90 Minutes Intravenous  Once 01/29/14 2337 01/30/14 0240   01/29/14 2345  aztreonam (AZACTAM) 2 g in dextrose 5 % 50 mL IVPB     2 g 100 mL/hr over 30 Minutes Intravenous  Once 01/29/14 2337 01/30/14 0353     Assessment: Patient with urosepsis.  First dose of antibiotics already  given.   Goal of Therapy:  Aztreonam, levofloxacin dosed based on patient weight and renal function   Plan:  Follow up culture results Levofloxacin 750mg  iv q24hr     Nani Skillern Crowford 01/30/2014,4:45 AM

## 2014-01-30 NOTE — Progress Notes (Signed)
ANTIBIOTIC CONSULT NOTE - INITIAL  Pharmacy Consult for vancomycin and Zosyn Indication: rule out sepsis  Allergies  Allergen Reactions  . Penicillins     Mother and brother have had severe allergic reactions, no personal reaction    Patient Measurements: Height: 6\' 5"  (195.6 cm) Weight: 264 lb 1.8 oz (119.8 kg) IBW/kg (Calculated) : 89.1 Adjusted Body Weight: 101kg  Vital Signs: Temp: 98.1 F (36.7 C) (09/21 0546) Temp src: Oral (09/21 0546) BP: 98/50 mmHg (09/21 0546) Pulse Rate: 98 (09/21 0546) Intake/Output from previous day:   Intake/Output from this shift: Total I/O In: -  Out: 100 [Stool:100]  Labs:  Recent Labs  01/29/14 2327 01/30/14 0624  WBC 14.8* 16.0*  HGB 14.9 12.0*  PLT 199 146*  CREATININE 0.69 0.61   Estimated Creatinine Clearance: 139.1 ml/min (by C-G formula based on Cr of 0.61).  Medical History: Past Medical History  Diagnosis Date  . Coronary artery disease   . Depression   . Peripheral vascular disease   . Seizures   . Deep vein thrombophlebitis of left leg   . Deep vein thrombosis of right lower extremity   . Psoriasis   . Arthritis   . DVT (deep venous thrombosis)     multiple times  . Weakness of both legs   . Peripheral neuropathy   . Venous stasis   . Hyperplasia of prostate   . BPH (benign prostatic hyperplasia)   . Hyperlipidemia     Medications:  Scheduled:  . docusate sodium  100 mg Oral BID  . enoxaparin (LOVENOX) injection  100 mg Subcutaneous Once  . enoxaparin (LOVENOX) injection  120 mg Subcutaneous Q12H  . FLUoxetine  20 mg Oral q morning - 10a  . gabapentin  600 mg Oral TID  . [START ON 01/31/2014] Influenza vac split quadrivalent PF  0.5 mL Intramuscular Tomorrow-1000  . levETIRAcetam  1,500 mg Oral TID  . liver oil-zinc oxide  1 application Topical BID  . phenytoin  100 mg Oral 4 times per day  . piperacillin-tazobactam (ZOSYN)  IV  3.375 g Intravenous Q8H  . simvastatin  10 mg Oral q1800  .  tamsulosin  0.4 mg Oral Daily  . vancomycin  1,250 mg Intravenous Q12H  . vitamin B-12  1,000 mcg Oral Daily   Infusions:  . sodium chloride 100 mL/hr at 01/30/14 0559   Assessment: 25 yoM admitted from [facility] 9/20 with c/o continued penile pain, found to have presumed urosepsis. Recent admit 8/16-8/25 with perforated transverse colon with R colectomy and end ileostomy performed 8/16. Pt seen in ED 9/12 for leg pain, and on 9/16 for abd/penile pain. Noted some growth on urine Cx performed 9/16, this was not treated as patient has Foley and this was suspected to be colonization. Pharmacy originally consulted to dose aztreonam and vancomycin. Gram negative coverage now changed to Zosyn per d/w [facility] MD.  PCN listed as allergy but pt has no Hx of reaction, his brother and mother do. Pt tolerated 8 days Zosyn last month, and outpatient cefdinir last year.  Antiinfectives 9/21 >> aztreonam x1 9/21 >> levofloxacin x1 9/21 >> vancomycin >> 9/21 >> Zosyn >>  Labs / vitals Tmax: 104.9 WBCs: 16k (85% neutrophils) Renal: SCr 0.61, CrCl >100 ml/min CG, 99 ml/min N  Microbiology Previous admission 8/16 blood x2: NGF 8/16 urine: NGF 8/17 MRSA PCR: negative 9/12 blood x2: NGF 9/16 urine: 10k citrobacter (pansensitive)  Current admission 9/20 blood x2: sent 9/20 urine: sent  9/21 MRSA PCR:  negative  9/21: D1 vancomycin 1500mg  x1 then 1250mg  iv q12hr (per obesity nomogram) / Zosyn Extended Infusion for sepsis (source: urine vs abdomen vs pna). Originally started on aztreonam/LVQ d/t vanc allergy, changed to Zosyn after discussng allergy Hx with [facility] MD.   Goal of Therapy:  Vancomycin trough level 15-20 mcg/ml Zosyn per indication and renal function  Plan:  - continue vancomycin 1250mg  IV q12h - stop aztreonam and levofloxacin - start Zosyn 3.375G IV q8h, each dose to be infused over 4 hours - vancomycin trough at steady state due to obesity dosing - follow-up clinical course, culture  results, renal function - follow-up antibiotic de-escalation and length of therapy  Thank you for the consult.  Currie Paris, PharmD, BCPS Pager: [phone] Pharmacy: [phone] 01/30/2014 11:32 AM

## 2014-01-30 NOTE — Progress Notes (Signed)
PROGRESS NOTE    [name] QIH:[id number] DOB: [birth year] DOA: 01/29/2014 PCP: Merrilee Seashore, MD  HPI/Brief narrative 63 year old male patient with history of perforated and ischemic transverse colon, s/p emergency laparotomy, transverse and right colectomy with end ileostomy and mucous fistula by Dr. 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 He also noticed some leak from the colostomy site. In the ED, fever 10 82F, UA +.  Assessment/Plan:  1. Sepsis, present on admission: Source: Complicated UTI from Foley, R/O intra-abdominal source-? Proctocolitis, ? HCAP versus ATX. IVF. Initially started empirically on IV vancomycin, aztreonam and levofloxacin. As per discussion with pharmacy, has received Zosyn without allergy. Transitioned to vancomycin and Zosyn. DC'd levofloxacin. Followup blood, urine culture results. Check C. difficile PCR. CT abdomen without abscess. 2. Complicated UTI: Discontinued Foley. Continue Zosyn per pharmacy pending culture results. 3. History of recurrent DVT: As discussed with pharmacy, patient on chronic phenytoin which is likely to decrease effectiveness of Xarelto and similarly Eliquis. For now, DC Xarelto and start full dose Lovenox. Will discuss with hematology. Limited options.? Long-term Lovenox. 4. History of seizures: Continue phenytoin and Keppra. 5. S/P exploratory laparotomy, transverse and right colectomy with end ileostomy and mucous fistula 12/25/13: Requested surgery and WOC consultation for management.   Code Status: Full Family Communication: None at bedside Disposition Plan: Return to SNF when medically stable   Consultants:  General surgery  Procedures:  Discontinued Foley catheter  9/21  Antibiotics:  IV aztreonam 9/20x1 dose  IV levofloxacin 9/20x1 dose  IV Zosyn 9/21 >  IV vancomycin 9/21 >   Subjective: Continues to complain of intermittent penile and rectal pain. Denies cough or dyspnea.  Objective: Filed Vitals:   01/30/14 0308 01/30/14 0546 01/30/14 1346 01/30/14 1347  BP: 110/55 98/50 99/48    Pulse: 99 98 79   Temp:  98.1 F (36.7 C) 98.3 F (36.8 C) 99.1 F (37.3 C)  TempSrc:  Oral Oral Axillary  Resp: 18 20 20    Height:  6\' 5"  (1.956 m)    Weight:  119.8 kg (264 lb 1.8 oz)    SpO2: 97% 97% 97%     Intake/Output Summary (Last 24 hours) at 01/30/14 1526 Last data filed at 01/30/14 1300  Gross per 24 hour  Intake      0 ml  Output   1300 ml  Net  -1300 ml   Filed Weights   01/29/14 2355 01/30/14 0546  Weight: 124.739 kg (275 lb) 119.8 kg (264 lb 1.8 oz)     Exam:  General exam: Moderately built and obese middle-aged male lying comfortably supine in bed. Respiratory system: Diminished breath sounds in the bases but otherwise clear to auscultation. No increased work of breathing. Cardiovascular system: S1 & S2 heard, RRR. No JVD, murmurs, gallops, clicks or pedal edema. Gastrointestinal system: Abdomen is nondistended, soft and nontender. Normal bowel sounds heard. Midline wound with wound VAC-not removed. LUQ mucous fistula intact. RLQ ileostomy intact with no stool leak. Central nervous system: Alert and oriented. No focal neurological deficits. Extremities: Symmetric 5 x 5 power.   Data Reviewed: Basic Metabolic Panel:  Recent Labs Lab 01/29/14 2327 01/30/14 0624  NA 133* 133*  K 4.7 4.0  CL 96 103  CO2 23 20  GLUCOSE 150* 104*  BUN 11 10  CREATININE 0.69  0.61  CALCIUM 8.9 7.8*   Liver Function Tests:  Recent Labs Lab 01/29/14 2327 01/30/14 0624  AST 44* 33  ALT 45 44  ALKPHOS 231* 183*  BILITOT 0.3 0.5  PROT 7.5 5.6*  ALBUMIN 2.7* 2.0*   No results found for this basename: LIPASE, AMYLASE,  in the last  168 hours No results found for this basename: AMMONIA,  in the last 168 hours CBC:  Recent Labs Lab 01/29/14 2327 01/30/14 0624  WBC 14.8* 16.0*  NEUTROABS 13.4* 13.6*  HGB 14.9 12.0*  HCT 44.4 36.5*  MCV 91.7 93.1  PLT 199 146*   Cardiac Enzymes: No results found for this basename: CKTOTAL, CKMB, CKMBINDEX, TROPONINI,  in the last 168 hours BNP (last 3 results)  Recent Labs  10/19/13 1630  PROBNP 79.0   CBG: No results found for this basename: GLUCAP,  in the last 168 hours  Recent Results (from the past 240 hour(s))  CULTURE, BLOOD (ROUTINE X 2)     Status: None   Collection Time    01/21/14  7:30 AM      Result Value Ref Range Status   Specimen Description BLOOD RIGHT WRIST   Final   Special Requests BOTTLES DRAWN AEROBIC AND ANAEROBIC 5CC   Final   Culture  Setup Time     Final   Value: 01/21/2014 15:15     Performed at [facility]   Culture     Final   Value: NO GROWTH 5 DAYS     Performed at [facility]   Report Status 01/27/2014 FINAL   Final  CULTURE, BLOOD (ROUTINE X 2)     Status: None   Collection Time    01/21/14  7:35 AM      Result Value Ref Range Status   Specimen Description BLOOD LEFT ARM   Final   Special Requests BOTTLES DRAWN AEROBIC AND ANAEROBIC 10CC   Final   Culture  Setup Time     Final   Value: 01/21/2014 15:20     Performed at [facility]   Culture     Final   Value: NO GROWTH 5 DAYS     Note: Culture results may be compromised due to an excessive volume of blood received in culture bottles.     Performed at [facility]   Report Status 01/27/2014 FINAL   Final  URINE CULTURE     Status: None   Collection Time    01/25/14  8:26 AM      Result Value Ref Range Status   Specimen Description URINE, CATHETERIZED   Final   Special Requests NONE   Final   Culture  Setup Time     Final   Value: 01/25/2014 11:56     Performed at [facility]     Final   Value: 10,000  COLONIES/ML     Performed at [facility]   Culture     Final   Value: CITROBACTER KOSERI     Performed at [facility]   Report Status 01/27/2014 FINAL   Final   Organism ID, Bacteria CITROBACTER KOSERI   Final  MRSA PCR SCREENING     Status: None   Collection Time    01/30/14  6:44 AM      Result Value Ref Range Status   MRSA by PCR NEGATIVE  NEGATIVE Final   Comment:  The GeneXpert MRSA Assay (FDA     approved for NASAL specimens     only), is one component of a     comprehensive MRSA colonization     surveillance program. It is not     intended to diagnose MRSA     infection nor to guide or     monitor treatment for     MRSA infections.          Studies: Ct Abdomen Pelvis W Contrast  01/30/2014   CLINICAL DATA:  Fever.  EXAM: CT ABDOMEN AND PELVIS WITH CONTRAST  TECHNIQUE: Multidetector CT imaging of the abdomen and pelvis was performed using the standard protocol following bolus administration of intravenous contrast.  CONTRAST:  180mL OMNIPAQUE IOHEXOL 300 MG/ML  SOLN  COMPARISON:  CT scan of January 21, 2014.  FINDINGS: Consolidation is noted posteriorly in the right lower lobe consistent with pneumonia or subsegmental atelectasis, with small associated pleural effusion. No definite osseous abnormality is noted.  No gallstones are noted. The low density seen in the right hepatic lobe on prior exam appears to have resolved. Stable cyst is noted in the spleen. Stable mild splenomegaly is noted. The pancreas appears normal. Adrenal glands appear normal. No hydronephrosis or renal obstruction is noted. No renal or ureteral calculi are noted. The patient is status post right hemicolectomy. Ileostomy is again noted in the right lower quadrant. 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 There is again noted significant wall thickening of this fistula which is worse compared to  prior exam and is concerning for proctocolitis. There also appears to be substantial wall thickening of the urinary bladder which may be due to lack of distension, but cystitis cannot be excluded. Foley catheter noted on prior exam has been removed. No abnormal fluid collection or definite abscess is noted. Mild bilateral fat containing inguinal hernias are noted. No significant adenopathy is noted. There is continued presence of left common and external iliac veins which are diminutive in caliber with associated calcifications suggesting sequela of chronic deep venous thrombosis.  IMPRESSION: Mild consolidation is noted posteriorly in the right lung base concerning for pneumonia or atelectasis.  Status post right hemicolectomy ileostomy seen in the right lower quadrant. 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 There is wall thickening of the mucous fistula which appears to be worse in the sigmoid colon and rectal region compared to prior exam, suggesting worsening proctocolitis.  No abnormal fluid collection or definite abscess is noted.  Foley catheter has been removed. Significant wall thickening of the urinary bladder is noted with possible surrounding inflammation which may be due to lack of distension, but cystitis cannot be excluded. Clinical correlation is recommended.   Electronically Signed   By: Sabino Dick M.D.   On: 01/30/2014 10:14   Dg Chest Port 1 View  01/30/2014   CLINICAL DATA:  Penile pain.  Fever.  EXAM: PORTABLE CHEST - 1 VIEW  COMPARISON:  01/21/2014  FINDINGS: No cardiomegaly for technique. Stable upper mediastinal contours. Chronic Mild elevation of the right diaphragm.  Interstitial coarsening without edema or consolidation. No effusion or pneumothorax.  IMPRESSION: No active disease.   Electronically Signed   By: Jorje Guild M.D.   On: 01/30/2014 00:27        Scheduled Meds: . docusate  sodium  100 mg Oral  BID  . enoxaparin (LOVENOX) injection  120 mg Subcutaneous Q12H  . FLUoxetine  20 mg Oral q morning - 10a  . gabapentin  600 mg Oral TID  . [START ON 01/31/2014] Influenza vac split quadrivalent PF  0.5 mL Intramuscular Tomorrow-1000  . levETIRAcetam  1,500 mg Oral TID  . liver oil-zinc oxide  1 application Topical BID  . phenytoin  100 mg Oral 4 times per day  . piperacillin-tazobactam (ZOSYN)  IV  3.375 g Intravenous Q8H  . simvastatin  10 mg Oral q1800  . tamsulosin  0.4 mg Oral Daily  . vancomycin  1,250 mg Intravenous Q12H  . vitamin B-12  1,000 mcg Oral Daily   Continuous Infusions: . sodium chloride 100 mL/hr at 01/30/14 0559    Principal Problem:   Fever Active Problems:   History of DVT (deep vein thrombosis)   Seizure   UTI (lower urinary tract infection)   Hyperglycemia    Time spent: 38 minutes    Brixton Schnapp, MD, FACP, FHM. [facility] Pager [phone]  If 7PM-7AM, please contact night-coverage [email or web address] Password [email or web address] 01/30/2014, 3:26 PM    LOS: 1 day

## 2014-01-30 NOTE — Consult Note (Signed)
WOC wound consult note Reason for Consult: evaluation of ostomy leakage and at the time of my visit surgery team also seeing this patient.  Wound type: midline surgical wound  Pressure Ulcer POA: No Measurement: 12cm x 5cm x 0.2cm  Wound bed:100% granulation tissue, beefy red Drainage (amount, consistency, odor) oozing with dressing change Periwound:intact  Dressing procedure/placement/frequency: DC NPWT VAC dressing as this wound has fully granulated to the surface of the skin.  Hydrogel daily.    WOC ostomy consult note Stoma type/location: RLQ, end ileostomy.  Leaking all over the place when I arrived. Barrier cream to the the right side of the abdomen.   Stomal assessment/size: 1" x 1 3/4" oval shaped, flush with the skin, pink, moist. In a skin fold Peristomal assessment: denudation, circumferentially that extends from stoma 2cm   Treatment options for stomal/peristomal skin: treated peristomal denudation with stoma pectin powder, "crusted" with tap water. Barrier ring used around the stoma to fill the creases   Output liquid green stool.  Increased volumes.  Ostomy pouching: 1pc.flat used with 2" barrier ring.  Education provided:  Discussed options for pouching with patient, he is somewhat flat but will answer my questions.  He focused a lot on his midline wound.  I asked him about the use of barrier rings in the past but he states they used them one time but stopped.   MF: LLQ, minimal output.  Unclear but the stool around it is dry and crusty.  I have placed stoma cap over this and left additional ones in the room for use.  Also left mini pouch in the room should the stoma cap not work well.   Not clear if we may need to place the ileostomy to drainage bag based on his output. Also not clear if staff either at the SNF or inpatient may need to empty this more frequently.  Pt is not able to tell staff clearly when he feels the pouch is filling.  [place] team to follow along with you for  assistance with trouble shooting ostomy pouching situation.  [place], [place]

## 2014-01-30 NOTE — Progress Notes (Signed)
Nutrition Brief Note  Malnutrition Screening Tool result is inaccurate.  Please consult if nutrition needs are identified.  Sadiyah Kangas F Bria Sparr MS RD LDN Clinical Dietitian Pager:[phone]   

## 2014-01-30 NOTE — H&P (Signed)
[facility] History and Physical  Bruce LICHTY PNT:[id number] DOB: October 18, 1950 DOA: 01/29/2014  Referring physician: ER physician. PCP: Merrilee Seashore, MD   Chief Complaint: Penile pain and fever.  HPI: [name] is a 63 y.o. male with history of seizures, recurrent DVT who was recently admitted to hospital last month for perforated transverse colon with ischemia and had laparotomy with transverse and right-sided colectomy presents to the ER with complaints of fever and penile pain. Patient states he has been having anal pain off and on for last week and has started developing some fever since yesterday morning. Patient also has been having some rectal pain. Denies any chest pain shortness of breath nausea vomiting. Has noticed some leak from the colostomy bag area. In the ER patient was found to be febrile with temperatures around 104F which improved with Tylenol. Blood cultures were obtained. UA shows features for possible UTI. Patient has chronic indwelling catheter since surgery. Patient had similar complaints last week at that time patient had no fever and had his Foley catheter changed. Patient has has been started he was admitted for further management. Patient is initially mildly tachycardic which improved with fluids.   Review of Systems: As presented in the history of presenting illness, rest negative.  Past Medical History  Diagnosis Date  . Coronary artery disease   . Depression   . Peripheral vascular disease   . Seizures   . Deep vein thrombophlebitis of left leg   . Deep vein thrombosis of right lower extremity   . Psoriasis   . Arthritis   . DVT (deep venous thrombosis)     multiple times  . Weakness of both legs   . Peripheral neuropathy   . Venous stasis   . Hyperplasia of prostate   . BPH (benign prostatic hyperplasia)   . Hyperlipidemia    Past Surgical History  Procedure Laterality Date  . Colonoscopy  03/26/2012    Procedure: COLONOSCOPY;   Surgeon: Beryle Beams, MD;  Location: [facility];  Service: Endoscopy;  Laterality: N/A;  . Tonsillectomy      as child  . Flexible sigmoidoscopy N/A 12/09/2013    Procedure: FLEXIBLE SIGMOIDOSCOPY;  Surgeon: Beryle Beams, MD;  Location: [facility];  Service: Endoscopy;  Laterality: N/A;  . Laparotomy N/A 12/25/2013    Procedure: EXPLORATORY LAPAROTOMY/Transverse and right coloectomy/ end  ileostomy and mucus  fistula;  Surgeon: Ralene Ok, MD;  Location: [facility];  Service: General;  Laterality: N/A;   Social History:  reports that he quit smoking about 31 years ago. His smoking use included Pipe. He does not have any smokeless tobacco history on file. He reports that he does not drink alcohol or use illicit drugs. Where does patient live nursing home. Can patient participate in ADLs? Not sure.  Allergies  Allergen Reactions  . Penicillins     Mother and brother have had severe allergic reactions, no personal reaction    Family History:  Family History  Problem Relation Age of Onset  . Vision loss Father   . Hypertension Father   . Diabetes Father   . Heart disease Father   . Cancer Father     colon  . Diabetes Mother   . Hypertension Mother   . Heart disease Mother   . Dementia Mother   . Cancer Mother     breast ca  . Diabetes Sister   . Hyperlipidemia Sister   . Hypertension Sister   . Diabetes Brother   .  Kidney disease Brother   . Heart disease Brother       Prior to Admission medications   Medication Sig Start Date End Date Taking? Authorizing Provider  Calcium Carb-Cholecalciferol (CALCIUM-VITAMIN D) 600-400 MG-UNIT TABS Take 1 tablet by mouth daily. For calcium supplement   Yes Historical Provider, MD  FLUoxetine (PROZAC) 20 MG capsule Take 20 mg by mouth every morning. For depression   Yes Historical Provider, MD  gabapentin (NEURONTIN) 600 MG tablet Take 600 mg by mouth 3 (three) times daily.   Yes Historical Provider, MD  levETIRAcetam (KEPPRA) 750 MG  tablet Take 1,500 mg by mouth 3 (three) times daily. For seizures   Yes Historical Provider, MD  Multiple Vitamin (MULTIVITAMIN WITH MINERALS) TABS Take 1 tablet by mouth daily.   Yes Historical Provider, MD  phenytoin (DILANTIN) 100 MG ER capsule Take 100 mg by mouth 3 (three) times daily. Give 1 capsule by mouth three times daily on Monday, Wednesday, and Friday.   Yes Historical Provider, MD  phenytoin (DILANTIN) 100 MG ER capsule Take 100 mg by mouth 4 (four) times daily. To be taken four times a day on Sunday, Tuesday, Thursday, and Saturday   Yes Historical Provider, MD  pravastatin (PRAVACHOL) 20 MG tablet Take 20 mg by mouth daily. For HLD   Yes Historical Provider, MD  rivaroxaban (XARELTO) 20 MG TABS tablet Take 20 mg by mouth daily with supper.   Yes Historical Provider, MD  tamsulosin (FLOMAX) 0.4 MG CAPS capsule Take 0.4 mg by mouth daily. For BPH 03/21/13  Yes Historical Provider, MD  vitamin B-12 (CYANOCOBALAMIN) 1000 MCG tablet Take 1,000 mcg by mouth daily. For supplement   Yes Historical Provider, MD  Zinc Oxide (SECURA EXTRA PROTECTIVE) 30.6 % CREA Apply 1 application topically 2 (two) times daily.   Yes Historical Provider, MD  acetaminophen (TYLENOL) 500 MG tablet Take 500 mg by mouth 2 (two) times daily as needed for moderate pain.     Historical Provider, MD  alum & mag hydroxide-simeth (MAALOX/MYLANTA) 200-200-20 MG/5ML suspension Take 30 mLs by mouth every 2 (two) hours as needed for indigestion or heartburn.     Historical Provider, MD  docusate sodium (COLACE) 100 MG capsule Take 1 capsule (100 mg total) by mouth 2 (two) times daily. 10/19/13   Carmin Muskrat, MD  HYDROcodone-acetaminophen (NORCO/VICODIN) 5-325 MG per tablet Take 1-2 tablets by mouth every 6 (six) hours as needed for moderate pain. 01/03/14   Emina Riebock, NP  polyethylene glycol (MIRALAX / GLYCOLAX) packet Take 17 g by mouth daily as needed. For constipation 01/03/14   Erby Pian, NP  traMADol (ULTRAM) 50  MG tablet Take 50 mg by mouth every 6 (six) hours as needed for moderate pain.    Historical Provider, MD    Physical Exam: Filed Vitals:   01/30/14 0030 01/30/14 0100 01/30/14 0144 01/30/14 0308  BP: 119/56 120/67  110/55  Pulse:  106  99  Temp:   102.5 F (39.2 C)   TempSrc:      Resp:    18  Height:      Weight:      SpO2:  96%  97%     General:  Well-developed and nourished.  Eyes: Anicteric no pallor.  ENT: No discharge from ears eyes nose mouth.  Neck: No mass felt.  Cardiovascular: S1-S2 heard.  Respiratory: No rhonchi or crepitations.  Abdomen: Colostomy bag and wound VAC seen. There is mild leak around the colostomy bag area.  Skin: Colostomy  bag and wound VAC. Mild leak around the colostomy negative.  Musculoskeletal: No edema.  Psychiatric: Appears normal.  Neurologic: Alert awake oriented to time place and person. Moves all extremities.  Labs on Admission:  Basic Metabolic Panel:  Recent Labs Lab 01/29/14 2327  NA 133*  K 4.7  CL 96  CO2 23  GLUCOSE 150*  BUN 11  CREATININE 0.69  CALCIUM 8.9   Liver Function Tests:  Recent Labs Lab 01/29/14 2327  AST 44*  ALT 45  ALKPHOS 231*  BILITOT 0.3  PROT 7.5  ALBUMIN 2.7*   No results found for this basename: LIPASE, AMYLASE,  in the last 168 hours No results found for this basename: AMMONIA,  in the last 168 hours CBC:  Recent Labs Lab 01/29/14 2327  WBC 14.8*  NEUTROABS 13.4*  HGB 14.9  HCT 44.4  MCV 91.7  PLT 199   Cardiac Enzymes: No results found for this basename: CKTOTAL, CKMB, CKMBINDEX, TROPONINI,  in the last 168 hours  BNP (last 3 results)  Recent Labs  10/19/13 1630  PROBNP 79.0   CBG: No results found for this basename: GLUCAP,  in the last 168 hours  Radiological Exams on Admission: Dg Chest Port 1 View  01/30/2014   CLINICAL DATA:  Penile pain.  Fever.  EXAM: PORTABLE CHEST - 1 VIEW  COMPARISON:  01/21/2014  FINDINGS: No cardiomegaly for technique. Stable  upper mediastinal contours. Chronic Mild elevation of the right diaphragm.  Interstitial coarsening without edema or consolidation. No effusion or pneumothorax.  IMPRESSION: No active disease.   Electronically Signed   By: Jorje Guild M.D.   On: 01/30/2014 00:27     Assessment/Plan Principal Problem:   Fever Active Problems:   History of DVT (deep vein thrombosis)   Seizure   UTI (lower urinary tract infection)   Hyperglycemia   1. Fever - suspect possible source being UTI but given patient's recent surgery I have ordered CT abdomen and pelvis to rule out any intra-abdominal abscess. Patient's penile pain and peripheral pain is concerning for developing prostatitis. Continue with antibiotics. Follow cultures. May modify surgery in a.m. about patient's admission. 2. Recently admitted for perforated viscus status post transverse and right hemicolectomy - see #1. 3. History of recurrent DVT on xarelto. 4. History of seizures - continue present medications.    Code Status: Full code.  Family Communication: Patient's sister at the bedside.  Disposition Plan: Admit to inpatient.    Zymir Napoli N. [facility] Pager [phone].  If 7PM-7AM, please contact night-coverage [email or web address] Password [facility] 01/30/2014, 4:04 AM

## 2014-01-30 NOTE — Progress Notes (Signed)
Patient ID: [name], male   DOB: 11-Sep-1950, 63 y.o.   MRN: [id number]      [facility]      [address] [place], [place] [address]    Phone: [phone] FAX: [phone]     Subjective: We have been asked to evaluate [name] for complaints of rectal pain. He is well known to our service from this past August following a perforated and ischemic transverse colon. 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 Ralene Ok 12/25/13. The patient denies abdominal pain. The mucus fistula has much less output now, only being changed 1-2 times per week.  He is having difficulties with the ileostomy leaking and causing skin irritation.  He was admitted overnight with fevers, UTI and penile pain.  White count of 14.8K, now 16K.  He has been started on Vanc and Zosyn.  He is tolerating a diet.  A CT of abdomen and pelvis showed the known mucus fistula, proctocolitis, no intra-abdominal abscess.     Objective:  Vital signs:  Filed Vitals:   01/30/14 0100 01/30/14 0144 01/30/14 0308 01/30/14 0546  BP: 120/67  110/55 98/50  Pulse: 106  99 98  Temp:  102.5 F (39.2 C)  98.1 F (36.7 C)  TempSrc:    Oral  Resp:   18 20  Height:    _0  (1.956 m)  Weight:    264 lb 1.8 oz (119.8 kg)  SpO2: 96%  97% 97%    Last BM Date: 01/30/14  Intake/Output   Yesterday:    This shift:  Total I/O In: -  Out: 100 [Stool:100]   Physical Exam: General: Pt awake/alert/oriented x4 in no acute distress  Abdomen: bowel sounds are present, abdomen is soft and non tender.  Midline wound is superficial, granulated, VAC was removed.  LUQ mucus fistula with minimal stool output, surrounding area is intact.  RLQ ileostomy, the stoma is pink and viable, there is excoriation surrounding the stoma.  Rectal exam was performed, external non thrombosed hemorrhoid was noted, no stool in the  rectal vault and no internal hemorrhoids or other obvious sources of rectal pain.       Problem List:   Principal Problem:   Fever Active Problems:   History of DVT (deep vein thrombosis)   Seizure   UTI (lower urinary tract infection)   Hyperglycemia    Results:   Labs: Results for orders placed during the hospital encounter of 01/29/14 (from the past 48 hour(s))  CBC WITH DIFFERENTIAL     Status: Abnormal   Collection Time    01/29/14 11:27 PM      Result Value Ref Range   WBC 14.8 (*) 4.0 - 10.5 K/uL   RBC 4.84  4.22 - 5.81 MIL/uL   Hemoglobin 14.9  13.0 - 17.0 g/dL   HCT 44.4  39.0 - 52.0 %   MCV 91.7  78.0 - 100.0 fL   MCH 30.8  26.0 - 34.0 pg   MCHC 33.6  30.0 - 36.0 g/dL   RDW 13.2  11.5 - 15.5 %   Platelets 199  150 - 400 K/uL   Neutrophils Relative % 90 (*) 43 - 77 %   Neutro Abs 13.4 (*) 1.7 - 7.7 K/uL   Lymphocytes Relative 5 (*) 12 - 46 %   Lymphs Abs 0.7  0.7 - 4.0 K/uL   Monocytes Relative 5  3 - 12 %   Monocytes Absolute 0.7  0.1 - 1.0 K/uL   Eosinophils Relative 0  0 - 5 %   Eosinophils Absolute 0.0  0.0 - 0.7 K/uL   Basophils Relative 0  0 - 1 %   Basophils Absolute 0.0  0.0 - 0.1 K/uL  COMPREHENSIVE METABOLIC PANEL     Status: Abnormal   Collection Time    01/29/14 11:27 PM      Result Value Ref Range   Sodium 133 (*) 137 - 147 mEq/L   Potassium 4.7  3.7 - 5.3 mEq/L   Chloride 96  96 - 112 mEq/L   CO2 23  19 - 32 mEq/L   Glucose, Bld 150 (*) 70 - 99 mg/dL   BUN 11  6 - 23 mg/dL   Creatinine, Ser 0.69  0.50 - 1.35 mg/dL   Calcium 8.9  8.4 - 10.5 mg/dL   Total Protein 7.5  6.0 - 8.3 g/dL   Albumin 2.7 (*) 3.5 - 5.2 g/dL   AST 44 (*) 0 - 37 U/L   Comment: SLIGHT HEMOLYSIS     HEMOLYSIS AT THIS LEVEL MAY AFFECT RESULT   ALT 45  0 - 53 U/L   Alkaline Phosphatase 231 (*) 39 - 117 U/L   Total Bilirubin 0.3  0.3 - 1.2 mg/dL   GFR calc non Af Amer >90  >90 mL/min   GFR calc Af Amer >90  >90 mL/min   Comment: (NOTE)     The eGFR has been  calculated using the CKD EPI equation.     This calculation has not been validated in all clinical situations.     eGFR's persistently <90 mL/min signify possible Chronic Kidney     Disease.   Anion gap 14  5 - 15  LACTIC ACID, PLASMA     Status: None   Collection Time    01/29/14 11:27 PM      Result Value Ref Range   Lactic Acid, Venous 2.0  0.5 - 2.2 mmol/L  OCCULT BLOOD, POC DEVICE     Status: None   Collection Time    01/30/14 12:26 AM      Result Value Ref Range   Fecal Occult Bld NEGATIVE  NEGATIVE  URINALYSIS, ROUTINE W REFLEX MICROSCOPIC     Status: Abnormal   Collection Time    01/30/14 12:27 AM      Result Value Ref Range   Color, Urine YELLOW  YELLOW   APPearance CLOUDY (*) CLEAR   Specific Gravity, Urine 1.022  1.005 - 1.030   pH 8.5 (*) 5.0 - 8.0   Glucose, UA NEGATIVE  NEGATIVE mg/dL   Hgb urine dipstick LARGE (*) NEGATIVE   Bilirubin Urine NEGATIVE  NEGATIVE   Ketones, ur NEGATIVE  NEGATIVE mg/dL   Protein, ur 100 (*) NEGATIVE mg/dL   Urobilinogen, UA 0.2  0.0 - 1.0 mg/dL   Nitrite NEGATIVE  NEGATIVE   Leukocytes, UA MODERATE (*) NEGATIVE  URINE MICROSCOPIC-ADD ON     Status: Abnormal   Collection Time    01/30/14 12:27 AM      Result Value Ref Range   WBC, UA 21-50  <3 WBC/hpf   RBC / HPF 3-6  <3 RBC/hpf   Bacteria, UA MANY (*) RARE  PHENYTOIN LEVEL, TOTAL     Status: Abnormal   Collection Time    01/30/14  4:10 AM      Result Value Ref Range   Phenytoin Lvl 4.4 (*)  10.0 - 20.0 ug/mL   Comment: Performed at [facility]     Status: Abnormal   Collection Time    01/30/14  6:24 AM      Result Value Ref Range   Sodium 133 (*) 137 - 147 mEq/L   Potassium 4.0  3.7 - 5.3 mEq/L   Chloride 103  96 - 112 mEq/L   CO2 20  19 - 32 mEq/L   Glucose, Bld 104 (*) 70 - 99 mg/dL   BUN 10  6 - 23 mg/dL   Creatinine, Ser 0.61  0.50 - 1.35 mg/dL   Calcium 7.8 (*) 8.4 - 10.5 mg/dL   Total Protein 5.6 (*) 6.0 - 8.3 g/dL    Albumin 2.0 (*) 3.5 - 5.2 g/dL   AST 33  0 - 37 U/L   ALT 44  0 - 53 U/L   Alkaline Phosphatase 183 (*) 39 - 117 U/L   Total Bilirubin 0.5  0.3 - 1.2 mg/dL   GFR calc non Af Amer >90  >90 mL/min   GFR calc Af Amer >90  >90 mL/min   Comment: (NOTE)     The eGFR has been calculated using the CKD EPI equation.     This calculation has not been validated in all clinical situations.     eGFR's persistently <90 mL/min signify possible Chronic Kidney     Disease.   Anion gap 10  5 - 15  CBC WITH DIFFERENTIAL     Status: Abnormal   Collection Time    01/30/14  6:24 AM      Result Value Ref Range   WBC 16.0 (*) 4.0 - 10.5 K/uL   RBC 3.92 (*) 4.22 - 5.81 MIL/uL   Hemoglobin 12.0 (*) 13.0 - 17.0 g/dL   Comment: REPEATED TO VERIFY     DELTA CHECK NOTED   HCT 36.5 (*) 39.0 - 52.0 %   MCV 93.1  78.0 - 100.0 fL   MCH 30.6  26.0 - 34.0 pg   MCHC 32.9  30.0 - 36.0 g/dL   RDW 13.3  11.5 - 15.5 %   Platelets 146 (*) 150 - 400 K/uL   Comment: SPECIMEN CHECKED FOR CLOTS     REPEATED TO VERIFY     DELTA CHECK NOTED   Neutrophils Relative % 85 (*) 43 - 77 %   Neutro Abs 13.6 (*) 1.7 - 7.7 K/uL   Lymphocytes Relative 5 (*) 12 - 46 %   Lymphs Abs 0.8  0.7 - 4.0 K/uL   Monocytes Relative 10  3 - 12 %   Monocytes Absolute 1.6 (*) 0.1 - 1.0 K/uL   Eosinophils Relative 0  0 - 5 %   Eosinophils Absolute 0.0  0.0 - 0.7 K/uL   Basophils Relative 0  0 - 1 %   Basophils Absolute 0.0  0.0 - 0.1 K/uL  LACTIC ACID, PLASMA     Status: None   Collection Time    01/30/14  6:26 AM      Result Value Ref Range   Lactic Acid, Venous 1.4  0.5 - 2.2 mmol/L  MRSA PCR SCREENING     Status: None   Collection Time    01/30/14  6:44 AM      Result Value Ref Range   MRSA by PCR NEGATIVE  NEGATIVE   Comment:            The GeneXpert MRSA Assay (FDA  approved for NASAL specimens     only), is one component of a     comprehensive MRSA colonization     surveillance program. It is not     intended to diagnose  MRSA     infection nor to guide or     monitor treatment for     MRSA infections.    Imaging / Studies: Ct Abdomen Pelvis W Contrast  01/30/2014   CLINICAL DATA:  Fever.  EXAM: CT ABDOMEN AND PELVIS WITH CONTRAST  TECHNIQUE: Multidetector CT imaging of the abdomen and pelvis was performed using the standard protocol following bolus administration of intravenous contrast.  CONTRAST:  163m OMNIPAQUE IOHEXOL 300 MG/ML  SOLN  COMPARISON:  CT scan of January 21, 2014.  FINDINGS: Consolidation is noted posteriorly in the right lower lobe consistent with pneumonia or subsegmental atelectasis, with small associated pleural effusion. No definite osseous abnormality is noted.  No gallstones are noted. The low density seen in the right hepatic lobe on prior exam appears to have resolved. Stable cyst is noted in the spleen. Stable mild splenomegaly is noted. The pancreas appears normal. Adrenal glands appear normal. No hydronephrosis or renal obstruction is noted. No renal or ureteral calculi are noted. The patient is status post right hemicolectomy. Ileostomy is again noted in the right lower quadrant. 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 There also appears to be substantial wall thickening of the urinary bladder which may be due to lack of distension, but cystitis cannot be excluded. Foley catheter noted on prior exam has been removed. No abnormal fluid collection or definite abscess is noted. Mild bilateral fat containing inguinal hernias are noted. No significant adenopathy is noted. There is continued presence of left common and external iliac veins which are diminutive in caliber with associated calcifications suggesting sequela of chronic deep venous thrombosis.  IMPRESSION: Mild  consolidation is noted posteriorly in the right lung base concerning for pneumonia or atelectasis.  Status post right hemicolectomy ileostomy seen in the right lower quadrant. 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 There is wall thickening of the mucous fistula which appears to be worse in the sigmoid colon and rectal region compared to prior exam, suggesting worsening proctocolitis.  No abnormal fluid collection or definite abscess is noted.  Foley catheter has been removed. Significant wall thickening of the urinary bladder is noted with possible surrounding inflammation which may be due to lack of distension, but cystitis cannot be excluded. Clinical correlation is recommended.   Electronically Signed   By: JSabino DickM.D.   On: 01/30/2014 10:14   Dg Chest Port 1 View  01/30/2014   CLINICAL DATA:  Penile pain.  Fever.  EXAM: PORTABLE CHEST - 1 VIEW  COMPARISON:  01/21/2014  FINDINGS: No cardiomegaly for technique. Stable upper mediastinal contours. Chronic Mild elevation of the right diaphragm.  Interstitial coarsening without edema or consolidation. No effusion or pneumothorax.  IMPRESSION: No active disease.   Electronically Signed   By: JJorje GuildM.D.   On: 01/30/2014 00:27    Medications / Allergies:  Scheduled Meds: . docusate sodium  100 mg Oral BID  . enoxaparin (LOVENOX) injection  120 mg Subcutaneous Q12H  .  FLUoxetine  20 mg Oral q morning - 10a  . gabapentin  600 mg Oral TID  . [START ON 01/31/2014] Influenza vac split quadrivalent PF  0.5 mL Intramuscular Tomorrow-1000  . levETIRAcetam  1,500 mg Oral TID  . liver oil-zinc oxide  1 application Topical BID  . phenytoin  100 mg Oral 4 times per day  . piperacillin-tazobactam (ZOSYN)  IV  3.375 g Intravenous Q8H  . simvastatin  10 mg Oral q1800  . tamsulosin  0.4 mg Oral Daily  . vancomycin  1,250 mg Intravenous Q12H  . vitamin B-12  1,000 mcg  Oral Daily   Continuous Infusions: . sodium chloride 100 mL/hr at 01/30/14 0559   PRN Meds:.acetaminophen, acetaminophen, alum & mag hydroxide-simeth, HYDROcodone-acetaminophen, morphine injection, ondansetron (ZOFRAN) IV, ondansetron, polyethylene glycol, traMADol  Antibiotics: Anti-infectives   Start     Dose/Rate Route Frequency Ordered Stop   01/30/14 2200  levofloxacin (LEVAQUIN) IVPB 750 mg  Status:  Discontinued     750 mg 100 mL/hr over 90 Minutes Intravenous Every 24 hours 01/30/14 0444 01/30/14 1120   01/30/14 1800  vancomycin (VANCOCIN) 1,250 mg in sodium chloride 0.9 % 250 mL IVPB     1,250 mg 166.7 mL/hr over 90 Minutes Intravenous Every 12 hours 01/30/14 0532     01/30/14 1400  aztreonam (AZACTAM) 2 g in dextrose 5 % 50 mL IVPB  Status:  Discontinued     2 g 100 mL/hr over 30 Minutes Intravenous 3 times per day 01/30/14 0444 01/30/14 1120   01/30/14 1400  piperacillin-tazobactam (ZOSYN) IVPB 3.375 g     3.375 g 12.5 mL/hr over 240 Minutes Intravenous Every 8 hours 01/30/14 1120     01/30/14 0545  vancomycin (VANCOCIN) 1,500 mg in sodium chloride 0.9 % 500 mL IVPB     1,500 mg 250 mL/hr over 120 Minutes Intravenous  Once 01/30/14 0531 01/30/14 0759   01/29/14 2345  levofloxacin (LEVAQUIN) IVPB 750 mg     750 mg 100 mL/hr over 90 Minutes Intravenous  Once 01/29/14 2337 01/30/14 0240   01/29/14 2345  aztreonam (AZACTAM) 2 g in dextrose 5 % 50 mL IVPB     2 g 100 mL/hr over 30 Minutes Intravenous  Once 01/29/14 2337 01/30/14 0353        Assessment/Plan Exploratory Laparotomy Transverse and 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 Ralene Ok 12/25/13 Fever UTI -WOC at bedside to assist with ileostomy -DC VAC and start BID wet to dry dressing changes -?can the foley be removed -further management per primary team, not much role for surgery at this point.  Will obtain a c diff PCR given fevers, recent atbx, hospitalization and diarrhea -will  check on him tomorrow  Erby Pian, [facility] Pager [phone](7A-4:30P) For consults and floor pages call [phone](7A-4:30P)  01/30/2014 12:44 PM

## 2014-01-30 NOTE — Progress Notes (Signed)
ANTICOAGULATION CONSULT NOTE - Initial Consult  Pharmacy Consult for enoxparin Indication: DVT  Allergies  Allergen Reactions  . Penicillins     Mother and brother have had severe allergic reactions, no personal reaction    Patient Measurements: Height: 6\' 5"  (195.6 cm) Weight: 264 lb 1.8 oz (119.8 kg) IBW/kg (Calculated) : 89.1  Vital Signs: Temp: 98.1 F (36.7 C) (09/21 0546) Temp src: Oral (09/21 0546) BP: 98/50 mmHg (09/21 0546) Pulse Rate: 98 (09/21 0546)  Labs:  Recent Labs  01/29/14 2327 01/30/14 0624  HGB 14.9 12.0*  HCT 44.4 36.5*  PLT 199 146*  CREATININE 0.69 0.61    Estimated Creatinine Clearance: 139.1 ml/min (by C-G formula based on Cr of 0.61).   Medical History: Past Medical History  Diagnosis Date  . Coronary artery disease   . Depression   . Peripheral vascular disease   . Seizures   . Deep vein thrombophlebitis of left leg   . Deep vein thrombosis of right lower extremity   . Psoriasis   . Arthritis   . DVT (deep venous thrombosis)     multiple times  . Weakness of both legs   . Peripheral neuropathy   . Venous stasis   . Hyperplasia of prostate   . BPH (benign prostatic hyperplasia)   . Hyperlipidemia     Medications:  Scheduled:  . docusate sodium  100 mg Oral BID  . enoxaparin (LOVENOX) injection  100 mg Subcutaneous Once  . enoxaparin (LOVENOX) injection  120 mg Subcutaneous Q12H  . FLUoxetine  20 mg Oral q morning - 10a  . gabapentin  600 mg Oral TID  . [START ON 01/31/2014] Influenza vac split quadrivalent PF  0.5 mL Intramuscular Tomorrow-1000  . levETIRAcetam  1,500 mg Oral TID  . liver oil-zinc oxide  1 application Topical BID  . phenytoin  100 mg Oral 4 times per day  . piperacillin-tazobactam (ZOSYN)  IV  3.375 g Intravenous Q8H  . simvastatin  10 mg Oral q1800  . tamsulosin  0.4 mg Oral Daily  . vancomycin  1,250 mg Intravenous Q12H  . vitamin B-12  1,000 mcg Oral Daily   Infusions:  . sodium chloride 100 mL/hr  at 01/30/14 0559    Assessment: 50 yoM admitted 9/20 from SNF with rule out sepsis, source: urine vs abdomen vs pneumonia.   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 Noted patient also on chronic phenytoin for history of seizures, this is known to be a potent inducer of CYP 3A4 which is an enzyme that metabolizes Xarelto. This thereby decreases systemic concentration of Xarelto.   Xarelto appears to be started in July of this year as patient had been on warfarin since the early 2000s, but due to its interaction with phenytoin, has been difficult to keep INR within therapeutic range.   Per discussion of these interactions with [facility] MD, Pharmacy has been consulted to dose enoxaparin for chronic DVT and Xarelto will be discontinued.   Hgb 12.0 which is decreased from admit but appears at patient's baseline  plts down to 146k, which is below patient's baseline  CrCl > 30 ml/min  No bleeding has been noted  Goal of Therapy:  Anti-Xa level 0.6-1 units/ml 4hrs after LMWH dose given Monitor platelets by anticoagulation protocol: Yes   Plan:  - enoxaparin 100mg  SQ x1 at 1200 - start enoxaparin 120mg  SQ q12h (1mg /kg q12h) at 2200 - CBC in AM and at least q72h while inpatient - monitor renal  function - pharmacy will follow-up daily  Thank you for the consult.  Currie Paris, PharmD, BCPS Pager: [phone] Pharmacy: [phone] 01/30/2014 11:47 AM

## 2014-01-30 NOTE — ED Provider Notes (Signed)
CSN: [id number]     Arrival date & time 01/29/14  2221 History   First MD Initiated Contact with Patient 01/29/14 2236     Chief Complaint  Patient presents with  . Penis Pain  . Fever     (Consider location/radiation/quality/duration/timing/severity/associated sxs/prior Treatment) HPI [name] is a 63 y.o. male who is status post right hemicolectomy with ileostomy on August 16 who presents today for evaluation of fever and penis pain. Patient was seen September 16 for penis pain. Workup on Sep 16- Urine culture showed Citrobacter and blood culture showed no growth. Patient's pain was improved with changing his Foley in ED. He presents today with the same pain also with a rectal temperature of 104.9. He characterizes his pain as a constant burning at the end of his penis. He also reports constant rectal pain. Nothing makes the pain better or worse. Denies cp, SOB, abd pain, n/v/d/c.  Past Medical History  Diagnosis Date  . Coronary artery disease   . Depression   . Peripheral vascular disease   . Seizures   . Deep vein thrombophlebitis of left leg   . Deep vein thrombosis of right lower extremity   . Psoriasis   . Arthritis   . DVT (deep venous thrombosis)     multiple times  . Weakness of both legs   . Peripheral neuropathy   . Venous stasis   . Hyperplasia of prostate   . BPH (benign prostatic hyperplasia)   . Hyperlipidemia    Past Surgical History  Procedure Laterality Date  . Colonoscopy  03/26/2012    Procedure: COLONOSCOPY;  Surgeon: Beryle Beams, MD;  Location: [facility];  Service: Endoscopy;  Laterality: N/A;  . Tonsillectomy      as child  . Flexible sigmoidoscopy N/A 12/09/2013    Procedure: FLEXIBLE SIGMOIDOSCOPY;  Surgeon: Beryle Beams, MD;  Location: [facility];  Service: Endoscopy;  Laterality: N/A;  . Laparotomy N/A 12/25/2013    Procedure: EXPLORATORY LAPAROTOMY/Transverse and right coloectomy/ end  ileostomy and mucus  fistula;  Surgeon: Ralene Ok, MD;  Location: [facility] OR;  Service: General;  Laterality: N/A;   Family History  Problem Relation Age of Onset  . Vision loss Father   . Hypertension Father   . Diabetes Father   . Heart disease Father   . Cancer Father     colon  . Diabetes Mother   . Hypertension Mother   . Heart disease Mother   . Dementia Mother   . Cancer Mother     breast ca  . Diabetes Sister   . Hyperlipidemia Sister   . Hypertension Sister   . Diabetes Brother   . Kidney disease Brother   . Heart disease Brother    History  Substance Use Topics  . Smoking status: Former Smoker    Types: Pipe    Quit date: 03/26/1982  . Smokeless tobacco: Not on file  . Alcohol Use: No    Review of Systems  Constitutional: Positive for fever.  HENT: Negative for sore throat.   Eyes: Negative for visual disturbance.  Respiratory: Negative for shortness of breath.   Cardiovascular: Negative for chest pain.  Gastrointestinal: Negative for abdominal pain.  Endocrine: Negative for polyuria.  Genitourinary: Positive for penile pain. Negative for dysuria, discharge, penile swelling, scrotal swelling and testicular pain.  Skin: Negative for rash.  Neurological: Negative for headaches.      Allergies  Penicillins  Home Medications   Prior to Admission  medications   Medication Sig Start Date End Date Taking? Authorizing Provider  Calcium Carb-Cholecalciferol (CALCIUM-VITAMIN D) 600-400 MG-UNIT TABS Take 1 tablet by mouth daily. For calcium supplement   Yes Historical Provider, MD  FLUoxetine (PROZAC) 20 MG capsule Take 20 mg by mouth every morning. For depression   Yes Historical Provider, MD  gabapentin (NEURONTIN) 600 MG tablet Take 600 mg by mouth 3 (three) times daily.   Yes Historical Provider, MD  levETIRAcetam (KEPPRA) 750 MG tablet Take 1,500 mg by mouth 3 (three) times daily. For seizures   Yes Historical Provider, MD  Multiple Vitamin (MULTIVITAMIN WITH MINERALS) TABS Take 1 tablet by mouth  daily.   Yes Historical Provider, MD  phenytoin (DILANTIN) 100 MG ER capsule Take 100 mg by mouth 3 (three) times daily. Give 1 capsule by mouth three times daily on Monday, Wednesday, and Friday.   Yes Historical Provider, MD  phenytoin (DILANTIN) 100 MG ER capsule Take 100 mg by mouth 4 (four) times daily. To be taken four times a day on Sunday, Tuesday, Thursday, and Saturday   Yes Historical Provider, MD  pravastatin (PRAVACHOL) 20 MG tablet Take 20 mg by mouth daily. For HLD   Yes Historical Provider, MD  rivaroxaban (XARELTO) 20 MG TABS tablet Take 20 mg by mouth daily with supper.   Yes Historical Provider, MD  tamsulosin (FLOMAX) 0.4 MG CAPS capsule Take 0.4 mg by mouth daily. For BPH 03/21/13  Yes Historical Provider, MD  vitamin B-12 (CYANOCOBALAMIN) 1000 MCG tablet Take 1,000 mcg by mouth daily. For supplement   Yes Historical Provider, MD  Zinc Oxide (SECURA EXTRA PROTECTIVE) 30.6 % CREA Apply 1 application topically 2 (two) times daily.   Yes Historical Provider, MD  acetaminophen (TYLENOL) 500 MG tablet Take 500 mg by mouth 2 (two) times daily as needed for moderate pain.     Historical Provider, MD  alum & mag hydroxide-simeth (MAALOX/MYLANTA) 200-200-20 MG/5ML suspension Take 30 mLs by mouth every 2 (two) hours as needed for indigestion or heartburn.     Historical Provider, MD  docusate sodium (COLACE) 100 MG capsule Take 1 capsule (100 mg total) by mouth 2 (two) times daily. 10/19/13   Carmin Muskrat, MD  HYDROcodone-acetaminophen (NORCO/VICODIN) 5-325 MG per tablet Take 1-2 tablets by mouth every 6 (six) hours as needed for moderate pain. 01/03/14   Emina Riebock, NP  polyethylene glycol (MIRALAX / GLYCOLAX) packet Take 17 g by mouth daily as needed. For constipation 01/03/14   Erby Pian, NP  traMADol (ULTRAM) 50 MG tablet Take 50 mg by mouth every 6 (six) hours as needed for moderate pain.    Historical Provider, MD   BP 132/60  Pulse 120  Temp(Src) 104.9 F (40.5 C) (Rectal)   Resp 16  Ht 6\' 5"  (1.956 m)  Wt 275 lb (124.739 kg)  BMI 32.60 kg/m2  SpO2 97% Physical Exam  Nursing note and vitals reviewed. Constitutional: He is oriented to person, place, and time. He appears well-developed and well-nourished.  HENT:  Head: Normocephalic and atraumatic.  Mouth/Throat: Oropharynx is clear and moist.  Eyes: Conjunctivae are normal. Pupils are equal, round, and reactive to light. Right eye exhibits no discharge. Left eye exhibits no discharge. No scleral icterus.  Neck: Neck supple.  Cardiovascular: Normal rate, regular rhythm and normal heart sounds.   Pulmonary/Chest: Effort normal and breath sounds normal. No respiratory distress. He has no wheezes. He has no rales.  Abdominal: Soft. There is no tenderness.  Colostomy bag in place  draining brown feces/fluid. Wound vac in place. Abdomen soft with no obvious lesions other than mild irritation from recent colostomy bag placement. No abdominal tenderness or distention  Genitourinary:  Rectal exam yielded normal sphincter tone, tender prostate, no obvious masses or obstructions. No fissures or appreciable hemorrhoids. Penis is circumcised, not tender to palpation, no discharge. Scrotum is not swollen, no testicular pain. No obvious lesions or deformities appreciated. No evidence of Fournier's.  Musculoskeletal: He exhibits no tenderness.  Neurological: He is alert and oriented to person, place, and time.  Cranial Nerves II-XII grossly intact  Skin: Skin is warm and dry. No rash noted.  Psychiatric: He has a normal mood and affect.    ED Course  Procedures (including critical care time) Labs Review Labs Reviewed  CBC WITH DIFFERENTIAL - Abnormal; Notable for the following:    WBC 14.8 (*)    Neutrophils Relative % 90 (*)    Neutro Abs 13.4 (*)    Lymphocytes Relative 5 (*)    All other components within normal limits  COMPREHENSIVE METABOLIC PANEL - Abnormal; Notable for the following:    Sodium 133 (*)     Glucose, Bld 150 (*)    Albumin 2.7 (*)    AST 44 (*)    Alkaline Phosphatase 231 (*)    All other components within normal limits  URINE CULTURE  CULTURE, BLOOD (ROUTINE X 2)  CULTURE, BLOOD (ROUTINE X 2)  LACTIC ACID, PLASMA  URINALYSIS, ROUTINE W REFLEX MICROSCOPIC    Imaging Review Dg Chest Port 1 View  01/30/2014   CLINICAL DATA:  Penile pain.  Fever.  EXAM: PORTABLE CHEST - 1 VIEW  COMPARISON:  01/21/2014  FINDINGS: No cardiomegaly for technique. Stable upper mediastinal contours. Chronic Mild elevation of the right diaphragm.  Interstitial coarsening without edema or consolidation. No effusion or pneumothorax.  IMPRESSION: No active disease.   Electronically Signed   By: Jorje Guild M.D.   On: 01/30/2014 00:27     EKG Interpretation None     Meds given in ED:  Medications  sodium chloride 0.9 % bolus 3,741 mL (3,741 mLs Intravenous New Bag/Given 01/30/14 0002)    Followed by  0.9 %  sodium chloride infusion (not administered)  aztreonam (AZACTAM) 2 g in dextrose 5 % 50 mL IVPB (not administered)  sodium chloride 0.9 % bolus 1,000 mL (0 mLs Intravenous Stopped 01/30/14 0007)  acetaminophen (TYLENOL) tablet 650 mg (650 mg Oral Given 01/29/14 2316)  levofloxacin (LEVAQUIN) IVPB 750 mg (750 mg Intravenous New Bag/Given 01/30/14 0002)  HYDROcodone-acetaminophen (NORCO/VICODIN) 5-325 MG per tablet 1 tablet (1 tablet Oral Given 01/30/14 0001)  HYDROmorphone (DILAUDID) injection 0.5 mg (0.5 mg Intravenous Given 01/30/14 0139)    New Prescriptions   No medications on file   Filed Vitals:   01/29/14 2355 01/30/14 0000 01/30/14 0030 01/30/14 0100  BP:  132/60 119/56 120/67  Pulse:  120  106  Temp:      TempSrc:      Resp:      Height: 6\' 5"  (1.956 m)     Weight: 275 lb (124.739 kg)     SpO2:  97%  96%     MDM  Patient has rectal temp of 104.9, 117 HR on my exam. Sepsis protocol activated. Tylenol given for fever, Fluids started. Pain managed in ED. Urinalysis shows many  bacteria moderate leukocytes large hemoglobin, leukocytosis of 14.8.   Digital rectal exam performed by myself and repeated by Dr. Florina Ou, consistent with prostatitis.  No evidence of Fournier's. Levaquin started in ED to cover for potential Urosepsis.  Discussed ED course with Dr. Florina Ou, decision made to have patient admitted to hospital.  Prior to patient admission, I discussed and reviewed this case with Dr.Wofford and Dr. Florina Ou.     Final diagnoses:  Sepsis, due to unspecified organism  UTI (lower urinary tract infection)         Verl Dicker, PA-C 01/30/14 1414

## 2014-01-30 NOTE — ED Provider Notes (Signed)
Medical screening examination/treatment/procedure(s) were conducted as a shared visit with non-physician practitioner(s) and myself.  I personally evaluated the patient during the encounter.  Prostate tender. Colostomy bag draining brown fluid. Abdomen soft, nontender. Indwelling Foley catheter placement; no penile tenderness.    Wynetta Fines, MD 01/30/14 [phone]

## 2014-01-30 NOTE — Progress Notes (Signed)
Clinical Social Work Department BRIEF PSYCHOSOCIAL ASSESSMENT 01/30/2014  Patient:  [name], [name]     Account Number:  [id number]     Admit date:  01/29/2014  Clinical Social Worker:  Ulyess Blossom  Date/Time:  01/30/2014 12:15 AM  Referred by:  Physician  Date Referred:  01/30/2014 Referred for  SNF Placement   Other Referral:   Interview type:  Patient Other interview type:   and patient sister at bedside    PSYCHOSOCIAL DATA Living Status:  FACILITY Admitted from facility:  [place] Level of care:  [place] Primary support name:  [name]/sister/[phone] Primary support relationship to patient:  [place] Degree of support available:   strong    CURRENT CONCERNS Current Concerns  Post-Acute Placement   Other Concerns:    SOCIAL WORK ASSESSMENT / PLAN CSW received referral that pt admitted from [facility].    CSW met with pt and pt sister at bedside. CSW introduced self and explained role. Pt confirmed that he is a resident at [facility]. CSW provided support counseling as pt and pt sister discussed the medical difficulites that pt has had. Pt shared that he is satisfied at [facility]. Pt sister shared that pt is currently paying privately because he was unable to participate with therapy secondary to his wound vac and ostomy leaking, but per pt sister report, wound vac is being removed and pt and pt sister hopeful that pt will be able to participate with therapy and if pt has 3 night inpatient stay then pt may qualify for Medicare to start covering at SNF. Pt and pt sister are agreeable to pt returning to [facility] when medically ready for discharge.    CSW completed FL2 and contacted [facility] Facility agreeable to pt returning and state that if pt begins to work with PT then they can attempt to bill Medicare if pt has three night inpatient stay.     CSW to continue to follow and assist with pt return to [facility] when pt medically ready for discharge.   Assessment/plan status:  Psychosocial Support/Ongoing Assessment of Needs Other assessment/ plan:   discharge planning   Information/referral to community resources:   Referral back to [place]: Pt alert and oriented x 4. Pt sister supportive and actively involved in pt care. Pt is pleased with [facility] and support provided as pt has been to other facilities in the past where he was not happy with the care. Pt plans to return to [facility] upon discharge.   Alison Murray, MSW, [place] [facility] [phone]

## 2014-01-31 ENCOUNTER — Ambulatory Visit: Payer: Self-pay | Admitting: Neurology

## 2014-01-31 DIAGNOSIS — R569 Unspecified convulsions: Secondary | ICD-10-CM

## 2014-01-31 DIAGNOSIS — T83511A Infection and inflammatory reaction due to indwelling urethral catheter, initial encounter: Secondary | ICD-10-CM | POA: Diagnosis not present

## 2014-01-31 DIAGNOSIS — A419 Sepsis, unspecified organism: Secondary | ICD-10-CM | POA: Diagnosis present

## 2014-01-31 DIAGNOSIS — N390 Urinary tract infection, site not specified: Secondary | ICD-10-CM

## 2014-01-31 LAB — CBC
HEMATOCRIT: 34.8 % — AB (ref 39.0–52.0)
Hemoglobin: 11.4 g/dL — ABNORMAL LOW (ref 13.0–17.0)
MCH: 30.5 pg (ref 26.0–34.0)
MCHC: 32.8 g/dL (ref 30.0–36.0)
MCV: 93 fL (ref 78.0–100.0)
Platelets: 150 10*3/uL (ref 150–400)
RBC: 3.74 MIL/uL — ABNORMAL LOW (ref 4.22–5.81)
RDW: 13.5 % (ref 11.5–15.5)
WBC: 9.1 10*3/uL (ref 4.0–10.5)

## 2014-01-31 LAB — BASIC METABOLIC PANEL
Anion gap: 10 (ref 5–15)
BUN: 9 mg/dL (ref 6–23)
CO2: 20 mEq/L (ref 19–32)
CREATININE: 0.58 mg/dL (ref 0.50–1.35)
Calcium: 8.4 mg/dL (ref 8.4–10.5)
Chloride: 107 mEq/L (ref 96–112)
GFR calc non Af Amer: >90 mL/min (ref >90)
Glucose, Bld: 82 mg/dL (ref 70–99)
Potassium: 4 mEq/L (ref 3.7–5.3)
Sodium: 137 mEq/L (ref 137–147)

## 2014-01-31 LAB — PROTIME-INR
INR: 1.2 (ref 0.00–1.49)
Prothrombin Time: 15.2 seconds (ref 11.6–15.2)

## 2014-01-31 MED ORDER — WARFARIN VIDEO
Freq: Once | Status: AC
  Administered 2014-02-01: 12:00:00

## 2014-01-31 MED ORDER — WARFARIN SODIUM 7.5 MG PO TABS
7.5000 mg | ORAL_TABLET | Freq: Once | ORAL | Status: AC
  Administered 2014-01-31: 7.5 mg via ORAL
  Filled 2014-01-31: qty 1

## 2014-01-31 MED ORDER — COUMADIN BOOK
Freq: Once | Status: AC
  Administered 2014-01-31: 18:00:00
  Filled 2014-01-31: qty 1

## 2014-01-31 MED ORDER — WARFARIN - PHARMACIST DOSING INPATIENT
Freq: Every day | Status: DC
  Administered 2014-02-01: 18:00:00

## 2014-01-31 MED ORDER — BELLADONNA ALKALOIDS-OPIUM 16.2-60 MG RE SUPP
1.0000 | Freq: Once | RECTAL | Status: AC
  Administered 2014-01-31: 1 via RECTAL
  Filled 2014-01-31: qty 1

## 2014-01-31 NOTE — Progress Notes (Signed)
Patient ID: [name], male   DOB: [birth year], 63 y.o.   MRN: [id number]     [address] [place], [place] [address]    Phone: [phone] FAX: [phone]     Subjective: No further leaking, rectal pain is better with pain meds.  c diff PCR is negative.   Objective:  Vital signs:  Filed Vitals:   01/30/14 1346 01/30/14 1347 01/30/14 2014 01/31/14 0419  BP: 99/48  106/50 96/54  Pulse: 79  80 66  Temp: 98.3 F (36.8 C) 99.1 F (37.3 C) 98.6 F (37 C) 98.3 F (36.8 C)  TempSrc: Oral Axillary Oral Oral  Resp: _0 Height:      Weight:    267 lb 10.2 oz (121.4 kg)  SpO2: 97%  98% 96%    Last BM Date: 01/30/14  Intake/Output   Yesterday:  09/21 0701 - 09/22 0700 In: 2573.3 [P.O.:240; I.V.:1683.3; IV Piggyback:650] Out: 1275 [Urine:2950; Stool:600] This shift:    I/O last 3 completed shifts: In: 2573.3 [P.O.:240; I.V.:1683.3; IV Piggyback:650] Out: 1700 [Urine:2950; Stool:600]     Physical Exam: General: Pt awake/alert/oriented x4 in no acute distress  Abdomen: bowel sounds are present, abdomen is soft and non tender. 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 RLQ ileostomy, the stoma is pink and viable, there is excoriation surrounding the stoma.   Problem List:   Principal Problem:   Fever Active Problems:   History of DVT (deep vein thrombosis)   Seizure   UTI (lower urinary tract infection)   Hyperglycemia    Results:   Labs: Results for orders placed during the hospital encounter of 01/29/14 (from the past 48 hour(s))  CBC WITH DIFFERENTIAL     Status: Abnormal   Collection Time    01/29/14 11:27 PM      Result Value Ref Range   WBC 14.8 (*) 4.0 - 10.5 K/uL   RBC 4.84  4.22 - 5.81 MIL/uL   Hemoglobin 14.9  13.0 - 17.0 g/dL   HCT 44.4  39.0 - 52.0 %   MCV 91.7  78.0 - 100.0 fL   MCH 30.8  26.0 - 34.0 pg   MCHC  33.6  30.0 - 36.0 g/dL   RDW 13.2  11.5 - 15.5 %   Platelets 199  150 - 400 K/uL   Neutrophils Relative % 90 (*) 43 - 77 %   Neutro Abs 13.4 (*) 1.7 - 7.7 K/uL   Lymphocytes Relative 5 (*) 12 - 46 %   Lymphs Abs 0.7  0.7 - 4.0 K/uL   Monocytes Relative 5  3 - 12 %   Monocytes Absolute 0.7  0.1 - 1.0 K/uL   Eosinophils Relative 0  0 - 5 %   Eosinophils Absolute 0.0  0.0 - 0.7 K/uL   Basophils Relative 0  0 - 1 %   Basophils Absolute 0.0  0.0 - 0.1 K/uL  COMPREHENSIVE METABOLIC PANEL     Status: Abnormal   Collection Time    01/29/14 11:27 PM      Result Value Ref Range   Sodium 133 (*) 137 - 147 mEq/L   Potassium 4.7  3.7 - 5.3 mEq/L   Chloride 96  96 - 112 mEq/L   CO2 23  19 - 32 mEq/L   Glucose, Bld 150 (*) 70 - 99 mg/dL   BUN  11  6 - 23 mg/dL   Creatinine, Ser 0.69  0.50 - 1.35 mg/dL   Calcium 8.9  8.4 - 10.5 mg/dL   Total Protein 7.5  6.0 - 8.3 g/dL   Albumin 2.7 (*) 3.5 - 5.2 g/dL   AST 44 (*) 0 - 37 U/L   Comment: SLIGHT HEMOLYSIS     HEMOLYSIS AT THIS LEVEL MAY AFFECT RESULT   ALT 45  0 - 53 U/L   Alkaline Phosphatase 231 (*) 39 - 117 U/L   Total Bilirubin 0.3  0.3 - 1.2 mg/dL   GFR calc non Af Amer >90  >90 mL/min   GFR calc Af Amer >90  >90 mL/min   Comment: (NOTE)     The eGFR has been calculated using the CKD EPI equation.     This calculation has not been validated in all clinical situations.     eGFR's persistently <90 mL/min signify possible Chronic Kidney     Disease.   Anion gap 14  5 - 15  CULTURE, BLOOD (ROUTINE X 2)     Status: None   Collection Time    01/29/14 11:27 PM      Result Value Ref Range   Specimen Description BLOOD RIGHT ANTECUBITAL     Special Requests BOTTLES DRAWN AEROBIC AND ANAEROBIC 3CC     Culture  Setup Time       Value: 01/30/2014 09:41     Performed at [facility]   Culture       Value:        BLOOD CULTURE RECEIVED NO GROWTH TO DATE CULTURE WILL BE HELD FOR 5 DAYS BEFORE ISSUING A FINAL NEGATIVE REPORT     Performed  at [facility]   Report Status PENDING    CULTURE, BLOOD (ROUTINE X 2)     Status: None   Collection Time    01/29/14 11:27 PM      Result Value Ref Range   Specimen Description BLOOD LEFT ANTECUBITAL     Special Requests BOTTLES DRAWN AEROBIC AND ANAEROBIC 5CC     Culture  Setup Time       Value: 01/30/2014 09:41     Performed at [facility]   Culture       Value:        BLOOD CULTURE RECEIVED NO GROWTH TO DATE CULTURE WILL BE HELD FOR 5 DAYS BEFORE ISSUING A FINAL NEGATIVE REPORT     Performed at [facility]   Report Status PENDING    LACTIC ACID, PLASMA     Status: None   Collection Time    01/29/14 11:27 PM      Result Value Ref Range   Lactic Acid, Venous 2.0  0.5 - 2.2 mmol/L  OCCULT BLOOD, POC DEVICE     Status: None   Collection Time    01/30/14 12:26 AM      Result Value Ref Range   Fecal Occult Bld NEGATIVE  NEGATIVE  URINALYSIS, ROUTINE W REFLEX MICROSCOPIC     Status: Abnormal   Collection Time    01/30/14 12:27 AM      Result Value Ref Range   Color, Urine YELLOW  YELLOW   APPearance CLOUDY (*) CLEAR   Specific Gravity, Urine 1.022  1.005 - 1.030   pH 8.5 (*) 5.0 - 8.0   Glucose, UA NEGATIVE  NEGATIVE mg/dL   Hgb urine dipstick LARGE (*) NEGATIVE   Bilirubin Urine NEGATIVE  NEGATIVE  Ketones, ur NEGATIVE  NEGATIVE mg/dL   Protein, ur 100 (*) NEGATIVE mg/dL   Urobilinogen, UA 0.2  0.0 - 1.0 mg/dL   Nitrite NEGATIVE  NEGATIVE   Leukocytes, UA MODERATE (*) NEGATIVE  URINE CULTURE     Status: None   Collection Time    01/30/14 12:27 AM      Result Value Ref Range   Specimen Description URINE, CLEAN CATCH     Special Requests Normal     Culture  Setup Time       Value: 01/30/2014 09:55     Performed at [facility]       Value: >=100,000 COLONIES/ML     Performed at [facility]   Culture       Value: [place]     Performed at [facility]   Report Status PENDING    URINE  MICROSCOPIC-ADD ON     Status: Abnormal   Collection Time    01/30/14 12:27 AM      Result Value Ref Range   WBC, UA 21-50  <3 WBC/hpf   RBC / HPF 3-6  <3 RBC/hpf   Bacteria, UA MANY (*) RARE  PHENYTOIN LEVEL, TOTAL     Status: Abnormal   Collection Time    01/30/14  4:10 AM      Result Value Ref Range   Phenytoin Lvl 4.4 (*) 10.0 - 20.0 ug/mL   Comment: Performed at [facility] PANEL     Status: Abnormal   Collection Time    01/30/14  6:24 AM      Result Value Ref Range   Sodium 133 (*) 137 - 147 mEq/L   Potassium 4.0  3.7 - 5.3 mEq/L   Chloride 103  96 - 112 mEq/L   CO2 20  19 - 32 mEq/L   Glucose, Bld 104 (*) 70 - 99 mg/dL   BUN 10  6 - 23 mg/dL   Creatinine, Ser 0.61  0.50 - 1.35 mg/dL   Calcium 7.8 (*) 8.4 - 10.5 mg/dL   Total Protein 5.6 (*) 6.0 - 8.3 g/dL   Albumin 2.0 (*) 3.5 - 5.2 g/dL   AST 33  0 - 37 U/L   ALT 44  0 - 53 U/L   Alkaline Phosphatase 183 (*) 39 - 117 U/L   Total Bilirubin 0.5  0.3 - 1.2 mg/dL   GFR calc non Af Amer >90  >90 mL/min   GFR calc Af Amer >90  >90 mL/min   Comment: (NOTE)     The eGFR has been calculated using the CKD EPI equation.     This calculation has not been validated in all clinical situations.     eGFR's persistently <90 mL/min signify possible Chronic Kidney     Disease.   Anion gap 10  5 - 15  CBC WITH DIFFERENTIAL     Status: Abnormal   Collection Time    01/30/14  6:24 AM      Result Value Ref Range   WBC 16.0 (*) 4.0 - 10.5 K/uL   RBC 3.92 (*) 4.22 - 5.81 MIL/uL   Hemoglobin 12.0 (*) 13.0 - 17.0 g/dL   Comment: REPEATED TO VERIFY     DELTA CHECK NOTED   HCT 36.5 (*) 39.0 - 52.0 %   MCV 93.1  78.0 - 100.0 fL   MCH 30.6  26.0 - 34.0 pg   MCHC 32.9  30.0 -  36.0 g/dL   RDW 13.3  11.5 - 15.5 %   Platelets 146 (*) 150 - 400 K/uL   Comment: SPECIMEN CHECKED FOR CLOTS     REPEATED TO VERIFY     DELTA CHECK NOTED   Neutrophils Relative % 85 (*) 43 - 77 %   Neutro Abs 13.6 (*) 1.7 - 7.7  K/uL   Lymphocytes Relative 5 (*) 12 - 46 %   Lymphs Abs 0.8  0.7 - 4.0 K/uL   Monocytes Relative 10  3 - 12 %   Monocytes Absolute 1.6 (*) 0.1 - 1.0 K/uL   Eosinophils Relative 0  0 - 5 %   Eosinophils Absolute 0.0  0.0 - 0.7 K/uL   Basophils Relative 0  0 - 1 %   Basophils Absolute 0.0  0.0 - 0.1 K/uL  HEMOGLOBIN A1C     Status: None   Collection Time    01/30/14  6:24 AM      Result Value Ref Range   Hemoglobin A1C 5.1  <5.7 %   Comment: (NOTE)                                                                               According to the ADA Clinical Practice Recommendations for 2011, when     HbA1c is used as a screening test:      >=6.5%   Diagnostic of Diabetes Mellitus               (if abnormal result is confirmed)     5.7-6.4%   Increased risk of developing Diabetes Mellitus     References:Diagnosis and Classification of Diabetes Mellitus,Diabetes     TKWI,0973,53(GDJME 1):S62-S69 and Standards of Medical Care in             Diabetes - 2011,Diabetes Care,2011,34 (Suppl 1):S11-S61.   Mean Plasma Glucose 100  <117 mg/dL   Comment: Performed at [facility] ACID, PLASMA     Status: None   Collection Time    01/30/14  6:26 AM      Result Value Ref Range   Lactic Acid, Venous 1.4  0.5 - 2.2 mmol/L  MRSA PCR SCREENING     Status: None   Collection Time    01/30/14  6:44 AM      Result Value Ref Range   MRSA by PCR NEGATIVE  NEGATIVE   Comment:            The GeneXpert MRSA Assay (FDA     approved for NASAL specimens     only), is one component of a     comprehensive MRSA colonization     surveillance program. It is not     intended to diagnose MRSA     infection nor to guide or     monitor treatment for     MRSA infections.  CLOSTRIDIUM DIFFICILE BY PCR     Status: None   Collection Time    01/30/14 12:31 PM      Result Value Ref Range   C difficile by pcr NEGATIVE  NEGATIVE   Comment: Performed at [facility]  CBC     Status:  Abnormal    Collection Time    01/31/14  4:06 AM      Result Value Ref Range   WBC 9.1  4.0 - 10.5 K/uL   RBC 3.74 (*) 4.22 - 5.81 MIL/uL   Hemoglobin 11.4 (*) 13.0 - 17.0 g/dL   HCT 34.8 (*) 39.0 - 52.0 %   MCV 93.0  78.0 - 100.0 fL   MCH 30.5  26.0 - 34.0 pg   MCHC 32.8  30.0 - 36.0 g/dL   RDW 13.5  11.5 - 15.5 %   Platelets 150  150 - 400 K/uL  BASIC METABOLIC PANEL     Status: None   Collection Time    01/31/14  4:06 AM      Result Value Ref Range   Sodium 137  137 - 147 mEq/L   Potassium 4.0  3.7 - 5.3 mEq/L   Chloride 107  96 - 112 mEq/L   CO2 20  19 - 32 mEq/L   Glucose, Bld 82  70 - 99 mg/dL   BUN 9  6 - 23 mg/dL   Creatinine, Ser 0.58  0.50 - 1.35 mg/dL   Calcium 8.4  8.4 - 10.5 mg/dL   GFR calc non Af Amer >90  >90 mL/min   GFR calc Af Amer >90  >90 mL/min   Comment: (NOTE)     The eGFR has been calculated using the CKD EPI equation.     This calculation has not been validated in all clinical situations.     eGFR's persistently <90 mL/min signify possible Chronic Kidney     Disease.   Anion gap 10  5 - 15    Imaging / Studies: Ct Abdomen Pelvis W Contrast  01/30/2014   CLINICAL DATA:  Fever.  EXAM: CT ABDOMEN AND PELVIS WITH CONTRAST  TECHNIQUE: Multidetector CT imaging of the abdomen and pelvis was performed using the standard protocol following bolus administration of intravenous contrast.  CONTRAST:  141m OMNIPAQUE IOHEXOL 300 MG/ML  SOLN  COMPARISON:  CT scan of January 21, 2014.  FINDINGS: Consolidation is noted posteriorly in the right lower lobe consistent with pneumonia or subsegmental atelectasis, with small associated pleural effusion. No definite osseous abnormality is noted.  No gallstones are noted. The low density seen in the right hepatic lobe on prior exam appears to have resolved. Stable cyst is noted in the spleen. Stable mild splenomegaly is noted. The pancreas appears normal. Adrenal glands appear normal. No hydronephrosis or renal obstruction is noted. No  renal or ureteral calculi are noted. The patient is status post right hemicolectomy. Ileostomy is again noted in the right lower quadrant. 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 There also appears to be substantial wall thickening of the urinary bladder which may be due to lack of distension, but cystitis cannot be excluded. Foley catheter noted on prior exam has been removed. No abnormal fluid collection or definite abscess is noted. Mild bilateral fat containing inguinal hernias are noted. No significant adenopathy is noted. There is continued presence of left common and external iliac veins which are diminutive in caliber with associated calcifications suggesting sequela of chronic deep venous thrombosis.  IMPRESSION: Mild consolidation is noted posteriorly in the right lung base concerning for pneumonia or atelectasis.  Status post  right hemicolectomy ileostomy seen in the right lower quadrant. 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 There is wall thickening of the mucous fistula which appears to be worse in the sigmoid colon and rectal region compared to prior exam, suggesting worsening proctocolitis.  No abnormal fluid collection or definite abscess is noted.  Foley catheter has been removed. Significant wall thickening of the urinary bladder is noted with possible surrounding inflammation which may be due to lack of distension, but cystitis cannot be excluded. Clinical correlation is recommended.   Electronically Signed   By: Sabino Dick M.D.   On: 01/30/2014 10:14   Dg Chest Port 1 View  01/30/2014   CLINICAL DATA:  Penile pain.  Fever.  EXAM: PORTABLE CHEST - 1 VIEW  COMPARISON:  01/21/2014   FINDINGS: No cardiomegaly for technique. Stable upper mediastinal contours. Chronic Mild elevation of the right diaphragm.  Interstitial coarsening without edema or consolidation. No effusion or pneumothorax.  IMPRESSION: No active disease.   Electronically Signed   By: Jorje Guild M.D.   On: 01/30/2014 00:27    Medications / Allergies:  Scheduled Meds: . enoxaparin (LOVENOX) injection  120 mg Subcutaneous Q12H  . FLUoxetine  20 mg Oral q morning - 10a  . gabapentin  600 mg Oral TID  . Influenza vac split quadrivalent PF  0.5 mL Intramuscular Tomorrow-1000  . levETIRAcetam  1,500 mg Oral TID  . liver oil-zinc oxide  1 application Topical BID  . phenytoin  100 mg Oral 4 times per day on Sun Tue Thu Sat  . phenytoin  100 mg Oral 3 times per day on Mon Wed Fri  . piperacillin-tazobactam (ZOSYN)  IV  3.375 g Intravenous Q8H  . simvastatin  10 mg Oral q1800  . tamsulosin  0.4 mg Oral Daily  . vancomycin  1,250 mg Intravenous Q12H  . vitamin B-12  1,000 mcg Oral Daily   Continuous Infusions:  PRN Meds:.acetaminophen, acetaminophen, alum & mag hydroxide-simeth, HYDROcodone-acetaminophen, morphine injection, ondansetron (ZOFRAN) IV, ondansetron, polyethylene glycol  Antibiotics: Anti-infectives   Start     Dose/Rate Route Frequency Ordered Stop   01/30/14 2200  levofloxacin (LEVAQUIN) IVPB 750 mg  Status:  Discontinued     750 mg 100 mL/hr over 90 Minutes Intravenous Every 24 hours 01/30/14 0444 01/30/14 1120   01/30/14 1800  vancomycin (VANCOCIN) 1,250 mg in sodium chloride 0.9 % 250 mL IVPB     1,250 mg 166.7 mL/hr over 90 Minutes Intravenous Every 12 hours 01/30/14 0532     01/30/14 1400  aztreonam (AZACTAM) 2 g in dextrose 5 % 50 mL IVPB  Status:  Discontinued     2 g 100 mL/hr over 30 Minutes Intravenous 3 times per day 01/30/14 0444 01/30/14 1120   01/30/14 1400  piperacillin-tazobactam (ZOSYN) IVPB 3.375 g     3.375 g 12.5 mL/hr over 240 Minutes Intravenous Every 8 hours  01/30/14 1120     01/30/14 0545  vancomycin (VANCOCIN) 1,500 mg in sodium chloride 0.9 % 500 mL IVPB     1,500 mg 250 mL/hr over 120 Minutes Intravenous  Once 01/30/14 0531 01/30/14 0759   01/29/14 2345  levofloxacin (LEVAQUIN) IVPB 750 mg     750 mg 100 mL/hr over 90 Minutes Intravenous  Once 01/29/14 2337 01/30/14 0240   01/29/14 2345  aztreonam (AZACTAM) 2 g in dextrose 5 % 50 mL IVPB     2 g 100  mL/hr over 30 Minutes Intravenous  Once 01/29/14 2337 01/30/14 0353         Assessment/Plan Exploratory Laparotomy Transverse and 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 Ralene Ok 12/25/13  Fever  UTI  -WOC consult, ostomy care -BID wet to dry dressing changes  -recommend foley removal, discharged from our service with foley due to urinary retention, recommended removal in 1 week.  According to the patient, foley was not removed because of ostomy leaking and mobility.   -scheduled to see Dr. Rosendo Gros yesterday, will need to reschedule -will s/o please call [facility] with questions or concerns.    Erby Pian, [facility] Pager [phone]:30P)   01/31/2014 8:43 AM

## 2014-01-31 NOTE — Progress Notes (Signed)
PROGRESS NOTE    [name] ZOX:[id number] DOB: [birth year] DOA: 01/29/2014 PCP: Merrilee Seashore, MD  HPI/Brief narrative 63 year old male patient with history of perforated and ischemic transverse colon, s/p emergency laparotomy, transverse and right colectomy with end ileostomy and mucous fistula by Dr. 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 He also noticed some leak from the colostomy site. In the ED, fever 10 68F, UA +.  Assessment/Plan:  1. Sepsis, present on admission: Source: Complicated UTI from Foley, R/O intra-abdominal source-? Proctocolitis, ? HCAP(-less likely) versus ATX. IVF. Initially started empirically on IV vancomycin, aztreonam and levofloxacin. As per discussion with pharmacy, has received Zosyn without allergy. Transitioned to vancomycin and Zosyn. DC'd levofloxacin. CT abdomen without abscess. Blood cultures x2: Negative to date. Urine culture: Proteus mirabilis. C. difficile PCR negative. DC vancomycin. 2. Complicated Proteus Mirabilis UTI: Discontinued Foley. Continue Zosyn per pharmacy pending culture results. 3. History of recurrent DVT: As discussed with pharmacy, patient on chronic phenytoin which is likely to decrease effectiveness of Xarelto and similarly Eliquis. For now, DC Xarelto and start full dose Lovenox. Discussed with sister on 9/22 who states that Coumadin actually worked fine until he was at the specific SNF in April where they had difficulty managing his Coumadin and was started on Xarelto. Will start back Coumadin and monitor. 4. History of seizures: Continue phenytoin and Keppra. 5. S/P exploratory laparotomy, transverse and right colectomy with end ileostomy and mucous fistula 12/25/13: Surgery and wound care team  input appreciated. Surgery has signed off.    Code Status: Full Family Communication:  discussed with patient's sister at bedside  Disposition Plan: Return to SNF when medically stable   Consultants:  General surgery  Procedures:  Discontinued Foley catheter 9/21  Antibiotics:  IV aztreonam 9/20x1 dose  IV levofloxacin 9/20x1 dose  IV Zosyn 9/21 >  IV vancomycin 9/21 >   Subjective:  Rectal and penile pain have decreased. Overall feels much better.  Objective: Filed Vitals:   01/30/14 1347 01/30/14 2014 01/31/14 0419 01/31/14 1310  BP:  106/50 96/54 106/60  Pulse:  80 66 100  Temp: 99.1 F (37.3 C) 98.6 F (37 C) 98.3 F (36.8 C) 97.8 F (36.6 C)  TempSrc: Axillary Oral Oral Oral  Resp:  20 18 18   Height:      Weight:   121.4 kg (267 lb 10.2 oz)   SpO2:  98% 96% 97%    Intake/Output Summary (Last 24 hours) at 01/31/14 1420 Last data filed at 01/31/14 1230  Gross per 24 hour  Intake 3003.33 ml  Output   3000 ml  Net   3.33 ml   Filed Weights   01/29/14 2355 01/30/14 0546 01/31/14 0419  Weight: 124.739 kg (275 lb) 119.8 kg (264 lb 1.8 oz) 121.4 kg (267 lb 10.2 oz)     Exam:  General exam: Moderately built and obese middle-aged male lying comfortably supine in bed. Respiratory system: clear to auscultation. No increased work of breathing. Cardiovascular system: S1 & S2 heard, RRR. No JVD, murmurs, gallops, clicks or pedal edema. Gastrointestinal system: Abdomen is nondistended, soft and nontender. Normal bowel sounds heard. Midline wound dressing clean and dry. LUQ mucous fistula intact. RLQ ileostomy intact with no stool leak. Central nervous system: Alert and oriented. No focal neurological deficits. Extremities: Symmetric 5  x 5 power.   Data Reviewed: Basic Metabolic Panel:  Recent Labs Lab 01/29/14 2327 01/30/14 0624 01/31/14 0406  NA 133* 133* 137  K 4.7 4.0 4.0  CL 96 103 107  CO2 23 20 20   GLUCOSE 150* 104* 82  BUN 11 10 9     CREATININE 0.69 0.61 0.58  CALCIUM 8.9 7.8* 8.4   Liver Function Tests:  Recent Labs Lab 01/29/14 2327 01/30/14 0624  AST 44* 33  ALT 45 44  ALKPHOS 231* 183*  BILITOT 0.3 0.5  PROT 7.5 5.6*  ALBUMIN 2.7* 2.0*   No results found for this basename: LIPASE, AMYLASE,  in the last 168 hours No results found for this basename: AMMONIA,  in the last 168 hours CBC:  Recent Labs Lab 01/29/14 2327 01/30/14 0624 01/31/14 0406  WBC 14.8* 16.0* 9.1  NEUTROABS 13.4* 13.6*  --   HGB 14.9 12.0* 11.4*  HCT 44.4 36.5* 34.8*  MCV 91.7 93.1 93.0  PLT 199 146* 150   Cardiac Enzymes: No results found for this basename: CKTOTAL, CKMB, CKMBINDEX, TROPONINI,  in the last 168 hours BNP (last 3 results)  Recent Labs  10/19/13 1630  PROBNP 79.0   CBG: No results found for this basename: GLUCAP,  in the last 168 hours  Recent Results (from the past 240 hour(s))  URINE CULTURE     Status: None   Collection Time    01/25/14  8:26 AM      Result Value Ref Range Status   Specimen Description URINE, CATHETERIZED   Final   Special Requests NONE   Final   Culture  Setup Time     Final   Value: 01/25/2014 11:56     Performed at [place]     Final   Value: 10,000 COLONIES/ML     Performed at [facility]   Culture     Final   Value: CITROBACTER KOSERI     Performed at [facility]   Report Status 01/27/2014 FINAL   Final   Organism ID, Bacteria CITROBACTER KOSERI   Final  CULTURE, BLOOD (ROUTINE X 2)     Status: None   Collection Time    01/29/14 11:27 PM      Result Value Ref Range Status   Specimen Description BLOOD RIGHT ANTECUBITAL   Final   Special Requests BOTTLES DRAWN AEROBIC AND ANAEROBIC 3CC   Final   Culture  Setup Time     Final   Value: 01/30/2014 09:41     Performed at [facility]   Culture     Final   Value:        BLOOD CULTURE RECEIVED NO GROWTH TO DATE CULTURE WILL BE HELD FOR 5 DAYS BEFORE ISSUING A FINAL  NEGATIVE REPORT     Performed at [facility]   Report Status PENDING   Incomplete  CULTURE, BLOOD (ROUTINE X 2)     Status: None   Collection Time    01/29/14 11:27 PM      Result Value Ref Range Status   Specimen Description BLOOD LEFT ANTECUBITAL   Final   Special Requests BOTTLES DRAWN AEROBIC AND ANAEROBIC 5CC   Final   Culture  Setup Time     Final   Value: 01/30/2014 09:41     Performed at [place]     Final   Value:        BLOOD CULTURE  RECEIVED NO GROWTH TO DATE CULTURE WILL BE HELD FOR 5 DAYS BEFORE ISSUING A FINAL NEGATIVE REPORT     Performed at [facility]   Report Status PENDING   Incomplete  URINE CULTURE     Status: None   Collection Time    01/30/14 12:27 AM      Result Value Ref Range Status   Specimen Description URINE, CLEAN CATCH   Final   Special Requests Normal   Final   Culture  Setup Time     Final   Value: 01/30/2014 09:55     Performed at [place]     Final   Value: >=100,000 COLONIES/ML     Performed at [facility]   Culture     Final   Value: PROTEUS MIRABILIS     Performed at [facility]   Report Status PENDING   Incomplete  MRSA PCR SCREENING     Status: None   Collection Time    01/30/14  6:44 AM      Result Value Ref Range Status   MRSA by PCR NEGATIVE  NEGATIVE Final   Comment:            The GeneXpert MRSA Assay (FDA     approved for NASAL specimens     only), is one component of a     comprehensive MRSA colonization     surveillance program. It is not     intended to diagnose MRSA     infection nor to guide or     monitor treatment for     MRSA infections.  CLOSTRIDIUM DIFFICILE BY PCR     Status: None   Collection Time    01/30/14 12:31 PM      Result Value Ref Range Status   C difficile by pcr NEGATIVE  NEGATIVE Final   Comment: Performed at [facility]          Studies: Ct Abdomen Pelvis W Contrast  01/30/2014   CLINICAL DATA:   Fever.  EXAM: CT ABDOMEN AND PELVIS WITH CONTRAST  TECHNIQUE: Multidetector CT imaging of the abdomen and pelvis was performed using the standard protocol following bolus administration of intravenous contrast.  CONTRAST:  129mL OMNIPAQUE IOHEXOL 300 MG/ML  SOLN  COMPARISON:  CT scan of January 21, 2014.  FINDINGS: Consolidation is noted posteriorly in the right lower lobe consistent with pneumonia or subsegmental atelectasis, with small associated pleural effusion. No definite osseous abnormality is noted.  No gallstones are noted. The low density seen in the right hepatic lobe on prior exam appears to have resolved. Stable cyst is noted in the spleen. Stable mild splenomegaly is noted. The pancreas appears normal. Adrenal glands appear normal. No hydronephrosis or renal obstruction is noted. No renal or ureteral calculi are noted. The patient is status post right hemicolectomy. Ileostomy is again noted in the right lower quadrant. 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 There also appears to be substantial wall thickening of the urinary bladder which may be due to lack of distension, but cystitis cannot be excluded. Foley catheter noted on prior exam has been removed. No abnormal fluid collection or definite abscess is noted. Mild bilateral fat containing inguinal hernias are noted. No  significant adenopathy is noted. There is continued presence of left common and external iliac veins which are diminutive in caliber with associated calcifications suggesting sequela of chronic deep venous thrombosis.  IMPRESSION: Mild consolidation is noted posteriorly in the right lung base concerning for pneumonia or atelectasis.  Status post right hemicolectomy ileostomy seen in the right lower quadrant.  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 There is wall thickening of the mucous fistula which appears to be worse in the sigmoid colon and rectal region compared to prior exam, suggesting worsening proctocolitis.  No abnormal fluid collection or definite abscess is noted.  Foley catheter has been removed. Significant wall thickening of the urinary bladder is noted with possible surrounding inflammation which may be due to lack of distension, but cystitis cannot be excluded. Clinical correlation is recommended.   Electronically Signed   By: Sabino Dick M.D.   On: 01/30/2014 10:14   Dg Chest Port 1 View  01/30/2014   CLINICAL DATA:  Penile pain.  Fever.  EXAM: PORTABLE CHEST - 1 VIEW  COMPARISON:  01/21/2014  FINDINGS: No cardiomegaly for technique. Stable upper mediastinal contours. Chronic Mild elevation of the right diaphragm.  Interstitial coarsening without edema or consolidation. No effusion or pneumothorax.  IMPRESSION: No active disease.   Electronically Signed   By: Jorje Guild M.D.   On: 01/30/2014 00:27        Scheduled Meds: . enoxaparin (LOVENOX) injection  120 mg Subcutaneous Q12H  . FLUoxetine  20 mg Oral q morning - 10a  . gabapentin  600 mg Oral TID  . levETIRAcetam  1,500 mg Oral TID  . liver oil-zinc oxide  1 application Topical BID  . phenytoin  100 mg Oral 4 times per day on Sun Tue Thu Sat  . phenytoin  100 mg Oral 3 times per day on Mon Wed Fri  . piperacillin-tazobactam (ZOSYN)  IV  3.375 g Intravenous Q8H  . simvastatin  10 mg Oral q1800  . tamsulosin  0.4 mg Oral Daily  . vancomycin  1,250 mg Intravenous Q12H  . vitamin B-12  1,000 mcg Oral Daily   Continuous Infusions:    Principal Problem:   Fever Active Problems:   History of DVT (deep vein thrombosis)   Seizure   UTI (lower urinary tract infection)   Hyperglycemia    Time spent: 40 minutes    Bruce Supak,  MD, FACP, FHM. [facility] Pager [phone]  If 7PM-7AM, please contact night-coverage [email or web address] Password [email or web address] 01/31/2014, 2:20 PM    LOS: 2 days

## 2014-01-31 NOTE — Consult Note (Signed)
WOC checked on patient ostomy pouching system placed yesterday is working well.  MF stoma cap is controlling mucous from this area. 1pc ostomy supplies and barrier rings ordered for the bedside should the staff need these for change.  I have suggested pouch changes every 2-3 days.   Discussed POC with patient and bedside nurse.  Re consult if needed, will not follow at this time. Thanks  Alto Gandolfo Kellogg, [place] [phone])

## 2014-01-31 NOTE — Progress Notes (Signed)
ANTICOAGULATION CONSULT NOTE - Initial Consult  Pharmacy Consult for Warfarin Indication: DVT  Allergies  Allergen Reactions  . Penicillins     Mother and brother have had severe allergic reactions, no personal reaction    Patient Measurements: Height: 6\' 5"  (195.6 cm) Weight: 267 lb 10.2 oz (121.4 kg) IBW/kg (Calculated) : 89.1  Vital Signs: Temp: 97.8 F (36.6 C) (09/22 1310) Temp src: Oral (09/22 1310) BP: 106/60 mmHg (09/22 1310) Pulse Rate: 100 (09/22 1310)  Labs:  Recent Labs  01/29/14 2327 01/30/14 0624 01/31/14 0406 01/31/14 1500  HGB 14.9 12.0* 11.4*  --   HCT 44.4 36.5* 34.8*  --   PLT 199 146* 150  --   LABPROT  --   --   --  15.2  INR  --   --   --  1.20  CREATININE 0.69 0.61 0.58  --     Estimated Creatinine Clearance: 139.9 ml/min (by C-G formula based on Cr of 0.58).   Medical History: Past Medical History  Diagnosis Date  . Coronary artery disease   . Depression   . Peripheral vascular disease   . Seizures   . Deep vein thrombophlebitis of left leg   . Deep vein thrombosis of right lower extremity   . Psoriasis   . Arthritis   . DVT (deep venous thrombosis)     multiple times  . Weakness of both legs   . Peripheral neuropathy   . Venous stasis   . Hyperplasia of prostate   . BPH (benign prostatic hyperplasia)   . Hyperlipidemia     Medications:  Scheduled:  . enoxaparin (LOVENOX) injection  120 mg Subcutaneous Q12H  . FLUoxetine  20 mg Oral q morning - 10a  . gabapentin  600 mg Oral TID  . levETIRAcetam  1,500 mg Oral TID  . liver oil-zinc oxide  1 application Topical BID  . phenytoin  100 mg Oral 4 times per day on Sun Tue Thu Sat  . phenytoin  100 mg Oral 3 times per day on Mon Wed Fri  . piperacillin-tazobactam (ZOSYN)  IV  3.375 g Intravenous Q8H  . simvastatin  10 mg Oral q1800  . tamsulosin  0.4 mg Oral Daily  . vitamin B-12  1,000 mcg Oral Daily   Infusions:    Assessment:  Pt known to pharmacy from Lovenox  dosing.  Prior to admission, pt on Xarelto for DVT.  Before Xarelto, pt had been on warfarin at one time and family member states therapy worked well.  Due to drug interactions with phenytoin, anticoagulation to be changed back to Warfarin.  Pt remains on Lovenox until INR therapeutic  In April 2015 pt on Warfarin 5mg  daily except 2.5mg  Tue/Sat and in June 2015 pt on warfarin 6mg  daily  Goal of Therapy:  INR 2-3   Plan:  Warfarin 7.5mg  po x 1 tonight Check daily PT/INR Order warfarin education materials  Reighlyn Elmes, Toribio Harbour, PharmD 01/31/2014,4:21 PM

## 2014-02-01 LAB — URINE CULTURE
Colony Count: >=100000
SPECIAL REQUESTS: NORMAL

## 2014-02-01 LAB — CBC
HEMATOCRIT: 39.2 % (ref 39.0–52.0)
Hemoglobin: 12.8 g/dL — ABNORMAL LOW (ref 13.0–17.0)
MCH: 30.6 pg (ref 26.0–34.0)
MCHC: 32.7 g/dL (ref 30.0–36.0)
MCV: 93.8 fL (ref 78.0–100.0)
Platelets: 163 10*3/uL (ref 150–400)
RBC: 4.18 MIL/uL — AB (ref 4.22–5.81)
RDW: 13.5 % (ref 11.5–15.5)
WBC: 6.1 10*3/uL (ref 4.0–10.5)

## 2014-02-01 LAB — PROTIME-INR
INR: 1.27 (ref 0.00–1.49)
Prothrombin Time: 15.9 seconds — ABNORMAL HIGH (ref 11.6–15.2)

## 2014-02-01 MED ORDER — CEFUROXIME AXETIL 250 MG PO TABS
250.0000 mg | ORAL_TABLET | Freq: Two times a day (BID) | ORAL | Status: DC
  Administered 2014-02-01 – 2014-02-02 (×3): 250 mg via ORAL
  Filled 2014-02-01 (×4): qty 1

## 2014-02-01 MED ORDER — METRONIDAZOLE 500 MG PO TABS
500.0000 mg | ORAL_TABLET | Freq: Three times a day (TID) | ORAL | Status: DC
  Administered 2014-02-01 – 2014-02-02 (×3): 500 mg via ORAL
  Filled 2014-02-01 (×5): qty 1

## 2014-02-01 MED ORDER — WARFARIN SODIUM 7.5 MG PO TABS
7.5000 mg | ORAL_TABLET | Freq: Once | ORAL | Status: AC
  Administered 2014-02-01: 7.5 mg via ORAL
  Filled 2014-02-01: qty 1

## 2014-02-01 NOTE — Progress Notes (Signed)
PROGRESS NOTE    [name]:[id number] DOB: 02-Aug-1950 DOA: 01/29/2014 PCP: Merrilee Seashore, MD  HPI/Brief narrative 63 year old male patient with history of perforated and ischemic transverse colon, s/p emergency laparotomy, transverse and right colectomy with end ileostomy and mucous fistula by Dr. 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 He also noticed some leak from the colostomy site. In the ED, fever 10 45F, UA +.  Assessment/Plan:  1. Sepsis, present on admission: Source: Complicated UTI from Foley, R/O intra-abdominal source-? Proctocolitis, ? HCAP(-less likely) versus ATX. IVF. Initially started empirically on IV vancomycin, aztreonam and levofloxacin. As per discussion with pharmacy, has received Zosyn without allergy. Transitioned to vancomycin and Zosyn. DC'd levofloxacin. CT abdomen without abscess. Blood cultures x2: Negative to date. Urine culture: Proteus mirabilis. C. difficile PCR negative. DC vancomycin. Will transition antibiotics to PO Ceftin to cover UTI and add Flagyl for? Proctocolitis. 2. Complicated Proteus Mirabilis UTI: Discontinued Foley. Change Zosyn to oral Ceftin. 3. History of recurrent DVT: As discussed with pharmacy, patient on chronic phenytoin which is likely to decrease effectiveness of Xarelto and similarly Eliquis. For now, DC Xarelto and start full dose Lovenox. Discussed with sister on 9/22 who states that Coumadin actually worked fine until he was at the specific SNF in April where they had difficulty managing his Coumadin and was started on Xarelto. Started back Coumadin and monitor. 4. History of seizures: Continue phenytoin and Keppra. 5. S/P exploratory laparotomy, transverse and right colectomy with end  ileostomy and mucous fistula 12/25/13: Surgery and wound care team input appreciated. Surgery has signed off.    Code Status: Full Family Communication:  discussed with patient's sister at bedside on 9/22 Disposition Plan: Return to SNF possibly 9/24   Consultants:  General surgery-signed off  Procedures:  Discontinued Foley catheter 9/21  Antibiotics:  IV aztreonam 9/20x1 dose  IV levofloxacin 9/20x1 dose  IV Zosyn 9/21 > 9/23  IV vancomycin 9/21 > 9/23  PO Ceftin & Flagyl 9/23 >   Subjective:  Rectal and penile pain have decreased by 80%. Overall feels much better.  Objective: Filed Vitals:   01/31/14 0419 01/31/14 1310 01/31/14 2104 02/01/14 0523  BP: 96/54 106/60 101/54 115/68  Pulse: 66 100 78 81  Temp: 98.3 F (36.8 C) 97.8 F (36.6 C) 98.4 F (36.9 C) 98 F (36.7 C)  TempSrc: Oral Oral Oral Oral  Resp: 18 18 18 18   Height:      Weight: 121.4 kg (267 lb 10.2 oz)   121.3 kg (267 lb 6.7 oz)  SpO2: 96% 97% 98% 98%    Intake/Output Summary (Last 24 hours) at 02/01/14 1235 Last data filed at 02/01/14 1149  Gross per 24 hour  Intake   1390 ml  Output   2200 ml  Net   -810 ml   Filed Weights   01/30/14 0546 01/31/14 0419 02/01/14 0523  Weight: 119.8 kg (264 lb 1.8 oz) 121.4 kg (267 lb 10.2 oz) 121.3 kg (267 lb 6.7 oz)     Exam:  General exam: Moderately built and obese middle-aged male lying comfortably supine in bed. Respiratory system: clear to auscultation. No increased work of breathing. Cardiovascular system: S1 & S2 heard, RRR. No JVD, murmurs, gallops, clicks or pedal edema. Gastrointestinal system: Abdomen is nondistended, soft and nontender. Normal bowel  sounds heard. Midline wound dressing clean and dry. LUQ mucous fistula intact. RLQ ileostomy intact with no stool leak. Central nervous system: Alert and oriented. No focal neurological deficits. Extremities: Symmetric 5 x 5 power.   Data Reviewed: Basic Metabolic Panel:  Recent  Labs Lab 01/29/14 2327 01/30/14 0624 01/31/14 0406  NA 133* 133* 137  K 4.7 4.0 4.0  CL 96 103 107  CO2 23 20 20   GLUCOSE 150* 104* 82  BUN 11 10 9   CREATININE 0.69 0.61 0.58  CALCIUM 8.9 7.8* 8.4   Liver Function Tests:  Recent Labs Lab 01/29/14 2327 01/30/14 0624  AST 44* 33  ALT 45 44  ALKPHOS 231* 183*  BILITOT 0.3 0.5  PROT 7.5 5.6*  ALBUMIN 2.7* 2.0*   No results found for this basename: LIPASE, AMYLASE,  in the last 168 hours No results found for this basename: AMMONIA,  in the last 168 hours CBC:  Recent Labs Lab 01/29/14 2327 01/30/14 0624 01/31/14 0406 02/01/14 0440  WBC 14.8* 16.0* 9.1 6.1  NEUTROABS 13.4* 13.6*  --   --   HGB 14.9 12.0* 11.4* 12.8*  HCT 44.4 36.5* 34.8* 39.2  MCV 91.7 93.1 93.0 93.8  PLT 199 146* 150 163   Cardiac Enzymes: No results found for this basename: CKTOTAL, CKMB, CKMBINDEX, TROPONINI,  in the last 168 hours BNP (last 3 results)  Recent Labs  10/19/13 1630  PROBNP 79.0   CBG: No results found for this basename: GLUCAP,  in the last 168 hours  Recent Results (from the past 240 hour(s))  URINE CULTURE     Status: None   Collection Time    01/25/14  8:26 AM      Result Value Ref Range Status   Specimen Description URINE, CATHETERIZED   Final   Special Requests NONE   Final   Culture  Setup Time     Final   Value: 01/25/2014 11:56     Performed at [place]     Final   Value: 10,000 COLONIES/ML     Performed at [facility]   Culture     Final   Value: CITROBACTER KOSERI     Performed at [facility]   Report Status 01/27/2014 FINAL   Final   Organism ID, Bacteria CITROBACTER KOSERI   Final  CULTURE, BLOOD (ROUTINE X 2)     Status: None   Collection Time    01/29/14 11:27 PM      Result Value Ref Range Status   Specimen Description BLOOD RIGHT ANTECUBITAL   Final   Special Requests BOTTLES DRAWN AEROBIC AND ANAEROBIC 3CC   Final   Culture  Setup Time     Final    Value: 01/30/2014 09:41     Performed at [facility]   Culture     Final   Value:        BLOOD CULTURE RECEIVED NO GROWTH TO DATE CULTURE WILL BE HELD FOR 5 DAYS BEFORE ISSUING A FINAL NEGATIVE REPORT     Performed at [facility]   Report Status PENDING   Incomplete  CULTURE, BLOOD (ROUTINE X 2)     Status: None   Collection Time    01/29/14 11:27 PM      Result Value Ref Range Status   Specimen Description BLOOD LEFT ANTECUBITAL   Final   Special Requests BOTTLES DRAWN AEROBIC AND ANAEROBIC 5CC   Final   Culture  Setup Time     Final   Value: 01/30/2014 09:41     Performed at [facility]   Culture     Final   Value:        BLOOD CULTURE RECEIVED NO GROWTH TO DATE CULTURE WILL BE HELD FOR 5 DAYS BEFORE ISSUING A FINAL NEGATIVE REPORT     Performed at [facility]   Report Status PENDING   Incomplete  URINE CULTURE     Status: None   Collection Time    01/30/14 12:27 AM      Result Value Ref Range Status   Specimen Description URINE, CLEAN CATCH   Final   Special Requests Normal   Final   Culture  Setup Time     Final   Value: 01/30/2014 09:55     Performed at [place]     Final   Value: >=100,000 COLONIES/ML     Performed at [facility]   Culture     Final   Value: PROTEUS MIRABILIS     Performed at [facility]   Report Status 02/01/2014 FINAL   Final   Organism ID, Bacteria PROTEUS MIRABILIS   Final  MRSA PCR SCREENING     Status: None   Collection Time    01/30/14  6:44 AM      Result Value Ref Range Status   MRSA by PCR NEGATIVE  NEGATIVE Final   Comment:            The GeneXpert MRSA Assay (FDA     approved for NASAL specimens     only), is one component of a     comprehensive MRSA colonization     surveillance program. It is not     intended to diagnose MRSA     infection nor to guide or     monitor treatment for     MRSA infections.  CLOSTRIDIUM DIFFICILE BY PCR     Status: None    Collection Time    01/30/14 12:31 PM      Result Value Ref Range Status   C difficile by pcr NEGATIVE  NEGATIVE Final   Comment: Performed at [facility]          Studies: No results found.      Scheduled Meds: . enoxaparin (LOVENOX) injection  120 mg Subcutaneous Q12H  . FLUoxetine  20 mg Oral q morning - 10a  . gabapentin  600 mg Oral TID  . levETIRAcetam  1,500 mg Oral TID  . liver oil-zinc oxide  1 application Topical BID  . phenytoin  100 mg Oral 4 times per day on Sun Tue Thu Sat  . phenytoin  100 mg Oral 3 times per day on Mon Wed Fri  . piperacillin-tazobactam (ZOSYN)  IV  3.375 g Intravenous Q8H  . simvastatin  10 mg Oral q1800  . tamsulosin  0.4 mg Oral Daily  . vitamin B-12  1,000 mcg Oral Daily  . warfarin  7.5 mg Oral ONCE-1800  . Warfarin - Pharmacist Dosing Inpatient   Does not apply q1800   Continuous Infusions:    Principal Problem:   Fever Active Problems:   History of DVT (deep vein thrombosis)   Seizure   UTI (lower urinary tract infection)   Hyperglycemia   Sepsis    Time spent: 30 minutes    HONGALGI,ANAND, MD, FACP, FHM. [facility] Pager [phone]  If 7PM-7AM, please contact  night-coverage [email or web address] Password [email or web address] 02/01/2014, 12:35 PM    LOS: 3 days

## 2014-02-01 NOTE — Progress Notes (Signed)
CSW continuing to follow for disposition planning.  Pt from [facility] and plans to return upon discharge.  Pt evaluated by PT/OT today and recommendation continues to be for SNF placement.  CSW met with pt at bedside. Pt remains agreeable to returning to [facility]. CSW discussed with pt that as long as pt continues to participate in therapy then [employer] can begin billing Medicare again per the facility. Pt shared that he plans to continue therapy upon return to SNF.   CSW updated [facility] and sent updated clinicals.  CSW to continue to follow and facilitate pt discharge needs when pt medically ready for discharge.   Alison Murray, MSW, [place] [employer] [phone]

## 2014-02-01 NOTE — Evaluation (Signed)
Occupational Therapy Evaluation Patient Details Name: [name] MRN: [id number] DOB: 07-16-1950 Today's Date: 02/01/2014    History of Present Illness  63 year-old male patient with history of perforated and ischemic transverse colon, s/p emergency laparotomy, transverse and right colectomy with end ileostomy and mucous fistula by Dr. 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 He also noticed some leak from the colostomy site. In the ED, fever 10 43F, UA +..   Clinical Impression   Pt admitted with fever**. Pt currently with functional limitations due to the deficits listed below (see PT Problem List). Pt will benefit from skilled OT to increase their independence and safety with mobility to allow discharge to the venue listed below.  Per pt and his sister he was able to walk with a RW until April 2015 when he had an ankle fx. Since then he has been in a SNF or the hospital. He was primarily doing bed level exercises with OT at SNF due to having a wound VAC and leakage from colostomy with movement. He stated he hasn't been out of bed since Sept 6, 2015 when PT was DC'd due to problems with ostomy/VAC.        Follow Up Recommendations  SNF       Recommendations for Other Services PT consult     Precautions / Restrictions Precautions Precautions: Fall Restrictions Weight Bearing Restrictions: No      Mobility Bed Mobility Overal bed mobility: +2 for physical assistance Bed Mobility: Supine to Sit;Sit to Supine Rolling: +2 for physical assistance   Supine to sit: +2 for physical assistance;HOB elevated;Max assist Sit to supine: +2 for physical assistance;HOB elevated;Max assist   General bed mobility comments: pt 50%, assist required for raising  trunk and advancing BLEs  Transfers                 General transfer comment: pt scooted towards HOB in sitting with +2 assist, pt 25%, cues for technique, pt unable to clear hips from bed, used pad to assist    Balance Overall balance assessment: Needs assistance Sitting-balance support: Single extremity supported;Feet supported Sitting balance-Leahy Scale: Fair Sitting balance - Comments: pt sat on EOB x 10 minutes and performed weight shifting exercises R to L and anterior/posterior, as well as shoulder rolls, cues to lift head due to flexed posture                                    ADL Overall ADL's : Needs assistance/impaired Eating/Feeding: Set up   Grooming: Set up;Bed level                                 General ADL Comments: Pt overall mix A- total A with toileting, bathing and dressing. Pt needed max encouragement!  Pt did call sister during OT session and sister was very encouraging!                 Pertinent Vitals/Pain Pain Assessment: 0-10 Pain Score: 10-Worst pain ever Pain Location: L knee with atttempted bending Pain Descriptors / Indicators: Constant Pain Intervention(s): Patient requesting pain meds-RN notified     Hand Dominance  Extremity/Trunk Assessment Upper Extremity Assessment Upper Extremity Assessment: Generalized weakness;RUE deficits/detail RUE Deficits / Details: R shoulder flexion AROM 90*, limited by pain   Lower Extremity Assessment Lower Extremity Assessment: Generalized weakness;RLE deficits/detail;LLE deficits/detail (B feet with pitting edema, L moreso than R ) RLE Deficits / Details: sensation to light touch diminished B feet, knee extension -4/5, ankle AROM WNL, ankle PF/DF 4/5, hip ABDuction 3/5 RLE Sensation: history of peripheral neuropathy LLE Deficits / Details: sensation to light touch diminished B feet, knee extension +3/5, ankle PF/DF 4/5, hip ABDuction 3/5 LLE Sensation: history of  peripheral neuropathy   Cervical / Trunk Assessment Cervical / Trunk Assessment: Normal   Communication Communication Communication: No difficulties   Cognition Arousal/Alertness: Awake/alert Behavior During Therapy: Flat affect Overall Cognitive Status: No family/caregiver present to determine baseline cognitive functioning                                Home Living Family/patient expects to be discharged to:: Inpatient rehab Living Arrangements:  (has been in SNF since April due to ankle fx) Available Help at Discharge: Family (lived with sister prior to April)                         Home Equipment: Gilford Rile - 2 wheels   Additional Comments: Pt has been at [facility] since ankle fx in April      Prior Functioning/Environment Level of Independence: Needs assistance  Gait / Transfers Assistance Needed: Pt reports to OT that he has not ambulated since ankle fracture.  He states he has only been lifted to commode with a hoyer, and stopped therapy at SNF on 01/15/14 due to abdominal pain and ostomy leakage.  ADL's / Homemaking Assistance Needed: total A since L ankle fx in April.  Prior to April, he reports he was independent with a RW.         OT Diagnosis: Generalized weakness;Acute pain   OT Problem List: Pain;Decreased strength;Decreased activity tolerance   OT Treatment/Interventions: Self-care/ADL training;Therapeutic exercise;Patient/family education;DME and/or AE instruction;Energy conservation    OT Goals(Current goals can be found in the care plan section) Acute Rehab OT Goals Patient Stated Goal: be able to move back in with sister OT Goal Formulation: With patient Time For Goal Achievement: 02/15/14 Potential to Achieve Goals: Good  OT Frequency: Min 2X/week   Barriers to D/C:               End of Session Nurse Communication: Mobility status  Activity Tolerance: Patient limited by pain Patient left: in bed;with call bell/phone within reach    Time: 3846-6599 OT Time Calculation (min): 30 min Charges:  OT General Charges $OT Visit: 1 Procedure OT Evaluation $Initial OT Evaluation Tier I: 1 Procedure OT Treatments $Self Care/Home Management : 23-37 mins G-Codes:    Payton Mccallum D 2014-02-26, 1:35 PM

## 2014-02-01 NOTE — Progress Notes (Signed)
Patient was screened by Gerlean Ren for appropriateness for an Inpatient Acute Rehab consult.  At this time, we are recommending SNF for his post acute rehab.  Likely to need longer rehab than what he could receive on CIR.  Please call if questions.   [facility] Admissions Coordinator Cell [phone] Office [phone]

## 2014-02-01 NOTE — Evaluation (Signed)
Physical Therapy Evaluation Patient Details Name: [name] MRN: [id number] DOB: 11/07/1950 Today's Date: 02/01/2014   History of Present Illness   63 year-old male patient with history of perforated and ischemic transverse colon, s/p emergency laparotomy, transverse and right colectomy with end ileostomy and mucous fistula by Dr. 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 He also noticed some leak from the colostomy site. In the ED, fever 10 59F, UA +..  Clinical Impression  *Pt admitted with fever**. Pt currently with functional limitations due to the deficits listed below (see PT Problem List).  Pt will benefit from skilled PT to increase their independence and safety with mobility to allow discharge to the venue listed below.   Per pt and his sister he was able to walk with a RW until April 2015 when he had an ankle fx. Since then he has been in a SNF or the hospital. He was primarily doing bed level exercises with PT at SNF due to having a wound VAC and leakage from colostomy with movement.  He stated he hasn't been out of bed since Sept 6, 2015 when PT was DC'd due to problems with ostomy/VAC.    Today pt required max assist for supine to sit. He sat on EOB x 10 minutes where he performed weight shifting exercises. Sitting tolerance limited by fatigue and pain. BLE strengthening exercises were also performed. CIR consult recommended.  **    Follow Up Recommendations CIR    Equipment Recommendations   (TBD)    Recommendations for Other Services Rehab consult     Precautions / Restrictions Precautions Precautions: Fall Restrictions Weight Bearing Restrictions: No      Mobility  Bed Mobility Overal bed mobility: +2 for physical assistance Bed  Mobility: Supine to Sit;Sit to Supine Rolling: +2 for physical assistance   Supine to sit: +2 for physical assistance;HOB elevated;Max assist Sit to supine: +2 for physical assistance;HOB elevated;Max assist   General bed mobility comments: pt 50%, assist required for raising trunk and advancing BLEs  Transfers                 General transfer comment: pt scooted towards HOB in sitting with +2 assist, pt 25%, cues for technique, pt unable to clear hips from bed, used pad to assist  Ambulation/Gait                Stairs            Wheelchair Mobility    Modified Rankin (Stroke Patients Only)       Balance Overall balance assessment: Needs assistance Sitting-balance support: Single extremity supported;Feet supported Sitting balance-Leahy Scale: Fair Sitting balance - Comments: pt sat on EOB x 10 minutes and performed weight shifting exercises R to L and anterior/posterior, as well as shoulder rolls, cues to lift head due to flexed posture                                     Pertinent Vitals/Pain 8/10 L knee, rectum, R shoulder Premedicated, repositioned    Home Living Family/patient expects to be discharged to:: Inpatient rehab Living Arrangements:  (has been in SNF since April due to ankle fx) Available Help at Discharge: Family (  lived with sister prior to April)           Home Equipment: Gilford Rile - 2 wheels Additional Comments: Pt has been at [facility] since ankle fx in April    Prior Function Level of Independence: Needs assistance   Gait / Transfers Assistance Needed: Pt reports to OT that he has not ambulated since ankle fracture.  He states he has only been lifted to commode with a hoyer, and stopped therapy at SNF on 01/15/14 due to abdominal pain and ostomy leakage.   ADL's / Homemaking Assistance Needed: total A since L ankle fx in April.  Prior to April, he reports he was independent with a RW.         Hand Dominance         Extremity/Trunk Assessment   Upper Extremity Assessment: Generalized weakness;RUE deficits/detail RUE Deficits / Details: R shoulder flexion AROM 90*, limited by pain         Lower Extremity Assessment: Generalized weakness;RLE deficits/detail;LLE deficits/detail (B feet with pitting edema, L moreso than R ) RLE Deficits / Details: sensation to light touch diminished B feet, knee extension -4/5, ankle AROM WNL, ankle PF/DF 4/5, hip ABDuction 3/5 LLE Deficits / Details: sensation to light touch diminished B feet, knee extension +3/5, ankle PF/DF 4/5, hip ABDuction 3/5  Cervical / Trunk Assessment: Normal  Communication   Communication: No difficulties  Cognition Arousal/Alertness: Awake/alert Behavior During Therapy: Flat affect Overall Cognitive Status: No family/caregiver present to determine baseline cognitive functioning                      General Comments      Exercises General Exercises - Lower Extremity Ankle Circles/Pumps: AROM;Both;10 reps;Supine Short Arc Quad: AROM;Both;10 reps;Supine Long Arc Quad: AROM;Both;5 reps;Seated Heel Slides: AROM;Both;10 reps;Supine Hip ABduction/ADduction: AAROM;Both;10 reps;Supine      Assessment/Plan    PT Assessment Patient needs continued PT services  PT Diagnosis Generalized weakness;Acute pain;Difficulty walking   PT Problem List Decreased strength;Decreased activity tolerance;Decreased balance;Pain;Decreased mobility;Obesity  PT Treatment Interventions Gait training;Functional mobility training;Therapeutic activities;Therapeutic exercise;Balance training;Neuromuscular re-education   PT Goals (Current goals can be found in the Care Plan section) Acute Rehab PT Goals Patient Stated Goal: be able to move back in with sister PT Goal Formulation: With patient/family Time For Goal Achievement: 02/15/14 Potential to Achieve Goals: Good    Frequency Min 4X/week   Barriers to discharge        Co-evaluation                End of Session   Activity Tolerance: Patient limited by fatigue;Patient limited by pain Patient left: in bed;with call bell/phone within reach;with family/visitor present Nurse Communication: Mobility status         Time: 7829-5621 PT Time Calculation (min): 41 min   Charges:   PT Evaluation $Initial PT Evaluation Tier I: 1 Procedure PT Treatments $Therapeutic Exercise: 8-22 mins $Therapeutic Activity: 8-22 mins   PT G Codes:          Philomena Doheny 02/01/2014, 12:58 PM [phone]

## 2014-02-01 NOTE — Discharge Instructions (Signed)
Information on my medicine - Coumadin®   (Warfarin) ° °This medication education was reviewed with me or my healthcare representative as part of my discharge preparation.  The pharmacist that spoke with me during my hospital stay was:  Lawrance Wiedemann R, RPH ° °Why was Coumadin prescribed for you? °Coumadin was prescribed for you because you have a blood clot or a medical condition that can cause an increased risk of forming blood clots. Blood clots can cause serious health problems by blocking the flow of blood to the heart, lung, or brain. Coumadin can prevent harmful blood clots from forming. °As a reminder your indication for Coumadin is:   Select from menu ° °What test will check on my response to Coumadin? °While on Coumadin (warfarin) you will need to have an INR test regularly to ensure that your dose is keeping you in the desired range. The INR (international normalized ratio) number is calculated from the result of the laboratory test called prothrombin time (PT). ° °If an INR APPOINTMENT HAS NOT ALREADY BEEN MADE FOR YOU please schedule an appointment to have this lab work done by your health care provider within 7 days. °Your INR goal is usually a number between:  2 to 3 or your provider may give you a more narrow range like 2-2.5.  Ask your health care provider during an office visit what your goal INR is. ° °What  do you need to  know  About  COUMADIN? °Take Coumadin (warfarin) exactly as prescribed by your healthcare provider about the same time each day.  DO NOT stop taking without talking to the doctor who prescribed the medication.  Stopping without other blood clot prevention medication to take the place of Coumadin may increase your risk of developing a new clot or stroke.  Get refills before you run out. ° °What do you do if you miss a dose? °If you miss a dose, take it as soon as you remember on the same day then continue your regularly scheduled regimen the next day.  Do not take two doses of  Coumadin at the same time. ° °Important Safety Information °A possible side effect of Coumadin (Warfarin) is an increased risk of bleeding. You should call your healthcare provider right away if you experience any of the following: °  Bleeding from an injury or your nose that does not stop. °  Unusual colored urine (red or dark brown) or unusual colored stools (red or black). °  Unusual bruising for unknown reasons. °  A serious fall or if you hit your head (even if there is no bleeding). ° °Some foods or medicines interact with Coumadin® (warfarin) and might alter your response to warfarin. To help avoid this: °  Eat a balanced diet, maintaining a consistent amount of Vitamin K. °  Notify your provider about major diet changes you plan to make. °  Avoid alcohol or limit your intake to 1 drink for women and 2 drinks for men per day. °(1 drink is 5 oz. wine, 12 oz. beer, or 1.5 oz. liquor.) ° °Make sure that ANY health care provider who prescribes medication for you knows that you are taking Coumadin (warfarin).  Also make sure the healthcare provider who is monitoring your Coumadin knows when you have started a new medication including herbals and non-prescription products. ° °Coumadin® (Warfarin)  Major Drug Interactions  °Increased Warfarin Effect Decreased Warfarin Effect  °Alcohol (large quantities) °Antibiotics (esp. Septra/Bactrim, Flagyl, Cipro) °Amiodarone (Cordarone) °Aspirin (ASA) °Cimetidine (Tagamet) °  Megestrol (Megace) °NSAIDs (ibuprofen, naproxen, etc.) °Piroxicam (Feldene) °Propafenone (Rythmol SR) °Propranolol (Inderal) °Isoniazid (INH) °Posaconazole (Noxafil) Barbiturates (Phenobarbital) °Carbamazepine (Tegretol) °Chlordiazepoxide (Librium) °Cholestyramine (Questran) °Griseofulvin °Oral Contraceptives °Rifampin °Sucralfate (Carafate) °Vitamin K  ° °Coumadin® (Warfarin) Major Herbal Interactions  °Increased Warfarin Effect Decreased Warfarin Effect  °Garlic °Ginseng °Ginkgo biloba Coenzyme Q10 °Green  tea °St. John’s wort   ° °Coumadin® (Warfarin) FOOD Interactions  °Eat a consistent number of servings per week of foods HIGH in Vitamin K °(1 serving = ½ cup)  °Collards (cooked, or boiled & drained) °Kale (cooked, or boiled & drained) °Mustard greens (cooked, or boiled & drained) °Parsley *serving size only = ¼ cup °Spinach (cooked, or boiled & drained) °Swiss chard (cooked, or boiled & drained) °Turnip greens (cooked, or boiled & drained)  °Eat a consistent number of servings per week of foods MEDIUM-HIGH in Vitamin K °(1 serving = 1 cup)  °Asparagus (cooked, or boiled & drained) °Broccoli (cooked, boiled & drained, or raw & chopped) °Brussel sprouts (cooked, or boiled & drained) *serving size only = ½ cup °Lettuce, raw (green leaf, endive, romaine) °Spinach, raw °Turnip greens, raw & chopped  ° °These websites have more information on Coumadin (warfarin):  www.coumadin.com; °www.ahrq.gov/consumer/coumadin.htm; ° ° °

## 2014-02-01 NOTE — Progress Notes (Addendum)
ANTICOAGULATION CONSULT NOTE - Follow Up  Pharmacy Consult for Lovenox / Warfarin Indication: DVT  Allergies  Allergen Reactions  . Penicillins     Mother and brother have had severe allergic reactions, no personal reaction    Patient Measurements: Height: 6\' 5"  (195.6 cm) Weight: 267 lb 6.7 oz (121.3 kg) IBW/kg (Calculated) : 89.1  Vital Signs: Temp: 98 F (36.7 C) (09/23 0523) Temp src: Oral (09/23 0523) BP: 115/68 mmHg (09/23 0523) Pulse Rate: 81 (09/23 0523)  Labs:  Recent Labs  01/29/14 2327 01/30/14 0624 01/31/14 0406 01/31/14 1500 02/01/14 0440  HGB 14.9 12.0* 11.4*  --  12.8*  HCT 44.4 36.5* 34.8*  --  39.2  PLT 199 146* 150  --  163  LABPROT  --   --   --  15.2 15.9*  INR  --   --   --  1.20 1.27  CREATININE 0.69 0.61 0.58  --   --     Estimated Creatinine Clearance: 139.9 ml/min (by C-G formula based on Cr of 0.58).   Medications:  Scheduled:  . enoxaparin (LOVENOX) injection  120 mg Subcutaneous Q12H  . FLUoxetine  20 mg Oral q morning - 10a  . gabapentin  600 mg Oral TID  . levETIRAcetam  1,500 mg Oral TID  . liver oil-zinc oxide  1 application Topical BID  . phenytoin  100 mg Oral 4 times per day on Sun Tue Thu Sat  . phenytoin  100 mg Oral 3 times per day on Mon Wed Fri  . piperacillin-tazobactam (ZOSYN)  IV  3.375 g Intravenous Q8H  . simvastatin  10 mg Oral q1800  . tamsulosin  0.4 mg Oral Daily  . vitamin B-12  1,000 mcg Oral Daily  . warfarin   Does not apply Once  . Warfarin - Pharmacist Dosing Inpatient   Does not apply q1800   Infusions:    Assessment: 51 yoM admitted 9/20 from SNF with rule out sepsis, source: urine vs abdomen vs pneumonia.   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 Noted patient also on chronic phenytoin for history of seizures, this is known to be a potent inducer of CYP 3A4 which is an enzyme that metabolizes Xarelto. This thereby decreases systemic concentration of Xarelto.   Xarelto appears to be  started in July of this year as patient had been on warfarin since the early 2000s, but due to its interaction with phenytoin, has been difficult to keep INR within therapeutic range.   Per previous PharmD's discussion of these interactions with [facility] MD, Pharmacy was consulted 9/21 to dose enoxaparin for chronic DVT and warfarin was added 9/22. Xarelto has been discontinued  In April 2015 pt on Warfarin 5mg  daily except 2.5mg  Tue/Sat and in June 2015 pt on warfarin 6mg  daily  Drug interaction does exist between phenytoin and warfarin, but can be managed with appropriate INR monitoring: --Phenytoin can initially displace warfarin from protein binding and result in increased INR --With long-term concurrent therapy, phenytoin induces the metabolism of warfarin, therefore resulting in increased warfarin dosing needs.   Inpatient Warfarin doses: 9/22 - 7.5mg   Today, 9/23:  INR subtherapeutic as expected after first dose of warfarin on 9/22  CBC low but stable  SCr wnl on 9/22, CrCl > 100 ml/min  No bleeding reported  Goal of Therapy:  INR 2-3 Anti-Xa level 0.6-1 units/ml 4hrs after LMWH dose given   Plan:   Repeat Warfarin 7.5mg  po x 1 tonight  Check daily  PT/INR  Continue Lovenox 120mg  sq q12h to complete 5 days minimum overlap with warfarin AND therapeutic INR for 24hrs per CHEST Guidelines Provided warfarin education materials to patient. Discussed warfarin indication, adverse effects, importance of INR monitoring, need for phenytoin level monitoring, food interactions (he avoids high vitamin K-containing foods as his INR is sensitive to high intake). He is very fmiliar with these issues.  Peggyann Juba, PharmD, BCPS Pager: [phone] 02/01/2014,7:26 AM

## 2014-02-02 LAB — PROTIME-INR
INR: 1.53 — AB (ref 0.00–1.49)
Prothrombin Time: 18.4 seconds — ABNORMAL HIGH (ref 11.6–15.2)

## 2014-02-02 MED ORDER — HYDROCODONE-ACETAMINOPHEN 5-325 MG PO TABS: 1.0000 | ORAL_TABLET | Freq: Four times a day (QID) | ORAL | Status: DC | PRN

## 2014-02-02 MED ORDER — CEFUROXIME AXETIL 250 MG PO TABS: 250.0000 mg | ORAL_TABLET | Freq: Two times a day (BID) | ORAL | Status: AC

## 2014-02-02 MED ORDER — ALUM & MAG HYDROXIDE-SIMETH 200-200-20 MG/5ML PO SUSP: 30.0000 mL | Freq: Four times a day (QID) | ORAL | Status: DC | PRN

## 2014-02-02 MED ORDER — METRONIDAZOLE 500 MG PO TABS: 500.0000 mg | ORAL_TABLET | Freq: Three times a day (TID) | ORAL | Status: AC

## 2014-02-02 MED ORDER — WARFARIN SODIUM 7.5 MG PO TABS
7.5000 mg | ORAL_TABLET | Freq: Once | ORAL | Status: AC
  Administered 2014-02-02: 7.5 mg via ORAL
  Filled 2014-02-02: qty 1

## 2014-02-02 MED ORDER — ENOXAPARIN SODIUM 120 MG/0.8ML ~~LOC~~ SOLN: 120.0000 mg | Freq: Two times a day (BID) | SUBCUTANEOUS | Status: DC

## 2014-02-02 MED ORDER — ACETAMINOPHEN 500 MG PO TABS: 500.0000 mg | ORAL_TABLET | Freq: Two times a day (BID) | ORAL | Status: DC | PRN

## 2014-02-02 NOTE — Progress Notes (Signed)
ANTICOAGULATION CONSULT NOTE - Follow Up  Pharmacy Consult for Lovenox / Warfarin Indication: DVT  Allergies  Allergen Reactions  . Penicillins     Mother and brother have had severe allergic reactions, no personal reaction   Patient Measurements: Height: 6\' 5"  (195.6 cm) Weight: 259 lb 14.8 oz (117.9 kg) IBW/kg (Calculated) : 89.1  Vital Signs: Temp: 98 F (36.7 C) (09/24 0616) Temp src: Axillary (09/24 0616) BP: 116/68 mmHg (09/24 0616) Pulse Rate: 71 (09/24 0616)  Labs:  Recent Labs  01/31/14 0406 01/31/14 1500 02/01/14 0440 02/02/14 0434  HGB 11.4*  --  12.8*  --   HCT 34.8*  --  39.2  --   PLT 150  --  163  --   LABPROT  --  15.2 15.9* 18.4*  INR  --  1.20 1.27 1.53*  CREATININE 0.58  --   --   --    Estimated Creatinine Clearance: 138 ml/min (by C-G formula based on Cr of 0.58).  Medications:  Scheduled:  . cefUROXime  250 mg Oral BID WC  . enoxaparin (LOVENOX) injection  120 mg Subcutaneous Q12H  . FLUoxetine  20 mg Oral q morning - 10a  . gabapentin  600 mg Oral TID  . levETIRAcetam  1,500 mg Oral TID  . liver oil-zinc oxide  1 application Topical BID  . metroNIDAZOLE  500 mg Oral 3 times per day  . phenytoin  100 mg Oral 4 times per day on Sun Tue Thu Sat  . phenytoin  100 mg Oral 3 times per day on Mon Wed Fri  . simvastatin  10 mg Oral q1800  . tamsulosin  0.4 mg Oral Daily  . vitamin B-12  1,000 mcg Oral Daily  . Warfarin - Pharmacist Dosing Inpatient   Does not apply q1800   Assessment: [name] admitted 9/20 from SNF with rule out sepsis, source: urine vs abdomen vs pneumonia.   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 Noted patient also on chronic phenytoin for history of seizures, this is known to be a potent inducer of CYP 3A4 which is an enzyme that metabolizes Xarelto. This thereby decreases systemic concentration of Xarelto.   Xarelto appears to be started in July of this year as patient had been on warfarin since the early 2000s,  but due to its interaction with phenytoin, had been difficult to keep INR within therapeutic range.   Per previous PharmD's discussion of these interactions with [facility] MD, Pharmacy was consulted 9/21 to dose enoxaparin for chronic DVT and warfarin was added 9/22. Xarelto has been discontinued  In April 2015 pt on Warfarin 5mg  daily except 2.5mg  Tue/Sat and in June 2015 pt on warfarin 6mg  daily  Drug interaction does exist between phenytoin and warfarin, but can be managed with appropriate INR monitoring: --Phenytoin can initially displace warfarin from protein binding and result in increased INR --With long-term concurrent therapy, phenytoin induces the metabolism of warfarin, therefore resulting in increased warfarin dosing needs. Patient was managed appropriately on Warfarin up to March of 2015-was seen in MD office for INR checks and Warfarin dosing.  -Metronidazole added 9/23 with change of Zosyn to Ceftin for possible colitis. Metronidazole can increase INR values.  Inpatient Warfarin doses from 9/22: 7.5mg , 7.5mg   Today, 9/23:  INR 1.53, subtherapeutic but increasing  CBC low but stable  SCr wnl on 9/22, CrCl > 100 ml/min  No bleeding reported  Goal of Therapy:  INR 2-3 Anti-Xa level 0.6-1 units/ml 4hrs after  LMWH dose given   Plan:   For discharge to facility [facility]) today  Repeat Warfarin 7.5mg  po x 1 today before discharge  Check daily PT/INR at facility until INR therapeutic, and ensure that any effect from Flagyl has dissipated  Continue Lovenox 120mg  sq q12h until INR therapeutic Provided warfarin education materials to patient. Discussed warfarin indication, adverse effects, importance of INR monitoring, need for phenytoin level monitoring, food interactions (he avoids high vitamin K-containing foods as his INR is sensitive to high intake). He is very familiar with these issues.  Thank you,   Minda Ditto PharmD Pager [phone] 02/02/2014, 10:18  AM

## 2014-02-02 NOTE — Progress Notes (Signed)
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 He also noticed some leak from the colostomy site. In the ED, fever 10 53F, UA +..    PT Comments    Pt AxO x 4 and pleasant.  Assisted from supine to EOB x 2 assist.  Once upright pt was able to static sit EOB at Supervision level.  Using EVA walker and + 2 assist plus elevated bed pt was able to briefly stand.  Pt stated his knees tend to give out and he can't feel his feet due to his neuropathy.  Attempted steps however pt was unable to weight shift and unable to flex knees.  Pt stood approx 90 seconds then had to sit.  Unable to pivot feet used Hoyer Lift to assist from EOB to recliner.    Follow Up Recommendations  SNF     Equipment Recommendations       Recommendations for Other Services       Precautions / Restrictions Precautions Precautions: Fall Restrictions Weight Bearing Restrictions: No    Mobility  Bed Mobility Overal bed mobility: +2 for physical assistance Bed Mobility: Supine to Sit     Supine to sit: +2 for physical assistance;Total assist     General bed mobility comments: pt required 75% assist with upper body due to recent ABD EXP LAP   Transfers Overall transfer level: Needs assistance Equipment used: Bilateral platform walker (EVA walker) Transfers: Sit to/from Stand Sit to Stand: Independent         General transfer comment: off elevated  surface and using EVA walker pt was able to briefly rise to an upright position with much effort.  Pt c/o MAX B LE weakness stating "my knees tend to give way."  Pt static stood x 90 sec attempting marching however unable to weight shift enough and unable to unlock extended knees.  Stood pt twice.  Second stand was unsuccessful as pt was unable to achieve upright posture and unable to lock knees a second time due to weakness/fatigue.  Used Hoyer lift to assist pt from EOB to recliner.    Ambulation/Gait         Gait velocity: unable to attempt due to low transfer performance level       Stairs            Wheelchair Mobility    Modified Rankin (Stroke Patients Only)       Balance                                    Cognition                            Exercises      General Comments        Pertinent Vitals/Pain Pain Assessment: No/denies pain    Home Living  Prior Function            PT Goals (current goals can now be found in the care plan section) Progress towards PT goals: Progressing toward goals    Frequency  Min 4X/week    PT Plan      Co-evaluation             End of Session Equipment Utilized During Treatment: Gait belt Activity Tolerance: Patient limited by fatigue;Patient limited by pain Patient left: in chair;with call bell/phone within reach     Time: 7591-6384 PT Time Calculation (min): 28 min  Charges:  $Gait Training: 8-22 mins $Therapeutic Activity: 8-22 mins                    G Codes:      Rica Koyanagi  PTA [facility]  Acute  Rehab Pager      [phone]

## 2014-02-02 NOTE — Progress Notes (Signed)
Pt. Has been discharged to SNF.  Report was attempted to be called x2 with no answer from nurse.  Pt. Is to be transported to facility by ambulance services.

## 2014-02-02 NOTE — Consult Note (Signed)
WOC ostomy follow up Stoma type/location: RLQ ileostomy. Stomal assessment/size: 1 x 1 and 1 and 3.8 inch oval, slightly budded stoma in a crease.  Pink, moist. Peristomal assessment: intact, mild erythema Treatment options for stomal/peristomal skin: light dusting with pectin based powder with excess removed. Output soft brown stool Ostomy pouching: 1pc.pouching system with skin barrier ring  Education provided: No. Preparing for discharge to Rehab facility.  Wife present.  Both are grateful for pouch change prior to transfer. [facility] nursing team will not follow, but will remain available to this patient, the nursing and medical team.  Please re-consult if needed. Thanks, Maudie Flakes, MSN, RN, [place], [place], [place] [phone])

## 2014-02-02 NOTE — Progress Notes (Signed)
Pt for discharge to [facility].   CSW facilitated pt discharge needs including contacting facility, faxing pt discharge information via [facility], discussing with pt at bedside and pt sister via telephone, providing RN phone number to call report, and arranging ambulance transport for pt to [facility].   Pt coping appropriately with transition back to [facility]. CSW clarified pt questions regarding transition back to rehab at [facility].   No further social work needs identified at this time.  CSW signing off.   Alison Murray, MSW, [employer] [phone]

## 2014-02-02 NOTE — Discharge Summary (Signed)
Physician Discharge Summary  [name] HCW:[id number] DOB: Jul 26, 1950 DOA: 01/29/2014  PCP: Merrilee Seashore, MD  Admit date: 01/29/2014 Discharge date: 02/02/2014  Time spent: Greater than 30 minutes  Recommendations for Outpatient Follow-up:  1. MD at SNF in 4 days. 2. At SNF, Follow PT & INR daily. DC Lovenox once INR therapeutic >2. Adjust Coumadin dose as needed. Recommend CBC & BMP twice weekly-next 02/06/14. 3. Followup final blood culture results that were sent from the hospital. 4. Dr. Ralene Ok, Surgery in 2 weeks. 5. Dr. Merrilee Seashore, PCP upon discharge from SNF. 6. Management of ostomy as per WOC consult note.  Discharge Diagnoses:  Principal Problem:   Fever Active Problems:   History of DVT (deep vein thrombosis)   Seizure   UTI (lower urinary tract infection)   Hyperglycemia   Sepsis   Discharge Condition: Improved & Stable  Diet recommendation: Heart healthy diet.  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 He also noticed some leak from the colostomy site. In the ED, fever 10 40F, UA +.  Hospital Course:   1. Sepsis, present on admission: Source: Complicated UTI from Foley & ? Proctocolitis. Treated with IV fluids and initially started empirically on IV vancomycin, aztreonam and levofloxacin. As per discussion with pharmacy, has received Zosyn without allergy.  Transitioned to vancomycin and Zosyn. DC'd levofloxacin. CT abdomen without abscess. Blood cultures x2: Negative to date. Urine culture: Proteus mirabilis. C. difficile PCR negative. DC'ed vancomycin. Transitioned antibiotics to PO Ceftin to cover UTI and add Flagyl for? Proctocolitis. Complete total 7 days of both these antibiotics on 02/08/14. 2. Complicated Proteus Mirabilis UTI: Discontinued Foley. Change Zosyn to oral Ceftin. Management as above. 3. History of recurrent DVT: As discussed with pharmacy, patient on chronic phenytoin which is likely to decrease effectiveness of Xarelto and similarly Eliquis. Discussed with sister on 9/22 who stated that Coumadin actually worked fine until he was at a specific SNF in April where they had difficulty managing his Coumadin and was started on Xarelto. Started on full dose Lovenox and Coumadin. INR 1.5. Recommend checking daily PT/INR at facility until INR therapeutic and ensure that any effect from Flagyl has dissipated. Continue Lovenox full dose until INR therapeutic >2, then discontinue. 4. History of seizures: Continue phenytoin and Keppra. Patient states that he has been on phenytoin for greater than 20 years. Follows with [facility]. 5. 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 Surgery has signed off. Surgery had been consulted to evaluate for complaints of rectal pain. They discontinued wound VAC from midline wound and started wet-to-dry dressing changes twice a day. Rectal exam showed external nonthrombosed hemorrhoid and no rectal pain.   Consultations:  General surgery  WOC team  Procedures:  None    Discharge Exam:  Complaints: States that he continues to feel better. Denies rectal pain. Penile pain  is intermittent and has decreased by 80% since admission. Tolerating diet. No chest pain or dyspnea.  Filed  Vitals:   02/01/14 0523 02/01/14 1315 02/01/14 2233 02/02/14 0616  BP: 115/68 98/53 112/65 116/68  Pulse: 81 107 78 71  Temp: 98 F (36.7 C) 97.5 F (36.4 C) 98 F (36.7 C) 98 F (36.7 C)  TempSrc: Oral Oral Oral Axillary  Resp: 18 18 16 16   Height:      Weight: 121.3 kg (267 lb 6.7 oz)   117.9 kg (259 lb 14.8 oz)  SpO2: 98% 99% 98% 100%   General exam: Moderately built and obese middle-aged male lying comfortably supine in bed.  Respiratory system: clear to auscultation. No increased work of breathing.  Cardiovascular system: S1 & S2 heard, RRR. No JVD, murmurs, gallops, clicks or pedal edema.  Gastrointestinal system: Abdomen is nondistended, soft and nontender. Normal bowel sounds heard. Midline wound dressing clean and dry. LUQ mucous fistula intact. RLQ ileostomy intact with no stool leak.  Central nervous system: Alert and oriented. No focal neurological deficits.  Extremities: Symmetric 5 x 5 power.   Discharge Instructions      Discharge Instructions   Call MD for:  persistant nausea and vomiting    Complete by:  As directed      Call MD for:  redness, tenderness, or signs of infection (pain, swelling, redness, odor or green/yellow discharge around incision site)    Complete by:  As directed      Call MD for:  severe uncontrolled pain    Complete by:  As directed      Call MD for:  temperature >100.4    Complete by:  As directed      Diet - low sodium heart healthy    Complete by:  As directed      Discharge instructions    Complete by:  As directed   Ostomy management as per [facility] ostomy consult note.     Increase activity slowly    Complete by:  As directed             Medication List    STOP taking these medications       docusate sodium 100 MG capsule  Commonly known as:  COLACE     rivaroxaban 20 MG Tabs tablet  Commonly known as:  XARELTO     traMADol 50 MG tablet  Commonly known as:  ULTRAM      TAKE these medications       acetaminophen 500  MG tablet  Commonly known as:  TYLENOL  Take 1 tablet (500 mg total) by mouth 2 (two) times daily as needed for mild pain or fever.     alum & mag hydroxide-simeth 200-200-20 MG/5ML suspension  Commonly known as:  MAALOX/MYLANTA  Take 30 mLs by mouth every 6 (six) hours as needed for indigestion or heartburn.     Calcium-Vitamin D 600-400 MG-UNIT Tabs  Take 1 tablet by mouth daily. For calcium supplement     cefUROXime 250 MG tablet  Commonly known as:  CEFTIN  Take 1 tablet (250 mg total) by mouth 2 (two) times daily with a meal. Discontinue after 02/08/14 doses.     enoxaparin 120 MG/0.8ML injection  Commonly known as:  LOVENOX  Inject 0.8 mLs (120 mg total) into the skin every 12 (twelve) hours. Discontinue once INR is greater than 2.     FLUoxetine 20 MG capsule  Commonly known as:  PROZAC  Take 20  mg by mouth every morning. For depression     gabapentin 600 MG tablet  Commonly known as:  NEURONTIN  Take 600 mg by mouth 3 (three) times daily.     HYDROcodone-acetaminophen 5-325 MG per tablet  Commonly known as:  NORCO/VICODIN  Take 1-2 tablets by mouth every 6 (six) hours as needed for moderate pain or severe pain.     levETIRAcetam 750 MG tablet  Commonly known as:  KEPPRA  Take 1,500 mg by mouth 3 (three) times daily. For seizures     metroNIDAZOLE 500 MG tablet  Commonly known as:  FLAGYL  Take 1 tablet (500 mg total) by mouth every 8 (eight) hours. Discontinue after 02/08/14 doses.     multivitamin with minerals Tabs tablet  Take 1 tablet by mouth daily.     phenytoin 100 MG ER capsule  Commonly known as:  DILANTIN  Take 100 mg by mouth 3 (three) times daily. Give 1 capsule by mouth three times daily on Monday, Wednesday, and Friday.     phenytoin 100 MG ER capsule  Commonly known as:  DILANTIN  Take 100 mg by mouth 4 (four) times daily. To be taken four times a day on Sunday, Tuesday, Thursday, and Saturday     polyethylene glycol packet  Commonly known as:   MIRALAX / GLYCOLAX  Take 17 g by mouth daily as needed. For constipation     pravastatin 20 MG tablet  Commonly known as:  PRAVACHOL  Take 20 mg by mouth daily. For HLD     SECURA EXTRA PROTECTIVE 30.6 % Crea  Generic drug:  Zinc Oxide  Apply 1 application topically 2 (two) times daily.     tamsulosin 0.4 MG Caps capsule  Commonly known as:  FLOMAX  Take 0.4 mg by mouth daily. For BPH     vitamin B-12 1000 MCG tablet  Commonly known as:  CYANOCOBALAMIN  Take 1,000 mcg by mouth daily. For supplement       Follow-up Information   Follow up with MD at SNF. (Follow PT & INR daily. DC Lovenox once INR therapeutic >2. Adjust Coumadin dose as needed. Recommend CBC & BMP twice weekly-next 02/06/14.)       Follow up with Rosario Jacks., Anne Hahn, MD. Schedule an appointment as soon as possible for a visit in 2 weeks.   Specialty:  General Surgery   Contact information:   [address] [place] [address] [phone]        The results of significant diagnostics from this hospitalization (including imaging, microbiology, ancillary and laboratory) are listed below for reference.    Significant Diagnostic Studies: Dg Hip Complete Left  01/21/2014   CLINICAL DATA:  History of recent colon surgery, left hip pain  EXAM: LEFT HIP - COMPLETE 2+ VIEW  COMPARISON:  None.  FINDINGS: Pelvic bones are intact. Mild bilateral hip joint space narrowing. No evidence of femur fracture or dislocation.  IMPRESSION: No acute findings   Electronically Signed   By: Skipper Cliche M.D.   On: 01/21/2014 09:04   Ct Abdomen Pelvis W Contrast  01/30/2014   CLINICAL DATA:  Fever.  EXAM: CT ABDOMEN AND PELVIS WITH CONTRAST  TECHNIQUE: Multidetector CT imaging of the abdomen and pelvis was performed using the standard protocol following bolus administration of intravenous contrast.  CONTRAST:  169mL OMNIPAQUE IOHEXOL 300 MG/ML  SOLN  COMPARISON:  CT scan of January 21, 2014.  FINDINGS: Consolidation is  noted posteriorly in the right lower lobe  consistent with pneumonia or subsegmental atelectasis, with small associated pleural effusion. No definite osseous abnormality is noted.  No gallstones are noted. The low density seen in the right hepatic lobe on prior exam appears to have resolved. Stable cyst is noted in the spleen. Stable mild splenomegaly is noted. The pancreas appears normal. Adrenal glands appear normal. No hydronephrosis or renal obstruction is noted. No renal or ureteral calculi are noted. The patient is status post right hemicolectomy. Ileostomy is again noted in the right lower quadrant. 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 There also appears to be substantial wall thickening of the urinary bladder which may be due to lack of distension, but cystitis cannot be excluded. Foley catheter noted on prior exam has been removed. No abnormal fluid collection or definite abscess is noted. Mild bilateral fat containing inguinal hernias are noted. No significant adenopathy is noted. There is continued presence of left common and external iliac veins which are diminutive in caliber with associated calcifications suggesting sequela of chronic deep venous thrombosis.  IMPRESSION: Mild consolidation is noted posteriorly in the right lung base concerning for pneumonia or atelectasis.  Status post right hemicolectomy ileostomy seen in the right lower quadrant. 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 There is wall thickening of the mucous fistula which appears to be worse in the sigmoid colon and rectal region compared to prior exam, suggesting worsening proctocolitis.  No abnormal fluid collection or  definite abscess is noted.  Foley catheter has been removed. Significant wall thickening of the urinary bladder is noted with possible surrounding inflammation which may be due to lack of distension, but cystitis cannot be excluded. Clinical correlation is recommended.   Electronically Signed   By: Sabino Dick M.D.   On: 01/30/2014 10:14   Ct Abdomen Pelvis W Contrast  01/21/2014   CLINICAL DATA:  Chronic bilateral leg pain from venous stasis. Increasing leg pain over the past 2 days.  EXAM: CT ABDOMEN AND PELVIS WITH CONTRAST  TECHNIQUE: Multidetector CT imaging of the abdomen and pelvis was performed using the standard protocol following bolus administration of intravenous contrast.  CONTRAST:  85mL OMNIPAQUE IOHEXOL 300 MG/ML  SOLN  COMPARISON:  CT of the abdomen and pelvis 10/19/2013.  FINDINGS: Lung Bases: Areas of scarring and/or atelectasis in the lung bases bilaterally. Small hiatal hernia.  Abdomen/Pelvis: Compared to the prior study there has been interval right hemicolectomy. 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 There appears to be extensive rectal and colonic wall thickening throughout the mucous fistula, which could indicate a proctocolitis. Additionally, there is a relatively large volume of residual stool within the mucous fistula. A right lower quadrant diverting ileostomy is noted. No significant volume of ascites. No pneumoperitoneum. No pathologic distention of small bowel. No lymphadenopathy noted in the abdomen or pelvis. Foley balloon catheter within the lumen of the urinary bladder which is nearly completely decompressed. Small amount of gas in the nondependent portion of the urinary bladder is presumably iatrogenic.  There appears to be some mild periportal edema in the liver, most evident in the right lobe of the liver. No focal hepatic lesions are noted. 4 mm calcified gallstone in the neck of  the gallbladder. No current findings to suggest acute cholecystitis  at this time. The appearance of the pancreas, bilateral adrenal glands and the left kidney is unremarkable. Sub cm low-attenuation lesion in the lateral aspect of the upper pole of the right kidney is too small to characterize, but only slightly larger than remote prior study from 2009, presumably a small cyst. The spleen is enlarged measuring 15.0 x 8.4 x 11.2 cm (estimated splenic volume of 769 mL). There is again a well-defined low-attenuation lesion in the spleen measuring 6.5 cm, similar to the recent prior examination, presumably a cyst. Mild atherosclerosis throughout the abdominal and pelvic vasculature. In addition, in the left common femoral vein extending retrograde into the left external iliac vein there is some linear calcification, which could indicate chronic calcified thrombus. The left external iliac vein is diminutive in caliber in the areas of calcification, which could indicate chronic conclusion.  Musculoskeletal: There are no aggressive appearing lytic or blastic lesions noted in the visualized portions of the skeleton.  IMPRESSION: 1. 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 Throughout the mucous fistula there is diffuse rectal and colonic wall thickening, which may suggest a proctocolitis. There is also a relatively large amount of retained stool within the mucous fistula. 2. Mild periportal edema in the liver, most evident in the right lobe of the liver. This is nonspecific, but correlation with liver function tests is recommended. 3. Splenomegaly. 4. 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 Given the small size of the left external iliac vein, this may be chronically occluded. 5. Cholelithiasis without evidence to suggest acute cholecystitis at this time. 6. Additional incidental findings, as above.   Electronically Signed   By: Vinnie Langton  M.D.   On: 01/21/2014 12:56   Dg Chest Port 1 View  01/30/2014   CLINICAL DATA:  Penile pain.  Fever.  EXAM: PORTABLE CHEST - 1 VIEW  COMPARISON:  01/21/2014  FINDINGS: No cardiomegaly for technique. Stable upper mediastinal contours. Chronic Mild elevation of the right diaphragm.  Interstitial coarsening without edema or consolidation. No effusion or pneumothorax.  IMPRESSION: No active disease.   Electronically Signed   By: Jorje Guild M.D.   On: 01/30/2014 00:27   Dg Knee Complete 4 Views Left  01/21/2014   CLINICAL DATA:  Severe left knee pain  EXAM: LEFT KNEE - COMPLETE 4+ VIEW  COMPARISON:  None.  FINDINGS: Mild medial joint space narrowing with no significant osteophyte formation. No fracture or dislocation. No evidence of joint effusion.  IMPRESSION: No acute findings   Electronically Signed   By: Skipper Cliche M.D.   On: 01/21/2014 09:05   Dg Abd Acute W/chest  01/21/2014   CLINICAL DATA:  Most of colon was resected surgically 12/25/2013, patient has colostomy, severe left knee pain and hip pain  EXAM: ACUTE ABDOMEN SERIES (ABDOMEN 2 VIEW & CHEST 1 VIEW)  COMPARISON:  12/26/2013  FINDINGS: Mild cardiac enlargement exaggerated by limited inspiratory effect. Minimal atelectasis right lung base. Lungs otherwise clear. Pulmonary vascular pattern within normal limits.  No evidence of free air. Minimal gas in the abdomen with no abnormally dilated loops of bowel. There is a 5 cm left upper quadrant focal opacity of unknown significance or origin. This is not seen on prior studies.  IMPRESSION: No acute cardiopulmonary process.  Nonobstructive gas pattern.  Abnormal opacity of uncertain origin over the  left upper quadrant. Recommend CT abdomen and pelvis to further evaluate.   Electronically Signed   By: Skipper Cliche M.D.   On: 01/21/2014 08:57    Microbiology: Recent Results (from the past 240 hour(s))  URINE CULTURE     Status: None   Collection Time    01/25/14  8:26 AM      Result  Value Ref Range Status   Specimen Description URINE, CATHETERIZED   Final   Special Requests NONE   Final   Culture  Setup Time     Final   Value: 01/25/2014 11:56     Performed at [place]     Final   Value: 10,000 COLONIES/ML     Performed at [facility]   Culture     Final   Value: CITROBACTER KOSERI     Performed at [facility]   Report Status 01/27/2014 FINAL   Final   Organism ID, Bacteria CITROBACTER KOSERI   Final  CULTURE, BLOOD (ROUTINE X 2)     Status: None   Collection Time    01/29/14 11:27 PM      Result Value Ref Range Status   Specimen Description BLOOD RIGHT ANTECUBITAL   Final   Special Requests BOTTLES DRAWN AEROBIC AND ANAEROBIC 3CC   Final   Culture  Setup Time     Final   Value: 01/30/2014 09:41     Performed at [facility]   Culture     Final   Value:        BLOOD CULTURE RECEIVED NO GROWTH TO DATE CULTURE WILL BE HELD FOR 5 DAYS BEFORE ISSUING A FINAL NEGATIVE REPORT     Performed at [facility]   Report Status PENDING   Incomplete  CULTURE, BLOOD (ROUTINE X 2)     Status: None   Collection Time    01/29/14 11:27 PM      Result Value Ref Range Status   Specimen Description BLOOD LEFT ANTECUBITAL   Final   Special Requests BOTTLES DRAWN AEROBIC AND ANAEROBIC 5CC   Final   Culture  Setup Time     Final   Value: 01/30/2014 09:41     Performed at [facility]   Culture     Final   Value:        BLOOD CULTURE RECEIVED NO GROWTH TO DATE CULTURE WILL BE HELD FOR 5 DAYS BEFORE ISSUING A FINAL NEGATIVE REPORT     Performed at [facility]   Report Status PENDING   Incomplete  URINE CULTURE     Status: None   Collection Time    01/30/14 12:27 AM      Result Value Ref Range Status   Specimen Description URINE, CLEAN CATCH   Final   Special Requests Normal   Final   Culture  Setup Time     Final   Value: 01/30/2014 09:55     Performed at [facility]      Final   Value: >=100,000 COLONIES/ML     Performed at [facility]   Culture     Final   Value: PROTEUS MIRABILIS     Performed at [facility]   Report Status 02/01/2014 FINAL   Final   Organism ID, Bacteria PROTEUS MIRABILIS   Final  MRSA PCR SCREENING     Status: None   Collection Time    01/30/14  6:44 AM      Result Value Ref Range Status   MRSA by PCR NEGATIVE  NEGATIVE Final   Comment:            The GeneXpert MRSA Assay (FDA     approved for NASAL specimens     only), is one component of a     comprehensive MRSA colonization     surveillance program. It is not     intended to diagnose MRSA     infection nor to guide or     monitor treatment for     MRSA infections.  CLOSTRIDIUM DIFFICILE BY PCR     Status: None   Collection Time    01/30/14 12:31 PM      Result Value Ref Range Status   C difficile by pcr NEGATIVE  NEGATIVE Final   Comment: Performed at [place]: Basic Metabolic Panel:  Recent Labs Lab 01/29/14 2327 01/30/14 0624 01/31/14 0406  NA 133* 133* 137  K 4.7 4.0 4.0  CL 96 103 107  CO2 23 20 20   GLUCOSE 150* 104* 82  BUN 11 10 9   CREATININE 0.69 0.61 0.58  CALCIUM 8.9 7.8* 8.4   Liver Function Tests:  Recent Labs Lab 01/29/14 2327 01/30/14 0624  AST 44* 33  ALT 45 44  ALKPHOS 231* 183*  BILITOT 0.3 0.5  PROT 7.5 5.6*  ALBUMIN 2.7* 2.0*   No results found for this basename: LIPASE, AMYLASE,  in the last 168 hours No results found for this basename: AMMONIA,  in the last 168 hours CBC:  Recent Labs Lab 01/29/14 2327 01/30/14 0624 01/31/14 0406 02/01/14 0440  WBC 14.8* 16.0* 9.1 6.1  NEUTROABS 13.4* 13.6*  --   --   HGB 14.9 12.0* 11.4* 12.8*  HCT 44.4 36.5* 34.8* 39.2  MCV 91.7 93.1 93.0 93.8  PLT 199 146* 150 163   Cardiac Enzymes: No results found for this basename: CKTOTAL, CKMB, CKMBINDEX, TROPONINI,  in the last 168 hours BNP: BNP (last 3 results)  Recent Labs  10/19/13 1630   PROBNP 79.0   CBG: No results found for this basename: GLUCAP,  in the last 168 hours    Signed:  Vernell Leep, MD, FACP, FHM. [facility] Pager [phone]  If 7PM-7AM, please contact night-coverage [email or web address] Password [facility] 02/02/2014, 12:43 PM

## 2014-02-05 LAB — CULTURE, BLOOD (ROUTINE X 2)
CULTURE: NO GROWTH
Culture: NO GROWTH

## 2014-02-06 ENCOUNTER — Encounter (HOSPITAL_BASED_OUTPATIENT_CLINIC_OR_DEPARTMENT_OTHER): Payer: No Typology Code available for payment source | Attending: Plastic Surgery

## 2014-02-06 DIAGNOSIS — F3289 Other specified depressive episodes: Secondary | ICD-10-CM | POA: Insufficient documentation

## 2014-02-06 DIAGNOSIS — Z87891 Personal history of nicotine dependence: Secondary | ICD-10-CM | POA: Insufficient documentation

## 2014-02-06 DIAGNOSIS — Z86718 Personal history of other venous thrombosis and embolism: Secondary | ICD-10-CM | POA: Diagnosis not present

## 2014-02-06 DIAGNOSIS — I872 Venous insufficiency (chronic) (peripheral): Secondary | ICD-10-CM | POA: Insufficient documentation

## 2014-02-06 DIAGNOSIS — Z9049 Acquired absence of other specified parts of digestive tract: Secondary | ICD-10-CM | POA: Diagnosis not present

## 2014-02-06 DIAGNOSIS — I739 Peripheral vascular disease, unspecified: Secondary | ICD-10-CM | POA: Insufficient documentation

## 2014-02-06 DIAGNOSIS — T8189XA Other complications of procedures, not elsewhere classified, initial encounter: Secondary | ICD-10-CM | POA: Insufficient documentation

## 2014-02-06 DIAGNOSIS — Y838 Other surgical procedures as the cause of abnormal reaction of the patient, or of later complication, without mention of misadventure at the time of the procedure: Secondary | ICD-10-CM | POA: Diagnosis not present

## 2014-02-06 DIAGNOSIS — F329 Major depressive disorder, single episode, unspecified: Secondary | ICD-10-CM | POA: Diagnosis not present

## 2014-02-06 DIAGNOSIS — Z7901 Long term (current) use of anticoagulants: Secondary | ICD-10-CM | POA: Diagnosis not present

## 2014-02-06 DIAGNOSIS — Z932 Ileostomy status: Secondary | ICD-10-CM | POA: Diagnosis not present

## 2014-02-06 DIAGNOSIS — L259 Unspecified contact dermatitis, unspecified cause: Secondary | ICD-10-CM | POA: Insufficient documentation

## 2014-02-07 NOTE — Consult Note (Signed)
NAME:  [name], [name]                ACCOUNT NO.:  [id number]  MEDICAL RECORD NO.:  [id number]  LOCATION:  FOOT                         FACILITY:  [facility]  PHYSICIAN:  Irene Limbo, MD   DATE OF BIRTH:  1950/07/10  DATE OF CONSULTATION: DATE OF DISCHARGE:                                CONSULTATION   CHIEF COMPLAINT:  Open wound to abdomen, post laparotomy.  HISTORY OF PRESENT ILLNESS:  The patient is a 63 year old, currently nonambulatory male that is living in skilled nursing facility who presents for evaluation of open wound to abdomen.  Patient's history is significant for bimalleolar ankle fracture.  The patient was placed into a nursing facility following this. During that time, he developed abdominal pain, and upon workup was found to have ischemic and ruptured bowel.  He underwent emergent laparotomy with resection of portion of his colon and end ileostomy with mucous fistula.  The midline laparotomy wound was closed at the fascia and the skin was left open.  He had been treated with a wound VAC, however, this was discontinued during his last hospitalization for sepsis.  He was recently discharged with Proteus UTI.  He has also been placed back on to Coumadin for history of DVT. Prior to that, he was on Xarelto, but there was some concern that his other medications were interacting with this.  He does take both Ensure and ProShot in his current nursing facility.  The current wound care over his abdomen has been Hydrogel and dry dressings.  PAST MEDICAL HISTORY:  Includes coronary artery disease, depression, peripheral vascular disease, seizures, DVT of the right lower extremity, psoriasis, peripheral neuropathy, hyperlipidemia, venous stasis.  PAST SURGICAL HISTORY:  As per HPI as well as tonsillectomy.  SOCIAL HISTORY:  The patient is a former smoker and quit in 1983.  Review of laboratories including hemoglobin 15.2, dated January 21, 2014, and albumin of 2.8 dated  January 21, 2014.  There is no pre- albumin for review.  MEDICATIONS:  Include Keppra, Neurontin, Prozac, Colace, calcium, Dilantin, Vicodin, tramadol, Flomax, Pravachol, and Coumadin.  He had transitioned off of Lovenox bridge.  PHYSICAL EXAMINATION:  On examination, blood pressure is 118/67, pulse is 60, temperature is 97.7, height is 6 feet 5 inches, weight is 267 pounds.  Open wound of the abdomen is measured 14.4 x 4.4 x 0.1 cm.  The wound is completely granulated with minimal hypergranulation tissue present. There is no slough present.  No debridement was performed.  He does have in the right lower quadrant an ileostomy. Itappears that the ileostomy bag is placed with so that stool is allowed to sit over his adjacent skin.  He has peristomal dermatitis present. There is a mucous fistula over the left lower quadrant covered with ostomy bag.  ASSESSMENT:  Prealbumin was ordered today.  We will plan to institute collagen 3 times weekly and follow up in 2 week's time.  We reviewed appropriate stoma care.  The sister and patient report that this is largely dependent upon who in the nursing facility is placing his dressings. We included directions for this in our wound care.  We will plan for followup in 2 weeks time.  ______________________________ Irene Limbo, MD MBA     BT/MEDQ  D:  02/06/2014  T:  02/07/2014  Job:  [id number]

## 2014-02-20 ENCOUNTER — Encounter (HOSPITAL_BASED_OUTPATIENT_CLINIC_OR_DEPARTMENT_OTHER): Payer: Medicare Other | Attending: Plastic Surgery

## 2014-02-20 DIAGNOSIS — L98499 Non-pressure chronic ulcer of skin of other sites with unspecified severity: Secondary | ICD-10-CM | POA: Diagnosis not present

## 2014-02-20 DIAGNOSIS — Y839 Surgical procedure, unspecified as the cause of abnormal reaction of the patient, or of later complication, without mention of misadventure at the time of the procedure: Secondary | ICD-10-CM | POA: Insufficient documentation

## 2014-02-20 DIAGNOSIS — T8189XD Other complications of procedures, not elsewhere classified, subsequent encounter: Secondary | ICD-10-CM | POA: Diagnosis not present

## 2014-03-06 DIAGNOSIS — T8189XD Other complications of procedures, not elsewhere classified, subsequent encounter: Secondary | ICD-10-CM | POA: Diagnosis not present

## 2014-03-06 DIAGNOSIS — L98499 Non-pressure chronic ulcer of skin of other sites with unspecified severity: Secondary | ICD-10-CM | POA: Diagnosis not present

## 2014-03-20 ENCOUNTER — Encounter (HOSPITAL_BASED_OUTPATIENT_CLINIC_OR_DEPARTMENT_OTHER): Payer: Medicare Other | Attending: Plastic Surgery

## 2014-03-20 DIAGNOSIS — L98499 Non-pressure chronic ulcer of skin of other sites with unspecified severity: Secondary | ICD-10-CM | POA: Insufficient documentation

## 2014-03-30 ENCOUNTER — Ambulatory Visit: Payer: Self-pay | Admitting: Neurology

## 2014-03-31 ENCOUNTER — Ambulatory Visit (INDEPENDENT_AMBULATORY_CARE_PROVIDER_SITE_OTHER): Payer: Medicare Other | Admitting: Neurology

## 2014-03-31 ENCOUNTER — Encounter: Payer: Self-pay | Admitting: Neurology

## 2014-03-31 VITALS — BP 115/78 | HR 71 | Resp 14 | Ht 77.0 in | Wt 280.0 lb

## 2014-03-31 DIAGNOSIS — G609 Hereditary and idiopathic neuropathy, unspecified: Secondary | ICD-10-CM

## 2014-03-31 MED ORDER — PHENYTOIN SODIUM EXTENDED 100 MG PO CAPS: 100.0000 mg | ORAL_CAPSULE | Freq: Four times a day (QID) | ORAL | Status: DC

## 2014-03-31 MED ORDER — LEVETIRACETAM 750 MG PO TABS: 1500.0000 mg | ORAL_TABLET | Freq: Three times a day (TID) | ORAL | Status: DC

## 2014-03-31 MED ORDER — GABAPENTIN (ONCE-DAILY) 600 MG PO TABS: ORAL_TABLET | ORAL | Status: DC

## 2014-03-31 MED ORDER — GABAPENTIN 600 MG PO TABS: 600.0000 mg | ORAL_TABLET | Freq: Three times a day (TID) | ORAL | Status: DC

## 2014-03-31 MED ORDER — PHENYTOIN SODIUM EXTENDED 100 MG PO CAPS: 100.0000 mg | ORAL_CAPSULE | Freq: Three times a day (TID) | ORAL | Status: DC

## 2014-03-31 NOTE — Progress Notes (Signed)
Provider:  [name], M D  Referring Provider: Merrilee Seashore, MD Primary Care Physician:  Merrilee Seashore, MD  Chief Complaint  Patient presents with  . RV (former [facility] pt) Seizures/PN    Rm 61, [name], sister    HPI:  [name] is a 63 y.o. male , presenting on a stretcher ,  seen here as a referral  from Dr. Ashby Dawes for  new evaluation of  Epilepsy,  Neuropathy .   This patient has followed with Dr Erling Cruz prior to his retirement and was last seen 02-11-2011.  Dr. love described 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 His head would turn to the left repeatedly , he did not lose awareness.  The patient was placed on Dilantin brand name under milligrams 3 times a day alternating with 100 mg 4 times a day every other day. He was also on Keppra in the generic form of levetiracetam 1500 mg 3 times a day. He had been tried on topiramate but it caused him to have a stomach upset. Since the patient has severe neuropathy and chronic venostasis he has also been unsteady on his feet and a high fall risk was assessed.  He has psoriasis and easily bruises he fell in his home and broke his left ankle in a fall in February 2012 . he now fell , stumbling over his walker and broke his right ankle April 2015,  he spent a month in rehabilitation, developed a megacolon, he has a urinary catheter - Foley infected -he had to be readmitted with urosepsis, he has a colostomy bag which also has let to skin lacerations while in a facility  not adequately cared for.  His sister seems to be the one to change and since she  Provides  skin care he has no repeated infections, the folwy has been replaced.  The last lab level was obtained through Dr. Erling Cruz on 11-19-2010 with a normal CBC complex metabolic panel and a phenytoin level of 6. He also ordered a DEXA scan last 2008 and that was normal. A normal  bone density test from March 2013 was normal -according to Dr. Mathis Fare note . He has progressive numbness in his legs and feet but weaning him off Dilantin was not successful and increased his seizure frequency. It is likely that his neuropathy is partially caused by Dilantin.   Review of Systems: Out of a complete 14 system review, the patient complains of only the following symptoms, and all other reviewed systems are negative.  severe numbness to all modalities, poor circulation, no clots, no DVT now, on anticoagulation.  Resides in [name].   History   Social History  . Marital Status: Single    Spouse Name: N/A    Number of Children: N/A  . Years of Education: HS   Occupational History  . Not on file.   Social History Main Topics  . Smoking status: Former Smoker    Types: Pipe    Quit date: 08/10/2013  . Smokeless tobacco: Not on file  . Alcohol Use: No  . Drug Use: No  . Sexual Activity: Not on file   Other Topics Concern  . Not on file   Social History Narrative   Patient is single and lives with his sister.   Patient is retired from [employer].   Patient has a high school education.   Patient drinks one  cup of caffeine daily.   Patient is right-handed.          Family History  Problem Relation Age of Onset  . Vision loss Father   . Hypertension Father   . Diabetes Father   . Heart disease Father   . Cancer Father     colon  . Diabetes Mother   . Hypertension Mother   . Heart disease Mother   . Dementia Mother   . Cancer Mother     breast ca  . Diabetes Sister   . Hyperlipidemia Sister   . Hypertension Sister   . Diabetes Brother   . Kidney disease Brother   . Heart disease Brother     Past Medical History  Diagnosis Date  . Coronary artery disease   . Depression   . Peripheral vascular disease   . Seizures   . Deep vein thrombophlebitis of left leg   . Deep vein thrombosis of right lower extremity   . Psoriasis   . Arthritis   .  DVT (deep venous thrombosis)     multiple times  . Weakness of both legs   . Peripheral neuropathy   . Venous stasis   . Hyperplasia of prostate   . BPH (benign prostatic hyperplasia)   . Hyperlipidemia     Past Surgical History  Procedure Laterality Date  . Colonoscopy  03/26/2012    Procedure: COLONOSCOPY;  Surgeon: Beryle Beams, MD;  Location: [facility];  Service: Endoscopy;  Laterality: N/A;  . Tonsillectomy      as child  . Flexible sigmoidoscopy N/A 12/09/2013    Procedure: FLEXIBLE SIGMOIDOSCOPY;  Surgeon: Beryle Beams, MD;  Location: [facility];  Service: Endoscopy;  Laterality: N/A;  . Laparotomy N/A 12/25/2013    Procedure: EXPLORATORY LAPAROTOMY/Transverse and right coloectomy/ end  ileostomy and mucus  fistula;  Surgeon: Ralene Ok, MD;  Location: [facility];  Service: General;  Laterality: N/A;  . Ankle fracture surgery  09/04/13  . Colostomy  12-25-13    Ruptured colon    Current Outpatient Prescriptions  Medication Sig Dispense Refill  . gabapentin (NEURONTIN) 600 MG tablet Take 600 mg by mouth 3 (three) times daily.    Marland Kitchen HYDROcodone-acetaminophen (NORCO/VICODIN) 5-325 MG per tablet Take 1-2 tablets by mouth every 6 (six) hours as needed for moderate pain or severe pain. 20 tablet 0  . levETIRAcetam (KEPPRA) 750 MG tablet Take 1,500 mg by mouth 3 (three) times daily. For seizures    . phenytoin (DILANTIN) 100 MG ER capsule Take 100 mg by mouth 3 (three) times daily. Give 1 capsule by mouth three times daily on Monday, Wednesday, and Friday.    . phenytoin (DILANTIN) 100 MG ER capsule Take 100 mg by mouth 4 (four) times daily. To be taken four times a day on Sunday, Tuesday, Thursday, and Saturday    . pravastatin (PRAVACHOL) 20 MG tablet Take 20 mg by mouth daily. For HLD    . tamsulosin (FLOMAX) 0.4 MG CAPS capsule Take 0.4 mg by mouth daily. For BPH    . UNABLE TO FIND Med Name: [name] Powder.  For use around colostomy    . warfarin (COUMADIN) 1 MG  tablet Take 2 mg by mouth daily.   0  . warfarin (COUMADIN) 5 MG tablet Take 5 mg by mouth daily.  0  . acetaminophen (TYLENOL) 500 MG tablet Take 1 tablet (500 mg total) by mouth 2 (two) times daily as needed for mild pain or  fever. 30 tablet 0  . Calcium Carb-Cholecalciferol (CALCIUM-VITAMIN D) 600-400 MG-UNIT TABS Take 1 tablet by mouth daily. For calcium supplement    . enoxaparin (LOVENOX) 120 MG/0.8ML injection Inject 0.8 mLs (120 mg total) into the skin every 12 (twelve) hours. Discontinue once INR is greater than 2. 6 Syringe 0  . Multiple Vitamin (MULTIVITAMIN WITH MINERALS) TABS Take 1 tablet by mouth daily.    . vitamin B-12 (CYANOCOBALAMIN) 1000 MCG tablet Take 1,000 mcg by mouth daily. For supplement     No current facility-administered medications for this visit.    Allergies as of 03/31/2014 - Review Complete 03/31/2014  Allergen Reaction Noted  . Penicillins  03/26/2012    Vitals: BP 115/78 mmHg  Pulse 71  Resp 14  Ht 6\' 5"  (1.956 m)  Wt 280 lb (127.007 kg)  BMI 33.20 kg/m2 Last Weight:  Wt Readings from Last 1 Encounters:  03/31/14 280 lb (127.007 kg)   Last Height:   Ht Readings from Last 1 Encounters:  03/31/14 6\' 5"  (1.956 m)    Physical exam:  General: The patient is awake, alert and appears not in acute distress. The patient is well groomed. Head: Normocephalic, atraumatic. Neck is supple. He is examined in a supine position.  Mallampati 3, neck circumference: 16 Cardiovascular:  Regular rate and rhythm , without  murmurs or carotid bruit, and without distended neck veins. Respiratory: Lungs are clear to auscultation. Skin:  Without evidence of edema, or rash Trunk: BMI is elevated and patient  has normal posture.  Neurologic exam : The patient is awake and alert, oriented to place and time.  Memory subjectivedescribed as intact. There is a normal attention span & concentration ability. Speech is fluent without dysarthria, dysphonia or aphasia. Mood and  affect are appropriate.  Cranial nerves: Pupils are equal and briskly reactive to light.  Extraocular movements  in vertical and horizontal planes intact and without nystagmus.  Visual fields by finger perimetry are intact. Hearing to finger rub intact.  Facial sensation intact to fine touch.  Facial motor strength is symmetric and tongue and uvula move midline. Tongue protrusion into either cheek is normal. Shoulder shrug is normal.   Motor exam:   Weakness of dorsiflexion and toe movements, grip is preserved. No cogwheeling.   Sensory:  Fine touch, pinprick and vibration were tested and are absent in knee and below.  Coordination: Rapid alternating movements in the fingers/hands were normal.  Finger-to-nose maneuver  With  evidence of ataxia, dysmetria or tremor.  Gait and station: Patient on stretcher.  Deep tendon reflexes: in the upper and lower extremities are absent, as is babinski .    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  Profound neuropathy, hearing loss, loss of muscle tone in both lower extremities. Lost all hair on the lower extremities and has shiny skin.        Plan:  Treatment plan and additional workup : Epilepsy treatment on 3 medications. Neuropathy treatment on one of the epilepsy medications.  EMG and NCV , is this axonal and demyelinating neuropathy?  painful neuropathy,  Is this dilantin induced- if yes, why is there no nystagmus but dysmetria, ataxia. ?  Deconditioning after bed rest. Needs to start to PT, OT , and continue for the next 10 month. Change to Gralise or Horizon for longer acting  pain control.       Asencion Partridge Kaleeyah Cuffie MD 03/31/2014

## 2014-03-31 NOTE — Patient Instructions (Signed)

## 2014-04-03 DIAGNOSIS — L98499 Non-pressure chronic ulcer of skin of other sites with unspecified severity: Secondary | ICD-10-CM | POA: Diagnosis present

## 2014-04-03 NOTE — Progress Notes (Signed)
[facility]  NAME:  [name], [name]                ACCOUNT NO.:  [id number]  MEDICAL RECORD NO.:  [id number]      DATE OF BIRTH:  1951-03-29  PHYSICIAN:  Theodoro Kos, DO       VISIT DATE:  04/03/2014                                  OFFICE VISIT   HISTORY OF PRESENT ILLNESS:  The patient is a 63 year old who is here for an evaluation of his abdominal ulcer.  Unfortunately, he had an ankle fracture which led to surgery and rehab, then he had a problem with his bowel and ended up with surgery and a resection.  He is at home now, but they were not able to close his abdomen at that time, which was back in July, and he has been closing secondarily since.  He uses alginate on it at home.  His wife seems to be very attentive and does the dressing changes.  PAST MEDICAL HISTORY:  Positive for: 1. Coronary artery disease. 2. Depression. 3. Peripheral vascular disease. 4. Seizures. 5. Deep venous thrombosis. 6. Benign prostatic hypertrophy. 7. Ankle fracture. 8. Peripheral neuropathy. 9. Venous insufficiency. 10.Hypercholesterolemia.  PAST SURGICAL HISTORY:  Right colectomy, tonsillectomy, and ankle fracture.  ALLERGIES:  PENICILLIN.  MEDICATIONS:  Tylenol, Mylanta, calcium, vitamin B, Colace, Prozac, Neurontin, Norco, Keppra, Ensure, Dilantin, multivitamins, Xarelto, Flomax, Ultram, zinc, Pravachol, MiraLax.  SOCIAL HISTORY:  He lives at home.  He is not a smoker.  REVIEW OF SYSTEMS:  As stated above.  He has colostomy and his wife takes care of that.  PHYSICAL EXAMINATION:  GENERAL:  He is alert, oriented, cooperative, not in any distress, he seems to be well aware of his medical situation.  He is very pleasant. HEENT:  His pupils are equal. RESPIRATIONS:  His breathing is unlabored. CARDIOVASCULAR:  His heart rate is regular.  The abdominal wound sizes are noted in the chart and nurse's notes were reviewed as well.  The wound seems to be granulating  well, does not appear to be infected.  It is a few millimeters in depth that does not appear to be exposed nor is the intestine.  At this point, he needs to epithelialize. Therefore, he needs to keep it clean, wash like usual at least every other day with Dial soap, Endoform was placed, and then he can put collagen on it at home, and I will see him back in 2 weeks.     Theodoro Kos, DO     CS/MEDQ  D:  04/03/2014  T:  04/03/2014  Job:  [id number]

## 2014-04-17 ENCOUNTER — Ambulatory Visit (INDEPENDENT_AMBULATORY_CARE_PROVIDER_SITE_OTHER): Payer: Self-pay | Admitting: Neurology

## 2014-04-17 ENCOUNTER — Ambulatory Visit (INDEPENDENT_AMBULATORY_CARE_PROVIDER_SITE_OTHER): Payer: Medicare Other | Admitting: Neurology

## 2014-04-17 DIAGNOSIS — G729 Myopathy, unspecified: Secondary | ICD-10-CM

## 2014-04-17 DIAGNOSIS — G629 Polyneuropathy, unspecified: Secondary | ICD-10-CM

## 2014-04-17 DIAGNOSIS — G609 Hereditary and idiopathic neuropathy, unspecified: Secondary | ICD-10-CM

## 2014-04-17 DIAGNOSIS — G5602 Carpal tunnel syndrome, left upper limb: Secondary | ICD-10-CM

## 2014-04-17 NOTE — Procedures (Signed)
     HISTORY: [name] is a 63 year old gentleman with a history of burning discomfort in the feet going up the legs. The patient indicates that he has had symptoms since 2012. He is had difficulty with walking since rupture of the colon with peritonitis approximately 8 months ago. The patient is being evaluated for a peripheral neuropathy.  NERVE CONDUCTION STUDIES:  Nerve conduction studies were performed on the left upper extremity. The distal motor latency for the left median nerve was prolonged, with a normal motor amplitude. The distal motor latency and motor amplitudes for the left ulnar nerve were normal. The F wave latency for the left median nerve was borderline normal, normal for the left ulnar nerve, with normal nerve conduction velocities seen for these nerves. The left median sensory latency was prolonged, normal for the left ulnar nerve.  Nerve conduction studies were performed on both lower extremities. There was no response for the left peroneal nerve, with a normal distal motor latency for the right peroneal nerve with a low motor amplitude. The distal motor latencies for the posterior tibial nerves were normal bilaterally, with low motor amplitudes for these nerves bilaterally. Slight slowing was seen for the posterior tibial nerves bilaterally, borderline normal nerve conduction velocities seen for the right peroneal nerve. The peroneal sensory latencies were absent bilaterally, and the H reflex latencies were absent bilaterally.  EMG STUDIES:  EMG study was performed on the left lower extremity:  The tibialis anterior muscle reveals 2 to 3K motor units with decreased recruitment. No fibrillations or positive waves were seen. The peroneus tertius muscle reveals no voluntary motor units with no recruitment. No fibrillations or positive waves were seen. The medial gastrocnemius muscle reveals 1 to 3K motor units with decreased recruitment. 1+ fibrillations and positive waves  were seen. The vastus lateralis muscle reveals 1 to 2K motor units with full recruitment. No fibrillations or positive waves were seen. The biceps femoris muscle (long head) reveals 1 to 2K motor units with full recruitment. No fibrillations or positive waves were seen. The lumbosacral paraspinal muscles were tested at 3 levels, and revealed no abnormalities of insertional activity at all 3 levels tested. There was poor relaxation.   IMPRESSION:  Nerve conduction studies done on the left upper extremity and both lower extremities shows evidence of a primarily axonal peripheral neuropathy of moderate to severe severity. There is an overlying mild left carpal tunnel syndrome. EMG evaluation of the left lower extremity shows distal chronic and mild acute denervation consistent with the diagnosis of peripheral neuropathy. Proximal thigh muscles appear to have evidence of mild myopathic changes. No denervation was seen proximally.  Jill Alexanders MD 04/17/2014 2:05 PM  [facility] [address] [place], [place] [address]  Phone [phone] Fax [phone]

## 2014-04-17 NOTE — Progress Notes (Signed)
Please refer to EMG note. 

## 2014-04-18 ENCOUNTER — Encounter (HOSPITAL_BASED_OUTPATIENT_CLINIC_OR_DEPARTMENT_OTHER): Payer: Medicare Other | Attending: General Surgery

## 2014-04-18 DIAGNOSIS — T8189XA Other complications of procedures, not elsewhere classified, initial encounter: Secondary | ICD-10-CM | POA: Insufficient documentation

## 2014-04-18 DIAGNOSIS — S31109A Unspecified open wound of abdominal wall, unspecified quadrant without penetration into peritoneal cavity, initial encounter: Secondary | ICD-10-CM | POA: Insufficient documentation

## 2014-04-18 DIAGNOSIS — Y839 Surgical procedure, unspecified as the cause of abnormal reaction of the patient, or of later complication, without mention of misadventure at the time of the procedure: Secondary | ICD-10-CM | POA: Diagnosis not present

## 2014-04-28 ENCOUNTER — Encounter: Payer: Self-pay | Admitting: *Deleted

## 2014-05-01 DIAGNOSIS — S31109A Unspecified open wound of abdominal wall, unspecified quadrant without penetration into peritoneal cavity, initial encounter: Secondary | ICD-10-CM | POA: Diagnosis not present

## 2014-05-01 DIAGNOSIS — T8189XA Other complications of procedures, not elsewhere classified, initial encounter: Secondary | ICD-10-CM | POA: Diagnosis not present

## 2014-05-02 ENCOUNTER — Telehealth: Payer: Self-pay | Admitting: Neurology

## 2014-05-02 ENCOUNTER — Telehealth: Payer: Self-pay | Admitting: *Deleted

## 2014-05-02 NOTE — Telephone Encounter (Signed)
Patient questioning what he could be doing differently regarding the Neuropathy pain.  Please call and advise.

## 2014-05-02 NOTE — Telephone Encounter (Signed)
The problem is more pain with spasms.cramps in the legs and rectum. He is on neurontin and cymbalta. The neurontin is 600mg  three times a day. The cymbalta is 60mg  daily. He has neuropathic pain but the problem is the spasms. Pain is worse at night. Suggested increasing the night dose of neurontin to 900mg  or 1200mg  and keeping the day-time dosing the same.  Dr. Brett Fairy had suggested Gralise or Horizant in her last note.

## 2014-05-15 ENCOUNTER — Encounter (HOSPITAL_BASED_OUTPATIENT_CLINIC_OR_DEPARTMENT_OTHER): Payer: Medicare Other | Attending: Plastic Surgery

## 2014-05-15 DIAGNOSIS — L98499 Non-pressure chronic ulcer of skin of other sites with unspecified severity: Secondary | ICD-10-CM | POA: Diagnosis not present

## 2014-05-17 ENCOUNTER — Emergency Department (HOSPITAL_COMMUNITY): Payer: Medicare Other

## 2014-05-17 ENCOUNTER — Encounter (HOSPITAL_COMMUNITY): Payer: Self-pay | Admitting: Emergency Medicine

## 2014-05-17 ENCOUNTER — Telehealth: Payer: Self-pay | Admitting: Neurology

## 2014-05-17 ENCOUNTER — Emergency Department (HOSPITAL_COMMUNITY)
Admission: EM | Admit: 2014-05-17 | Discharge: 2014-05-17 | Disposition: A | Discharge status: Home / Self Care | Payer: Medicare Other | Attending: Emergency Medicine | Admitting: Emergency Medicine

## 2014-05-17 DIAGNOSIS — I2510 Atherosclerotic heart disease of native coronary artery without angina pectoris: Secondary | ICD-10-CM | POA: Insufficient documentation

## 2014-05-17 DIAGNOSIS — Z86718 Personal history of other venous thrombosis and embolism: Secondary | ICD-10-CM | POA: Insufficient documentation

## 2014-05-17 DIAGNOSIS — Z872 Personal history of diseases of the skin and subcutaneous tissue: Secondary | ICD-10-CM | POA: Insufficient documentation

## 2014-05-17 DIAGNOSIS — R509 Fever, unspecified: Secondary | ICD-10-CM | POA: Insufficient documentation

## 2014-05-17 DIAGNOSIS — G40909 Epilepsy, unspecified, not intractable, without status epilepticus: Secondary | ICD-10-CM | POA: Insufficient documentation

## 2014-05-17 DIAGNOSIS — Z933 Colostomy status: Secondary | ICD-10-CM | POA: Insufficient documentation

## 2014-05-17 DIAGNOSIS — I878 Other specified disorders of veins: Secondary | ICD-10-CM | POA: Insufficient documentation

## 2014-05-17 DIAGNOSIS — Z880 Allergy status to penicillin: Secondary | ICD-10-CM | POA: Insufficient documentation

## 2014-05-17 DIAGNOSIS — Z87438 Personal history of other diseases of male genital organs: Secondary | ICD-10-CM | POA: Insufficient documentation

## 2014-05-17 DIAGNOSIS — Z79899 Other long term (current) drug therapy: Secondary | ICD-10-CM | POA: Insufficient documentation

## 2014-05-17 DIAGNOSIS — J3489 Other specified disorders of nose and nasal sinuses: Secondary | ICD-10-CM | POA: Insufficient documentation

## 2014-05-17 DIAGNOSIS — M1990 Unspecified osteoarthritis, unspecified site: Secondary | ICD-10-CM | POA: Insufficient documentation

## 2014-05-17 DIAGNOSIS — E785 Hyperlipidemia, unspecified: Secondary | ICD-10-CM | POA: Insufficient documentation

## 2014-05-17 DIAGNOSIS — R569 Unspecified convulsions: Secondary | ICD-10-CM

## 2014-05-17 DIAGNOSIS — Z8659 Personal history of other mental and behavioral disorders: Secondary | ICD-10-CM | POA: Insufficient documentation

## 2014-05-17 DIAGNOSIS — G629 Polyneuropathy, unspecified: Secondary | ICD-10-CM | POA: Insufficient documentation

## 2014-05-17 DIAGNOSIS — Z87891 Personal history of nicotine dependence: Secondary | ICD-10-CM | POA: Insufficient documentation

## 2014-05-17 DIAGNOSIS — Z936 Other artificial openings of urinary tract status: Secondary | ICD-10-CM | POA: Insufficient documentation

## 2014-05-17 DIAGNOSIS — Z7901 Long term (current) use of anticoagulants: Secondary | ICD-10-CM | POA: Insufficient documentation

## 2014-05-17 LAB — I-STAT CHEM 8, ED
BUN: 13 mg/dL (ref 6–23)
CHLORIDE: 103 meq/L (ref 96–112)
CREATININE: 0.7 mg/dL (ref 0.50–1.35)
Calcium, Ion: 1.08 mmol/L — ABNORMAL LOW (ref 1.13–1.30)
GLUCOSE: 112 mg/dL — AB (ref 70–99)
HCT: 48 % (ref 39.0–52.0)
Hemoglobin: 16.3 g/dL (ref 13.0–17.0)
Potassium: 4.2 mmol/L (ref 3.5–5.1)
Sodium: 140 mmol/L (ref 135–145)
TCO2: 24 mmol/L (ref 0–100)

## 2014-05-17 LAB — CBC WITH DIFFERENTIAL/PLATELET
Basophils Absolute: 0 10*3/uL (ref 0.0–0.1)
Basophils Relative: 0 % (ref 0–1)
Eosinophils Absolute: 0.3 10*3/uL (ref 0.0–0.7)
Eosinophils Relative: 5 % (ref 0–5)
HEMATOCRIT: 45.7 % (ref 39.0–52.0)
Hemoglobin: 15 g/dL (ref 13.0–17.0)
LYMPHS ABS: 1.6 10*3/uL (ref 0.7–4.0)
LYMPHS PCT: 26 % (ref 12–46)
MCH: 30.9 pg (ref 26.0–34.0)
MCHC: 32.8 g/dL (ref 30.0–36.0)
MCV: 94 fL (ref 78.0–100.0)
MONOS PCT: 10 % (ref 3–12)
Monocytes Absolute: 0.6 10*3/uL (ref 0.1–1.0)
Neutro Abs: 3.8 10*3/uL (ref 1.7–7.7)
Neutrophils Relative %: 59 % (ref 43–77)
Platelets: 170 10*3/uL (ref 150–400)
RBC: 4.86 MIL/uL (ref 4.22–5.81)
RDW: 13.7 % (ref 11.5–15.5)
WBC: 6.3 10*3/uL (ref 4.0–10.5)

## 2014-05-17 LAB — COMPREHENSIVE METABOLIC PANEL
ALT: 35 U/L (ref 0–53)
AST: 24 U/L (ref 0–37)
Albumin: 3.1 g/dL — ABNORMAL LOW (ref 3.5–5.2)
Alkaline Phosphatase: 143 U/L — ABNORMAL HIGH (ref 39–117)
Anion gap: 8 (ref 5–15)
BUN: 10 mg/dL (ref 6–23)
CALCIUM: 8.4 mg/dL (ref 8.4–10.5)
CO2: 24 mmol/L (ref 19–32)
CREATININE: 0.66 mg/dL (ref 0.50–1.35)
Chloride: 107 mEq/L (ref 96–112)
GLUCOSE: 108 mg/dL — AB (ref 70–99)
Potassium: 4.3 mmol/L (ref 3.5–5.1)
SODIUM: 139 mmol/L (ref 135–145)
Total Bilirubin: 0.4 mg/dL (ref 0.3–1.2)
Total Protein: 5.8 g/dL — ABNORMAL LOW (ref 6.0–8.3)

## 2014-05-17 LAB — I-STAT CG4 LACTIC ACID, ED: Lactic Acid, Venous: 1.14 mmol/L (ref 0.5–2.2)

## 2014-05-17 LAB — CBG MONITORING, ED: Glucose-Capillary: 106 mg/dL — ABNORMAL HIGH (ref 70–99)

## 2014-05-17 LAB — CK: Total CK: 38 U/L (ref 7–232)

## 2014-05-17 LAB — URINALYSIS, ROUTINE W REFLEX MICROSCOPIC
Bilirubin Urine: NEGATIVE
Glucose, UA: NEGATIVE mg/dL
Hgb urine dipstick: NEGATIVE
Ketones, ur: NEGATIVE mg/dL
LEUKOCYTES UA: NEGATIVE
NITRITE: NEGATIVE
PROTEIN: NEGATIVE mg/dL
SPECIFIC GRAVITY, URINE: 1.021 (ref 1.005–1.030)
UROBILINOGEN UA: 0.2 mg/dL (ref 0.0–1.0)
pH: 6.5 (ref 5.0–8.0)

## 2014-05-17 LAB — RAPID URINE DRUG SCREEN, HOSP PERFORMED
Amphetamines: NOT DETECTED
Barbiturates: NOT DETECTED
Benzodiazepines: NOT DETECTED
Cocaine: NOT DETECTED
Opiates: NOT DETECTED
TETRAHYDROCANNABINOL: NOT DETECTED

## 2014-05-17 LAB — PHENYTOIN LEVEL, TOTAL

## 2014-05-17 LAB — ETHANOL: Alcohol, Ethyl (B): <5 mg/dL (ref 0–9)

## 2014-05-17 LAB — PROTIME-INR
INR: 2.15 — ABNORMAL HIGH (ref 0.00–1.49)
Prothrombin Time: 24.2 seconds — ABNORMAL HIGH (ref 11.6–15.2)

## 2014-05-17 LAB — LIPASE, BLOOD: LIPASE: 41 U/L (ref 11–59)

## 2014-05-17 LAB — MAGNESIUM: Magnesium: 2 mg/dL (ref 1.5–2.5)

## 2014-05-17 MED ORDER — AZITHROMYCIN 250 MG PO TABS: 250.0000 mg | ORAL_TABLET | Freq: Every day | ORAL | Status: DC

## 2014-05-17 MED ORDER — AZITHROMYCIN 250 MG PO TABS
500.0000 mg | ORAL_TABLET | Freq: Once | ORAL | Status: AC
  Administered 2014-05-17: 500 mg via ORAL
  Filled 2014-05-17: qty 2

## 2014-05-17 MED ORDER — SODIUM CHLORIDE 0.9 % IV SOLN
1000.0000 mg | Freq: Once | INTRAVENOUS | Status: AC
  Administered 2014-05-17: 1000 mg via INTRAVENOUS
  Filled 2014-05-17: qty 20

## 2014-05-17 MED ORDER — SODIUM CHLORIDE 0.9 % IV BOLUS (SEPSIS)
1000.0000 mL | Freq: Once | INTRAVENOUS | Status: AC
  Administered 2014-05-17: 1000 mL via INTRAVENOUS

## 2014-05-17 MED ORDER — PHENYTOIN SODIUM EXTENDED 100 MG PO CAPS: 100.0000 mg | ORAL_CAPSULE | Freq: Every day | ORAL | Status: DC

## 2014-05-17 NOTE — Discharge Instructions (Signed)
[name], you were seen today for shaking. These episodes are not consistent with a seizure. Your Dilantin level was also low. Your chest x-ray shows a pneumonia. You were given Dilantin through your IV, continue to take antibiotics as prescribed. Follow-up with your neurologist tomorrow regarding the twitching and your low Dilantin levels. Continue to take your Dilantin as prescribed. If symptoms worsen come back to emergency department immediately for repeat evaluation. Thank you. Tremor is a rhythmic, involuntary muscular contraction characterized by oscillations (to-and-fro movements) of a part of the body. The most common of all involuntary movements, tremor can affect various body parts such as the hands, head, facial structures, vocal cords, trunk, and legs; most tremors, however, occur in the hands. Tremor often accompanies neurological disorders associated with aging. Although the disorder is not life-threatening, it can be responsible for functional disability and social embarrassment. TREATMENT  There are many types of tremor and several ways in which tremor is classified. The most common classification is by behavioral context or position. There are five categories of tremor within this classification: resting, postural, kinetic, task-specific, and psychogenic. Resting or static tremor occurs when the muscle is at rest, for example when the hands are lying on the lap. This type of tremor is often seen in patients with Parkinson's disease. Postural tremor occurs when a patient attempts to maintain posture, such as holding the hands outstretched. Postural tremors include physiological tremor, essential tremor, tremor with basal ganglia disease (also seen in patients with Parkinson's disease), cerebellar postural tremor, tremor with peripheral neuropathy, post-traumatic tremor, and alcoholic tremor. Kinetic or intention (action) tremor occurs during purposeful movement, for example during  finger-to-nose testing. Task-specific tremor appears when performing goal-oriented tasks such as handwriting, speaking, or standing. This group consists of primary writing tremor, vocal tremor, and orthostatic tremor. Psychogenic tremor occurs in both older and younger patients. The key feature of this tremor is that it dramatically lessens or disappears when the patient is distracted. PROGNOSIS There are some treatment options available for tremor; the appropriate treatment depends on accurate diagnosis of the cause. Some tremors respond to treatment of the underlying condition, for example in some cases of psychogenic tremor treating the patient's underlying mental problem may cause the tremor to disappear. Also, patients with tremor due to Parkinson's disease may be treated with Levodopa drug therapy. Symptomatic drug therapy is available for several other tremors as well. For those cases of tremor in which there is no effective drug treatment, physical measures such as teaching the patient to brace the affected limb during the tremor are sometimes useful. Surgical intervention such as thalamotomy or deep brain stimulation may be useful in certain cases. Document Released: 04/18/2002 Document Revised: 07/21/2011 Document Reviewed: 04/28/2005 [facility] Patient Information 2015 [place], [place]. This information is not intended to replace advice given to you by your health care provider. Make sure you discuss any questions you have with your health care provider.

## 2014-05-17 NOTE — ED Notes (Signed)
The patient was at home and had several seizures from 1630hrs and also with EMS.  According to the sister, the patient was at physical therapy and he had a seizure in front or the PT.  The sister also said he had several absent seizures lasting 15-20sec.  The sister said they were happening every 52minutes lasting 15-20secs.  EMS also said he had some with them and they were getting more frequent.  He denies any nausea, vomiting, diarrhea or any other symptoms.  He does have several wounds and a colostomy bag.  He was transported here to be evaluated.

## 2014-05-17 NOTE — ED Notes (Signed)
The patient is unable to give an urine specimen at this time. The tech has reported to the RN in charge. 

## 2014-05-17 NOTE — ED Notes (Signed)
Family at bedside. 

## 2014-05-17 NOTE — ED Provider Notes (Signed)
CSN: [id number]     Arrival date & time 05/17/14  0120 History   This chart was scribed for Everlene Balls, MD by Chester Holstein, ED Scribe. This patient was seen in room A01C/A01C and the patient's care was started at 1:39 AM.    Chief Complaint  Patient presents with  . Seizures    The patient was at home and had several seizures from 1630hrs and also with EMS.        Patient is a 64 y.o. male presenting with seizures. The history is provided by the patient and a relative. No language interpreter was used.  Seizures   HPI Comments: HPI Comments: [name] is a 64 y.o. male brought in by ambulance, with PMHx of seizures, DVT, and peripheral neuropathy who presents to the Emergency Department complaining of abscence seizures every 15 or so minutes with onset today. Pt's sister notes he has had 15+ seizures lasting no more than 20 seconds, and he has been turning his head to the left side. Normally the patient has one seizure every couple of months. Pt notes his teeth have been chattering at onset. Pt's sister denies any post ictal fatigue. She notes recent rhinorrhea and subjective fever. Pt had an exploratory laparotomy in August, 2015. Sister notes he experienced some seizures status post where he turned his head to the left.  Pt takes Dilantin, Keppra, warfarin, and Neurontin. Pt denies abdominal pain, and coughing.   Past Medical History  Diagnosis Date  . Coronary artery disease   . Depression   . Peripheral vascular disease   . Seizures   . Deep vein thrombophlebitis of left leg   . Deep vein thrombosis of right lower extremity   . Psoriasis   . Arthritis   . DVT (deep venous thrombosis)     multiple times  . Weakness of both legs   . Peripheral neuropathy   . Venous stasis   . Hyperplasia of prostate   . BPH (benign prostatic hyperplasia)   . Hyperlipidemia    Past Surgical History  Procedure Laterality Date  . Colonoscopy  03/26/2012    Procedure: COLONOSCOPY;   Surgeon: Beryle Beams, MD;  Location: [facility];  Service: Endoscopy;  Laterality: N/A;  . Tonsillectomy      as child  . Flexible sigmoidoscopy N/A 12/09/2013    Procedure: FLEXIBLE SIGMOIDOSCOPY;  Surgeon: Beryle Beams, MD;  Location: [facility];  Service: Endoscopy;  Laterality: N/A;  . Laparotomy N/A 12/25/2013    Procedure: EXPLORATORY LAPAROTOMY/Transverse and right coloectomy/ end  ileostomy and mucus  fistula;  Surgeon: Ralene Ok, MD;  Location: [place];  Service: General;  Laterality: N/A;  . Ankle fracture surgery  09/04/13  . Colostomy  12-25-13    Ruptured colon   Family History  Problem Relation Age of Onset  . Vision loss Father   . Hypertension Father   . Diabetes Father   . Heart disease Father   . Cancer Father     colon  . Diabetes Mother   . Hypertension Mother   . Heart disease Mother   . Dementia Mother   . Cancer Mother     breast ca  . Diabetes Sister   . Hyperlipidemia Sister   . Hypertension Sister   . Diabetes Brother   . Kidney disease Brother   . Heart disease Brother    History  Substance Use Topics  . Smoking status: Former Smoker    Types: Pipe  Quit date: 08/10/2013  . Smokeless tobacco: Not on file  . Alcohol Use: No    Review of Systems  Constitutional: Positive for fever. Negative for fatigue.  HENT: Positive for rhinorrhea.   Respiratory: Negative for cough.   Gastrointestinal: Negative for abdominal pain.  Neurological: Positive for seizures.  A complete 10 system review of systems was obtained and all systems are negative except as noted in the HPI and PMH.      Allergies  Penicillins  Home Medications   Prior to Admission medications   Medication Sig Start Date End Date Taking? Authorizing Provider  acetaminophen (TYLENOL) 500 MG tablet Take 1 tablet (500 mg total) by mouth 2 (two) times daily as needed for mild pain or fever. 02/02/14   Modena Jansky, MD  Calcium Carb-Cholecalciferol (CALCIUM-VITAMIN D)  600-400 MG-UNIT TABS Take 1 tablet by mouth daily. For calcium supplement    Historical Provider, MD  enoxaparin (LOVENOX) 120 MG/0.8ML injection Inject 0.8 mLs (120 mg total) into the skin every 12 (twelve) hours. Discontinue once INR is greater than 2. 02/02/14   Modena Jansky, MD  gabapentin (NEURONTIN) 600 MG tablet Take 1 tablet (600 mg total) by mouth 3 (three) times daily. 03/31/14   Asencion Partridge Dohmeier, MD  Gabapentin, Once-Daily, 600 MG TABS G[place]ise 600 mg in AM and 1200 mg in PM. Po 03/31/14   Asencion Partridge Dohmeier, MD  HYDROcodone-acetaminophen (NORCO/VICODIN) 5-325 MG per tablet Take 1-2 tablets by mouth every 6 (six) hours as needed for moderate pain or severe pain. 02/02/14   Modena Jansky, MD  levETIRAcetam (KEPPRA) 750 MG tablet Take 2 tablets (1,500 mg total) by mouth 3 (three) times daily. For seizures 03/31/14   [name], MD  Multiple Vitamin (MULTIVITAMIN WITH MINERALS) TABS Take 1 tablet by mouth daily.    Historical Provider, MD  phenytoin (DILANTIN) 100 MG ER capsule Take 1 capsule (100 mg total) by mouth 4 (four) times daily. To be taken four times a day on Sunday, Tuesday, Thursday, and Saturday 03/31/14   [name], MD  phenytoin (DILANTIN) 100 MG ER capsule Take 1 capsule (100 mg total) by mouth 3 (three) times daily. Give 1 capsule by mouth three times daily on Monday, Wednesday, and Friday. 03/31/14   Asencion Partridge Dohmeier, MD  pravastatin (PRAVACHOL) 20 MG tablet Take 20 mg by mouth daily. For HLD    Historical Provider, MD  tamsulosin (FLOMAX) 0.4 MG CAPS capsule Take 0.4 mg by mouth daily. For BPH 03/21/13   Historical Provider, MD  [place] Name: Lewanda Rife Powder.  For use around colostomy    Historical Provider, MD  vitamin B-12 (CYANOCOBALAMIN) 1000 MCG tablet Take 1,000 mcg by mouth daily. For supplement    Historical Provider, MD  warfarin (COUMADIN) 1 MG tablet Take 2 mg by mouth daily.  03/15/14   Historical Provider, MD  warfarin (COUMADIN) 5 MG  tablet Take 5 mg by mouth daily. 03/15/14   Historical Provider, MD   BP 129/81 mmHg  Pulse 81  Temp(Src) 97.6 F (36.4 C) (Oral)  Resp 17  SpO2 94% Physical Exam  Constitutional: He is oriented to person, place, and time. Vital signs are normal. He appears well-developed and well-nourished.  Non-toxic appearance. He does not appear ill. No distress.  HENT:  Head: Normocephalic and atraumatic.  Nose: Nose normal.  Mouth/Throat: Oropharynx is clear and moist. No oropharyngeal exudate.  Eyes: Conjunctivae and EOM are normal. Pupils are equal, round, and reactive to light. No  scleral icterus.  Neck: Normal range of motion. Neck supple. No tracheal deviation, no edema, no erythema and normal range of motion present. No thyroid mass and no thyromegaly present.  Cardiovascular: Normal rate, regular rhythm, S1 normal, S2 normal, normal heart sounds, intact distal pulses and normal pulses.  Exam reveals no gallop and no friction rub.   No murmur heard. Pulses:      Radial pulses are 2+ on the right side, and 2+ on the left side.       Dorsalis pedis pulses are 2+ on the right side, and 2+ on the left side.  Pulmonary/Chest: Effort normal and breath sounds normal. No respiratory distress. He has no wheezes. He has no rhonchi. He has no rales.  Abdominal: Soft. Normal appearance and bowel sounds are normal. He exhibits no distension, no ascites and no mass. There is no hepatosplenomegaly. There is no tenderness. There is no rebound, no guarding and no CVA tenderness.  urostomy left lower quadrant Colostomy right lower quadrant  Musculoskeletal: Normal range of motion. He exhibits no edema or tenderness.  Lymphadenopathy:    He has no cervical adenopathy.  Neurological: He is alert and oriented to person, place, and time. He has normal strength. No cranial nerve deficit or sensory deficit. GCS eye subscore is 4. GCS verbal subscore is 5. GCS motor subscore is 6.  Skin: Skin is warm, dry and intact.  No petechiae noted. He is not diaphoretic.  Midline abdominal scar-well healing 2+ pitting edema to knees Chronic venous stasis  Psychiatric: He has a normal mood and affect. His behavior is normal. Judgment normal.  Nursing note and vitals reviewed.   ED Course  Procedures (including critical care time) DIAGNOSTIC STUDIES: Oxygen Saturation is 94% on room air, adequate by my interpretation.    COORDINATION OF CARE: 1:48 AM Discussed treatment plan with patient at beside, the patient agrees with the plan and has no further questions at this time.   Labs Review Labs Reviewed  PHENYTOIN LEVEL, TOTAL - Abnormal; Notable for the following:    Phenytoin Lvl <2.5 (*)    All other components within normal limits  COMPREHENSIVE METABOLIC PANEL - Abnormal; Notable for the following:    Glucose, Bld 108 (*)    Total Protein 5.8 (*)    Albumin 3.1 (*)    Alkaline Phosphatase 143 (*)    All other components within normal limits  PROTIME-INR - Abnormal; Notable for the following:    Prothrombin Time 24.2 (*)    INR 2.15 (*)    All other components within normal limits  I-STAT CHEM 8, ED - Abnormal; Notable for the following:    Glucose, Bld 112 (*)    Calcium, Ion 1.08 (*)    All other components within normal limits  CBC WITH DIFFERENTIAL  LIPASE, BLOOD  URINALYSIS, ROUTINE W REFLEX MICROSCOPIC  CK  URINE RAPID DRUG SCREEN (HOSP PERFORMED)  MAGNESIUM  ETHANOL  CBG MONITORING, ED  I-STAT CG4 LACTIC ACID, ED    Imaging Review Dg Chest 2 View  05/17/2014   CLINICAL DATA:  Acute onset of seizures. Right-sided abdominal pain. Initial encounter.  EXAM: CHEST  2 VIEW  COMPARISON:  Chest radiograph from 01/29/2014  FINDINGS: The lungs are well-aerated. Right basilar airspace opacity may reflect atelectasis or pneumonia. Mild vascular congestion is noted. No pleural effusion or pneumothorax is seen.  The heart is normal in size; the mediastinal contour is within normal limits. No acute  osseous abnormalities are seen.  IMPRESSION: Right basilar airspace opacity may reflect atelectasis or pneumonia. Mild vascular congestion noted.   Electronically Signed   By: Garald Balding M.D.   On: 05/17/2014 02:25     EKG Interpretation None      MDM   Final diagnoses:  Seizure    Patient since emergency department for increased frequency of seizures. I was able to witness several days in the room, they are not consistent with seizure-like activity. Patient is simply chattering his teeth. He is able to follow commands and speak throughout these. There is no post ictal state. He has no tongue biting or incontinence. Upon evaluation, chest x-ray reveals a pneumonia on the right side.  This may be exacerbating his symptoms. In addition his Dilantin level was essentially 0. He was loaded with Dilantin, given azithromycin emergency department. He was instructed to follow-up with his neurologist tomorrow regarding his Dilantin dosage. Also given a prescription for azithromycin 5 day course. At this time the patient has no further complaints, his vital signs remain within his normal limits and he is safe for discharge.  I personally performed the services described in this documentation, which was scribed in my presence. The recorded information has been reviewed and is accurate.      Everlene Balls, MD 05/17/14 [phone]

## 2014-05-17 NOTE — Telephone Encounter (Signed)
I called back.  Relayed Dr Dohmeier's message.  They verbalized understanding.  Will call us back if anything further is needed.

## 2014-05-17 NOTE — Telephone Encounter (Signed)
Will go from 300 mg alternative   with 400 mg dilantin , to 400 mg straight on.

## 2014-05-17 NOTE — ED Notes (Signed)
Patient is alert and orientedx4.  Patient was explained discharge instructions and they understood them with no questions.  The patient's sister, [name], is present and understands the discharge instructions and has no questions.

## 2014-05-17 NOTE — Telephone Encounter (Signed)
Patient's sister, Manuela Schwartz stated pt went to ER this am for having Seizures repeatedly.  Patient was diagnosed with Pneumonia and dilantin levels were extremely low.  ER gave IV of 1000 mg of Dilantin and antibiotics for pneumonia.  Sister requesting increase in dosage for Rx phenytoin (DILANTIN) 100 MG ER capsule.  Please call and advise.

## 2014-05-17 NOTE — ED Notes (Signed)
Dr. Oni at bedside. 

## 2014-05-19 ENCOUNTER — Telehealth: Payer: Self-pay

## 2014-05-19 DIAGNOSIS — G40109 Localization-related (focal) (partial) symptomatic epilepsy and epileptic syndromes with simple partial seizures, not intractable, without status epilepticus: Secondary | ICD-10-CM

## 2014-05-19 MED ORDER — LORAZEPAM 1 MG PO TABS: ORAL_TABLET | ORAL | Status: DC

## 2014-05-19 NOTE — Telephone Encounter (Signed)
Ms Dugar called and stated the patient has continued to have seizures on Dilantin 400mg .  She would like to know if the dose should be increased further.  States he is having multiple seizures daily.  Was in the hospital on 01/06, but has been sent home.  Says his Dilantin level was 2.5 on 01/06.  Please advise.  Thank you.

## 2014-05-19 NOTE — Telephone Encounter (Signed)
Message has been forwarded to provider.

## 2014-05-19 NOTE — Telephone Encounter (Signed)
Patient has suddenly many "little seizures " and has not been able to get dilantin level to  a therapeutic level.  He is taking 400 mg dilantin at AM , but he took Boost at the same time. Will change to 400 mg tonight  1 hour after last meal. He will also need to be seen on Tuesday at 8.30 AM  12 th January- please put him in as an urgent Work in for 30 minutes.  I will take today  1 mg ativan Po to allow him to rest.

## 2014-05-19 NOTE — Telephone Encounter (Signed)
Patient's sister stated increasing Dilantin to 400 mg has not helped with Seizures.  Please call and advise.

## 2014-05-20 ENCOUNTER — Telehealth: Payer: Self-pay | Admitting: Neurology

## 2014-05-20 ENCOUNTER — Inpatient Hospital Stay (HOSPITAL_COMMUNITY): Payer: Medicare Other

## 2014-05-20 ENCOUNTER — Inpatient Hospital Stay (HOSPITAL_COMMUNITY)
Admission: EM | Admit: 2014-05-20 | Discharge: 2014-05-24 | DRG: 100 | Disposition: A | Discharge status: Home Health | Payer: Medicare Other | Attending: Internal Medicine | Admitting: Internal Medicine

## 2014-05-20 ENCOUNTER — Emergency Department (HOSPITAL_COMMUNITY): Payer: Medicare Other

## 2014-05-20 DIAGNOSIS — Z8672 Personal history of thrombophlebitis: Secondary | ICD-10-CM

## 2014-05-20 DIAGNOSIS — R739 Hyperglycemia, unspecified: Secondary | ICD-10-CM | POA: Diagnosis not present

## 2014-05-20 DIAGNOSIS — Z7901 Long term (current) use of anticoagulants: Secondary | ICD-10-CM

## 2014-05-20 DIAGNOSIS — J9601 Acute respiratory failure with hypoxia: Secondary | ICD-10-CM | POA: Diagnosis present

## 2014-05-20 DIAGNOSIS — D696 Thrombocytopenia, unspecified: Secondary | ICD-10-CM | POA: Diagnosis present

## 2014-05-20 DIAGNOSIS — N400 Enlarged prostate without lower urinary tract symptoms: Secondary | ICD-10-CM | POA: Diagnosis not present

## 2014-05-20 DIAGNOSIS — F329 Major depressive disorder, single episode, unspecified: Secondary | ICD-10-CM | POA: Diagnosis not present

## 2014-05-20 DIAGNOSIS — I739 Peripheral vascular disease, unspecified: Secondary | ICD-10-CM | POA: Diagnosis not present

## 2014-05-20 DIAGNOSIS — Z9889 Other specified postprocedural states: Secondary | ICD-10-CM

## 2014-05-20 DIAGNOSIS — Z23 Encounter for immunization: Secondary | ICD-10-CM

## 2014-05-20 DIAGNOSIS — Z86718 Personal history of other venous thrombosis and embolism: Secondary | ICD-10-CM

## 2014-05-20 DIAGNOSIS — G40301 Generalized idiopathic epilepsy and epileptic syndromes, not intractable, with status epilepticus: Secondary | ICD-10-CM

## 2014-05-20 DIAGNOSIS — E785 Hyperlipidemia, unspecified: Secondary | ICD-10-CM | POA: Diagnosis not present

## 2014-05-20 DIAGNOSIS — R569 Unspecified convulsions: Secondary | ICD-10-CM | POA: Diagnosis present

## 2014-05-20 DIAGNOSIS — M1990 Unspecified osteoarthritis, unspecified site: Secondary | ICD-10-CM | POA: Diagnosis not present

## 2014-05-20 DIAGNOSIS — G40901 Epilepsy, unspecified, not intractable, with status epilepticus: Principal | ICD-10-CM | POA: Diagnosis present

## 2014-05-20 DIAGNOSIS — G629 Polyneuropathy, unspecified: Secondary | ICD-10-CM | POA: Diagnosis present

## 2014-05-20 DIAGNOSIS — Z87891 Personal history of nicotine dependence: Secondary | ICD-10-CM | POA: Diagnosis not present

## 2014-05-20 DIAGNOSIS — Z933 Colostomy status: Secondary | ICD-10-CM | POA: Diagnosis not present

## 2014-05-20 DIAGNOSIS — I2510 Atherosclerotic heart disease of native coronary artery without angina pectoris: Secondary | ICD-10-CM | POA: Diagnosis not present

## 2014-05-20 DIAGNOSIS — L409 Psoriasis, unspecified: Secondary | ICD-10-CM | POA: Diagnosis not present

## 2014-05-20 DIAGNOSIS — I4891 Unspecified atrial fibrillation: Secondary | ICD-10-CM | POA: Diagnosis not present

## 2014-05-20 DIAGNOSIS — J189 Pneumonia, unspecified organism: Secondary | ICD-10-CM | POA: Diagnosis present

## 2014-05-20 LAB — URINALYSIS, ROUTINE W REFLEX MICROSCOPIC
Bilirubin Urine: NEGATIVE
GLUCOSE, UA: NEGATIVE mg/dL
Hgb urine dipstick: NEGATIVE
Ketones, ur: NEGATIVE mg/dL
Leukocytes, UA: NEGATIVE
Nitrite: NEGATIVE
PH: 5 (ref 5.0–8.0)
Protein, ur: NEGATIVE mg/dL
Specific Gravity, Urine: 1.021 (ref 1.005–1.030)
UROBILINOGEN UA: 0.2 mg/dL (ref 0.0–1.0)

## 2014-05-20 LAB — COMPREHENSIVE METABOLIC PANEL
ALBUMIN: 3.2 g/dL — AB (ref 3.5–5.2)
ALK PHOS: 151 U/L — AB (ref 39–117)
ALT: 30 U/L (ref 0–53)
AST: 20 U/L (ref 0–37)
Anion gap: 4 — ABNORMAL LOW (ref 5–15)
BILIRUBIN TOTAL: 0.3 mg/dL (ref 0.3–1.2)
BUN: 9 mg/dL (ref 6–23)
CO2: 28 mmol/L (ref 19–32)
CREATININE: 0.55 mg/dL (ref 0.50–1.35)
Calcium: 8.7 mg/dL (ref 8.4–10.5)
Chloride: 104 mEq/L (ref 96–112)
GFR calc Af Amer: >90 mL/min (ref >90)
GFR calc non Af Amer: >90 mL/min (ref >90)
GLUCOSE: 101 mg/dL — AB (ref 70–99)
Potassium: 4.3 mmol/L (ref 3.5–5.1)
SODIUM: 136 mmol/L (ref 135–145)
TOTAL PROTEIN: 6.3 g/dL (ref 6.0–8.3)

## 2014-05-20 LAB — CBC WITH DIFFERENTIAL/PLATELET
BASOS ABS: 0 10*3/uL (ref 0.0–0.1)
Basophils Relative: 0 % (ref 0–1)
EOS ABS: 0.2 10*3/uL (ref 0.0–0.7)
Eosinophils Relative: 3 % (ref 0–5)
HCT: 46.3 % (ref 39.0–52.0)
Hemoglobin: 15.4 g/dL (ref 13.0–17.0)
LYMPHS ABS: 1 10*3/uL (ref 0.7–4.0)
LYMPHS PCT: 17 % (ref 12–46)
MCH: 30.9 pg (ref 26.0–34.0)
MCHC: 33.3 g/dL (ref 30.0–36.0)
MCV: 92.8 fL (ref 78.0–100.0)
MONO ABS: 0.5 10*3/uL (ref 0.1–1.0)
Monocytes Relative: 8 % (ref 3–12)
Neutro Abs: 4.3 10*3/uL (ref 1.7–7.7)
Neutrophils Relative %: 72 % (ref 43–77)
Platelets: 149 10*3/uL — ABNORMAL LOW (ref 150–400)
RBC: 4.99 MIL/uL (ref 4.22–5.81)
RDW: 13.4 % (ref 11.5–15.5)
WBC: 6 10*3/uL (ref 4.0–10.5)

## 2014-05-20 LAB — I-STAT VENOUS BLOOD GAS, ED
Acid-Base Excess: 3 mmol/L — ABNORMAL HIGH (ref 0.0–2.0)
BICARBONATE: 30.7 meq/L — AB (ref 20.0–24.0)
O2 Saturation: 100 %
PO2 VEN: 239 mmHg — AB (ref 30.0–45.0)
TCO2: 32 mmol/L (ref 0–100)
pCO2, Ven: 56.8 mmHg — ABNORMAL HIGH (ref 45.0–50.0)
pH, Ven: 7.341 — ABNORMAL HIGH (ref 7.250–7.300)

## 2014-05-20 LAB — I-STAT CG4 LACTIC ACID, ED: Lactic Acid, Venous: 1.17 mmol/L (ref 0.5–2.2)

## 2014-05-20 LAB — PROTIME-INR
INR: 2.31 — AB (ref 0.00–1.49)
PROTHROMBIN TIME: 25.6 s — AB (ref 11.6–15.2)

## 2014-05-20 LAB — POCT I-STAT 3, ART BLOOD GAS (G3+)
Acid-Base Excess: 1 mmol/L (ref 0.0–2.0)
Bicarbonate: 23.7 mEq/L (ref 20.0–24.0)
O2 Saturation: 100 %
Patient temperature: 98.6
TCO2: 25 mmol/L (ref 0–100)
pCO2 arterial: 31.2 mmHg — ABNORMAL LOW (ref 35.0–45.0)
pH, Arterial: 7.488 — ABNORMAL HIGH (ref 7.350–7.450)
pO2, Arterial: 198 mmHg — ABNORMAL HIGH (ref 80.0–100.0)

## 2014-05-20 LAB — MRSA PCR SCREENING: MRSA by PCR: NEGATIVE

## 2014-05-20 LAB — PHENYTOIN LEVEL, TOTAL: Phenytoin Lvl: 7.2 ug/mL — ABNORMAL LOW (ref 10.0–20.0)

## 2014-05-20 MED ORDER — HEPARIN SODIUM (PORCINE) 5000 UNIT/ML IJ SOLN: 5000.0000 [IU] | Freq: Three times a day (TID) | INTRAMUSCULAR | Status: DC

## 2014-05-20 MED ORDER — PROPOFOL 10 MG/ML IV EMUL
5.0000 ug/kg/min | INTRAVENOUS | Status: DC
  Administered 2014-05-20: 50 ug/kg/min via INTRAVENOUS
  Administered 2014-05-20: 40 ug/kg/min via INTRAVENOUS
  Administered 2014-05-21: 50 ug/kg/min via INTRAVENOUS
  Administered 2014-05-21 (×2): 30 ug/kg/min via INTRAVENOUS
  Filled 2014-05-20 (×3): qty 100
  Filled 2014-05-20: qty 200

## 2014-05-20 MED ORDER — CHLORHEXIDINE GLUCONATE 0.12 % MT SOLN
15.0000 mL | Freq: Two times a day (BID) | OROMUCOSAL | Status: DC
  Administered 2014-05-21: 15 mL via OROMUCOSAL
  Filled 2014-05-20: qty 15

## 2014-05-20 MED ORDER — ETOMIDATE 2 MG/ML IV SOLN
INTRAVENOUS | Status: AC | PRN
  Administered 2014-05-20: 10 mg via INTRAVENOUS

## 2014-05-20 MED ORDER — PROPOFOL 10 MG/ML IV EMUL
INTRAVENOUS | Status: AC
  Filled 2014-05-20: qty 100

## 2014-05-20 MED ORDER — PROPOFOL 10 MG/ML IV BOLUS
INTRAVENOUS | Status: AC
  Filled 2014-05-20: qty 20

## 2014-05-20 MED ORDER — LEVETIRACETAM IN NACL 1000 MG/100ML IV SOLN
1000.0000 mg | Freq: Once | INTRAVENOUS | Status: AC
  Administered 2014-05-20: 1000 mg via INTRAVENOUS
  Filled 2014-05-20: qty 100

## 2014-05-20 MED ORDER — WARFARIN SODIUM 6 MG PO TABS
7.0000 mg | ORAL_TABLET | Freq: Every day | ORAL | Status: DC
  Administered 2014-05-20: 7 mg via ORAL
  Filled 2014-05-20 (×2): qty 1

## 2014-05-20 MED ORDER — PRAVASTATIN SODIUM 20 MG PO TABS
20.0000 mg | ORAL_TABLET | Freq: Every day | ORAL | Status: DC
  Filled 2014-05-20: qty 1

## 2014-05-20 MED ORDER — PROPOFOL 10 MG/ML IV BOLUS
INTRAVENOUS | Status: AC | PRN
  Administered 2014-05-20: 40 mg via INTRAVENOUS

## 2014-05-20 MED ORDER — ALBUTEROL SULFATE (2.5 MG/3ML) 0.083% IN NEBU
2.5000 mg | INHALATION_SOLUTION | RESPIRATORY_TRACT | Status: DC | PRN
Start: 2014-05-20 — End: 2014-05-24

## 2014-05-20 MED ORDER — SUCCINYLCHOLINE CHLORIDE 20 MG/ML IJ SOLN
INTRAMUSCULAR | Status: AC
  Filled 2014-05-20: qty 1

## 2014-05-20 MED ORDER — LORAZEPAM 2 MG/ML IJ SOLN
0.5000 mg | Freq: Once | INTRAMUSCULAR | Status: AC
  Administered 2014-05-20: 1 mg via INTRAVENOUS
  Administered 2014-05-20: 0.5 mg via INTRAVENOUS

## 2014-05-20 MED ORDER — LEVOFLOXACIN IN D5W 750 MG/150ML IV SOLN
750.0000 mg | INTRAVENOUS | Status: AC
  Administered 2014-05-20 – 2014-05-21 (×2): 750 mg via INTRAVENOUS
  Filled 2014-05-20 (×2): qty 150

## 2014-05-20 MED ORDER — SODIUM CHLORIDE 0.9 % IV SOLN
400.0000 mg | Freq: Every day | INTRAVENOUS | Status: DC
  Administered 2014-05-21 – 2014-05-23 (×3): 400 mg via INTRAVENOUS
  Filled 2014-05-20 (×6): qty 8

## 2014-05-20 MED ORDER — SODIUM CHLORIDE 0.9 % IV SOLN
1000.0000 mg | Freq: Once | INTRAVENOUS | Status: AC
  Administered 2014-05-20: 1000 mg via INTRAVENOUS
  Filled 2014-05-20: qty 20

## 2014-05-20 MED ORDER — GABAPENTIN 600 MG PO TABS
600.0000 mg | ORAL_TABLET | Freq: Three times a day (TID) | ORAL | Status: DC
  Administered 2014-05-20 – 2014-05-24 (×11): 600 mg
  Filled 2014-05-20 (×13): qty 1

## 2014-05-20 MED ORDER — ETOMIDATE 2 MG/ML IV SOLN
INTRAVENOUS | Status: AC
  Filled 2014-05-20: qty 20

## 2014-05-20 MED ORDER — PANTOPRAZOLE SODIUM 40 MG PO PACK
40.0000 mg | PACK | Freq: Every day | ORAL | Status: DC
  Administered 2014-05-20 – 2014-05-21 (×2): 40 mg
  Filled 2014-05-20 (×2): qty 20

## 2014-05-20 MED ORDER — LORAZEPAM 2 MG/ML IJ SOLN
INTRAMUSCULAR | Status: AC
  Administered 2014-05-20: 2 mg
  Filled 2014-05-20: qty 1

## 2014-05-20 MED ORDER — ROCURONIUM BROMIDE 50 MG/5ML IV SOLN
INTRAVENOUS | Status: AC
  Filled 2014-05-20: qty 2

## 2014-05-20 MED ORDER — LORAZEPAM 2 MG/ML IJ SOLN
INTRAMUSCULAR | Status: AC
  Administered 2014-05-20: 1 mg via INTRAVENOUS
  Filled 2014-05-20: qty 1

## 2014-05-20 MED ORDER — LEVETIRACETAM IN NACL 1500 MG/100ML IV SOLN
1500.0000 mg | Freq: Three times a day (TID) | INTRAVENOUS | Status: DC
  Administered 2014-05-20 – 2014-05-24 (×12): 1500 mg via INTRAVENOUS
  Filled 2014-05-20 (×14): qty 100

## 2014-05-20 MED ORDER — DULOXETINE HCL 60 MG PO CPEP
60.0000 mg | ORAL_CAPSULE | Freq: Every day | ORAL | Status: DC
  Administered 2014-05-21 – 2014-05-24 (×4): 60 mg via ORAL
  Filled 2014-05-20 (×4): qty 1

## 2014-05-20 MED ORDER — SUCCINYLCHOLINE CHLORIDE 20 MG/ML IJ SOLN
INTRAMUSCULAR | Status: AC | PRN
  Administered 2014-05-20: 100 mg via INTRAVENOUS

## 2014-05-20 MED ORDER — PROPOFOL 10 MG/ML IV EMUL
INTRAVENOUS | Status: AC | PRN
  Administered 2014-05-20: 40 ug/kg/min via INTRAVENOUS

## 2014-05-20 MED ORDER — LIDOCAINE HCL (CARDIAC) 20 MG/ML IV SOLN
INTRAVENOUS | Status: AC
  Filled 2014-05-20: qty 5

## 2014-05-20 MED ORDER — CETYLPYRIDINIUM CHLORIDE 0.05 % MT LIQD
7.0000 mL | Freq: Four times a day (QID) | OROMUCOSAL | Status: DC
  Administered 2014-05-21 (×3): 7 mL via OROMUCOSAL

## 2014-05-20 MED ORDER — SODIUM CHLORIDE 0.9 % IV SOLN
1000.0000 mg | Freq: Once | INTRAVENOUS | Status: DC
  Filled 2014-05-20: qty 20

## 2014-05-20 MED ORDER — WARFARIN - PHARMACIST DOSING INPATIENT
Freq: Every day | Status: DC
  Administered 2014-05-24: 18:00:00

## 2014-05-20 NOTE — ED Notes (Signed)
MD at bedside, witnessing seizure. Family at bedside.

## 2014-05-20 NOTE — ED Notes (Signed)
Sabra Heck, MD at bedside. Verbal order for 0.5mg  ativan for recurrent seizures.

## 2014-05-20 NOTE — Progress Notes (Signed)
STAT EEG completed; results pending. 

## 2014-05-20 NOTE — Sedation Documentation (Signed)
MD ordered ativan for ongoing seizures. Continues to reorder ativan. Pt increasingly lethargic. Pt placed on non-rebreather mask for O2 sats in low 90's. Sabra Heck, MD at bedside. RSI kit ordered. Respiratory at bedside for intubation. Pt intubated by Sabra Heck, MD _0 . Stat portable Xray ordered to confirm placement. Labs per MD orders drawn.

## 2014-05-20 NOTE — ED Provider Notes (Signed)
MSE was initiated and I personally evaluated the patient and placed orders (if any) at  3:55 PM on May 20, 2014.  Patient arrives via EMS with focal seizure activity to his left face lasting a few seconds at a time. Symptoms have been ongoing for the past 4 days and there was increasing in his Dilantin dose and the raise up and was added. Patient with no fever. He is able to follow some commands during the episodes and there is no post ictal state. Seen in the ED 3 days ago and was given Dilantin loading dose.  Patient has intermittent rhythmic jerking of left face lasting 10-15 seconds at a time. He is interactive after these episodes.  The patient appears stable so that the remainder of the MSE may be completed by another provider.   Ezequiel Essex, MD 05/20/14 [phone]

## 2014-05-20 NOTE — ED Provider Notes (Signed)
CSN: [id number]     Arrival date & time 05/20/14  1522 History   First MD Initiated Contact with Patient 05/20/14 1606     Chief Complaint  Patient presents with  . Seizures     (Consider location/radiation/quality/duration/timing/severity/associated sxs/prior Treatment) HPI Comments: The patient is a 64 year old male with a complicated past medical history including history of epilepsy as well as a recent history of a perforated colon requiring an exploratory laparotomy with diverting colostomy, over the last several weeks the patient has been having episodes of what appears to be focal seizures where his head turns to the left, his teeth chatter and he is partially unresponsive though he does appear to be following some commands during these episodes. They last several minutes then resolve spontaneously and they have been intermittent. He was seen several days ago, his Keppra level was undetectable, he had a Dilantin load in the emergency department, a possible pneumonia for which she was prescribed an antibiotic and was sent home. He initially did well but has had intermittent and increasing in frequency episodes of these apparent facial focal seizures and today has had more than 10 episodes. He does not appear to return to baseline and during these. She has some left facial droop, left arm and left leg weakness, several of these have been witnessed by myself during my initial evaluation. He is followed by neurology, he was prescribed Ativan for breakthrough and to help terminate the seizure events.  Patient is a 64 y.o. male presenting with seizures. The history is provided by the patient, the spouse, the EMS personnel and medical records.  Seizures   Past Medical History  Diagnosis Date  . Coronary artery disease   . Depression   . Peripheral vascular disease   . Seizures   . Deep vein thrombophlebitis of left leg   . Deep vein thrombosis of right lower extremity   . Psoriasis   .  Arthritis   . DVT (deep venous thrombosis)     multiple times  . Weakness of both legs   . Peripheral neuropathy   . Venous stasis   . Hyperplasia of prostate   . BPH (benign prostatic hyperplasia)   . Hyperlipidemia    Past Surgical History  Procedure Laterality Date  . Colonoscopy  03/26/2012    Procedure: COLONOSCOPY;  Surgeon: Beryle Beams, MD;  Location: [facility];  Service: Endoscopy;  Laterality: N/A;  . Tonsillectomy      as child  . Flexible sigmoidoscopy N/A 12/09/2013    Procedure: FLEXIBLE SIGMOIDOSCOPY;  Surgeon: Beryle Beams, MD;  Location: [facility];  Service: Endoscopy;  Laterality: N/A;  . Laparotomy N/A 12/25/2013    Procedure: EXPLORATORY LAPAROTOMY/Transverse and right coloectomy/ end  ileostomy and mucus  fistula;  Surgeon: Ralene Ok, MD;  Location: [place];  Service: General;  Laterality: N/A;  . Ankle fracture surgery  09/04/13  . Colostomy  12-25-13    Ruptured colon   Family History  Problem Relation Age of Onset  . Vision loss Father   . Hypertension Father   . Diabetes Father   . Heart disease Father   . Cancer Father     colon  . Diabetes Mother   . Hypertension Mother   . Heart disease Mother   . Dementia Mother   . Cancer Mother     breast ca  . Diabetes Sister   . Hyperlipidemia Sister   . Hypertension Sister   . Diabetes Brother   .  Kidney disease Brother   . Heart disease Brother    History  Substance Use Topics  . Smoking status: Former Smoker    Types: Pipe    Quit date: 08/10/2013  . Smokeless tobacco: Not on file  . Alcohol Use: No    Review of Systems  Neurological: Positive for seizures.  All other systems reviewed and are negative.     Allergies  Penicillins  Home Medications   Prior to Admission medications   Medication Sig Start Date End Date Taking? Authorizing Provider  acetaminophen (TYLENOL) 500 MG tablet Take 1 tablet (500 mg total) by mouth 2 (two) times daily as needed for mild pain or  fever. 02/02/14   Modena Jansky, MD  azithromycin (ZITHROMAX) 250 MG tablet Take 1 tablet (250 mg total) by mouth daily. 05/17/14   Everlene Balls, MD  Calcium Carb-Cholecalciferol (CALCIUM-VITAMIN D) 600-400 MG-UNIT TABS Take 1 tablet by mouth daily. For calcium supplement    Historical Provider, MD  DULoxetine (CYMBALTA) 60 MG capsule Take 60 mg by mouth daily.    Historical Provider, MD  enoxaparin (LOVENOX) 120 MG/0.8ML injection Inject 0.8 mLs (120 mg total) into the skin every 12 (twelve) hours. Discontinue once INR is greater than 2. Patient not taking: Reported on 05/17/2014 02/02/14   Modena Jansky, MD  gabapentin (NEURONTIN) 600 MG tablet Take 1 tablet (600 mg total) by mouth 3 (three) times daily. Patient taking differently: Take 600 mg by mouth 4 (four) times daily -  before meals and at bedtime.  03/31/14   Asencion Partridge Dohmeier, MD  Gabapentin, Once-Daily, 600 MG TABS G[place]ise 600 mg in AM and 1200 mg in PM. Po Patient not taking: Reported on 05/17/2014 03/31/14   [name], MD  HYDROcodone-acetaminophen (NORCO/VICODIN) 5-325 MG per tablet Take 1-2 tablets by mouth every 6 (six) hours as needed for moderate pain or severe pain. Patient not taking: Reported on 05/17/2014 02/02/14   Modena Jansky, MD  levETIRAcetam (KEPPRA) 750 MG tablet Take 2 tablets (1,500 mg total) by mouth 3 (three) times daily. For seizures 03/31/14   [name], MD  LORazepam (ATIVAN) 1 MG tablet Use 1 mg tab to break focal seizure activity. As needed, do not exceed 2 mg a day. 05/19/14   Asencion Partridge Dohmeier, MD  Multiple Vitamin (MULTIVITAMIN WITH MINERALS) TABS Take 1 tablet by mouth daily.    Historical Provider, MD  phenytoin (DILANTIN) 100 MG ER capsule Take 1 capsule (100 mg total) by mouth 3 (three) times daily. Give 1 capsule by mouth three times daily on Monday, Wednesday, and Friday. 03/31/14   Asencion Partridge Dohmeier, MD  phenytoin (DILANTIN) 100 MG ER capsule Take 1 capsule (100 mg total) by mouth at bedtime. To be  taken four times a day on Sunday, Tuesday, Thursday, and Saturday 05/17/14   [name], MD  pravastatin (PRAVACHOL) 20 MG tablet Take 20 mg by mouth daily. For HLD    Historical Provider, MD  tamsulosin (FLOMAX) 0.4 MG CAPS capsule Take 0.4 mg by mouth daily. For BPH 03/21/13   Historical Provider, MD  [place] Name: Lewanda Rife Powder.  For use around colostomy    Historical Provider, MD  vitamin B-12 (CYANOCOBALAMIN) 1000 MCG tablet Take 1,000 mcg by mouth daily. For supplement    Historical Provider, MD  warfarin (COUMADIN) 1 MG tablet Take 2 mg by mouth daily.  03/15/14   Historical Provider, MD  warfarin (COUMADIN) 5 MG tablet Take 7.5 mg by mouth daily.  03/15/14   Historical Provider, MD   BP 186/94 mmHg  Pulse 93  Temp(Src) 98.4 F (36.9 C) (Oral)  Resp 20  Ht 6\' 3"  (1.905 m)  SpO2 100% Physical Exam  Constitutional: He appears well-developed and well-nourished. No distress.  HENT:  Head: Normocephalic and atraumatic.  Mouth/Throat: Oropharynx is clear and moist. No oropharyngeal exudate.  Eyes: Conjunctivae and EOM are normal. Pupils are equal, round, and reactive to light. Right eye exhibits no discharge. Left eye exhibits no discharge. No scleral icterus.  Neck: Normal range of motion. Neck supple. No JVD present. No thyromegaly present.  Cardiovascular: Normal rate, regular rhythm, normal heart sounds and intact distal pulses.  Exam reveals no gallop and no friction rub.   No murmur heard. Pulmonary/Chest: Effort normal and breath sounds normal. No respiratory distress. He has no wheezes. He has no rales.  Abdominal: Soft. Bowel sounds are normal. He exhibits no distension and no mass. There is no tenderness.  Abdomen is soft and nontender, there is a exploratory laparotomy wound healing by secondary intent, no significant drainage, no significant surrounding redness, right wound ostomy with drainage, left wound covered, no tenderness, no distention, no tympanitic  sounds to percussion  Musculoskeletal: Normal range of motion. He exhibits no edema or tenderness.  Lymphadenopathy:    He has no cervical adenopathy.  Neurological: He is alert. Coordination normal.  Somnolent, follows commands, moves all 4 extremities, decreased strength in all 4 extremities, has intermittent seizure activity with left face rhythmic jerking, left facial droop, left arm and left leg weakness. He is able to grip with his right hand during these episodes.  Skin: Skin is warm and dry. No rash noted. No erythema.  Psychiatric: He has a normal mood and affect. His behavior is normal.  Nursing note and vitals reviewed.   ED Course  NG placement Date/Time: 05/20/2014 5:25 PM Performed by: Noemi Chapel D Authorized by: Noemi Chapel D Consent: The procedure was performed in an emergent situation. Risks and benefits: risks, benefits and alternatives were discussed Consent given by: spouse Patient understanding: patient states understanding of the procedure being performed Required items: required blood products, implants, devices, and special equipment available Patient identity confirmed: arm band Time out: Immediately prior to procedure a "time out" was called to verify the correct patient, procedure, equipment, support staff and site/side marked as required. Patient tolerance: Patient tolerated the procedure well with no immediate complications Comments: NG placed - R nare, X 1 attempt by myself.   (including critical care time) Labs Review Labs Reviewed  CBC WITH DIFFERENTIAL - Abnormal; Notable for the following:    Platelets 149 (*)    All other components within normal limits  COMPREHENSIVE METABOLIC PANEL - Abnormal; Notable for the following:    Glucose, Bld 101 (*)    Albumin 3.2 (*)    Alkaline Phosphatase 151 (*)    Anion gap 4 (*)    All other components within normal limits  PHENYTOIN LEVEL, TOTAL - Abnormal; Notable for the following:    Phenytoin Lvl 7.2  (*)    All other components within normal limits  PROTIME-INR - Abnormal; Notable for the following:    Prothrombin Time 25.6 (*)    INR 2.31 (*)    All other components within normal limits  URINALYSIS, ROUTINE W REFLEX MICROSCOPIC  I-STAT CG4 LACTIC ACID, ED  I-STAT VENOUS BLOOD GAS, ED    Imaging Review Ct Head Wo Contrast  05/20/2014   CLINICAL DATA:  Seizures,  patient had seizure in CT  EXAM: CT HEAD WITHOUT CONTRAST  TECHNIQUE: Contiguous axial images were obtained from the base of the skull through the vertex without intravenous contrast.  COMPARISON:  MRI brain 12140 for  FINDINGS: There is no evidence of mass effect, midline shift, or extra-axial fluid collections. There is no evidence of a space-occupying lesion or intracranial hemorrhage. There is no evidence of a cortical-based area of acute infarction. There is generalized cerebral atrophy. There is periventricular white matter low attenuation likely secondary to microangiopathy.  The ventricles and sulci are appropriate for the patient's age. The basal cisterns are patent.  Visualized portions of the orbits are unremarkable. Is near complete opacification of bilateral maxillary sinuses, ethmoid sinuses and frontal sinus cyst. There is mild mucosal thickening of the left sphenoid sinus with an air-fluid level.  The osseous structures are unremarkable.  IMPRESSION: 1. No acute intracranial pathology. 2. Pansinus disease.   Electronically Signed   By: Kathreen Devoid   On: 05/20/2014 17:23     EKG Interpretation   Date/Time:  Saturday May 20 2014 15:40:15 EST Ventricular Rate:  67 PR Interval:  169 QRS Duration: 106 QT Interval:  395 QTC Calculation: 417 R Axis:   -26 Text Interpretation:  Sinus rhythm Borderline left axis deviation No  significant change was found Confirmed by Wyvonnia Dusky  MD, STEPHEN [phone]) on  05/20/2014 3:57:17 PM      MDM   Final diagnoses:  Seizure  Status epilepticus    The patient is apparently  having frequent focal seizures, he does not appear to return to baseline between these, I would consider to status epilepticus, he will be placed on a cardiac monitor, supplemental oxygen, check Dilantin level, give extra load of Keppra, IV Ativan has been dosed, CT scan of the head will be obtained as I do not see any brain imaging since 2004, neuro hospitalist consult requested.  The patient continues to have increased somnolence and requiring increased benzodiazepine for  treatment of ongoing seizures, they are frequent, they are worsening, neuro hospitalist has requested that we assist with airway management as he is requiring sedation that is going to prevent protection of his airway. The patient was intubated by myself for airway protection and decreased respiratory drive.   The patient will need to go to the intensive care unit for neuro monitoring, stat EEG and ongoing antiepileptics. Propofol ordered.   INTUBATION Performed by: Johnna Acosta  Required items: required blood products, implants, devices, and special equipment available Patient identity confirmed: provided demographic data and hospital-assigned identification number Time out: Immediately prior to procedure a "time out" was called to verify the correct patient, procedure, equipment, support staff and site/side marked as required.  Indications: Airway Protection, Sedation, Status Epilepticus  Intubation method: Direct Laryngoscopy   Preoxygenation: BVM  Sedatives: 10 mg Etomidate, 40 mg of propofol  Paralytic: 100 mg Succinylcholine  Tube Size: 8 cuffed  Post-procedure assessment: chest rise and ETCO2 monitor Breath sounds: equal and absent over the epigastrium Tube secured with: ETT holder Chest x-ray interpreted by radiologist and me.  Chest x-ray findings: endotracheal tube in appropriate position  Patient tolerated the procedure well with no immediate complications.   CRITICAL CARE Performed by:  Johnna Acosta Total critical care time: 35 Critical care time was exclusive of separately billable procedures and treating other patients. Critical care was necessary to treat or prevent imminent or life-threatening deterioration. Critical care was time spent personally by me on the following activities: development of  treatment plan with patient and/or surrogate as well as nursing, discussions with consultants, evaluation of patient's response to treatment, examination of patient, obtaining history from patient or surrogate, ordering and performing treatments and interventions, ordering and review of laboratory studies, ordering and review of radiographic studies, pulse oximetry and re-evaluation of patient's condition.   Meds given in ED:  Medications  lidocaine (cardiac) 100 mg/59ml (XYLOCAINE) 20 MG/ML injection 2% (not administered)  succinylcholine (ANECTINE) 20 MG/ML injection (not administered)  rocuronium (ZEMURON) 50 MG/5ML injection (not administered)  etomidate (AMIDATE) 2 MG/ML injection (not administered)  propofol (DIPRIVAN) 10 mg/mL bolus/IV push (not administered)  etomidate (AMIDATE) injection (10 mg Intravenous Given 05/20/14 1710)  succinylcholine (ANECTINE) injection (100 mg Intravenous Given 05/20/14 1711)  propofol (DIPRIVAN) 10 mg/ml infusion (not administered)  propofol (DIPRIVAN) 10 mg/mL bolus/IV push (40 mg Intravenous Restarted 05/20/14 1718)  propofol (DIPRIVAN) 10 mg/ml infusion (40 mcg/kg/min  127 kg (Order-Specific) Intravenous New Bag/Given 05/20/14 1725)  LORazepam (ATIVAN) injection 0.5 mg (0.5 mg Intravenous Given 05/20/14 1622)  levETIRAcetam (KEPPRA) IVPB 1000 mg/100 mL premix (1,000 mg Intravenous Given 05/20/14 1656)  LORazepam (ATIVAN) 2 MG/ML injection (2 mg  Given 05/20/14 1700)      Johnna Acosta, MD 05/20/14 [phone]

## 2014-05-20 NOTE — ED Notes (Signed)
Pt seizing every few minutes. Lasting less than 90s. Has had 5 seizures since arrival. MD aware.

## 2014-05-20 NOTE — Consult Note (Signed)
Consult Reason for Consult:seizures Referring Physician: Dr Sabra Heck  CC: seizures  HPI: [name] is an 64 y.o. male with a complicated past medical history including history of epilepsy presenting for recurrent seizures. Over the last several weeks the patient has been having episodes of recurrent seizures where his head turns to the left, his teeth chatter and he is partially unresponsive though he does appear to be following some commands during these episodes. These episodes last around 10-20 seconds. They last several minutes then resolve spontaneously and they have been occuring more frequently. He was seen several days ago and was found to have a possible pneumonia for which he was prescribed an antibiotic and was sent home. He initially did well but has had intermittent and increasing in frequency episodes of these episodes and today has had more than 10 episodes. He does not appear to return to baseline after the events. Per his wife, he has not been at baseline since Tuesday.  His home AED include dilantin 400mg  qhs and keppra 1500mg  TID. He was also recently given ativan prn to use for breakthrough seizures. His wife notes this has not helped.   Past Medical History  Diagnosis Date  . Coronary artery disease   . Depression   . Peripheral vascular disease   . Seizures   . Deep vein thrombophlebitis of left leg   . Deep vein thrombosis of right lower extremity   . Psoriasis   . Arthritis   . DVT (deep venous thrombosis)     multiple times  . Weakness of both legs   . Peripheral neuropathy   . Venous stasis   . Hyperplasia of prostate   . BPH (benign prostatic hyperplasia)   . Hyperlipidemia     Past Surgical History  Procedure Laterality Date  . Colonoscopy  03/26/2012    Procedure: COLONOSCOPY;  Surgeon: Beryle Beams, MD;  Location: [facility];  Service: Endoscopy;  Laterality: N/A;  . Tonsillectomy      as child  . Flexible sigmoidoscopy N/A 12/09/2013   Procedure: FLEXIBLE SIGMOIDOSCOPY;  Surgeon: Beryle Beams, MD;  Location: [facility];  Service: Endoscopy;  Laterality: N/A;  . Laparotomy N/A 12/25/2013    Procedure: EXPLORATORY LAPAROTOMY/Transverse and right coloectomy/ end  ileostomy and mucus  fistula;  Surgeon: Ralene Ok, MD;  Location: [place];  Service: General;  Laterality: N/A;  . Ankle fracture surgery  09/04/13  . Colostomy  12-25-13    Ruptured colon    Family History  Problem Relation Age of Onset  . Vision loss Father   . Hypertension Father   . Diabetes Father   . Heart disease Father   . Cancer Father     colon  . Diabetes Mother   . Hypertension Mother   . Heart disease Mother   . Dementia Mother   . Cancer Mother     breast ca  . Diabetes Sister   . Hyperlipidemia Sister   . Hypertension Sister   . Diabetes Brother   . Kidney disease Brother   . Heart disease Brother     Social History:  reports that he quit smoking about 9 months ago. His smoking use included Pipe. He does not have any smokeless tobacco history on file. He reports that he does not drink alcohol or use illicit drugs.  Allergies  Allergen Reactions  . Penicillins     Mother and brother have had severe allergic reactions, no personal reaction    Medications: I  have reviewed the patient's current medications.  CT head imaging reviewed and no acute process noted.   ROS: Out of a complete 14 system review, the patient complains of only the following symptoms, and all other reviewed systems are negative. Unable to obtain due to mental status  Physical Examination: Filed Vitals:   05/20/14 1544  BP: 130/78  Pulse: 70  Temp: 98.4 F (36.9 C)  Resp: 16   Physical Exam  Constitutional: mild distress, obtunded appearing Psych: Affect appropriate to situation Eyes: No scleral injection HENT: No OP obstrucion Head: Normocephalic.  Cardiovascular: Normal rate and regular rhythm.  Respiratory: slow labored breathing GI: Soft.  Bowel sounds are normal. No distension. There is no tenderness.  Skin: WDI  Neurologic Examination During my exam he was noted to have multiple seizure episodes consisting of turning head to the left, eye lid blinking and teeth chattering. Unresponsive during event.  Mental Status: Obtunded, eyes closed, no verbal output, moans to noxious stimuli, not following commands Cranial Nerves: II: unable to visualize fundi, pupils equal, round, reactive to light  III,IV, VI: ptosis not present, eyes midline, no gaze deviation or nystagmus V,VII: smile symmetric,  VIII: unable to assess IX,X: gag reflex present XI: unable to assess XII: unable to assess Motor: No spontaneous movement of extremities. Withdrawals to noxious stimuli in RUE and bilateral LE. No withdrawal in LUE Sensory: see above motor exam Deep Tendon Reflexes: 2+ and symmetric throughout Plantars: Right: downgoing   Left: downgoing Cerebellar: Unable to test Gait: unable to test  Laboratory Studies:   Basic Metabolic Panel:  Recent Labs Lab 05/17/14 0137 05/17/14 0206  NA 139 140  K 4.3 4.2  CL 107 103  CO2 24  --   GLUCOSE 108* 112*  BUN 10 13  CREATININE 0.66 0.70  CALCIUM 8.4  --   MG 2.0  --     Liver Function Tests:  Recent Labs Lab 05/17/14 0137  AST 24  ALT 35  ALKPHOS 143*  BILITOT 0.4  PROT 5.8*  ALBUMIN 3.1*    Recent Labs Lab 05/17/14 0137  LIPASE 41   No results for input(s): AMMONIA in the last 168 hours.  CBC:  Recent Labs Lab 05/17/14 0137 05/17/14 0206  WBC 6.3  --   NEUTROABS 3.8  --   HGB 15.0 16.3  HCT 45.7 48.0  MCV 94.0  --   PLT 170  --     Cardiac Enzymes:  Recent Labs Lab 05/17/14 0137  CKTOTAL 38    BNP: Invalid input(s): POCBNP  CBG:  Recent Labs Lab 05/17/14 0138  GLUCAP 106*    Microbiology: Results for orders placed or performed during the hospital encounter of 01/29/14  Culture, blood (routine x 2)     Status: None   Collection  Time: 01/29/14 11:27 PM  Result Value Ref Range Status   Specimen Description BLOOD RIGHT ANTECUBITAL  Final   Special Requests BOTTLES DRAWN AEROBIC AND ANAEROBIC 3CC  Final   Culture  Setup Time   Final    01/30/2014 09:41 Performed at [place]   Final    NO GROWTH 5 DAYS Performed at [facility]   Report Status 02/05/2014 FINAL  Final  Culture, blood (routine x 2)     Status: None   Collection Time: 01/29/14 11:27 PM  Result Value Ref Range Status   Specimen Description BLOOD LEFT ANTECUBITAL  Final   Special Requests BOTTLES DRAWN AEROBIC AND ANAEROBIC 5CC  Final   Culture  Setup Time   Final    01/30/2014 09:41 Performed at [place]   Final    NO GROWTH 5 DAYS Performed at [facility]   Report Status 02/05/2014 FINAL  Final  Urine culture     Status: None   Collection Time: 01/30/14 12:27 AM  Result Value Ref Range Status   Specimen Description URINE, CLEAN CATCH  Final   Special Requests Normal  Final   Culture  Setup Time   Final    01/30/2014 09:55 Performed at [place]   Final    >=100,000 COLONIES/ML Performed at [facility]   Culture   Final    PROTEUS MIRABILIS Performed at [facility]   Report Status 02/01/2014 FINAL  Final   Organism ID, Bacteria PROTEUS MIRABILIS  Final      Susceptibility   Proteus mirabilis - MIC*    AMPICILLIN >=32 RESISTANT Resistant     CEFAZOLIN 8 SENSITIVE Sensitive     CEFTRIAXONE <=1 SENSITIVE Sensitive     CIPROFLOXACIN >=4 RESISTANT Resistant     GENTAMICIN >=16 RESISTANT Resistant     LEVOFLOXACIN >=8 RESISTANT Resistant     NITROFURANTOIN 128 RESISTANT Resistant     TOBRAMYCIN 4 SENSITIVE Sensitive     TRIMETH/SULFA >=320 RESISTANT Resistant     PIP/TAZO <=4 SENSITIVE Sensitive     * PROTEUS MIRABILIS  MRSA PCR Screening     Status: None   Collection Time: 01/30/14  6:44 AM  Result Value Ref Range Status   MRSA  by PCR NEGATIVE NEGATIVE Final    Comment:        The GeneXpert MRSA Assay (FDA approved for NASAL specimens only), is one component of a comprehensive MRSA colonization surveillance program. It is not intended to diagnose MRSA infection nor to guide or monitor treatment for MRSA infections.  Clostridium Difficile by PCR     Status: None   Collection Time: 01/30/14 12:31 PM  Result Value Ref Range Status   C difficile by pcr NEGATIVE NEGATIVE Final    Comment: Performed at [facility]    Coagulation Studies: No results for input(s): LABPROT, INR in the last 72 hours.  Urinalysis:  Recent Labs Lab 05/17/14 0235  COLORURINE YELLOW  LABSPEC 1.021  PHURINE 6.5  GLUCOSEU NEGATIVE  HGBUR NEGATIVE  BILIRUBINUR NEGATIVE  KETONESUR NEGATIVE  PROTEINUR NEGATIVE  UROBILINOGEN 0.2  NITRITE NEGATIVE  LEUKOCYTESUR NEGATIVE    Lipid Panel:     Component Value Date/Time   CHOL 136 10/17/2013   TRIG 65 10/17/2013   HDL 45 10/17/2013   LDLCALC 78 10/17/2013    HgbA1C:  Lab Results  Component Value Date   HGBA1C 5.1 01/30/2014    Urine Drug Screen:     Component Value Date/Time   LABOPIA NONE DETECTED 05/17/2014 0235   COCAINSCRNUR NONE DETECTED 05/17/2014 0235   LABBENZ NONE DETECTED 05/17/2014 0235   AMPHETMU NONE DETECTED 05/17/2014 0235   THCU NONE DETECTED 05/17/2014 0235   LABBARB NONE DETECTED 05/17/2014 0235    Alcohol Level:  Recent Labs Lab 05/17/14 [place] <5    Other results:  Imaging: No results found.   Assessment/Plan:  63y/o gentleman with known seizure disorder and recent diagnosis of PNA presenting in status epilepticus. Per patients wife he was last at his baseline 4 days ago. Since then he has had frequent recurrent seizures and never fully  returned to baseline. She called [facility] today and talked to the on-call neurologist, Dr Jannifer Franklin, who directed the patient to the ED. His wife notes these episodes are not his typical seizures.  Unclear etiology though suspect PNA and recent multiple medical problems may have contributed to this. There is also a question of compliance with medication as his recent dilantin level on 1/06 was <2.5.   In the ED he was given a total of 3mg  ativan and loaded with 1000mg  of keppra. Due to respiratory decline the decision was made to intubate for airway protection. Currently sedated with propofol. TOn-call EEG tech was called for a stat EEG.   -stat EEG, may consider leaving on continuous EEG overnight to closely monitor -Dilantin level 7.2. Will load with 1000mg  fosphenytoin -continue keppra 1500mg  TID and dilantin 400mg  qhs.  -check UA, CMP, magnesium, UDS. -check CXR. Currently on azithromycin for PNA -PCCM consultation -will follow closely    This patient is critically ill and at significant risk of neurological worsening, death and care requires constant monitoring of vital signs, hemodynamics,respiratory and cardiac monitoring,review of multiple databases, neurological assessment, discussion with family, other specialists and medical decision making of high complexity. I spent 55 inutes of neurocritical care time in the care of this patient.    Jim Like, DO [facility] [phone]  If 7pm- 7am, please page neurology on call as listed in AMION. 05/20/2014, 4:39 PM

## 2014-05-20 NOTE — H&P (Signed)
PULMONARY / CRITICAL CARE MEDICINE   Name: [name] MRN: [id number] DOB: 09-17-1950    ADMISSION DATE:  05/20/2014  CHIEF COMPLAINT:  Seizures, Acute respiratory failure  INITIAL PRESENTATION:   STUDIES:  Head CT 1/9 >> no acute findings  SIGNIFICANT EVENTS: 1/6 > in the ED for focal seizures, Keppra level undetectable. Was started on dilantin, treated for a possible PNA and sent home.  1/9 > returned to ED with worsening focal seizures, received serial doses ativan, became obtunded  > intubated.   HISTORY OF PRESENT ILLNESS:  64 yo man with MMP that include seizure disorder, DVT's, CAD, transvere and 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 Having focal seizures with worsening control over the last 4 days. He was seen in ED 1/6 and Keppra changed to dilantin. He was also treated for PNA at that visit. Unfortunately he has continued to have progressive seizures, to point of persistent post-ictal state without recovery. He was brought to the ED 1/9 and given ativan aggressively to achieve control. He became obtunded and required intubation for airway protection. Unclear whether he was taking the dilantin or not. PCCM will admit.   PAST MEDICAL HISTORY :  Past Medical History  Diagnosis Date  . Coronary artery disease   . Depression   . Peripheral vascular disease   . Seizures   . Deep vein thrombophlebitis of left leg   . Deep vein thrombosis of right lower extremity   . Psoriasis   . Arthritis   . DVT (deep venous thrombosis)     multiple times  . Weakness of both legs   . Peripheral neuropathy   . Venous stasis   . Hyperplasia of prostate   . BPH (benign prostatic hyperplasia)   . Hyperlipidemia     Prior to Admission medications   Medication Sig Start Date End Date Taking? Authorizing Provider  acetaminophen (TYLENOL) 500 MG tablet Take 1 tablet (500 mg total) by mouth 2 (two) times daily as needed for mild pain or fever. 02/02/14   Modena Jansky,  MD  azithromycin (ZITHROMAX) 250 MG tablet Take 1 tablet (250 mg total) by mouth daily. 05/17/14   Everlene Balls, MD  Calcium Carb-Cholecalciferol (CALCIUM-VITAMIN D) 600-400 MG-UNIT TABS Take 1 tablet by mouth daily. For calcium supplement    Historical Provider, MD  DULoxetine (CYMBALTA) 60 MG capsule Take 60 mg by mouth daily.    Historical Provider, MD  enoxaparin (LOVENOX) 120 MG/0.8ML injection Inject 0.8 mLs (120 mg total) into the skin every 12 (twelve) hours. Discontinue once INR is greater than 2. Patient not taking: Reported on 05/17/2014 02/02/14   Modena Jansky, MD  gabapentin (NEURONTIN) 600 MG tablet Take 1 tablet (600 mg total) by mouth 3 (three) times daily. Patient taking differently: Take 600 mg by mouth 4 (four) times daily -  before meals and at bedtime.  03/31/14   Asencion Partridge Dohmeier, MD  Gabapentin, Once-Daily, 600 MG TABS G[place]ise 600 mg in AM and 1200 mg in PM. Po Patient not taking: Reported on 05/17/2014 03/31/14   [name], MD  HYDROcodone-acetaminophen (NORCO/VICODIN) 5-325 MG per tablet Take 1-2 tablets by mouth every 6 (six) hours as needed for moderate pain or severe pain. Patient not taking: Reported on 05/17/2014 02/02/14   Modena Jansky, MD  levETIRAcetam (KEPPRA) 750 MG tablet Take 2 tablets (1,500 mg total) by mouth 3 (three) times daily. For seizures 03/31/14   [name], MD  LORazepam (ATIVAN) 1 MG tablet  Use 1 mg tab to break focal seizure activity. As needed, do not exceed 2 mg a day. 05/19/14   Asencion Partridge Dohmeier, MD  Multiple Vitamin (MULTIVITAMIN WITH MINERALS) TABS Take 1 tablet by mouth daily.    Historical Provider, MD  phenytoin (DILANTIN) 100 MG ER capsule Take 1 capsule (100 mg total) by mouth 3 (three) times daily. Give 1 capsule by mouth three times daily on Monday, Wednesday, and Friday. 03/31/14   Asencion Partridge Dohmeier, MD  phenytoin (DILANTIN) 100 MG ER capsule Take 1 capsule (100 mg total) by mouth at bedtime. To be taken four times a day on Sunday,  Tuesday, Thursday, and Saturday 05/17/14   [name], MD  pravastatin (PRAVACHOL) 20 MG tablet Take 20 mg by mouth daily. For HLD    Historical Provider, MD  tamsulosin (FLOMAX) 0.4 MG CAPS capsule Take 0.4 mg by mouth daily. For BPH 03/21/13   Historical Provider, MD  [place] Name: Lewanda Rife Powder.  For use around colostomy    Historical Provider, MD  vitamin B-12 (CYANOCOBALAMIN) 1000 MCG tablet Take 1,000 mcg by mouth daily. For supplement    Historical Provider, MD  warfarin (COUMADIN) 1 MG tablet Take 2 mg by mouth daily.  03/15/14   Historical Provider, MD  warfarin (COUMADIN) 5 MG tablet Take 7.5 mg by mouth daily.  03/15/14   Historical Provider, MD   Allergies  Allergen Reactions  . Penicillins     Mother and brother have had severe allergic reactions, no personal reaction    FAMILY HISTORY:  indicated that his mother is deceased. He indicated that his father is deceased. He indicated that his sister is alive. He indicated that his brother is deceased.  SOCIAL HISTORY:  reports that he quit smoking about 9 months ago. His smoking use included Pipe. He does not have any smokeless tobacco history on file. He reports that he does not drink alcohol or use illicit drugs.  REVIEW OF SYSTEMS: unable to obtain  SUBJECTIVE:  Sedated and ventilated, unable to obtain  VITAL SIGNS: Temp:  [98.4 F (36.9 C)] 98.4 F (36.9 C) (01/09 1544) Pulse Rate:  [70-104] 82 (01/09 1745) Resp:  [14-25] 16 (01/09 1745) BP: (110-216)/(78-116) 110/90 mmHg (01/09 1745) SpO2:  [79 %-100 %] 93 % (01/09 1745) FiO2 (%):  [100 %] 100 % (01/09 1731) HEMODYNAMICS:   VENTILATOR SETTINGS: Vent Mode:  [-]  FiO2 (%):  [100 %] 100 % Set Rate:  [16 bmp] 16 bmp Vt Set:  [277 mL] 670 mL PEEP:  [5 cmH20] 5 cmH20 Plateau Pressure:  [15 cmH20] 15 cmH20 INTAKE / OUTPUT: No intake or output data in the 24 hours ending 05/20/14 1754  PHYSICAL EXAMINATION: General:  Obese man, sedated and  ventilated Neuro:  Sedated, withdrawal to pain, L LE with some twitching HEENT:  ETT in place, PERRL,  Cardiovascular:  Regular, distant, no M Lungs:  Clear bilaterally, no wheeze Abdomen:  Obese, ileostomy in place with an area of skin breakdown, no evidence cellulitis Musculoskeletal:  No edema Skin:  No rash  LABS:  CBC  Recent Labs Lab 05/17/14 0137 05/17/14 0206 05/20/14 1626  WBC 6.3  --  6.0  HGB 15.0 16.3 15.4  HCT 45.7 48.0 46.3  PLT 170  --  149*   Coag's  Recent Labs Lab 05/17/14 0137 05/20/14 1626  INR 2.15* 2.31*   BMET  Recent Labs Lab 05/17/14 0137 05/17/14 0206 05/20/14 1626  NA 139 140 136  K 4.3  4.2 4.3  CL 107 103 104  CO2 24  --  28  BUN 10 13 9   CREATININE 0.66 0.70 0.55  GLUCOSE 108* 112* 101*   Electrolytes  Recent Labs Lab 05/17/14 0137 05/20/14 1626  CALCIUM 8.4 8.7  MG 2.0  --    Sepsis Markers  Recent Labs Lab 05/17/14 0200 05/20/14 1734  LATICACIDVEN 1.14 1.17   ABG No results for input(s): PHART, PCO2ART, PO2ART in the last 168 hours. Liver Enzymes  Recent Labs Lab 05/17/14 0137 05/20/14 1626  AST 24 20  ALT 35 30  ALKPHOS 143* 151*  BILITOT 0.4 0.3  ALBUMIN 3.1* 3.2*   Cardiac Enzymes No results for input(s): TROPONINI, PROBNP in the last 168 hours. Glucose  Recent Labs Lab 05/17/14 0138  GLUCAP 106*    Imaging No results found.   ASSESSMENT / PLAN:  PULMONARY OETT 1/9 >>  A: Acute respiratory failure due to suppressed MS Possible RLL CAP, was treated outpt with azithromycin P:   - vent orders entered - CXR pending - abx for CAP as below pending further review of his imaging  NEUROLOGIC A:  Seizure disorder with decompensation, ? Due to infection, ? 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 CAP. Started on azithro PO 1/6 P:   BCx2 1/9 >>  UC 1/9 >>  Resp cx 1/9 >>   levaquin 1/9 >>   No leukocytosis. Low threshold to d/c abx depending on f/u CXR's  ENDOCRINE A:  Hyperglycemia  P:   - follow glu on BMP. If elevated then order POC CBG's   FAMILY  - Updates:   - Inter-disciplinary family meet or Palliative Care meeting due by:  05/27/14    TODAY'S SUMMARY:  Refractory seizures requiring sedation and intubation 1/9.   Independent CC time 60 minutes   Baltazar Apo, MD, PhD 05/20/2014, 6:29 PM [facility] [phone] or if no answer [phone]

## 2014-05-20 NOTE — Progress Notes (Signed)
ANTICOAGULATION CONSULT NOTE - Initial Consult  Pharmacy Consult for Coumadin Indication: DVT history  Allergies  Allergen Reactions  . Penicillins     Mother and brother have had severe allergic reactions, no personal reaction    Patient Measurements: Height: 6\' 3"  (190.5 cm) IBW/kg (Calculated) : 84.5  Vital Signs: Temp: 98.4 F (36.9 C) (01/09 1544) Temp Source: Oral (01/09 1544) BP: 113/81 mmHg (01/09 1930) Pulse Rate: 62 (01/09 1930)  Labs:  Recent Labs  05/20/14 1626  HGB 15.4  HCT 46.3  PLT 149*  LABPROT 25.6*  INR 2.31*  CREATININE 0.55    CrCl cannot be calculated (Unknown ideal weight.).   Medical History: Past Medical History  Diagnosis Date  . Coronary artery disease   . Depression   . Peripheral vascular disease   . Seizures   . Deep vein thrombophlebitis of left leg   . Deep vein thrombosis of right lower extremity   . Psoriasis   . Arthritis   . DVT (deep venous thrombosis)     multiple times  . Weakness of both legs   . Peripheral neuropathy   . Venous stasis   . Hyperplasia of prostate   . BPH (benign prostatic hyperplasia)   . Hyperlipidemia     Assessment: 64 year old male admitted with seizures and acute respiratory failure on Coumadin prior to admission for DVT history.  INR therapeutic on home dose of 7 mg daily (last dose 1/8)  Goal of Therapy:  INR = 2 to 3 Monitor platelets by anticoagulation protocol: Yes   Plan:  Coumadin 7 mg po daily at 1800 pm Stopped sq heparin Daily INR   Thank you. Anette Guarneri, PharmD [phone]  05/20/2014,8:36 PM

## 2014-05-20 NOTE — Telephone Encounter (Signed)
I called the wife. The patient has had frequent nonconvulsive seizures over the last 3 days. He is being treated for pneumonia and his dilantin level was low at 2.5 in the ER on 05/17/14. He is not recovering normal mental status between seizures, and appears to be in an ictal stupor. He is in status epilepticus. I have told the wife to go to the ER for evaluation. His normal seizure frequency is one event every 2 months or so. I have contacted the ER to let them know he is coming.

## 2014-05-20 NOTE — Progress Notes (Signed)
Chaplain paged to visit with pt and sister.   Sister noted that they are the only ones left of their siblings. Older brother in addition to a parent passed within a couple years ago. Very teary eyed, expressed some fear in everything happening so suddenly and unforeseeable.   Sister expressed that she has been praying for strength believing that "I know that God has a purpose with all of this, I wish I knew".   Chaplain provided emotional support and listened empathically to her concerns, fears, anxieties as she attempted to make a meaningful interpretation of recent events.   Delford Field, Chaplain 05/20/2014

## 2014-05-20 NOTE — ED Notes (Signed)
Per EMS: Pt from home with wife. Hx of epilepsy,  Began having seizures Tuesday, family has been in contact with neurologist who referred them to ER. Seizures are focal, to face. Pt able to follow commands during. Reproted that lorazapam family not helping seizures. Neurologist increasing dilantin dosage, no effect. Left sided facial droop and slurred speech is baseline per EMS.

## 2014-05-21 ENCOUNTER — Inpatient Hospital Stay (HOSPITAL_COMMUNITY): Payer: Medicare Other

## 2014-05-21 DIAGNOSIS — R569 Unspecified convulsions: Secondary | ICD-10-CM

## 2014-05-21 LAB — GLUCOSE, CAPILLARY
GLUCOSE-CAPILLARY: 89 mg/dL (ref 70–99)
GLUCOSE-CAPILLARY: 97 mg/dL (ref 70–99)
Glucose-Capillary: 72 mg/dL (ref 70–99)
Glucose-Capillary: 88 mg/dL (ref 70–99)

## 2014-05-21 LAB — BASIC METABOLIC PANEL
Anion gap: 11 (ref 5–15)
BUN: 8 mg/dL (ref 6–23)
CALCIUM: 8.7 mg/dL (ref 8.4–10.5)
CO2: 25 mmol/L (ref 19–32)
CREATININE: 0.6 mg/dL (ref 0.50–1.35)
Chloride: 103 mEq/L (ref 96–112)
GFR calc non Af Amer: >90 mL/min (ref >90)
Glucose, Bld: 85 mg/dL (ref 70–99)
POTASSIUM: 3.9 mmol/L (ref 3.5–5.1)
SODIUM: 139 mmol/L (ref 135–145)

## 2014-05-21 LAB — CBC
HCT: 46.8 % (ref 39.0–52.0)
Hemoglobin: 16.1 g/dL (ref 13.0–17.0)
MCH: 31.6 pg (ref 26.0–34.0)
MCHC: 34.4 g/dL (ref 30.0–36.0)
MCV: 91.8 fL (ref 78.0–100.0)
Platelets: 130 10*3/uL — ABNORMAL LOW (ref 150–400)
RBC: 5.1 MIL/uL (ref 4.22–5.81)
RDW: 13.6 % (ref 11.5–15.5)
WBC: 7.5 10*3/uL (ref 4.0–10.5)

## 2014-05-21 LAB — TROPONIN I

## 2014-05-21 LAB — PHOSPHORUS: Phosphorus: 3.6 mg/dL (ref 2.3–4.6)

## 2014-05-21 LAB — MAGNESIUM: Magnesium: 1.9 mg/dL (ref 1.5–2.5)

## 2014-05-21 LAB — PHENYTOIN LEVEL, TOTAL: Phenytoin Lvl: 13.2 ug/mL (ref 10.0–20.0)

## 2014-05-21 LAB — PROTIME-INR
INR: 2.69 — ABNORMAL HIGH (ref 0.00–1.49)
Prothrombin Time: 28.8 seconds — ABNORMAL HIGH (ref 11.6–15.2)

## 2014-05-21 MED ORDER — WARFARIN SODIUM 3 MG PO TABS
3.5000 mg | ORAL_TABLET | Freq: Once | ORAL | Status: AC
  Administered 2014-05-21: 3.5 mg
  Filled 2014-05-21: qty 1

## 2014-05-21 MED ORDER — PNEUMOCOCCAL VAC POLYVALENT 25 MCG/0.5ML IJ INJ
0.5000 mL | INJECTION | INTRAMUSCULAR | Status: AC
  Administered 2014-05-22: 0.5 mL via INTRAMUSCULAR
  Filled 2014-05-21: qty 0.5

## 2014-05-21 MED ORDER — PRAVASTATIN SODIUM 40 MG PO TABS
20.0000 mg | ORAL_TABLET | Freq: Every day | ORAL | Status: DC
  Administered 2014-05-21 – 2014-05-24 (×4): 20 mg via ORAL
  Filled 2014-05-21 (×3): qty 1
  Filled 2014-05-21: qty 0.5
  Filled 2014-05-21: qty 1

## 2014-05-21 MED ORDER — FENTANYL CITRATE 0.05 MG/ML IJ SOLN: 12.5000 ug | INTRAMUSCULAR | Status: DC | PRN

## 2014-05-21 NOTE — Progress Notes (Signed)
PULMONARY / CRITICAL CARE MEDICINE   Name: [name] MRN: [id number] DOB: [birth year]    ADMISSION DATE:  05/20/2014  CHIEF COMPLAINT:  Seizures, Acute respiratory failure  INITIAL PRESENTATION:   STUDIES:  Head CT 1/9 >> no acute findings CXR 1/9>> patchy multifocal interstitial and airspace opacities EEG 1/10>> left temporal sharp activity consistent with seizure focus, no active seizure CXR 1/10>> Sm R pleural effusion  SIGNIFICANT EVENTS: 1/6 > in the ED for focal seizures, Keppra level undetectable. Was started on dilantin, treated for a possible PNA and sent home.  1/9 > returned to ED with worsening focal seizures, received serial doses ativan, became obtunded  > intubated.   SUBJECTIVE:  Sedated and ventilated. Moves extremities in response to noxious stimuli.   VITAL SIGNS: Temp:  [97.4 F (36.3 C)-98.4 F (36.9 C)] 97.5 F (36.4 C) (01/10 0349) Pulse Rate:  [51-104] 55 (01/10 0500) Resp:  [12-25] 12 (01/10 0500) BP: (102-216)/(66-116) 138/70 mmHg (01/10 0500) SpO2:  [79 %-100 %] 100 % (01/10 0500) FiO2 (%):  [40 %-100 %] 40 % (01/10 0500) Weight:  [130.3 kg (287 lb 4.2 oz)] 130.3 kg (287 lb 4.2 oz) (01/09 2000) HEMODYNAMICS:   VENTILATOR SETTINGS: Vent Mode:  [-] PRVC FiO2 (%):  [40 %-100 %] 40 % Set Rate:  [12 bmp-16 bmp] 12 bmp Vt Set:  [600 mL-670 mL] 600 mL PEEP:  [5 cmH20] 5 cmH20 Plateau Pressure:  [14 cmH20-18 cmH20] 14 cmH20 INTAKE / OUTPUT:  Intake/Output Summary (Last 24 hours) at 05/21/14 0711 Last data filed at 05/21/14 0500  Gross per 24 hour  Intake 647.26 ml  Output    995 ml  Net -347.74 ml    PHYSICAL EXAMINATION: General:  Obese man, sedated and ventilated Neuro:  Sedated, withdrawal to pain HEENT:  ETT in place, PERRL Cardiovascular: RRR, no m/g/r Lungs: CTA bilaterally, no wheeze Abdomen:  Obese, ileostomy in place with an area of skin breakdown, no evidence cellulitis Musculoskeletal:  No edema Skin: LLE has darkened  pigmentation from ankle to mid leg consistent with chronic venous skin changes  LABS:  CBC  Recent Labs Lab 05/17/14 0137 05/17/14 0206 05/20/14 1626  WBC 6.3  --  6.0  HGB 15.0 16.3 15.4  HCT 45.7 48.0 46.3  PLT 170  --  149*   Coag's  Recent Labs Lab 05/17/14 0137 05/20/14 1626 05/21/14 0550  INR 2.15* 2.31* 2.69*   BMET  Recent Labs Lab 05/17/14 0137 05/17/14 0206 05/20/14 1626  NA 139 140 136  K 4.3 4.2 4.3  CL 107 103 104  CO2 24  --  28  BUN 10 13 9   CREATININE 0.66 0.70 0.55  GLUCOSE 108* 112* 101*   Electrolytes  Recent Labs Lab 05/17/14 0137 05/20/14 1626 05/21/14 0550  CALCIUM 8.4 8.7  --   MG 2.0  --  1.9   Sepsis Markers  Recent Labs Lab 05/17/14 0200 05/20/14 1734  LATICACIDVEN 1.14 1.17   ABG  Recent Labs Lab 05/20/14 2031  PHART 7.488*  PCO2ART 31.2*  PO2ART 198.0*   Liver Enzymes  Recent Labs Lab 05/17/14 0137 05/20/14 1626  AST 24 20  ALT 35 30  ALKPHOS 143* 151*  BILITOT 0.4 0.3  ALBUMIN 3.1* 3.2*   Cardiac Enzymes No results for input(s): TROPONINI, PROBNP in the last 168 hours. Glucose  Recent Labs Lab 05/17/14 0138 05/20/14 2355 05/21/14 0350  GLUCAP 106* 97 89    Imaging Ct Head Wo Contrast  05/20/2014  CLINICAL DATA:  Seizures, patient had seizure in CT  EXAM: CT HEAD WITHOUT CONTRAST  TECHNIQUE: Contiguous axial images were obtained from the base of the skull through the vertex without intravenous contrast.  COMPARISON:  MRI brain 12140 for  FINDINGS: There is no evidence of mass effect, midline shift, or extra-axial fluid collections. There is no evidence of a space-occupying lesion or intracranial hemorrhage. There is no evidence of a cortical-based area of acute infarction. There is generalized cerebral atrophy. There is periventricular white matter low attenuation likely secondary to microangiopathy.  The ventricles and sulci are appropriate for the patient's age. The basal cisterns are patent.   Visualized portions of the orbits are unremarkable. Is near complete opacification of bilateral maxillary sinuses, ethmoid sinuses and frontal sinus cyst. There is mild mucosal thickening of the left sphenoid sinus with an air-fluid level.  The osseous structures are unremarkable.  IMPRESSION: 1. No acute intracranial pathology. 2. Pansinus disease.   Electronically Signed   By: Kathreen Devoid   On: 05/20/2014 17:23   Dg Chest Port 1 View  05/20/2014   CLINICAL DATA:  64 year old intubated male patient.  EXAM: PORTABLE CHEST - 1 VIEW  COMPARISON:  Chest x-ray 05/17/2014.  FINDINGS: Interval placement of endotracheal tube with tip 4.4 cm above the carina. Nasogastric tube in position, occult on the proximal stomach. Lung volumes are very low. Study is limited by lack of visualization of the lower left hemithorax. With these limitations in mind, there are patchy areas of interstitial prominence and ill-defined airspace disease in the lungs bilaterally, favored to reflect evolving multilobar bronchopneumonia, most apparent in the perihilar aspect of the right lung and in the left upper lobe. No definite pleural effusions. No evidence of pulmonary edema. Heart size is borderline enlarged. The patient is rotated to the left on today's exam, resulting in distortion of the mediastinal contours and reduced diagnostic sensitivity and specificity for mediastinal pathology.  IMPRESSION: 1. Support apparatus, as above. 2. Patchy multifocal interstitial and airspace opacities in lungs bilaterally, concerning for multilobar bronchopneumonia.   Electronically Signed   By: Vinnie Langton M.D.   On: 05/20/2014 19:07     ASSESSMENT / PLAN:  PULMONARY OETT 1/9 >>  A: Acute respiratory failure due to suppressed MS Possible RLL CAP, was treated outpt with azithromycin- no consolidation on CXR P:   - off vent, doing well - CXR today with no evidence of PNA. Can complete today's abx for 5 days tx for CAP.   NEUROLOGIC A:   Seizure disorder with decompensation, ? Due to infection, ? 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 CAP. Started on azithro PO 1/6 CXR 1/10 with no consolidation P:   BCx2 1/9 >>  UC 1/9 >>  Resp cx 1/9 >>   Levaquin 1/9 >>   Afebrile. No leukocytosis. Prescribed azithro on 1/6 for RLL PNA. Complete abx today for total of 5 day course.   ENDOCRINE A:  Hyperglycemia- resolved  P:   - follow glucose on bmets   FAMILY  - Updates: no family to update  - Inter-disciplinary family meet or Palliative Care meeting due by:  05/27/14   Albin Felling, MD Internal Medicine Resident, PGY-1   PCCM ATTENDING: I have reviewed pt's initial presentation, consultants notes and hospital database in detail.  The above assessment and plan was formulated under my direction.  Merton Border, MD;  PCCM service; Mobile [phone]

## 2014-05-21 NOTE — Procedures (Addendum)
ELECTROENCEPHALOGRAM REPORT   Patient: [name]       Room #: E45 EEG No. ID: [id number] Age: 64 y.o.        Sex: male Referring Physician: Byrum Report Date:  05/20/2014        Interpreting Physician: Alexis Goodell  History: [name] is an 64 y.o. male with a history of seizures presenting with a multiple days of frequent seizures   Medications:  Scheduled: . antiseptic oral rinse  7 mL Mouth Rinse QID  . chlorhexidine  15 mL Mouth Rinse BID  . DULoxetine  60 mg Oral Daily  . gabapentin  600 mg Per Tube TID  . levETIRAcetam  1,500 mg Intravenous 3 times per day  . levofloxacin (LEVAQUIN) IV  750 mg Intravenous Q24H  . pantoprazole sodium  40 mg Per Tube Daily  . phenytoin (DILANTIN) IV  400 mg Intravenous QHS  . pravastatin  20 mg Per Tube q1800  . warfarin  7 mg Oral q1800  . Warfarin - Pharmacist Dosing Inpatient   Does not apply q1800    Conditions of Recording:  This is a 16 channel EEG carried out with the patient in the intubated and sedated state.  Description:  The background activity consists of a low voltage, symmetrical, fairly well organized, diffusely distributed beta activity.  This si continuous throughout the recording.  Also noted is intermittent sharp activity in the right temporal region with phase reversal at T6.  The patient has some episodes of left foot twitching during the recording.  There is no correlate to this twitching activity.   There is no evidence of normal drowse or sleep.   Hyperventilation and intermittent photic stimulation were not performed.   IMPRESSION: This is an abnormal electroencephalogram.  Beta activity is the dominant rhythm and is consistent with a medication effect.  Also noted is left temporal sharp activity that is consistent with the patient's history of seizures.  There is no evidence of electroclinical or electrographic seizure activity.     Alexis Goodell, MD [facility] [phone] 05/20/2014,  8:42 PM

## 2014-05-21 NOTE — Progress Notes (Signed)
ANTICOAGULATION CONSULT NOTE - Follow Up Consult  Pharmacy Consult for Coumadin Indication: History of DVT  Allergies  Allergen Reactions  . Penicillins     Mother and brother have had severe allergic reactions, no personal reaction    Patient Measurements: Height: 6\' 3"  (190.5 cm) Weight: 287 lb 4.2 oz (130.3 kg) IBW/kg (Calculated) : 84.5 Vital Signs: Temp: 97.6 F (36.4 C) (01/10 0833) Temp Source: Oral (01/10 0833) BP: 133/77 mmHg (01/10 0835) Pulse Rate: 66 (01/10 0835) Labs:  Recent Labs  05/20/14 1626 05/21/14 0550  HGB 15.4 16.1  HCT 46.3 46.8  PLT 149* 130*  LABPROT 25.6* 28.8*  INR 2.31* 2.69*  CREATININE 0.55 0.60  TROPONINI  --  <0.03    Estimated Creatinine Clearance: 141 mL/min (by C-G formula based on Cr of 0.6).   Medications:  PTA- Coumadin 7 mg po daily  Assessment: 64 year old male admitted with seizures and acute respiratory failure on chronic Coumadin for history of DVT. Pharmacy consulted to dose Coumadin while inpatient.   INR on admission was therapeutic at 2.31. Today remains therapeutic at 2.69.  H/H is stable. Platelets 149 on admit >>130, will monitor.  No bleeding reported.   *Note- Patient was initiated on Levaquin therapy on 1/9 which may interact to elevate the INR.   Goal of Therapy:  INR 2-3 Monitor platelets by anticoagulation protocol: Yes   Plan:  Decrease Coumadin to 3.5mg  po x1 today. Follow-up PT/INR.   Thank you for this consult, Sloan Leiter, PharmD, BCPS Clinical Pharmacist [phone] 05/21/2014,9:46 AM

## 2014-05-21 NOTE — Procedures (Signed)
Extubation Procedure Note  Patient Details:   Name: [name] DOB: Oct 12, 1950 MRN: [id number]   Airway Documentation:     Evaluation  O2 sats: stable throughout Complications: No apparent complications Patient did tolerate procedure well. Bilateral Breath Sounds: Rhonchi Suctioning: Airway Yes   Patient extubated to 2L nasal cannula per MD order.  Positive cuff leak noted.  No evidence of stridor.  Sats currently 96%.  Vitals are stable.  Incentive spirometry performed x5 with achieved goal of 2500.  No apparent complications.  Alphia Moh N 05/21/2014, 11:32 AM

## 2014-05-21 NOTE — Progress Notes (Signed)
Subjective: No overnight events. EEG showed right temporal sharps but no seizure activity. Remains intubated on propofol.   Objective: Current vital signs: BP 138/70 mmHg  Pulse 55  Temp(Src) 97.5 F (36.4 C) (Oral)  Resp 12  Ht 6\' 3"  (1.905 m)  Wt 130.3 kg (287 lb 4.2 oz)  BMI 35.90 kg/m2  SpO2 100% Vital signs in last 24 hours: Temp:  [97.4 F (36.3 C)-98.4 F (36.9 C)] 97.5 F (36.4 C) (01/10 0349) Pulse Rate:  [51-104] 55 (01/10 0500) Resp:  [12-25] 12 (01/10 0500) BP: (102-216)/(66-116) 138/70 mmHg (01/10 0500) SpO2:  [79 %-100 %] 100 % (01/10 0500) FiO2 (%):  [40 %-100 %] 40 % (01/10 0500) Weight:  [130.3 kg (287 lb 4.2 oz)] 130.3 kg (287 lb 4.2 oz) (01/09 2000)  Intake/Output from previous day: 01/09 0701 - 01/10 0700 In: 647.3 [I.V.:397.3; IV Piggyback:250] Out: 995 [Urine:970; Stool:25] Intake/Output this shift:   Nutritional status: Diet NPO time specified  Neurologic Exam: Mental Status: Intubated, not following commands, no eye opening noted Cranial Nerves: II: pupils equal, round, reactive to light  III,IV, VI: ptosis not present, eyes midline, no gaze deviation or nystagmus V,VII: smile symmetric,  VIII: unable to assess IX,X: gag reflex present Motor: Noted spontaneous movement of extremities. Withdrawals to noxious stimuli in all extremities Sensory: see above motor exam  Lab Results: Basic Metabolic Panel:  Recent Labs Lab 05/17/14 0137 05/17/14 0206 05/20/14 1626 05/21/14 0550  NA 139 140 136  --   K 4.3 4.2 4.3  --   CL 107 103 104  --   CO2 24  --  28  --   GLUCOSE 108* 112* 101*  --   BUN 10 13 9   --   CREATININE 0.66 0.70 0.55  --   CALCIUM 8.4  --  8.7  --   MG 2.0  --   --  1.9    Liver Function Tests:  Recent Labs Lab 05/17/14 0137 05/20/14 1626  AST 24 20  ALT 35 30  ALKPHOS 143* 151*  BILITOT 0.4 0.3  PROT 5.8* 6.3  ALBUMIN 3.1* 3.2*    Recent Labs Lab 05/17/14 0137  LIPASE 41   No results for input(s):  AMMONIA in the last 168 hours.  CBC:  Recent Labs Lab 05/17/14 0137 05/17/14 0206 05/20/14 1626  WBC 6.3  --  6.0  NEUTROABS 3.8  --  4.3  HGB 15.0 16.3 15.4  HCT 45.7 48.0 46.3  MCV 94.0  --  92.8  PLT 170  --  149*    Cardiac Enzymes:  Recent Labs Lab 05/17/14 0137  CKTOTAL 38    Lipid Panel: No results for input(s): CHOL, TRIG, HDL, CHOLHDL, VLDL, LDLCALC in the last 168 hours.  CBG:  Recent Labs Lab 05/17/14 0138 05/20/14 2355 05/21/14 0350  GLUCAP 106* 97 40    Microbiology: Results for orders placed or performed during the hospital encounter of 05/20/14  MRSA PCR Screening     Status: None   Collection Time: 05/20/14  8:57 PM  Result Value Ref Range Status   MRSA by PCR NEGATIVE NEGATIVE Final    Comment:        The GeneXpert MRSA Assay (FDA approved for NASAL specimens only), is one component of a comprehensive MRSA colonization surveillance program. It is not intended to diagnose MRSA infection nor to guide or monitor treatment for MRSA infections.     Coagulation Studies:  Recent Labs  05/20/14 1626 05/21/14 0550  LABPROT 25.6* 28.8*  INR 2.31* 2.69*    Imaging: Ct Head Wo Contrast  05/20/2014   CLINICAL DATA:  Seizures, patient had seizure in CT  EXAM: CT HEAD WITHOUT CONTRAST  TECHNIQUE: Contiguous axial images were obtained from the base of the skull through the vertex without intravenous contrast.  COMPARISON:  MRI brain 12140 for  FINDINGS: There is no evidence of mass effect, midline shift, or extra-axial fluid collections. There is no evidence of a space-occupying lesion or intracranial hemorrhage. There is no evidence of a cortical-based area of acute infarction. There is generalized cerebral atrophy. There is periventricular white matter low attenuation likely secondary to microangiopathy.  The ventricles and sulci are appropriate for the patient's age. The basal cisterns are patent.  Visualized portions of the orbits are  unremarkable. Is near complete opacification of bilateral maxillary sinuses, ethmoid sinuses and frontal sinus cyst. There is mild mucosal thickening of the left sphenoid sinus with an air-fluid level.  The osseous structures are unremarkable.  IMPRESSION: 1. No acute intracranial pathology. 2. Pansinus disease.   Electronically Signed   By: Kathreen Devoid   On: 05/20/2014 17:23   Dg Chest Port 1 View  05/20/2014   CLINICAL DATA:  64 year old intubated male patient.  EXAM: PORTABLE CHEST - 1 VIEW  COMPARISON:  Chest x-ray 05/17/2014.  FINDINGS: Interval placement of endotracheal tube with tip 4.4 cm above the carina. Nasogastric tube in position, occult on the proximal stomach. Lung volumes are very low. Study is limited by lack of visualization of the lower left hemithorax. With these limitations in mind, there are patchy areas of interstitial prominence and ill-defined airspace disease in the lungs bilaterally, favored to reflect evolving multilobar bronchopneumonia, most apparent in the perihilar aspect of the right lung and in the left upper lobe. No definite pleural effusions. No evidence of pulmonary edema. Heart size is borderline enlarged. The patient is rotated to the left on today's exam, resulting in distortion of the mediastinal contours and reduced diagnostic sensitivity and specificity for mediastinal pathology.  IMPRESSION: 1. Support apparatus, as above. 2. Patchy multifocal interstitial and airspace opacities in lungs bilaterally, concerning for multilobar bronchopneumonia.   Electronically Signed   By: Vinnie Langton M.D.   On: 05/20/2014 19:07    Medications:  Scheduled: . antiseptic oral rinse  7 mL Mouth Rinse QID  . chlorhexidine  15 mL Mouth Rinse BID  . DULoxetine  60 mg Oral Daily  . gabapentin  600 mg Per Tube TID  . levETIRAcetam  1,500 mg Intravenous 3 times per day  . levofloxacin (LEVAQUIN) IV  750 mg Intravenous Q24H  . pantoprazole sodium  40 mg Per Tube Daily  .  phenytoin (DILANTIN) IV  400 mg Intravenous QHS  . pravastatin  20 mg Per Tube q1800  . warfarin  7 mg Oral q1800  . Warfarin - Pharmacist Dosing Inpatient   Does not apply q1800    Assessment/Plan: 63y/o gentleman with known seizure disorder and recent diagnosis of PNA presenting with frequent seizures without return to baseline consistent with status epilepticus. Unclear etiology though suspect PNA and recent multiple medical problems may have contributed to this. There is also a question of compliance with medication as his recent dilantin level on 1/06 was <2.5.   EEG shows no further seizure activity. Will plan for propofol and vent wean per CCM.  -continue keppra 1500mg  TID (home dose) and dilantin 400mg  qhs -will check repeat dilantin level on 1/11 -seizure precautions -propofol and  vent wean per CCM -if concern for ongoing seizure with wean will plan for repeat EEG -will continue to follow closely    LOS: 1 day   Jim Like, DO [facility] [phone]  If 7pm- 7am, please page neurology on call as listed in [place]. 05/21/2014  7:07 AM

## 2014-05-22 ENCOUNTER — Encounter (HOSPITAL_COMMUNITY): Payer: Self-pay | Admitting: *Deleted

## 2014-05-22 DIAGNOSIS — I4891 Unspecified atrial fibrillation: Secondary | ICD-10-CM

## 2014-05-22 DIAGNOSIS — G40901 Epilepsy, unspecified, not intractable, with status epilepticus: Secondary | ICD-10-CM | POA: Diagnosis not present

## 2014-05-22 LAB — CBC
HEMATOCRIT: 44.8 % (ref 39.0–52.0)
Hemoglobin: 15 g/dL (ref 13.0–17.0)
MCH: 31.7 pg (ref 26.0–34.0)
MCHC: 33.5 g/dL (ref 30.0–36.0)
MCV: 94.7 fL (ref 78.0–100.0)
Platelets: 148 10*3/uL — ABNORMAL LOW (ref 150–400)
RBC: 4.73 MIL/uL (ref 4.22–5.81)
RDW: 13.6 % (ref 11.5–15.5)
WBC: 7 10*3/uL (ref 4.0–10.5)

## 2014-05-22 LAB — PROTIME-INR
INR: 3.03 — AB (ref 0.00–1.49)
Prothrombin Time: 31.6 seconds — ABNORMAL HIGH (ref 11.6–15.2)

## 2014-05-22 LAB — PHENYTOIN LEVEL, TOTAL
Phenytoin Lvl: 13.8 ug/mL (ref 10.0–20.0)
Phenytoin Lvl: 13.3 ug/mL (ref 10.0–20.0)

## 2014-05-22 LAB — URINE CULTURE
Colony Count: NO GROWTH
Culture: NO GROWTH

## 2014-05-22 LAB — ALBUMIN: Albumin: 2.9 g/dL — ABNORMAL LOW (ref 3.5–5.2)

## 2014-05-22 LAB — TSH: TSH: 1.667 u[IU]/mL (ref 0.350–4.500)

## 2014-05-22 LAB — TROPONIN I

## 2014-05-22 MED ORDER — WARFARIN SODIUM 3 MG PO TABS
3.5000 mg | ORAL_TABLET | Freq: Once | ORAL | Status: AC
  Administered 2014-05-22: 3.5 mg via ORAL
  Filled 2014-05-22: qty 1

## 2014-05-22 MED ORDER — METOPROLOL TARTRATE 1 MG/ML IV SOLN
2.5000 mg | INTRAVENOUS | Status: DC | PRN
  Administered 2014-05-22: 2.5 mg via INTRAVENOUS
  Filled 2014-05-22 (×2): qty 5

## 2014-05-22 MED ORDER — DILTIAZEM HCL 30 MG PO TABS
30.0000 mg | ORAL_TABLET | Freq: Three times a day (TID) | ORAL | Status: DC
  Administered 2014-05-22 – 2014-05-24 (×5): 30 mg via ORAL
  Filled 2014-05-22 (×9): qty 1

## 2014-05-22 MED ORDER — METOPROLOL TARTRATE 1 MG/ML IV SOLN
2.5000 mg | Freq: Once | INTRAVENOUS | Status: AC
  Administered 2014-05-22: 2.5 mg via INTRAVENOUS
  Filled 2014-05-22: qty 5

## 2014-05-22 NOTE — Progress Notes (Signed)
PULMONARY / CRITICAL CARE MEDICINE   Name: [name] MRN: [id number] DOB: July 09, 1950    ADMISSION DATE:  05/20/2014  CHIEF COMPLAINT:  Seizures, Acute respiratory failure  INITIAL PRESENTATION:   STUDIES:  Head CT 1/9 >> no acute findings CXR 1/9>> patchy multifocal interstitial and airspace opacities EEG 1/10>> left temporal sharp activity consistent with seizure focus, no active seizure CXR 1/10>> Sm R pleural effusion  SIGNIFICANT EVENTS: 1/6 > in the ED for focal seizures, Keppra level undetectable. Was started on dilantin, treated for a possible PNA and sent home.  1/9 > returned to ED with worsening focal seizures, received serial doses ativan, became obtunded  > intubated.   SUBJECTIVE:  No seizures overnight. Doing well off vent. Awake, alert and oriented x 3. Eating fine.   VITAL SIGNS: Temp:  [97.4 F (36.3 C)-98.1 F (36.7 C)] 97.4 F (36.3 C) (01/11 0324) Pulse Rate:  [57-84] 64 (01/11 0600) Resp:  [10-16] 13 (01/11 0600) BP: (107-159)/(55-103) 110/59 mmHg (01/11 0600) SpO2:  [92 %-100 %] 95 % (01/11 0600) FiO2 (%):  [40 %] 40 % (01/10 0835) HEMODYNAMICS:   VENTILATOR SETTINGS: Vent Mode:  [-] PRVC FiO2 (%):  [40 %] 40 % Set Rate:  [12 bmp] 12 bmp Vt Set:  [600 mL] 600 mL PEEP:  [5 cmH20] 5 cmH20 Plateau Pressure:  [13 cmH20] 13 cmH20 INTAKE / OUTPUT:  Intake/Output Summary (Last 24 hours) at 05/22/14 0705 Last data filed at 05/22/14 9379  Gross per 24 hour  Intake 780.52 ml  Output   1677 ml  Net -896.48 ml    PHYSICAL EXAMINATION: General:  Obese man, resting in bed Neuro: alert and oriented x 3, no focal deficits HEENT: [place]/AT, EOMI, PERRL, mucus membranes moist Cardiovascular: RRR, no m/g/r Lungs: CTA bilaterally, no wheeze Abdomen:  Obese, ileostomy in place with an area of skin breakdown, no evidence cellulitis Musculoskeletal:  No edema Skin: LLE has darkened pigmentation from ankle to mid leg consistent with chronic venous skin  changes  LABS:  CBC  Recent Labs Lab 05/20/14 1626 05/21/14 0550 05/22/14 0257  WBC 6.0 7.5 7.0  HGB 15.4 16.1 15.0  HCT 46.3 46.8 44.8  PLT 149* 130* 148*   Coag's  Recent Labs Lab 05/20/14 1626 05/21/14 0550 05/22/14 0257  INR 2.31* 2.69* 3.03*   BMET  Recent Labs Lab 05/17/14 0137 05/17/14 0206 05/20/14 1626 05/21/14 0550  NA 139 140 136 139  K 4.3 4.2 4.3 3.9  CL 107 103 104 103  CO2 24  --  28 25  BUN 10 13 9 8   CREATININE 0.66 0.70 0.55 0.60  GLUCOSE 108* 112* 101* 85   Electrolytes  Recent Labs Lab 05/17/14 0137 05/20/14 1626 05/21/14 0550  CALCIUM 8.4 8.7 8.7  MG 2.0  --  1.9  PHOS  --   --  3.6   Sepsis Markers  Recent Labs Lab 05/17/14 0200 05/20/14 1734  LATICACIDVEN 1.14 1.17   ABG  Recent Labs Lab 05/20/14 2031  PHART 7.488*  PCO2ART 31.2*  PO2ART 198.0*   Liver Enzymes  Recent Labs Lab 05/17/14 0137 05/20/14 1626 05/22/14 0257  AST 24 20  --   ALT 35 30  --   ALKPHOS 143* 151*  --   BILITOT 0.4 0.3  --   ALBUMIN 3.1* 3.2* 2.9*   Cardiac Enzymes  Recent Labs Lab 05/21/14 0550  TROPONINI <0.03   Glucose  Recent Labs Lab 05/17/14 0138 05/20/14 2355 05/21/14 0350 05/21/14 0830 05/21/14  [place] 89 72 88    Imaging Dg Chest Port 1 View  05/21/2014   CLINICAL DATA:  Acute respiratory failure/hypoxia  EXAM: PORTABLE CHEST - 1 VIEW  COMPARISON:  May 20, 2014  FINDINGS: Endotracheal tube tip is 4.3 cm above the carina. Nasogastric tube tip and side port are below the diaphragm. No pneumothorax. There is a small amount of fluid tracking in the right minor fissure. There is no edema or consolidation. Heart size and pulmonary vascularity are normal. No adenopathy. No bone lesions.  IMPRESSION: Small right effusion. No edema or consolidation. Tube positions as described without pneumothorax.   Electronically Signed   By: Lowella Grip M.D.   On: 05/21/2014 07:34     ASSESSMENT /  PLAN:  PULMONARY OETT 1/9 >> 1/10 A: Acute respiratory failure due to suppressed MS Possible RLL CAP, was treated outpt with azithromycin- no consolidation on CXR P:   - Off vent for 24 hours, doing well - d/c antibiotics today - Can transfer to med-surg bed  NEUROLOGIC A:  Seizure disorder with decompensation, ? Due to infection, ? 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 CAP. Started on azithro PO 1/6 CXR 1/10 with no consolidation P:   BCx2 1/9 >>  UC 1/9 >>  Resp cx 1/9 >>   Levaquin 1/9 >> 1/11  Afebrile. No leukocytosis. Stop levofloxacin today.   ENDOCRINE A:  Hyperglycemia- resolved  P:   - follow glucose on bmets   FAMILY  - Updates: 1/10  - Inter-disciplinary family meet or Palliative Care meeting due by:  05/27/14   Albin Felling, MD Internal Medicine Resident, PGY-1  PCCM ATTENDING: I have reviewed pt's initial presentation, consultants notes and hospital database in detail.  The above assessment and plan was formulated under my direction.  In summary: Status epilepticus - resolved Acute respiratory failure, intubated for intractable seizures and AMS - resolved Possible recent PNA - treated New onset AF now with CVR - paln as above  He  can safely go to Tele bed. TR[facility] to assume care as of AM 1/12 and PCCM to sign off. Discussed with Dr Nicolette Bang, MD;  PCCM service; Mobile [phone]

## 2014-05-22 NOTE — Consult Note (Addendum)
CONSULTATION NOTE  Reason for Consult: A-fib with RVR  Requesting Physician: Dr. Lamonte Sakai  Cardiologist: None (NEW)  HPI: This is a 64 y.o. 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 He was apparently excessively sedated and he came obtunded requiring intubation for airway protection. He was also found to have a pneumonia. Earlier this morning he went into atrial fibrillation with rapid ventricular response. He is unaware of this. He has no history of A. fib that he is aware of. His mother did have a history of A. fib however she was very late in age with onset. He's denied any chest pain or worsening shortness of breath. Cardiology is asked to evaluate and recommend management for atrial fibrillation. Of note he is on chronic warfarin therapy for his DVT.  PMHx:  Past Medical History  Diagnosis Date  . Coronary artery disease   . Depression   . Peripheral vascular disease   . Seizures   . Deep vein thrombophlebitis of left leg   . Deep vein thrombosis of right lower extremity   . Psoriasis   . Arthritis   . DVT (deep venous thrombosis)     multiple times  . Weakness of both legs   . Peripheral neuropathy   . Venous stasis   . Hyperplasia of prostate   . BPH (benign prostatic hyperplasia)   . Hyperlipidemia    Past Surgical History  Procedure Laterality Date  . Colonoscopy  03/26/2012    Procedure: COLONOSCOPY;  Surgeon: Beryle Beams, MD;  Location: [facility];  Service: Endoscopy;  Laterality: N/A;  . Tonsillectomy      as child  . Flexible sigmoidoscopy N/A 12/09/2013    Procedure: FLEXIBLE SIGMOIDOSCOPY;  Surgeon: Beryle Beams, MD;  Location: [facility];  Service: Endoscopy;  Laterality: N/A;  . Laparotomy N/A  12/25/2013    Procedure: EXPLORATORY LAPAROTOMY/Transverse and right coloectomy/ end  ileostomy and mucus  fistula;  Surgeon: Ralene Ok, MD;  Location: [place];  Service: General;  Laterality: N/A;  . Ankle fracture surgery  09/04/13  . Colostomy  12-25-13    Ruptured colon    FAMHx: Family History  Problem Relation Age of Onset  . Vision loss Father   . Hypertension Father   . Diabetes Father   . Heart disease Father   . Cancer Father     colon  . Diabetes Mother   . Hypertension Mother   . Heart disease Mother   . Dementia Mother   . Cancer Mother     breast ca  . Diabetes Sister   . Hyperlipidemia Sister   . Hypertension Sister   . Diabetes Brother   . Kidney disease Brother   . Heart disease Brother     SOCHx:  reports that he quit smoking about 9 months ago. His smoking use included Pipe. He does not have any smokeless tobacco history on file. He reports that he does not drink alcohol or use illicit drugs.  ALLERGIES: Allergies  Allergen Reactions  . Penicillins     Mother and brother have had severe allergic reactions, no personal reaction    ROS: A comprehensive review of systems was negative except for: Respiratory: positive for cough Cardiovascular: positive for irregular heart beat Neurological: positive for seizures  HOME MEDICATIONS: Prescriptions prior to admission  Medication Sig Dispense Refill Last Dose  . acetaminophen (TYLENOL) 500 MG tablet Take 1 tablet (500 mg total) by mouth 2 (two) times daily as needed for mild pain or fever. 30 tablet 0 unknown  . azithromycin (ZITHROMAX) 250 MG tablet Take 1 tablet (250 mg total) by mouth daily. 4 tablet 0 05/20/2014 at Unknown time  . Calcium Carb-Cholecalciferol (CALCIUM-VITAMIN D) 600-400 MG-UNIT TABS Take 1 tablet by mouth daily. For calcium supplement   05/20/2014 at Unknown time  . DULoxetine (CYMBALTA) 60 MG capsule Take 60 mg by mouth daily.   05/20/2014 at Unknown time  . gabapentin (NEURONTIN) 600 MG  tablet Take 1 tablet (600 mg total) by mouth 3 (three) times daily. 90 tablet 1 05/20/2014 at Unknown time  . levETIRAcetam (KEPPRA) 750 MG tablet Take 2 tablets (1,500 mg total) by mouth 3 (three) times daily. For seizures 270 tablet 3 05/20/2014 at Unknown time  . LORazepam (ATIVAN) 1 MG tablet Use 1 mg tab to break focal seizure activity. As needed, do not exceed 2 mg a day. 30 tablet 0 05/20/2014 at Unknown time  . Multiple Vitamin (MULTIVITAMIN WITH MINERALS) TABS Take 1 tablet by mouth daily.   05/20/2014 at Unknown time  . phenytoin (DILANTIN) 100 MG ER capsule Take 1 capsule (100 mg total) by mouth at bedtime. To be taken four times a day on Sunday, Tuesday, Thursday, and Saturday 270 capsule 3 05/20/2014 at Unknown time  . pravastatin (PRAVACHOL) 20 MG tablet Take 20 mg by mouth daily. For HLD   05/20/2014 at Unknown time  . tamsulosin (FLOMAX) 0.4 MG CAPS capsule Take 0.4 mg by mouth daily. For BPH   05/20/2014 at Unknown time  . UNABLE TO FIND Med Name: [name] Powder.  For use around colostomy   05/19/2014 at Unknown time  . vitamin B-12 (CYANOCOBALAMIN) 1000 MCG tablet Take 1,000 mcg by mouth daily. For supplement   05/20/2014 at Unknown time  . warfarin (COUMADIN) 2 MG tablet Take 2 mg by mouth every evening. Take with 67m tablet for a total dose of 756mDAILY   05/19/2014 at Unknown time  . warfarin (COUMADIN) 5 MG tablet Take 5 mg by mouth every evening. Take with 70m63mablet for a total dose of 7mg34mILY   05/19/2014 at Unknown time  . enoxaparin (LOVENOX) 120 MG/0.8ML injection Inject 0.8 mLs (120 mg total) into the skin every 12 (twelve) hours. Discontinue once INR is greater than 2. (Patient not taking: Reported on 05/17/2014) 6 Syringe 0 Not Taking at Unknown time  . Gabapentin, Once-Daily, 600 MG TABS Gralise 600 mg in AM and 1200 mg in PM. Po (Patient not taking: Reported on 05/17/2014) 90 tablet 5 Not Taking at Unknown time  . HYDROcodone-acetaminophen (NORCO/VICODIN) 5-325 MG per tablet Take 1-2  tablets by mouth every 6 (six) hours as needed for moderate pain or severe pain. (Patient not taking: Reported on 05/20/2014) 20 tablet 0 Not Taking at Unknown time  . phenytoin (DILANTIN) 100 MG ER capsule Take 1 capsule (100 mg total) by mouth 3 (three) times daily. Give 1 capsule by mouth three times daily on Monday, Wednesday, and Friday. (Patient not taking: Reported on 05/20/2014) 270 capsule 3 Past Week at Unknown time    HOSPITAL MEDICATIONS: Scheduled: . DULoxetine  60 mg Oral Daily  . gabapentin  600 mg Per Tube TID  . levETIRAcetam  1,500 mg Intravenous 3 times per day  . phenytoin (DILANTIN) IV  400 mg Intravenous QHS  . pravastatin  20 mg Oral q1800  . Warfarin - Pharmacist Dosing Inpatient   Does not apply q1800    VITALS: Blood pressure 100/65, pulse 104, temperature 97.8 F (36.6 C), temperature source Oral, resp. rate 14, height 6' 3"  (1.905 m), weight 287 lb 4.2 oz (130.3 kg), SpO2 92 %.  PHYSICAL EXAM: General appearance: alert, no distress and sitting up in bed Neck: no carotid bruit and no JVD Lungs: clear to auscultation bilaterally Heart: irregularly irregular rhythm and S1, S2 normal Abdomen: soft, non-tender; bowel sounds normal; no masses,  no organomegaly Extremities: edema 1+ bilateral LE edema and venous stasis dermatitis noted Pulses: 2+ and symmetric Skin: venous stasis changes  Neurologic: Mental status: Alert, oriented, thought content appropriate Psych: Pleasant  LABS: Results for orders placed or performed during the hospital encounter of 05/20/14 (from the past 48 hour(s))  CBC with Differential     Status: Abnormal   Collection Time: 05/20/14  4:26 PM  Result Value Ref Range   WBC 6.0 4.0 - 10.5 K/uL   RBC 4.99 4.22 - 5.81 MIL/uL   Hemoglobin 15.4 13.0 - 17.0 g/dL   HCT 46.3 39.0 - 52.0 %   MCV 92.8 78.0 - 100.0 fL   MCH 30.9 26.0 - 34.0 pg   MCHC 33.3 30.0 - 36.0 g/dL   RDW 13.4 11.5 - 15.5 %   Platelets 149 (L) 150 - 400 K/uL    Neutrophils Relative % 72 43 - 77 %   Neutro Abs 4.3 1.7 - 7.7 K/uL   Lymphocytes Relative 17 12 - 46 %   Lymphs Abs 1.0 0.7 - 4.0 K/uL   Monocytes Relative 8 3 - 12 %   Monocytes Absolute 0.5 0.1 - 1.0 K/uL   Eosinophils Relative 3 0 - 5 %   Eosinophils Absolute 0.2 0.0 - 0.7 K/uL   Basophils Relative 0 0 - 1 %   Basophils Absolute 0.0 0.0 - 0.1 K/uL  Comprehensive metabolic panel     Status: Abnormal   Collection Time: 05/20/14  4:26 PM  Result Value Ref Range   Sodium 136 135 - 145 mmol/L    Comment: Please note change in reference range.   Potassium 4.3 3.5 - 5.1 mmol/L    Comment: Please note change in reference range.   Chloride 104 96 - 112 mEq/L   CO2 28 19 - 32 mmol/L   Glucose, Bld 101 (H) 70 - 99 mg/dL   BUN 9 6 - 23 mg/dL   Creatinine, Ser 0.55 0.50 - 1.35 mg/dL   Calcium 8.7 8.4 - 10.5 mg/dL   Total Protein 6.3 6.0 - 8.3 g/dL   Albumin 3.2 (L) 3.5 - 5.2 g/dL   AST 20 0 - 37 U/L   ALT 30 0 - 53 U/L   Alkaline Phosphatase 151 (H) 39 - 117 U/L   Total Bilirubin 0.3 0.3 - 1.2 mg/dL   GFR calc non Af Amer >90 >90 mL/min   GFR calc Af Amer >90 >90 mL/min    Comment: (NOTE) The eGFR has been calculated using the CKD EPI equation. This calculation has not been validated in all clinical situations. eGFR's persistently <90 mL/min signify possible Chronic Kidney Disease.    Anion gap 4 (L) 5 - 15  Phenytoin level, total     Status: Abnormal   Collection Time: 05/20/14  4:26 PM  Result Value Ref Range   Phenytoin Lvl 7.2 (L) 10.0 - 20.0 ug/mL  Protime-INR     Status: Abnormal   Collection Time: 05/20/14  4:26 PM  Result Value Ref Range   Prothrombin Time 25.6 (H) 11.6 - 15.2 seconds   INR 2.31 (H) 0.00 - 1.49  I-Stat CG4 Lactic Acid, ED     Status: None   Collection Time: 05/20/14  5:34 PM  Result Value Ref Range   Lactic Acid, Venous 1.17 0.5 - 2.2 mmol/L  I-Stat Venous Blood Gas, ED (order at [facility] and [facility] only)     Status: Abnormal   Collection Time: 05/20/14   5:35 PM  Result Value Ref Range   pH, Ven 7.341 (H) 7.250 - 7.300   pCO2, Ven 56.8 (H) 45.0 - 50.0 mmHg   pO2, Ven 239.0 (H) 30.0 - 45.0 mmHg   Bicarbonate 30.7 (H) 20.0 - 24.0 mEq/L   TCO2 32 0 - 100 mmol/L   O2 Saturation 100.0 %   Acid-Base Excess 3.0 (H) 0.0 - 2.0 mmol/L   Sample type VENOUS   Urinalysis, Routine w reflex microscopic     Status: Abnormal   Collection Time: 05/20/14  7:48 PM  Result Value Ref Range   Color, Urine YELLOW YELLOW   APPearance HAZY (A) CLEAR   Specific Gravity, Urine 1.021 1.005 - 1.030   pH 5.0 5.0 - 8.0   Glucose, UA NEGATIVE NEGATIVE mg/dL   Hgb urine dipstick NEGATIVE NEGATIVE   Bilirubin Urine NEGATIVE NEGATIVE   Ketones, ur NEGATIVE NEGATIVE mg/dL   Protein, ur NEGATIVE NEGATIVE mg/dL   Urobilinogen, UA 0.2 0.0 - 1.0 mg/dL   Nitrite NEGATIVE NEGATIVE   Leukocytes, UA NEGATIVE NEGATIVE    Comment: MICROSCOPIC NOT DONE ON URINES WITH NEGATIVE PROTEIN, BLOOD, LEUKOCYTES, NITRITE, OR GLUCOSE <1000 mg/dL.  I-STAT 3, arterial blood gas (G3+)     Status: Abnormal   Collection Time: 05/20/14  8:31 PM  Result Value Ref Range   pH, Arterial 7.488 (H) 7.350 - 7.450   pCO2 arterial 31.2 (L) 35.0 - 45.0 mmHg   pO2, Arterial 198.0 (H) 80.0 - 100.0 mmHg   Bicarbonate 23.7 20.0 - 24.0 mEq/L   TCO2 25 0 - 100 mmol/L   O2 Saturation 100.0 %   Acid-Base Excess 1.0 0.0 - 2.0 mmol/L   Patient temperature 98.6 F    Collection site RADIAL, ALLEN'S TEST ACCEPTABLE    Drawn by RT    Sample type ARTERIAL   MRSA PCR Screening     Status: None   Collection Time: 05/20/14  8:57 PM  Result Value Ref Range   MRSA by PCR NEGATIVE NEGATIVE    Comment:        The GeneXpert MRSA Assay (FDA approved for NASAL specimens only), is one component of a comprehensive MRSA colonization surveillance program. It is not intended to diagnose MRSA infection nor to guide or monitor treatment for MRSA infections.   Culture, blood (routine x 2)     Status: None (Preliminary  result)   Collection Time: 05/20/14  9:00 PM  Result Value Ref Range   Specimen Description BLOOD RIGHT HAND    Special Requests BOTTLES DRAWN AEROBIC AND ANAEROBIC 10CC    Culture             BLOOD CULTURE RECEIVED NO GROWTH TO DATE CULTURE WILL BE HELD FOR 5 DAYS BEFORE ISSUING A FINAL NEGATIVE REPORT Performed at [facility]    Report Status PENDING   Culture, blood (routine x 2)     Status: None (Preliminary result)  Collection Time: 05/20/14  9:10 PM  Result Value Ref Range   Specimen Description BLOOD RIGHT ARM    Special Requests BOTTLES DRAWN AEROBIC AND ANAEROBIC 10CC    Culture             BLOOD CULTURE RECEIVED NO GROWTH TO DATE CULTURE WILL BE HELD FOR 5 DAYS BEFORE ISSUING A FINAL NEGATIVE REPORT Performed at [facility]    Report Status PENDING   Glucose, capillary     Status: None   Collection Time: 05/20/14 11:55 PM  Result Value Ref Range   Glucose-Capillary 97 70 - 99 mg/dL  Glucose, capillary     Status: None   Collection Time: 05/21/14  3:50 AM  Result Value Ref Range   Glucose-Capillary 89 70 - 99 mg/dL  Troponin I     Status: None   Collection Time: 05/21/14  5:50 AM  Result Value Ref Range   Troponin I <0.03 <0.031 ng/mL    Comment:        NO INDICATION OF MYOCARDIAL INJURY. Please note change in reference range.   CBC     Status: Abnormal   Collection Time: 05/21/14  5:50 AM  Result Value Ref Range   WBC 7.5 4.0 - 10.5 K/uL   RBC 5.10 4.22 - 5.81 MIL/uL   Hemoglobin 16.1 13.0 - 17.0 g/dL   HCT 46.8 39.0 - 52.0 %   MCV 91.8 78.0 - 100.0 fL   MCH 31.6 26.0 - 34.0 pg   MCHC 34.4 30.0 - 36.0 g/dL   RDW 13.6 11.5 - 15.5 %   Platelets 130 (L) 150 - 400 K/uL  Basic metabolic panel     Status: None   Collection Time: 05/21/14  5:50 AM  Result Value Ref Range   Sodium 139 135 - 145 mmol/L    Comment: Please note change in reference range.   Potassium 3.9 3.5 - 5.1 mmol/L    Comment: Please note change in reference range.    Chloride 103 96 - 112 mEq/L   CO2 25 19 - 32 mmol/L   Glucose, Bld 85 70 - 99 mg/dL   BUN 8 6 - 23 mg/dL   Creatinine, Ser 0.60 0.50 - 1.35 mg/dL   Calcium 8.7 8.4 - 10.5 mg/dL   GFR calc non Af Amer >90 >90 mL/min   GFR calc Af Amer >90 >90 mL/min    Comment: (NOTE) The eGFR has been calculated using the CKD EPI equation. This calculation has not been validated in all clinical situations. eGFR's persistently <90 mL/min signify possible Chronic Kidney Disease.    Anion gap 11 5 - 15  Magnesium     Status: None   Collection Time: 05/21/14  5:50 AM  Result Value Ref Range   Magnesium 1.9 1.5 - 2.5 mg/dL  Phosphorus     Status: None   Collection Time: 05/21/14  5:50 AM  Result Value Ref Range   Phosphorus 3.6 2.3 - 4.6 mg/dL  Phenytoin level, total     Status: None   Collection Time: 05/21/14  5:50 AM  Result Value Ref Range   Phenytoin Lvl 13.2 10.0 - 20.0 ug/mL  Protime-INR     Status: Abnormal   Collection Time: 05/21/14  5:50 AM  Result Value Ref Range   Prothrombin Time 28.8 (H) 11.6 - 15.2 seconds   INR 2.69 (H) 0.00 - 1.49  Urine culture     Status: None   Collection Time: 05/21/14  6:10 AM  Result Value Ref Range   Specimen Description URINE, RANDOM    Special Requests NONE    Colony Count NO GROWTH Performed at [facility]     Culture NO GROWTH Performed at [facility]     Report Status 05/22/2014 FINAL   Glucose, capillary     Status: None   Collection Time: 05/21/14  8:30 AM  Result Value Ref Range   Glucose-Capillary 72 70 - 99 mg/dL  Culture, respiratory (tracheal aspirate)     Status: None (Preliminary result)   Collection Time: 05/21/14  9:56 AM  Result Value Ref Range   Specimen Description TRACHEAL ASPIRATE    Special Requests NONE    Gram Stain PENDING    Culture      Culture reincubated for better growth Performed at [facility]    Report Status PENDING   Glucose, capillary     Status: None   Collection Time:  05/21/14 12:47 PM  Result Value Ref Range   Glucose-Capillary 88 70 - 99 mg/dL  Protime-INR     Status: Abnormal   Collection Time: 05/22/14  2:57 AM  Result Value Ref Range   Prothrombin Time 31.6 (H) 11.6 - 15.2 seconds   INR 3.03 (H) 0.00 - 1.49  Albumin     Status: Abnormal   Collection Time: 05/22/14  2:57 AM  Result Value Ref Range   Albumin 2.9 (L) 3.5 - 5.2 g/dL  CBC     Status: Abnormal   Collection Time: 05/22/14  2:57 AM  Result Value Ref Range   WBC 7.0 4.0 - 10.5 K/uL   RBC 4.73 4.22 - 5.81 MIL/uL   Hemoglobin 15.0 13.0 - 17.0 g/dL   HCT 44.8 39.0 - 52.0 %   MCV 94.7 78.0 - 100.0 fL   MCH 31.7 26.0 - 34.0 pg   MCHC 33.5 30.0 - 36.0 g/dL   RDW 13.6 11.5 - 15.5 %   Platelets 148 (L) 150 - 400 K/uL  Phenytoin level, total     Status: None   Collection Time: 05/22/14  2:57 AM  Result Value Ref Range   Phenytoin Lvl 13.8 10.0 - 20.0 ug/mL  Phenytoin level, total     Status: None   Collection Time: 05/22/14  7:13 AM  Result Value Ref Range   Phenytoin Lvl 13.3 10.0 - 20.0 ug/mL    IMAGING: Ct Head Wo Contrast  05/20/2014   CLINICAL DATA:  Seizures, patient had seizure in CT  EXAM: CT HEAD WITHOUT CONTRAST  TECHNIQUE: Contiguous axial images were obtained from the base of the skull through the vertex without intravenous contrast.  COMPARISON:  MRI brain 12140 for  FINDINGS: There is no evidence of mass effect, midline shift, or extra-axial fluid collections. There is no evidence of a space-occupying lesion or intracranial hemorrhage. There is no evidence of a cortical-based area of acute infarction. There is generalized cerebral atrophy. There is periventricular white matter low attenuation likely secondary to microangiopathy.  The ventricles and sulci are appropriate for the patient's age. The basal cisterns are patent.  Visualized portions of the orbits are unremarkable. Is near complete opacification of bilateral maxillary sinuses, ethmoid sinuses and frontal sinus cyst.  There is mild mucosal thickening of the left sphenoid sinus with an air-fluid level.  The osseous structures are unremarkable.  IMPRESSION: 1. No acute intracranial pathology. 2. Pansinus disease.   Electronically Signed   By: Kathreen Devoid   On: 05/20/2014 17:23   Dg Chest  Port 1 View  05/21/2014   CLINICAL DATA:  Acute respiratory failure/hypoxia  EXAM: PORTABLE CHEST - 1 VIEW  COMPARISON:  May 20, 2014  FINDINGS: Endotracheal tube tip is 4.3 cm above the carina. Nasogastric tube tip and side port are below the diaphragm. No pneumothorax. There is a small amount of fluid tracking in the right minor fissure. There is no edema or consolidation. Heart size and pulmonary vascularity are normal. No adenopathy. No bone lesions.  IMPRESSION: Small right effusion. No edema or consolidation. Tube positions as described without pneumothorax.   Electronically Signed   By: Lowella Grip M.D.   On: 05/21/2014 07:34   Dg Chest Port 1 View  05/20/2014   CLINICAL DATA:  64 year old intubated male patient.  EXAM: PORTABLE CHEST - 1 VIEW  COMPARISON:  Chest x-ray 05/17/2014.  FINDINGS: Interval placement of endotracheal tube with tip 4.4 cm above the carina. Nasogastric tube in position, occult on the proximal stomach. Lung volumes are very low. Study is limited by lack of visualization of the lower left hemithorax. With these limitations in mind, there are patchy areas of interstitial prominence and ill-defined airspace disease in the lungs bilaterally, favored to reflect evolving multilobar bronchopneumonia, most apparent in the perihilar aspect of the right lung and in the left upper lobe. No definite pleural effusions. No evidence of pulmonary edema. Heart size is borderline enlarged. The patient is rotated to the left on today's exam, resulting in distortion of the mediastinal contours and reduced diagnostic sensitivity and specificity for mediastinal pathology.  IMPRESSION: 1. Support apparatus, as above. 2. Patchy  multifocal interstitial and airspace opacities in lungs bilaterally, concerning for multilobar bronchopneumonia.   Electronically Signed   By: Vinnie Langton M.D.   On: 05/20/2014 19:07    HOSPITAL DIAGNOSES: Principal Problem:   Seizures Active Problems:   Acute respiratory failure with hypoxia   Atrial fibrillation with rapid ventricular response   IMPRESSION: 1. New onset a-fib with RVR  RECOMMENDATION: 1. Mr. Torrence has new onset A. fib with RVR. He denies any awareness of his atrial fibrillation. He has been therapeutic on warfarin for a DVT. An echocardiogram was ordered and will be reviewed. Initial troponin is negative however this event does not sound ischemically mediated. Would recommend starting Cardizem 30 mg every 8 hours, with his first dose now. As long as his INR remains therapeutic we could consider DCCV prior to discharge if he does not convert back to sinus on his own, once his pneumonia is improved and seizures have abated. Given a reported history of coronary disease, a repeat stress test either during this admission or as an outpatient would be reasonable.  Thank you for the consultation. Cardiology will follow with you.  Time Spent Directly with Patient: 30 minutes  Pixie Casino, MD, [facility] Attending Cardiologist [facility]  HILTY,Kenneth C 05/22/2014, 1:57 PM

## 2014-05-22 NOTE — Progress Notes (Signed)
Subjective: Patient had no new complaints. No recurrent seizure activity reported overnight.  Objective: Current vital signs: BP 104/65 mmHg  Pulse 61  Temp(Src) 97.5 F (36.4 C) (Oral)  Resp 14  Ht 6\' 3"  (1.905 m)  Wt 130.3 kg (287 lb 4.2 oz)  BMI 35.90 kg/m2  SpO2 95%  Neurologic Exam: Alert and in no acute distress. Patient was well-oriented to time as well as place. Pupils were equal and reacted normally to light. Extraocular movements were full and conjugate. No facial weakness was noted. Patient moved extremities equally. Coordination was normal.  Dilantin level was 13.3 this a.m., corrected to 18 based on albumin level of 05/20/2013.  Medications: I have reviewed the patient's current medications.  Assessment/Plan: 64 year old man who was admitted with pneumonia and breakthrough seizure activity with status epilepticus. Dilantin level was low on admission, but has been corrected to normal. He has been extubated and had no further seizure activity. He is alert with normal mental status at this point.  Recommend no changes in current management. No further neurological intervention is indicated at this point. We will plan to see him in follow-up on an as-needed basis after this visit.  C.R. Nicole Kindred, MD [facility] [phone]  05/22/2014  8:59 AM

## 2014-05-22 NOTE — Progress Notes (Signed)
ANTICOAGULATION CONSULT NOTE - Follow Up Consult  Pharmacy Consult for Coumadin Indication: History of DVT  Allergies  Allergen Reactions  . Penicillins     Mother and brother have had severe allergic reactions, no personal reaction    Patient Measurements: Height: 6\' 3"  (190.5 cm) Weight: 287 lb 4.2 oz (130.3 kg) IBW/kg (Calculated) : 84.5 Vital Signs: Temp: 97.5 F (36.4 C) (01/11 0823) Temp Source: Oral (01/11 0823) BP: 104/65 mmHg (01/11 0700) Pulse Rate: 61 (01/11 0700) Labs:  Recent Labs  05/20/14 1626 05/21/14 0550 05/22/14 0257  HGB 15.4 16.1 15.0  HCT 46.3 46.8 44.8  PLT 149* 130* 148*  LABPROT 25.6* 28.8* 31.6*  INR 2.31* 2.69* 3.03*  CREATININE 0.55 0.60  --   TROPONINI  --  <0.03  --     Estimated Creatinine Clearance: 141 mL/min (by C-G formula based on Cr of 0.6).   Medications:  PTA- Coumadin 7 mg po daily  Assessment: 64 year old male admitted with seizures and acute respiratory failure on chronic Coumadin for history of DVT. Pharmacy consulted to dose Coumadin while inpatient.   INR on admission was therapeutic at 2.31. Today's INR is SUPRAtherapeutic at 3.03. H/H is stable. Platelets 149 on admit & stable now at 148 - will monitor. No bleeding reported.   Note- Patient was initiated on Levaquin therapy on 1/9 which may interact to elevate the INR, but was discontinued 1/10  Goal of Therapy:  INR 2-3    Plan:  - Repeat Coumadin to 3.5mg  po x1 today. - Follow-up PT/INR.    Thank you for this consult, Gloriajean Dell, PharmD Candidate   Remigio Eisenmenger D. Mina Marble, PharmD, BCPS Pager:  [phone] 05/22/2014, 3:24 PM

## 2014-05-22 NOTE — Consult Note (Signed)
WOC wound consult note Reason for Consult: pt admitted with Stage II PU, ileostomy, midline open wound, and MF Wound type: Non healing surgical wound, S/P ileosotmy and MF, Stage II Pressure ulcer right buttock  Pressure Ulcer POA: Yes Measurement:  Right buttock wound: 0.5cm x 0.5cm x 0.1cm  Midline abdominal wound: 1.5cm x 0.5cm x 0.1 Wound bed: Right buttock: clean, pink, moist, partial thickness skin loss Midline abdominal wound: hypergranulation tissue Drainage (amount, consistency, odor) oozing serosanguinous at the midline abdomen.  None from the buttock.  No odor Periwound: intact Dressing procedure/placement/frequency: NS moist gauze for the small area midline, cover with ABD pad.  Silicone foam dressing for the buttock wound to protect and insulate.  WOC ostomy consult note Stoma type/location: RUQ, end ileostomy, LLQ Mucous fistula  Stomal assessment/size: Ileostomy- 1" x 1 1/2" oval, somewhat flush stoma; MF: oval 1 1/"2 inch.  Both pink, moist Peristomal assessment: intact at the MF, some mild denudation from 4-7 o'clock at the ileostomy site. Treatment options for stomal/peristomal skin: used 2" barrier ring to protect the denuded skin Output liquid green output from the ileostomy, increased volume.  Mucous only from the MF.  Ostomy pouching: 1pc.convex used with 2" barrier ring to aid in seal for the ileostomy and to protect the peristomal skin.  MF cover provided and placed, 1 extra left at bedside.  WOC will follow along with you for support with ostomy care and wound.  Supplies ordered for bedside nurse to use.  Kent Braunschweig [place] RN,CWOCN [phone]

## 2014-05-23 ENCOUNTER — Encounter (HOSPITAL_COMMUNITY): Payer: Self-pay | Admitting: Cardiology

## 2014-05-23 DIAGNOSIS — Z7901 Long term (current) use of anticoagulants: Secondary | ICD-10-CM

## 2014-05-23 DIAGNOSIS — Z86718 Personal history of other venous thrombosis and embolism: Secondary | ICD-10-CM

## 2014-05-23 DIAGNOSIS — I369 Nonrheumatic tricuspid valve disorder, unspecified: Secondary | ICD-10-CM

## 2014-05-23 LAB — TROPONIN I: Troponin I: 0.03 ng/mL (ref <0.031)

## 2014-05-23 LAB — CULTURE, RESPIRATORY W GRAM STAIN

## 2014-05-23 LAB — CULTURE, RESPIRATORY

## 2014-05-23 LAB — PROTIME-INR
INR: 2.62 — ABNORMAL HIGH (ref 0.00–1.49)
Prothrombin Time: 28.2 seconds — ABNORMAL HIGH (ref 11.6–15.2)

## 2014-05-23 MED ORDER — WARFARIN SODIUM 6 MG PO TABS
7.0000 mg | ORAL_TABLET | Freq: Once | ORAL | Status: AC
  Administered 2014-05-23: 7 mg via ORAL
  Filled 2014-05-23: qty 1

## 2014-05-23 MED ORDER — METOPROLOL TARTRATE 12.5 MG HALF TABLET
12.5000 mg | ORAL_TABLET | Freq: Two times a day (BID) | ORAL | Status: DC
  Administered 2014-05-23 (×2): 12.5 mg via ORAL
  Filled 2014-05-23 (×2): qty 1

## 2014-05-23 NOTE — Progress Notes (Signed)
ANTICOAGULATION CONSULT NOTE - Follow Up Consult  Pharmacy Consult for Coumadin Indication: History of DVT, new afib  Allergies  Allergen Reactions  . Penicillins     Mother and brother have had severe allergic reactions, no personal reaction    Patient Measurements: Height: 6\' 3"  (190.5 cm) Weight: 284 lb 1.6 oz (128.867 kg) IBW/kg (Calculated) : 84.5 Vital Signs: Temp: 98.3 F (36.8 C) (01/12 0500) Temp Source: Oral (01/12 0500) BP: 109/57 mmHg (01/12 0500) Pulse Rate: 50 (01/12 0500) Labs:  Recent Labs  05/20/14 1626 05/21/14 0550 05/22/14 0257 05/22/14 1256 05/23/14 0528  HGB 15.4 16.1 15.0  --   --   HCT 46.3 46.8 44.8  --   --   PLT 149* 130* 148*  --   --   LABPROT 25.6* 28.8* 31.6*  --  28.2*  INR 2.31* 2.69* 3.03*  --  2.62*  CREATININE 0.55 0.60  --   --   --   TROPONINI  --  <0.03  --  <0.03 0.03    Estimated Creatinine Clearance: 140.3 mL/min (by C-G formula based on Cr of 0.6).  Medications:  PTA- Coumadin 7 mg po daily  Assessment: 64 year old male admitted with seizures and acute respiratory failure on chronic coumadin for history of DVT. INR remains therapeutic at 2.62. No new CBC today and no bleeding noted. Pt was previously on levaquin which may have increased INR but now off.   Goal of Therapy:  INR 2-3  Plan:  1. Warfarin 7mg  PO x 1 tonight (normal home dose) 2. F/u AM INR  Salome Arnt, PharmD, BCPS Pager # [phone] 05/23/2014 8:31 AM

## 2014-05-23 NOTE — Progress Notes (Signed)
[facility]  Patient Status: Active (receiving services up to time of hospitalization)  [facility] is providing the following services: RN and PT  If patient discharges after hours, please call [phone].   Consepcion Hearing 05/23/2014, 1:13 PM

## 2014-05-23 NOTE — Progress Notes (Signed)
Subjective:  No complaints  Objective:  Vital Signs in the last 24 hours: Temp:  [97.5 F (36.4 C)-98.3 F (36.8 C)] 97.9 F (36.6 C) (01/12 1342) Pulse Rate:  [50-113] 82 (01/12 1342) Resp:  [18-20] 20 (01/12 1342) BP: (92-111)/(57-75) 95/65 mmHg (01/12 1342) SpO2:  [92 %-98 %] 92 % (01/12 1342) Weight:  [282 lb 3.2 oz (128.005 kg)-284 lb 1.6 oz (128.867 kg)] 284 lb 1.6 oz (128.867 kg) (01/12 0500)  Intake/Output from previous day:  Intake/Output Summary (Last 24 hours) at 05/23/14 1434 Last data filed at 05/23/14 0738  Gross per 24 hour  Intake      0 ml  Output    880 ml  Net   -880 ml    Physical Exam: General appearance: alert, cooperative and no distress Neck: no JVD Lungs: decreased breath sounds Heart: irregularly irregular rhythm   Rate: 80-110  Rhythm: atrial fibrillation  Lab Results:  Recent Labs  05/21/14 0550 05/22/14 0257  WBC 7.5 7.0  HGB 16.1 15.0  PLT 130* 148*    Recent Labs  05/20/14 1626 05/21/14 0550  NA 136 139  K 4.3 3.9  CL 104 103  CO2 28 25  GLUCOSE 101* 85  BUN 9 8  CREATININE 0.55 0.60    Recent Labs  05/22/14 1256 05/23/14 0528  TROPONINI <0.03 0.03    Recent Labs  05/23/14 0528  INR 2.62*    Imaging: Imaging results have been reviewed  Cardiac Studies: Echo 05/23/14  Study Conclusions  - Left ventricle: The cavity size was normal. There was mild concentric hypertrophy. Systolic function was vigorous. The estimated ejection fraction was in the range of 65% to 70%. Wall motion was normal; there were no regional wall motion abnormalities. The study was not technically sufficient to allow evaluation of LV diastolic dysfunction due to atrial fibrillation. - Aortic valve: Trileaflet; normal thickness leaflets. There was no regurgitation. - Aortic root: The aortic root was normal in size. - Mitral valve: Structurally normal valve. There was no regurgitation. - Left atrium: The atrium  was mildly dilated. - Right ventricle: Systolic function was normal. - Right atrium: The atrium was normal in size. - Tricuspid valve: There was mild regurgitation. - Pulmonic valve: There was no regurgitation. - Pulmonary arteries: Systolic pressure was within the normal range. - Inferior vena cava: The vessel was normal in size. - Pericardium, extracardiac: There was no pericardial effusion.  Impressions:  - Limited quality study, but there appears to be normal biventricular size and systolic function. No regional wall motion abnormalities. Mild tricuspid regurgitation.  Assessment/Plan:  64 y.o. male with a past medical history significant for seizure disorder, DVT on Coumadin "for years". He denies any history of CAD, MI, or cath. 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 He has a history of seizures and chronic Dilantin use. He presented with progressive seizures. He was apparently excessively sedated and he came obtunded requiring intubation for airway protection. He was also found to have a pneumonia. On the morning of 05/22/14 he went into atrial fibrillation with rapid ventricular response.  Principal Problem:   Seizures Active Problems:   Acute respiratory failure with hypoxia   Atrial fibrillation with rapid ventricular response   History of DVT (deep vein thrombosis)   Chronic anticoagulation   Hyperlipidemia   PLAN: Will review timing of cardioversion with MD.  Though he has been on chronic Coumadin I don't think we have 4 weeks of  documented therapeutic INR so he would need a TEE. It may be best to wait  till he fully recovers from his pneumonia anyway.  Change Lopressor to po. His B/P is soft so using low doses of beta blocker and Diltiazem.   Kerin Ransom PA-C Beeper [phone] 05/23/2014, 2:34 PM   Personally seen and examined. Agree with above. Difficult to control HR Will plan on TEE/CV tomorrow.  NPO past  midnight Hopefully will stay in NSR with recent PNA.  Patient agreeable.   Candee Furbish, MD

## 2014-05-23 NOTE — Anesthesia Preprocedure Evaluation (Addendum)
Anesthesia Evaluation  Patient identified by MRN, date of birth, ID band Patient awake    Reviewed: Allergy & Precautions, NPO status , Patient's Chart, lab work & pertinent test results, reviewed documented beta blocker date and time   Airway Mallampati: II   Neck ROM: Full    Dental  (+) Teeth Intact   Pulmonary former smoker (quit 08/2013),  breath sounds clear to auscultation        Cardiovascular + dysrhythmias Atrial Fibrillation Rhythm:Irregular  EF 65%   Neuro/Psych Seizures -,  Depression    GI/Hepatic   Endo/Other    Renal/GU      Musculoskeletal   Abdominal   Peds  Hematology   Anesthesia Other Findings   Reproductive/Obstetrics                            Anesthesia Physical Anesthesia Plan  ASA: III  Anesthesia Plan: MAC   Post-op Pain Management:    Induction:   Airway Management Planned: Nasal Cannula  Additional Equipment:   Intra-op Plan:   Post-operative Plan:   Informed Consent: I have reviewed the patients History and Physical, chart, labs and discussed the procedure including the risks, benefits and alternatives for the proposed anesthesia with the patient or authorized representative who has indicated his/her understanding and acceptance.     Plan Discussed with:   Anesthesia Plan Comments:         Anesthesia Quick Evaluation

## 2014-05-23 NOTE — Progress Notes (Signed)
Patient Demographics  [name], is a 64 y.o. male, DOB - November 26, 1950, MBW:[id number]  Admit date - 05/20/2014   Admitting Physician Collene Gobble, MD  Outpatient Primary MD for the patient is Merrilee Seashore, MD  LOS - 3   Chief Complaint  Patient presents with  . Seizures      Admission history of present illness/brief narrative: This is a 64 y.o. 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 He was apparently excessively sedated and he came obtunded requiring intubation for airway protection. Patient was successfully extubated, treated initially with azithromycin for suspected pneumonia, antibiotics were discontinued after he was found to have normal opacity on chest x-ray.as well he went into atrial fibrillation with rapid ventricular response. Patient is already on warfarin for DVT, Subjective:   Kincade Granberg today has, No headache, No chest pain, No abdominal pain - No Nausea, No new weakness tingling or numbness, No Cough - SOB.   Assessment & Plan    Principal Problem:   Seizures Active Problems:   Acute respiratory failure with hypoxia   Atrial fibrillation with rapid ventricular response  Acute respiratory failure - Secondary to altered mental status, resolved, patient was extubated 1/10.  Seizures - Neurology following. - No need for further workup as per neurology. - Continue with Keppra, Dilantin, gabapentin  A. fib with RVR - Patient is on her foot and for DVT with therapeutic INR. - Continue with telemetry - Heartrate is labile, but mainly on the higher side. - Continue with Cardizem 30 mg  every 8 hours as per cardiology recommendation - Possible need for DCCV as per cardiology prior to discharge. - Follow on Echo  History of DVT - On warfarin, therapeutic INR, pharmacy to dose.  Community-acquired pneumonia - Treated initially with azithromycin (as an out patient) and then transitioned to levofloxacin. Currently off antibiotics. - Repeat x-ray showed no consolidation  Code Status: Full  Family Communication: None at bedside  Disposition Plan: Remains in telemetry   Procedures  1/9 intubated in ED, extubated 1/10  Consults   Neurology PCCM Cardiology   Medications  Scheduled Meds: . diltiazem  30 mg Oral 3 times per day  . DULoxetine  60 mg Oral Daily  . gabapentin  600 mg Per Tube TID  . levETIRAcetam  1,500 mg Intravenous 3 times per day  . phenytoin (DILANTIN) IV  400 mg Intravenous QHS  . pravastatin  20 mg Oral q1800  . warfarin  7 mg Oral ONCE-1800  . Warfarin - Pharmacist Dosing Inpatient   Does not apply q1800   Continuous Infusions:  PRN Meds:.albuterol, fentaNYL, metoprolol  DVT Prophylaxis on warfarin  Lab Results  Component Value Date   PLT 148* 05/22/2014    Antibiotics   Anti-infectives    Start     Dose/Rate Route Frequency Ordered Stop   05/20/14 2200  levofloxacin (LEVAQUIN) IVPB 750 mg     750 mg100 mL/hr over 90 Minutes Intravenous Every 24 hours 05/20/14 2030 05/21/14 2341  Objective:   Filed Vitals:   05/22/14 2100 05/23/14 0100 05/23/14 0500 05/23/14 0918  BP: 103/67 111/73 109/57 100/58  Pulse: 85 70 50 113  Temp: 97.9 F (36.6 C) 98.2 F (36.8 C) 98.3 F (36.8 C) 98.2 F (36.8 C)  TempSrc: Oral Oral Oral Oral  Resp: 20 18 20 20   Height:      Weight:   128.867 kg (284 lb 1.6 oz)   SpO2: 98% 97% 97% 94%    Wt Readings from Last 3 Encounters:  05/23/14 128.867 kg (284 lb 1.6 oz)  03/31/14 127.007 kg (280 lb)  02/02/14 117.9 kg (259 lb 14.8 oz)     Intake/Output Summary (Last 24 hours) at  05/23/14 1311 Last data filed at 05/23/14 0738  Gross per 24 hour  Intake    480 ml  Output   1505 ml  Net  -1025 ml     Physical Exam  Awake Alert, Oriented X 3, No new F.N deficits, Normal affect [place].AT,PERRAL Supple Neck,No JVD, No cervical lymphadenopathy appriciated.  Symmetrical Chest wall movement, Good air movement bilaterally, CTAB RRR,No Gallops,Rubs or new Murmurs, No Parasternal Heave +ve B.Sounds, Abd Soft, No tenderness, No organomegaly appriciated, No rebound - guarding or rigidity. Has urostomy, left side abdominal healing wound. No Cyanosis, Clubbing or edema, No new Rash or bruise     Data Review   Micro Results Recent Results (from the past 240 hour(s))  MRSA PCR Screening     Status: None   Collection Time: 05/20/14  8:57 PM  Result Value Ref Range Status   MRSA by PCR NEGATIVE NEGATIVE Final    Comment:        The GeneXpert MRSA Assay (FDA approved for NASAL specimens only), is one component of a comprehensive MRSA colonization surveillance program. It is not intended to diagnose MRSA infection nor to guide or monitor treatment for MRSA infections.   Culture, blood (routine x 2)     Status: None (Preliminary result)   Collection Time: 05/20/14  9:00 PM  Result Value Ref Range Status   Specimen Description BLOOD RIGHT HAND  Final   Special Requests BOTTLES DRAWN AEROBIC AND ANAEROBIC 10CC  Final   Culture   Final           BLOOD CULTURE RECEIVED NO GROWTH TO DATE CULTURE WILL BE HELD FOR 5 DAYS BEFORE ISSUING A FINAL NEGATIVE REPORT Performed at [facility]    Report Status PENDING  Incomplete  Culture, blood (routine x 2)     Status: None (Preliminary result)   Collection Time: 05/20/14  9:10 PM  Result Value Ref Range Status   Specimen Description BLOOD RIGHT ARM  Final   Special Requests BOTTLES DRAWN AEROBIC AND ANAEROBIC 10CC  Final   Culture   Final           BLOOD CULTURE RECEIVED NO GROWTH TO DATE CULTURE WILL BE HELD FOR 5  DAYS BEFORE ISSUING A FINAL NEGATIVE REPORT Performed at [facility]    Report Status PENDING  Incomplete  Urine culture     Status: None   Collection Time: 05/21/14  6:10 AM  Result Value Ref Range Status   Specimen Description URINE, RANDOM  Final   Special Requests NONE  Final   Colony Count NO GROWTH Performed at [facility]   Final   Culture NO GROWTH Performed at [facility]   Final   Report Status 05/22/2014 FINAL  Final  Culture, respiratory (  tracheal aspirate)     Status: None   Collection Time: 05/21/14  9:56 AM  Result Value Ref Range Status   Specimen Description TRACHEAL ASPIRATE  Final   Special Requests NONE  Final   Gram Stain   Final    MODERATE WBC PRESENT, PREDOMINANTLY MONONUCLEAR RARE SQUAMOUS EPITHELIAL CELLS PRESENT RARE GRAM POSITIVE COCCI IN PAIRS Performed at [facility]    Culture   Final    Non-Pathogenic Oropharyngeal-type Flora Isolated. Performed at [facility]    Report Status 05/23/2014 FINAL  Final    Radiology Reports No results found.  CBC  Recent Labs Lab 05/17/14 0137 05/17/14 0206 05/20/14 1626 05/21/14 0550 05/22/14 0257  WBC 6.3  --  6.0 7.5 7.0  HGB 15.0 16.3 15.4 16.1 15.0  HCT 45.7 48.0 46.3 46.8 44.8  PLT 170  --  149* 130* 148*  MCV 94.0  --  92.8 91.8 94.7  MCH 30.9  --  30.9 31.6 31.7  MCHC 32.8  --  33.3 34.4 33.5  RDW 13.7  --  13.4 13.6 13.6  LYMPHSABS 1.6  --  1.0  --   --   MONOABS 0.6  --  0.5  --   --   EOSABS 0.3  --  0.2  --   --   BASOSABS 0.0  --  0.0  --   --     Chemistries   Recent Labs Lab 05/17/14 0137 05/17/14 0206 05/20/14 1626 05/21/14 0550  NA 139 140 136 139  K 4.3 4.2 4.3 3.9  CL 107 103 104 103  CO2 24  --  28 25  GLUCOSE 108* 112* 101* 85  BUN 10 13 9 8   CREATININE 0.66 0.70 0.55 0.60  CALCIUM 8.4  --  8.7 8.7  MG 2.0  --   --  1.9  AST 24  --  20  --   ALT 35  --  30  --   ALKPHOS 143*  --  151*  --   BILITOT 0.4  --  0.3   --    ------------------------------------------------------------------------------------------------------------------ estimated creatinine clearance is 140.3 mL/min (by C-G formula based on Cr of 0.6). ------------------------------------------------------------------------------------------------------------------ No results for input(s): HGBA1C in the last 72 hours. ------------------------------------------------------------------------------------------------------------------ No results for input(s): CHOL, HDL, LDLCALC, TRIG, CHOLHDL, LDLDIRECT in the last 72 hours. ------------------------------------------------------------------------------------------------------------------  Recent Labs  05/22/14 1256  TSH 1.667   ------------------------------------------------------------------------------------------------------------------ No results for input(s): VITAMINB12, FOLATE, FERRITIN, TIBC, IRON, RETICCTPCT in the last 72 hours.  Coagulation profile  Recent Labs Lab 05/17/14 0137 05/20/14 1626 05/21/14 0550 05/22/14 0257 05/23/14 0528  INR 2.15* 2.31* 2.69* 3.03* 2.62*    No results for input(s): DDIMER in the last 72 hours.  Cardiac Enzymes  Recent Labs Lab 05/21/14 0550 05/22/14 1256 05/23/14 0528  TROPONINI <0.03 <0.03 0.03   ------------------------------------------------------------------------------------------------------------------ Invalid input(s): POCBNP     Time Spent in minutes   30 minutes   Kateland Leisinger M.D on 05/23/2014 at 1:11 PM  Between 7am to 7pm - Pager - [phone]  After 7pm go to [email or web address] - password [email or web address]  And look for the night coverage person covering for me after hours  [facility] Office  [phone]   **Disclaimer: This note may have been dictated with voice recognition software. Similar sounding words can inadvertently be transcribed and this note may contain transcription errors which may  not have been corrected upon publication of note.**

## 2014-05-23 NOTE — Progress Notes (Signed)
  Echocardiogram 2D Echocardiogram has been performed.  Johny Chess 05/23/2014, 9:38 AM

## 2014-05-24 ENCOUNTER — Encounter (HOSPITAL_COMMUNITY)
Admission: EM | Disposition: A | Discharge status: Home Health | Payer: Self-pay | Source: Home / Self Care | Attending: Emergency Medicine

## 2014-05-24 ENCOUNTER — Inpatient Hospital Stay (HOSPITAL_COMMUNITY): Payer: Medicare Other | Admitting: Anesthesiology

## 2014-05-24 ENCOUNTER — Encounter (HOSPITAL_COMMUNITY): Payer: Self-pay | Admitting: *Deleted

## 2014-05-24 DIAGNOSIS — I4891 Unspecified atrial fibrillation: Secondary | ICD-10-CM

## 2014-05-24 HISTORY — PX: CARDIOVERSION: SHX1299

## 2014-05-24 HISTORY — PX: TEE WITHOUT CARDIOVERSION: SHX5443

## 2014-05-24 LAB — BASIC METABOLIC PANEL
Anion gap: 4 — ABNORMAL LOW (ref 5–15)
BUN: 11 mg/dL (ref 6–23)
CHLORIDE: 106 meq/L (ref 96–112)
CO2: 25 mmol/L (ref 19–32)
Calcium: 8.2 mg/dL — ABNORMAL LOW (ref 8.4–10.5)
Creatinine, Ser: 0.57 mg/dL (ref 0.50–1.35)
GFR calc non Af Amer: >90 mL/min (ref >90)
Glucose, Bld: 88 mg/dL (ref 70–99)
POTASSIUM: 4 mmol/L (ref 3.5–5.1)
Sodium: 135 mmol/L (ref 135–145)

## 2014-05-24 LAB — CBC
HEMATOCRIT: 43.9 % (ref 39.0–52.0)
Hemoglobin: 14.9 g/dL (ref 13.0–17.0)
MCH: 31.8 pg (ref 26.0–34.0)
MCHC: 33.9 g/dL (ref 30.0–36.0)
MCV: 93.6 fL (ref 78.0–100.0)
PLATELETS: 150 10*3/uL (ref 150–400)
RBC: 4.69 MIL/uL (ref 4.22–5.81)
RDW: 13.6 % (ref 11.5–15.5)
WBC: 7.4 10*3/uL (ref 4.0–10.5)

## 2014-05-24 LAB — PROTIME-INR
INR: 2.44 — AB (ref 0.00–1.49)
PROTHROMBIN TIME: 26.7 s — AB (ref 11.6–15.2)

## 2014-05-24 SURGERY — ECHOCARDIOGRAM, TRANSESOPHAGEAL
Anesthesia: Monitor Anesthesia Care

## 2014-05-24 MED ORDER — PROPOFOL INFUSION 10 MG/ML OPTIME
INTRAVENOUS | Status: DC | PRN
  Administered 2014-05-24: 100 ug/kg/min via INTRAVENOUS

## 2014-05-24 MED ORDER — FENTANYL CITRATE 0.05 MG/ML IJ SOLN
INTRAMUSCULAR | Status: DC | PRN
  Administered 2014-05-24: 25 ug via INTRAVENOUS

## 2014-05-24 MED ORDER — PHENYTOIN SODIUM EXTENDED 100 MG PO CAPS: 400.0000 mg | ORAL_CAPSULE | Freq: Every day | ORAL | Status: DC

## 2014-05-24 MED ORDER — LACTATED RINGERS IV SOLN
INTRAVENOUS | Status: DC
  Administered 2014-05-24: 1000 mL via INTRAVENOUS

## 2014-05-24 MED ORDER — DILTIAZEM HCL ER COATED BEADS 180 MG PO CP24: 180.0000 mg | ORAL_CAPSULE | Freq: Every day | ORAL | Status: DC

## 2014-05-24 MED ORDER — WARFARIN SODIUM 6 MG PO TABS
7.0000 mg | ORAL_TABLET | Freq: Once | ORAL | Status: AC
  Administered 2014-05-24: 7 mg via ORAL
  Filled 2014-05-24: qty 1

## 2014-05-24 MED ORDER — MIDAZOLAM HCL 5 MG/5ML IJ SOLN
INTRAMUSCULAR | Status: DC | PRN
  Administered 2014-05-24: 1 mg via INTRAVENOUS

## 2014-05-24 MED ORDER — ONDANSETRON HCL 4 MG/2ML IJ SOLN
INTRAMUSCULAR | Status: DC | PRN
  Administered 2014-05-24: 4 mg via INTRAVENOUS

## 2014-05-24 NOTE — Anesthesia Postprocedure Evaluation (Signed)
  Anesthesia Post-op Note  Patient: [name]  Procedure(s) Performed: Procedure(s): TRANSESOPHAGEAL ECHOCARDIOGRAM (TEE) (N/A) CARDIOVERSION (N/A)  Patient Location: PACU  Anesthesia Type:MAC  Level of Consciousness: awake  Airway and Oxygen Therapy: Patient Spontanous Breathing and Patient connected to nasal cannula oxygen  Post-op Pain: none  Post-op Assessment: Post-op Vital signs reviewed  Post-op Vital Signs: Reviewed and stable  Last Vitals:  Filed Vitals:   05/24/14 1340  BP: 112/72  Pulse: 87  Temp:   Resp: 12    Complications: No apparent anesthesia complications

## 2014-05-24 NOTE — CV Procedure (Signed)
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

## 2014-05-24 NOTE — Progress Notes (Signed)
ANTICOAGULATION CONSULT NOTE - Follow Up Consult  Pharmacy Consult for Coumadin Indication: History of DVT, new afib  Allergies  Allergen Reactions  . Penicillins     Mother and brother have had severe allergic reactions, no personal reaction    Patient Measurements: Height: 6\' 3"  (190.5 cm) Weight: 282 lb 12.8 oz (128.277 kg) IBW/kg (Calculated) : 84.5 Vital Signs: Temp: 97.6 F (36.4 C) (01/13 0627) Temp Source: Oral (01/13 0627) BP: 125/63 mmHg (01/13 0627) Pulse Rate: 68 (01/13 0627) Labs:  Recent Labs  05/22/14 0257 05/22/14 1256 05/23/14 0528 05/24/14 0340  HGB 15.0  --   --  14.9  HCT 44.8  --   --  43.9  PLT 148*  --   --  150  LABPROT 31.6*  --  28.2* 26.7*  INR 3.03*  --  2.62* 2.44*  CREATININE  --   --   --  0.57  TROPONINI  --  <0.03 0.03  --     Estimated Creatinine Clearance: 139.9 mL/min (by C-G formula based on Cr of 0.57).  Medications:  PTA- Coumadin 7 mg po daily  Assessment: 64 year old male admitted with seizures and acute respiratory failure on chronic coumadin for history of DVT. INR remains therapeutic at 2.44. H/H and platelets are WNL. No bleeding noted. Pt was previously on levaquin which may have increased INR but now off and INR is stabilizing.   Goal of Therapy:  INR 2-3  Plan:  1. Warfarin 7mg  PO x 1 tonight (normal home dose) 2. F/u AM INR  Salome Arnt, PharmD, BCPS Pager # [phone] 05/24/2014 8:02 AM

## 2014-05-24 NOTE — Progress Notes (Signed)
  Echocardiogram 2D Echocardiogram has been performed.  [name] 05/24/2014, 1:36 PM

## 2014-05-24 NOTE — Transfer of Care (Signed)
Immediate Anesthesia Transfer of Care Note  Patient: [name]  Procedure(s) Performed: Procedure(s): TRANSESOPHAGEAL ECHOCARDIOGRAM (TEE) (N/A) CARDIOVERSION (N/A)  Patient Location: PACU  Anesthesia Type:MAC  Level of Consciousness: awake, alert  and oriented  Airway & Oxygen Therapy: Patient Spontanous Breathing and Patient connected to nasal cannula oxygen  Post-op Assessment: Report given to PACU RN, Post -op Vital signs reviewed and stable and Patient moving all extremities X 4  Post vital signs: Reviewed and stable  Complications: No apparent anesthesia complications

## 2014-05-24 NOTE — Progress Notes (Signed)
Pt returned from Endo with no noted distress. Report called in to this nurse. Pt received alert, verbal. Neuro intact. Able to follow commands and move all extremities. No complaints of sore throat. He denies pain or discomfort. HOB elevated. Call bell within reach. Wife at bedside.

## 2014-05-24 NOTE — Anesthesia Procedure Notes (Signed)
Procedure Name: MAC Date/Time: 05/24/2014 1:05 PM Performed by: Carola Frost Pre-anesthesia Checklist: Patient identified, Timeout performed, Emergency Drugs available, Suction available and Patient being monitored Patient Re-evaluated:Patient Re-evaluated prior to inductionOxygen Delivery Method: Nasal cannula Intubation Type: IV induction Placement Confirmation: positive ETCO2 and breath sounds checked- equal and bilateral Dental Injury: Teeth and Oropharynx as per pre-operative assessment

## 2014-05-24 NOTE — Progress Notes (Signed)
Pt discharging at this time via stretcher via [facility] transport. Alert, verbal with no noted distress. IV's discontinued. Ostomy to RUQ changed emptied 200 of green liquid. Dressing to colostomy to LUQ clean, dry and intact. Wound to abdomen changed and is clean, dry and intact.  Discharge instructions and education provided with verbal understanding. Pt and family (sister) aware of follow up appts. Prescriptions given to pt's sister per pt request to fill at pharmacy along with all personal belongings to take home.

## 2014-05-24 NOTE — Progress Notes (Signed)
Patient is active with [facility] for HHPT/ RN; Jeannetta Nap with [facility] called and made aware of discharge for todayAneta Mins [phone]

## 2014-05-24 NOTE — Anesthesia Postprocedure Evaluation (Signed)
  Anesthesia Post-op Note  Patient: [name]  Procedure(s) Performed: Procedure(s): TRANSESOPHAGEAL ECHOCARDIOGRAM (TEE) (N/A) CARDIOVERSION (N/A)  Patient Location: Endoscopy Unit  Anesthesia Type:MAC  Level of Consciousness: awake, alert  and oriented  Airway and Oxygen Therapy: Patient Spontanous Breathing and Patient connected to nasal cannula oxygen  Post-op Pain: none  Post-op Assessment: Post-op Vital signs reviewed, Patient's Cardiovascular Status Stable, Respiratory Function Stable, Patent Airway and No signs of Nausea or vomiting  Post-op Vital Signs: Reviewed and stable  Last Vitals:  Filed Vitals:   05/24/14 1231  BP: 126/69  Pulse:   Temp: 36.4 C  Resp: 10    Complications: No apparent anesthesia complications

## 2014-05-24 NOTE — Discharge Instructions (Signed)
Follow with Bruce Seashore, MD in 5-7 days  Please get a complete blood count and chemistry panel checked by your Primary MD at your next visit, and again as instructed by your Primary MD. Please get your medications reviewed and adjusted by your Primary MD.  Please request your Primary MD to go over all Hospital Tests and Procedure/Radiological results at the follow up, please get all Hospital records sent to your Prim MD by signing hospital release before you go home.  If you had Pneumonia of Lung problems at the Hospital: Please get a 2 view Chest X ray done in 6-8 weeks after hospital discharge or sooner if instructed by your Primary MD.  If you have Congestive Heart Failure: Please call your Cardiologist or Primary MD anytime you have any of the following symptoms:  1) 3 pound weight gain in 24 hours or 5 pounds in 1 week  2) shortness of breath, with or without a dry hacking cough  3) swelling in the hands, feet or stomach  4) if you have to sleep on extra pillows at night in order to breathe  Follow cardiac low salt diet and 1.5 lit/day fluid restriction.  If you have diabetes Accuchecks 4 times/day, Once in AM empty stomach and then before each meal. Log in all results and show them to your primary doctor at your next visit. If any glucose reading is under 80 or above 300 call your primary MD immediately.  If you have Seizure/Convulsions/Epilepsy: Please do not drive, operate heavy machinery, participate in activities at heights or participate in high speed sports until you have seen by Primary MD or a Neurologist and advised to do so again.  If you had Gastrointestinal Bleeding: Please ask your Primary MD to check a complete blood count within one week of discharge or at your next visit. Your endoscopic/colonoscopic biopsies that are pending at the time of discharge, will also need to followed by your Primary MD.  Get Medicines reviewed and adjusted. Please take all your  medications with you for your next visit with your Primary MD  Please request your Primary MD to go over all hospital tests and procedure/radiological results at the follow up, please ask your Primary MD to get all Hospital records sent to his/her office.  If you experience worsening of your admission symptoms, develop shortness of breath, life threatening emergency, suicidal or homicidal thoughts you must seek medical attention immediately by calling 911 or calling your MD immediately  if symptoms less severe.  You must read complete instructions/literature along with all the possible adverse reactions/side effects for all the Medicines you take and that have been prescribed to you. Take any new Medicines after you have completely understood and accpet all the possible adverse reactions/side effects.   Do not drive or operate heavy machinery when taking Pain medications.   Do not take more than prescribed Pain, Sleep and Anxiety Medications  Special Instructions: If you have smoked or chewed Tobacco  in the last 2 yrs please stop smoking, stop any regular Alcohol  and or any Recreational drug use.  Wear Seat belts while driving.  Please note You were cared for by a hospitalist during your hospital stay. If you have any questions about your discharge medications or the care you received while you were in the hospital after you are discharged, you can call the unit and asked to speak with the hospitalist on call if the hospitalist that took care of you is not available. Once you  are discharged, your primary care physician will handle any further medical issues. Please note that NO REFILLS for any discharge medications will be authorized once you are discharged, as it is imperative that you return to your primary care physician (or establish a relationship with a primary care physician if you do not have one) for your aftercare needs so that they can reassess your need for medications and monitor your  lab values.  You can reach the hospitalist office at phone [phone] or fax [phone]   If you do not have a primary care physician, you can call [phone] for a physician referral.  Activity: As tolerated with Full fall precautions use walker/cane & assistance as needed  Diet: heart healthy  Disposition Home

## 2014-05-24 NOTE — Interval H&P Note (Signed)
History and Physical Interval Note:  05/24/2014 8:15 AM  [name]  has presented today for surgery, with the diagnosis of A FIB   The various methods of treatment have been discussed with the patient and family. After consideration of risks, benefits and other options for treatment, the patient has consented to  Procedure(s): TRANSESOPHAGEAL ECHOCARDIOGRAM (TEE) (N/A) CARDIOVERSION (N/A) as a surgical intervention .  The patient's history has been reviewed, patient examined, no change in status, stable for surgery.  I have reviewed the patient's chart and labs.  Questions were answered to the patient's satisfaction.     Jenkins Rouge

## 2014-05-24 NOTE — Progress Notes (Signed)
Nutrition Brief Note  Patient identified on the Malnutrition Screening Tool (MST) Report  Wt Readings from Last 15 Encounters:  05/24/14 282 lb 12.8 oz (128.277 kg)  03/31/14 280 lb (127.007 kg)  02/02/14 259 lb 14.8 oz (117.9 kg)  12/30/13 285 lb (129.275 kg)  11/25/13 264 lb (119.75 kg)  10/14/13 278 lb (126.1 kg)  04/18/13 267 lb 12.8 oz (121.473 kg)  02/16/13 265 lb (120.203 kg)  12/16/12 259 lb 12.8 oz (117.845 kg)  11/17/12 259 lb (117.482 kg)  10/06/12 259 lb (117.482 kg)  03/26/12 255 lb (115.667 kg)    Body mass index is 35.35 kg/(m^2). Patient meets criteria for Obesity based on current BMI.   Current diet order is NPO for procedure. From 1/10 through 1/12 pt was on a Carb Modified diet and consuming approximately 100% of meals at this time. Labs and medications reviewed.   No nutrition interventions warranted at this time. If nutrition issues arise, please consult RD.   Pryor Ochoa RD, LDN Inpatient Clinical Dietitian Pager: [phone] After Hours Pager: [phone]

## 2014-05-24 NOTE — Progress Notes (Addendum)
Subjective:  Sleeping post cardioversion  Objective:  Vital Signs in the last 24 hours: Temp:  [97.6 F (36.4 C)-98.6 F (37 C)] 97.6 F (36.4 C) (01/13 1231) Pulse Rate:  [49-109] 70 (01/13 1044) Resp:  [10-20] 10 (01/13 1231) BP: (95-132)/(55-72) 126/69 mmHg (01/13 1231) SpO2:  [92 %-98 %] 96 % (01/13 1231) Weight:  [282 lb 12.8 oz (128.277 kg)] 282 lb 12.8 oz (128.277 kg) (01/13 0500)  Intake/Output from previous day:  Intake/Output Summary (Last 24 hours) at 05/24/14 1333 Last data filed at 05/24/14 1040  Gross per 24 hour  Intake    240 ml  Output    675 ml  Net   -435 ml    Physical Exam: General appearance: sleepy post conversion no distress Neck: no JVD Lungs: decreased breath sounds Heart: reg rhythm   Rate: 80-110  Rhythm: atrial fibrillation prior, Now NSR  Lab Results:  Recent Labs  05/22/14 0257 05/24/14 0340  WBC 7.0 7.4  HGB 15.0 14.9  PLT 148* 150    Recent Labs  05/24/14 0340  NA 135  K 4.0  CL 106  CO2 25  GLUCOSE 88  BUN 11  CREATININE 0.57    Recent Labs  05/22/14 1256 05/23/14 0528  TROPONINI <0.03 0.03    Recent Labs  05/24/14 0340  INR 2.44*    Imaging: Imaging results have been reviewed  Cardiac Studies: Echo 05/23/14  Study Conclusions  - Left ventricle: The cavity size was normal. There was mild concentric hypertrophy. Systolic function was vigorous. The estimated ejection fraction was in the range of 65% to 70%. Wall motion was normal; there were no regional wall motion abnormalities. The study was not technically sufficient to allow evaluation of LV diastolic dysfunction due to atrial fibrillation. - Aortic valve: Trileaflet; normal thickness leaflets. There was no regurgitation. - Aortic root: The aortic root was normal in size. - Mitral valve: Structurally normal valve. There was no regurgitation. - Left atrium: The atrium was mildly dilated. - Right ventricle: Systolic function  was normal. - Right atrium: The atrium was normal in size. - Tricuspid valve: There was mild regurgitation. - Pulmonic valve: There was no regurgitation. - Pulmonary arteries: Systolic pressure was within the normal range. - Inferior vena cava: The vessel was normal in size. - Pericardium, extracardiac: There was no pericardial effusion.  Impressions:  - Limited quality study, but there appears to be normal biventricular size and systolic function. No regional wall motion abnormalities. Mild tricuspid regurgitation.  Assessment/Plan:  63 y.o. male with a past medical history significant for seizure disorder, DVT on Coumadin "for years". He denies any history of CAD, MI, or cath. 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 He has a history of seizures and chronic Dilantin use. He presented with progressive seizures. He was apparently excessively sedated and he came obtunded requiring intubation for airway protection. He was also found to have a pneumonia. On the morning of 05/22/14 he went into atrial fibrillation with rapid ventricular response.  Principal Problem:   Seizures Active Problems:   History of DVT (deep vein thrombosis)   Hyperlipidemia   Acute respiratory failure with hypoxia   Atrial fibrillation with rapid ventricular response   Chronic anticoagulation  TEE/CV successful.   Hopefully will stay in NSR with recent PNA.   Continue coumadin  OK to change diltiazem to CD 180 QD  If stable from medical standpoint, would be ok with DC later today.  Candee Furbish, MD

## 2014-05-25 ENCOUNTER — Encounter (HOSPITAL_COMMUNITY): Payer: Self-pay | Admitting: Cardiovascular Disease

## 2014-05-25 LAB — LEVETIRACETAM LEVEL: LEVETIRACETAM: 41.6 ug/mL

## 2014-05-25 NOTE — Discharge Summary (Signed)
Physician Discharge Summary  [name] LDJ:[id number] DOB: [birth year] DOA: 05/20/2014  PCP: Bruce Seashore, MD  Admit date: 05/20/2014 Discharge date: 05/25/2014  Time spent: 45 minutes  Recommendations for Outpatient Follow-up:  1. Follow up with PCP in 1-2 weeks 2. Follow up with Dr. Jabier [name] in 2 weeks 3. Follow up with Dr. Marlou [name] in 2 weeks   Discharge Diagnoses:  Principal Problem:   Seizures Active Problems:   History of DVT (deep vein thrombosis)   Hyperlipidemia   Acute respiratory failure with hypoxia   Atrial fibrillation with rapid ventricular response   Chronic anticoagulation   Discharge Condition: stable  Diet recommendation: heart healthy  Filed Weights   05/22/14 1530 05/23/14 0500 05/24/14 0500  Weight: 128.005 kg (282 lb 3.2 oz) 128.867 kg (284 lb 1.6 oz) 128.277 kg (282 lb 12.8 oz)    History of present illness:  64 yo man with MMP that include seizure disorder, DVT's, CAD, transvere and 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 Having focal seizures with worsening control over the last 4 days. He was seen in ED 1/6 and Keppra changed to dilantin. He was also treated for PNA at that visit. Unfortunately he has continued to have progressive seizures, to point of persistent post-ictal state without recovery. He was brought to the ED 1/9 and given ativan aggressively to achieve control. He became obtunded and required intubation for airway protection. Unclear whether he was taking the dilantin or not. PCCM will admit.   Hospital Course:  Acute respiratory failure - Secondary to altered mental status in the setting of seizure disorder, resolved, patient was extubated 1/10, stable on room air Seizures- Continue with Keppra, Dilantin, his dilantin dose was increased to 400 mg nightly and patient was seizure free 72h  prior to discharge A. fib with RVR - Patient is on anticoagulation for DVT with therapeutic INR; Cardiology was consulted  and underwent TEE and cardioversion on 1/13 after which he returned and remained in sinus rhythm. Cardiology cleared him for discharge following the procedure and he will be started on diltiazem.  History of DVT - On warfarin, therapeutic INR, pharmacy to dose. Community-acquired pneumonia - Treated initially with azithromycin (as an out patient) and then transitioned to levofloxacin for a total of 5 days. Currently off antibiotics and stable. - Repeat x-ray showed no consolidation  Procedures:  TEE with cardioversion 1/13   Consultations:  Cardiology   Neurology   Discharge Exam: Filed Vitals:   05/24/14 1350 05/24/14 1355 05/24/14 1421 05/24/14 1700  BP: 131/76 134/79 126/78 125/81  Pulse: 85 80 85 87  Temp:   97.8 F (36.6 C) 98.4 F (36.9 C)  TempSrc:   Oral Oral  Resp: 15 15 18 18   Height:      Weight:      SpO2: 96% 95% 97% 99%    General: NAD Cardiovascular: RRR Respiratory: CTA biL  Discharge Instructions     Medication List    STOP taking these medications        azithromycin 250 MG tablet  Commonly known as:  ZITHROMAX     enoxaparin 120 MG/0.8ML injection  Commonly known as:  LOVENOX      TAKE these medications        acetaminophen 500 MG tablet  Commonly known as:  TYLENOL  Take 1 tablet (500 mg total) by mouth 2 (two) times daily as needed for mild pain or fever.     Calcium-Vitamin D 600-400 MG-UNIT Tabs  Take 1 tablet by mouth daily. For calcium supplement     diltiazem 180 MG 24 hr capsule  Commonly known as:  CARDIZEM CD  Take 1 capsule (180 mg total) by mouth daily.     DULoxetine 60 MG capsule  Commonly known as:  CYMBALTA  Take 60 mg by mouth daily.     Gabapentin (Once-Daily) 600 MG Tabs  Gralise 600 mg in AM and 1200 mg in PM. Po     gabapentin 600 MG tablet  Commonly known as:  NEURONTIN  Take 1 tablet (600 mg total) by mouth 3 (three) times daily.     HYDROcodone-acetaminophen 5-325 MG per tablet  Commonly known as:   NORCO/VICODIN  Take 1-2 tablets by mouth every 6 (six) hours as needed for moderate pain or severe pain.     levETIRAcetam 750 MG tablet  Commonly known as:  KEPPRA  Take 2 tablets (1,500 mg total) by mouth 3 (three) times daily. For seizures     LORazepam 1 MG tablet  Commonly known as:  ATIVAN  Use 1 mg tab to break focal seizure activity. As needed, do not exceed 2 mg a day.     multivitamin with minerals Tabs tablet  Take 1 tablet by mouth daily.     phenytoin 100 MG ER capsule  Commonly known as:  DILANTIN  Take 4 capsules (400 mg total) by mouth at bedtime.     pravastatin 20 MG tablet  Commonly known as:  PRAVACHOL  Take 20 mg by mouth daily. For HLD     tamsulosin 0.4 MG Caps capsule  Commonly known as:  FLOMAX  Take 0.4 mg by mouth daily. For BPH     UNABLE TO FIND  Med Name: [name] Plus Powder.  For use around colostomy     vitamin B-12 1000 MCG tablet  Commonly known as:  CYANOCOBALAMIN  Take 1,000 mcg by mouth daily. For supplement     warfarin 5 MG tablet  Commonly known as:  COUMADIN  Take 5 mg by mouth every evening. Take with 2mg  tablet for a total dose of 7mg  DAILY     warfarin 2 MG tablet  Commonly known as:  COUMADIN  Take 2 mg by mouth every evening. Take with 5mg  tablet for a total dose of 7mg  DAILY           Follow-up Information    Follow up with [facility], MD. Schedule an appointment as soon as possible for a visit in 2 weeks.   Specialty:  Internal Medicine   Contact information:   [address] [place] [address] [phone]       Follow up with [facility], MD. Schedule an appointment as soon as possible for a visit in 2 weeks.   Specialty:  Neurology   Contact information:   [address] [place] [address] [phone]       Follow up with Candee Furbish, MD. Schedule an appointment as soon as possible for a visit in 2 weeks.   Specialty:  Cardiology   Contact information:    [address] [address] [phone]       Follow up with [facility].   Why:  they will provide your home health care as prior to hospitalization with nursing and physical therapy   Contact information:   [address] [place] [address] [phone]       The results  of significant diagnostics from this hospitalization (including imaging, microbiology, ancillary and laboratory) are listed below for reference.    Significant Diagnostic Studies: Dg Chest 2 View  05/17/2014   CLINICAL DATA:  Acute onset of seizures. Right-sided abdominal pain. Initial encounter.  EXAM: CHEST  2 VIEW  COMPARISON:  Chest radiograph from 01/29/2014  FINDINGS: The lungs are well-aerated. Right basilar airspace opacity may reflect atelectasis or pneumonia. Mild vascular congestion is noted. No pleural effusion or pneumothorax is seen.  The heart is normal in size; the mediastinal contour is within normal limits. No acute osseous abnormalities are seen.  IMPRESSION: Right basilar airspace opacity may reflect atelectasis or pneumonia. Mild vascular congestion noted.   Electronically Signed   By: Garald Balding M.D.   On: 05/17/2014 02:25   Ct Head Wo Contrast  05/20/2014   CLINICAL DATA:  Seizures, patient had seizure in CT  EXAM: CT HEAD WITHOUT CONTRAST  TECHNIQUE: Contiguous axial images were obtained from the base of the skull through the vertex without intravenous contrast.  COMPARISON:  MRI brain 12140 for  FINDINGS: There is no evidence of mass effect, midline shift, or extra-axial fluid collections. There is no evidence of a space-occupying lesion or intracranial hemorrhage. There is no evidence of a cortical-based area of acute infarction. There is generalized cerebral atrophy. There is periventricular white matter low attenuation likely secondary to microangiopathy.  The ventricles and sulci are appropriate for the patient's age. The basal cisterns are  patent.  Visualized portions of the orbits are unremarkable. Is near complete opacification of bilateral maxillary sinuses, ethmoid sinuses and frontal sinus cyst. There is mild mucosal thickening of the left sphenoid sinus with an air-fluid level.  The osseous structures are unremarkable.  IMPRESSION: 1. No acute intracranial pathology. 2. Pansinus disease.   Electronically Signed   By: Kathreen Devoid   On: 05/20/2014 17:23   Dg Chest Port 1 View  05/21/2014   CLINICAL DATA:  Acute respiratory failure/hypoxia  EXAM: PORTABLE CHEST - 1 VIEW  COMPARISON:  May 20, 2014  FINDINGS: Endotracheal tube tip is 4.3 cm above the carina. Nasogastric tube tip and side port are below the diaphragm. No pneumothorax. There is a small amount of fluid tracking in the right minor fissure. There is no edema or consolidation. Heart size and pulmonary vascularity are normal. No adenopathy. No bone lesions.  IMPRESSION: Small right effusion. No edema or consolidation. Tube positions as described without pneumothorax.   Electronically Signed   By: Lowella Grip M.D.   On: 05/21/2014 07:34   Dg Chest Port 1 View  05/20/2014   CLINICAL DATA:  64 year old intubated male patient.  EXAM: PORTABLE CHEST - 1 VIEW  COMPARISON:  Chest x-ray 05/17/2014.  FINDINGS: Interval placement of endotracheal tube with tip 4.4 cm above the carina. Nasogastric tube in position, occult on the proximal stomach. Lung volumes are very low. Study is limited by lack of visualization of the lower left hemithorax. With these limitations in mind, there are patchy areas of interstitial prominence and ill-defined airspace disease in the lungs bilaterally, favored to reflect evolving multilobar bronchopneumonia, most apparent in the perihilar aspect of the right lung and in the left upper lobe. No definite pleural effusions. No evidence of pulmonary edema. Heart size is borderline enlarged. The patient is rotated to the left on today's exam, resulting in  distortion of the mediastinal contours and reduced diagnostic sensitivity and specificity for mediastinal pathology.  IMPRESSION: 1. Support apparatus, as above. 2. Patchy  multifocal interstitial and airspace opacities in lungs bilaterally, concerning for multilobar bronchopneumonia.   Electronically Signed   By: Vinnie Langton M.D.   On: 05/20/2014 19:07    Microbiology: Recent Results (from the past 240 hour(s))  MRSA PCR Screening     Status: None   Collection Time: 05/20/14  8:57 PM  Result Value Ref Range Status   MRSA by PCR NEGATIVE NEGATIVE Final    Comment:        The GeneXpert MRSA Assay (FDA approved for NASAL specimens only), is one component of a comprehensive MRSA colonization surveillance program. It is not intended to diagnose MRSA infection nor to guide or monitor treatment for MRSA infections.   Culture, blood (routine x 2)     Status: None (Preliminary result)   Collection Time: 05/20/14  9:00 PM  Result Value Ref Range Status   Specimen Description BLOOD RIGHT HAND  Final   Special Requests BOTTLES DRAWN AEROBIC AND ANAEROBIC 10CC  Final   Culture   Final           BLOOD CULTURE RECEIVED NO GROWTH TO DATE CULTURE WILL BE HELD FOR 5 DAYS BEFORE ISSUING A FINAL NEGATIVE REPORT Performed at [facility]    Report Status PENDING  Incomplete  Culture, blood (routine x 2)     Status: None (Preliminary result)   Collection Time: 05/20/14  9:10 PM  Result Value Ref Range Status   Specimen Description BLOOD RIGHT ARM  Final   Special Requests BOTTLES DRAWN AEROBIC AND ANAEROBIC 10CC  Final   Culture   Final           BLOOD CULTURE RECEIVED NO GROWTH TO DATE CULTURE WILL BE HELD FOR 5 DAYS BEFORE ISSUING A FINAL NEGATIVE REPORT Performed at [facility]    Report Status PENDING  Incomplete  Urine culture     Status: None   Collection Time: 05/21/14  6:10 AM  Result Value Ref Range Status   Specimen Description URINE, RANDOM  Final   Special  Requests NONE  Final   Colony Count NO GROWTH Performed at [facility]   Final   Culture NO GROWTH Performed at [facility]   Final   Report Status 05/22/2014 FINAL  Final  Culture, respiratory (tracheal aspirate)     Status: None   Collection Time: 05/21/14  9:56 AM  Result Value Ref Range Status   Specimen Description TRACHEAL ASPIRATE  Final   Special Requests NONE  Final   Gram Stain   Final    MODERATE WBC PRESENT, PREDOMINANTLY MONONUCLEAR RARE SQUAMOUS EPITHELIAL CELLS PRESENT RARE GRAM POSITIVE COCCI IN PAIRS Performed at [facility]    Culture   Final    Non-Pathogenic Oropharyngeal-type Flora Isolated. Performed at [facility]    Report Status 05/23/2014 FINAL  Final     Labs: Basic Metabolic Panel:  Recent Labs Lab 05/20/14 1626 05/21/14 0550 05/24/14 0340  NA 136 139 135  K 4.3 3.9 4.0  CL 104 103 106  CO2 28 25 25   GLUCOSE 101* 85 88  BUN 9 8 11   CREATININE 0.55 0.60 0.57  CALCIUM 8.7 8.7 8.2*  MG  --  1.9  --   PHOS  --  3.6  --    Liver Function Tests:  Recent Labs Lab 05/20/14 1626 05/22/14 0257  AST 20  --   ALT 30  --   ALKPHOS 151*  --   BILITOT 0.3  --  PROT 6.3  --   ALBUMIN 3.2* 2.9*   CBC:  Recent Labs Lab 05/20/14 1626 05/21/14 0550 05/22/14 0257 05/24/14 0340  WBC 6.0 7.5 7.0 7.4  NEUTROABS 4.3  --   --   --   HGB 15.4 16.1 15.0 14.9  HCT 46.3 46.8 44.8 43.9  MCV 92.8 91.8 94.7 93.6  PLT 149* 130* 148* 150   Cardiac Enzymes:  Recent Labs Lab 05/21/14 0550 05/22/14 1256 05/23/14 0528  TROPONINI <0.03 <0.03 0.03   BNP: BNP (last 3 results)  Recent Labs  10/19/13 1630  PROBNP 79.0   CBG:  Recent Labs Lab 05/20/14 2355 05/21/14 0350 05/21/14 0830 05/21/14 1247  GLUCAP 97 89 72 88       Signed:  GHERGHE, COSTIN  [facility] 05/25/2014, 1:09 PM

## 2014-05-27 LAB — CULTURE, BLOOD (ROUTINE X 2)
CULTURE: NO GROWTH
CULTURE: NO GROWTH

## 2014-06-04 ENCOUNTER — Other Ambulatory Visit: Payer: Self-pay | Admitting: Neurology

## 2014-06-15 ENCOUNTER — Ambulatory Visit (INDEPENDENT_AMBULATORY_CARE_PROVIDER_SITE_OTHER): Payer: Medicare Other | Admitting: Physician Assistant

## 2014-06-15 ENCOUNTER — Encounter: Payer: Self-pay | Admitting: Physician Assistant

## 2014-06-15 VITALS — BP 150/98 | HR 57

## 2014-06-15 DIAGNOSIS — I82409 Acute embolism and thrombosis of unspecified deep veins of unspecified lower extremity: Secondary | ICD-10-CM | POA: Insufficient documentation

## 2014-06-15 DIAGNOSIS — IMO0001 Reserved for inherently not codable concepts without codable children: Secondary | ICD-10-CM

## 2014-06-15 DIAGNOSIS — I480 Paroxysmal atrial fibrillation: Secondary | ICD-10-CM

## 2014-06-15 DIAGNOSIS — R030 Elevated blood-pressure reading, without diagnosis of hypertension: Secondary | ICD-10-CM

## 2014-06-15 DIAGNOSIS — Z86718 Personal history of other venous thrombosis and embolism: Secondary | ICD-10-CM

## 2014-06-15 DIAGNOSIS — E785 Hyperlipidemia, unspecified: Secondary | ICD-10-CM

## 2014-06-15 DIAGNOSIS — Z7901 Long term (current) use of anticoagulants: Secondary | ICD-10-CM

## 2014-06-15 DIAGNOSIS — Q211 Atrial septal defect: Secondary | ICD-10-CM | POA: Insufficient documentation

## 2014-06-15 DIAGNOSIS — Q2112 Patent foramen ovale: Secondary | ICD-10-CM | POA: Insufficient documentation

## 2014-06-15 DIAGNOSIS — E669 Obesity, unspecified: Secondary | ICD-10-CM | POA: Insufficient documentation

## 2014-06-15 MED ORDER — DILTIAZEM HCL ER COATED BEADS 180 MG PO CP24: 180.0000 mg | ORAL_CAPSULE | Freq: Every day | ORAL | Status: DC

## 2014-06-15 NOTE — Patient Instructions (Signed)
Your physician recommends that you continue on your current medications as directed. Please refer to the Current Medication list given to you today.  A refill for diltiazem has been sent to your pharmacy  Call the office if your BP is consistently elevated at home  Your physician recommends that you schedule a follow-up appointment in: 3 months with Dr.Hilty

## 2014-06-15 NOTE — Progress Notes (Signed)
Cardiology Office Note Date:  06/15/2014  Patient ID:  Bruce, [name] 1951-01-18, MRN [id number] PCP:  Bruce Seashore, MD  Cardiologist:  [name]  Chief Complaint: follow-up hospital visit for atrial fibrillation  History of Present Illness: [name] is a 64 y.o. male with history of seizure disorder, recurrent DVTs, bowel perforation s/p surgery 12/2013, HLD, venous stasis, obesity and recently diagnosed AF s/p DCCV who presents for post-hospital follow-up.  He was admitted 1/9-1/14/16 with focal seizures, obtundation requiring intubation for airway protection, and CAP. Per notes, no history of CAD, MI, or cath. During that admission he was noted to develop new onset AF RVR on 1/11. Rates were difficult to control due to soft BPs limiting med titration. He underwent TEE/DCCV on 05/24/14 with successful conversion to NSR. (TEE: Normal EF 60%, Atrial septal aneurysm with likely small PFO bubble study not done, Normal MV, Mild AV sclerosis, Mild LAE, Normal RV, Mild TR, Mild mural aortic debris, No LAA thrombus). He was discharged on diltiazem. His PCP follows his INR.  He is doing well since discharge. He is back home. His sister is his primary caregiver. He required ambulance transport to the office due to chronic severely limited mobility. However, he has been working slowly with PT. No CP, palpitations, dyspnea, orthopnea. BP is elevated in the office, however, it is checked regularly at home by the nurse and PT and generally runs 712W systolic. No bleeding issues on warfarin.  Past Medical History  Diagnosis Date  . Depression   . Seizures   . Deep vein thrombophlebitis of left leg   . Deep vein thrombosis of right lower extremity   . Psoriasis   . Arthritis   . DVT (deep venous thrombosis)     multiple times  . Weakness of both legs   . Peripheral neuropathy   . Venous stasis   . Hyperplasia of prostate   . Hyperlipidemia   . Atrial fibrillation     a. Dx 05/2014 in  setting of seizure, PNA. S/p TEE/DCCV.  Marland Kitchen PFO (patent foramen ovale)     a. 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  . Bowel perforation     a. transvere and R colectomy and ileostomy in 12/25/13.  . Obesity     Past Surgical History  Procedure Laterality Date  . Colonoscopy  03/26/2012    Procedure: COLONOSCOPY;  Surgeon: Beryle Beams, MD;  Location: [facility];  Service: Endoscopy;  Laterality: N/A;  . Tonsillectomy      as child  . Flexible sigmoidoscopy N/A 12/09/2013    Procedure: FLEXIBLE SIGMOIDOSCOPY;  Surgeon: Beryle Beams, MD;  Location: [facility];  Service: Endoscopy;  Laterality: N/A;  . Laparotomy N/A 12/25/2013    Procedure: EXPLORATORY LAPAROTOMY/Transverse and right coloectomy/ end  ileostomy and mucus  fistula;  Surgeon: Ralene Ok, MD;  Location: [place];  Service: General;  Laterality: N/A;  . Ankle fracture surgery  09/04/13  . Colostomy  12-25-13    Ruptured colon  . Tee without cardioversion N/A 05/24/2014    Procedure: TRANSESOPHAGEAL ECHOCARDIOGRAM (TEE);  Surgeon: Josue Hector, MD;  Location: [facility] ENDOSCOPY;  Service: Cardiovascular;  Laterality: N/A;  . Cardioversion N/A 05/24/2014    Procedure: CARDIOVERSION;  Surgeon: Josue Hector, MD;  Location: [facility] ENDOSCOPY;  Service: Cardiovascular;  Laterality: N/A;    Current Outpatient Prescriptions  Medication Sig Dispense Refill  . acetaminophen (TYLENOL) 500 MG tablet Take 1 tablet (500  mg total) by mouth 2 (two) times daily as needed for mild pain or fever. 30 tablet 0  . Calcium Carb-Cholecalciferol (CALCIUM-VITAMIN D) 600-400 MG-UNIT TABS Take 1 tablet by mouth daily. For calcium supplement    . diltiazem (CARDIZEM CD) 180 MG 24 hr capsule Take 1 capsule (180 mg total) by mouth daily. 30 capsule 1  . DULoxetine (CYMBALTA) 60 MG capsule Take 60 mg by mouth daily.    Marland Kitchen gabapentin (NEURONTIN) 600 MG tablet TAKE 1 TABLET (600 MG TOTAL) BY MOUTH 3 (THREE) TIMES DAILY.  90 tablet 3  . Gabapentin, Once-Daily, 600 MG TABS Gralise 600 mg in AM and 1200 mg in PM. Po 90 tablet 5  . HYDROcodone-acetaminophen (NORCO/VICODIN) 5-325 MG per tablet Take 1-2 tablets by mouth every 6 (six) hours as needed for moderate pain or severe pain. 20 tablet 0  . levETIRAcetam (KEPPRA) 750 MG tablet Take 2 tablets (1,500 mg total) by mouth 3 (three) times daily. For seizures 270 tablet 3  . LORazepam (ATIVAN) 1 MG tablet Use 1 mg tab to break focal seizure activity. As needed, do not exceed 2 mg a day. 30 tablet 0  . Multiple Vitamin (MULTIVITAMIN WITH MINERALS) TABS Take 1 tablet by mouth daily.    . phenytoin (DILANTIN) 100 MG ER capsule Take 4 capsules (400 mg total) by mouth at bedtime. 270 capsule 3  . pravastatin (PRAVACHOL) 20 MG tablet Take 20 mg by mouth daily. For HLD    . tamsulosin (FLOMAX) 0.4 MG CAPS capsule Take 0.4 mg by mouth daily. For BPH    . UNABLE TO FIND Med Name: [name] Powder.  For use around colostomy    . vitamin B-12 (CYANOCOBALAMIN) 1000 MCG tablet Take 1,000 mcg by mouth daily. For supplement    . warfarin (COUMADIN) 2 MG tablet Take 2 mg by mouth every evening. Take with 5mg  tablet for a total dose of 7mg  DAILY    . warfarin (COUMADIN) 5 MG tablet Take 5 mg by mouth every evening. Take with 2mg  tablet for a total dose of 7mg  DAILY     No current facility-administered medications for this visit.    Allergies:   Penicillins   Social History:  The patient  reports that he quit smoking about 10 months ago. His smoking use included Pipe. He does not have any smokeless tobacco history on file. He reports that he does not drink alcohol or use illicit drugs.   Family History:  The patient's family history includes Cancer in his father and mother; Dementia in his mother; Diabetes in his brother, father, mother, and sister; Heart disease in his brother, father, and mother; Hyperlipidemia in his sister; Hypertension in his father, mother, and sister; Kidney  disease in his brother; Vision loss in his father.   ROS:  Please see the history of present illness.  All other systems are reviewed and otherwise negative.   PHYSICAL EXAM:  VS:  BP 150/98 mmHg  Pulse 57  SpO2 99% BMI: There is no weight on file to calculate BMI. Well nourished, well developed, in no acute distress - laying flat in bed HEENT: normocephalic, atraumatic Neck: no JVD Cardiac:  normal S1, S2; RRR; no murmur Lungs:  clear to auscultation bilaterally, no wheezing, rhonchi or rales Abd: soft, nontender, no hepatomegaly Ext: no edema Skin: warm and dry Neuro:  moves all extremities spontaneously, no focal abnormalities noted  EKG:  NSR 57bpm no acute ST-T changes  Recent Labs: 10/19/2013: Pro B Natriuretic  peptide (BNP) 79.0 05/20/2014: ALT 30 05/21/2014: Magnesium 1.9 05/22/2014: TSH 1.667 05/24/2014: BUN 11; Creatinine 0.57; Hemoglobin 14.9; Platelets 150; Potassium 4.0; Sodium 135  10/17/2013: Cholesterol, Total 136; HDL Cholesterol by NMR 45; LDL (calc) 78; Triglycerides 65   CrCl cannot be calculated (Unknown ideal weight.).   Wt Readings from Last 3 Encounters:  05/24/14 282 lb 12.8 oz (128.277 kg)  03/31/14 280 lb (127.007 kg)  02/02/14 259 lb 14.8 oz (117.9 kg)     Other studies reviewed: Additional studies/records reviewed today include:  Full 2D Echo 05/23/14: EF 65-70%, mild LVH, mild LAE, mild TR. Hospital labs with generally unremarkable BMET/CBC, normal TSH. 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 Newly diagnosed atrial fibrillation s/p recent DCCV - maintaining NSR. Continue diltiazem. Will refill. His CHADSVASC score is actually 0 at present time (BP elevated today but family notes this is an outlier). He is on Coumadin for h/o DVTs. Will observe for recurrence of symptoms. He does snore but his sister is pretty adamant that she has never observed apnea - says she is a poor sleeper so she keeps a  close watch on him. I offered consideration of evaluation for sleep apnea but they would like to defer at this time. 2. H/o DVT on chronic anticoagulation - INR recently elevated, being followed closely by home health communicating with PCP. 3. Hyperlipidemia - continue statin. 4. Elevated blood pressure - he and his sister both state this is an outlier. They will follow closely at home and notify if any recurrent elevated readings.  Disposition: F/u with Dr. Debara [name] in 3 months.  Current medicines are reviewed at length with the patient today.  The patient did not have any concerns regarding medicines.  Raechel Ache PA-C 06/15/2014 1:48 PM     [address] [place] [address] [phone] (office)  [phone] (fax)

## 2014-06-19 ENCOUNTER — Encounter (HOSPITAL_BASED_OUTPATIENT_CLINIC_OR_DEPARTMENT_OTHER): Payer: Medicare Other | Attending: Plastic Surgery

## 2014-06-19 DIAGNOSIS — X58XXXA Exposure to other specified factors, initial encounter: Secondary | ICD-10-CM | POA: Insufficient documentation

## 2014-06-19 DIAGNOSIS — S31102S Unspecified open wound of abdominal wall, epigastric region without penetration into peritoneal cavity, sequela: Secondary | ICD-10-CM | POA: Diagnosis present

## 2014-06-22 ENCOUNTER — Telehealth: Payer: Self-pay | Admitting: Cardiology

## 2014-06-22 NOTE — Telephone Encounter (Signed)
Left message for [name] that according to the notes - pt is supposed to be on Diltiazem 180 mg a day unless it has been stopped by someone for some reason.  Requested she call back.  Pt actually has an appt with Dr [name].

## 2014-06-22 NOTE — Telephone Encounter (Signed)
New message     Pt c/o BP issue: STAT if pt c/o blurred vision, one-sided weakness or slurred speech  1. What are your last 5 BP readings? Today 150/90; yesterday 140/80 and 158/86  2. Are you having any other symptoms (ex. Dizziness, headache, blurred vision, passed out)? no  3. What is your BP issue? Not on bp meds but bp is going up

## 2014-06-27 NOTE — Telephone Encounter (Signed)
Spoke with Beth who reports the patient didn't know he was supposed to be taking Diltiazem but he is on it now.  BP today was better 150/84.  She will call back if further issues or concerns.

## 2014-06-27 NOTE — Telephone Encounter (Signed)
Left another message to call back to discuss medications if needed.

## 2014-07-03 DIAGNOSIS — S31102S Unspecified open wound of abdominal wall, epigastric region without penetration into peritoneal cavity, sequela: Secondary | ICD-10-CM | POA: Diagnosis not present

## 2014-07-05 ENCOUNTER — Ambulatory Visit (INDEPENDENT_AMBULATORY_CARE_PROVIDER_SITE_OTHER): Payer: Medicare Other | Admitting: Neurology

## 2014-07-05 ENCOUNTER — Encounter: Payer: Self-pay | Admitting: Neurology

## 2014-07-05 VITALS — BP 120/90 | HR 72 | Resp 16 | Ht 77.0 in | Wt 280.0 lb

## 2014-07-05 DIAGNOSIS — G40A01 Absence epileptic syndrome, not intractable, with status epilepticus: Secondary | ICD-10-CM

## 2014-07-05 DIAGNOSIS — G40909 Epilepsy, unspecified, not intractable, without status epilepticus: Secondary | ICD-10-CM | POA: Insufficient documentation

## 2014-07-05 DIAGNOSIS — G5793 Unspecified mononeuropathy of bilateral lower limbs: Secondary | ICD-10-CM

## 2014-07-05 DIAGNOSIS — G839 Paralytic syndrome, unspecified: Secondary | ICD-10-CM

## 2014-07-05 DIAGNOSIS — G5792 Unspecified mononeuropathy of left lower limb: Secondary | ICD-10-CM

## 2014-07-05 DIAGNOSIS — G8220 Paraplegia, unspecified: Secondary | ICD-10-CM

## 2014-07-05 DIAGNOSIS — G5791 Unspecified mononeuropathy of right lower limb: Secondary | ICD-10-CM

## 2014-07-05 MED ORDER — PHENYTOIN SODIUM EXTENDED 100 MG PO CAPS: 400.0000 mg | ORAL_CAPSULE | Freq: Every day | ORAL | Status: DC

## 2014-07-05 MED ORDER — GABAPENTIN (ONCE-DAILY) 600 MG PO TABS: ORAL_TABLET | ORAL | Status: DC

## 2014-07-05 MED ORDER — LEVETIRACETAM 750 MG PO TABS: 1500.0000 mg | ORAL_TABLET | Freq: Three times a day (TID) | ORAL | Status: DC

## 2014-07-05 NOTE — Progress Notes (Signed)
Provider:  [name], M D  Referring Provider: Merrilee Seashore, MD Primary Care Physician:  Merrilee Seashore, MD  Chief Complaint  Patient presents with  . Seizures    RM - [address] came by ambulance - sister is with him  . Peripheral Neuropathy    HPI:  MAHER SHON is a 64 y.o. male , presenting on a stretcher ,  seen here as a referral  from Dr. Ashby Dawes for  new evaluation of  Epilepsy,  Neuropathy .   This patient has followed with Dr Erling Cruz prior to his retirement and was last seen 02-11-2011.  Dr. love described Mr. 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 His head would turn to the left repeatedly , he did not lose awareness.  The patient was placed on Dilantin brand name under milligrams 3 times a day alternating with 100 mg 4 times a day every other day. He was also on Keppra in the generic form of levetiracetam 1500 mg 3 times a day. He had been tried on topiramate but it caused him to have a stomach upset. Since the patient has severe neuropathy and chronic venostasis he has also been unsteady on his feet and a high fall risk was assessed.  He has psoriasis and easily bruises he fell in his home and broke his left ankle in a fall in February 2012 . he now fell , stumbling over his walker and broke his right ankle April 2015,  he spent a month in rehabilitation, developed a megacolon, he has a urinary catheter - Foley infected -he had to be readmitted with urosepsis, he has a colostomy bag which also has let to skin lacerations while in a facility  not adequately cared for.  His sister seems to be the one to change and since she  Provides  skin care he has no repeated infections, the folwy has been replaced.  The last lab level was obtained through Dr. Erling Cruz on 11-19-2010 with a normal CBC complex metabolic panel and a phenytoin level of 6. He also ordered a DEXA scan last  2008 and that was normal. A normal bone density test from March 2013 was normal -according to Dr. Mathis Fare note . He has progressive numbness in his legs and feet but weaning him off Dilantin was not successful and increased his seizure frequency. It is likely that his neuropathy is partially caused by Dilantin.  Interval history on 2-20 4-16. Mr. Hansley was admitted in status epilepticus on 05/17/2014 to [facility] and followed by Dr. Cruzita Lederer,  He underwent multiple multiple brain scans which did not show an intracranial abnormality it was also noted that he was unable to swallow any medications during his status and therefore received Dilantin and Keppra intravenously. He was intubated for a couple of days. The status epilepticus was broken by being given IV medications. He is back on Dilantin and I have increased that in his last visit to 400 mg at night instead of alternating 300 with 400 we will resume this today. I refill his Keppra today as well as his Neurontin which I further increased by 600 mg. Neurontin also an antiepileptic medication is used in his case to treat his significant sensory abnormalities related to a neuropathy. What is remarkable to me is that the patient has almost no control over his lower extremities and lost all muscle tone by his upper extremities  provide good grip strength and normal movement and tone. A nerve conduction study and EMG with Dr. Jannifer Franklin just documented neuropathy. The admission to the hospital was precipitated by developing pneumonia. The CT scan of the head was reviewed and the report is copied to today's note.  CLINICAL DATA: Seizures, patient had seizure in CT  EXAM: CT HEAD WITHOUT CONTRAST  TECHNIQUE: Contiguous axial images were obtained from the base of the skull through the vertex without intravenous contrast.  COMPARISON: MRI brain 12140 for  FINDINGS: There is no evidence of mass effect, midline shift, or  extra-axial fluid collections. There is no evidence of a space-occupying lesion or intracranial hemorrhage. There is no evidence of a cortical-based area of acute infarction. There is generalized cerebral atrophy. There is periventricular white matter low attenuation likely secondary to microangiopathy.  The ventricles and sulci are appropriate for the patient's age. The basal cisterns are patent.  Visualized portions of the orbits are unremarkable. Is near complete opacification of bilateral maxillary sinuses, ethmoid sinuses and frontal sinus cyst. There is mild mucosal thickening of the left sphenoid sinus with an air-fluid level.  The osseous structures are unremarkable.  IMPRESSION: 1. No acute intracranial pathology. 2. Pansinus disease.   Electronically Signed  By: Kathreen Devoid  On: 05/20/2014 17:23   I have discussed with the patient today that I would like for him to undergo a thoracic spine MRI if possible his last brain MRI by my records is from 2004, ordered by Dr. Jannifer Franklin at the time. In addition I do not need a brain MRI at this point I think that it is clear that the pneumonia cost him to have the seizures which is not an unknown or uncommon trigger. Any infection below the seizure threshold. What I would like to research further why his lower extremities are disproportionately affected by a neuropathy.  Review of Systems: Out of a complete 14 system review, the patient complains of only the following symptoms, and all other reviewed systems are negative.  severe numbness to all modalities, poor circulation, no clots, no DVT now, on anticoagulation.   Unable to ambulate Resides in [place].   History   Social History  . Marital Status: Single    Spouse Name: N/A  . Number of Children: 0  . Years of Education: HS   Occupational History  . Not on file.   Social History Main Topics  . Smoking status: Former Smoker    Types: Pipe    Quit date:  08/10/2013  . Smokeless tobacco: Never Used  . Alcohol Use: No  . Drug Use: No  . Sexual Activity: Not on file   Other Topics Concern  . Not on file   Social History Narrative   Patient is single and lives with his sister.   Patient is retired from [employer].   Patient has a high school education.   Patient drinks one cup of caffeine daily.   Patient is right-handed.          Family History  Problem Relation Age of Onset  . Vision loss Father   . Hypertension Father   . Diabetes Father   . Heart disease Father   . Cancer Father     colon  . Diabetes Mother   . Hypertension Mother   . Heart disease Mother   . Dementia Mother   . Cancer Mother     breast ca  . Diabetes Sister   . Hyperlipidemia Sister   .  Hypertension Sister   . Diabetes Brother   . Kidney disease Brother   . Heart disease Brother     Past Medical History  Diagnosis Date  . Depression   . Seizures   . Deep vein thrombophlebitis of left leg   . Deep vein thrombosis of right lower extremity   . Psoriasis   . Arthritis   . DVT (deep venous thrombosis)     multiple times  . Weakness of both legs   . Peripheral neuropathy   . Venous stasis   . Hyperplasia of prostate   . Hyperlipidemia   . Atrial fibrillation     a. Dx 05/2014 in setting of seizure, PNA. S/p TEE/DCCV.  Marland Kitchen PFO (patent foramen ovale)     a. 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  . Bowel perforation     a. transvere and R colectomy and ileostomy in 12/25/13.  . Obesity     Past Surgical History  Procedure Laterality Date  . Colonoscopy  03/26/2012    Procedure: COLONOSCOPY;  Surgeon: Beryle Beams, MD;  Location: [facility];  Service: Endoscopy;  Laterality: N/A;  . Tonsillectomy      as child  . Flexible sigmoidoscopy N/A 12/09/2013    Procedure: FLEXIBLE SIGMOIDOSCOPY;  Surgeon: Beryle Beams, MD;  Location: [facility];  Service: Endoscopy;  Laterality: N/A;  . Laparotomy N/A  12/25/2013    Procedure: EXPLORATORY LAPAROTOMY/Transverse and right coloectomy/ end  ileostomy and mucus  fistula;  Surgeon: Ralene Ok, MD;  Location: [place];  Service: General;  Laterality: N/A;  . Ankle fracture surgery  09/04/13  . Colostomy  12-25-13    Ruptured colon  . Tee without cardioversion N/A 05/24/2014    Procedure: TRANSESOPHAGEAL ECHOCARDIOGRAM (TEE);  Surgeon: Josue Hector, MD;  Location: [facility] ENDOSCOPY;  Service: Cardiovascular;  Laterality: N/A;  . Cardioversion N/A 05/24/2014    Procedure: CARDIOVERSION;  Surgeon: Josue Hector, MD;  Location: [facility] ENDOSCOPY;  Service: Cardiovascular;  Laterality: N/A;    Current Outpatient Prescriptions  Medication Sig Dispense Refill  . acetaminophen (TYLENOL) 500 MG tablet Take 1 tablet (500 mg total) by mouth 2 (two) times daily as needed for mild pain or fever. 30 tablet 0  . Calcium Carb-Cholecalciferol (CALCIUM-VITAMIN D) 600-400 MG-UNIT TABS Take 1 tablet by mouth daily. For calcium supplement    . diltiazem (CARDIZEM CD) 180 MG 24 hr capsule Take 1 capsule (180 mg total) by mouth daily. 30 capsule 11  . DULoxetine (CYMBALTA) 60 MG capsule Take 60 mg by mouth daily.    Marland Kitchen gabapentin (NEURONTIN) 600 MG tablet 600 mg. 4 times a day   3  . Gabapentin, Once-Daily, 600 MG TABS Gralise 600 mg in AM and 600 mg at lunch  1200 mg in PM. Po 120 tablet 5  . levETIRAcetam (KEPPRA) 750 MG tablet Take 2 tablets (1,500 mg total) by mouth 3 (three) times daily. For seizures 270 tablet 3  . LORazepam (ATIVAN) 1 MG tablet Use 1 mg tab to break focal seizure activity. As needed, do not exceed 2 mg a day. 30 tablet 0  . Multiple Vitamin (MULTIVITAMIN WITH MINERALS) TABS Take 1 tablet by mouth daily.    . phenytoin (DILANTIN) 100 MG ER capsule Take 4 capsules (400 mg total) by mouth at bedtime. 360 capsule 3  . pravastatin (PRAVACHOL) 20 MG tablet Take 20 mg by mouth daily. For HLD    . tamsulosin (FLOMAX) 0.4  MG CAPS capsule Take 0.4 mg by mouth daily.  For BPH    . UNABLE TO FIND Med Name: [name] Powder.  For use around colostomy    . vitamin B-12 (CYANOCOBALAMIN) 1000 MCG tablet Take 1,000 mcg by mouth daily. For supplement    . warfarin (COUMADIN) 5 MG tablet Take 5 mg by mouth every evening. Take with 2mg  tablet for a total dose of 7mg  DAILY     No current facility-administered medications for this visit.    Allergies as of 07/05/2014 - Review Complete 06/15/2014  Allergen Reaction Noted  . Penicillins  03/26/2012    Vitals: BP 120/90 mmHg  Pulse 72  Resp 16  Ht 6\' 5"  (1.956 m)  Wt 280 lb (127.007 kg)  BMI 33.20 kg/m2 Last Weight:  Wt Readings from Last 1 Encounters:  07/05/14 280 lb (127.007 kg)   Last Height:   Ht Readings from Last 1 Encounters:  07/05/14 6\' 5"  (1.956 m)    Physical exam:  General: The patient is awake, alert and appears not in acute distress. The patient is well groomed. Head: Normocephalic, atraumatic. Neck is supple. He is examined in a supine position.  Mallampati 3, neck circumference: 16 Cardiovascular:  Regular rate and rhythm , without  murmurs or carotid bruit, and without distended neck veins. Respiratory: Lungs are  Crackling, not wheezing. Left side clear to auscultation. Abdominal pouch , colostoma.  Skin:  Without evidence of edema, or rash Trunk: BMI is elevated and patient  has normal posture.  Neurologic exam : The patient is awake and alert, oriented to place and time.  Memory subjective described as intact.  There is a normal attention span & concentration ability. Speech is fluent without dysarthria, dysphonia or aphasia.  Mood and affect are appropriate.  Cranial nerves: Pupils are equal and briskly reactive to light.  Extraocular movements in vertical and horizontal planes intact and without nystagmus.  Visual fields by finger perimetry are intact. Hearing to finger rub intact.  Facial sensation intact to fine touch.  Facial motor strength is symmetric and tongue  and uvula move midline. Tongue protrusion into either cheek is normal. Shoulder shrug is normal.   Motor exam:   Weakness of dorsiflexion and toe movements, but the  grip is preserved.  No cogwheeling.  He is almost completely paralyzed form the groin down. Can wiggle his toes, but cannot provide resistance.   Sensory:  Fine touch, pinprick and vibration were tested and are absent in knee and below.  Coordination: Rapid alternating movements in the fingers/hands were normal.  Finger-to-nose maneuver  With  evidence of ataxia, dysmetria or tremor.  Gait and station: Patient on stretcher.  Deep tendon reflexes: in the upper and lower extremities are absent, as is babinski .    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  Profound neuropathy, hearing loss, loss of muscle tone in both lower extremities. Lost all hair on the lower extremities and has shiny skin.        Plan:  Treatment plan and additional workup :  absence Epilepsy treatment on 3 medications. Neuropathy treatment on one of the epilepsy medications.  EMG and NCV , is this axonal and demyelinating neuropathy?  Left unanswered , just advanced stage of sensory and motor , painful neuropathy,  Deconditioning after bed rest.  Needs  to start to PT, OT , and continue for the next 10 month. Change to Gralise or Horizon for longer acting pain control.       Asencion Partridge Cottrell Gentles MD 07/05/2014

## 2014-07-24 ENCOUNTER — Encounter (HOSPITAL_BASED_OUTPATIENT_CLINIC_OR_DEPARTMENT_OTHER): Payer: Medicare Other | Attending: Plastic Surgery

## 2014-07-24 ENCOUNTER — Ambulatory Visit
Admission: RE | Admit: 2014-07-24 | Discharge: 2014-07-24 | Disposition: A | Discharge status: Home / Self Care | Payer: Medicare Other | Source: Ambulatory Visit | Attending: Neurology | Admitting: Neurology

## 2014-07-24 DIAGNOSIS — Y833 Surgical operation with formation of external stoma as the cause of abnormal reaction of the patient, or of later complication, without mention of misadventure at the time of the procedure: Secondary | ICD-10-CM | POA: Insufficient documentation

## 2014-07-24 DIAGNOSIS — S31109D Unspecified open wound of abdominal wall, unspecified quadrant without penetration into peritoneal cavity, subsequent encounter: Secondary | ICD-10-CM | POA: Insufficient documentation

## 2014-07-24 DIAGNOSIS — G8220 Paraplegia, unspecified: Secondary | ICD-10-CM

## 2014-07-24 DIAGNOSIS — G839 Paralytic syndrome, unspecified: Secondary | ICD-10-CM | POA: Diagnosis not present

## 2014-07-24 DIAGNOSIS — G40A01 Absence epileptic syndrome, not intractable, with status epilepticus: Secondary | ICD-10-CM

## 2014-07-24 DIAGNOSIS — T8189XD Other complications of procedures, not elsewhere classified, subsequent encounter: Secondary | ICD-10-CM | POA: Insufficient documentation

## 2014-07-24 DIAGNOSIS — G5793 Unspecified mononeuropathy of bilateral lower limbs: Secondary | ICD-10-CM

## 2014-07-24 DIAGNOSIS — G40909 Epilepsy, unspecified, not intractable, without status epilepticus: Secondary | ICD-10-CM

## 2014-07-24 MED ORDER — GADOBENATE DIMEGLUMINE 529 MG/ML IV SOLN
20.0000 mL | Freq: Once | INTRAVENOUS | Status: AC | PRN
  Administered 2014-07-24: 20 mL via INTRAVENOUS

## 2014-07-31 DIAGNOSIS — Y833 Surgical operation with formation of external stoma as the cause of abnormal reaction of the patient, or of later complication, without mention of misadventure at the time of the procedure: Secondary | ICD-10-CM | POA: Diagnosis not present

## 2014-07-31 DIAGNOSIS — S31109D Unspecified open wound of abdominal wall, unspecified quadrant without penetration into peritoneal cavity, subsequent encounter: Secondary | ICD-10-CM | POA: Diagnosis not present

## 2014-07-31 DIAGNOSIS — T8189XD Other complications of procedures, not elsewhere classified, subsequent encounter: Secondary | ICD-10-CM | POA: Diagnosis present

## 2014-08-02 ENCOUNTER — Telehealth: Payer: Self-pay | Admitting: Neurology

## 2014-08-02 DIAGNOSIS — G609 Hereditary and idiopathic neuropathy, unspecified: Secondary | ICD-10-CM

## 2014-08-02 NOTE — Telephone Encounter (Signed)
I'm not sure that we have any other options for better neuropathic pain control especially since part of his pain is in the rectal area. I will refer him for a pain management consult.

## 2014-08-02 NOTE — Telephone Encounter (Signed)
I relayed the MRI results which had been viewed by his family member in my chart.  He is stating the gralise 600mg  po am , noon and 1200mg  pm has not made a difference in his pain.   He is having leg and rectal pain.   He is asking for something for pain.

## 2014-08-02 NOTE — Telephone Encounter (Signed)
I called pt and relayed the message below, did relay that referral already faxed to Dr. Niel Hummer.  Pt verbalized understanding.

## 2014-08-02 NOTE — Telephone Encounter (Signed)
Patient is calling to get MRI results and also patient wants to know if there is anything he can take for the pain in his legs and rectum. Please call to [facility] on [address]. Thank you.

## 2014-08-11 ENCOUNTER — Telehealth: Payer: Self-pay | Admitting: Neurology

## 2014-08-11 NOTE — Telephone Encounter (Signed)
I called [facility].  According to message they are "experiencing high call volume".  I was on hold for over 20 minutes with no answer. Finally was able to reach an agent.  They said policy changed and a prior Josem Kaufmann is needed.  I provided all clinical info and was abe to get this med approved effective until 08/11/2015 Ref # [id number].  I called Ms Pyon back.  She is aware.

## 2014-08-11 NOTE — Telephone Encounter (Signed)
Patient's sister, Maan Zarcone @ [phone], stated [facility] requesting additional information as to why Rx GRALISE is being prescribed.  Patient only has enough to last for weekend.  Please call and advise.

## 2014-08-21 ENCOUNTER — Telehealth: Payer: Self-pay | Admitting: Internal Medicine

## 2014-08-21 ENCOUNTER — Encounter (HOSPITAL_BASED_OUTPATIENT_CLINIC_OR_DEPARTMENT_OTHER): Payer: Medicare Other | Attending: Plastic Surgery

## 2014-08-21 DIAGNOSIS — Y833 Surgical operation with formation of external stoma as the cause of abnormal reaction of the patient, or of later complication, without mention of misadventure at the time of the procedure: Secondary | ICD-10-CM | POA: Insufficient documentation

## 2014-08-21 DIAGNOSIS — Z933 Colostomy status: Secondary | ICD-10-CM | POA: Insufficient documentation

## 2014-08-21 DIAGNOSIS — S31109D Unspecified open wound of abdominal wall, unspecified quadrant without penetration into peritoneal cavity, subsequent encounter: Secondary | ICD-10-CM | POA: Diagnosis not present

## 2014-08-21 DIAGNOSIS — T8189XD Other complications of procedures, not elsewhere classified, subsequent encounter: Secondary | ICD-10-CM | POA: Diagnosis not present

## 2014-08-21 DIAGNOSIS — E1169 Type 2 diabetes mellitus with other specified complication: Secondary | ICD-10-CM | POA: Insufficient documentation

## 2014-08-21 NOTE — Telephone Encounter (Signed)
Received records from [facility] for appointment on 10/03/14 with Dr Debara Pickett.  Records given to [facility] (medical records) for Dr Lysbeth Penner schedule on 10/03/14.  lp

## 2014-08-25 ENCOUNTER — Encounter: Payer: Self-pay | Admitting: Neurology

## 2014-08-26 ENCOUNTER — Other Ambulatory Visit: Payer: Self-pay

## 2014-08-26 MED ORDER — LEVETIRACETAM 750 MG PO TABS: 1500.0000 mg | ORAL_TABLET | Freq: Three times a day (TID) | ORAL | Status: DC

## 2014-08-26 NOTE — Telephone Encounter (Signed)
90 day Rx requested

## 2014-09-11 ENCOUNTER — Encounter (HOSPITAL_BASED_OUTPATIENT_CLINIC_OR_DEPARTMENT_OTHER): Payer: Medicare Other | Attending: Plastic Surgery

## 2014-09-11 DIAGNOSIS — S31102D Unspecified open wound of abdominal wall, epigastric region without penetration into peritoneal cavity, subsequent encounter: Secondary | ICD-10-CM | POA: Insufficient documentation

## 2014-09-11 DIAGNOSIS — Y838 Other surgical procedures as the cause of abnormal reaction of the patient, or of later complication, without mention of misadventure at the time of the procedure: Secondary | ICD-10-CM | POA: Insufficient documentation

## 2014-09-29 ENCOUNTER — Telehealth: Payer: Self-pay

## 2014-09-29 NOTE — Telephone Encounter (Signed)
PT had a question about getting his liver functions (Blood test) done while he is here since he has to come by stretcher...please call and advise

## 2014-09-29 NOTE — Telephone Encounter (Signed)
Spoke to pt. Informed him that we should be able to draw his blood for him on his appt Tuesday. Pt verbalized understanding.

## 2014-10-03 ENCOUNTER — Ambulatory Visit (INDEPENDENT_AMBULATORY_CARE_PROVIDER_SITE_OTHER): Payer: Medicare Other | Admitting: Neurology

## 2014-10-03 ENCOUNTER — Ambulatory Visit: Payer: Medicare Other | Admitting: Internal Medicine

## 2014-10-03 ENCOUNTER — Encounter: Payer: Self-pay | Admitting: Neurology

## 2014-10-03 VITALS — BP 124/70 | HR 86 | Resp 20

## 2014-10-03 DIAGNOSIS — G8220 Paraplegia, unspecified: Secondary | ICD-10-CM | POA: Diagnosis not present

## 2014-10-03 DIAGNOSIS — G40109 Localization-related (focal) (partial) symptomatic epilepsy and epileptic syndromes with simple partial seizures, not intractable, without status epilepticus: Secondary | ICD-10-CM

## 2014-10-03 DIAGNOSIS — B999 Unspecified infectious disease: Secondary | ICD-10-CM

## 2014-10-03 DIAGNOSIS — G63 Polyneuropathy in diseases classified elsewhere: Secondary | ICD-10-CM

## 2014-10-03 MED ORDER — LORAZEPAM 1 MG PO TABS: ORAL_TABLET | ORAL | Status: DC

## 2014-10-03 MED ORDER — LEVETIRACETAM 750 MG PO TABS: 1500.0000 mg | ORAL_TABLET | Freq: Three times a day (TID) | ORAL | Status: DC

## 2014-10-03 NOTE — Addendum Note (Signed)
Addended by: Lester Hayden Lake A on: 10/03/2014 04:03 PM   Modules accepted: Orders

## 2014-10-03 NOTE — Progress Notes (Signed)
Provider:  [name], M D  Referring Provider: Merrilee Seashore, MD Primary Care Physician:  Merrilee Seashore, MD  Chief Complaint  Patient presents with  . Follow-up    seizures, rm 69, with sister    HPI:  [name] is a 64 y.o. male, presenting on a stretcher, seen here as a referral from Dr. Ashby Dawes for new evaluation of  Epilepsy, Neuropathy .   This patient has followed with Dr Erling Cruz prior to his retirement and was last seen 02-11-2011.  Dr.Love described 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 His head would turn to the left repeatedly , he did not lose awareness.  The patient was placed on Dilantin brand name under milligrams 3 times a day alternating with 100 mg 4 times a day every other day. He was also on Keppra in the generic form of levetiracetam 1500 mg 3 times a day. He had been tried on topiramate but it caused him to have a stomach upset. Since the patient has severe neuropathy and chronic venostasis he has also been unsteady on his feet and a high fall risk was assessed.  He has psoriasis and easily bruises he fell in his home and broke his left ankle in a fall in February 2012 . he now fell , stumbling over his walker and broke his right ankle April 2015,  he spent a month in rehabilitation, developed a megacolon, he has a urinary catheter - Foley infected -he had to be readmitted with urosepsis, he has a colostomy bag which also has let to skin lacerations while in a facility  not adequately cared for.  His sister seems to be the one to change and since she  Provides  skin care he has no repeated infections, the folwy has been replaced.  The last lab level was obtained through Dr. Erling Cruz on 11-19-2010 with a normal CBC complex metabolic panel and a phenytoin level of 6. He also ordered a DEXA scan last 2008 and that was normal. A normal bone density test from  March 2013 was normal -according to Dr. Mathis Fare note . He has progressive numbness in his legs and feet but weaning him off Dilantin was not successful and increased his seizure frequency. It is likely that his neuropathy is partially caused by Dilantin.  Interval history on 2-20 4-16. [name] was admitted in status epilepticus on 05/17/2014 to the [facility] and followed by Dr. Cruzita Lederer,  He underwent multiple multiple brain scans which did not show an intracranial abnormality it was also noted that he was unable to swallow any medications during his status and therefore received Dilantin and Keppra intravenously. He was intubated for a couple of days. The status epilepticus was broken by being given IV medications. He is back on Dilantin and I have increased that in his last visit to 400 mg at night instead of alternating 300 with 400 we will resume this today. I refill his Keppra today as well as his Neurontin which I further increased by 600 mg. Neurontin also an antiepileptic medication is used in his case to treat his significant sensory abnormalities related to a neuropathy. What is remarkable to me is that the patient has almost no control over his lower extremities and lost all muscle tone by his upper extremities provide good grip strength and normal movement and tone. A nerve conduction study and EMG with Dr. Jannifer Franklin  just documented neuropathy. The admission to the hospital was precipitated by developing pneumonia. The CT scan of the head was reviewed and the report is copied to today's note. I have discussed with the patient today that I would like for him to undergo a thoracic spine MRI if possible his last brain MRI by my records is from 2004, ordered by Dr. Jannifer Franklin at the time. In addition I do not need a brain MRI at this point I think that it is clear that the pneumonia cost him to have the seizures which is not an unknown or uncommon trigger. Any infection below the seizure  threshold. What I would like to research further why his lower extremities are disproportionately affected by a neuropathy.  Interval history from 5-20 4-16. She no longer has physical therapy provided through [facility] payment, he and his sister are using some weight exercises at home. He has also healed from this macerated skin near the colostomy,. A honey-based product has been helpful in establishing better nor skin. Dr. Cornelious Bryant thousand patient and exchanged his nocturnal time dose of gabapentin with doxepin. This helped him to sleep. Does not help the pain. He also has a rectal pain pulled endorsed neuropathy. I would like for Dr. Cornelious Bryant to have another visit with the patient also wondered if she  couldperform any kind of study on the pudendal nerve?.CD  Review of Systems: Out of a complete 14 system review, the patient complains of only the following symptoms, and all other reviewed systems are negative.  severe numbness to all modalities, poor circulation, no clots, no DVT now, on anticoagulation.   Unable to ambulate [facility] [place].   History   Social History  . Marital Status: Single    Spouse Name: N/A  . Number of Children: 0  . Years of Education: HS   Occupational History  . Not on file.   Social History Main Topics  . Smoking status: Former Smoker    Types: Pipe    Quit date: 08/10/2013  . Smokeless tobacco: Never Used  . Alcohol Use: No  . Drug Use: No  . Sexual Activity: Not on file   Other Topics Concern  . Not on file   Social History Narrative   Patient is single and lives with his sister.   Patient is retired from [employer].   Patient has a high school education.   Patient drinks one cup of caffeine daily.   Patient is right-handed.          Family History  Problem Relation Age of Onset  . Vision loss Father   . Hypertension Father   . Diabetes Father   . Heart disease Father   . Cancer Father     colon  . Diabetes Mother   . Hypertension  Mother   . Heart disease Mother   . Dementia Mother   . Cancer Mother     breast ca  . Diabetes Sister   . Hyperlipidemia Sister   . Hypertension Sister   . Diabetes Brother   . Kidney disease Brother   . Heart disease Brother     Past Medical History  Diagnosis Date  . Depression   . Seizures   . Deep vein thrombophlebitis of left leg   . Deep vein thrombosis of right lower extremity   . Psoriasis   . Arthritis   . DVT (deep venous thrombosis)     multiple times  . Weakness of both legs   .  Peripheral neuropathy   . Venous stasis   . Hyperplasia of prostate   . Hyperlipidemia   . Atrial fibrillation     a. Dx 05/2014 in setting of seizure, PNA. S/p TEE/DCCV.  Marland Kitchen PFO (patent foramen ovale)     a. 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  . Bowel perforation     a. transvere and R colectomy and ileostomy in 12/25/13.  . Obesity     Past Surgical History  Procedure Laterality Date  . Colonoscopy  03/26/2012    Procedure: COLONOSCOPY;  Surgeon: Beryle Beams, MD;  Location: [facility];  Service: Endoscopy;  Laterality: N/A;  . Tonsillectomy      as child  . Flexible sigmoidoscopy N/A 12/09/2013    Procedure: FLEXIBLE SIGMOIDOSCOPY;  Surgeon: Beryle Beams, MD;  Location: [facility];  Service: Endoscopy;  Laterality: N/A;  . Laparotomy N/A 12/25/2013    Procedure: EXPLORATORY LAPAROTOMY/Transverse and right coloectomy/ end  ileostomy and mucus  fistula;  Surgeon: Ralene Ok, MD;  Location: [place];  Service: General;  Laterality: N/A;  . Ankle fracture surgery  09/04/13  . Colostomy  12-25-13    Ruptured colon  . Tee without cardioversion N/A 05/24/2014    Procedure: TRANSESOPHAGEAL ECHOCARDIOGRAM (TEE);  Surgeon: Josue Hector, MD;  Location[facility] ENDOSCOPY;  Service: Cardiovascular;  Laterality: N/A;  . Cardioversion N/A 05/24/2014    Procedure: CARDIOVERSION;  Surgeon: Josue Hector, MD;  Location: [facility] ENDOSCOPY;  Service:  Cardiovascular;  Laterality: N/A;    Current Outpatient Prescriptions  Medication Sig Dispense Refill  . acetaminophen (TYLENOL) 500 MG tablet Take 1 tablet (500 mg total) by mouth 2 (two) times daily as needed for mild pain or fever. 30 tablet 0  . Calcium Carb-Cholecalciferol (CALCIUM-VITAMIN D) 600-400 MG-UNIT TABS Take 1 tablet by mouth daily. For calcium supplement    . diltiazem (CARDIZEM CD) 180 MG 24 hr capsule Take 1 capsule (180 mg total) by mouth daily. 30 capsule 11  . doxepin (SINEQUAN) 25 MG capsule   5  . DULoxetine (CYMBALTA) 60 MG capsule Take 60 mg by mouth daily.    Marland Kitchen gabapentin (NEURONTIN) 600 MG tablet 600 mg. 4 times a day   3  . Gabapentin, Once-Daily, 600 MG TABS Gralise 600 mg in AM and 600 mg at lunch  1200 mg in PM. Po 120 tablet 5  . levETIRAcetam (KEPPRA) 750 MG tablet Take 2 tablets (1,500 mg total) by mouth 3 (three) times daily. For seizures 540 tablet 1  . LORazepam (ATIVAN) 1 MG tablet Use 1 mg tab to break focal seizure activity. As needed, do not exceed 2 mg a day. 30 tablet 0  . Multiple Vitamin (MULTIVITAMIN WITH MINERALS) TABS Take 1 tablet by mouth daily.    . phenytoin (DILANTIN) 100 MG ER capsule Take 4 capsules (400 mg total) by mouth at bedtime. 360 capsule 3  . pravastatin (PRAVACHOL) 20 MG tablet Take 20 mg by mouth daily. For HLD    . tamsulosin (FLOMAX) 0.4 MG CAPS capsule Take 0.4 mg by mouth daily. For BPH    . UNABLE TO FIND Med Name: [name] Powder.  For use around colostomy    . vitamin B-12 (CYANOCOBALAMIN) 1000 MCG tablet Take 1,000 mcg by mouth daily. For supplement    . warfarin (COUMADIN) 5 MG tablet Take 5 mg by mouth every evening. Take with 2mg  tablet for a total dose of 7mg  DAILY  No current facility-administered medications for this visit.    Allergies as of 10/03/2014 - Review Complete 10/03/2014  Allergen Reaction Noted  . Penicillins  03/26/2012    Vitals: BP 124/70 mmHg  Pulse 86  Resp 20  Ht   Wt  Last  Weight:  Wt Readings from Last 1 Encounters:  07/05/14 280 lb (127.007 kg)   Last Height:   Ht Readings from Last 1 Encounters:  07/05/14 6\' 5"  (1.956 m)    Physical exam:  General: The patient is awake, alert and appears not in acute distress. The patient is well groomed. Head: Normocephalic, atraumatic. Neck is supple. He is examined in a supine position.  Mallampati 3, neck circumference: 16 Cardiovascular:  Regular rate and rhythm , without  murmurs or carotid bruit, and without distended neck veins. Respiratory: Lungs are  Crackling, not wheezing. Left side clear to auscultation. Abdominal pouch , colostoma.  The patient has very little abdominal muscle tone, he does not have flank pain and the his abdomen does not seem to be distended now. He complains of a constant rectal pain the feeling as if a foreign body is in his anus something that would pull across the pudendal nerve, a feeling of " pulling on the testicles". Skin:  Without evidence of edema, or rash Trunk: BMI is elevated and patient has normal posture.  Neurologic exam : The patient is awake and alert, oriented to place and time.  Memory subjective described as intact.  There is a normal attention span & concentration ability. Speech is fluent without dysarthria, dysphonia or aphasia. He is very conversant.  Mood and affect are appropriate.  Cranial nerves: Pupils are equal and briskly reactive to light. Extraocular movements in vertical and horizontal planes intact and without nystagmus.  Visual fields by finger perimetry are intact. Hearing to finger rub intact.  Facial sensation intact to fine touch.  Facial motor strength is symmetric and tongue and uvula move midline. Tongue protrusion into either cheek is normal. Shoulder shrug is normal.   Motor exam:   Weakness of dorsiflexion and toe movements, but the grip is preserved.  No cogwheeling.  He is almost completely paralyzed form the groin down. Can wiggle his  toes, but cannot provide resistance.  Sensory:  Fine touch, pinprick and vibration were tested and are absent in knee and below.  Coordination: Rapid alternating movements in the fingers/hands were normal.  Finger-to-nose maneuver with evidence of dysmetria  Gait and station: Patient on stretcher.  Deep tendon reflexes: in the upper and lower extremities are absent, as is Babinski .    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 Known origin for his epileptic condition. He has been followed by Dr. Erling Cruz before for decades. Profound neuropathy, hearing loss, loss of muscle tone in both lower extremities. Lost all hair on the lower extremities and has dystrophic ,shiny skin.  There is unable total report that the patient was written by a tick before his neuropathy and seizures developed. This was in the year he developed very high fevers and flaccid paralysis after that. Theoretically this could have been a [facility] spotted fever.  Plan:  Treatment plan and additional workup :  Absence Epilepsy treatment on 3 medications.  Neuropathy treatment on one of the epilepsy medications.  EMG and NCV , is this axonal and  demyelinating neuropathy?  Left unanswered , just advanced stage of sensory and motor , painful neuropathy,  Needs to start to PT, OT , and continue. Medicare completed its course, he didn't reach his goal.   Change to Gralise or Horizon for longer acting pain control failed.  Dr Ace Gins replaced the 1200 mg at night of gabapentin with Doxepin at 75 mg at night.  It has not taken place of pain medication but it has helped him to sleep. Dr. Ashby Dawes, his primary care physician, Mariann Laster be to draw a CPAP today complains of metabolic panel I will also add a CBC with differential. In  addition I would like to mention that honey  (so-called [place] meadow  honey) has helped the patient to recover from his macerated skin around the colostoma.        Asencion Partridge Goble Fudala MD 10/03/2014

## 2014-10-05 LAB — PROTEIN ELECTROPHORESIS, SERUM
A/G RATIO SPE: 1 (ref 0.7–2.0)
Albumin ELP: 3.1 g/dL — ABNORMAL LOW (ref 3.2–5.6)
Alpha 1: 0.2 g/dL (ref 0.1–0.4)
Alpha 2: 0.9 g/dL (ref 0.4–1.2)
BETA: 1.2 g/dL (ref 0.6–1.3)
Gamma Globulin: 0.9 g/dL (ref 0.5–1.6)
Globulin, Total: 3.2 g/dL (ref 2.0–4.5)

## 2014-10-05 LAB — COMPREHENSIVE METABOLIC PANEL
A/G RATIO: 1.4 (ref 1.1–2.5)
ALK PHOS: 158 IU/L — AB (ref 39–117)
ALT: 36 IU/L (ref 0–44)
AST: 20 IU/L (ref 0–40)
Albumin: 3.7 g/dL (ref 3.6–4.8)
BUN / CREAT RATIO: 27 — AB (ref 10–22)
BUN: 16 mg/dL (ref 8–27)
CO2: 26 mmol/L (ref 18–29)
Calcium: 8.6 mg/dL (ref 8.6–10.2)
Chloride: 103 mmol/L (ref 97–108)
Creatinine, Ser: 0.6 mg/dL — ABNORMAL LOW (ref 0.76–1.27)
GFR calc Af Amer: 125 mL/min/{1.73_m2} (ref >59)
GFR calc non Af Amer: 108 mL/min/{1.73_m2} (ref >59)
GLUCOSE: 117 mg/dL — AB (ref 65–99)
Globulin, Total: 2.6 g/dL (ref 1.5–4.5)
Potassium: 4.4 mmol/L (ref 3.5–5.2)
Sodium: 142 mmol/L (ref 134–144)
Total Protein: 6.3 g/dL (ref 6.0–8.5)

## 2014-10-05 LAB — CBC WITH DIFFERENTIAL/PLATELET
BASOS ABS: 0 10*3/uL (ref 0.0–0.2)
BASOS: 0 %
EOS (ABSOLUTE): 0.3 10*3/uL (ref 0.0–0.4)
Eos: 6 %
HEMOGLOBIN: 15.8 g/dL (ref 12.6–17.7)
Hematocrit: 45.6 % (ref 37.5–51.0)
IMMATURE GRANS (ABS): 0 10*3/uL (ref 0.0–0.1)
IMMATURE GRANULOCYTES: 0 %
LYMPHS ABS: 1.2 10*3/uL (ref 0.7–3.1)
LYMPHS: 22 %
MCH: 31.5 pg (ref 26.6–33.0)
MCHC: 34.6 g/dL (ref 31.5–35.7)
MCV: 91 fL (ref 79–97)
MONOCYTES: 9 %
Monocytes Absolute: 0.5 10*3/uL (ref 0.1–0.9)
NEUTROS ABS: 3.4 10*3/uL (ref 1.4–7.0)
Neutrophils: 63 %
Platelets: 166 10*3/uL (ref 150–379)
RBC: 5.01 x10E6/uL (ref 4.14–5.80)
RDW: 14.1 % (ref 12.3–15.4)
WBC: 5.4 10*3/uL (ref 3.4–10.8)

## 2014-10-05 LAB — B. BURGDORFI ANTIBODIES

## 2014-10-05 LAB — WEST NILE VIRUS ANTIBODY,SERUM
WEST NILE VIRUS, IGM: NEGATIVE
West Nile Virus, IgG: NEGATIVE

## 2014-10-10 ENCOUNTER — Telehealth: Payer: Self-pay

## 2014-10-10 NOTE — Telephone Encounter (Signed)
Called pt to inform him of his lab results. Pt verbalized understanding and asked me to fax the results to Dr. Mathis Fare office at [facility].

## 2014-10-24 ENCOUNTER — Encounter: Payer: Self-pay | Admitting: Internal Medicine

## 2014-10-24 ENCOUNTER — Ambulatory Visit (INDEPENDENT_AMBULATORY_CARE_PROVIDER_SITE_OTHER): Payer: Medicare Other | Admitting: Internal Medicine

## 2014-10-24 VITALS — BP 124/76 | HR 75 | Ht 77.0 in | Wt 280.0 lb

## 2014-10-24 DIAGNOSIS — Z86718 Personal history of other venous thrombosis and embolism: Secondary | ICD-10-CM

## 2014-10-24 DIAGNOSIS — I4891 Unspecified atrial fibrillation: Secondary | ICD-10-CM | POA: Diagnosis not present

## 2014-10-24 DIAGNOSIS — R69 Illness, unspecified: Secondary | ICD-10-CM | POA: Diagnosis not present

## 2014-10-24 DIAGNOSIS — R198 Other specified symptoms and signs involving the digestive system and abdomen: Secondary | ICD-10-CM

## 2014-10-24 DIAGNOSIS — I480 Paroxysmal atrial fibrillation: Secondary | ICD-10-CM | POA: Diagnosis not present

## 2014-10-24 MED ORDER — DABIGATRAN ETEXILATE MESYLATE 150 MG PO CAPS: 150.0000 mg | ORAL_CAPSULE | Freq: Two times a day (BID) | ORAL | Status: DC

## 2014-10-24 NOTE — Patient Instructions (Addendum)
Your physician has recommended you make the following change in your medication - STOP warfarin - START pradaxa 150mg  twice daily  >> samples #60 given to patient  Your physician wants you to follow-up in: 6 months with Dr. Debara Pickett. You will receive a reminder letter in the mail two months in advance. If you don't receive a letter, please call our office to schedule the follow-up appointment.

## 2014-10-24 NOTE — Progress Notes (Signed)
OFFICE NOTE  Chief Complaint:  Hospital follow-up  Primary Care Physician: Merrilee Seashore, MD  HPI:  [name] is a 64 y.o. 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 He was apparently excessively sedated and he came obtunded requiring intubation for airway protection. He was also found to have a pneumonia. Earlier this morning he went into atrial fibrillation with rapid ventricular response. He is unaware of this. He has no history of A. fib that he is aware of. His mother did have a history of A. fib however she was very late in age with onset. He's denied any chest pain or worsening shortness of breath. Cardiology is asked to evaluate and recommend management for atrial fibrillation. Of note he is on chronic warfarin therapy for his DVT  [name] returns today for follow-up. He was seen in the office on a stretcher. He has difficulty getting to anticoagulation appointments at his primary care provider and they recommended follow-up in our office. I do not see if he has any contraindications to being on a direct oral anticoagulant. For some time he was on Xarelto, however he had a perforated viscus and ultimately had bleeding associated with that. He's very concerned about being on a medicine that cannot be reversed and is asking if there are any direct oral anticoagulant which could be reversed, as an alternative to warfarin.  PMHx:  Past Medical History  Diagnosis Date  . Depression   . Seizures   . Deep vein thrombophlebitis of left leg   . Deep vein thrombosis of right lower extremity   . Psoriasis   . Arthritis   . DVT (deep venous thrombosis)     multiple times  . Weakness of both legs   . Peripheral neuropathy   .  Venous stasis   . Hyperplasia of prostate   . Hyperlipidemia   . Atrial fibrillation     a. Dx 05/2014 in setting of seizure, PNA. S/p TEE/DCCV.  Marland Kitchen PFO (patent foramen ovale)     a. 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  . Bowel perforation     a. transvere and R colectomy and ileostomy in 12/25/13.  . Obesity     Past Surgical History  Procedure Laterality Date  . Colonoscopy  03/26/2012    Procedure: COLONOSCOPY;  Surgeon: Beryle Beams, MD;  Location: [facility];  Service: Endoscopy;  Laterality: N/A;  . Tonsillectomy      as child  . Flexible sigmoidoscopy N/A 12/09/2013    Procedure: FLEXIBLE SIGMOIDOSCOPY;  Surgeon: Beryle Beams, MD;  Location: [facility];  Service: Endoscopy;  Laterality: N/A;  . Laparotomy N/A 12/25/2013    Procedure: EXPLORATORY LAPAROTOMY/Transverse and right coloectomy/ end  ileostomy and mucus  fistula;  Surgeon: Ralene Ok, MD;  Location: [place];  Service: General;  Laterality: N/A;  . Ankle fracture surgery  09/04/13  . Colostomy  12-25-13    Ruptured colon  . Tee without cardioversion N/A 05/24/2014    Procedure: TRANSESOPHAGEAL ECHOCARDIOGRAM (TEE);  Surgeon: Josue Hector, MD;  Location: [facility] ENDOSCOPY;  Service: Cardiovascular;  Laterality: N/A;  . Cardioversion N/A 05/24/2014    Procedure: CARDIOVERSION;  Surgeon: Josue Hector, MD;  Location: [place];  Service:  Cardiovascular;  Laterality: N/A;    FAMHx:  Family History  Problem Relation Age of Onset  . Vision loss Father   . Hypertension Father   . Diabetes Father   . Heart disease Father   . Cancer Father     colon  . Diabetes Mother   . Hypertension Mother   . Heart disease Mother   . Dementia Mother   . Cancer Mother     breast ca  . Diabetes Sister   . Hyperlipidemia Sister   . Hypertension Sister   . Diabetes Brother   . Kidney disease Brother   . Heart disease Brother     SOCHx:   reports that he quit smoking about 14  months ago. His smoking use included Pipe. He has never used smokeless tobacco. He reports that he does not drink alcohol or use illicit drugs.  ALLERGIES:  Allergies  Allergen Reactions  . Penicillins     Mother and brother have had severe allergic reactions, no personal reaction    ROS: A comprehensive review of systems was negative except for: Respiratory: positive for dyspnea on exertion  HOME MEDS: Current Outpatient Prescriptions  Medication Sig Dispense Refill  . acetaminophen (TYLENOL) 500 MG tablet Take 1 tablet (500 mg total) by mouth 2 (two) times daily as needed for mild pain or fever. 30 tablet 0  . Calcium Carb-Cholecalciferol (CALCIUM-VITAMIN D) 600-400 MG-UNIT TABS Take 1 tablet by mouth daily. For calcium supplement    . diltiazem (CARDIZEM CD) 180 MG 24 hr capsule Take 1 capsule (180 mg total) by mouth daily. 30 capsule 11  . doxepin (SINEQUAN) 75 MG capsule Take 2 capsules by mouth daily.  5  . DULoxetine (CYMBALTA) 60 MG capsule Take 60 mg by mouth daily.    Marland Kitchen GRALISE 600 MG TABS Take 1 tablet by mouth 2 (two) times daily.   5  . levETIRAcetam (KEPPRA) 750 MG tablet Take 2 tablets (1,500 mg total) by mouth 3 (three) times daily. For seizures 540 tablet 1  . LORazepam (ATIVAN) 1 MG tablet Use 1 mg tab to break focal seizure activity. As needed, do not exceed 2 mg a day. 30 tablet 0  . Multiple Vitamin (MULTIVITAMIN WITH MINERALS) TABS Take 1 tablet by mouth daily.    . phenytoin (DILANTIN) 100 MG ER capsule Take 4 capsules (400 mg total) by mouth at bedtime. 360 capsule 3  . pravastatin (PRAVACHOL) 20 MG tablet Take 20 mg by mouth daily. For HLD    . tamsulosin (FLOMAX) 0.4 MG CAPS capsule Take 0.4 mg by mouth daily. For BPH    . UNABLE TO FIND Med Name: [name] Powder.  For use around colostomy    . vitamin B-12 (CYANOCOBALAMIN) 1000 MCG tablet Take 1,000 mcg by mouth daily. For supplement    . dabigatran (PRADAXA) 150 MG CAPS capsule Take 1 capsule (150 mg  total) by mouth 2 (two) times daily. 60 capsule 6   No current facility-administered medications for this visit.    LABS/IMAGING: No results found for this or any previous visit (from the past 48 hour(s)). No results found.  WEIGHTS: Wt Readings from Last 3 Encounters:  10/24/14 280 lb (127.007 kg)  07/05/14 280 lb (127.007 kg)  05/24/14 282 lb 12.8 oz (128.277 kg)    VITALS: BP 124/76 mmHg  Pulse 75  Ht 6\' 5"  (1.956 m)  Wt 280 lb (127.007 kg)  BMI 33.20 kg/m2  EXAM: General appearance: alert, no distress and Lying  supine on a stretcher Neck: no carotid bruit, no JVD and thyroid not enlarged, symmetric, no tenderness/mass/nodules Lungs: clear to auscultation bilaterally Heart: regular rate and rhythm, S1, S2 normal, no murmur, click, rub or gallop Abdomen: soft, non-tender; bowel sounds normal; no masses,  no organomegaly Extremities: extremities normal, atraumatic, no cyanosis or edema Pulses: 2+ and symmetric Skin: Skin color, texture, turgor normal. No rashes or lesions Neurologic: Grossly normal Psych: Pleasant  EKG: Normal sinus rhythm at 75  ASSESSMENT: 1. PACs as well atrial fibrillation-maintaining sinus 2. History of DVT on warfarin 3. Nonambulatory 4. Dyslipidemia  PLAN: 1.   [name] has a long-term need for anticoagulation. He's currently on warfarin but has difficulty getting to appointments and is nonambulatory. I think he do better on a direct oral anticoagulant. He is requesting a medicine that has a reversal potential due to recent bleeding secondary to perforated viscus. I think a good option for him would be Pradaxa 150 mg twice daily. There is in reversal agent called Pradaxabind which is available and may given the security that he is interested. I will have him speak with our pharmacist today and start him on Pradaxa in lieu of warfarin. Plan to see him back in 6 months.  Pixie Casino, MD, [facility] Attending Cardiologist [place] [facility] 10/24/2014, 5:47 PM

## 2014-10-31 ENCOUNTER — Other Ambulatory Visit: Payer: Self-pay

## 2014-10-31 ENCOUNTER — Telehealth: Payer: Self-pay | Admitting: Neurology

## 2014-10-31 DIAGNOSIS — G609 Hereditary and idiopathic neuropathy, unspecified: Secondary | ICD-10-CM

## 2014-10-31 NOTE — Telephone Encounter (Signed)
Patient is calling as his pain management doctor Dr. Ace Gins stating that they are going to self-pay and more homeopathic medicine.  He would like for you to recommend another doctor for him. Thank you!

## 2014-10-31 NOTE — Telephone Encounter (Signed)
Called pt and informed him that Dr. Brett Fairy recommended Dr. Doren Custard, a pain management specialist, and the referral was placed. Pt verbalized understanding.

## 2014-10-31 NOTE — Progress Notes (Signed)
Spoke to Dr. Brett Fairy re: a pain clinic referral. She recommended Dr. Hardin Negus. Order placed.

## 2014-12-05 NOTE — Telephone Encounter (Signed)
ERROR

## 2014-12-24 IMAGING — CT CT ABD-PELV W/ CM
1 of 3 series · 13 of 32 positions shown, 18 images · IV contrast (omnipaque)
Comparison: CT scan of January 21, 2014.

CLINICAL DATA: Fever.

EXAM:
CT ABDOMEN AND PELVIS WITH CONTRAST
TECHNIQUE: Multidetector CT imaging of the abdomen and pelvis was performed
using the standard protocol following bolus administration of
intravenous contrast.
CONTRAST:  100mL OMNIPAQUE IOHEXOL 300 MG/ML  SOLN

[Series 2: rtn ap with st · axial · 0.98mm/px · z∈[+842,+1342]mm · 13 of 112 slices shown, 18 images]
[im 6/112  soft-tissue]
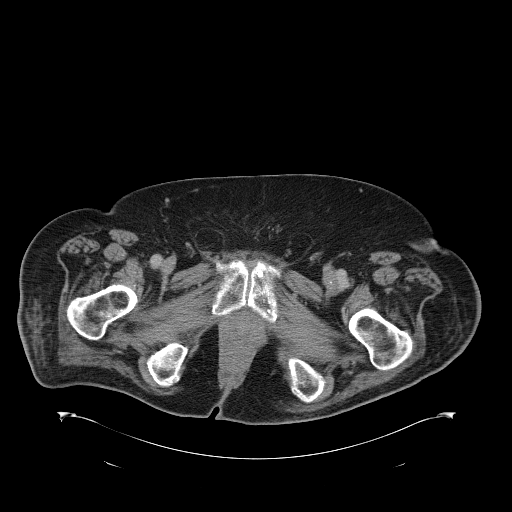
[im 6/112  bone]
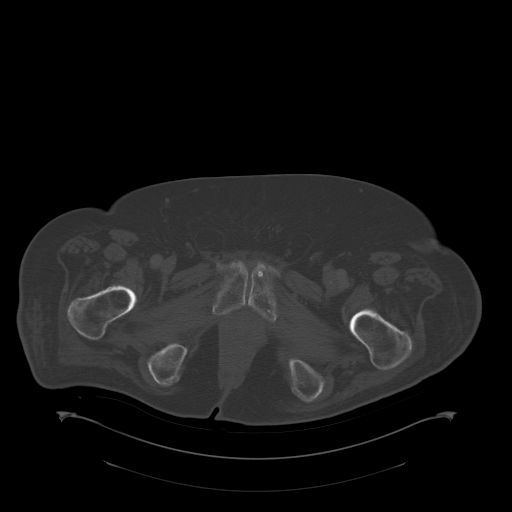
[im 18/112  soft-tissue]
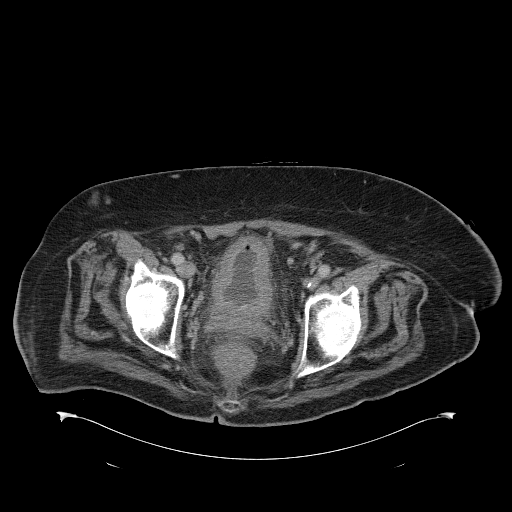
[im 24/112  soft-tissue]
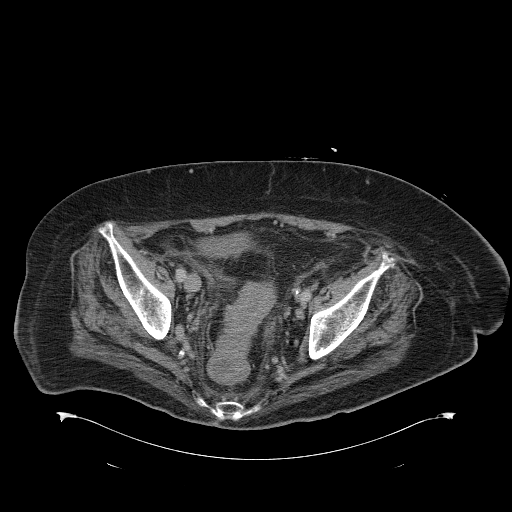
[im 36/112  soft-tissue]
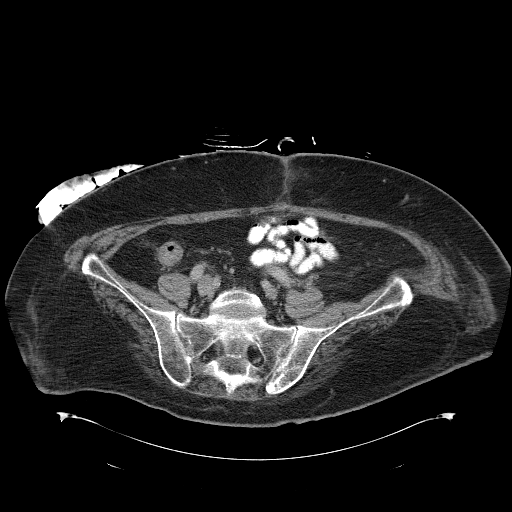
[im 41/112  soft-tissue]
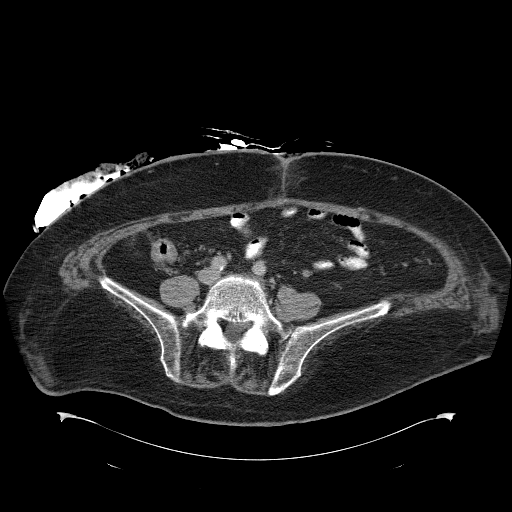
[im 53/112  soft-tissue]
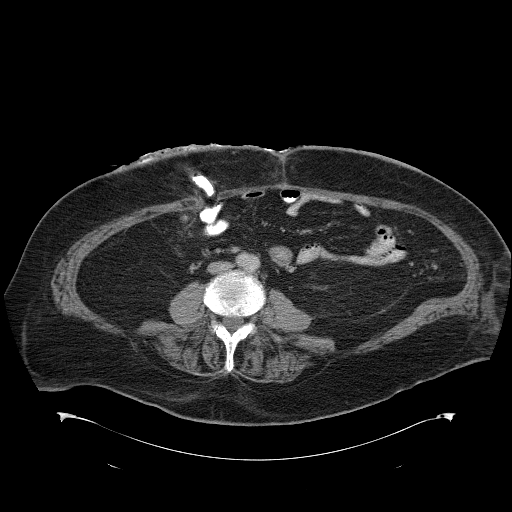
[im 59/112  soft-tissue]
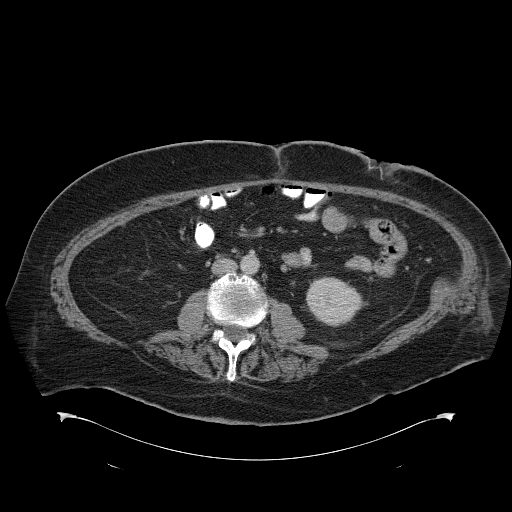
[im 71/112  soft-tissue]
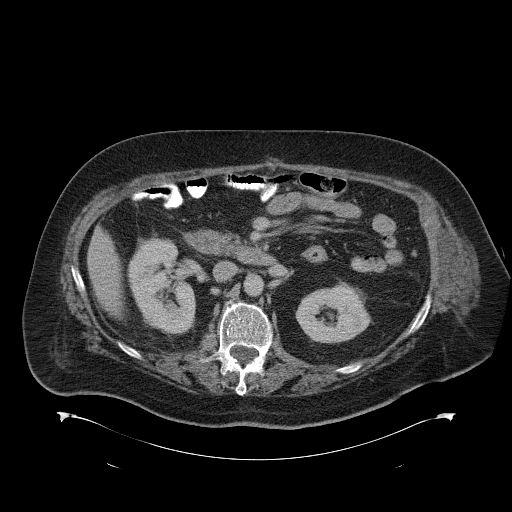
[im 76/112  soft-tissue]
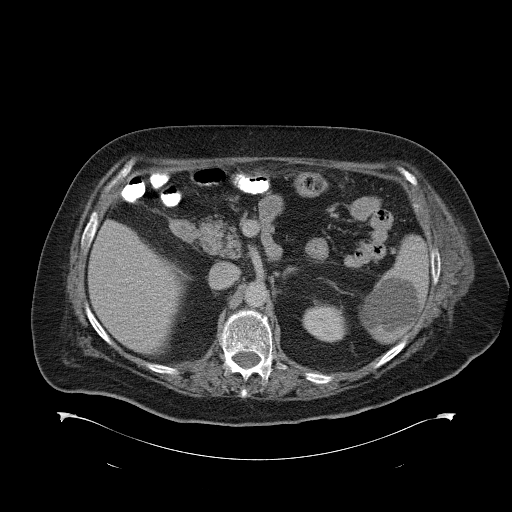
[im 76/112  bone]
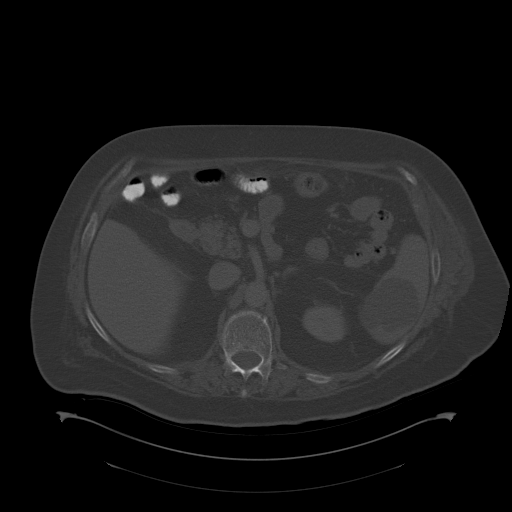
[im 88/112  soft-tissue]
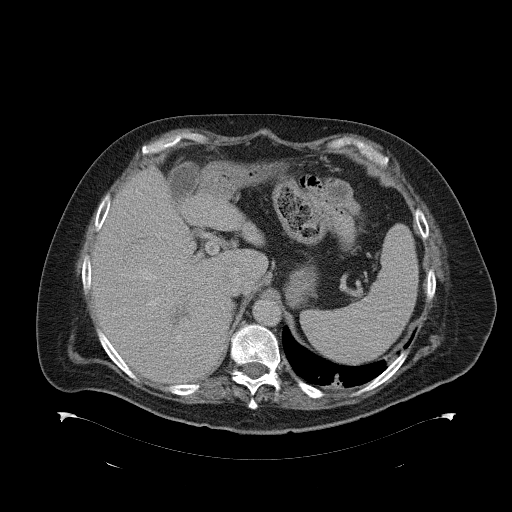
[im 88/112  lung]
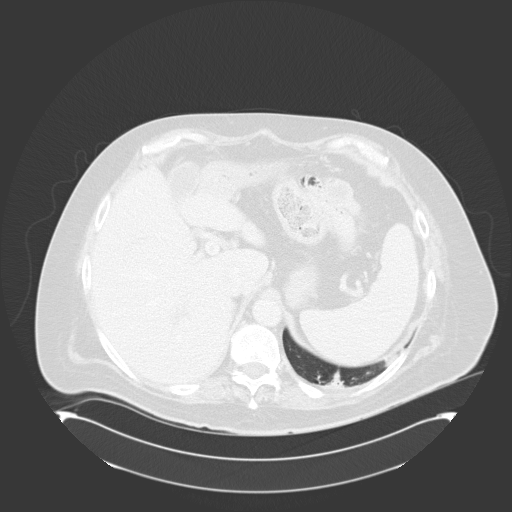
[im 94/112  soft-tissue]
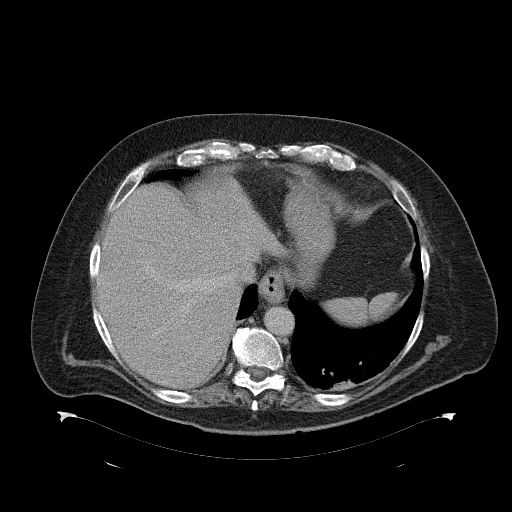
[im 94/112  lung]
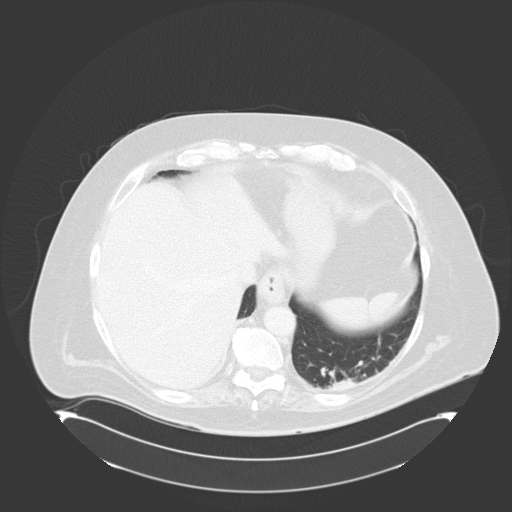
[im 100/112  lung]
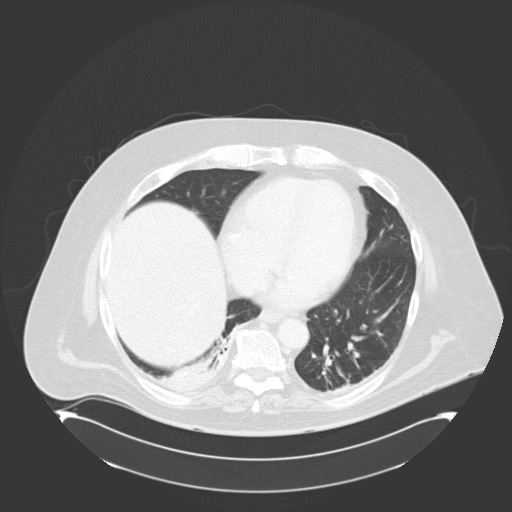
[im 106/112  soft-tissue]
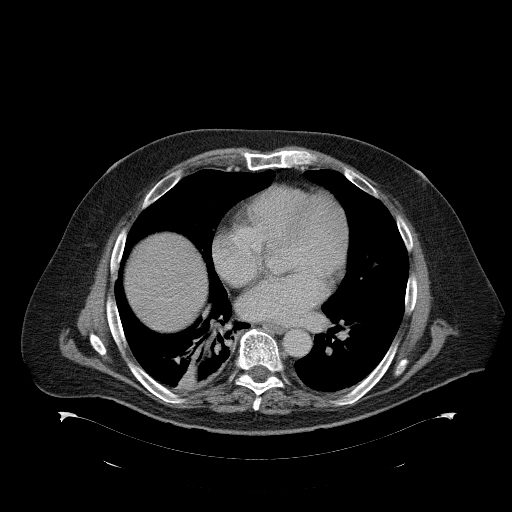
[im 106/112  lung]
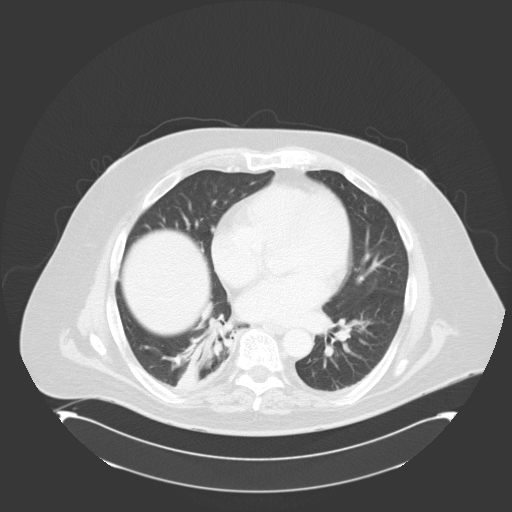

[13 of 32 positions shown; findings below may reference images not displayed]

FINDINGS: Consolidation is noted posteriorly in the right lower lobe
consistent with pneumonia or subsegmental atelectasis, with small
associated pleural effusion. No definite osseous abnormality is
noted.

No gallstones are noted. The low density seen in the right hepatic
lobe on prior exam appears to have resolved. Stable cyst is noted in
the spleen. Stable mild splenomegaly is noted. The pancreas appears
normal. Adrenal glands appear normal. No hydronephrosis or renal
obstruction is noted. No renal or ureteral calculi are noted. The
patient is status post right hemicolectomy. Ileostomy is again noted
in the right lower quadrant. There remains mucous fistula which
extends from the rectum and it exits from the left upper quadrant as
noted on prior exam. There is again noted significant wall
thickening of this fistula which is worse compared to prior exam and
is concerning for proctocolitis. There also appears to be
substantial wall thickening of the urinary bladder which may be due
to lack of distension, but cystitis cannot be excluded. Foley
catheter noted on prior exam has been removed. No abnormal fluid
collection or definite abscess is noted. Mild bilateral fat
containing inguinal hernias are noted. No significant adenopathy is
noted. There is continued presence of left common and external iliac
veins which are diminutive in caliber with associated calcifications
suggesting sequela of chronic deep venous thrombosis.
IMPRESSION: Mild consolidation is noted posteriorly in the right lung base
concerning for pneumonia or atelectasis.

Status post right hemicolectomy ileostomy seen in the right lower
quadrant. There remains mucous fistula extending from the rectum and
exiting the left upper quadrant anteriorly as noted on prior exam.
There is wall thickening of the mucous fistula which appears to be
worse in the sigmoid colon and rectal region compared to prior exam,
suggesting worsening proctocolitis.

No abnormal fluid collection or definite abscess is noted.

Foley catheter has been removed. Significant wall thickening of the
urinary bladder is noted with possible surrounding inflammation
which may be due to lack of distension, but cystitis cannot be
excluded. Clinical correlation is recommended.

## 2014-12-25 ENCOUNTER — Telehealth: Payer: Self-pay | Admitting: Neurology

## 2014-12-25 DIAGNOSIS — G609 Hereditary and idiopathic neuropathy, unspecified: Secondary | ICD-10-CM

## 2014-12-25 DIAGNOSIS — G894 Chronic pain syndrome: Secondary | ICD-10-CM

## 2014-12-25 NOTE — Telephone Encounter (Signed)
Spoke to pt's sister. She says that Dr. Hardin Negus at pain management is not giving them an appt because he is busy. She is requesting another referral to another pain management doctor.

## 2014-12-25 NOTE — Telephone Encounter (Signed)
Patient's sister called requesting to have a new referral sent to another pain clinic. It has been 1 month a referral is on Dr's desk but can not get an appointment. Please call and advise. She can be reached at [phone].

## 2015-03-20 ENCOUNTER — Encounter: Payer: Self-pay | Admitting: Internal Medicine

## 2015-03-29 ENCOUNTER — Telehealth: Payer: Self-pay

## 2015-03-29 NOTE — Telephone Encounter (Signed)
Spoke to patient. R/s appt on 11/23 for an earlier time. Arrival time 10:15 for a 10:30 appt. Patient verbalized understanding.

## 2015-04-04 ENCOUNTER — Ambulatory Visit (INDEPENDENT_AMBULATORY_CARE_PROVIDER_SITE_OTHER): Payer: Medicare Other | Admitting: Adult Health

## 2015-04-04 ENCOUNTER — Ambulatory Visit: Payer: Medicare Other | Admitting: Adult Health

## 2015-04-04 ENCOUNTER — Encounter: Payer: Self-pay | Admitting: Adult Health

## 2015-04-04 VITALS — BP 118/75 | HR 78 | Ht 77.0 in

## 2015-04-04 DIAGNOSIS — R569 Unspecified convulsions: Secondary | ICD-10-CM | POA: Diagnosis not present

## 2015-04-04 DIAGNOSIS — Z79899 Other long term (current) drug therapy: Secondary | ICD-10-CM

## 2015-04-04 DIAGNOSIS — Z5181 Encounter for therapeutic drug level monitoring: Secondary | ICD-10-CM | POA: Diagnosis not present

## 2015-04-04 DIAGNOSIS — G5793 Unspecified mononeuropathy of bilateral lower limbs: Secondary | ICD-10-CM

## 2015-04-04 NOTE — Progress Notes (Signed)
I agree with the assessment and plan as directed by NP .The patient is known to me .   Rasean Joos, MD  

## 2015-04-04 NOTE — Progress Notes (Signed)
PATIENT: [name] DOB: [birth year]  REASON FOR VISIT: follow up- seizures, neuropathyk HISTORY FROM: patient  HISTORY OF PRESENT ILLNESS: [name] is a 64 year old male with a history of epilepsy and neuropathy. He returns today for follow-up. The patient arrives today on a stretcher. He is currently on Keppra 1500 milligrams 3 times a day for seizures. He is also on Dilantin 400 mg at bedtime. His sister reports that he's did not have any seizures for several months until last week. She states that last week he had 3 absence seizures. Patient denies being sick, denies sleep deprivation and denies missing any medication during this timeframe. The sister does state that his PCP increased his Cymbalta during this time. She states that since then he has not had any additional seizures. Patient states that he continues to have discomfort in the lower extremities. He continues to use Gralise and Cymbalta. As mentioned previously his Cymbalta was increased by his primary care provider. The patient is completely bedridden. He states that his lower extremities are very weak limiting his mobility. The patient did have physical therapy but unfortunatelydid not progress as they desire. The patient denies urinary incontinence. He uses a urinal when needed. The patient has an ostomy bag. I discussed a motorized wheelchair and possible lift chair for the patient. However the patient states that he is more comfortable laying down. Patient and his sister deny any new neurological symptoms. He returns today for an evaluation.   HISTORY 10/03/14 [facility]): [name] is a 64 y.o. male, presenting on a stretcher, seen here as a referral from Dr. Ashby Dawes for new evaluation of Epilepsy, Neuropathy .   This patient has followed with Dr Erling Cruz prior to his retirement and was last seen 02-11-2011.  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 His head would turn to the left repeatedly , he did not lose awareness. The patient was placed on Dilantin brand name under milligrams 3 times a day alternating with 100 mg 4 times a day every other day. He was also on Keppra in the generic form of levetiracetam 1500 mg 3 times a day. He had been tried on topiramate but it caused him to have a stomach upset. Since the patient has severe neuropathy and chronic venostasis he has also been unsteady on his feet and a high fall risk was assessed.  He has psoriasis and easily bruises he fell in his home and broke his left ankle in a fall in February 2012 . he now fell , stumbling over his walker and broke his right ankle April 2015, he spent a month in rehabilitation, developed a megacolon, he has a urinary catheter - Foley infected -he had to be readmitted with urosepsis, he has a colostomy bag which also has let to skin lacerations while in a facility not adequately cared for.  His sister seems to be the one to change and since she Provides skin care he has no repeated infections, the folwy has been replaced. The last lab level was obtained through Dr. Erling Cruz on 11-19-2010 with a normal CBC complex metabolic panel and a phenytoin level of 6. He also ordered a DEXA scan last 2008 and that was normal. A normal bone density test from March 2013 was normal -according to Dr. Mathis Fare note . He has progressive numbness in his legs and feet but weaning him off Dilantin was not successful  and increased his seizure frequency. It is likely that his neuropathy is partially caused by Dilantin.  Interval history on 2-20 4-16. [name] was admitted in status epilepticus on 05/17/2014 to the [facility] and followed by Dr. Cruzita Lederer,  He underwent multiple multiple brain scans which did not show an intracranial abnormality it was also noted that he was unable to swallow any medications during his status and  therefore received Dilantin and Keppra intravenously. He was intubated for a couple of days. The status epilepticus was broken by being given IV medications. He is back on Dilantin and I have increased that in his last visit to 400 mg at night instead of alternating 300 with 400 we will resume this today. I refill his Keppra today as well as his Neurontin which I further increased by 600 mg. Neurontin also an antiepileptic medication is used in his case to treat his significant sensory abnormalities related to a neuropathy. What is remarkable to me is that the patient has almost no control over his lower extremities and lost all muscle tone by his upper extremities provide good grip strength and normal movement and tone. A nerve conduction study and EMG with Dr. Jannifer Franklin just documented neuropathy. The admission to the hospital was precipitated by developing pneumonia. The CT scan of the head was reviewed and the report is copied to today's note. I have discussed with the patient today that I would like for him to undergo a thoracic spine MRI if possible his last brain MRI by my records is from 2004, ordered by Dr. Jannifer Franklin at the time. In addition I do not need a brain MRI at this point I think that it is clear that the pneumonia cost him to have the seizures which is not an unknown or uncommon trigger. Any infection below the seizure threshold. What I would like to research further why his lower extremities are disproportionately affected by a neuropathy.  Interval history from 5-20 4-16. She no longer has physical therapy provided through [facility] payment, he and his sister are using some weight exercises at home. He has also healed from this macerated skin near the colostomy,. A honey-based product has been helpful in establishing better nor skin. Dr. Cornelious Bryant thousand patient and exchanged his nocturnal time dose of gabapentin with doxepin. This helped him to sleep. Does not help the pain. He also has a rectal  pain pulled endorsed neuropathy. I would like for Dr. Cornelious Bryant to have another visit with the patient also wondered if she couldperform any kind of study on the pudendal nerve?.CD  REVIEW OF SYSTEMS: Out of a complete 14 system review of symptoms, the patient complains only of the following symptoms, and all other reviewed systems are negative.  Leg swelling, rectal pain, painful urination, muscle cramps, seizures  ALLERGIES: Allergies  Allergen Reactions  . Penicillins     Mother and brother have had severe allergic reactions, no personal reaction    HOME MEDICATIONS: Outpatient Prescriptions Prior to Visit  Medication Sig Dispense Refill  . acetaminophen (TYLENOL) 500 MG tablet Take 1 tablet (500 mg total) by mouth 2 (two) times daily as needed for mild pain or fever. 30 tablet 0  . Calcium Carb-Cholecalciferol (CALCIUM-VITAMIN D) 600-400 MG-UNIT TABS Take 1 tablet by mouth daily. For calcium supplement    . dabigatran (PRADAXA) 150 MG CAPS capsule Take 1 capsule (150 mg total) by mouth 2 (two) times daily. 60 capsule 6  . diltiazem (CARDIZEM CD) 180 MG 24 hr capsule  Take 1 capsule (180 mg total) by mouth daily. 30 capsule 11  . doxepin (SINEQUAN) 75 MG capsule Take 2 capsules by mouth daily.  5  . DULoxetine (CYMBALTA) 60 MG capsule Take 60 mg by mouth daily.    Marland Kitchen GRALISE 600 MG TABS Take 1 tablet by mouth 2 (two) times daily.   5  . levETIRAcetam (KEPPRA) 750 MG tablet Take 2 tablets (1,500 mg total) by mouth 3 (three) times daily. For seizures 540 tablet 1  . LORazepam (ATIVAN) 1 MG tablet Use 1 mg tab to break focal seizure activity. As needed, do not exceed 2 mg a day. 30 tablet 0  . Multiple Vitamin (MULTIVITAMIN WITH MINERALS) TABS Take 1 tablet by mouth daily.    . phenytoin (DILANTIN) 100 MG ER capsule Take 4 capsules (400 mg total) by mouth at bedtime. 360 capsule 3  . pravastatin (PRAVACHOL) 20 MG tablet Take 20 mg by mouth daily. For HLD    . tamsulosin (FLOMAX) 0.4 MG CAPS  capsule Take 0.4 mg by mouth daily. For BPH    . UNABLE TO FIND Med Name: [name] Powder.  For use around colostomy    . vitamin B-12 (CYANOCOBALAMIN) 1000 MCG tablet Take 1,000 mcg by mouth daily. For supplement     No facility-administered medications prior to visit.    PAST MEDICAL HISTORY: Past Medical History  Diagnosis Date  . Depression   . Seizures   . Deep vein thrombophlebitis of left leg   . Deep vein thrombosis of right lower extremity   . Psoriasis   . Arthritis   . DVT (deep venous thrombosis)     multiple times  . Weakness of both legs   . Peripheral neuropathy   . Venous stasis   . Hyperplasia of prostate   . Hyperlipidemia   . Atrial fibrillation     a. Dx 05/2014 in setting of seizure, PNA. S/p TEE/DCCV.  Marland Kitchen PFO (patent foramen ovale)     a. 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  . Bowel perforation     a. transvere and R colectomy and ileostomy in 12/25/13.  . Obesity     PAST SURGICAL HISTORY: Past Surgical History  Procedure Laterality Date  . Colonoscopy  03/26/2012    Procedure: COLONOSCOPY;  Surgeon: Beryle Beams, MD;  Location: [facility];  Service: Endoscopy;  Laterality: N/A;  . Tonsillectomy      as child  . Flexible sigmoidoscopy N/A 12/09/2013    Procedure: FLEXIBLE SIGMOIDOSCOPY;  Surgeon: Beryle Beams, MD;  Location: [facility]OS[facility];  Service: Endoscopy;  Laterality: N/A;  . Laparotomy N/A 12/25/2013    Procedure: EXPLORATORY LAPAROTOMY/Transverse and right coloectomy/ end  ileostomy and mucus  fistula;  Surgeon: Ralene Ok, MD;  Location: [facility];  Service: General;  Laterality: N/A;  . Ankle fracture surgery  09/04/13  . Colostomy  12-25-13    Ruptured colon  . Tee without cardioversion N/A 05/24/2014    Procedure: TRANSESOPHAGEAL ECHOCARDIOGRAM (TEE);  Surgeon: Josue Hector, MD;  Location[facility]COPY;  Service: Cardiovascular;  Laterality: N/A;  . Cardioversion N/A 05/24/2014     Procedure: CARDIOVERSION;  Surgeon: Josue Hector, MD;  Location: [facility] ENDOSCOPY;  Service: Cardiovascular;  Laterality: N/A;    FAMILY HISTORY: Family History  Problem Relation Age of Onset  . Vision loss Father   . Hypertension Father   . Diabetes Father   . Heart disease Father   .  Cancer Father     colon  . Diabetes Mother   . Hypertension Mother   . Heart disease Mother   . Dementia Mother   . Cancer Mother     breast ca  . Diabetes Sister   . Hyperlipidemia Sister   . Hypertension Sister   . Diabetes Brother   . Kidney disease Brother   . Heart disease Brother     SOCIAL HISTORY: Social History   Social History  . Marital Status: Single    Spouse Name: N/A  . Number of Children: 0  . Years of Education: HS   Occupational History  . Not on file.   Social History Main Topics  . Smoking status: Former Smoker    Types: Pipe    Quit date: 08/10/2013  . Smokeless tobacco: Never Used  . Alcohol Use: No  . Drug Use: No  . Sexual Activity: Not on file   Other Topics Concern  . Not on file   Social History Narrative   Patient is single and lives with his sister.   Patient is retired from [employer].   Patient has a high school education.   Patient drinks one cup of caffeine daily.   Patient is right-handed.            PHYSICAL EXAM  Filed Vitals:   04/04/15 1016  BP: 118/75  Pulse: 78  Height: 6\' 5"  (1.956 m)   There is no weight on file to calculate BMI.  Generalized: Well developed, in no acute distress   Neurological examination  Mentation: Alert oriented to time, place, history taking. Follows all commands speech and language fluent Cranial nerve II-XII: Pupils were equal round reactive to light. Extraocular movements were full, visual field were full on confrontational test. Facial sensation and strength were normal. Uvula tongue midline. Head turning and shoulder shrug  were normal and symmetric. Motor: The motor testing reveals 5 over 5  strength in the upper extreme wrist. 4/5 strength in the lower extremites. Patient unable to bear weight.Kermit Balo symmetric motor tone is noted throughout.  Sensory: Sensory testing is intact to soft touch on all 4 extremities. Pinprick sensation decreased in the lower extremity is in a stocking-like pattern. No evidence of extinction is noted.  Coordination: Cerebellar testing reveals good finger-nose-finger and heel-to-shin bilaterally.  Gait and station: Patient is unable to bear weight. He arrived today on a stretcher. Reflexes: Deep tendon reflexes are symmetric and normal bilaterally.   DIAGNOSTIC DATA (LABS, IMAGING, TESTING) - I reviewed patient records, labs, notes, testing and imaging myself where available.  Lab Results  Component Value Date   WBC 5.4 10/03/2014   HGB 14.9 05/24/2014   HCT 45.6 10/03/2014   MCV 93.6 05/24/2014   PLT 150 05/24/2014      Component Value Date/Time   NA 142 10/03/2014 1606   NA 135 05/24/2014 0340   K 4.4 10/03/2014 1606   CL 103 10/03/2014 1606   CO2 26 10/03/2014 1606   GLUCOSE 117* 10/03/2014 1606   GLUCOSE 88 05/24/2014 0340   BUN 16 10/03/2014 1606   BUN 11 05/24/2014 0340   CREATININE 0.60* 10/03/2014 1606   CALCIUM 8.6 10/03/2014 1606   PROT 6.3 10/03/2014 1606   PROT 6.3 05/20/2014 1626   ALBUMIN 3.7 10/03/2014 1606   ALBUMIN 2.9* 05/22/2014 0257   AST 20 10/03/2014 1606   ALT 36 10/03/2014 1606   ALKPHOS 158* 10/03/2014 1606   BILITOT <0.2 10/03/2014 1606  BILITOT 0.3 05/20/2014 1626   GFRNONAA 108 10/03/2014 1606   GFRAA 125 10/03/2014 1606   Lab Results  Component Value Date   CHOL 136 10/17/2013   HDL 45 10/17/2013   LDLCALC 78 10/17/2013   TRIG 65 10/17/2013   Lab Results  Component Value Date   HGBA1C 5.1 01/30/2014    Lab Results  Component Value Date   TSH 1.667 05/22/2014      ASSESSMENT AND PLAN 64 y.o. year old male  has a past medical history of Depression; Seizures; Deep vein thrombophlebitis of  left leg; Deep vein thrombosis of right lower extremity; Psoriasis; Arthritis; DVT (deep venous thrombosis); Weakness of both legs; Peripheral neuropathy; Venous stasis; Hyperplasia of prostate; Hyperlipidemia; Atrial fibrillation; PFO (patent foramen ovale); Bowel perforation; and Obesity. here with:  1. Seizures 2. Peripheral neuropathy  The patient will continue on Dilantin and Keppra. I will check blood work today. Patient will continue using Gralise forr neuropathy discomfort. He has followed up with several pain specialists but they cannot offer any additional treatment. Patient advised that if his symptoms worsen or he develops new symptoms he should let us know. He should also let us know if he continues to have seizures. He will follow-up in 6 months with Dr. Brett Fairy.   Ward Givens, MSN, NP-C 04/04/2015, 10:11 AM [facility] [address], [place], [place] [address] [phone]

## 2015-04-04 NOTE — Patient Instructions (Signed)
Continue Keppra and Dilantin  I will check blood work today.  Continue Gralise.  If your symptoms worsen or you develop new symptoms please let us know.

## 2015-04-05 LAB — CBC WITH DIFFERENTIAL/PLATELET
BASOS: 0 %
Basophils Absolute: 0 10*3/uL (ref 0.0–0.2)
EOS (ABSOLUTE): 0.4 10*3/uL (ref 0.0–0.4)
Eos: 5 %
Hematocrit: 45.5 % (ref 37.5–51.0)
Hemoglobin: 15.7 g/dL (ref 12.6–17.7)
Immature Grans (Abs): 0 10*3/uL (ref 0.0–0.1)
Immature Granulocytes: 0 %
LYMPHS ABS: 1.2 10*3/uL (ref 0.7–3.1)
Lymphs: 16 %
MCH: 32.2 pg (ref 26.6–33.0)
MCHC: 34.5 g/dL (ref 31.5–35.7)
MCV: 93 fL (ref 79–97)
MONOS ABS: 0.6 10*3/uL (ref 0.1–0.9)
Monocytes: 7 %
NEUTROS ABS: 5.7 10*3/uL (ref 1.4–7.0)
Neutrophils: 72 %
PLATELETS: 200 10*3/uL (ref 150–379)
RBC: 4.88 x10E6/uL (ref 4.14–5.80)
RDW: 13.6 % (ref 12.3–15.4)
WBC: 7.9 10*3/uL (ref 3.4–10.8)

## 2015-04-05 LAB — COMPREHENSIVE METABOLIC PANEL
A/G RATIO: 1.4 (ref 1.1–2.5)
ALT: 43 IU/L (ref 0–44)
AST: 25 IU/L (ref 0–40)
Albumin: 3.7 g/dL (ref 3.6–4.8)
Alkaline Phosphatase: 158 IU/L — ABNORMAL HIGH (ref 39–117)
BUN/Creatinine Ratio: 26 — ABNORMAL HIGH (ref 10–22)
BUN: 14 mg/dL (ref 8–27)
CALCIUM: 8.7 mg/dL (ref 8.6–10.2)
CO2: 21 mmol/L (ref 18–29)
Chloride: 103 mmol/L (ref 97–106)
Creatinine, Ser: 0.53 mg/dL — ABNORMAL LOW (ref 0.76–1.27)
GFR, EST AFRICAN AMERICAN: 131 mL/min/{1.73_m2} (ref >59)
GFR, EST NON AFRICAN AMERICAN: 113 mL/min/{1.73_m2} (ref >59)
Globulin, Total: 2.6 g/dL (ref 1.5–4.5)
Glucose: 111 mg/dL — ABNORMAL HIGH (ref 65–99)
POTASSIUM: 4.4 mmol/L (ref 3.5–5.2)
SODIUM: 140 mmol/L (ref 136–144)
TOTAL PROTEIN: 6.3 g/dL (ref 6.0–8.5)

## 2015-04-05 LAB — PHENYTOIN LEVEL, TOTAL: Phenytoin (Dilantin), Serum: 8.8 ug/mL — ABNORMAL LOW (ref 10.0–20.0)

## 2015-04-09 ENCOUNTER — Telehealth: Payer: Self-pay | Admitting: *Deleted

## 2015-04-09 NOTE — Telephone Encounter (Signed)
Noted. Thanks.

## 2015-04-09 NOTE — Telephone Encounter (Signed)
-----   Message from Ward Givens, NP sent at 04/09/2015  7:46 AM EST ----- Dilantin level slightly low. Patient is already on max dose of dilantin. We not make any adjustments at this time. Please inquire if the patient has had any additional seizures since the office visit.thanks.

## 2015-04-09 NOTE — Telephone Encounter (Signed)
Spoke to patient and his wife - both aware of results. He denies any seizure activity since his last office visit.

## 2015-04-15 ENCOUNTER — Emergency Department (HOSPITAL_COMMUNITY)
Admission: EM | Admit: 2015-04-15 | Discharge: 2015-04-15 | Disposition: A | Discharge status: Home / Self Care | Payer: Medicare Other | Attending: Emergency Medicine | Admitting: Emergency Medicine

## 2015-04-15 ENCOUNTER — Emergency Department (HOSPITAL_COMMUNITY): Payer: Medicare Other

## 2015-04-15 ENCOUNTER — Encounter (HOSPITAL_COMMUNITY): Payer: Self-pay | Admitting: Emergency Medicine

## 2015-04-15 DIAGNOSIS — I4891 Unspecified atrial fibrillation: Secondary | ICD-10-CM | POA: Insufficient documentation

## 2015-04-15 DIAGNOSIS — Q211 Atrial septal defect: Secondary | ICD-10-CM | POA: Diagnosis not present

## 2015-04-15 DIAGNOSIS — Z7902 Long term (current) use of antithrombotics/antiplatelets: Secondary | ICD-10-CM | POA: Insufficient documentation

## 2015-04-15 DIAGNOSIS — E785 Hyperlipidemia, unspecified: Secondary | ICD-10-CM | POA: Insufficient documentation

## 2015-04-15 DIAGNOSIS — Z880 Allergy status to penicillin: Secondary | ICD-10-CM | POA: Insufficient documentation

## 2015-04-15 DIAGNOSIS — Z87891 Personal history of nicotine dependence: Secondary | ICD-10-CM | POA: Insufficient documentation

## 2015-04-15 DIAGNOSIS — Z8672 Personal history of thrombophlebitis: Secondary | ICD-10-CM | POA: Insufficient documentation

## 2015-04-15 DIAGNOSIS — Z872 Personal history of diseases of the skin and subcutaneous tissue: Secondary | ICD-10-CM | POA: Insufficient documentation

## 2015-04-15 DIAGNOSIS — N50819 Testicular pain, unspecified: Secondary | ICD-10-CM | POA: Diagnosis not present

## 2015-04-15 DIAGNOSIS — Z8739 Personal history of other diseases of the musculoskeletal system and connective tissue: Secondary | ICD-10-CM | POA: Diagnosis not present

## 2015-04-15 DIAGNOSIS — G629 Polyneuropathy, unspecified: Secondary | ICD-10-CM | POA: Insufficient documentation

## 2015-04-15 DIAGNOSIS — Z79899 Other long term (current) drug therapy: Secondary | ICD-10-CM | POA: Insufficient documentation

## 2015-04-15 DIAGNOSIS — R109 Unspecified abdominal pain: Secondary | ICD-10-CM | POA: Diagnosis not present

## 2015-04-15 DIAGNOSIS — R300 Dysuria: Secondary | ICD-10-CM | POA: Diagnosis not present

## 2015-04-15 DIAGNOSIS — Z933 Colostomy status: Secondary | ICD-10-CM | POA: Diagnosis not present

## 2015-04-15 DIAGNOSIS — Z86718 Personal history of other venous thrombosis and embolism: Secondary | ICD-10-CM | POA: Insufficient documentation

## 2015-04-15 DIAGNOSIS — F329 Major depressive disorder, single episode, unspecified: Secondary | ICD-10-CM | POA: Diagnosis not present

## 2015-04-15 DIAGNOSIS — R1030 Lower abdominal pain, unspecified: Secondary | ICD-10-CM | POA: Diagnosis present

## 2015-04-15 DIAGNOSIS — E669 Obesity, unspecified: Secondary | ICD-10-CM | POA: Diagnosis not present

## 2015-04-15 DIAGNOSIS — R102 Pelvic and perineal pain: Secondary | ICD-10-CM | POA: Diagnosis not present

## 2015-04-15 LAB — BASIC METABOLIC PANEL
Anion gap: 5 (ref 5–15)
BUN: 13 mg/dL (ref 6–20)
CALCIUM: 8.8 mg/dL — AB (ref 8.9–10.3)
CO2: 28 mmol/L (ref 22–32)
Chloride: 105 mmol/L (ref 101–111)
Creatinine, Ser: 0.65 mg/dL (ref 0.61–1.24)
GFR calc Af Amer: >60 mL/min (ref >60)
GLUCOSE: 101 mg/dL — AB (ref 65–99)
Potassium: 4.5 mmol/L (ref 3.5–5.1)
Sodium: 138 mmol/L (ref 135–145)

## 2015-04-15 LAB — CBC WITH DIFFERENTIAL/PLATELET
Basophils Absolute: 0 10*3/uL (ref 0.0–0.1)
Basophils Relative: 0 %
EOS ABS: 0.4 10*3/uL (ref 0.0–0.7)
EOS PCT: 5 %
HCT: 48.4 % (ref 39.0–52.0)
Hemoglobin: 15.5 g/dL (ref 13.0–17.0)
LYMPHS ABS: 1.2 10*3/uL (ref 0.7–4.0)
LYMPHS PCT: 16 %
MCH: 31.5 pg (ref 26.0–34.0)
MCHC: 32 g/dL (ref 30.0–36.0)
MCV: 98.4 fL (ref 78.0–100.0)
MONOS PCT: 9 %
Monocytes Absolute: 0.6 10*3/uL (ref 0.1–1.0)
NEUTROS PCT: 70 %
Neutro Abs: 5.2 10*3/uL (ref 1.7–7.7)
PLATELETS: 173 10*3/uL (ref 150–400)
RBC: 4.92 MIL/uL (ref 4.22–5.81)
RDW: 13.7 % (ref 11.5–15.5)
WBC: 7.5 10*3/uL (ref 4.0–10.5)

## 2015-04-15 LAB — URINALYSIS, ROUTINE W REFLEX MICROSCOPIC
Bilirubin Urine: NEGATIVE
GLUCOSE, UA: NEGATIVE mg/dL
HGB URINE DIPSTICK: NEGATIVE
KETONES UR: NEGATIVE mg/dL
Leukocytes, UA: NEGATIVE
Nitrite: NEGATIVE
PH: 5 (ref 5.0–8.0)
PROTEIN: NEGATIVE mg/dL
Specific Gravity, Urine: 1.022 (ref 1.005–1.030)

## 2015-04-15 MED ORDER — ONDANSETRON HCL 4 MG/2ML IJ SOLN
4.0000 mg | Freq: Once | INTRAMUSCULAR | Status: AC
  Administered 2015-04-15: 4 mg via INTRAVENOUS
  Filled 2015-04-15: qty 2

## 2015-04-15 MED ORDER — SODIUM CHLORIDE 0.9 % IV BOLUS (SEPSIS)
500.0000 mL | Freq: Once | INTRAVENOUS | Status: AC
Start: 2015-04-15 — End: 2015-04-15
  Administered 2015-04-15: 500 mL via INTRAVENOUS

## 2015-04-15 MED ORDER — HYDROMORPHONE HCL 1 MG/ML IJ SOLN
1.0000 mg | Freq: Once | INTRAMUSCULAR | Status: AC
  Administered 2015-04-15: 1 mg via INTRAVENOUS
  Filled 2015-04-15: qty 1

## 2015-04-15 MED ORDER — CIPROFLOXACIN HCL 500 MG PO TABS: 500.0000 mg | ORAL_TABLET | Freq: Two times a day (BID) | ORAL | Status: DC

## 2015-04-15 MED ORDER — HYDROCODONE-ACETAMINOPHEN 5-325 MG PO TABS: 1.0000 | ORAL_TABLET | ORAL | Status: DC | PRN

## 2015-04-15 MED ORDER — IOHEXOL 300 MG/ML  SOLN
50.0000 mL | Freq: Once | INTRAMUSCULAR | Status: AC | PRN
  Administered 2015-04-15: 50 mL via ORAL

## 2015-04-15 MED ORDER — IOHEXOL 300 MG/ML  SOLN
100.0000 mL | Freq: Once | INTRAMUSCULAR | Status: AC | PRN
  Administered 2015-04-15: 100 mL via INTRAVENOUS

## 2015-04-15 NOTE — ED Provider Notes (Signed)
CSN: [id number]     Arrival date & time 04/15/15  [id number] History   First MD Initiated Contact with Patient 04/15/15 [phone]     Chief Complaint  Patient presents with  . Groin Pain     (Consider location/radiation/quality/duration/timing/severity/associated sxs/prior Treatment) HPI Comments: Patient presents to the emergency department for evaluation of lower abdomen and groin pain. Patient reports pain began approximately 2 AM. He feels a squeezing pain in his testicles. He also reports that he has had burning and pain with urination. He does report that the pain has been coming and going over the last 2 weeks, but significantly worsened tonight.  Patient is a 64 y.o. male presenting with groin pain.  Groin Pain Associated symptoms include abdominal pain.    Past Medical History  Diagnosis Date  . Depression   . Seizures ([place])   . 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 Deep vein thrombosis of right lower extremity ([place])   . Psoriasis   . Arthritis   . DVT (deep venous thrombosis) (HCC)     multiple times  . Weakness of both legs   . Peripheral neuropathy ([place])   . Venous stasis   . Hyperplasia of prostate   . Hyperlipidemia   . Atrial fibrillation ([place])     a. Dx 05/2014 in setting of seizure, PNA. S/p TEE/DCCV.  Marland Kitchen PFO (patent foramen ovale)     a. 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  . Bowel perforation ([place])     a. transvere and R colectomy and ileostomy in 12/25/13.  . Obesity    Past Surgical History  Procedure Laterality Date  . Colonoscopy  03/26/2012    Procedure: COLONOSCOPY;  Surgeon: Beryle Beams, MD;  Location: [facility];  Service: Endoscopy;  Laterality: N/A;  . Tonsillectomy      as child  . Flexible sigmoidoscopy N/A 12/09/2013    Procedure: FLEXIBLE SIGMOIDOSCOPY;  Surgeon: Beryle Beams, MD;  Location: [facility];  Service: Endoscopy;  Laterality: N/A;  . Laparotomy N/A 12/25/2013    Procedure:  EXPLORATORY LAPAROTOMY/Transverse and right coloectomy/ end  ileostomy and mucus  fistula;  Surgeon: Ralene Ok, MD;  Location: [place];  Service: General;  Laterality: N/A;  . Ankle fracture surgery  09/04/13  . Colostomy  12-25-13    Ruptured colon  . Tee without cardioversion N/A 05/24/2014    Procedure: TRANSESOPHAGEAL ECHOCARDIOGRAM (TEE);  Surgeon: Josue Hector, MD;  Location: [facility] ENDOSCOPY;  Service: Cardiovascular;  Laterality: N/A;  . Cardioversion N/A 05/24/2014    Procedure: CARDIOVERSION;  Surgeon: Josue Hector, MD;  Location: [facility]OSCOPY;  Service: Cardiovascular;  Laterality: N/A;   Family History  Problem Relation Age of Onset  . Vision loss Father   . Hypertension Father   . Diabetes Father   . Heart disease Father   . Cancer Father     colon  . Diabetes Mother   . Hypertension Mother   . Heart disease Mother   . Dementia Mother   . Cancer Mother     breast ca  . Diabetes Sister   . Hyperlipidemia Sister   . Hypertension Sister   . Diabetes Brother   . Kidney disease Brother   . Heart disease Brother    Social History  Substance Use Topics  . Smoking status: Former Smoker    Types: Pipe    Quit date: 08/10/2013  . Smokeless tobacco: Never Used  .  Alcohol Use: No    Review of Systems  Gastrointestinal: Positive for abdominal pain.  Genitourinary: Positive for dysuria and testicular pain.  All other systems reviewed and are negative.     Allergies  Penicillins  Home Medications   Prior to Admission medications   Medication Sig Start Date End Date Taking? Authorizing Provider  acetaminophen (TYLENOL) 500 MG tablet Take 1 tablet (500 mg total) by mouth 2 (two) times daily as needed for mild pain or fever. 02/02/14  Yes Modena Jansky, MD  Calcium Carb-Cholecalciferol (CALCIUM-VITAMIN D3) 600-500 MG-UNIT CAPS Take 1 capsule by mouth daily.    Yes Historical Provider, MD  dabigatran (PRADAXA) 150 MG CAPS capsule Take 1 capsule (150 mg total) by  mouth 2 (two) times daily. 10/24/14  Yes Pixie Casino, MD  diltiazem (CARDIZEM CD) 180 MG 24 hr capsule Take 1 capsule (180 mg total) by mouth daily. 06/15/14  Yes Dayna N Dunn, PA-C  doxepin (SINEQUAN) 75 MG capsule Take 150 capsules by mouth at bedtime.  10/16/14  Yes Historical Provider, MD  DULoxetine (CYMBALTA) 30 MG capsule Take 30 mg by mouth daily. Pt takes a total of 90 mg   Yes Historical Provider, MD  DULoxetine (CYMBALTA) 60 MG capsule Take 60 mg by mouth daily. Pt takes a total of 90mg    Yes Historical Provider, MD  GRALISE 600 MG TABS Take 600 mg by mouth 2 (two) times daily.  09/25/14  Yes Historical Provider, MD  levETIRAcetam (KEPPRA) 750 MG tablet Take 2 tablets (1,500 mg total) by mouth 3 (three) times daily. For seizures 10/03/14  Yes [name], MD  Multiple Vitamin (MULTIVITAMIN WITH MINERALS) TABS Take 1 tablet by mouth daily.   Yes Historical Provider, MD  phenytoin (DILANTIN) 100 MG ER capsule Take 4 capsules (400 mg total) by mouth at bedtime. 07/05/14  Yes Carmen Dohmeier, MD  pregabalin (LYRICA) 25 MG capsule Take 25 mg by mouth daily.   Yes Historical Provider, MD  vitamin B-12 (CYANOCOBALAMIN) 1000 MCG tablet Take 1,000 mcg by mouth daily. For supplement   Yes Historical Provider, MD  LORazepam (ATIVAN) 1 MG tablet Use 1 mg tab to break focal seizure activity. As needed, do not exceed 2 mg a day. Patient not taking: Reported on 04/15/2015 10/03/14   Asencion Partridge Dohmeier, MD   BP 132/70 mmHg  Pulse 74  Temp(Src) 98.8 F (37.1 C) (Oral)  Resp 20  Ht 6\' 5"  (1.956 m)  Wt 300 lb (136.079 kg)  BMI 35.57 kg/m2  SpO2 97% Physical Exam  Constitutional: He is oriented to person, place, and time. He appears well-developed and well-nourished. No distress.  HENT:  Head: Normocephalic and atraumatic.  Right Ear: Hearing normal.  Left Ear: Hearing normal.  Nose: Nose normal.  Mouth/Throat: Oropharynx is clear and moist and mucous membranes are normal.  Eyes: Conjunctivae and  EOM are normal. Pupils are equal, round, and reactive to light.  Neck: Normal range of motion. Neck supple.  Cardiovascular: Regular rhythm, S1 normal and S2 normal.  Exam reveals no gallop and no friction rub.   No murmur heard. Pulmonary/Chest: Effort normal and breath sounds normal. No respiratory distress. He exhibits no tenderness.  Abdominal: Soft. Normal appearance and bowel sounds are normal. There is no hepatosplenomegaly. There is no tenderness. There is no rebound, no guarding, no tenderness at McBurney's point and negative Murphy's sign. No hernia. Hernia confirmed negative in the right inguinal area and confirmed negative in the left inguinal area.  Ostomy presents  right side of her abdomen, stool present in bag, no bleeding  Genitourinary: Testes normal and penis normal. Right testis shows no mass, no swelling and no tenderness. Left testis shows no mass, no swelling and no tenderness.  Musculoskeletal: Normal range of motion.  Neurological: He is alert and oriented to person, place, and time. He has normal strength. No cranial nerve deficit or sensory deficit. Coordination normal. GCS eye subscore is 4. GCS verbal subscore is 5. GCS motor subscore is 6.  Skin: Skin is warm, dry and intact. No rash noted. No cyanosis.  Psychiatric: He has a normal mood and affect. His speech is normal and behavior is normal. Thought content normal.  Nursing note and vitals reviewed.   ED Course  Procedures (including critical care time) Labs Review Labs Reviewed  BASIC METABOLIC PANEL - Abnormal; Notable for the following:    Glucose, Bld 101 (*)    Calcium 8.8 (*)    All other components within normal limits  CBC WITH DIFFERENTIAL/PLATELET  URINALYSIS, ROUTINE W REFLEX MICROSCOPIC (NOT AT [facility])    Imaging Review Ct Abdomen Pelvis W Contrast  04/15/2015  CLINICAL DATA:  Right lower quadrant pain radiating to the groin. EXAM: CT ABDOMEN AND PELVIS WITH CONTRAST TECHNIQUE: Multidetector CT  imaging of the abdomen and pelvis was performed using the standard protocol following bolus administration of intravenous contrast. CONTRAST:  32mL OMNIPAQUE IOHEXOL 300 MG/ML SOLN, 124mL OMNIPAQUE IOHEXOL 300 MG/ML SOLN COMPARISON:  01/30/2014 FINDINGS: There are at least 2 small calculi within the gallbladder lumen. There is no bile duct dilatation. There are normal appearances of the liver except for a right hepatic lobe hypodensity which is too small for definitive characterization but more likely benign. There is a low-attenuation 3 cm lesion in the spleen which has significantly decreased in size from 01/30/2014, with otherwise normal appearances of the spleen. The pancreas and adrenals appear normal. The kidneys appear normal except for a stable 10 mm presumed cyst at the lateral aspect of the right kidney. There is no urinary calculus. There is no hydronephrosis or ureteral dilatation. There is a small hiatal hernia. Stomach is otherwise unremarkable. Small bowel is unremarkable. Right abdominal ileostomy me as unremarkable. Residual colon and colostomy appear unremarkable. There is no bowel obstruction. There is no focal inflammatory change in the abdomen or pelvis. There is no adenopathy. There is no ascites. The abdominal aorta is normal in caliber with minimal atherosclerotic calcification. Mild linear opacities in the posterior right base likely represent scarring or minimal atelectasis. IMPRESSION: 1. Cholelithiasis 2. Hypodense splenic lesion, decreased in size from 01/30/2014. 3. No evidence of ureteral obstruction or ureteral calculus. 4. Hiatal hernia. 5. Negative for bowel obstruction, perforation or focal inflammatory change. 6. Mild linear basilar right lung base opacity, likely scarring or minimal atelectasis. Electronically Signed   By: Andreas Newport M.D.   On: 04/15/2015 06:58   I have personally reviewed and evaluated these images and lab results as part of my medical  decision-making.   EKG Interpretation None      MDM   Final diagnoses:  None  dysuria abd pain   Presents to the ER for evaluation of lower abdominal, pelvic pain with dysuria and testicle pain. He reports that the pain has been coming and going for weeks, but overnight has intensified and become constant. Genital examination was unremarkable, however. There is no testicular tenderness or swelling. Abdominal exam was benign, nontender. He does, however, have a history of colostomy secondary to bowel  rupture. CT scan was therefore performed. No acute pathology was seen in the abdomen or pelvis. There is a gallstone present, but this does not explain the pelvic discomfort. No evidence of kidney stone or ureteral obstruction. Blood work was also normal. Urinalysis entirely normal. I do need to consider the possibility of genitourinary infection such as epididymitis and prostatitis, will empirically treat.    Orpah Greek, MD 04/15/15 [phone]

## 2015-04-15 NOTE — Discharge Instructions (Signed)

## 2015-04-15 NOTE — ED Notes (Signed)
Per EMS pt co RLQ sharp pain radiating to right groin and pt states it feels like his testicles are in a vice; increased pain with movement; onset 2am ; pt hx ruptured colon and with ostomy bag; pt bed-bound d/t peripheral neuropathy; hx epilepsy and Afib

## 2015-04-15 NOTE — ED Notes (Signed)
Called ptar waiting on transport, wife has d/c papers

## 2015-04-16 LAB — URINE CULTURE: CULTURE: NO GROWTH

## 2015-04-23 ENCOUNTER — Ambulatory Visit: Payer: Medicare Other | Admitting: Internal Medicine

## 2015-05-01 ENCOUNTER — Other Ambulatory Visit: Payer: Self-pay | Admitting: Neurology

## 2015-05-22 ENCOUNTER — Ambulatory Visit: Payer: Medicare Other | Admitting: Internal Medicine

## 2015-06-14 ENCOUNTER — Ambulatory Visit (INDEPENDENT_AMBULATORY_CARE_PROVIDER_SITE_OTHER): Payer: Medicare Other | Admitting: Internal Medicine

## 2015-06-14 ENCOUNTER — Encounter: Payer: Self-pay | Admitting: Internal Medicine

## 2015-06-14 VITALS — BP 114/76 | HR 65 | Ht 77.0 in | Wt 300.0 lb

## 2015-06-14 DIAGNOSIS — E669 Obesity, unspecified: Secondary | ICD-10-CM

## 2015-06-14 DIAGNOSIS — Z86718 Personal history of other venous thrombosis and embolism: Secondary | ICD-10-CM

## 2015-06-14 DIAGNOSIS — I480 Paroxysmal atrial fibrillation: Secondary | ICD-10-CM | POA: Diagnosis not present

## 2015-06-14 DIAGNOSIS — Z7901 Long term (current) use of anticoagulants: Secondary | ICD-10-CM | POA: Diagnosis not present

## 2015-06-14 NOTE — Progress Notes (Signed)
OFFICE NOTE  Chief Complaint:  No complaints  Primary Care Physician: Cammy Copa, MD  HPI:  [name] is a 65 y.o. 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 He was apparently excessively sedated and he came obtunded requiring intubation for airway protection. He was also found to have a pneumonia. Earlier this morning he went into atrial fibrillation with rapid ventricular response. He is unaware of this. He has no history of A. fib that he is aware of. His mother did have a history of A. fib however she was very late in age with onset. He's denied any chest pain or worsening shortness of breath. Cardiology is asked to evaluate and recommend management for atrial fibrillation. Of note he is on chronic warfarin therapy for his DVT  [name] returns today for follow-up. He was seen in the office on a stretcher. He has difficulty getting to anticoagulation appointments at his primary care provider and they recommended follow-up in our office. I do not see if he has any contraindications to being on a direct oral anticoagulant. For some time he was on Xarelto, however he had a perforated viscus and ultimately had bleeding associated with that. He's very concerned about being on a medicine that cannot be reversed and is asking if there are any direct oral anticoagulant which could be reversed, as an alternative to warfarin.  [name] returns today for follow-up. He denies any chest pain or shortness of breath. He's had no bleeding problems on Pradaxa. The only concern about the medication as is cost. I offered alternatives, but they are hesitant to use those because they like the security of been able to reverse the possible  bleeding effects of the medication. His EKG today shows a sinus rhythm.  PMHx:  Past Medical History  Diagnosis Date  . Depression   . Seizures ([place])   . 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 Deep vein thrombosis of right lower extremity ([place])   . Psoriasis   . Arthritis   . DVT (deep venous thrombosis) (HCC)     multiple times  . Weakness of both legs   . Peripheral neuropathy ([place])   . Venous stasis   . Hyperplasia of prostate   . Hyperlipidemia   . Atrial fibrillation ([place])     a. Dx 05/2014 in setting of seizure, PNA. S/p TEE/DCCV.  Marland Kitchen PFO (patent foramen ovale)     a. 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  . Bowel perforation ([place])     a. transvere and R colectomy and ileostomy in 12/25/13.  . Obesity     Past Surgical History  Procedure Laterality Date  . Colonoscopy  03/26/2012    Procedure: COLONOSCOPY;  Surgeon: Beryle Beams, MD;  Location: [facility];  Service: Endoscopy;  Laterality: N/A;  . Tonsillectomy      as child  . Flexible sigmoidoscopy N/A 12/09/2013    Procedure: FLEXIBLE SIGMOIDOSCOPY;  Surgeon: Beryle Beams, MD;  Location: [facility];  Service: Endoscopy;  Laterality: N/A;  . Laparotomy N/A 12/25/2013    Procedure: EXPLORATORY LAPAROTOMY/Transverse and right coloectomy/ end  ileostomy and mucus  fistula;  Surgeon: Ralene Ok, MD;  Location: [place];  Service: General;  Laterality: N/A;  . Ankle fracture surgery  09/04/13  . Colostomy  12-25-13    Ruptured colon  . Tee without cardioversion N/A 05/24/2014    Procedure: TRANSESOPHAGEAL ECHOCARDIOGRAM (TEE);  Surgeon: Josue Hector, MD;  Location[facility] ENDOSCOPY;  Service: Cardiovascular;  Laterality: N/A;  . Cardioversion N/A 05/24/2014    Procedure: CARDIOVERSION;  Surgeon: Josue Hector, MD;  Location: [facility] ENDOSCOPY;  Service: Cardiovascular;  Laterality: N/A;    FAMHx:  Family History  Problem Relation Age of Onset  . Vision loss Father     . Hypertension Father   . Diabetes Father   . Heart disease Father   . Cancer Father     colon  . Diabetes Mother   . Hypertension Mother   . Heart disease Mother   . Dementia Mother   . Cancer Mother     breast ca  . Diabetes Sister   . Hyperlipidemia Sister   . Hypertension Sister   . Diabetes Brother   . Kidney disease Brother   . Heart disease Brother     SOCHx:   reports that he quit smoking about 22 months ago. His smoking use included Pipe. He has never used smokeless tobacco. He reports that he does not drink alcohol or use illicit drugs.  ALLERGIES:  Allergies  Allergen Reactions  . Penicillins     Mother and brother have had severe allergic reactions, no personal reaction    ROS: Pertinent items noted in HPI and remainder of comprehensive ROS otherwise negative.  HOME MEDS: Current Outpatient Prescriptions  Medication Sig Dispense Refill  . acetaminophen (TYLENOL) 500 MG tablet Take 1 tablet (500 mg total) by mouth 2 (two) times daily as needed for mild pain or fever. 30 tablet 0  . Calcium Carb-Cholecalciferol (CALCIUM-VITAMIN D3) 600-500 MG-UNIT CAPS Take 1 capsule by mouth daily.     . dabigatran (PRADAXA) 150 MG CAPS capsule Take 1 capsule (150 mg total) by mouth 2 (two) times daily. 60 capsule 6  . diltiazem (CARDIZEM CD) 180 MG 24 hr capsule Take 1 capsule (180 mg total) by mouth daily. 30 capsule 11  . doxepin (SINEQUAN) 75 MG capsule Take 150 capsules by mouth at bedtime.   5  . DULoxetine (CYMBALTA) 30 MG capsule Take 30 mg by mouth daily. Pt takes a total of 90 mg    . DULoxetine (CYMBALTA) 60 MG capsule Take 60 mg by mouth daily. Pt takes a total of 90mg     . GRALISE 600 MG TABS TAKE 1 TABLET EVERY MORNING AND TAKE 1 TABLET AT LUNCH TAKE 2 TABLETS EVERY EVENING (Patient taking differently: TAKE 1 TABLET EVERY MORNING AND TAKE 1 TABLET AT LUNCH) 120 tablet 6  . levETIRAcetam (KEPPRA) 750 MG tablet Take 2 tablets (1,500 mg total) by mouth 3 (three)  times daily. For seizures 540 tablet 1  . LORazepam (ATIVAN) 1 MG tablet Use 1 mg tab to break focal seizure activity. As needed, do not exceed 2 mg a day. 30 tablet 0  . LYRICA 50 MG capsule Take 50 mg by mouth 3 (three) times daily.  0  . Multiple Vitamin (MULTIVITAMIN WITH MINERALS) TABS Take 1 tablet by mouth daily.    . phenytoin (DILANTIN) 100 MG ER capsule Take 4 capsules (400 mg total) by mouth at bedtime. 360 capsule 3  . vitamin B-12 (CYANOCOBALAMIN) 1000 MCG tablet Take 1,000 mcg by mouth daily. For supplement     No current facility-administered medications for  this visit.    LABS/IMAGING: No results found for this or any previous visit (from the past 48 hour(s)). No results found.  WEIGHTS: Wt Readings from Last 3 Encounters:  06/14/15 300 lb (136.079 kg)  04/15/15 300 lb (136.079 kg)  10/24/14 280 lb (127.007 kg)    VITALS: BP 114/76 mmHg  Pulse 65  Ht 6\' 5"  (1.956 m)  Wt 300 lb (136.079 kg)  BMI 35.57 kg/m2  EXAM: General appearance: alert, no distress and Lying supine on a stretcher Neck: no carotid bruit, no JVD and thyroid not enlarged, symmetric, no tenderness/mass/nodules Lungs: clear to auscultation bilaterally Heart: regular rate and rhythm, S1, S2 normal, no murmur, click, rub or gallop Abdomen: soft, non-tender; bowel sounds normal; no masses,  no organomegaly Extremities: extremities normal, atraumatic, no cyanosis or edema Pulses: 2+ and symmetric Skin: Skin color, texture, turgor normal. No rashes or lesions Neurologic: Grossly normal Psych: Pleasant  EKG: Normal sinus rhythm at 65  ASSESSMENT: 1. PAF-maintaining sinus 2. History of DVT on warfarin 3. Nonambulatory 4. Dyslipidemia  PLAN: 1.   [name] has a long-term need for anticoagulation. He has done well on Pradaxa. I'll provide him with a co-pay card which should allow 1 free prescription a year but will not provide a discount based on Medicare status. I also will assist them in  applying for financial assistance. He should stay on this medication is a good blood thinner for him. I'm happy see him back annually or sooner as necessary.  Pixie Casino, MD, [facility] Attending Cardiologist [place] 06/14/2015, 2:51 PM

## 2015-06-14 NOTE — Patient Instructions (Signed)
Dr Hilty recommends that you schedule a follow-up appointment in 1 year. You will receive a reminder letter in the mail two months in advance. If you don't receive a letter, please call our office to schedule the follow-up appointment.  If you need a refill on your cardiac medications before your next appointment, please call your pharmacy. 

## 2015-06-20 ENCOUNTER — Other Ambulatory Visit: Payer: Self-pay | Admitting: Physician Assistant

## 2015-06-20 NOTE — Telephone Encounter (Signed)
Rx request sent to pharmacy.  

## 2015-06-21 ENCOUNTER — Other Ambulatory Visit: Payer: Self-pay | Admitting: Internal Medicine

## 2015-06-21 NOTE — Telephone Encounter (Signed)
Rx request sent to pharmacy.  

## 2015-06-29 ENCOUNTER — Other Ambulatory Visit: Payer: Self-pay | Admitting: Neurology

## 2015-07-26 ENCOUNTER — Other Ambulatory Visit: Payer: Self-pay | Admitting: Neurology

## 2015-09-19 ENCOUNTER — Telehealth: Payer: Self-pay

## 2015-09-19 ENCOUNTER — Encounter: Payer: Self-pay | Admitting: Neurology

## 2015-09-19 NOTE — Telephone Encounter (Signed)
PA for gralise was completed and sent to [facility]. Should receive a determination in 3 business days.

## 2015-09-24 ENCOUNTER — Other Ambulatory Visit: Payer: Self-pay

## 2015-09-24 ENCOUNTER — Telehealth: Payer: Self-pay

## 2015-09-24 MED ORDER — GABAPENTIN (ONCE-DAILY) 600 MG PO TABS: ORAL_TABLET | ORAL | Status: DC

## 2015-09-24 NOTE — Telephone Encounter (Signed)
[employer] has denied coverage of Gralise. Coverage is only provided in situations when Gralise is being prescribed for treatment of postherpetic neuralgia or a CMS-approved compendia supported indication otherwise excluded from Part D, when the pt has tried gabapentin.  Of note, pt has tried and failed gabapentin. He has tried to see pain specialists but they have offered no help for his neuropathy.  Would you like me to complete an appeal or do you recommend a different medication?

## 2015-09-24 NOTE — Telephone Encounter (Signed)
Appeal, please. CD

## 2015-09-25 NOTE — Telephone Encounter (Signed)
Appeal was faxed yesterday to [employer]. Should have a determination in 7 business days.

## 2015-09-27 NOTE — Telephone Encounter (Signed)
Chemistry see if HORIZON   may be covered instead of Gralise

## 2015-09-27 NOTE — Telephone Encounter (Signed)
Alex with [facility] on [address] is calling to see if there is an alternative medication to Gabapentin, Once-Daily, (GRALISE) 600 MG TABS for the patient since it has been denied.

## 2015-09-27 NOTE — Telephone Encounter (Signed)
The prior auth and the appeal were both denied for pt's Gralise. Medicare Pard D will not cover Gralise unless it is prescribed for postherpetic neuralgia.  Pt has tried and failed gabapentin.  Do you recommend any other medications for this pt's painful peripheral neuropathy? Out of pocket this drug will cost the patient between $600 and $700.

## 2015-10-01 NOTE — Telephone Encounter (Signed)
Horizant will also not be covered by Medicare. Medicare will not approve 'off label" uses. Pt has an appt tomorrow with Dr. Brett Fairy. Pt should discuss with her then on how to proceed since Gralise and Horizant will not be covered. Perhaps Lyrica is another option.

## 2015-10-02 ENCOUNTER — Encounter: Payer: Self-pay | Admitting: Neurology

## 2015-10-02 ENCOUNTER — Telehealth: Payer: Self-pay | Admitting: *Deleted

## 2015-10-02 ENCOUNTER — Ambulatory Visit (INDEPENDENT_AMBULATORY_CARE_PROVIDER_SITE_OTHER): Payer: Medicare Other | Admitting: Neurology

## 2015-10-02 VITALS — BP 130/70 | HR 84 | Resp 20

## 2015-10-02 DIAGNOSIS — G629 Polyneuropathy, unspecified: Secondary | ICD-10-CM

## 2015-10-02 MED ORDER — HYDROCODONE-ACETAMINOPHEN 5-325 MG PO TABS: 1.0000 | ORAL_TABLET | Freq: Four times a day (QID) | ORAL | Status: DC | PRN

## 2015-10-02 MED ORDER — GABAPENTIN 600 MG PO TABS: 600.0000 mg | ORAL_TABLET | Freq: Two times a day (BID) | ORAL | Status: DC

## 2015-10-02 NOTE — Telephone Encounter (Signed)
Spoke to pt's sister (per DPR). She says that the new prescription for gabapentin that was discussed starting in the office visit today with Dr. Brett Fairy was not [facility] to the [facility] on [address]. I checked pt's medication list, but the only gabapentin listed is the Gralise, which is not going to be covered by pt's insurance.  Please order the correct gabapentin RX that was discussed in the office visit today. Pt's sister reports that it was supposed to be taken one tablet in the morning and two tablets at night.

## 2015-10-02 NOTE — Progress Notes (Signed)
PATIENT: [name] DOB: [birth year]  REASON FOR VISIT: follow up- seizures, neuropathyk HISTORY FROM: patient  HISTORY OF PRESENT ILLNESS: [name] is a 65 year old male with a history of epilepsy and neuropathy. He returns today for follow-up. The patient arrives today on a stretcher. He is currently on Keppra 1500 milligrams 3 times a day for seizures. He is also on Dilantin 400 mg at bedtime. His sister reports that he's did not have any seizures for several months until last week. She states that last week he had 3 absence seizures. Patient denies being sick, denies sleep deprivation and denies missing any medication during this timeframe. The sister does state that his PCP increased his Cymbalta during this time. She states that since then he has not had any additional seizures. Patient states that he continues to have discomfort in the lower extremities. He continues to use Gralise and Cymbalta. As mentioned previously his Cymbalta was increased by his primary care provider. The patient is completely bedridden. He states that his lower extremities are very weak limiting his mobility. The patient did have physical therapy but unfortunatelydid not progress as they desire. The patient denies urinary incontinence. He uses a urinal when needed. The patient has an ostomy bag. I discussed a motorized wheelchair and possible lift chair for the patient. However the patient states that he is more comfortable laying down. Patient and his sister deny any new neurological symptoms. He returns today for an evaluation.   HISTORY 10/03/14 [facility]): [name] is a 65 y.o. male, presenting on a stretcher, seen here as a referral from Dr. Ashby Dawes for new evaluation of Epilepsy, Neuropathy .   This patient has followed with Dr Erling Cruz prior to his retirement and was last seen 02-11-2011.  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 His head would turn to the left repeatedly , he did not lose awareness. The patient was placed on Dilantin brand name under milligrams 3 times a day alternating with 100 mg 4 times a day every other day. He was also on Keppra in the generic form of levetiracetam 1500 mg 3 times a day. He had been tried on topiramate but it caused him to have a stomach upset. Since the patient has severe neuropathy and chronic venostasis he has also been unsteady on his feet and a high fall risk was assessed.  He has psoriasis and easily bruises he fell in his home and broke his left ankle in a fall in February 2012 . he now fell , stumbling over his walker and broke his right ankle April 2015, he spent a month in rehabilitation, developed a megacolon, he has a urinary catheter - Foley infected -he had to be readmitted with urosepsis, he has a colostomy bag which also has let to skin lacerations while in a facility not adequately cared for.  His sister seems to be the one to change and since she Provides skin care he has no repeated infections, the folwy has been replaced. The last lab level was obtained through Dr. Erling Cruz on 11-19-2010 with a normal CBC complex metabolic panel and a phenytoin level of 6. He also ordered a DEXA scan last 2008 and that was normal. A normal bone density test from March 2013 was normal -according to Dr. Mathis Fare note . He has progressive numbness in his legs and feet but weaning him off Dilantin was not successful  and increased his seizure frequency. It is likely that his neuropathy is partially caused by Dilantin.  Interval history on 07-05-14. [name] was admitted in status epilepticus on 05/17/2014 to the [facility] and followed by Dr. Cruzita Lederer,  He underwent multiple multiple brain scans which did not show an intracranial abnormality it was also noted that he was unable to swallow any medications during his status and  therefore received Dilantin and Keppra intravenously. He was intubated for a couple of days. The status epilepticus was broken by being given IV medications. He is back on Dilantin and I have increased that in his last visit to 400 mg at night instead of alternating 300 with 400 we will resume this today. I refill his Keppra today as well as his Neurontin which I further increased by 600 mg. Neurontin also an antiepileptic medication is used in his case to treat his significant sensory abnormalities related to a neuropathy. What is remarkable to me is that the patient has almost no control over his lower extremities and lost all muscle tone by his upper extremities provide good grip strength and normal movement and tone. A nerve conduction study and EMG with Dr. Jannifer Franklin just documented neuropathy. The admission to the hospital was precipitated by developing pneumonia. The CT scan of the head was reviewed and the report is copied to today's note. I have discussed with the patient today that I would like for him to undergo a thoracic spine MRI if possible his last brain MRI by my records is from 2004, ordered by Dr. Jannifer Franklin at the time. In addition I do not need a brain MRI at this point I think that it is clear that the pneumonia cost him to have the seizures which is not an unknown or uncommon trigger. Any infection below the seizure threshold. What I would like to research further why his lower extremities are disproportionately affected by a neuropathy.  Interval history from 10-03-14. She no longer has physical therapy provided through [facility] payment, he and his sister are using some weight exercises at home. He has also healed from this macerated skin near the colostomy,. A honey-based product has been helpful in establishing better nor skin. Dr. Cornelious Bryant thousand patient and exchanged his nocturnal time dose of gabapentin with doxepin. This helped him to sleep. Does not help the pain. He also has a rectal pain  pulled endorsed neuropathy. I would like for Dr. Cornelious Bryant to have another visit with the patient also wondered if she couldperform any kind of study on the pudendal nerve?.CD  Interval history from 10/02/2015, [name] has not had any significant seizure activity over the last 12 months at least. His main problem is that due to his immobility and venostasis he has painful neuropathy, venostasis and leg ulcers.    REVIEW OF SYSTEMS: Out of a complete 14 system review of symptoms, the patient complains only of the following symptoms, and all other reviewed systems are negative.  Leg pain, muscle cramps, seizures, ulcers from venous stasis .  ALLERGIES: Allergies  Allergen Reactions  . Penicillins     Mother and brother have had severe allergic reactions, no personal reaction    HOME MEDICATIONS: Outpatient Prescriptions Prior to Visit  Medication Sig Dispense Refill  . acetaminophen (TYLENOL) 500 MG tablet Take 1 tablet (500 mg total) by mouth 2 (two) times daily as needed for mild pain or fever. 30 tablet 0  . Calcium Carb-Cholecalciferol (CALCIUM-VITAMIN D3) 600-500 MG-UNIT CAPS Take 1 capsule  by mouth daily.     Marland Kitchen diltiazem (CARDIZEM CD) 180 MG 24 hr capsule TAKE 1 CAPSULE (180 MG TOTAL) BY MOUTH DAILY. 30 capsule 10  . doxepin (SINEQUAN) 75 MG capsule Take 150 capsules by mouth at bedtime.   5  . DULoxetine (CYMBALTA) 30 MG capsule Take 30 mg by mouth daily. Pt takes a total of 90 mg    . DULoxetine (CYMBALTA) 60 MG capsule Take 60 mg by mouth daily. Pt takes a total of 90mg     . Gabapentin, Once-Daily, (GRALISE) 600 MG TABS TAKE 1 TABLET EVERY MORNING AND TAKE 1 TABLET AT LUNCH TAKE 2 TABLETS EVERY EVENING 120 tablet 6  . levETIRAcetam (KEPPRA) 750 MG tablet TAKE 2 TABLETS (1,500 MG TOTAL) BY MOUTH 3 (THREE) TIMES DAILY. FOR SEIZURES 540 tablet 1  . LORazepam (ATIVAN) 1 MG tablet Use 1 mg tab to break focal seizure activity. As needed, do not exceed 2 mg a day. 30 tablet 0  .  Multiple Vitamin (MULTIVITAMIN WITH MINERALS) TABS Take 1 tablet by mouth daily.    . phenytoin (DILANTIN) 100 MG ER capsule TAKE 4 CAPSULES BY MOUTH AT BEDTIME 360 capsule 3  . PRADAXA 150 MG CAPS capsule TAKE 1 CAPSULE (150 MG TOTAL) BY MOUTH 2 (TWO) TIMES DAILY. 60 capsule 11  . vitamin B-12 (CYANOCOBALAMIN) 1000 MCG tablet Take 1,000 mcg by mouth daily. For supplement    . LYRICA 50 MG capsule Take 50 mg by mouth 3 (three) times daily.  0   No facility-administered medications prior to visit.    PAST MEDICAL HISTORY: Past Medical History  Diagnosis Date  . Depression   . Seizures ([place])   . 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 Deep vein thrombosis of right lower extremity ([place])   . Psoriasis   . Arthritis   . DVT (deep venous thrombosis) ([facility])     multiple times  . Weakness of both legs   . Peripheral neuropathy ([place])   . Venous stasis   . Hyperplasia of prostate   . Hyperlipidemia   . Atrial fibrillation ([place])     a. Dx 05/2014 in setting of seizure, PNA. S/p TEE/DCCV.  Marland Kitchen PFO (patent foramen ovale)     a. 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  . Bowel perforation ([place])     a. transvere and R colectomy and ileostomy in 12/25/13.  . Obesity     PAST SURGICAL HISTORY: Past Surgical History  Procedure Laterality Date  . Colonoscopy  03/26/2012    Procedure: COLONOSCOPY;  Surgeon: Beryle Beams, MD;  Location: [facility] ENDOSCOPY;  Service: Endoscopy;  Laterality: N/A;  . Tonsillectomy      as child  . Flexible sigmoidoscopy N/A 12/09/2013    Procedure: FLEXIBLE SIGMOIDOSCOPY;  Surgeon: Beryle Beams, MD;  Location: [facility] ENDOSCOPY;  Service: Endoscopy;  Laterality: N/A;  . Laparotomy N/A 12/25/2013    Procedure: EXPLORATORY LAPAROTOMY/Transverse and right coloectomy/ end  ileostomy and mucus  fistula;  Surgeon: Ralene Ok, MD;  Location: [facility];  Service: General;  Laterality: N/A;  . Ankle fracture surgery  09/04/13  .  Colostomy  12-25-13    Ruptured colon  . Tee without cardioversion N/A 05/24/2014    Procedure: TRANSESOPHAGEAL ECHOCARDIOGRAM (TEE);  Surgeon: Josue Hector, MD;  Location: [facility] ENDOSCOPY;  Service: Cardiovascular;  Laterality: N/A;  . Cardioversion N/A 05/24/2014    Procedure: CARDIOVERSION;  Surgeon: Josue Hector, MD;  Location: [facility] ENDOSCOPY;  Service: Cardiovascular;  Laterality: N/A;    FAMILY HISTORY: Family History  Problem Relation Age of Onset  . Vision loss Father   . Hypertension Father   . Diabetes Father   . Heart disease Father   . Cancer Father     colon  . Diabetes Mother   . Hypertension Mother   . Heart disease Mother   . Dementia Mother   . Cancer Mother     breast ca  . Diabetes Sister   . Hyperlipidemia Sister   . Hypertension Sister   . Diabetes Brother   . Kidney disease Brother   . Heart disease Brother     SOCIAL HISTORY: Social History   Social History  . Marital Status: Single    Spouse Name: N/A  . Number of Children: 0  . Years of Education: HS   Occupational History  . Not on file.   Social History Main Topics  . Smoking status: Former Smoker    Types: Pipe    Quit date: 08/10/2013  . Smokeless tobacco: Never Used  . Alcohol Use: No  . Drug Use: No  . Sexual Activity: Not on file   Other Topics Concern  . Not on file   Social History Narrative   Patient is single and lives with his sister.   Patient is retired from [employer].   Patient has a high school education.   Patient drinks one cup of caffeine daily.   Patient is right-handed.            PHYSICAL EXAM  Filed Vitals:   10/02/15 0956  BP: 130/70  Pulse: 84  Resp: 20   There is no weight on file to calculate BMI.  Generalized: Well developed, in no acute distress   Neurological examination  Mentation: Alert oriented to time, place, history taking. Follows all commands speech and language fluent Cranial nerve : Sense of taste and smell intact.  Pupils were  equal round reactive to light. Extraocular movements were full, visual field were full on confrontational test. Facial sensation and strength were normal. Uvula tongue midline. Head turning and shoulder shrug  were normal and symmetric. Motor: Normal  strength in the upper extreme wrist. No strength in feet rotation, pushing, knee felxion and hip rotation- 2-5 . Non antigravity.  Sensory: Sensory testing is intact to soft touch on the upper extremities only.  Pinprick sensation decreased to absent  in the lower extremity is in a stocking-like pattern.  UNable as usual to perform heel to shin. Gait and station:  He arrived today on a stretcher. Reflexes: Deep tendon reflexes are symmetric and normal bilaterally.   DIAGNOSTIC DATA (LABS, IMAGING, TESTING) - I reviewed patient records, labs, notes, testing and imaging myself where available.  Lab Results  Component Value Date   TSH 1.667 05/22/2014    ASSESSMENT AND PLAN  40 minute visit with immobilized patient  In pain, seeping weeping ulcers to the left lower extremity. Brought by ambulance.   66 y.o. year old male  has a past medical history of Depression; Seizures ([place]); Deep vein thrombophlebitis of left leg ([place]); Deep vein thrombosis of right lower extremity ([place]); Psoriasis; Arthritis; DVT (deep venous thrombosis) ([place]); Weakness of both legs; Peripheral neuropathy ([place]); Venous stasis; Hyperplasia of prostate; Hyperlipidemia; Atrial fibrillation ([place]); PFO (patent foramen ovale); Bowel perforation ([place]); and Obesity. here with:  1. Seizures 2. Peripheral neuropathy with pain  3. Vasculopathy, venostasis - Ulcers.  The patient will continue on Dilantin and Keppra. Patient cannot continue using Gralise forr neuropathy discomfort, It is no longer covered by his insurance therefore I will switch him back to gabapentin 600 mg immediate release. He will try 1 at lunch 2 at dinner. He still has significant pain at night and apparently was  seen by [facility] in [place], or a pain specialist, he was told that due to his chronic anticoagulation he is not a candidate for interventional injection etc and therefore has to be managed with oral medications or topicals. The pain specialist added that he had nothing to offer above or beyond what she is already on. . He has followed up with several pain specialists but they cannot offer any additional treatment. Patient's sister manages his medications and states that after a urinary tract infection and treatment in hospital her brother was prescribed hydrocodone APAP and this helps him at night with the pain the very best.  Since his sister manages the pain medication for him I think that the occasional hydrocodone APAP at night would out weigh the risks associated with the patient at this immobile, has a history of somewhat decreased respirations, morbid obesity, sleep apnea.  Marland Kitchen He should also let us know if he continues to have seizures. He will follow-up in 6 months with NP  Trinity Hyland, MD  10/02/2015, 10:18 AM [facility] [address], [place], [place] [address] [phone]

## 2015-10-02 NOTE — Telephone Encounter (Signed)
Message For: OFFICE               Taken 23-MAY-17 at 12:04PM by REP ------------------------------------------------------------ Lynford Citizen               CID [id number]  Patient [name]         Pt's Dr [facility]      Area Code 336 Phone# [phone] 5/22-AT PHARMACY NOW/THEY   DO NOT HAVE SCRIPT/                                  Disp:Y/N N If Y = C/B If No Response In 75minutes ============================================================

## 2015-10-03 ENCOUNTER — Ambulatory Visit: Payer: Medicare Other | Admitting: Neurology

## 2015-10-04 MED ORDER — GABAPENTIN 600 MG PO TABS: 600.0000 mg | ORAL_TABLET | Freq: Three times a day (TID) | ORAL | Status: DC

## 2015-10-04 NOTE — Telephone Encounter (Signed)
Pharmacy number has only 6 digits.

## 2015-10-04 NOTE — Addendum Note (Signed)
Addended by: [name] on: 10/04/2015 12:12 PM   Modules accepted: Orders

## 2015-10-04 NOTE — Telephone Encounter (Signed)
Alex with [facility] called needing PA on gabapentin (NEURONTIN) 600 MG tablet,  [phone]

## 2015-10-10 NOTE — Telephone Encounter (Signed)
I spoke to [facility]. They report that the gabapentin does not need a PA and in fact, the pt picked up the new gabapentin prescription from 10/04/15 already.

## 2015-11-26 ENCOUNTER — Other Ambulatory Visit: Payer: Self-pay | Admitting: Neurology

## 2015-11-26 NOTE — Telephone Encounter (Signed)
Who is the prescriber ?

## 2015-11-26 NOTE — Addendum Note (Signed)
Addended by: Lester Annona A on: 11/26/2015 05:12 PM   Modules accepted: Orders

## 2015-11-26 NOTE — Telephone Encounter (Signed)
Dr. Brett Fairy is the prescriber and and last prescribed 10/02/2015.

## 2015-11-27 MED ORDER — HYDROCODONE-ACETAMINOPHEN 5-325 MG PO TABS: 1.0000 | ORAL_TABLET | Freq: Four times a day (QID) | ORAL | Status: DC | PRN

## 2015-11-27 NOTE — Telephone Encounter (Signed)
I spoke to Manuela Schwartz, pt's sister (per DPR) and advised her that pt's RX for norco is ready for pick up at the front desk. Pt's sister verbalized understanding.

## 2015-11-28 MED ORDER — HYDROCODONE-ACETAMINOPHEN 5-325 MG PO TABS: 1.0000 | ORAL_TABLET | Freq: Every evening | ORAL | Status: DC | PRN

## 2015-11-28 NOTE — Progress Notes (Signed)
RX for norco dated 11/28/2015 by Dr. Brett Fairy shredded. RX last written for 11/27/2015. Pt was advised the RX from 11/27/2015 ready for pick up.

## 2015-11-28 NOTE — Addendum Note (Signed)
Addended by: [name] on: 11/28/2015 08:33 AM   Modules accepted: Orders

## 2016-01-20 ENCOUNTER — Other Ambulatory Visit: Payer: Self-pay | Admitting: Neurology

## 2016-01-21 ENCOUNTER — Telehealth: Payer: Self-pay | Admitting: *Deleted

## 2016-01-21 MED ORDER — HYDROCODONE-ACETAMINOPHEN 5-325 MG PO TABS: 1.0000 | ORAL_TABLET | Freq: Every evening | ORAL | 0 refills | Status: DC | PRN

## 2016-01-21 NOTE — Telephone Encounter (Signed)
Sent RX request to [facility].

## 2016-01-21 NOTE — Telephone Encounter (Signed)
Hydrocodone rx. up front [facility] 

## 2016-01-25 ENCOUNTER — Other Ambulatory Visit: Payer: Self-pay | Admitting: Neurology

## 2016-02-23 ENCOUNTER — Emergency Department (HOSPITAL_COMMUNITY)
Admission: EM | Admit: 2016-02-23 | Discharge: 2016-02-23 | Disposition: A | Discharge status: Home / Self Care | Payer: Medicare Other | Attending: Emergency Medicine | Admitting: Emergency Medicine

## 2016-02-23 ENCOUNTER — Encounter (HOSPITAL_COMMUNITY): Payer: Self-pay | Admitting: Emergency Medicine

## 2016-02-23 DIAGNOSIS — M792 Neuralgia and neuritis, unspecified: Secondary | ICD-10-CM

## 2016-02-23 DIAGNOSIS — Z87891 Personal history of nicotine dependence: Secondary | ICD-10-CM | POA: Insufficient documentation

## 2016-02-23 DIAGNOSIS — Z79899 Other long term (current) drug therapy: Secondary | ICD-10-CM | POA: Diagnosis not present

## 2016-02-23 DIAGNOSIS — L03115 Cellulitis of right lower limb: Secondary | ICD-10-CM | POA: Diagnosis not present

## 2016-02-23 DIAGNOSIS — R1030 Lower abdominal pain, unspecified: Secondary | ICD-10-CM | POA: Diagnosis present

## 2016-02-23 LAB — CBC WITH DIFFERENTIAL/PLATELET
BASOS ABS: 0 10*3/uL (ref 0.0–0.1)
BASOS PCT: 0 %
EOS ABS: 0.4 10*3/uL (ref 0.0–0.7)
EOS PCT: 6 %
HEMATOCRIT: 44.9 % (ref 39.0–52.0)
Hemoglobin: 14.7 g/dL (ref 13.0–17.0)
Lymphocytes Relative: 22 %
Lymphs Abs: 1.4 10*3/uL (ref 0.7–4.0)
MCH: 30.8 pg (ref 26.0–34.0)
MCHC: 32.7 g/dL (ref 30.0–36.0)
MCV: 93.9 fL (ref 78.0–100.0)
MONO ABS: 0.6 10*3/uL (ref 0.1–1.0)
MONOS PCT: 10 %
Neutro Abs: 3.7 10*3/uL (ref 1.7–7.7)
Neutrophils Relative %: 62 %
PLATELETS: 171 10*3/uL (ref 150–400)
RBC: 4.78 MIL/uL (ref 4.22–5.81)
RDW: 14 % (ref 11.5–15.5)
WBC: 6.1 10*3/uL (ref 4.0–10.5)

## 2016-02-23 LAB — URINALYSIS, ROUTINE W REFLEX MICROSCOPIC
BILIRUBIN URINE: NEGATIVE
Glucose, UA: NEGATIVE mg/dL
KETONES UR: NEGATIVE mg/dL
Leukocytes, UA: NEGATIVE
NITRITE: NEGATIVE
PH: 5 (ref 5.0–8.0)
PROTEIN: NEGATIVE mg/dL
Specific Gravity, Urine: 1.022 (ref 1.005–1.030)

## 2016-02-23 LAB — BASIC METABOLIC PANEL
Anion gap: 8 (ref 5–15)
BUN: 11 mg/dL (ref 6–20)
CALCIUM: 8.7 mg/dL — AB (ref 8.9–10.3)
CO2: 27 mmol/L (ref 22–32)
CREATININE: 0.67 mg/dL (ref 0.61–1.24)
Chloride: 103 mmol/L (ref 101–111)
Glucose, Bld: 104 mg/dL — ABNORMAL HIGH (ref 65–99)
Potassium: 4.2 mmol/L (ref 3.5–5.1)
SODIUM: 138 mmol/L (ref 135–145)

## 2016-02-23 LAB — URINE MICROSCOPIC-ADD ON
Bacteria, UA: NONE SEEN
Squamous Epithelial / LPF: NONE SEEN
WBC UA: NONE SEEN WBC/hpf (ref 0–5)

## 2016-02-23 MED ORDER — CEPHALEXIN 500 MG PO CAPS
500.0000 mg | ORAL_CAPSULE | Freq: Once | ORAL | Status: AC
  Administered 2016-02-23: 500 mg via ORAL
  Filled 2016-02-23: qty 1

## 2016-02-23 MED ORDER — HYDROMORPHONE HCL 1 MG/ML IJ SOLN
1.0000 mg | Freq: Once | INTRAMUSCULAR | Status: AC
  Administered 2016-02-23: 1 mg via INTRAVENOUS
  Filled 2016-02-23: qty 1

## 2016-02-23 MED ORDER — SULFAMETHOXAZOLE-TRIMETHOPRIM 800-160 MG PO TABS: 1.0000 | ORAL_TABLET | Freq: Two times a day (BID) | ORAL | 0 refills | Status: AC

## 2016-02-23 MED ORDER — CEPHALEXIN 500 MG PO CAPS: 500.0000 mg | ORAL_CAPSULE | Freq: Four times a day (QID) | ORAL | 0 refills | Status: DC

## 2016-02-23 MED ORDER — OXYCODONE HCL ER 10 MG PO T12A
10.0000 mg | EXTENDED_RELEASE_TABLET | Freq: Two times a day (BID) | ORAL | Status: DC
  Administered 2016-02-23: 10 mg via ORAL
  Filled 2016-02-23: qty 1

## 2016-02-23 MED ORDER — SULFAMETHOXAZOLE-TRIMETHOPRIM 800-160 MG PO TABS
1.0000 | ORAL_TABLET | Freq: Once | ORAL | Status: AC
  Administered 2016-02-23: 1 via ORAL
  Filled 2016-02-23: qty 1

## 2016-02-23 NOTE — ED Provider Notes (Signed)
[facility] DEPT Provider Note   CSN: [id number] Arrival date & time: 02/23/16  [id number] By signing my name below, I, [name], attest that this documentation has been prepared under the direction and in the presence of Varney Biles, MD . Electronically Signed: Dyke [name], Scribe. 02/23/2016. 4:55 AM.   History   Chief Complaint Chief Complaint  Patient presents with  . Groin Pain    bilateral  . Leg Pain    bilateral   HPI [name] is a 65 y.o. male with hx of peripheral neuropathy who presents to the Emergency Department complaining of acute on chronic groin pain onset yesterday afternoon. Pt is currently taking gabapentin,  Duloxetine, Lyrica, and hydrocodone for pain which has provided no relief. His pain is severe. He describes it as sharp and burning. He also notes some dysuria.  Pt denies any SOB or CP. Pt also has leg swelling and weeping on the R side, has hx of DVT.  The history is provided by the patient and a relative. No language interpreter was used.   Past Medical History:  Diagnosis Date  . Arthritis   . Atrial fibrillation ([place])    a. Dx 05/2014 in setting of seizure, PNA. S/p TEE/DCCV.  Marland Kitchen Bowel perforation ([place])    a. transvere and R colectomy and ileostomy in 12/25/13.  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 Deep vein thrombosis of right lower extremity ([place])   . Depression   . DVT (deep venous thrombosis) ([facility])    multiple times  . Hyperlipidemia   . Hyperplasia of prostate   . Obesity   . Peripheral neuropathy ([place])   . PFO (patent foramen ovale)    a. 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  . Psoriasis   . Seizures ([place])   . Venous stasis   . Weakness of both legs     Patient Active Problem List   Diagnosis Date Noted  . 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 Absence epileptic syndrome, not intractable, with status epilepticus ([place]) 07/05/2014    . Neuropathy involving both lower extremities 07/05/2014  . PFO (patent foramen ovale)   . Atrial fibrillation ([place])   . Obesity   . Chronic anticoagulation 05/23/2014  . Atrial fibrillation with rapid ventricular response ([place]) 05/22/2014  . Seizures ([place]) 05/20/2014  . Acute respiratory failure with hypoxia ([place]) 05/20/2014  . Sepsis ([place]) 01/31/2014  . UTI (lower urinary tract infection) 01/30/2014  . Fever 01/30/2014  . Hyperglycemia 01/30/2014  . Free intraperitoneal air 12/25/2013  . Perforated viscus 12/25/2013  . Vitamin B 12 deficiency 12/18/2013  . Colon abnormality 12/18/2013  . Peripheral neuropathy ([place]) 09/05/2013  . Hyperlipidemia   . Bimalleolar ankle fracture 09/01/2013  . History of DVT (deep vein thrombosis) 09/01/2013  . Ankle fracture 09/01/2013  . Fracture of ankle, closed 09/01/2013  . Internal hemorrhoids 10/06/2012  . External hemorrhoids 10/06/2012  . Anal skin tag 10/06/2012   Past Surgical History:  Procedure Laterality Date  . ANKLE FRACTURE SURGERY  09/04/13  . CARDIOVERSION N/A 05/24/2014   Procedure: CARDIOVERSION;  Surgeon: Josue Hector, MD;  Location: [facility] ENDOSCOPY;  Service: Cardiovascular;  Laterality: N/A;  . COLONOSCOPY  03/26/2012   Procedure: COLONOSCOPY;  Surgeon: Beryle Beams, MD;  Location: [facility] ENDOSCOPY;  Service: Endoscopy;  Laterality: N/A;  . COLOSTOMY  12-25-13   Ruptured colon  . FLEXIBLE SIGMOIDOSCOPY N/A 12/09/2013  Procedure: FLEXIBLE SIGMOIDOSCOPY;  Surgeon: Beryle Beams, MD;  Location: [facility] ENDOSCOPY;  Service: Endoscopy;  Laterality: N/A;  . LAPAROTOMY N/A 12/25/2013   Procedure: EXPLORATORY LAPAROTOMY/Transverse and right coloectomy/ end  ileostomy and mucus  fistula;  Surgeon: Ralene Ok, MD;  Location: [facility];  Service: General;  Laterality: N/A;  . TEE WITHOUT CARDIOVERSION N/A 05/24/2014   Procedure: TRANSESOPHAGEAL ECHOCARDIOGRAM (TEE);  Surgeon: Josue Hector, MD;  Location: [facility] ENDOSCOPY;  Service:  Cardiovascular;  Laterality: N/A;  . TONSILLECTOMY     as child    Home Medications    Prior to Admission medications   Medication Sig Start Date End Date Taking? Authorizing Provider  acetaminophen (TYLENOL) 500 MG tablet Take 1 tablet (500 mg total) by mouth 2 (two) times daily as needed for mild pain or fever. 02/02/14   Modena Jansky, MD  Calcium Carb-Cholecalciferol (CALCIUM-VITAMIN D3) 600-500 MG-UNIT CAPS Take 1 capsule by mouth daily.     Historical Provider, MD  cephALEXin (KEFLEX) 500 MG capsule Take 1 capsule (500 mg total) by mouth 4 (four) times daily. 02/23/16   Varney Biles, MD  diltiazem (CARDIZEM CD) 180 MG 24 hr capsule TAKE 1 CAPSULE (180 MG TOTAL) BY MOUTH DAILY. 06/20/15   Pixie Casino, MD  doxepin (SINEQUAN) 75 MG capsule Take 150 capsules by mouth at bedtime.  10/16/14   Historical Provider, MD  DULoxetine (CYMBALTA) 30 MG capsule Take 30 mg by mouth daily. Pt takes a total of 90 mg    Historical Provider, MD  DULoxetine (CYMBALTA) 60 MG capsule Take 60 mg by mouth daily. Pt takes a total of 90mg     Historical Provider, MD  gabapentin (NEURONTIN) 600 MG tablet Take 1 tablet (600 mg total) by mouth 3 (three) times daily. Take one at lunch and 2 tabs at dinner time. 10/04/15   Asencion Partridge Dohmeier, MD  HYDROcodone-acetaminophen (NORCO/VICODIN) 5-325 MG tablet Take 1 tablet by mouth at bedtime as needed for moderate pain. 01/21/16   Britt Bottom, MD  levETIRAcetam (KEPPRA) 750 MG tablet TAKE 2 TABLETS (1,500 MG TOTAL) BY MOUTH 3 (THREE) TIMES DAILY. FOR SEIZURES 01/28/16   Asencion Partridge Dohmeier, MD  LORazepam (ATIVAN) 1 MG tablet Use 1 mg tab to break focal seizure activity. As needed, do not exceed 2 mg a day. 10/03/14   Asencion Partridge Dohmeier, MD  LYRICA 25 MG capsule Take 25 mg by mouth daily. Take with 50 mg to mke 75 mg 10/01/15   Historical Provider, MD  LYRICA 50 MG capsule Take 50 mg by mouth. Take with 25 mg to make 75mg  10/01/15   Historical Provider, MD  Multiple Vitamin  (MULTIVITAMIN WITH MINERALS) TABS Take 1 tablet by mouth daily.    Historical Provider, MD  phenytoin (DILANTIN) 100 MG ER capsule TAKE 4 CAPSULES BY MOUTH AT BEDTIME 06/29/15   Carmen Dohmeier, MD  PRADAXA 150 MG CAPS capsule TAKE 1 CAPSULE (150 MG TOTAL) BY MOUTH 2 (TWO) TIMES DAILY. 06/21/15   Pixie Casino, MD  sulfamethoxazole-trimethoprim (BACTRIM DS,SEPTRA DS) 800-160 MG tablet Take 1 tablet by mouth 2 (two) times daily. 02/23/16 03/01/16  Varney Biles, MD  vitamin B-12 (CYANOCOBALAMIN) 1000 MCG tablet Take 1,000 mcg by mouth daily. For supplement    Historical Provider, MD    Family History Family History  Problem Relation Age of Onset  . Vision loss Father   . Hypertension Father   . Diabetes Father   . Heart disease Father   . Cancer Father  colon  . Diabetes Mother   . Hypertension Mother   . Heart disease Mother   . Dementia Mother   . Cancer Mother     breast ca  . Diabetes Sister   . Hyperlipidemia Sister   . Hypertension Sister   . Diabetes Brother   . Kidney disease Brother   . Heart disease Brother    Social History Social History  Substance Use Topics  . Smoking status: Former Smoker    Types: Pipe    Quit date: 08/10/2013  . Smokeless tobacco: Never Used  . Alcohol use No   Allergies   Penicillins  Review of Systems Review of Systems 10 systems reviewed and all are negative for acute change except as noted in the HPI.   Physical Exam Updated Vital Signs BP 135/79 (BP Location: Left Arm)   Pulse 65   Temp 97.8 F (36.6 C) (Oral)   Resp 20   SpO2 92%   Physical Exam  Constitutional: He is oriented to person, place, and time. He appears well-developed and well-nourished.  HENT:  Head: Normocephalic.  Eyes: EOM are normal.  Neck: Normal range of motion.  Pulmonary/Chest: Effort normal.  Abdominal: He exhibits no distension.  Genitourinary:  Genitourinary Comments: Testes are free moving bilaterally, but TTP  Musculoskeletal: Normal  range of motion.  RLE-- there is erythema and edema with some blisters that are weeping. Mild warm to touch.  Neurological: He is alert and oriented to person, place, and time.  Psychiatric: He has a normal mood and affect.  Nursing note and vitals reviewed.          ED Treatments / Results  DIAGNOSTIC STUDIES:  Oxygen Saturation is 94% on RA, adequate by my interpretation.    COORDINATION OF CARE:  4:50 AM Discussed treatment plan with pt at bedside and pt agreed to plan.  Labs (all labs ordered are listed, but only abnormal results are displayed) Labs Reviewed  BASIC METABOLIC PANEL - Abnormal; Notable for the following:       Result Value   Glucose, Bld 104 (*)    Calcium 8.7 (*)    All other components within normal limits  URINALYSIS, ROUTINE W REFLEX MICROSCOPIC (NOT AT [facility]) - Abnormal; Notable for the following:    APPearance CLOUDY (*)    Hgb urine dipstick LARGE (*)    All other components within normal limits  URINE CULTURE  CBC WITH DIFFERENTIAL/PLATELET  URINE MICROSCOPIC-ADD ON    EKG  EKG Interpretation None       Radiology No results found.  Procedures Procedures (including critical care time)  Medications Ordered in ED Medications  oxyCODONE (OXYCONTIN) 12 hr tablet 10 mg (not administered)  HYDROmorphone (DILAUDID) injection 1 mg (1 mg Intravenous Given 02/23/16 0614)  cephALEXin (KEFLEX) capsule 500 mg (500 mg Oral Given 02/23/16 0712)  sulfamethoxazole-trimethoprim (BACTRIM DS,SEPTRA DS) 800-160 MG per tablet 1 tablet (1 tablet Oral Given 02/23/16 [id number])     Initial Impression / Assessment and Plan / ED Course  I have reviewed the triage vital signs and the nursing notes.  Pertinent labs & imaging results that were available during my care of the patient were reviewed by me and considered in my medical decision making (see chart for details).  Clinical Course  Comment By Time  Labs are reassuring. Results from the ER workup  discussed with the patient face to face and all questions answered to the best of my ability. Strict ER return precautions have been  discussed, and patient is agreeing with the plan and is comfortable with the workup done and the recommendations from the ER.  Varney Biles, MD 10/14 [phone]   I personally performed the services described in this documentation, which was scribed in my presence. The recorded information has been reviewed and is accurate.   Pt has groin pain. Pain is chronic and described as neuropahty type pain that is now flairing up. Scrotal exam tender testes bilaterally, but no edema, no torsion. We dont think pt has fournier's gangrene either. Appears to be neuropathic pain flair up.   Pt also having leg pain and swelling on the R side. New redness, swelling out there, and it is warm to touch. Will give keflex and bactrim.   Final Clinical Impressions(s) / ED Diagnoses   Final diagnoses:  Cellulitis of right lower extremity  Neuropathic pain   New Prescriptions New Prescriptions   CEPHALEXIN (KEFLEX) 500 MG CAPSULE    Take 1 capsule (500 mg total) by mouth 4 (four) times daily.   SULFAMETHOXAZOLE-TRIMETHOPRIM (BACTRIM DS,SEPTRA DS) 800-160 MG TABLET    Take 1 tablet by mouth 2 (two) times daily.     Varney Biles, MD 02/23/16 [phone]

## 2016-02-23 NOTE — ED Triage Notes (Signed)
Per EMS , pt. From home with complaint of bil. Groin pain;chronic at 10/10,also complaint of bil leg pain; chronic. Denied SOb, denied chest pain, A/O x4.

## 2016-02-23 NOTE — ED Notes (Signed)
[facility] called for transport home. 

## 2016-02-23 NOTE — ED Notes (Signed)
Bed:  Hospital Expected date:  Expected time:  Means of arrival:  Comments: EMS 65 yo male from home/groin and leg pain-hx neuropathy-swelling

## 2016-02-23 NOTE — Discharge Instructions (Signed)
Take the meds prescribed. If getting worse - return to the ER.

## 2016-02-24 LAB — URINE CULTURE

## 2016-03-17 ENCOUNTER — Other Ambulatory Visit: Payer: Self-pay | Admitting: Neurology

## 2016-03-18 MED ORDER — HYDROCODONE-ACETAMINOPHEN 5-325 MG PO TABS: 1.0000 | ORAL_TABLET | Freq: Every evening | ORAL | 0 refills | Status: DC | PRN

## 2016-03-19 ENCOUNTER — Other Ambulatory Visit: Payer: Self-pay | Admitting: Neurology

## 2016-04-15 ENCOUNTER — Telehealth: Payer: Self-pay | Admitting: Pharmacist Clinician (PhC)/ Clinical Pharmacy Specialist

## 2016-04-15 NOTE — Telephone Encounter (Signed)
Patient on Dilantin and pradaxa.  Contra-indicated due to PGP (can decrease concentration of pradaxa.  LMOM for patient that we would recommend conversion to warfarin for any patient on Dilantin.

## 2016-04-15 NOTE — Telephone Encounter (Signed)
Spoke with sister on phone.  Patient has been on Pradaxa and Dilantin for about 2 years per her recollection.  No problems in that time relating to stroke or PE/DVT.  He is bed-bound and only able to get to OV by ambulance, so not feasible to be on warfarin.     Will review with Dr. Debara Pickett as to whether patient needs to remain on Pradaxa or we could possibly see if patient could qualify for in home INR meter

## 2016-04-16 ENCOUNTER — Encounter: Payer: Self-pay | Admitting: Neurology

## 2016-04-16 NOTE — Telephone Encounter (Signed)
Agree with Dr. Debara Pickett.  Have called [facility] patient services to set up for family to do in home INR monitoring.  Will get forms filled out, signed and faxed back to them ASAP.  Spoke with patient sister, she is his primary caregiver.  She is familiar with daily glucose monitoring, so suspect that she will be easy to train.

## 2016-04-16 NOTE — Telephone Encounter (Signed)
I can change this patient from dilantin to [place] may alone be strong enough in a patient who doesn't walk and is not at high risk of injury CD

## 2016-04-16 NOTE — Telephone Encounter (Signed)
There is literature describing a clinical report of stroke in a patient on dilantin and pradaxa. I think unless there is an alternative to dilantin (he is on keppra as well) to control his seizures, then he needs to be switched to warfarin. Can we arrange monthly home blood draws? I will include her neurologist on this for her recommendations.  Dr. Lemmie Evens

## 2016-04-17 MED ORDER — LAMOTRIGINE 25 MG PO TABS: 25.0000 mg | ORAL_TABLET | Freq: Two times a day (BID) | ORAL | 0 refills | Status: DC

## 2016-04-17 NOTE — Telephone Encounter (Signed)
Will hold off on forms for home INR monitoring.  Let me know if he doesn't tolerate med changes or you need to go back on dilantin for any reason.

## 2016-04-17 NOTE — Telephone Encounter (Signed)
Dr. Brett Fairy can change dilantin over to lamictal which would allow [place] to continue Pradaxa - that may be the best way to go. So far, it seems to be effective for him and not associated with any adverse bleeding that I'm aware of.  Lets go that route. Thanks to both of you.  -Mali

## 2016-04-17 NOTE — Telephone Encounter (Signed)
Beverlee Nims, Please call Mr Nyborg,   Dear Mr. Senerchia,  I have  Prescribed 25 mg Lamictal tab for twice daily use.  After 30 days we will need to increase to 50 mg bid. At that time ( in 30 days) I should be able to take you off Dilantin.  I have made your cardiologist aware of these changes as he correlates this with  the anti-coagulation therapy. We will schedule a RV in 30 days. [name], MD

## 2016-04-17 NOTE — Addendum Note (Signed)
Addended by: [name] on: 04/17/2016 04:41 PM   Modules accepted: Orders

## 2016-04-18 NOTE — Telephone Encounter (Signed)
Yes, for 30 days we keep parallel Lamictal and Dilantin, than we stop after you advanced to 100 mg Lamictal, divided in 50 mg bid.

## 2016-04-21 NOTE — Telephone Encounter (Signed)
LM for patient to call back.

## 2016-04-28 ENCOUNTER — Other Ambulatory Visit: Payer: Self-pay | Admitting: Internal Medicine

## 2016-05-06 ENCOUNTER — Ambulatory Visit: Payer: Medicare Other | Admitting: Adult Health

## 2016-05-16 ENCOUNTER — Other Ambulatory Visit: Payer: Self-pay | Admitting: Neurology

## 2016-05-20 ENCOUNTER — Ambulatory Visit (INDEPENDENT_AMBULATORY_CARE_PROVIDER_SITE_OTHER): Payer: Medicare Other | Admitting: Adult Health

## 2016-05-20 ENCOUNTER — Encounter: Payer: Self-pay | Admitting: Adult Health

## 2016-05-20 VITALS — BP 156/82 | HR 76 | Ht 77.0 in

## 2016-05-20 DIAGNOSIS — G629 Polyneuropathy, unspecified: Secondary | ICD-10-CM | POA: Diagnosis not present

## 2016-05-20 DIAGNOSIS — R569 Unspecified convulsions: Secondary | ICD-10-CM | POA: Diagnosis not present

## 2016-05-20 DIAGNOSIS — Z5181 Encounter for therapeutic drug level monitoring: Secondary | ICD-10-CM | POA: Diagnosis not present

## 2016-05-20 MED ORDER — HYDROCODONE-ACETAMINOPHEN 5-325 MG PO TABS: 1.0000 | ORAL_TABLET | Freq: Every evening | ORAL | 0 refills | Status: DC | PRN

## 2016-05-20 NOTE — Progress Notes (Signed)
I agree with the assessment and plan as directed by NP .The patient is known to me .   Pratt Bress, MD  

## 2016-05-20 NOTE — Patient Instructions (Signed)
Increase lamictal to 3 tablets twice a day in 2 weeks then we will consider decreasing Dilantin Consider either using lyrica or gabapentin If your symptoms worsen or you develop new symptoms please let us know.

## 2016-05-20 NOTE — Progress Notes (Signed)
PATIENT: [name] DOB: 1951/02/05  REASON FOR VISIT: follow up HISTORY FROM: patient  HISTORY OF PRESENT ILLNESS: Today 05/30/2016: Mr. Kulbacki is a 66 year old male with a history of seizures and neuropathy. He returns today for follow-up. He is currently on Dilantin 400 mg at bedtime, Lamictal and Keppra. The plan is to switch the patient over to Lamictal and discontinue Dilantin as he is now on pradaxa. Patient was recently increased on Lamictal to 50 mg twice a day. He is tolerating this medication well. His sister reports that he has 3 staring episodes since his last visit in May. She states that the events only last for several seconds. The patient is slightly confused afterwards but returns to his baseline fairly quickly. The patient continues to have pain with his neuropathy. He is on gabapentin and it appears he is on Lyrica as well. His sister states that his Lyrica was prescribed by the primary care provider. She reports neither 1 is overly beneficial for his pain. She states that he has a lot of discomfort in the groin area. She is asking if a nerve ablation is possible in this area. The patient also uses hydrocodone for severe pain. His last refill was in November. He returns today for an evaluation.   HISTORY 10/03/14 [facility]): CASIMIR GRISSETT is a 66 y.o. male, presenting on a stretcher, seen here as a referral from Dr. Ashby Dawes for new evaluation of Epilepsy, Neuropathy .   This patient has followed with Dr Erling Cruz prior to his retirement and was last seen 02-11-2011.  Dr.Love described Mr. 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 His head would turn to the left repeatedly , he did not lose awareness. The patient was placed on Dilantin brand name under milligrams 3 times a day alternating with 100 mg 4 times a day every other day. He was also on Keppra in the generic form of  levetiracetam 1500 mg 3 times a day. He had been tried on topiramate but it caused him to have a stomach upset. Since the patient has severe neuropathy and chronic venostasis he has also been unsteady on his feet and a high fall risk was assessed.  He has psoriasis and easily bruises he fell in his home and broke his left ankle in a fall in February 2012 . he now fell , stumbling over his walker and broke his right ankle April 2015, he spent a month in rehabilitation, developed a megacolon, he has a urinary catheter - Foley infected -he had to be readmitted with urosepsis, he has a colostomy bag which also has let to skin lacerations while in a facility not adequately cared for.  His sister seems to be the one to change and since she Provides skin care he has no repeated infections, the folwy has been replaced. The last lab level was obtained through Dr. Erling Cruz on 11-19-2010 with a normal CBC complex metabolic panel and a phenytoin level of 6. He also ordered a DEXA scan last 2008 and that was normal. A normal bone density test from March 2013 was normal -according to Dr. Mathis Fare note . He has progressive numbness in his legs and feet but weaning him off Dilantin was not successful and increased his seizure frequency. It is likely that his neuropathy is partially caused by Dilantin.  Interval history on 07-05-14. Mr. Feuerborn was admitted in status epilepticus on 05/17/2014 to the [facility] and followed by Dr. Cruzita Lederer,  He underwent multiple multiple brain scans which did not show an intracranial abnormality it was also noted that he was unable to swallow any medications during his status and therefore received Dilantin and Keppra intravenously. He was intubated for a couple of days. The status epilepticus was broken by being given IV medications. He is back on Dilantin and I have increased that in his last visit to 400 mg at night instead of alternating 300 with 400 we will resume this  today. I refill his Keppra today as well as his Neurontin which I further increased by 600 mg. Neurontin also an antiepileptic medication is used in his case to treat his significant sensory abnormalities related to a neuropathy. What is remarkable to me is that the patient has almost no control over his lower extremities and lost all muscle tone by his upper extremities provide good grip strength and normal movement and tone. A nerve conduction study and EMG with Dr. Jannifer Franklin just documented neuropathy. The admission to the hospital was precipitated by developing pneumonia. The CT scan of the head was reviewed and the report is copied to today's note. I have discussed with the patient today that I would like for him to undergo a thoracic spine MRI if possible his last brain MRI by my records is from 2004, ordered by Dr. Jannifer Franklin at the time. In addition I do not need a brain MRI at this point I think that it is clear that the pneumonia cost him to have the seizures which is not an unknown or uncommon trigger. Any infection below the seizure threshold. What I would like to research further why his lower extremities are disproportionately affected by a neuropathy.  Interval history from 10-03-14. She no longer has physical therapy provided through [facility] payment, he and his sister are using some weight exercises at home. He has also healed from this macerated skin near the colostomy,. A honey-based product has been helpful in establishing better nor skin. Dr. Cornelious Bryant thousand patient and exchanged his nocturnal time dose of gabapentin with doxepin. This helped him to sleep. Does not help the pain. He also has a rectal pain pulled endorsed neuropathy. I would like for Dr. Cornelious Bryant to have another visit with the patient also wondered if she couldperform any kind of study on the pudendal nerve?.CD  Interval history from 10/02/2015, Mr. Aderholt has not had any significant seizure activity over the last 12 months at  least. His main problem is that due to his immobility and venostasis he has painful neuropathy, venostasis and leg ulcers.  REVIEW OF SYSTEMS: Out of a complete 14 system review of symptoms, the patient complains only of the following symptoms, and all other reviewed systems are negative.  Leg swelling, snoring, seizures, testicular pain, wounds  ALLERGIES: Allergies  Allergen Reactions  . Penicillins     Mother and brother have had severe allergic reactions, no personal reaction    HOME MEDICATIONS: Outpatient Medications Prior to Visit  Medication Sig Dispense Refill  . acetaminophen (TYLENOL) 500 MG tablet Take 1 tablet (500 mg total) by mouth 2 (two) times daily as needed for mild pain or fever. 30 tablet 0  . Calcium Carb-Cholecalciferol (CALCIUM-VITAMIN D3) 600-500 MG-UNIT CAPS Take 1 capsule by mouth daily.     . cephALEXin (KEFLEX) 500 MG capsule Take 1 capsule (500 mg total) by mouth 4 (four) times daily. 28 capsule 0  . diltiazem (CARDIZEM CD) 180 MG 24 hr capsule TAKE  ONE CAPSULE BY MOUTH EVERY DAY 30 capsule 10  . doxepin (SINEQUAN) 75 MG capsule Take 150 capsules by mouth at bedtime.   5  . DULoxetine (CYMBALTA) 30 MG capsule Take 30 mg by mouth daily. Pt takes a total of 90 mg    . DULoxetine (CYMBALTA) 60 MG capsule Take 60 mg by mouth daily. Pt takes a total of 90mg     . gabapentin (NEURONTIN) 600 MG tablet Take 1 tablet (600 mg total) by mouth 3 (three) times daily. Take one at lunch and 2 tabs at dinner time. 90 tablet 5  . gabapentin (NEURONTIN) 600 MG tablet TAKE ONE TABLET BY MOUTH AT LUNCH AND 2 TABLETS BY MOUTH AT [facility] TIME. 90 tablet 5  . HYDROcodone-acetaminophen (NORCO/VICODIN) 5-325 MG tablet Take 1 tablet by mouth at bedtime as needed for moderate pain. 30 tablet 0  . lamoTRIgine (LAMICTAL) 25 MG tablet Take 2 tablets (50 mg total) by mouth 2 (two) times daily. 60 tablet 0  . levETIRAcetam (KEPPRA) 750 MG tablet TAKE 2 TABLETS (1,500 MG TOTAL) BY MOUTH 3  (THREE) TIMES DAILY. FOR SEIZURES 540 tablet 1  . LORazepam (ATIVAN) 1 MG tablet Use 1 mg tab to break focal seizure activity. As needed, do not exceed 2 mg a day. 30 tablet 0  . LYRICA 25 MG capsule Take 25 mg by mouth daily. Take with 50 mg to mke 75 mg    . LYRICA 50 MG capsule Take 50 mg by mouth. Take with 25 mg to make 75mg     . Multiple Vitamin (MULTIVITAMIN WITH MINERALS) TABS Take 1 tablet by mouth daily.    . phenytoin (DILANTIN) 100 MG ER capsule TAKE 4 CAPSULES BY MOUTH AT BEDTIME 360 capsule 3  . PRADAXA 150 MG CAPS capsule TAKE 1 CAPSULE (150 MG TOTAL) BY MOUTH 2 (TWO) TIMES DAILY. 60 capsule 11  . vitamin B-12 (CYANOCOBALAMIN) 1000 MCG tablet Take 1,000 mcg by mouth daily. For supplement     No facility-administered medications prior to visit.     PAST MEDICAL HISTORY: Past Medical History:  Diagnosis Date  . Arthritis   . Atrial fibrillation ([place])    a. Dx 05/2014 in setting of seizure, PNA. S/p TEE/DCCV.  Marland Kitchen Bowel perforation ([place])    a. transvere and R colectomy and ileostomy in 12/25/13.  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 Deep vein thrombosis of right lower extremity ([place])   . Depression   . DVT (deep venous thrombosis) ([facility])    multiple times  . Hyperlipidemia   . Hyperplasia of prostate   . Obesity   . Peripheral neuropathy ([place])   . PFO (patent foramen ovale)    a. 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  . Psoriasis   . Seizures ([place])   . Venous stasis   . Weakness of both legs     PAST SURGICAL HISTORY: Past Surgical History:  Procedure Laterality Date  . ANKLE FRACTURE SURGERY  09/04/13  . CARDIOVERSION N/A 05/24/2014   Procedure: CARDIOVERSION;  Surgeon: Josue Hector, MD;  Location: [facility] ENDOSCOPY;  Service: Cardiovascular;  Laterality: N/A;  . COLONOSCOPY  03/26/2012   Procedure: COLONOSCOPY;  Surgeon: Beryle Beams, MD;  Location: [facility] ENDOSCOPY;  Service: Endoscopy;  Laterality: N/A;  .  COLOSTOMY  12-25-13   Ruptured colon  . FLEXIBLE SIGMOIDOSCOPY N/A 12/09/2013   Procedure: FLEXIBLE SIGMOIDOSCOPY;  Surgeon: Beryle Beams, MD;  Location: [facility]  ENDOSCOPY;  Service: Endoscopy;  Laterality: N/A;  . LAPAROTOMY N/A 12/25/2013   Procedure: EXPLORATORY LAPAROTOMY/Transverse and right coloectomy/ end  ileostomy and mucus  fistula;  Surgeon: Ralene Ok, MD;  Location: [place];  Service: General;  Laterality: N/A;  . TEE WITHOUT CARDIOVERSION N/A 05/24/2014   Procedure: TRANSESOPHAGEAL ECHOCARDIOGRAM (TEE);  Surgeon: Josue Hector, MD;  Location: [facility] ENDOSCOPY;  Service: Cardiovascular;  Laterality: N/A;  . TONSILLECTOMY     as child    FAMILY HISTORY: Family History  Problem Relation Age of Onset  . Vision loss Father   . Hypertension Father   . Diabetes Father   . Heart disease Father   . Cancer Father     colon  . Diabetes Mother   . Hypertension Mother   . Heart disease Mother   . Dementia Mother   . Cancer Mother     breast ca  . Diabetes Sister   . Hyperlipidemia Sister   . Hypertension Sister   . Diabetes Brother   . Kidney disease Brother   . Heart disease Brother     SOCIAL HISTORY: Social History   Social History  . Marital status: Single    Spouse name: N/A  . Number of children: 0  . Years of education: HS   Occupational History  . Not on file.   Social History Main Topics  . Smoking status: Former Smoker    Types: Pipe    Quit date: 08/10/2013  . Smokeless tobacco: Never Used  . Alcohol use No  . Drug use: No  . Sexual activity: Not on file   Other Topics Concern  . Not on file   Social History Narrative   Patient is single and lives with his sister.   Patient is retired from [employer].   Patient has a high school education.   Patient drinks one cup of caffeine daily.   Patient is right-handed.            PHYSICAL EXAM  Vitals:   05/20/16 0906  BP: (!) 156/82  Pulse: 76  Height: 6\' 5"  (1.956 m)   There is no height or weight  on file to calculate BMI.  Generalized: Well developed, in no acute distress,Obese   Neurological examination  Mentation: Alert oriented to time, place, history taking. Follows all commands speech and language fluent Cranial nerve II-XII:  Extraocular movements were full, visual field were full on confrontational test. Facial sensation and strength were normal. Uvula tongue midline. Head turning and shoulder shrug  were normal and symmetric. Motor: The motor testing reveals 5 over 5 strength in the upper extremities. 1/5 in the lower extremities.  Sensory: Sensory testing is intact to soft touch on all 4 extremities. No evidence of extinction is noted.  Coordination: Cerebellar testing reveals good finger-nose-finger bilaterally. Unable to complete heel-to-shin bilaterally Gait and station: Patient is on a stretcher.   DIAGNOSTIC DATA (LABS, IMAGING, TESTING) - I reviewed patient records, labs, notes, testing and imaging myself where available.  Lab Results  Component Value Date   WBC 6.1 02/23/2016   HGB 14.7 02/23/2016   HCT 44.9 02/23/2016   MCV 93.9 02/23/2016   PLT 171 02/23/2016      Component Value Date/Time   NA 138 02/23/2016 0615   NA 140 04/04/2015 1048   K 4.2 02/23/2016 0615   CL 103 02/23/2016 0615   CO2 27 02/23/2016 0615   GLUCOSE 104 (H) 02/23/2016 0615   BUN 11 02/23/2016  0615   BUN 14 04/04/2015 1048   CREATININE 0.67 02/23/2016 0615   CALCIUM 8.7 (L) 02/23/2016 0615   PROT 6.3 04/04/2015 1048   ALBUMIN 3.7 04/04/2015 1048   AST 25 04/04/2015 1048   ALT 43 04/04/2015 1048   ALKPHOS 158 (H) 04/04/2015 1048   BILITOT <0.2 04/04/2015 1048   GFRNONAA >60 02/23/2016 0615   GFRAA >60 02/23/2016 0615   Lab Results  Component Value Date   CHOL 136 10/17/2013   HDL 45 10/17/2013   LDLCALC 78 10/17/2013   TRIG 65 10/17/2013        ASSESSMENT AND PLAN 66 y.o. year old male  has a past medical history of Arthritis; Atrial fibrillation ([place]); Bowel  perforation ([place]); Deep vein thrombophlebitis of left leg ([place]); Deep vein thrombosis of right lower extremity ([place]); Depression; DVT (deep venous thrombosis) ([place]); Hyperlipidemia; Hyperplasia of prostate; Obesity; Peripheral neuropathy ([facility]); PFO (patent foramen ovale); Psoriasis; Seizures ([place]); Venous stasis; and Weakness of both legs. here with:  1. Seizures 2. Neuropathy  In 2 weeks patient will increase his Lamictal to 75 mg twice a day.  In 1 month I will call the patient and at that time we will begin weaning off of Dilantin. I will check blood work today. Patient is currently on gabapentin for neuropathy. He was prescribed Lyrica by his primary care also for neuropathy.I advised that we typically do not recommend that he take both. I will refill hydrocodone today. I did check the [facility] drug registry. The patient is not taking any other controlled substances other than Lyrica. He will return in 6 months or sooner if needed.    Ward Givens, MSN, NP-C 05/20/2016, 9:14 AM [facility] [address] [place],  [address] [phone]

## 2016-05-21 ENCOUNTER — Telehealth: Payer: Self-pay | Admitting: *Deleted

## 2016-05-21 LAB — COMPREHENSIVE METABOLIC PANEL
ALBUMIN: 3.3 g/dL — AB (ref 3.6–4.8)
ALK PHOS: 116 IU/L (ref 39–117)
ALT: 30 IU/L (ref 0–44)
AST: 22 IU/L (ref 0–40)
Albumin/Globulin Ratio: 1.1 — ABNORMAL LOW (ref 1.2–2.2)
BUN / CREAT RATIO: 20 (ref 10–24)
BUN: 12 mg/dL (ref 8–27)
Bilirubin Total: <0.2 mg/dL (ref 0.0–1.2)
CO2: 26 mmol/L (ref 18–29)
CREATININE: 0.61 mg/dL — AB (ref 0.76–1.27)
Calcium: 8.6 mg/dL (ref 8.6–10.2)
Chloride: 103 mmol/L (ref 96–106)
GFR, EST AFRICAN AMERICAN: 123 mL/min/{1.73_m2} (ref >59)
GFR, EST NON AFRICAN AMERICAN: 106 mL/min/{1.73_m2} (ref >59)
GLOBULIN, TOTAL: 3.1 g/dL (ref 1.5–4.5)
Glucose: 161 mg/dL — ABNORMAL HIGH (ref 65–99)
Potassium: 4.6 mmol/L (ref 3.5–5.2)
SODIUM: 141 mmol/L (ref 134–144)
TOTAL PROTEIN: 6.4 g/dL (ref 6.0–8.5)

## 2016-05-21 LAB — CBC WITH DIFFERENTIAL/PLATELET
BASOS: 0 %
Basophils Absolute: 0 10*3/uL (ref 0.0–0.2)
EOS (ABSOLUTE): 0.4 10*3/uL (ref 0.0–0.4)
EOS: 6 %
HEMATOCRIT: 41.7 % (ref 37.5–51.0)
HEMOGLOBIN: 13.7 g/dL (ref 13.0–17.7)
Immature Grans (Abs): 0 10*3/uL (ref 0.0–0.1)
Immature Granulocytes: 0 %
LYMPHS ABS: 0.9 10*3/uL (ref 0.7–3.1)
Lymphs: 15 %
MCH: 31.4 pg (ref 26.6–33.0)
MCHC: 32.9 g/dL (ref 31.5–35.7)
MCV: 95 fL (ref 79–97)
MONOCYTES: 10 %
MONOS ABS: 0.6 10*3/uL (ref 0.1–0.9)
NEUTROS ABS: 4.2 10*3/uL (ref 1.4–7.0)
Neutrophils: 69 %
Platelets: 190 10*3/uL (ref 150–379)
RBC: 4.37 x10E6/uL (ref 4.14–5.80)
RDW: 14.3 % (ref 12.3–15.4)
WBC: 6.1 10*3/uL (ref 3.4–10.8)

## 2016-05-21 LAB — PHENYTOIN LEVEL, TOTAL: Phenytoin (Dilantin), Serum: 5 ug/mL — ABNORMAL LOW (ref 10.0–20.0)

## 2016-05-21 NOTE — Telephone Encounter (Signed)
-----   Message from Ward Givens, NP sent at 05/21/2016  9:10 AM EST ----- Lab work relatively unremarkable. Please call patient. Please and lab work to PCP

## 2016-05-21 NOTE — Telephone Encounter (Signed)
Spoke to susan, his sister and relayed that his lab work was relatively unremarkable.  I would forward to pts pcp.  She verbalized understanding and would let pt know.  Fax confirmation received Dr. Aura Dials, [phone].

## 2016-05-26 ENCOUNTER — Other Ambulatory Visit: Payer: Self-pay | Admitting: Neurology

## 2016-05-27 ENCOUNTER — Other Ambulatory Visit: Payer: Self-pay | Admitting: Neurology

## 2016-05-27 MED ORDER — LAMOTRIGINE 25 MG PO TABS: 75.0000 mg | ORAL_TABLET | Freq: Two times a day (BID) | ORAL | 0 refills | Status: DC

## 2016-05-27 NOTE — Telephone Encounter (Signed)
Received verbal order from Ward Givens, NP to change pt's lamictal dosing to 75mg  BID using the 25 mg tablets dispense 180 with 0 refills. She asked that the pt call us back in 3 weeks to let us know how he is doing on it.

## 2016-05-28 ENCOUNTER — Other Ambulatory Visit: Payer: Self-pay | Admitting: Internal Medicine

## 2016-06-02 ENCOUNTER — Encounter: Payer: Self-pay | Admitting: Neurology

## 2016-06-09 ENCOUNTER — Telehealth: Payer: Self-pay | Admitting: Neurology

## 2016-06-09 NOTE — Telephone Encounter (Signed)
Apparently, a nerve block cannot be performed with [name] on Predaxa.  I am sorry, but I do not see any interventional treatment options. CD

## 2016-06-09 NOTE — Telephone Encounter (Signed)
I called pt, spoke to pt's sister, Manuela Schwartz.  I advised her that Dr. Brett Fairy wanted to tell pt that a nerve block cannot be performed while pt is on pradaxa and that Dr. Brett Fairy does not see any interventional treatment options. Pt's sister verbalized understanding and will relay this message to the pt.

## 2016-06-09 NOTE — Telephone Encounter (Signed)
-----   Message from Charlett Blake, MD sent at 06/09/2016  3:34 PM EST ----- Pradaxa or other oral anticoagulation would preclude any type of intra-axial nerve block ----- Message ----- From: [name], MD Sent: 06/02/2016   5:19 PM To: Charlett Blake, MD   Dear Mitzi Hansen, my patient Mr. Grizzard suffers from a very painful neuropathy and is anticoagulated. Would you consider him for for an nerve block. He complains of very strong groin pain. I'm not sure what I can offer from the neurologic standpoint.  [employer] [employer],

## 2016-06-10 ENCOUNTER — Other Ambulatory Visit: Payer: Self-pay | Admitting: Neurology

## 2016-06-10 ENCOUNTER — Encounter: Payer: Self-pay | Admitting: Neurology

## 2016-06-11 ENCOUNTER — Other Ambulatory Visit: Payer: Self-pay | Admitting: Adult Health

## 2016-06-11 ENCOUNTER — Other Ambulatory Visit: Payer: Self-pay | Admitting: Neurology

## 2016-06-11 MED ORDER — LAMOTRIGINE 100 MG PO TABS: 100.0000 mg | ORAL_TABLET | Freq: Two times a day (BID) | ORAL | 11 refills | Status: DC

## 2016-06-11 NOTE — Telephone Encounter (Signed)
I will send this request to [facility], NP to review. It appears that pt should start to wean off the dilantin.

## 2016-06-11 NOTE — Progress Notes (Signed)
I increased [name] to 100 mg BID. Slowly Decreasing Dilantin- just decreased to 300 mg. Just an [place]

## 2016-06-26 ENCOUNTER — Other Ambulatory Visit: Payer: Self-pay | Admitting: Adult Health

## 2016-06-26 ENCOUNTER — Other Ambulatory Visit: Payer: Self-pay | Admitting: Internal Medicine

## 2016-07-01 ENCOUNTER — Other Ambulatory Visit: Payer: Self-pay | Admitting: Adult Health

## 2016-07-02 MED ORDER — HYDROCODONE-ACETAMINOPHEN 5-325 MG PO TABS: 1.0000 | ORAL_TABLET | Freq: Every evening | ORAL | 0 refills | Status: DC | PRN

## 2016-07-11 ENCOUNTER — Ambulatory Visit (INDEPENDENT_AMBULATORY_CARE_PROVIDER_SITE_OTHER): Payer: Medicare Other | Admitting: Internal Medicine

## 2016-07-11 ENCOUNTER — Encounter: Payer: Self-pay | Admitting: Internal Medicine

## 2016-07-11 VITALS — BP 162/96 | HR 102 | Ht 77.0 in

## 2016-07-11 DIAGNOSIS — I878 Other specified disorders of veins: Secondary | ICD-10-CM

## 2016-07-11 DIAGNOSIS — I480 Paroxysmal atrial fibrillation: Secondary | ICD-10-CM | POA: Diagnosis not present

## 2016-07-11 DIAGNOSIS — R600 Localized edema: Secondary | ICD-10-CM

## 2016-07-11 NOTE — Patient Instructions (Signed)
You have been referred to [place] physician wants you to follow-up in: ONE YEAR with Dr. Debara Pickett. You will receive a reminder letter in the mail two months in advance. If you don't receive a letter, please call our office to schedule the follow-up appointment.

## 2016-07-11 NOTE — Progress Notes (Signed)
OFFICE NOTE  Chief Complaint:  Leg swelling  Primary Care Physician: Cammy Copa, MD  HPI:  [name] is a 66 y.o. 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 He was apparently excessively sedated and he came obtunded requiring intubation for airway protection. He was also found to have a pneumonia. Earlier this morning he went into atrial fibrillation with rapid ventricular response. He is unaware of this. He has no history of A. fib that he is aware of. His mother did have a history of A. fib however she was very late in age with onset. He's denied any chest pain or worsening shortness of breath. Cardiology is asked to evaluate and recommend management for atrial fibrillation. Of note he is on chronic warfarin therapy for his DVT  [name] returns today for follow-up. He was seen in the office on a stretcher. He has difficulty getting to anticoagulation appointments at his primary care provider and they recommended follow-up in our office. I do not see if he has any contraindications to being on a direct oral anticoagulant. For some time he was on Xarelto, however he had a perforated viscus and ultimately had bleeding associated with that. He's very concerned about being on a medicine that cannot be reversed and is asking if there are any direct oral anticoagulant which could be reversed, as [name] alternative to warfarin.  [name] returns today for follow-up. He denies any chest pain or shortness of breath. He's had no bleeding problems on Pradaxa. The only concern about the medication as is cost. I offered alternatives, but they are hesitant to use those because they like the security of been able to reverse the possible  bleeding effects of the medication. His EKG today shows a sinus rhythm.  07/11/2016  [name] was seen today in follow-up. Unfortunately continues to be bedbound was in a stretcher in the room. He has paroxysmal atrial fibrillation but is in sinus today. He seems to be doing well on Pradaxa and he and his wife is comfortable about that since there is a reversal agent available. 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 He has significant bilateral venous stasis edema with exudate and crusting. He started to develop some woody stasis edema as well. He would significantly benefit from a wound care clinic referral.  PMHx:  Past Medical History:  Diagnosis Date  . Arthritis   . Atrial fibrillation ([place])    a. Dx 05/2014 in setting of seizure, PNA. S/p TEE/DCCV.  Marland Kitchen Bowel perforation ([place])    a. transvere and R colectomy and ileostomy in 12/25/13.  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 Deep vein thrombosis of right lower extremity ([place])   . Depression   . DVT (deep venous thrombosis) ([facility])    multiple times  . Hyperlipidemia   . Hyperplasia of prostate   . Obesity   . Peripheral neuropathy ([place])   . PFO (patent foramen ovale)    a. 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  . Psoriasis   . Seizures ([place])   . Venous stasis   . Weakness of both legs     Past Surgical  History:  Procedure Laterality Date  . ANKLE FRACTURE SURGERY  09/04/13  . CARDIOVERSION N/A 05/24/2014   Procedure: CARDIOVERSION;  Surgeon: Josue Hector, MD;  Location: [facility] ENDOSCOPY;  Service: Cardiovascular;  Laterality: N/A;  . COLONOSCOPY  03/26/2012   Procedure: COLONOSCOPY;  Surgeon: Beryle Beams, MD;  Location: [facility] ENDOSCOPY;  Service: Endoscopy;  Laterality: N/A;  . COLOSTOMY  12-25-13   Ruptured colon  . FLEXIBLE SIGMOIDOSCOPY N/A 12/09/2013   Procedure: FLEXIBLE SIGMOIDOSCOPY;  Surgeon: Beryle Beams, MD;   Location: [facility] ENDOSCOPY;  Service: Endoscopy;  Laterality: N/A;  . LAPAROTOMY N/A 12/25/2013   Procedure: EXPLORATORY LAPAROTOMY/Transverse and right coloectomy/ end  ileostomy and mucus  fistula;  Surgeon: Ralene Ok, MD;  Location: [place];  Service: General;  Laterality: N/A;  . TEE WITHOUT CARDIOVERSION N/A 05/24/2014   Procedure: TRANSESOPHAGEAL ECHOCARDIOGRAM (TEE);  Surgeon: Josue Hector, MD;  Location: [facility] ENDOSCOPY;  Service: Cardiovascular;  Laterality: N/A;  . TONSILLECTOMY     as child    FAMHx:  Family History  Problem Relation Age of Onset  . Vision loss Father   . Hypertension Father   . Diabetes Father   . Heart disease Father   . Cancer Father     colon  . Diabetes Mother   . Hypertension Mother   . Heart disease Mother   . Dementia Mother   . Cancer Mother     breast ca  . Diabetes Sister   . Hyperlipidemia Sister   . Hypertension Sister   . Diabetes Brother   . Kidney disease Brother   . Heart disease Brother     SOCHx:   reports that he quit smoking about 2 years ago. His smoking use included Pipe. He has never used smokeless tobacco. He reports that he does not drink alcohol or use drugs.  ALLERGIES:  Allergies  Allergen Reactions  . Penicillins     Mother and brother have had severe allergic reactions, no personal reaction    ROS: Pertinent items noted in HPI and remainder of comprehensive ROS otherwise negative.  HOME MEDS: Current Outpatient Prescriptions  Medication Sig Dispense Refill  . acetaminophen (TYLENOL) 500 MG tablet Take 1 tablet (500 mg total) by mouth 2 (two) times daily as needed for mild pain or fever. 30 tablet 0  . Calcium Carb-Cholecalciferol (CALCIUM-VITAMIN D3) 600-500 MG-UNIT CAPS Take 1 capsule by mouth daily.     . dabigatran (PRADAXA) 150 MG CAPS capsule Take 1 capsule (150 mg total) by mouth 2 (two) times daily. 60 capsule 0  . diltiazem (CARDIZEM CD) 180 MG 24 hr capsule TAKE ONE CAPSULE BY MOUTH EVERY DAY 30  capsule 10  . doxepin (SINEQUAN) 75 MG capsule Take 150 capsules by mouth at bedtime.   5  . DULoxetine (CYMBALTA) 30 MG capsule Take 30 mg by mouth daily. Pt takes a total of 90 mg    . DULoxetine (CYMBALTA) 60 MG capsule Take 60 mg by mouth daily. Pt takes a total of 90mg     . gabapentin (NEURONTIN) 600 MG tablet TAKE ONE TABLET BY MOUTH AT LUNCH AND 2 TABLETS BY MOUTH AT [facility] TIME. 90 tablet 5  . HYDROcodone-acetaminophen (NORCO/VICODIN) 5-325 MG tablet Take 1 tablet by mouth at bedtime as needed for moderate pain. 30 tablet 0  . lamoTRIgine (LAMICTAL) 100 MG tablet Take 1 tablet (100 mg total) by mouth 2 (two) times daily. 60 tablet 11  . levETIRAcetam (KEPPRA) 750 MG tablet TAKE 2 TABLETS (1,500 MG  TOTAL) BY MOUTH 3 (THREE) TIMES DAILY. FOR SEIZURES 540 tablet 1  . LORazepam (ATIVAN) 1 MG tablet Use 1 mg tab to break focal seizure activity. As needed, do not exceed 2 mg a day. 30 tablet 0  . Multiple Vitamin (MULTIVITAMIN WITH MINERALS) TABS Take 1 tablet by mouth daily.    Marland Kitchen OVER THE COUNTER MEDICATION Psoriasis cream prn    . phenytoin (DILANTIN) 100 MG ER capsule Take 3 capsules (300 mg total) by mouth at bedtime. 270 capsule 3  . pregabalin (LYRICA) 75 MG capsule Take 75 mg by mouth daily.    . vitamin B-12 (CYANOCOBALAMIN) 1000 MCG tablet Take 1,000 mcg by mouth daily. For supplement     No current facility-administered medications for this visit.     LABS/IMAGING: No results found for this or any previous visit (from the past 48 hour(s)). No results found.  WEIGHTS: Wt Readings from Last 3 Encounters:  06/14/15 300 lb (136.1 kg)  04/15/15 300 lb (136.1 kg)  10/24/14 280 lb (127 kg)    VITALS: BP (!) 162/96   Pulse (!) 102   Ht 6\' 5"  (1.956 m)   EXAM: General appearance: alert, no distress and Lying supine on a stretcher Neck: no carotid bruit, no JVD and thyroid not enlarged, symmetric, no tenderness/mass/nodules Lungs: clear to auscultation bilaterally Heart:  regular rate and rhythm, S1, S2 normal, no murmur, click, rub or gallop Abdomen: soft, non-tender; bowel sounds normal; no masses,  no organomegaly Extremities: extremities normal, atraumatic, no cyanosis or edema Pulses: 2+ and symmetric Skin: Skin color, texture, turgor normal. No rashes or lesions Neurologic: Grossly normal Psych: Pleasant  EKG: Sinus tachycardia 102   ASSESSMENT: 1. PAF-maintaining sinus, CHADSVASC score of 3 2. History of DVT on warfarin 3. Nonambulatory 4. Dyslipidemia 5. Severe bilateral lower extremity venous stasis edema-weeping  PLAN: 1.   [name] seems to be doing well without recurrent atrial fibrillation. He is on Pradaxa which they're comfortable on because of the reversal agent being available. Unfortunately has severe bilateral venous stasis edema which is weeping encrusted. This is high risk for superinfection. I recommend a referral to the wound care clinic it was a long for management. Blood pressure heart rate were elevated today however he takes his medications in the evening and may represent not having had medications this morning.  Follow-up annually or sooner as necessary.  Pixie Casino, MD, [facility] Attending Cardiologist [place] [facility] Nicodemus Denk 07/11/2016, 9:48 AM

## 2016-07-20 ENCOUNTER — Other Ambulatory Visit: Payer: Self-pay | Admitting: Neurology

## 2016-07-24 ENCOUNTER — Other Ambulatory Visit: Payer: Self-pay | Admitting: *Deleted

## 2016-07-24 ENCOUNTER — Encounter (HOSPITAL_BASED_OUTPATIENT_CLINIC_OR_DEPARTMENT_OTHER): Payer: Medicare Other | Attending: Internal Medicine

## 2016-07-24 DIAGNOSIS — R600 Localized edema: Secondary | ICD-10-CM

## 2016-07-24 DIAGNOSIS — I878 Other specified disorders of veins: Secondary | ICD-10-CM

## 2016-08-06 ENCOUNTER — Other Ambulatory Visit: Payer: Self-pay | Admitting: Neurology

## 2016-08-06 MED ORDER — HYDROCODONE-ACETAMINOPHEN 5-325 MG PO TABS: 1.0000 | ORAL_TABLET | Freq: Every evening | ORAL | 0 refills | Status: DC | PRN

## 2016-08-06 NOTE — Addendum Note (Signed)
Addended by: Lester White Hall A on: 08/06/2016 04:34 PM   Modules accepted: Orders

## 2016-08-13 ENCOUNTER — Other Ambulatory Visit: Payer: Self-pay | Admitting: Internal Medicine

## 2016-08-14 ENCOUNTER — Other Ambulatory Visit: Payer: Self-pay | Admitting: Internal Medicine

## 2016-08-14 NOTE — Telephone Encounter (Signed)
[name] would like a refill of the following medications:     dabigatran (PRADAXA) 150 MG CAPS capsule Pixie Casino, MD]    Preferred pharmacy: [id number] - [place], [place]

## 2016-08-20 ENCOUNTER — Ambulatory Visit: Payer: Medicare Other | Admitting: Occupational Therapy

## 2016-08-21 ENCOUNTER — Encounter (HOSPITAL_COMMUNITY): Payer: Self-pay | Admitting: *Deleted

## 2016-08-21 ENCOUNTER — Emergency Department (HOSPITAL_COMMUNITY)
Admission: EM | Admit: 2016-08-21 | Discharge: 2016-08-22 | Disposition: A | Discharge status: Home / Self Care | Payer: Medicare Other | Attending: Emergency Medicine | Admitting: Emergency Medicine

## 2016-08-21 DIAGNOSIS — R5381 Other malaise: Secondary | ICD-10-CM | POA: Insufficient documentation

## 2016-08-21 DIAGNOSIS — G629 Polyneuropathy, unspecified: Secondary | ICD-10-CM | POA: Diagnosis not present

## 2016-08-21 DIAGNOSIS — R1031 Right lower quadrant pain: Secondary | ICD-10-CM | POA: Diagnosis not present

## 2016-08-21 DIAGNOSIS — G8929 Other chronic pain: Secondary | ICD-10-CM | POA: Diagnosis present

## 2016-08-21 DIAGNOSIS — R1032 Left lower quadrant pain: Secondary | ICD-10-CM | POA: Diagnosis not present

## 2016-08-21 DIAGNOSIS — Z79899 Other long term (current) drug therapy: Secondary | ICD-10-CM | POA: Insufficient documentation

## 2016-08-21 DIAGNOSIS — Z87891 Personal history of nicotine dependence: Secondary | ICD-10-CM | POA: Diagnosis not present

## 2016-08-21 LAB — CBC
HCT: 42.7 % (ref 39.0–52.0)
Hemoglobin: 13.8 g/dL (ref 13.0–17.0)
MCH: 30.5 pg (ref 26.0–34.0)
MCHC: 32.3 g/dL (ref 30.0–36.0)
MCV: 94.5 fL (ref 78.0–100.0)
Platelets: 186 K/uL (ref 150–400)
RBC: 4.52 MIL/uL (ref 4.22–5.81)
RDW: 14.1 % (ref 11.5–15.5)
WBC: 6.8 K/uL (ref 4.0–10.5)

## 2016-08-21 LAB — COMPREHENSIVE METABOLIC PANEL
ALT: 35 U/L (ref 17–63)
AST: 52 U/L — ABNORMAL HIGH (ref 15–41)
Albumin: 3.4 g/dL — ABNORMAL LOW (ref 3.5–5.0)
Alkaline Phosphatase: 91 U/L (ref 38–126)
Anion gap: 7 (ref 5–15)
BILIRUBIN TOTAL: 1.1 mg/dL (ref 0.3–1.2)
BUN: 11 mg/dL (ref 6–20)
CO2: 26 mmol/L (ref 22–32)
CREATININE: 0.88 mg/dL (ref 0.61–1.24)
Calcium: 8.4 mg/dL — ABNORMAL LOW (ref 8.9–10.3)
Chloride: 104 mmol/L (ref 101–111)
GFR calc Af Amer: >60 mL/min (ref >60)
GLUCOSE: 203 mg/dL — AB (ref 65–99)
POTASSIUM: 5.1 mmol/L (ref 3.5–5.1)
Sodium: 137 mmol/L (ref 135–145)
TOTAL PROTEIN: 7.2 g/dL (ref 6.5–8.1)

## 2016-08-21 LAB — LIPASE, BLOOD: LIPASE: 25 U/L (ref 11–51)

## 2016-08-21 MED ORDER — CAPSAICIN-MENTHOL-METHYL SAL 0.025-1-12 % EX CREA: 1.0000 "application " | TOPICAL_CREAM | Freq: Four times a day (QID) | CUTANEOUS | 0 refills | Status: DC | PRN

## 2016-08-21 MED ORDER — LIDOCAINE 5 % EX PTCH: 1.0000 | MEDICATED_PATCH | Freq: Every day | CUTANEOUS | 0 refills | Status: DC | PRN

## 2016-08-21 MED ORDER — DICLOFENAC SODIUM 1 % TD GEL: 4.0000 g | Freq: Four times a day (QID) | TRANSDERMAL | 0 refills | Status: DC

## 2016-08-21 NOTE — ED Notes (Signed)
Emptied pt colostomy bag.  Sister states that it looks like his usual stool in the bag

## 2016-08-21 NOTE — ED Triage Notes (Signed)
Pt from home, permanently bedridden (per ems) and cared for by his sister.  Pt is obese and has multiple medical problems including neuropathy.  Pt is here for increasing pain no longer controlled with his home med hydrocodone.  Pain has been "spreading" from his lower extremity where he chronically suffers pain into his abdomen. Pain in lower abdomen up to his navel.  No n/v or fever with this.

## 2016-08-21 NOTE — ED Notes (Signed)
Bed: VJ50 Expected date:  Expected time:  Means of arrival:  Comments: 66 yr old abdominal pain

## 2016-08-21 NOTE — ED Provider Notes (Signed)
[facility] DEPT Provider Note   CSN: [id number] Arrival date & time: 08/21/16  2113     History   Chief Complaint Chief Complaint  Patient presents with  . Abdominal Pain    HPI TALHA ISER is a 66 y.o. male.  The history is provided by the patient.  Abdominal Pain   This is a chronic problem. The current episode started more than 1 week ago. The problem occurs constantly. The problem has not changed since onset.Associated with: progressive lower extremity neuropathy and immobility. Pain location: lower abdomen. The quality of the pain is shooting. The pain is moderate. Pertinent negatives include fever, diarrhea and vomiting. Nothing aggravates the symptoms. Nothing relieves the symptoms.    Past Medical History:  Diagnosis Date  . Arthritis   . Atrial fibrillation ([place])    a. Dx 05/2014 in setting of seizure, PNA. S/p TEE/DCCV.  Marland Kitchen Bowel perforation ([place])    a. transvere and R colectomy and ileostomy in 12/25/13.  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 Deep vein thrombosis of right lower extremity ([place])   . Depression   . DVT (deep venous thrombosis) ([facility])    multiple times  . Hyperlipidemia   . Hyperplasia of prostate   . Obesity   . Peripheral neuropathy ([place])   . PFO (patent foramen ovale)    a. 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  . Psoriasis   . Seizures ([place])   . Venous stasis   . Weakness of both legs     Patient Active Problem List   Diagnosis Date Noted  . Venous stasis 07/11/2016  . Lower extremity edema 07/11/2016  . 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 Absence epileptic syndrome, not intractable, with status epilepticus ([place]) 07/05/2014  . Neuropathy involving both lower extremities 07/05/2014  . PFO (patent foramen ovale)   . Atrial fibrillation ([place])   . Obesity   . Chronic anticoagulation 05/23/2014  . Atrial fibrillation with rapid ventricular  response ([place]) 05/22/2014  . Seizures ([place]) 05/20/2014  . Acute respiratory failure with hypoxia ([place]) 05/20/2014  . Sepsis ([place]) 01/31/2014  . UTI (lower urinary tract infection) 01/30/2014  . Fever 01/30/2014  . Hyperglycemia 01/30/2014  . Free intraperitoneal air 12/25/2013  . Perforated viscus 12/25/2013  . Vitamin B 12 deficiency 12/18/2013  . Colon abnormality 12/18/2013  . Peripheral neuropathy ([place]) 09/05/2013  . Hyperlipidemia   . Bimalleolar ankle fracture 09/01/2013  . History of DVT (deep vein thrombosis) 09/01/2013  . Ankle fracture 09/01/2013  . Fracture of ankle, closed 09/01/2013  . Internal hemorrhoids 10/06/2012  . External hemorrhoids 10/06/2012  . Anal skin tag 10/06/2012    Past Surgical History:  Procedure Laterality Date  . ANKLE FRACTURE SURGERY  09/04/13  . CARDIOVERSION N/A 05/24/2014   Procedure: CARDIOVERSION;  Surgeon: Josue Hector, MD;  Location: [facility] ENDOSCOPY;  Service: Cardiovascular;  Laterality: N/A;  . COLONOSCOPY  03/26/2012   Procedure: COLONOSCOPY;  Surgeon: Beryle Beams, MD;  Location: [facility] ENDOSCOPY;  Service: Endoscopy;  Laterality: N/A;  . COLOSTOMY  12-25-13   Ruptured colon  . FLEXIBLE SIGMOIDOSCOPY N/A 12/09/2013   Procedure: FLEXIBLE SIGMOIDOSCOPY;  Surgeon: Beryle Beams, MD;  Location: [facility] ENDOSCOPY;  Service: Endoscopy;  Laterality: N/A;  . LAPAROTOMY N/A 12/25/2013   Procedure: EXPLORATORY LAPAROTOMY/Transverse and right coloectomy/ end  ileostomy and mucus  fistula;  Surgeon: Ralene Ok, MD;  Location: [facility];  Service: General;  Laterality: N/A;  . TEE WITHOUT CARDIOVERSION N/A 05/24/2014   Procedure: TRANSESOPHAGEAL ECHOCARDIOGRAM (TEE);  Surgeon: Josue Hector, MD;  Location: [facility] ENDOSCOPY;  Service: Cardiovascular;  Laterality: N/A;  . TONSILLECTOMY     as child       Home Medications    Prior to Admission medications   Medication Sig Start Date End Date Taking? Authorizing Provider  acetaminophen (TYLENOL) 500 MG  tablet Take 500-1,000 mg by mouth every 6 (six) hours as needed for mild pain, moderate pain, fever or headache.   Yes Historical Provider, MD  ALPHA LIPOIC ACID PO Take 1 capsule by mouth daily.   Yes Historical Provider, MD  Calcium Carbonate-Vitamin D (CALCIUM 600+D) 600-400 MG-UNIT tablet Take 1 tablet by mouth daily.   Yes Historical Provider, MD  diltiazem (CARDIZEM CD) 180 MG 24 hr capsule TAKE ONE CAPSULE BY MOUTH EVERY DAY Patient taking differently: Take one capsule by mouth every evening 04/28/16  Yes Pixie Casino, MD  doxepin (SINEQUAN) 75 MG capsule Take 150 mg by mouth at bedtime.    Yes Historical Provider, MD  DULoxetine (CYMBALTA) 30 MG capsule Take 30 mg by mouth daily. Pt takes with a 60mg  capsule.   Yes Historical Provider, MD  DULoxetine (CYMBALTA) 60 MG capsule Take 60 mg by mouth daily. Pt takes with a 30mg  capsule.   Yes Historical Provider, MD  gabapentin (NEURONTIN) 600 MG tablet TAKE ONE TABLET BY MOUTH AT LUNCH AND 2 TABLETS BY MOUTH AT DINNER TIME. 03/19/16  Yes [name], MD  HYDROcodone-acetaminophen (NORCO/VICODIN) 5-325 MG tablet Take 1 tablet by mouth at bedtime as needed for moderate pain. 08/06/16  Yes Asencion Partridge Dohmeier, MD  lamoTRIgine (LAMICTAL) 100 MG tablet Take 1 tablet (100 mg total) by mouth 2 (two) times daily. 06/11/16  Yes Ward Givens, NP  levETIRAcetam (KEPPRA) 750 MG tablet TAKE 2 TABLETS BY MOUTH 3 TIMES A DAY FOR SEIZURES 07/21/16  Yes Carmen Dohmeier, MD  LORazepam (ATIVAN) 1 MG tablet Use 1 mg tab to break focal seizure activity. As needed, do not exceed 2 mg a day. 10/03/14  Yes Carmen Dohmeier, MD  Multiple Vitamin (MULTIVITAMIN WITH MINERALS) TABS Take 1 tablet by mouth daily.   Yes Historical Provider, MD  phenytoin (DILANTIN) 100 MG ER capsule Take 3 capsules (300 mg total) by mouth at bedtime. 06/11/16  Yes Ward Givens, NP  PRADAXA 150 MG CAPS capsule TAKE 1 CAPSULE (150 MG TOTAL) BY MOUTH 2 (TWO) TIMES DAILY. 08/14/16  Yes Pixie Casino, MD  pregabalin (LYRICA) 75 MG capsule Take 75 mg by mouth 3 (three) times daily.    Yes Historical Provider, MD  vitamin B-12 (CYANOCOBALAMIN) 1000 MCG tablet Take 1,000 mcg by mouth daily.    Yes Historical Provider, MD    Family History Family History  Problem Relation Age of Onset  . Vision loss Father   . Hypertension Father   . Diabetes Father   . Heart disease Father   . Cancer Father     colon  . Diabetes Mother   . Hypertension Mother   . Heart disease Mother   . Dementia Mother   . Cancer Mother     breast ca  . Diabetes Sister   . Hyperlipidemia Sister   . Hypertension Sister   . Diabetes Brother   . Kidney disease Brother   . Heart disease Brother     Social History Social History  Substance Use Topics  . Smoking status:  Former Smoker    Types: Pipe    Quit date: 08/10/2013  . Smokeless tobacco: Never Used  . Alcohol use No     Allergies   Penicillins   Review of Systems Review of Systems  Constitutional: Negative for fever.  Gastrointestinal: Positive for abdominal pain. Negative for diarrhea and vomiting.  All other systems reviewed and are negative.    Physical Exam Updated Vital Signs BP (!) 172/94 (BP Location: Left Arm)   Pulse 94   Temp 97.8 F (36.6 C)   Resp 20   SpO2 92%   Physical Exam  Constitutional: He is oriented to person, place, and time. He appears well-developed and well-nourished. No distress.  HENT:  Head: Normocephalic and atraumatic.  Nose: Nose normal.  Eyes: Conjunctivae are normal.  Neck: Neck supple. No tracheal deviation present.  Cardiovascular: Normal rate, regular rhythm and normal heart sounds.   Pulmonary/Chest: Effort normal and breath sounds normal. No respiratory distress.  Abdominal: Soft. He exhibits no distension. There is no tenderness. There is no rebound and no guarding.  Morbidly obese  Musculoskeletal:       Lumbar back: He exhibits tenderness.  atrophe of lower extremities with  decreased strength b/l. Stage 1 pressure ulceration diffusely over sacrum  Neurological: He is alert and oriented to person, place, and time.  Skin: Skin is warm and dry.  Psychiatric: He has a normal mood and affect.  Vitals reviewed.    ED Treatments / Results  Labs (all labs ordered are listed, but only abnormal results are displayed) Labs Reviewed  COMPREHENSIVE METABOLIC PANEL - Abnormal; Notable for the following:       Result Value   Glucose, Bld 203 (*)    Calcium 8.4 (*)    Albumin 3.4 (*)    AST 52 (*)    All other components within normal limits  LIPASE, BLOOD  CBC    EKG  EKG Interpretation None       Radiology No results found.  Procedures Procedures (including critical care time)  Medications Ordered in ED Medications - No data to display   Initial Impression / Assessment and Plan / ED Course  I have reviewed the triage vital signs and the nursing notes.  Pertinent labs & imaging results that were available during my care of the patient were reviewed by me and considered in my medical decision making (see chart for details).     66 y.o. male presents with chronic lower abdominal pain that has been progressive and severe b/l LE neuropathy. None of these symptoms are new. Labs reassuring. Exam reassuring for lack of surgical emergency or other significant derangement. Discussed alternative medications for pain control but explained that most of his symptoms are sequela of being bedbound for 2 years. He had been in physical therapy before but said he wasn't progressing so he stopped. I explained that unless he becomes more mobile he will continue to deteriorate and was offered home health referral which was placed. Plan to follow up with PCP as needed and return precautions discussed for worsening or new concerning symptoms.   Final Clinical Impressions(s) / ED Diagnoses   Final diagnoses:  Chronic bilateral lower abdominal pain  Neuropathy [facility])    Physical deconditioning    New Prescriptions New Prescriptions   No medications on file     Leo Grosser, MD 08/22/16 0222

## 2016-08-22 NOTE — ED Notes (Signed)
Multiple staff members helped EMS move pt to stretcher and load pt into ambulance to transport home.

## 2016-08-22 NOTE — ED Notes (Signed)
[facility] was called for transport back home.  Await [facility] arrival

## 2016-09-03 ENCOUNTER — Other Ambulatory Visit: Payer: Self-pay | Admitting: Neurology

## 2016-09-04 MED ORDER — HYDROCODONE-ACETAMINOPHEN 5-325 MG PO TABS: 1.0000 | ORAL_TABLET | Freq: Every evening | ORAL | 0 refills | Status: DC | PRN

## 2016-09-04 NOTE — Telephone Encounter (Signed)
I reviewed her chart, patient was seen by Dr. Brett Fairy for epilepsy, painful peripheral neuropathy,  He was getting prescription of hydrocodone/Tylenol, 5/325 mg 30 tablets on August 06 2016,  It is okay to give him hydrocodone/Tylenol 5/325 mg 30 tablets without refill this time, given

## 2016-09-08 ENCOUNTER — Other Ambulatory Visit: Payer: Self-pay | Admitting: Neurology

## 2016-09-16 ENCOUNTER — Other Ambulatory Visit: Payer: Self-pay | Admitting: *Deleted

## 2016-09-16 NOTE — Telephone Encounter (Signed)
Received fax request for lamictal 25mg  po tabs.  Pt has had increase to 100mg  po bid since 05/2016.  Did not refill.

## 2016-10-03 ENCOUNTER — Other Ambulatory Visit: Payer: Self-pay | Admitting: Neurology

## 2016-10-07 MED ORDER — HYDROCODONE-ACETAMINOPHEN 5-325 MG PO TABS: 1.0000 | ORAL_TABLET | Freq: Every evening | ORAL | 0 refills | Status: DC | PRN

## 2016-11-02 ENCOUNTER — Other Ambulatory Visit: Payer: Self-pay | Admitting: Neurology

## 2016-11-03 MED ORDER — HYDROCODONE-ACETAMINOPHEN 5-325 MG PO TABS: 1.0000 | ORAL_TABLET | Freq: Every evening | ORAL | 0 refills | Status: DC | PRN

## 2016-11-03 NOTE — Telephone Encounter (Signed)
Rx signed and placed up front for pickup. 

## 2016-11-26 ENCOUNTER — Ambulatory Visit (INDEPENDENT_AMBULATORY_CARE_PROVIDER_SITE_OTHER): Payer: Medicare Other | Admitting: Neurology

## 2016-11-26 ENCOUNTER — Encounter: Payer: Self-pay | Admitting: Neurology

## 2016-11-26 VITALS — BP 176/92 | Temp 89.0°F | Ht 77.0 in

## 2016-11-26 DIAGNOSIS — G7109 Other specified muscular dystrophies: Secondary | ICD-10-CM

## 2016-11-26 DIAGNOSIS — G710 Muscular dystrophy: Secondary | ICD-10-CM | POA: Diagnosis not present

## 2016-11-26 DIAGNOSIS — G40A09 Absence epileptic syndrome, not intractable, without status epilepticus: Secondary | ICD-10-CM | POA: Diagnosis not present

## 2016-11-26 DIAGNOSIS — G629 Polyneuropathy, unspecified: Secondary | ICD-10-CM | POA: Diagnosis not present

## 2016-11-26 MED ORDER — LAMOTRIGINE 150 MG PO TABS: 150.0000 mg | ORAL_TABLET | Freq: Two times a day (BID) | ORAL | 3 refills | Status: DC

## 2016-11-26 MED ORDER — LEVETIRACETAM 750 MG PO TABS: 1500.0000 mg | ORAL_TABLET | Freq: Two times a day (BID) | ORAL | 3 refills | Status: DC

## 2016-11-26 MED ORDER — PHENYTOIN SODIUM EXTENDED 100 MG PO CAPS: ORAL_CAPSULE | ORAL | 0 refills | Status: DC

## 2016-11-26 MED ORDER — HYDROCODONE-ACETAMINOPHEN 5-325 MG PO TABS: 1.0000 | ORAL_TABLET | Freq: Every evening | ORAL | 0 refills | Status: DC | PRN

## 2016-11-26 NOTE — Addendum Note (Signed)
Addended by: [name] on: 11/26/2016 11:57 AM   Modules accepted: Orders

## 2016-11-26 NOTE — Progress Notes (Signed)
PATIENT: [name] DOB: 02/24/1951  REASON FOR VISIT: follow up- seizures, neuropathy HISTORY FROM: patient  HISTORY OF PRESENT ILLNESS:   I see [name] today, a 66 year old Caucasian gentleman with advanced muscular degeneration, he is presenting on a stretcher. He as years ago lost all his deep tendon reflexes, has often pain at the joint especially in the lower extremities at the ankle on the right for example. Muscle atrophy has caused him to have flaccid muscles. Deep tendon reflexes also missing from the upper extremities. An appointment with neuromuscular consultant is requested - this patient has no diagnosis for the cause of his disability.  Begun in lower extremities , first cane than walker , 2012 . Since than progressed to stretcher. Has a now a colostomy, has no core strength, has some residual foot and arm movement. DVTs from immobility.    He continues to present also as a seizure patient who also presents with a seizure disorder, manifesting as absence seizures, not GTC. He has been on Dilantin for a long time but my goal is to wean him slowly off and exchange with another seizure medication. He also presents today with a really high blood pressure. The patient had been started on Lamictal 100 mg twice a day and his sister confirms that his spells seem to have decreased in frequency. He remains on Keppra, which I will also decrease to 1500 mg bid from tid dosing.  . I have increased his Lamictal today 250 mg twice a day and I am starting to wean him off Dilantin 30 days on 200 mg, followed by another 30 days on 100 mg at night only then discontinue. The goal is to eliminate Dilantin as a contributor to neuropathy. He will have blood drawn today for CMET, CBC,  dilantin level .  The pain specialist added that he had nothing to offer above or beyond what she is already on.  He has followed up with several pain specialists  was prescribed hydrocodone APAP and this  helps him at night with the pain - this far the very best.  Since his sister manages the pain medication for him I think that the occasional hydrocodone APAP at night would out weigh the risks associated with the patient ( he is immobile, has a history of somewhat decreased respirations, morbid obesity, sleep apnea) but is in believable pain. .       [name] is a 66 year old male with a history of epilepsy and neuropathy. He returns today for follow-up. The patient arrives today on a stretcher. He is currently on Keppra 1500 milligrams 3 times a day for seizures. He is also on Dilantin 400 mg at bedtime. His sister reports that he's did not have any seizures for several months until last week. She states that last week he had 3 absence seizures. Patient denies being sick, denies sleep deprivation and denies missing any medication during this timeframe. The sister does state that his PCP increased his Cymbalta during this time. She states that since then he has not had any additional seizures. Patient states that he continues to have discomfort in the lower extremities. He continues to use Gralise and Cymbalta. As mentioned previously his Cymbalta was increased by his primary care provider. The patient is completely bedridden. He states that his lower extremities are very weak limiting his mobility. The patient did have physical therapy but unfortunatelydid not progress as they desire. The patient denies urinary incontinence. He uses a  urinal when needed. The patient has an ostomy bag. I discussed a motorized wheelchair and possible lift chair for the patient. However the patient states that he is more comfortable laying down. Patient and his sister deny any new neurological symptoms. He returns today for an evaluation.   HISTORY 10/03/14 [name]): [name] is a 66 y.o. male, presenting on a stretcher, seen here as a referral from [name] for new evaluation of Epilepsy, Neuropathy .    This patient has followed with [name] prior to his retirement and was last seen 02-11-2011.  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 His head would turn to the left repeatedly , he did not lose awareness. The patient was placed on Dilantin brand name under milligrams 3 times a day alternating with 100 mg 4 times a day every other day. He was also on Keppra in the generic form of levetiracetam 1500 mg 3 times a day. He had been tried on topiramate but it caused him to have a stomach upset. Since the patient has severe neuropathy and chronic venostasis he has also been unsteady on his feet and a high fall risk was assessed.  He has psoriasis and easily bruises he fell in his home and broke his left ankle in a fall in February 2012 . he now fell , stumbling over his walker and broke his right ankle April 2015, he spent a month in rehabilitation, developed a megacolon, he has a urinary catheter - Foley infected -he had to be readmitted with urosepsis, he has a colostomy bag which also has let to skin lacerations while in a facility not adequately cared for.  His sister seems to be the one to change and since she Provides skin care he has no repeated infections, the folwy has been replaced. The last lab level was obtained through Bruce. Erling [name] on 11-19-2010 with a normal CBC complex metabolic panel and a phenytoin level of 6. He also ordered a DEXA scan last 2008 and that was normal. A normal bone density test from March 2013 was normal -according to Bruce. Mathis [name] note . He has progressive numbness in his legs and feet but weaning him off Dilantin was not successful and increased his seizure frequency. It is likely that his neuropathy is partially caused by Dilantin.  Interval history on 07-05-14. [name] was admitted in status epilepticus on 05/17/2014 to the [facility] and  followed by Bruce. Cruzita [name],  He underwent multiple multiple brain scans which did not show an intracranial abnormality it was also noted that he was unable to swallow any medications during his status and therefore received Dilantin and Keppra intravenously. He was intubated for a couple of days. The status epilepticus was broken by being given IV medications. He is back on Dilantin and I have increased that in his last visit to 400 mg at night instead of alternating 300 with 400 we will resume this today. I refill his Keppra today as well as his Neurontin which I further increased by 600 mg. Neurontin also an antiepileptic medication is used in his case to treat his significant sensory abnormalities related to a neuropathy. What is remarkable to me is that the patient has almost no control over his lower extremities and lost all muscle tone by his upper extremities provide good grip strength and normal movement and tone. A nerve conduction study and EMG with Bruce. Jannifer Franklin  just documented neuropathy. The admission to the hospital was precipitated by developing pneumonia. The CT scan of the head was reviewed and the report is copied to today's note. I have discussed with the patient today that I would like for him to undergo a thoracic spine MRI if possible his last brain MRI by my records is from 2004, ordered by Bruce. Jannifer Franklin at the time. In addition I do not need a brain MRI at this point I think that it is clear that the pneumonia cost him to have the seizures which is not an unknown or uncommon trigger. Any infection below the seizure threshold. What I would like to research further why his lower extremities are disproportionately affected by a neuropathy.  Interval history from 10-03-14. She no longer has physical therapy provided through [facility] payment, he and his sister are using some weight exercises at home. He has also healed from this macerated skin near the colostomy,. A honey-based product has been helpful  in establishing better nor skin. Bruce. Cornelious Bryant thousand patient and exchanged his nocturnal time dose of gabapentin with doxepin. This helped him to sleep. Does not help the pain. He also has a rectal pain pulled endorsed neuropathy. I would like for Bruce. Cornelious Bryant to have another visit with the patient also wondered if she couldperform any kind of study on the pudendal nerve?.CD  Interval history from 10/02/2015, Mr. Feenstra has not had any significant seizure activity over the last 12 months at least. His main problem is that due to his immobility and venostasis he has painful neuropathy, venostasis and leg ulcers.    REVIEW OF SYSTEMS: Out of a complete 14 system review of symptoms, the patient complains only of the following symptoms, and all other reviewed systems are negative.  Leg pain, muscle cramps,  Absence seizures, ulcers from venous stasis . dupuytren contracture.   ALLERGIES: Allergies  Allergen Reactions  . Penicillins Other (See Comments)    Pt has no personal reaction to this med, but his mother/brother do. Has patient had a PCN reaction causing immediate rash, facial/tongue/throat swelling, SOB or lightheadedness with hypotension: No Has patient had a PCN reaction causing severe rash involving mucus membranes or skin necrosis: No Has patient had a PCN reaction that required hospitalization No Has patient had a PCN reaction occurring within the last 10 years: No If all of the above answers are "NO", then may proceed with Cephalosporin    HOME MEDICATIONS: Outpatient Medications Prior to Visit  Medication Sig Dispense Refill  . acetaminophen (TYLENOL) 500 MG tablet Take 500-1,000 mg by mouth every 6 (six) hours as needed for mild pain, moderate pain, fever or headache.    . ALPHA LIPOIC ACID PO Take 1 capsule by mouth daily.    . Calcium Carbonate-Vitamin D (CALCIUM 600+D) 600-400 MG-UNIT tablet Take 1 tablet by mouth daily.    . diclofenac sodium (VOLTAREN) 1 % GEL Apply 4 g  topically 4 (four) times daily. 100 g 0  . diltiazem (CARDIZEM CD) 180 MG 24 hr capsule TAKE ONE CAPSULE BY MOUTH EVERY DAY (Patient taking differently: Take one capsule by mouth every evening) 30 capsule 10  . doxepin (SINEQUAN) 75 MG capsule Take 150 mg by mouth at bedtime.   5  . DULoxetine (CYMBALTA) 30 MG capsule Take 30 mg by mouth daily. Pt takes with a 60mg  capsule.    . DULoxetine (CYMBALTA) 60 MG capsule Take 60 mg by mouth daily. Pt takes with a 30mg  capsule.    Marland Kitchen  gabapentin (NEURONTIN) 600 MG tablet TAKE 1 TABLET BY MOUTH AT LUNCH AND 2 TABLETS AT DINNER TIME 90 tablet 5  . HYDROcodone-acetaminophen (NORCO/VICODIN) 5-325 MG tablet Take 1 tablet by mouth at bedtime as needed for moderate pain. 30 tablet 0  . lamoTRIgine (LAMICTAL) 100 MG tablet Take 1 tablet (100 mg total) by mouth 2 (two) times daily. 60 tablet 11  . levETIRAcetam (KEPPRA) 750 MG tablet TAKE 2 TABLETS BY MOUTH 3 TIMES A DAY FOR SEIZURES 540 tablet 3  . LORazepam (ATIVAN) 1 MG tablet Use 1 mg tab to break focal seizure activity. As needed, do not exceed 2 mg a day. 30 tablet 0  . Multiple Vitamin (MULTIVITAMIN WITH MINERALS) TABS Take 1 tablet by mouth daily.    . phenytoin (DILANTIN) 100 MG ER capsule Take 3 capsules (300 mg total) by mouth at bedtime. 270 capsule 3  . PRADAXA 150 MG CAPS capsule TAKE 1 CAPSULE (150 MG TOTAL) BY MOUTH 2 (TWO) TIMES DAILY. 60 capsule 3  . pregabalin (LYRICA) 75 MG capsule Take 75 mg by mouth 3 (three) times daily.     . vitamin B-12 (CYANOCOBALAMIN) 1000 MCG tablet Take 1,000 mcg by mouth daily.     . Capsaicin-Menthol-Methyl Sal (CAPSAICIN-METHYL SAL-MENTHOL) 0.025-1-12 % CREA Apply 1 application topically 4 (four) times daily as needed (pain). 56.6 g 0  . lidocaine (LIDODERM) 5 % Place 1 patch onto the skin daily as needed (pain). Remove & Discard patch within 12 hours or as directed by MD. Please remove patch before applying new patch. 30 patch 0   No facility-administered medications  prior to visit.     PAST MEDICAL HISTORY: Past Medical History:  Diagnosis Date  . Arthritis   . Atrial fibrillation ([place])    a. Dx 05/2014 in setting of seizure, PNA. S/p TEE/DCCV.  Marland Kitchen Bowel perforation ([place])    a. transvere and R colectomy and ileostomy in 12/25/13.  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 Deep vein thrombosis of right lower extremity ([place])   . Depression   . DVT (deep venous thrombosis) ([facility])    multiple times  . Hyperlipidemia   . Hyperplasia of prostate   . Obesity   . Peripheral neuropathy   . PFO (patent foramen ovale)    a. 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  . Psoriasis   . Seizures ([place])   . Venous stasis   . Weakness of both legs     PAST SURGICAL HISTORY: Past Surgical History:  Procedure Laterality Date  . ANKLE FRACTURE SURGERY  09/04/13  . CARDIOVERSION N/A 05/24/2014   Procedure: CARDIOVERSION;  Surgeon: Josue Hector, MD;  Location: [facility] ENDOSCOPY;  Service: Cardiovascular;  Laterality: N/A;  . COLONOSCOPY  03/26/2012   Procedure: COLONOSCOPY;  Surgeon: Beryle Beams, MD;  Location: [facility] ENDOSCOPY;  Service: Endoscopy;  Laterality: N/A;  . COLOSTOMY  12-25-13   Ruptured colon  . FLEXIBLE SIGMOIDOSCOPY N/A 12/09/2013   Procedure: FLEXIBLE SIGMOIDOSCOPY;  Surgeon: Beryle Beams, MD;  Location: [facility] ENDOSCOPY;  Service: Endoscopy;  Laterality: N/A;  . LAPAROTOMY N/A 12/25/2013   Procedure: EXPLORATORY LAPAROTOMY/Transverse and right coloectomy/ end  ileostomy and mucus  fistula;  Surgeon: Ralene Ok, MD;  Location: [facility];  Service: General;  Laterality: N/A;  . TEE WITHOUT CARDIOVERSION N/A 05/24/2014   Procedure: TRANSESOPHAGEAL ECHOCARDIOGRAM (TEE);  Surgeon: Josue Hector, MD;  Location: [place];  Service: Cardiovascular;  Laterality:  N/A;  . TONSILLECTOMY     as child    FAMILY HISTORY: Family History  Problem Relation Age of Onset  . Vision loss Father   . Hypertension  Father   . Diabetes Father   . Heart disease Father   . Cancer Father        colon  . Diabetes Mother   . Hypertension Mother   . Heart disease Mother   . Dementia Mother   . Cancer Mother        breast ca  . Diabetes Sister   . Hyperlipidemia Sister   . Hypertension Sister   . Diabetes Brother   . Kidney disease Brother   . Heart disease Brother     SOCIAL HISTORY: Social History   Social History  . Marital status: Single    Spouse name: N/A  . Number of children: 0  . Years of education: HS   Occupational History  . Not on file.   Social History Main Topics  . Smoking status: Former Smoker    Types: Pipe    Quit date: 08/10/2013  . Smokeless tobacco: Never Used  . Alcohol use No  . Drug use: No  . Sexual activity: Not on file   Other Topics Concern  . Not on file   Social History Narrative   Patient is single and lives with his sister.   Patient is retired from [employer].   Patient has a high school education.   Patient drinks one cup of caffeine daily.   Patient is right-handed.            PHYSICAL EXAM  Vitals:   11/26/16 1055  BP: (!) 176/92  Temp: (!) 89 F (31.7 C)  Height: 6\' 5"  (1.956 m)   There is no height or weight on file to calculate BMI.  Generalized: Well developed, in no acute distress   Neurological examination  Mentation: Alert oriented to time, place, history taking. Follows all commands speech and language fluent Cranial nerve : Sense of taste and smell intact.  Pupils were equal round reactive to light. Extraocular movements were full, visual field were full on confrontational test. Facial sensation and strength were normal. Uvula tongue midline. Head turning and shoulder shrug  were normal and symmetric. Motor: Normal  strength in the upper extreme wrist. No strength in feet rotation, pushing, knee felxion and hip rotation- 2-5 . Non antigravity.  Sensory: Sensory testing is intact to soft touch on the upper extremities only.   Pinprick sensation decreased to absent  in the lower extremity is in a stocking-like pattern.  UNable as usual to perform heel to shin. Gait and station:  He arrived today on a stretcher. Reflexes: Deep tendon reflexes are symmetric and normal bilaterally.   DIAGNOSTIC DATA (LABS, IMAGING, TESTING) - I reviewed patient records, labs, notes, testing and imaging myself where available.  Lab Results  Component Value Date   TSH 1.667 05/22/2014    ASSESSMENT AND PLAN  40 minute visit with immobilized patient  in pain- Brought by ambulance. Has responded better in terms of seizure control.   .   1. Seizures reduced on Lamictal.  Now we are weaning off  Dilantin bu 100 mg per month, I reduced Keppra to bid 1500 mg  ( see above)   2. Peripheral neuropathy with pain, loss of muscle tone and bulk- atrophy. 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 Has pain in the hand and ankle. NO EMG as he is  on chronic anticoagulation.  He ahs carpal tunnel- but would not undergo surgery.    3. Vasculopathy, venostasis, DVT  - Ulcers. On chronic anticoagulation.   He has been on Dilantin for a long time but my goal is to wean him slowly off and exchange with another seizure medication. He also presents today with a really high blood pressure. The patient had been started on Lamictal 100 mg twice a day and his sister confirms that his spells seem to have decreased in frequency. He remains on Keppra, which I will also decrease to 1500 mg bid from tid dosing.  . I have increased his Lamictal today 250 mg twice a day and I am starting to wean him off Dilantin 30 days on 200 mg, followed by another 30 days on 100 mg at night only then discontinue. The goal is to eliminate Dilantin as a contributor to neuropathy. He will have blood drawn today for CMET, CBC,  dilantin level .  The patient will continue on Dilantin and Keppra. Patient cannot continue using Gralise for neuropathy discomfort, It is no longer covered  by his insurance-  therefore I  switched him back to gabapentin 600 mg immediate release. He will use 1 at lunch 2 at dinner.   He still has significant pain at night and apparently was seen by [facility] in [place], or a pain specialist, he was told that due to his chronic anticoagulation he is not a candidate for interventional injection etc and therefore has to be managed with oral medications or topicals. The pain specialist added that he had nothing to offer above or beyond what she is already on.  He has followed up with several pain specialists  was prescribed hydrocodone APAP and this helps him at night with the pain - this far the very best.  Since his sister manages the pain medication for him I think that the occasional hydrocodone APAP at night would out weigh the risks associated with the patient ( he is immobile, has a history of somewhat decreased respirations, morbid obesity, sleep apnea) but is in believable pain. Marland Kitchen  He should also let us know how he does with his absence and other seizures.  He will follow-up in 6 months with NP.     [name], MD  11/26/2016, 11:16 AM [facility] [address] [place], [place] [address] [phone]

## 2016-11-26 NOTE — Patient Instructions (Signed)
Muscular Dystrophy Muscular dystrophy is any condition in a group of diseases that are characterized by muscle weakness and muscle loss. Most types of muscular dystrophy begin to cause symptoms during childhood. Some types do not start causing symptoms until later in life. Different types of muscular dystrophy affect different muscles. Muscle weakness and muscle loss can occur in the muscles that are used for body movements (voluntary muscles) or in the muscles that are used for breathing and heartbeat (involuntary muscles). Some types of muscular dystrophy are more severe than others. Most types tend to get worse over time. Some people may eventually have trouble walking. For some people, muscular dystrophy can affect the heart or the lungs. The most common types of muscular dystrophy include:  Duchenne muscular dystrophy. This is the most common type. Symptoms usually begin by age 5 and start in the upper arms and legs.  Becker muscular dystrophy. This type is usually milder than Duchenne. Symptoms usually begin by age 12 and start in the arms and legs.  Myotonic muscular dystrophy. This is the most common type that affects adults. Symptoms usually begin by age 30. This type makes muscles tight and unable to relax. It usually starts in the arms, face, hands, or legs.  Limb-girdle muscular dystrophy. Weakness may begin in childhood or in adult years. This type starts in the hips and shoulders.  Facioscapulohumeral muscular dystrophy. This type affects the face and shoulders. Symptoms may start in childhood or adult years.  Congenital muscular dystrophy. This type is present at birth. It causes muscle weakness in the upper body and legs.  What are the causes? All types of muscular dystrophy are caused by changes (mutations) in the genes that are needed to make certain proteins for healthy muscles.  Different mutations cause different types of muscular dystrophy.  The gene mutation that causes  muscular dystrophy may be passed down through a family (inherited), or it may develop spontaneously. A spontaneous gene mutation may then be inherited.  What increases the risk? This condition is more likely to develop in:  People who have a family history of the condition.  Males. The most common types of muscular dystrophy occur more often in boys than in girls.  What are the signs or symptoms? Symptoms vary depending on the type of muscular dystrophy. Early signs and symptoms of muscular dystrophy in childhood (Duchenne and Becker) include:  Frequent falling or clumsiness.  Having a hard time getting up into a standing position.  Trouble running, walking, jumping, or climbing stairs.  A waddling walk.  Walking on toes.  Painful or stiff muscles.  Symptoms of more advanced muscular dystrophy may include:  Becoming unable to walk.  Curving of the spine.  Trouble swallowing.  Trouble breathing.  Heart failure.  How is this diagnosed? This condition may be diagnosed with:  A physical exam. This may include tests to check muscle strength and to observe the way that you walk (gait).  Blood tests to check for: ? Evidence of muscle damage (creatine kinaseenzymes). ? Genetic mutations.  Tests to measure the electrical activity of your muscles (electromyogram).  A procedure to remove a sample of muscle fibers to be examined under a microscope (muscle biopsy).  Heart and lung function tests.  How is this treated? There is no cure for this condition. However, treatment may help you stay active and may slow down the advance of the disease. Treatment may include:  Range-of-motion, stretching, and strengthening exercises (physical therapy).  Assistive devices, such   as braces, canes, walkers, or wheelchairs.  Medicines to reduce swelling (steroids).  Medicines to improve heart function and bone health.  A breathing machine (ventilator).  Surgery to correct or  prevent curving of the spine.  A feeding tube to assist with proper nutrition.  Counseling to help address mental health issues that result from the condition or occur as side effects of treatment.  Follow these instructions at home:  Learn as much as you can about muscular dystrophy, and work closely with your treatment team. Home care needs may change over time.  Try to stay physically active. Ask your health care provider what level of physical activity is safe for you.  Do physical therapy exercises at home as directed by your physical therapist.  Try to maintain a healthy weight. Ask your health care provider for advice about diet.  Ask your health care provider to recommend resources for emotional and caregiving support. A good source for finding support is the Muscular Dystrophy Association at the following web address: ? www.mda.org/services/finding-support  Keep all follow-up visits as directed by your health care provider. This is important. Contact a health care provider if:  Your symptoms change.  You have trouble staying active at home. Get help right away if:  You have problems with breathing or swallowing.  It is no longer safe or possible for you to stay active at home. This information is not intended to replace advice given to you by your health care provider. Make sure you discuss any questions you have with your health care provider. Document Released: 04/18/2002 Document Revised: 10/04/2015 Document Reviewed: 05/03/2014 Elsevier Interactive Patient Education  2018 Elsevier Inc.  

## 2016-11-27 LAB — COMPREHENSIVE METABOLIC PANEL
A/G RATIO: 1.1 — AB (ref 1.2–2.2)
ALT: 32 IU/L (ref 0–44)
AST: 33 IU/L (ref 0–40)
Albumin: 3.3 g/dL — ABNORMAL LOW (ref 3.6–4.8)
Alkaline Phosphatase: 102 IU/L (ref 39–117)
BUN/Creatinine Ratio: 17 (ref 10–24)
BUN: 9 mg/dL (ref 8–27)
CALCIUM: 8.8 mg/dL (ref 8.6–10.2)
CHLORIDE: 97 mmol/L (ref 96–106)
CO2: 25 mmol/L (ref 20–29)
Creatinine, Ser: 0.54 mg/dL — ABNORMAL LOW (ref 0.76–1.27)
GFR calc Af Amer: 128 mL/min/{1.73_m2} (ref >59)
GFR, EST NON AFRICAN AMERICAN: 111 mL/min/{1.73_m2} (ref >59)
GLUCOSE: 196 mg/dL — AB (ref 65–99)
Globulin, Total: 3.1 g/dL (ref 1.5–4.5)
POTASSIUM: 4.3 mmol/L (ref 3.5–5.2)
Sodium: 138 mmol/L (ref 134–144)
TOTAL PROTEIN: 6.4 g/dL (ref 6.0–8.5)

## 2016-11-27 LAB — CBC WITH DIFFERENTIAL/PLATELET
Basophils Absolute: 0 x10E3/uL (ref 0.0–0.2)
Basos: 0 %
EOS (ABSOLUTE): 0.4 x10E3/uL (ref 0.0–0.4)
Eos: 6 %
Hematocrit: 43.9 % (ref 37.5–51.0)
Hemoglobin: 13.9 g/dL (ref 13.0–17.7)
Immature Grans (Abs): 0 x10E3/uL (ref 0.0–0.1)
Immature Granulocytes: 1 %
Lymphocytes Absolute: 1 x10E3/uL (ref 0.7–3.1)
Lymphs: 15 %
MCH: 30.9 pg (ref 26.6–33.0)
MCHC: 31.7 g/dL (ref 31.5–35.7)
MCV: 98 fL — ABNORMAL HIGH (ref 79–97)
Monocytes Absolute: 0.6 x10E3/uL (ref 0.1–0.9)
Monocytes: 10 %
Neutrophils Absolute: 4.4 x10E3/uL (ref 1.4–7.0)
Neutrophils: 68 %
Platelets: 176 x10E3/uL (ref 150–379)
RBC: 4.5 x10E6/uL (ref 4.14–5.80)
RDW: 14.7 % (ref 12.3–15.4)
WBC: 6.4 x10E3/uL (ref 3.4–10.8)

## 2016-11-27 LAB — PHENYTOIN LEVEL, TOTAL: Phenytoin (Dilantin), Serum: 3.7 ug/mL — ABNORMAL LOW (ref 10.0–20.0)

## 2016-12-01 ENCOUNTER — Telehealth: Payer: Self-pay | Admitting: Neurology

## 2016-12-01 NOTE — Telephone Encounter (Signed)
Spoke with Mrs. Buboltz, per DPR, in reference to pts lab work done. Pt glucose level was elevated. His wife satted that she has been monitoring that and that it has been on elevated side and plans to discuss this with primary care MD. I also discussed lowe renal function and she will make that known to primary care MD. Explained that per Dr Dohmeier recommendation Dilantin level was low and she is ok with continuing to wean off of the Dilantin. Pt wife verbalized understanding and had no further questions at this time.

## 2016-12-01 NOTE — Telephone Encounter (Signed)
-----   Message from [name], MD sent at 11/28/2016 10:58 AM EDT ----- High blood glucose and low renal function- low phenytoin level- we should be able to wean patient off dilantin- this medication has not maintained seizures at current level.  Cc Dr Aura Dials  CD

## 2016-12-02 ENCOUNTER — Telehealth: Payer: Self-pay | Admitting: Neurology

## 2016-12-02 NOTE — Telephone Encounter (Signed)
I received a written communication from [facility] regarding a concern that the patient is on [place] and [place]. Via the process of weaning the patient off Dilantin already and should achieve this by the end of this month. The patient will then rely on seizure control solely on Keppra and Lamictal and remains on gabapentin with the primary goal of controlling neuropathy.CD

## 2016-12-02 NOTE — Telephone Encounter (Signed)
Thanks .. I appreciate the help on that.  -Mali

## 2016-12-07 ENCOUNTER — Other Ambulatory Visit: Payer: Self-pay | Admitting: Internal Medicine

## 2016-12-09 ENCOUNTER — Telehealth: Payer: Self-pay | Admitting: Internal Medicine

## 2016-12-09 NOTE — Telephone Encounter (Signed)
New message    [facility] is calling about a drug interaction.  Pt c/o medication issue:  1. Name of Medication: diltiazem, pradaxa  2. How are you currently taking this medication (dosage and times per day)?  3. Are you having a reaction (difficulty breathing--STAT)?   4. What is your medication issue? Severity one interaction. She states they got a new prescription with a dose increase for the diltiazem.

## 2016-12-10 NOTE — Telephone Encounter (Signed)
Returned call to pharmacy. No significant drug-drug interaction noted with pradaxa and diltiazem.   Okay to dispense as written .

## 2016-12-31 ENCOUNTER — Other Ambulatory Visit: Payer: Self-pay | Admitting: Neurology

## 2016-12-31 MED ORDER — HYDROCODONE-ACETAMINOPHEN 5-325 MG PO TABS: 1.0000 | ORAL_TABLET | Freq: Every evening | ORAL | 0 refills | Status: DC | PRN

## 2017-01-01 ENCOUNTER — Telehealth: Payer: Self-pay | Admitting: Neurology

## 2017-01-01 NOTE — Telephone Encounter (Signed)
Kelly from [facility] called to inform that she received a fax for HYDROcodone-acetaminophen (NORCO/VICODIN) 5-325 MG tablet  she stated they will not be able to fill.  Please have written prescription prepared for pt .  Claiborne Billings has not asked for a call back but could be reached at [phone]  if there are questions for her

## 2017-02-03 ENCOUNTER — Other Ambulatory Visit: Payer: Self-pay | Admitting: Internal Medicine

## 2017-02-13 NOTE — Telephone Encounter (Signed)
ERROR

## 2017-03-16 ENCOUNTER — Other Ambulatory Visit: Payer: Self-pay | Admitting: Neurology

## 2017-03-16 MED ORDER — GABAPENTIN 600 MG PO TABS: ORAL_TABLET | ORAL | 3 refills | Status: DC

## 2017-03-30 ENCOUNTER — Other Ambulatory Visit: Payer: Self-pay | Admitting: Neurology

## 2017-03-30 MED ORDER — HYDROCODONE-ACETAMINOPHEN 5-325 MG PO TABS: 1.0000 | ORAL_TABLET | Freq: Every evening | ORAL | 0 refills | Status: DC | PRN

## 2017-06-02 ENCOUNTER — Encounter: Payer: Self-pay | Admitting: Adult Health

## 2017-06-02 ENCOUNTER — Telehealth: Payer: Self-pay | Admitting: Neurology

## 2017-06-02 ENCOUNTER — Ambulatory Visit (INDEPENDENT_AMBULATORY_CARE_PROVIDER_SITE_OTHER): Payer: Medicare Other | Admitting: Adult Health

## 2017-06-02 ENCOUNTER — Other Ambulatory Visit: Payer: Self-pay | Admitting: Neurology

## 2017-06-02 VITALS — BP 134/77 | HR 80

## 2017-06-02 DIAGNOSIS — R569 Unspecified convulsions: Secondary | ICD-10-CM | POA: Diagnosis not present

## 2017-06-02 DIAGNOSIS — G629 Polyneuropathy, unspecified: Secondary | ICD-10-CM

## 2017-06-02 MED ORDER — HYDROCODONE-ACETAMINOPHEN 5-325 MG PO TABS: 1.0000 | ORAL_TABLET | Freq: Two times a day (BID) | ORAL | 0 refills | Status: DC | PRN

## 2017-06-02 NOTE — Progress Notes (Signed)
PATIENT: [name] DOB: Sep 12, 1950  REASON FOR VISIT: follow up HISTORY FROM: patient  HISTORY OF PRESENT ILLNESS: Today 06/03/17 [name] is a 67 year old male with a history of seizures and neuropathy .  He returns today for an evaluation.  His sister is with him today.  She reports that he may have an Absence seizures every 3-4 months.  She states that he just zones out when they are talking to him.  He remains on Lamictal and Keppra and is tolerating that well.  He is now off of Dilantin.  He continues to have neuropathy pain particularly in the groin area.  The patient is bedridden.  He is unable to sit up without significant discomfort.  He reports that Cymbalta helps him with his neuropathy does not help much with the pain.  He is also on gabapentin and Lyrica.  Lyrica is prescribed by another provider.  He is unsure that these medications are offering him much benefit.  At the last visit Dr. Brett Fairy gave him a prescription of hydrocodone and he reports that this is offered him the most benefit.  He is requesting to take the medication twice a day versus once a day.  He returns today for an evaluation. HISTORY see [name] today, a 67 year old Caucasian gentleman with advanced muscular degeneration, he is presenting on a stretcher. He as years ago lost all his deep tendon reflexes, has often pain at the joint especially in the lower extremities at the ankle on the right for example. Muscle atrophy has caused him to have flaccid muscles. Deep tendon reflexes also missing from the upper extremities. An appointment with neuromuscular consultant is requested - this patient has no diagnosis for the cause of his disability.  Begun in lower extremities , first cane than walker , 2012 . Since than progressed to stretcher. Has a now a colostomy, has no core strength, has some residual foot and arm movement. DVTs from immobility.    He continues to present also as a seizure patient  who also presents with a seizure disorder, manifesting as absence seizures, not GTC. He has been on Dilantin for a long time but my goal is to wean him slowly off and exchange with another seizure medication. He also presents today with a really high blood pressure. The patient had been started on Lamictal 100 mg twice a day and his sister confirms that his spells seem to have decreased in frequency. He remains on Keppra, which I will also decrease to 1500 mg bid from tid dosing.  . I have increased his Lamictal today 250 mg twice a day and I am starting to wean him off Dilantin 30 days on 200 mg, followed by another 30 days on 100 mg at night only then discontinue. The goal is to eliminate Dilantin as a contributor to neuropathy. He will have blood drawn today for CMET, CBC,  dilantin level .  The pain specialist added that he had nothing to offer above or beyond what she is already on.  He has followed up with several pain specialists  was prescribed hydrocodone APAP and this helps him at night with the pain - this far the very best.  Since his sister manages the pain medication for him I think that the occasional hydrocodone APAP at night would out weigh the risks associated with the patient ( he is immobile, has a history of somewhat decreased respirations, morbid obesity, sleep apnea) but is in believable pain. Marland Kitchen  REVIEW OF SYSTEMS: Out of a complete 14 system review of symptoms, the patient complains only of the following symptoms, and all other reviewed systems are negative.  ALLERGIES: Allergies  Allergen Reactions  . Penicillins Other (See Comments)    Pt has no personal reaction to this med, but his mother/brother do. Has patient had a PCN reaction causing immediate rash, facial/tongue/throat swelling, SOB or lightheadedness with hypotension: No Has patient had a PCN reaction causing severe rash involving mucus membranes or skin necrosis: No Has patient had a PCN reaction that required  hospitalization No Has patient had a PCN reaction occurring within the last 10 years: No If all of the above answers are "NO", then may proceed with Cephalosporin    HOME MEDICATIONS: Outpatient Medications Prior to Visit  Medication Sig Dispense Refill  . acetaminophen (TYLENOL) 500 MG tablet Take 500-1,000 mg by mouth every 6 (six) hours as needed for mild pain, moderate pain, fever or headache.    . ALPHA LIPOIC ACID PO Take 1 capsule by mouth daily.    . Calcium Carbonate-Vitamin D (CALCIUM 600+D) 600-400 MG-UNIT tablet Take 1 tablet by mouth daily.    Marland Kitchen diltiazem (CARDIZEM CD) 180 MG 24 hr capsule TAKE ONE CAPSULE BY MOUTH EVERY DAY (Patient taking differently: Take one capsule by mouth every evening) 30 capsule 10  . doxepin (SINEQUAN) 75 MG capsule Take 150 mg by mouth at bedtime.   5  . DULoxetine (CYMBALTA) 30 MG capsule Take 30 mg by mouth daily. Pt takes with a 60mg  capsule.    . DULoxetine (CYMBALTA) 60 MG capsule Take 60 mg by mouth daily. Pt takes with a 30mg  capsule.    . gabapentin (NEURONTIN) 600 MG tablet TAKE 1 TABLET BY MOUTH AT LUNCH AND 2 TABLETS AT DINNER TIME 270 tablet 3  . glipiZIDE (GLUCOTROL XL) 2.5 MG 24 hr tablet Take 2.5 mg by mouth daily.  0  . HYDROcodone-acetaminophen (NORCO/VICODIN) 5-325 MG tablet Take 1 tablet at bedtime as needed by mouth for moderate pain. 90 tablet 0  . lamoTRIgine (LAMICTAL) 150 MG tablet Take 1 tablet (150 mg total) by mouth 2 (two) times daily. 180 tablet 3  . levETIRAcetam (KEPPRA) 750 MG tablet Take 2 tablets (1,500 mg total) by mouth 2 (two) times daily. 180 tablet 3  . LORazepam (ATIVAN) 1 MG tablet Use 1 mg tab to break focal seizure activity. As needed, do not exceed 2 mg a day. 30 tablet 0  . losartan (COZAAR) 50 MG tablet Take 25 mg by mouth 2 (two) times daily.  1  . metFORMIN (GLUCOPHAGE-XR) 500 MG 24 hr tablet 500 mg 2 (two) times daily.  1  . Multiple Vitamin (MULTIVITAMIN WITH MINERALS) TABS Take 1 tablet by mouth daily.     Marland Kitchen PRADAXA 150 MG CAPS capsule TAKE 1 CAPSULE (150 MG TOTAL) BY MOUTH 2 (TWO) TIMES DAILY. 60 capsule 5  . pregabalin (LYRICA) 75 MG capsule Take 75 mg by mouth 3 (three) times daily.     . vitamin B-12 (CYANOCOBALAMIN) 1000 MCG tablet Take 1,000 mcg by mouth daily.     . diclofenac sodium (VOLTAREN) 1 % GEL Apply 4 g topically 4 (four) times daily. (Patient not taking: Reported on 06/02/2017) 100 g 0  . phenytoin (DILANTIN) 100 MG ER capsule Take 2 capsules at night for 30 days, than reduce to 1 cap  For 30 days, than off. (Patient not taking: Reported on 06/02/2017) 90 capsule 0   No facility-administered medications prior  to visit.     PAST MEDICAL HISTORY: Past Medical History:  Diagnosis Date  . Arthritis   . Atrial fibrillation ([employer])    a. Dx 05/2014 in setting of seizure, PNA. S/p TEE/DCCV.  Marland Kitchen Bowel perforation ([place])    a. transvere and R colectomy and ileostomy in 12/25/13.  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 Deep vein thrombosis of right lower extremity ([place])   . Depression   . DVT (deep venous thrombosis) ([facility])    multiple times  . Hyperlipidemia   . Hyperplasia of prostate   . Obesity   . Peripheral neuropathy   . PFO (patent foramen ovale)    a. 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  . Psoriasis   . Seizures ([place])   . Venous stasis   . Weakness of both legs     PAST SURGICAL HISTORY: Past Surgical History:  Procedure Laterality Date  . ANKLE FRACTURE SURGERY  09/04/13  . CARDIOVERSION N/A 05/24/2014   Procedure: CARDIOVERSION;  Surgeon: Josue Hector, MD;  Location: [facility] ENDOSCOPY;  Service: Cardiovascular;  Laterality: N/A;  . COLONOSCOPY  03/26/2012   Procedure: COLONOSCOPY;  Surgeon: Beryle Beams, MD;  Location: [facility] ENDOSCOPY;  Service: Endoscopy;  Laterality: N/A;  . COLOSTOMY  12-25-13   Ruptured colon  . FLEXIBLE SIGMOIDOSCOPY N/A 12/09/2013   Procedure: FLEXIBLE SIGMOIDOSCOPY;  Surgeon: Beryle Beams, MD;  Location: [facility] ENDOSCOPY;  Service: Endoscopy;  Laterality: N/A;  . LAPAROTOMY N/A 12/25/2013   Procedure: EXPLORATORY LAPAROTOMY/Transverse and right coloectomy/ end  ileostomy and mucus  fistula;  Surgeon: Ralene Ok, MD;  Location: [place];  Service: General;  Laterality: N/A;  . TEE WITHOUT CARDIOVERSION N/A 05/24/2014   Procedure: TRANSESOPHAGEAL ECHOCARDIOGRAM (TEE);  Surgeon: Josue Hector, MD;  Location: [facility] ENDOSCOPY;  Service: Cardiovascular;  Laterality: N/A;  . TONSILLECTOMY     as child    FAMILY HISTORY: Family History  Problem Relation Age of Onset  . Vision loss Father   . Hypertension Father   . Diabetes Father   . Heart disease Father   . Cancer Father        colon  . Diabetes Mother   . Hypertension Mother   . Heart disease Mother   . Dementia Mother   . Cancer Mother        breast ca  . Diabetes Sister   . Hyperlipidemia Sister   . Hypertension Sister   . Diabetes Brother   . Kidney disease Brother   . Heart disease Brother     SOCIAL HISTORY: Social History   Socioeconomic History  . Marital status: Single    Spouse name: Not on file  . Number of children: 0  . Years of education: HS  . Highest education level: Not on file  Social Needs  . Financial resource strain: Not on file  . Food insecurity - worry: Not on file  . Food insecurity - inability: Not on file  . Transportation needs - medical: Not on file  . Transportation needs - non-medical: Not on file  Occupational History  . Not on file  Tobacco Use  . Smoking status: Former Smoker    Types: Pipe    Last attempt to quit: 08/10/2013    Years since quitting: 3.8  . Smokeless tobacco: Never Used  Substance and Sexual Activity  . Alcohol use: No    Alcohol/week: 0.0 oz  . Drug  use: No  . Sexual activity: Not on file  Other Topics Concern  . Not on file  Social History Narrative   Patient is single and lives with his sister.   Patient is retired from [employer].   Patient has  a high school education.   Patient drinks one cup of caffeine daily.   Patient is right-handed.         PHYSICAL EXAM  Vitals:   06/02/17 1105  BP: 134/77  Pulse: 80   There is no height or weight on file to calculate BMI.  Generalized: Well developed, in no acute distress   Neurological examination  Mentation: Alert oriented to time, place, history taking. Follows all commands speech and language fluent Cranial nerve II-XII: Pupils were equal round reactive to light. Extraocular movements were full, visual field were full on confrontational test. Facial sensation and strength were normal. Uvula tongue midline. Head turning and shoulder shrug  were normal and symmetric. Motor: The motor testing reveals 5 over 5 strength of all 4 extremities. Good symmetric motor tone is noted throughout.  Sensory: Sensory testing is intact to soft touch on all 4 extremities. No evidence of extinction is noted.  Coordination: Cerebellar testing reveals good finger-nose-finger and heel-to-shin bilaterally.  Gait and station: Gait is normal. Tandem gait is normal. Romberg is negative. No drift is seen.  Reflexes: Deep tendon reflexes are symmetric and normal bilaterally.   DIAGNOSTIC DATA (LABS, IMAGING, TESTING) - I reviewed patient records, labs, notes, testing and imaging myself where available.  Lab Results  Component Value Date   WBC 6.4 11/26/2016   HGB 13.9 11/26/2016   HCT 43.9 11/26/2016   MCV 98 (H) 11/26/2016   PLT 176 11/26/2016      Component Value Date/Time   NA 138 11/26/2016 1159   K 4.3 11/26/2016 1159   CL 97 11/26/2016 1159   CO2 25 11/26/2016 1159   GLUCOSE 196 (H) 11/26/2016 1159   GLUCOSE 203 (H) 08/21/2016 2307   BUN 9 11/26/2016 1159   CREATININE 0.54 (L) 11/26/2016 1159   CALCIUM 8.8 11/26/2016 1159   PROT 6.4 11/26/2016 1159   ALBUMIN 3.3 (L) 11/26/2016 1159   AST 33 11/26/2016 1159   ALT 32 11/26/2016 1159   ALKPHOS 102 11/26/2016 1159   BILITOT <0.2  11/26/2016 1159   GFRNONAA 111 11/26/2016 1159   GFRAA 128 11/26/2016 1159   Lab Results  Component Value Date   CHOL 136 10/17/2013   HDL 45 10/17/2013   LDLCALC 78 10/17/2013   TRIG 65 10/17/2013   Lab Results  Component Value Date   HGBA1C 5.1 01/30/2014   No results found for: VITAMINB12 Lab Results  Component Value Date   TSH 1.667 05/22/2014      ASSESSMENT AND PLAN 67 y.o. year old male  has a past medical history of Arthritis, Atrial fibrillation ([place]), Bowel perforation ([facility]), Deep vein thrombophlebitis of left leg ([facility]), Deep vein thrombosis of right lower extremity ([place]), Depression, DVT (deep venous thrombosis) ([place]), Hyperlipidemia, Hyperplasia of prostate, Obesity, Peripheral neuropathy, PFO (patent foramen ovale), Psoriasis, Seizures ([place]), Venous stasis, and Weakness of both legs. here with:  1.  Seizures 2.  Neuropathy   The patient will continue on Lamictal and Keppra.  If his seizure frequency increases they will let us know.  I discussed his medication with Dr. Brett Fairy.  She is amenable to increasing the hydrocodone to 2 tablets a day.  However since he is on gabapentin and Lyrica  we will wean him off of Lyrica.  He will decrease his dose to 50 mg 3 times a day for 1 week then 25 mg 3 times a day for 1 week.  Providing that there is no increase in his pain he will continue to decrease to 25 mg twice a day for 1 week then 25 mg daily and stop the medication.  He is advised that if his symptoms worsen or he develops new symptoms he should let us know.  He will follow-up in 6 months or sooner if needed.    Ward Givens, MSN, NP-C 06/02/2017, 11:15 AM [facility] [address] [place], [place] [address] [phone]

## 2017-06-02 NOTE — Telephone Encounter (Signed)
Called the patients sister and explained that Jinny Blossom had filled me in on what they discussed today about medication changes. Dr Dohmeier was fine with increasing the dosage of the hydrocodone. I have ordered that and will place this at the front for the patient's sister to pick up. I also informed the pt's sister that Ward Givens, NP will call and discuss how to wean Mr Lukas off of the Lyrica tomorrow. Pt's sister verbalized understanding.

## 2017-06-02 NOTE — Patient Instructions (Signed)
Your Plan:  Continue Lamictal and Keppra Will discuss with Dr. Brett Fairy about increasing Hydrocodone. If she is amendable then we will slowly wean off lyrica. If your symptoms worsen or you develop new symptoms please let us know.   Thank you for coming to see [place] at [facility]. I hope we have been able to provide you high quality care today.  You may receive a patient satisfaction survey over the next few weeks. We would appreciate your feedback and comments so that we may continue to improve ourselves and the health of our patients.

## 2017-06-03 ENCOUNTER — Encounter: Payer: Self-pay | Admitting: Adult Health

## 2017-06-03 MED ORDER — PREGABALIN 25 MG PO CAPS: ORAL_CAPSULE | ORAL | 0 refills | Status: DC

## 2017-06-03 NOTE — Progress Notes (Signed)
I agree with the assessment and plan as directed by NP .The patient is known to me .   Brettany Sydney, MD  

## 2017-06-03 NOTE — Telephone Encounter (Signed)
New Lyrica 25 mg [facility] [place]

## 2017-06-03 NOTE — Addendum Note (Signed)
Addended by: Trudie Buckler on: 06/03/2017 10:08 AM   Modules accepted: Orders

## 2017-06-03 NOTE — Telephone Encounter (Signed)
I called the sister.  He will wean off of Lyrica.  He is currently taking 75 mg 3 times a day.  I will call in the 25 mg tablet.  He will decrease his dose to 50 mg 3 times a day for 1 week.  He will decrease to 25 mg 3 times a day for 1 week.  He will then decrease his dose to 25 mg twice a day for 1 week.  He will then decrease to 1 tablet daily for 1 week and then discontinue the medication.  She voiced understanding.  She will call if his pain increases.

## 2017-06-16 ENCOUNTER — Other Ambulatory Visit: Payer: Self-pay | Admitting: Neurology

## 2017-06-25 ENCOUNTER — Other Ambulatory Visit: Payer: Self-pay | Admitting: *Deleted

## 2017-06-25 ENCOUNTER — Other Ambulatory Visit: Payer: Self-pay

## 2017-06-25 MED ORDER — DABIGATRAN ETEXILATE MESYLATE 150 MG PO CAPS: 150.0000 mg | ORAL_CAPSULE | Freq: Two times a day (BID) | ORAL | 1 refills | Status: DC

## 2017-07-13 ENCOUNTER — Ambulatory Visit (INDEPENDENT_AMBULATORY_CARE_PROVIDER_SITE_OTHER): Payer: Medicare Other | Admitting: Internal Medicine

## 2017-07-13 ENCOUNTER — Encounter: Payer: Self-pay | Admitting: Internal Medicine

## 2017-07-13 VITALS — BP 136/78 | HR 88 | Ht 77.0 in

## 2017-07-13 DIAGNOSIS — I480 Paroxysmal atrial fibrillation: Secondary | ICD-10-CM | POA: Diagnosis not present

## 2017-07-13 DIAGNOSIS — R600 Localized edema: Secondary | ICD-10-CM | POA: Diagnosis not present

## 2017-07-13 DIAGNOSIS — I878 Other specified disorders of veins: Secondary | ICD-10-CM | POA: Diagnosis not present

## 2017-07-13 DIAGNOSIS — Z86718 Personal history of other venous thrombosis and embolism: Secondary | ICD-10-CM | POA: Diagnosis not present

## 2017-07-13 MED ORDER — DABIGATRAN ETEXILATE MESYLATE 150 MG PO CAPS: 150.0000 mg | ORAL_CAPSULE | Freq: Two times a day (BID) | ORAL | 3 refills | Status: DC

## 2017-07-13 NOTE — Progress Notes (Signed)
OFFICE NOTE  Chief Complaint:  Leg swelling, pain  Primary Care Physician: Aura Dials, MD  HPI:  [name] is a 67 y.o. 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 He was apparently excessively sedated and he came obtunded requiring intubation for airway protection. He was also found to have a pneumonia. Earlier this morning he went into atrial fibrillation with rapid ventricular response. He is unaware of this. He has no history of A. fib that he is aware of. His mother did have a history of A. fib however she was very late in age with onset. He's denied any chest pain or worsening shortness of breath. Cardiology is asked to evaluate and recommend management for atrial fibrillation. Of note he is on chronic warfarin therapy for his DVT  [name] returns today for follow-up. He was seen in the office on a stretcher. He has difficulty getting to anticoagulation appointments at his primary care provider and they recommended follow-up in our office. I do not see if he has any contraindications to being on a direct oral anticoagulant. For some time he was on Xarelto, however he had a perforated viscus and ultimately had bleeding associated with that. He's very concerned about being on a medicine that cannot be reversed and is asking if there are any direct oral anticoagulant which could be reversed, as an alternative to warfarin.  [name] returns today for follow-up. He denies any chest pain or shortness of breath. He's had no bleeding problems on Pradaxa. The only concern about the medication as is cost. I offered alternatives, but they are hesitant to use those because they like the security of been able to reverse the possible  bleeding effects of the medication. His EKG today shows a sinus rhythm.  07/11/2016  [name] was seen today in follow-up. Unfortunately continues to be bedbound was in a stretcher in the room. He has paroxysmal atrial fibrillation but is in sinus today. He seems to be doing well on Pradaxa and he and his wife is comfortable about that since there is a reversal agent available. 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 He has significant bilateral venous stasis edema with exudate and crusting. He started to develop some woody stasis edema as well. He would significantly benefit from a wound care clinic referral.  07/13/2017  [name] returns today for follow-up.  He is again brought in on a stretcher.  He is not able to walk any great distances.  He has significant neuropathy and subsequent venous stasis.  He has a history of DVT and has been anticoagulated on Pradaxa.  That seems to be working well for him.  He has no recurrent atrial fibrillation he is aware of.  EKG today shows sinus rhythm at 88-personally reviewed.  PMHx:  Past Medical History:  Diagnosis Date  . Arthritis   . Atrial fibrillation ([place])    a. Dx 05/2014 in setting of seizure, PNA. S/p TEE/DCCV.  Marland Kitchen Bowel perforation ([place])    a. transvere and R colectomy and ileostomy in 12/25/13.  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 Deep vein thrombosis of right lower extremity ([place])   . Depression   .  DVT (deep venous thrombosis) ([facility])    multiple times  . Hyperlipidemia   . Hyperplasia of prostate   . Obesity   . Peripheral neuropathy   . PFO (patent foramen ovale)    a. 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  . Psoriasis   . Seizures ([place])   . Venous stasis   . Weakness of both legs     Past Surgical History:  Procedure Laterality Date  . ANKLE FRACTURE SURGERY  09/04/13  . CARDIOVERSION N/A 05/24/2014   Procedure:  CARDIOVERSION;  Surgeon: Josue Hector, MD;  Location: [facility] ENDOSCOPY;  Service: Cardiovascular;  Laterality: N/A;  . COLONOSCOPY  03/26/2012   Procedure: COLONOSCOPY;  Surgeon: Beryle Beams, MD;  Location: [facility] ENDOSCOPY;  Service: Endoscopy;  Laterality: N/A;  . COLOSTOMY  12-25-13   Ruptured colon  . FLEXIBLE SIGMOIDOSCOPY N/A 12/09/2013   Procedure: FLEXIBLE SIGMOIDOSCOPY;  Surgeon: Beryle Beams, MD;  Location: [facility] ENDOSCOPY;  Service: Endoscopy;  Laterality: N/A;  . LAPAROTOMY N/A 12/25/2013   Procedure: EXPLORATORY LAPAROTOMY/Transverse and right coloectomy/ end  ileostomy and mucus  fistula;  Surgeon: Ralene Ok, MD;  Location: [facility];  Service: General;  Laterality: N/A;  . TEE WITHOUT CARDIOVERSION N/A 05/24/2014   Procedure: TRANSESOPHAGEAL ECHOCARDIOGRAM (TEE);  Surgeon: Josue Hector, MD;  Location: [facility] ENDOSCOPY;  Service: Cardiovascular;  Laterality: N/A;  . TONSILLECTOMY     as child    FAMHx:  Family History  Problem Relation Age of Onset  . Vision loss Father   . Hypertension Father   . Diabetes Father   . Heart disease Father   . Cancer Father        colon  . Diabetes Mother   . Hypertension Mother   . Heart disease Mother   . Dementia Mother   . Cancer Mother        breast ca  . Diabetes Sister   . Hyperlipidemia Sister   . Hypertension Sister   . Diabetes Brother   . Kidney disease Brother   . Heart disease Brother     SOCHx:   reports that he quit smoking about 3 years ago. His smoking use included pipe. he has never used smokeless tobacco. He reports that he does not drink alcohol or use drugs.  ALLERGIES:  Allergies  Allergen Reactions  . Penicillins Other (See Comments)    Pt has no personal reaction to this med, but his mother/brother do. Has patient had a PCN reaction causing immediate rash, facial/tongue/throat swelling, SOB or lightheadedness with hypotension: No Has patient had a PCN reaction causing severe rash involving mucus membranes or  skin necrosis: No Has patient had a PCN reaction that required hospitalization No Has patient had a PCN reaction occurring within the last 10 years: No If all of the above answers are "NO", then may proceed with Cephalosporin    ROS: Pertinent items noted in HPI and remainder of comprehensive ROS otherwise negative.  HOME MEDS: Current Outpatient Medications  Medication Sig Dispense Refill  . acetaminophen (TYLENOL) 500 MG tablet Take 500-1,000 mg by mouth every 6 (six) hours as needed for mild pain, moderate pain, fever or headache.    . ALPHA LIPOIC ACID PO Take 1 capsule by mouth daily.    . Calcium Carbonate-Vitamin D (CALCIUM 600+D) 600-400 MG-UNIT tablet Take 1 tablet by mouth daily.    . dabigatran (PRADAXA) 150 MG CAPS capsule Take 1 capsule (150 mg total) by  mouth 2 (two) times daily. 180 capsule 3  . Dapsone 5 % topical gel Apply topically. Apply daily to wound    . diclofenac sodium (VOLTAREN) 1 % GEL Apply 4 g topically 4 (four) times daily. 100 g 0  . diltiazem (CARDIZEM CD) 240 MG 24 hr capsule Take 240 mg by mouth daily.    Marland Kitchen doxepin (SINEQUAN) 75 MG capsule Take 150 mg by mouth at bedtime.   5  . DULoxetine (CYMBALTA) 30 MG capsule Take 30 mg by mouth daily. Pt takes with a 60mg  capsule.    . DULoxetine (CYMBALTA) 60 MG capsule Take 60 mg by mouth daily. Pt takes with a 30mg  capsule.    . gabapentin (NEURONTIN) 600 MG tablet TAKE 1 TABLET BY MOUTH AT LUNCH AND 2 TABLETS AT DINNER TIME 270 tablet 3  . glipiZIDE (GLUCOTROL XL) 2.5 MG 24 hr tablet Take 2.5 mg by mouth daily.  0  . HYDROcodone-acetaminophen (NORCO/VICODIN) 5-325 MG tablet Take 1 tablet by mouth 2 (two) times daily as needed for moderate pain. 180 tablet 0  . lamoTRIgine (LAMICTAL) 150 MG tablet Take 1 tablet (150 mg total) by mouth 2 (two) times daily. 180 tablet 3  . levETIRAcetam (KEPPRA) 750 MG tablet TAKE 2 TABLETS BY MOUTH TWICE A DAY 120 tablet 5  . LORazepam (ATIVAN) 1 MG tablet Use 1 mg tab to break  focal seizure activity. As needed, do not exceed 2 mg a day. 30 tablet 0  . losartan (COZAAR) 50 MG tablet Take 25 mg by mouth 2 (two) times daily.  1  . metFORMIN (GLUCOPHAGE-XR) 500 MG 24 hr tablet 500 mg 2 (two) times daily.  1  . Multiple Vitamin (MULTIVITAMIN WITH MINERALS) TABS Take 1 tablet by mouth daily.    . vitamin B-12 (CYANOCOBALAMIN) 1000 MCG tablet Take 1,000 mcg by mouth daily.      No current facility-administered medications for this visit.     LABS/IMAGING: No results found for this or any previous visit (from the past 48 hour(s)). No results found.  WEIGHTS: Wt Readings from Last 3 Encounters:  06/14/15 300 lb (136.1 kg)  04/15/15 300 lb (136.1 kg)  10/24/14 280 lb (127 kg)    VITALS: BP 136/78   Pulse 88   Ht 6\' 5"  (1.956 m)   BMI 35.57 kg/m   EXAM: General appearance: alert, no distress and Lying supine on a stretcher Neck: no carotid bruit, no JVD and thyroid not enlarged, symmetric, no tenderness/mass/nodules Lungs: clear to auscultation bilaterally Heart: regular rate and rhythm, S1, S2 normal, no murmur, click, rub or gallop Abdomen: soft, non-tender; bowel sounds normal; no masses,  no organomegaly Extremities: extremities normal, atraumatic, no cyanosis or edema Pulses: 2+ and symmetric Skin: Skin color, texture, turgor normal. No rashes or lesions Neurologic: Grossly normal Psych: Pleasant  EKG: Sinus rhythm at 88, pulmonary disease pattern-personally reviewed  ASSESSMENT: 1. PAF-maintaining sinus, CHADSVASC score of 3 2. History of DVT on Pradaxa 3. Nonambulatory 4. Dyslipidemia 5. Severe bilateral lower extremity venous stasis edema-weeping  PLAN: 1.   [name] continues to have no recurrence of atrial fibrillation.  He has been on Pradaxa for anticoagulation which is tolerated.  He has significant bilateral lower extremity venous stasis and neuropathy which have been challenging for most of the neurologist.  From a cardiac standpoint  he has no other significant symptoms.  Follow-up annually or sooner as necessary.  Pixie Casino, MD, [facility], [place]  Medical Director of the [facility] Diplomate of the American Board of Clinical Lipidology Attending Cardiologist  Direct Dial: [phone]  Fax: [phone]  Website:  [email or web address] 07/13/2017, 5:00 PM

## 2017-07-13 NOTE — Patient Instructions (Signed)
Your physician wants you to follow-up in: ONE YEAR with Dr. Hilty. You will receive a reminder letter in the mail two months in advance. If you don't receive a letter, please call our office to schedule the follow-up appointment.  

## 2017-09-03 ENCOUNTER — Other Ambulatory Visit: Payer: Self-pay | Admitting: Neurology

## 2017-09-03 ENCOUNTER — Encounter: Payer: Self-pay | Admitting: Neurology

## 2017-09-03 MED ORDER — HYDROCODONE-ACETAMINOPHEN 5-325 MG PO TABS: 1.0000 | ORAL_TABLET | Freq: Two times a day (BID) | ORAL | 0 refills | Status: DC | PRN

## 2017-09-03 NOTE — Telephone Encounter (Signed)
Pt is due for a refill on hydrocodone. Sent to [facility] on Dr. Edwena Felty behalf.

## 2017-09-08 ENCOUNTER — Telehealth: Payer: Self-pay | Admitting: Neurology

## 2017-09-08 NOTE — Telephone Encounter (Signed)
PA completed through [employer] on cover my meds. Can take up to 72 hours before I hear back. KEY: [id number]

## 2017-09-17 ENCOUNTER — Encounter: Payer: Self-pay | Admitting: Neurology

## 2017-11-07 ENCOUNTER — Other Ambulatory Visit: Payer: Self-pay | Admitting: Neurology

## 2017-12-01 ENCOUNTER — Ambulatory Visit (INDEPENDENT_AMBULATORY_CARE_PROVIDER_SITE_OTHER): Payer: Medicare Other | Admitting: Neurology

## 2017-12-01 ENCOUNTER — Encounter: Payer: Self-pay | Admitting: Neurology

## 2017-12-01 VITALS — BP 138/82 | HR 96 | Ht 77.0 in

## 2017-12-01 DIAGNOSIS — G40909 Epilepsy, unspecified, not intractable, without status epilepticus: Secondary | ICD-10-CM | POA: Diagnosis not present

## 2017-12-01 DIAGNOSIS — G608 Other hereditary and idiopathic neuropathies: Secondary | ICD-10-CM

## 2017-12-01 MED ORDER — HYDROCODONE-ACETAMINOPHEN 5-325 MG PO TABS: ORAL_TABLET | ORAL | 0 refills | Status: DC

## 2017-12-01 MED ORDER — LAMOTRIGINE 150 MG PO TABS: 150.0000 mg | ORAL_TABLET | Freq: Two times a day (BID) | ORAL | 3 refills | Status: DC

## 2017-12-01 MED ORDER — LEVETIRACETAM 750 MG PO TABS: 1500.0000 mg | ORAL_TABLET | Freq: Two times a day (BID) | ORAL | 3 refills | Status: DC

## 2017-12-01 NOTE — Progress Notes (Addendum)
PATIENT: [name] DOB: 1951-03-12  REASON FOR VISIT: follow up HISTORY FROM: patient and sister   HISTORY OF PRESENT ILLNESS: Today 12/01/17   CD-Mr [name] is a 67 year old male patient with a progressive neuropathy that left him unable to ambulate and now is bedridden. He has seizures- and none recently. Possibly one absence seizure 6 month ago. He has a lot of re activated psoriasis. Hydrocodone helped his pain and jerkiness- he no longer takes Lyrica, and he now takes OTC supplements. He uses Magnilife - a magnesium based supplement that helps his constipation as well. He has a Teacher, music.  [name] neuropathy and is becoming dependent on his stretcher followed by an abdominal surgery actually a peritonitis is ruptured colon.  My colleagues at [facility] were kind enough to evaluate Mr. Careaga and thought that this reflects a Dilantin induced polyneuropathy but this should actually improve and the patient now has not been on Dilantin for about 12 months and there has been no improvement noted neither and motor no sensory function. 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    MM- [name] is a 67 year old male with a history of seizures and neuropathy .  He returns today for an evaluation.  His sister is with him today.  She reports that he may have an Absence seizures every 3-4 months.  She states that he just zones out when they are talking to him.  He remains on Lamictal and Keppra and is tolerating that well.  He is now off of Dilantin.  He continues to have neuropathy pain particularly in the groin area.  The patient is bedridden.  He is unable to sit up without significant discomfort.  He reports that Cymbalta helps him with his neuropathy does not help much with the pain.  He is also on gabapentin and Lyrica.  Lyrica is prescribed by another provider.  He is unsure that these medications are offering him much benefit.  At the last visit  Dr. Brett Fairy gave him a prescription of hydrocodone and he reports that this is offered him the most benefit.   He is requesting to take the medication twice a day versus once a day.  He returns today for an evaluation. HISTORY see [name] today, a 67 year old Caucasian gentleman with advanced muscular degeneration, he is presenting on a stretcher. He as years ago lost all his deep tendon reflexes, has often pain at the joint especially in the lower extremities at the ankle on the right for example. Muscle atrophy has caused him to have flaccid muscles. Deep tendon reflexes also missing from the upper extremities. An appointment with neuromuscular consultant is requested - this patient has no diagnosis for the cause of his disability.  Begun in lower extremities , first cane than walker , 2012 . Since than progressed to stretcher. Has a now a colostomy, has no core strength, has some residual foot and arm movement. DVTs from immobility.    He continues to present also as a seizure patient who also presents with a seizure disorder, manifesting as absence seizures, not GTC. He has been on Dilantin for a long time but my goal is to wean him slowly off and exchange with another seizure medication. He also presents today with a really high blood pressure.The patient had been started on Lamictal 100 mg twice a day and his sister confirms that his spells seem to have decreased in frequency. He remains on Keppra,  which I will also decrease to 1500 mg bid from tid dosing.  I have increased his Lamictal today 250 mg twice a day and I am starting to wean him off Dilantin 30 days on 200 mg, followed by another 30 days on 100 mg at night only then discontinue. The goal is to eliminate Dilantin as a contributor to neuropathy. He will have blood drawn today for CMET, CBC,  dilantin level .  The pain specialist added that he had nothing to offer above or beyond what she is already on.  He has followed up  with several pain specialists  was prescribed hydrocodone APAP and this helps him at night with the pain - this far the very best.  Since his sister manages the pain medication for him I think that the occasional hydrocodone APAP at night would out weigh the risks associated with the patient ( he is immobile, has a history of somewhat decreased respirations, morbid obesity, sleep apnea) but is in believable pain. .   Interval history on 07-05-14. [name] was admitted in status epilepticus on 05/17/2014 to the [facility] and followed by Dr. Cruzita Lederer,  He underwent multiple multiple brain scans which did not show an intracranial abnormality it was also noted that he was unable to swallow any medications during his status and therefore received Dilantin and Keppra intravenously. He was intubated for a couple of days. The status epilepticus was broken by being given IV medications. He is back on Dilantin and I have increased that in his last visit to 400 mg at night instead of alternating 300 with 400 we will resume this today. I refill his Keppra today as well as his Neurontin which I further increased by 600 mg. Neurontin also an antiepileptic medication is used in his case to treat his significant sensory abnormalities related to a neuropathy. What is remarkable to me is that the patient has almost no control over his lower extremities and lost all muscle tone by his upper extremities provide good grip strength and normal movement and tone. A nerve conduction study and EMG with Dr. Jannifer Franklin just documented neuropathy. The admission to the hospital was precipitated by developing pneumonia. The CT scan of the head was reviewed and the report is copied to today's note. I have discussed with the patient today that I would like for him to undergo a thoracic spine MRI if possible his last brain MRI by my records is from 2004, ordered by Dr. Jannifer Franklin at the time. In addition I do not need a brain MRI at this  point I think that it is clear that the pneumonia cost him to have the seizures which is not an unknown or uncommon trigger. Any infection below the seizure threshold. What I would like to research further why his lower extremities are disproportionately affected by a neuropathy.  Interval history from 10-03-14. She no longer has physical therapy provided through [facility] payment, he and his sister are using some weight exercises at home. He has also healed from this macerated skin near the colostomy,. A honey-based product has been helpful in establishing better nor skin. Dr. Cornelious Bryant thousand patient and exchanged his nocturnal time dose of gabapentin with doxepin. This helped him to sleep. Does not help the pain. He also has a rectal pain pulled endorsed neuropathy. I would like for Dr. Cornelious Bryant to have another visit with the patient also wondered if she couldperform any kind of study on the pudendal nerve?.CD  Interval history from  10/02/2015, Mr. Niblack has not had any significant seizure activity over the last 12 months at least. His main problem is that due to his immobility and venostasis he has painful neuropathy, venostasis and leg ulcers.     REVIEW OF SYSTEMS: Out of a complete 14 system review of symptoms, the patient complains only of the following symptoms, and all other reviewed systems are negative.  ALLERGIES: Allergies  Allergen Reactions  . Penicillins Other (See Comments)    Pt has no personal reaction to this med, but his mother/brother do. Has patient had a PCN reaction causing immediate rash, facial/tongue/throat swelling, SOB or lightheadedness with hypotension: No Has patient had a PCN reaction causing severe rash involving mucus membranes or skin necrosis: No Has patient had a PCN reaction that required hospitalization No Has patient had a PCN reaction occurring within the last 10 years: No If all of the above answers are "NO", then may proceed with Cephalosporin    HOME  MEDICATIONS: Outpatient Medications Prior to Visit  Medication Sig Dispense Refill  . acetaminophen (TYLENOL) 500 MG tablet Take 500-1,000 mg by mouth every 6 (six) hours as needed for mild pain, moderate pain, fever or headache.    . ALPHA LIPOIC ACID PO Take 1 capsule by mouth daily.    . Calcium Carbonate-Vitamin D (CALCIUM 600+D) 600-400 MG-UNIT tablet Take 1 tablet by mouth daily.    . dabigatran (PRADAXA) 150 MG CAPS capsule Take 1 capsule (150 mg total) by mouth 2 (two) times daily. 180 capsule 3  . Dapsone 5 % topical gel Apply topically. Apply daily to wound    . diltiazem (CARDIZEM CD) 240 MG 24 hr capsule Take 240 mg by mouth daily.    Marland Kitchen doxepin (SINEQUAN) 75 MG capsule Take 150 mg by mouth at bedtime.   5  . DULoxetine (CYMBALTA) 30 MG capsule Take 30 mg by mouth daily. Pt takes with a 60mg  capsule.    . DULoxetine (CYMBALTA) 60 MG capsule Take 60 mg by mouth daily. Pt takes with a 30mg  capsule.    . gabapentin (NEURONTIN) 600 MG tablet TAKE 1 TABLET BY MOUTH AT LUNCH AND 2 TABLETS AT DINNER TIME 270 tablet 3  . glipiZIDE (GLUCOTROL XL) 2.5 MG 24 hr tablet Take 2.5 mg by mouth daily.  0  . HYDROcodone-acetaminophen (NORCO/VICODIN) 5-325 MG tablet Take 1 tablet by mouth 2 (two) times daily as needed for moderate pain. 180 tablet 0  . lamoTRIgine (LAMICTAL) 150 MG tablet TAKE 1 TABLET BY MOUTH TWICE A DAY 180 tablet 3  . levETIRAcetam (KEPPRA) 750 MG tablet TAKE 2 TABLETS BY MOUTH TWICE A DAY 120 tablet 5  . LORazepam (ATIVAN) 1 MG tablet Use 1 mg tab to break focal seizure activity. As needed, do not exceed 2 mg a day. 30 tablet 0  . losartan (COZAAR) 50 MG tablet Take 25 mg by mouth 2 (two) times daily.  1  . metFORMIN (GLUCOPHAGE-XR) 500 MG 24 hr tablet 500 mg 2 (two) times daily.  1  . Multiple Vitamin (MULTIVITAMIN WITH MINERALS) TABS Take 1 tablet by mouth daily.    . vitamin B-12 (CYANOCOBALAMIN) 1000 MCG tablet Take 1,000 mcg by mouth daily.     . diclofenac sodium (VOLTAREN)  1 % GEL Apply 4 g topically 4 (four) times daily. 100 g 0   No facility-administered medications prior to visit.     PAST MEDICAL HISTORY: Past Medical History:  Diagnosis Date  . Arthritis   . Atrial fibrillation (  [place])    a. Dx 05/2014 in setting of seizure, PNA. S/p TEE/DCCV.  Marland Kitchen Bowel perforation ([place])    a. transvere and R colectomy and ileostomy in 12/25/13.  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 Deep vein thrombosis of right lower extremity ([place])   . Depression   . DVT (deep venous thrombosis) ([facility])    multiple times  . Hyperlipidemia   . Hyperplasia of prostate   . Obesity   . Peripheral neuropathy   . PFO (patent foramen ovale)    a. 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  . Psoriasis   . Seizures ([place])   . Venous stasis   . Weakness of both legs     PAST SURGICAL HISTORY: Past Surgical History:  Procedure Laterality Date  . ANKLE FRACTURE SURGERY  09/04/13  . CARDIOVERSION N/A 05/24/2014   Procedure: CARDIOVERSION;  Surgeon: Josue Hector, MD;  Location: [facility] ENDOSCOPY;  Service: Cardiovascular;  Laterality: N/A;  . COLONOSCOPY  03/26/2012   Procedure: COLONOSCOPY;  Surgeon: Beryle Beams, MD;  Location: [facility] ENDOSCOPY;  Service: Endoscopy;  Laterality: N/A;  . COLOSTOMY  12-25-13   Ruptured colon  . FLEXIBLE SIGMOIDOSCOPY N/A 12/09/2013   Procedure: FLEXIBLE SIGMOIDOSCOPY;  Surgeon: Beryle Beams, MD;  Location: [facility] ENDOSCOPY;  Service: Endoscopy;  Laterality: N/A;  . LAPAROTOMY N/A 12/25/2013   Procedure: EXPLORATORY LAPAROTOMY/Transverse and right coloectomy/ end  ileostomy and mucus  fistula;  Surgeon: Ralene Ok, MD;  Location: [place];  Service: General;  Laterality: N/A;  . TEE WITHOUT CARDIOVERSION N/A 05/24/2014   Procedure: TRANSESOPHAGEAL ECHOCARDIOGRAM (TEE);  Surgeon: Josue Hector, MD;  Location: [facility] ENDOSCOPY;  Service: Cardiovascular;  Laterality: N/A;  . TONSILLECTOMY     as child    FAMILY  HISTORY: Family History  Problem Relation Age of Onset  . Vision loss Father   . Hypertension Father   . Diabetes Father   . Heart disease Father   . Cancer Father        colon  . Diabetes Mother   . Hypertension Mother   . Heart disease Mother   . Dementia Mother   . Cancer Mother        breast ca  . Diabetes Sister   . Hyperlipidemia Sister   . Hypertension Sister   . Diabetes Brother   . Kidney disease Brother   . Heart disease Brother     SOCIAL HISTORY: Social History   Socioeconomic History  . Marital status: Single    Spouse name: Not on file  . Number of children: 0  . Years of education: HS  . Highest education level: Not on file  Occupational History  . Not on file  Social Needs  . Financial resource strain: Not on file  . Food insecurity:    Worry: Not on file    Inability: Not on file  . Transportation needs:    Medical: Not on file    Non-medical: Not on file  Tobacco Use  . Smoking status: Former Smoker    Types: Pipe    Last attempt to quit: 08/10/2013    Years since quitting: 4.3  . Smokeless tobacco: Never Used  Substance and Sexual Activity  . Alcohol use: No    Alcohol/week: 0.0 oz  . Drug use: No  . Sexual activity: Not on file  Lifestyle  . Physical activity:    Days per week: Not on  file    Minutes per session: Not on file  . Stress: Not on file  Relationships  . Social connections:    Talks on phone: Not on file    Gets together: Not on file    Attends religious service: Not on file    Active member of club or organization: Not on file    Attends meetings of clubs or organizations: Not on file    Relationship status: Not on file  . Intimate partner violence:    Fear of current or ex partner: Not on file    Emotionally abused: Not on file    Physically abused: Not on file    Forced sexual activity: Not on file  Other Topics Concern  . Not on file  Social History Narrative   Patient is single and lives with his sister.    Patient is retired from [employer].   Patient has a high school education.   Patient drinks one cup of caffeine daily.   Patient is right-handed.         PHYSICAL EXAM  Vitals:   12/01/17 1320  BP: 138/82  Pulse: 96  Height: 6\' 5"  (1.956 m)   Body mass index is 35.57 kg/m.    DIAGNOSTIC DATA (LABS, IMAGING, TESTING) - I reviewed patient records, labs, notes, testing and imaging myself where available.  Lab Results  Component Value Date   WBC 6.4 11/26/2016   HGB 13.9 11/26/2016   HCT 43.9 11/26/2016   MCV 98 (H) 11/26/2016   PLT 176 11/26/2016      Component Value Date/Time   NA 138 11/26/2016 1159   K 4.3 11/26/2016 1159   CL 97 11/26/2016 1159   CO2 25 11/26/2016 1159   GLUCOSE 196 (H) 11/26/2016 1159   GLUCOSE 203 (H) 08/21/2016 2307   BUN 9 11/26/2016 1159   CREATININE 0.54 (L) 11/26/2016 1159   CALCIUM 8.8 11/26/2016 1159   PROT 6.4 11/26/2016 1159   ALBUMIN 3.3 (L) 11/26/2016 1159   AST 33 11/26/2016 1159   ALT 32 11/26/2016 1159   ALKPHOS 102 11/26/2016 1159   BILITOT <0.2 11/26/2016 1159   GFRNONAA 111 11/26/2016 1159   GFRAA 128 11/26/2016 1159   Lab Results  Component Value Date   CHOL 136 10/17/2013   HDL 45 10/17/2013   LDLCALC 78 10/17/2013   TRIG 65 10/17/2013   Lab Results  Component Value Date   HGBA1C 5.1 01/30/2014   No results found for: [id number] Lab Results  Component Value Date   TSH 1.667 05/22/2014   There is no weight on file to calculate BMI.  Generalized: Well developed, in no acute distress   Neurological examination  Mentation: Alert oriented to time, place, history taking. Follows all commands speech and language fluent Cranial nerve : Sense of taste and smell intact.  Pupils were equal round reactive to light. Extraocular movements were full, visual field were full on confrontational test. Facial sensation and strength were normal. Uvula tongue midline. Head turning and shoulder shrug  were normal and  symmetric. Motor: Normal  strength in the upper extremity and wrist. Grip is preserved . He cannot sit up.    No strength in feet rotation, pushing, knee flexion and hip rotation- 2-5 . No antigravity. He was able to plantarflex and extend his feet. Not lift them off the stretcher.   Sensory: Sensory testing is intact to soft touch on the upper extremities only.  Pinprick sensation decreased to absent  in the lower extremity is in a stocking-like pattern.  UNable as usual to perform heel to shin.  Gait and station:  He arrived today on a stretcher.     ASSESSMENT AND PLAN;  1.  Seizures on keppra and lamictal  , gabapentin.  2.  Neuropathy- and significant sensory loss. Has pain and twitching , which responded to opiods.  3 .Atrial fib with chronic anticoagulation 4. Psoriasis.  Has spread to elbow and shoulder -  5. Multiple skin lesions. Blisters, discoloration.   The patient will continue on Lamictal and Keppra.  If his seizure frequency increases they will let us know.   I discussed his medication with Mrs. Bufano-   She is amenable to increasing the hydrocodone to 2 tablets a day.     [name], MD  12/01/2017, 1:48 PM [facility] [address] [place], [place] [address] [phone]

## 2017-12-01 NOTE — Patient Instructions (Signed)
Acetaminophen; Hydrocodone tablets or capsules What is this medicine? ACETAMINOPHEN; HYDROCODONE (a set a MEE noe fen; hye droe KOE done) is a pain reliever. It is used to treat moderate to severe pain. This medicine may be used for other purposes; ask your health care provider or pharmacist if you have questions. COMMON BRAND NAME(S): Anexsia, Bancap HC, Ceta-Plus, Co-Gesic, Comfortpak, Dolagesic, [employer], DuoCet, Hydrocet, Hydrogesic, [place], Lorcet HD, Lorcet Plus, Lortab, Margesic H, Maxidone, [place], Polygesic, [place], [place], [place], Vicodin, Vicodin ES, Vicodin HP, Charlane Ferretti What should I tell my health care provider before I take this medicine? They need to know if you have any of these conditions: -brain tumor -Crohn's disease, inflammatory bowel disease, or ulcerative colitis -drug abuse or addiction -head injury -heart or circulation problems -if you often drink alcohol -kidney disease or problems going to the bathroom -liver disease -lung disease, asthma, or breathing problems -an unusual or allergic reaction to acetaminophen, hydrocodone, other opioid analgesics, other medicines, foods, dyes, or preservatives -pregnant or trying to get pregnant -breast-feeding How should I use this medicine? Take this medicine by mouth with a glass of water. Follow the directions on the prescription label. You can take it with or without food. If it upsets your stomach, take it with food. Do not take your medicine more often than directed. A special MedGuide will be given to you by the pharmacist with each prescription and refill. Be sure to read this information carefully each time. Talk to your pediatrician regarding the use of this medicine in children. Special care may be needed. Overdosage: If you think you have taken too much of this medicine contact a poison control center or emergency room at once. NOTE: This medicine is only for you. Do not share this medicine with  others. What if I miss a dose? If you miss a dose, take it as soon as you can. If it is almost time for your next dose, take only that dose. Do not take double or extra doses. What may interact with this medicine? This medicine may interact with the following medications: -alcohol -antiviral medicines for HIV or AIDS -atropine -antihistamines for allergy, cough and cold -certain antibiotics like erythromycin, clarithromycin -certain medicines for anxiety or sleep -certain medicines for bladder problems like oxybutynin, tolterodine -certain medicines for depression like amitriptyline, fluoxetine, sertraline -certain medicines for fungal infections like ketoconazole and itraconazole -certain medicines for Parkinson's disease like benztropine, trihexyphenidyl -certain medicines for seizures like carbamazepine, phenobarbital, phenytoin, primidone -certain medicines for stomach problems like dicyclomine, hyoscyamine -certain medicines for travel sickness like scopolamine -general anesthetics like halothane, isoflurane, methoxyflurane, propofol -ipratropium -local anesthetics like lidocaine, pramoxine, tetracaine -MAOIs like Carbex, Eldepryl, Marplan, Nardil, and Parnate -medicines that relax muscles for surgery -other medicines with acetaminophen -other narcotic medicines for pain or cough -phenothiazines like chlorpromazine, mesoridazine, prochlorperazine, thioridazine -rifampin This list may not describe all possible interactions. Give your health care provider a list of all the medicines, herbs, non-prescription drugs, or dietary supplements you use. Also tell them if you smoke, drink alcohol, or use illegal drugs. Some items may interact with your medicine. What should I watch for while using this medicine? Tell your doctor or health care professional if your pain does not go away, if it gets worse, or if you have new or a different type of pain. You may develop tolerance to the medicine.  Tolerance means that you will need a higher dose of the medicine for pain relief. Tolerance is normal and is expected if  you take the medicine for a long time. Do not suddenly stop taking your medicine because you may develop a severe reaction. Your body becomes used to the medicine. This does NOT mean you are addicted. Addiction is a behavior related to getting and using a drug for a non-medical reason. If you have pain, you have a medical reason to take pain medicine. Your doctor will tell you how much medicine to take. If your doctor wants you to stop the medicine, the dose will be slowly lowered over time to avoid any side effects. There are different types of narcotic medicines (opiates). If you take more than one type at the same time or if you are taking another medicine that also causes drowsiness, you may have more side effects. Give your health care provider a list of all medicines you use. Your doctor will tell you how much medicine to take. Do not take more medicine than directed. Call emergency for help if you have problems breathing or unusual sleepiness. Do not take other medicines that contain acetaminophen with this medicine. Always read labels carefully. If you have questions, ask your doctor or pharmacist. If you take too much acetaminophen get medical help right away. Too much acetaminophen can be very dangerous and cause liver damage. Even if you do not have symptoms, it is important to get help right away. You may get drowsy or dizzy. Do not drive, use machinery, or do anything that needs mental alertness until you know how this medicine affects you. Do not stand or sit up quickly, especially if you are an older patient. This reduces the risk of dizzy or fainting spells. Alcohol may interfere with the effect of this medicine. Avoid alcoholic drinks. The medicine will cause constipation. Try to have a bowel movement at least every 2 to 3 days. If you do not have a bowel movement for 3  days, call your doctor or health care professional. Your mouth may get dry. Chewing sugarless gum or sucking hard candy, and drinking plenty of water may help. Contact your doctor if the problem does not go away or is severe. What side effects may I notice from receiving this medicine? Side effects that you should report to your doctor or health care professional as soon as possible: -allergic reactions like skin rash, itching or hives, swelling of the face, lips, or tongue -breathing problems -confusion -redness, blistering, peeling or loosening of the skin, including inside the mouth -signs and symptoms of low blood pressure like dizziness; feeling faint or lightheaded, falls; unusually weak or tired -trouble passing urine or change in the amount of urine -yellowing of the eyes or skin Side effects that usually do not require medical attention (report to your doctor or health care professional if they continue or are bothersome): -constipation -dry mouth -nausea, vomiting -tiredness This list may not describe all possible side effects. Call your doctor for medical advice about side effects. You may report side effects to FDA at 1-800-FDA-1088. Where should I keep my medicine? Keep out of the reach of children. This medicine can be abused. Keep your medicine in a safe place to protect it from theft. Do not share this medicine with anyone. Selling or giving away this medicine is dangerous and against the law. This medicine may cause accidental overdose and death if it taken by other adults, children, or pets. Mix any unused medicine with a substance like cat litter or coffee grounds. Then throw the medicine away in a sealed container like  a sealed bag or a coffee can with a lid. Do not use the medicine after the expiration date. Store at room temperature between 15 and 30 degrees C (59 and 86 degrees F). NOTE: This sheet is a summary. It may not cover all possible information. If you have  questions about this medicine, talk to your doctor, pharmacist, or health care provider.  2018 Elsevier/Gold Standard (2015-01-19 10:02:16)

## 2018-02-02 ENCOUNTER — Other Ambulatory Visit: Payer: Self-pay | Admitting: Dermatology

## 2018-03-04 ENCOUNTER — Other Ambulatory Visit: Payer: Self-pay | Admitting: Neurology

## 2018-03-04 ENCOUNTER — Telehealth: Payer: Self-pay | Admitting: Internal Medicine

## 2018-03-04 NOTE — Telephone Encounter (Signed)
Pradaxa has potential interaction with Verapamil but not with Diltiazem.  Patient should continue taking Pradaxa 150mg  twice daily (as prescribed), and continue taking diltiazem.   What kind of bleeding is he experiencing?

## 2018-03-04 NOTE — Telephone Encounter (Signed)
New message  Pt c/o medication issue:  1. Name of Medication:  dabigatran (PRADAXA) 150 MG CAPS capsule     2. How are you currently taking this medication (dosage and times per day)? 1 time daily  3. Are you having a reaction (difficulty breathing--STAT)? No   4. What is your medication issue? Pharmacy states that it interacts with Diltiazem and causes bleeding. Please call pharmacy to discuss.

## 2018-03-05 ENCOUNTER — Encounter: Payer: Self-pay | Admitting: Neurology

## 2018-03-05 ENCOUNTER — Other Ambulatory Visit: Payer: Self-pay | Admitting: Neurology

## 2018-03-05 MED ORDER — HYDROCODONE-ACETAMINOPHEN 5-325 MG PO TABS: ORAL_TABLET | ORAL | 0 refills | Status: DC

## 2018-03-05 NOTE — Telephone Encounter (Signed)
[name] at the pharmacy notified

## 2018-03-05 NOTE — Telephone Encounter (Signed)
Per [place] drug registry filled 12/07/17 #[id number] ordered by Dr. Brett Fairy. Last ov 12/01/17. Next appt 08/02/2018. Will send to Dr. Brett Fairy for refill.

## 2018-03-08 MED ORDER — HYDROCODONE-ACETAMINOPHEN 5-325 MG PO TABS: ORAL_TABLET | ORAL | 0 refills | Status: DC

## 2018-03-12 ENCOUNTER — Other Ambulatory Visit: Payer: Self-pay | Admitting: Neurology

## 2018-06-03 ENCOUNTER — Other Ambulatory Visit: Payer: Self-pay | Admitting: Neurology

## 2018-06-04 ENCOUNTER — Encounter (HOSPITAL_COMMUNITY): Payer: Self-pay | Admitting: Neurology

## 2018-06-04 ENCOUNTER — Encounter: Payer: Self-pay | Admitting: Neurology

## 2018-06-04 MED ORDER — HYDROCODONE-ACETAMINOPHEN 5-325 MG PO TABS: ORAL_TABLET | ORAL | 0 refills | Status: DC

## 2018-07-15 ENCOUNTER — Ambulatory Visit: Payer: Medicare Other | Admitting: Internal Medicine

## 2018-08-02 ENCOUNTER — Ambulatory Visit: Payer: Medicare Other | Admitting: Neurology

## 2018-08-03 ENCOUNTER — Other Ambulatory Visit: Payer: Self-pay | Admitting: Internal Medicine

## 2018-08-03 DIAGNOSIS — I480 Paroxysmal atrial fibrillation: Secondary | ICD-10-CM

## 2018-08-03 NOTE — Telephone Encounter (Signed)
68yo, 300 lbs, Scr 0.5 from 12/12/16 (note placed on hilty f/u appt to obtain labs Last OV 07/13/17 Crcl >217ml/min

## 2018-09-01 ENCOUNTER — Telehealth: Payer: Self-pay | Admitting: Internal Medicine

## 2018-09-01 ENCOUNTER — Other Ambulatory Visit: Payer: Self-pay | Admitting: Neurology

## 2018-09-01 MED ORDER — HYDROCODONE-ACETAMINOPHEN 5-325 MG PO TABS: ORAL_TABLET | ORAL | 0 refills | Status: DC

## 2018-09-01 NOTE — Telephone Encounter (Signed)
New Message:   Patient sister calling concering patient appt. They are ok having a Virtual appt. Please call patient sister at home number.

## 2018-09-03 ENCOUNTER — Telehealth: Payer: Self-pay

## 2018-09-03 NOTE — Telephone Encounter (Signed)

## 2018-09-06 NOTE — Telephone Encounter (Signed)
Appointment is scheduled for 5/28 at 2 pm with Dr. Debara Pickett.

## 2018-09-07 ENCOUNTER — Ambulatory Visit: Payer: Medicare Other | Admitting: Internal Medicine

## 2018-10-05 ENCOUNTER — Telehealth: Payer: Self-pay | Admitting: Internal Medicine

## 2018-10-05 NOTE — Telephone Encounter (Signed)
Mychart, smartphone, consent, pre reg complete 10/05/18 AF °

## 2018-10-07 ENCOUNTER — Encounter: Payer: Self-pay | Admitting: Internal Medicine

## 2018-10-07 ENCOUNTER — Telehealth (INDEPENDENT_AMBULATORY_CARE_PROVIDER_SITE_OTHER): Payer: Medicare Other | Admitting: Internal Medicine

## 2018-10-07 VITALS — BP 115/70 | HR 86

## 2018-10-07 DIAGNOSIS — R600 Localized edema: Secondary | ICD-10-CM

## 2018-10-07 DIAGNOSIS — Z7901 Long term (current) use of anticoagulants: Secondary | ICD-10-CM | POA: Diagnosis not present

## 2018-10-07 DIAGNOSIS — I878 Other specified disorders of veins: Secondary | ICD-10-CM

## 2018-10-07 DIAGNOSIS — I480 Paroxysmal atrial fibrillation: Secondary | ICD-10-CM

## 2018-10-07 DIAGNOSIS — E782 Mixed hyperlipidemia: Secondary | ICD-10-CM | POA: Diagnosis not present

## 2018-10-07 NOTE — Progress Notes (Signed)
Virtual Visit via Video Note   This visit type was conducted due to national recommendations for restrictions regarding the COVID-19 Pandemic (e.g. social distancing) in an effort to limit this patient's exposure and mitigate transmission in our community.  Due to his co-morbid illnesses, this patient is at least at moderate risk for complications without adequate follow up.  This format is felt to be most appropriate for this patient at this time.  All issues noted in this document were discussed and addressed.  A limited physical exam was performed with this format.  Please refer to the patient's chart for his consent to telehealth for [facility].   Evaluation Performed:  Doximity video visit  Date:  10/07/2018   ID:  [name], [name] 1950/10/09, MRN [id number]  Patient Location:  [place] [address]  Provider location:   [address], [place], [place] [address]  PCP:  System, Pcp Not In  Cardiologist:  No primary care provider on file. Electrophysiologist:  None   Chief Complaint:  Leg pain  History of Present Illness:    [name] is a 68 y.o. male who presents via audio/video conferencing for a telehealth visit today.  [name] was seen today via Doximity video visit. He is chronically bedbound due to neuropathy. He has had problems with chronic leg edema which has improved. Recently he has noted an irregular heartbeat and an EKG by a home health doctor seems to have confirmed afib - he has known PAF on Pradaxa. Labs stable recently - he was noted to have elevated triglycerides and changed to a fibrate and fish oil. He denies chest pain, DOE or symptomatic atrial fibrillation.  The patient does not have symptoms concerning for COVID-19 infection (fever, chills, cough, or new SHORTNESS OF BREATH).    Prior CV studies:   The following studies were reviewed today:  Chart review  PMHx:  Past Medical History:  Diagnosis Date   . Arthritis   . Atrial fibrillation ([place])    a. Dx 05/2014 in setting of seizure, PNA. S/p TEE/DCCV.  [name] Bowel perforation ([place])    a. transvere and R colectomy and ileostomy in 12/25/13.  [name] Deep vein thrombophlebitis of left leg ([facility])   . Deep vein thrombosis of right lower extremity ([place])   . Depression   . DVT (deep venous thrombosis) ([facility])    multiple times  . Hyperlipidemia   . Hyperplasia of prostate   . Obesity   . Peripheral neuropathy   . PFO (patent foramen ovale)    a. 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  . Psoriasis   . Seizures ([place])   . Venous stasis   . Weakness of both legs     Past Surgical History:  Procedure Laterality Date  . ANKLE FRACTURE SURGERY  09/04/13  . CARDIOVERSION N/A 05/24/2014   Procedure: CARDIOVERSION;  Surgeon: Josue Hector, MD;  [facility] END[facility]COPY;  Service: Cardiovascular;  Laterality: N/A;  . COLONOSCOPY  03/26/2012   Procedure: COLONOSCOPY;  Surgeon: Beryle Beams, MD;  Location: [facility] ENDOSCOPY;  Service: Endoscopy;  Laterality: N/A;  . COLOSTOMY  12-25-13   Ruptured colon  . FLEXIBLE SIGMOIDOSCOPY N/A 12/09/2013   Procedure: FLEXIBLE SIGMOIDOSCOPY;  Surgeon: Beryle Beams, MD;  Location: [facility] ENDOSCOPY;  Service: Endoscopy;  Laterality: N/A;  . LAPAROTOMY N/A 12/25/2013   Procedure: EXPLORATORY LAPAROTOMY/Transverse and right coloectomy/ end  ileostomy and mucus  fistula;  Surgeon: Ralene Ok, MD;  Location: [place];  Service: General;  Laterality: N/A;  . TEE WITHOUT CARDIOVERSION N/A 05/24/2014   Procedure: TRANSESOPHAGEAL ECHOCARDIOGRAM (TEE);  Surgeon: Josue Hector, MD;  Location: [facility] ENDOSCOPY;  Service: Cardiovascular;  Laterality: N/A;  . TONSILLECTOMY     as child    FAMHx:  Family History  Problem Relation Age of Onset  . Vision loss Father   . Hypertension Father   . Diabetes Father   . Heart disease Father   . Cancer Father        colon  . Diabetes Mother   .  Hypertension Mother   . Heart disease Mother   . Dementia Mother   . Cancer Mother        breast ca  . Diabetes Sister   . Hyperlipidemia Sister   . Hypertension Sister   . Diabetes Brother   . Kidney disease Brother   . Heart disease Brother     SOCHx:   reports that he quit smoking about 5 years ago. His smoking use included pipe. He has never used smokeless tobacco. He reports that he does not drink alcohol or use drugs.  ALLERGIES:  Allergies  Allergen Reactions  . Penicillins Other (See Comments)    Pt has no personal reaction to this med, but his mother/brother do. Has patient had a PCN reaction causing immediate rash, facial/tongue/throat swelling, SOB or lightheadedness with hypotension: No Has patient had a PCN reaction causing severe rash involving mucus membranes or skin necrosis: No Has patient had a PCN reaction that required hospitalization No Has patient had a PCN reaction occurring within the last 10 years: No If all of the above answers are "NO", then may proceed with Cephalosporin    MEDS:  Current Meds  Medication Sig  . acetaminophen (TYLENOL) 500 MG tablet Take 500-1,000 mg by mouth every 6 (six) hours as needed for mild pain, moderate pain, fever or headache.  . ALPHA LIPOIC ACID PO Take 1 capsule by mouth daily.  . Calcium Carbonate-Vitamin D (CALCIUM 600+D) 600-400 MG-UNIT tablet Take 1 tablet by mouth daily.  . Cholecalciferol (VITAMIN D3 PO) Take 1 capsule by mouth daily.  . Dapsone 5 % topical gel Apply topically. Apply daily to wound  . diltiazem (CARDIZEM CD) 240 MG 24 hr capsule Take 240 mg by mouth daily.  [name] doxepin (SINEQUAN) 75 MG capsule Take 150 mg by mouth at bedtime.   . DULoxetine (CYMBALTA) 30 MG capsule Take 30 mg by mouth daily. Pt takes with a 60mg  capsule.  . DULoxetine (CYMBALTA) 60 MG capsule Take 60 mg by mouth daily. Pt takes with a 30mg  capsule.  . fenofibrate 160 MG tablet Take 160 mg by mouth daily.  [name] gabapentin (NEURONTIN)  600 MG tablet TAKE 1 TABLET BY MOUTH AT LUNCH AND 2 TABLETS AT [facility] TIME  . glipiZIDE (GLUCOTROL XL) 2.5 MG 24 hr tablet Take 2.5 mg by mouth daily.  [name] HYDROcodone-acetaminophen (NORCO/VICODIN) 5-325 MG tablet Up to twice a day as needed.  . lamoTRIgine (LAMICTAL) 150 MG tablet Take 1 tablet (150 mg total) by mouth 2 (two) times daily.  [name] levETIRAcetam (KEPPRA) 750 MG tablet Take 1,500 mg by mouth 2 (two) times daily.  [name] LORazepam (ATIVAN) 1 MG tablet Use 1 mg tab to break focal seizure activity. As needed, do not exceed 2 mg a day.  . losartan (COZAAR) 50 MG tablet Take 25 mg by mouth 2 (two) times daily.  [name]  metFORMIN (GLUCOPHAGE-XR) 500 MG 24 hr tablet 500 mg 2 (two) times daily.  . Multiple Vitamin (MULTIVITAMIN WITH MINERALS) TABS Take 1 tablet by mouth daily.  . Omega-3 Fatty Acids (FISH OIL) 1000 MG CAPS Take 1,000 mg by mouth 2 (two) times a day.  [name] PRADAXA 150 MG CAPS capsule TAKE 1 CAPSULE BY MOUTH TWICE A DAY     ROS: Pertinent items noted in HPI and remainder of comprehensive ROS otherwise negative.  Labs/Other Tests and Data Reviewed:    Recent Labs: No results found for requested labs within last 8760 hours.   Recent Lipid Panel Lab Results  Component Value Date/Time   CHOL 136 10/17/2013   TRIG 65 10/17/2013   HDL 45 10/17/2013   LDLCALC 78 10/17/2013    Wt Readings from Last 3 Encounters:  06/14/15 300 lb (136.1 kg)  04/15/15 300 lb (136.1 kg)  10/24/14 280 lb (127 kg)     Exam:    Vital Signs:  BP 115/70   Pulse 86    General appearance: alert, no distress and moderately obese Lungs: no visual respiratory difficulty Extremities: edema 1+ bilateral stasis edema Skin: pale Neurologic: Grossly normal  ASSESSMENT & PLAN:    1. PAF-maintaining sinus, CHADSVASC score of 3 2. History of DVT on Pradaxa 3. Nonambulatory 4. Dyslipidemia 5. Bilateral lower extremity venous stasis edema  Mr. Skipper is doing well, but remains bedbound due to neuropathy. He  has had some recurrent afib recently, but is on Pradaxa and is asymptomatic with it. BP appears well-controlled. His triglycerides were elevated, however, he was placed on fibrate and fish oil - this is being managed by his visiting home physicians who will repeat bloodwork in 2 months.  COVID-19 Education: The signs and symptoms of COVID-19 were discussed with the patient and how to seek care for testing (follow up with PCP or arrange E-visit).  The importance of social distancing was discussed today.  Patient Risk:   After full review of this patients clinical status, I feel that they are at least moderate risk at this time.  Time:   Today, I have spent 25 minutes with the patient with telehealth technology discussing PAF, HTN, dyslipidemia, edema.     Medication Adjustments/Labs and Tests Ordered: Current medicines are reviewed at length with the patient today.  Concerns regarding medicines are outlined above.   Tests Ordered: No orders of the defined types were placed in this encounter.   Medication Changes: No orders of the defined types were placed in this encounter.   Disposition:  in 1 year(s)  Pixie Casino, MD, [facility], [place] Director of the Advanced Lipid Disorders &  Cardiovascular Risk Reduction Clinic Diplomate of the American Board of Clinical Lipidology Attending Cardiologist  Direct Dial: [phone]  Fax: [phone]  Website:  [email or web address]  Pixie Casino, MD  10/07/2018 2:53 PM

## 2018-10-07 NOTE — Patient Instructions (Signed)
Medication Instructions:  Your physician recommends that you continue on your current medications as directed. Please refer to the Current Medication list given to you today.  If you need a refill on your cardiac medications before your next appointment, please call your pharmacy.    Follow-Up: At [facility], you and your health needs are our priority.  As part of our continuing mission to provide you with exceptional heart care, we have created designated Provider Care Teams.  These Care Teams include your primary Cardiologist (physician) and Advanced Practice Providers (APPs -  Physician Assistants and Nurse Practitioners) who all work together to provide you with the care you need, when you need it. You will need a follow up appointment in 12 months.  Please call our office 2 months in advance to schedule this appointment.  You may see Dr. Hilty or one of the following Advanced Practice Providers on your designated Care Team: Hao Meng, PA-C . Angela Duke, PA-C    

## 2018-10-25 ENCOUNTER — Other Ambulatory Visit: Payer: Self-pay | Admitting: Internal Medicine

## 2018-10-25 DIAGNOSIS — I480 Paroxysmal atrial fibrillation: Secondary | ICD-10-CM

## 2018-11-01 ENCOUNTER — Telehealth: Payer: Self-pay | Admitting: Neurology

## 2018-11-01 ENCOUNTER — Encounter: Payer: Self-pay | Admitting: Neurology

## 2018-11-01 NOTE — Telephone Encounter (Signed)
Called the patient's sister and was able to inform her that Dr Brett Fairy will be out of the office on 11/10/2018. Advised that I had an opening on wed and we could complete the video visit through mychart. Our office is now providing the capability to offer the patients virtual visits at this time. Informed of what that process looks like and informed that the Virtual visit will still be billed through insurance as such. Due to [place],informed the patient since the appointment is taking place over the phone/internet app, we can't guarantee the security of the phone line. With that said if we do move forward I would have to get verbal consent to complete the Video Visit/Phone call. Patient's sister gave verbal consent to move forward with the video visit. I have reviewed the patient's chart and made sure that everything is up to date. Patient is also made aware that since this is a video visit we are able to complete the visit but a physical exam is not able to be done since the patient is not present in person. Pt's sister verbalized understanding of this information and will states to be ready for the visit at least 15-30 min prior to the visit. Reminded the patient once more that this is treated as a Office visit and the patient must be prepared for the visit and ready at the time of their appointment preferably in a well lit area where they have good connection for the visit. Pt verbalized understanding.

## 2018-11-03 ENCOUNTER — Telehealth (INDEPENDENT_AMBULATORY_CARE_PROVIDER_SITE_OTHER): Payer: Medicare Other | Admitting: Neurology

## 2018-11-03 ENCOUNTER — Encounter: Payer: Self-pay | Admitting: Neurology

## 2018-11-03 DIAGNOSIS — Q211 Atrial septal defect: Secondary | ICD-10-CM | POA: Diagnosis not present

## 2018-11-03 DIAGNOSIS — Z86718 Personal history of other venous thrombosis and embolism: Secondary | ICD-10-CM

## 2018-11-03 DIAGNOSIS — I4891 Unspecified atrial fibrillation: Secondary | ICD-10-CM | POA: Diagnosis not present

## 2018-11-03 DIAGNOSIS — Q2112 Patent foramen ovale: Secondary | ICD-10-CM

## 2018-11-03 MED ORDER — GABAPENTIN 600 MG PO TABS: ORAL_TABLET | ORAL | 3 refills | Status: DC

## 2018-11-03 MED ORDER — LEVETIRACETAM 750 MG PO TABS: 1500.0000 mg | ORAL_TABLET | Freq: Two times a day (BID) | ORAL | 3 refills | Status: DC

## 2018-11-03 MED ORDER — LAMOTRIGINE 150 MG PO TABS: 150.0000 mg | ORAL_TABLET | Freq: Two times a day (BID) | ORAL | 3 refills | Status: DC

## 2018-11-03 NOTE — Progress Notes (Signed)
Marland Kitchen    PATIENT: [name] DOB: 09/28/1950  REASON FOR VISIT: follow up HISTORY FROM: patient and sister    Virtual Visit via Video Note  I connected with [name] on 11/03/18 at  2:00 PM EDT by a video enabled telemedicine application and verified that I am speaking with the correct person using two identifiers.  Location: Patient: at his home with his sister Provider: [facility]   I discussed the limitations of evaluation and management by telemedicine and the availability of in person appointments. The patient expressed understanding and agreed to proceed.   Interval history from 11-03-2018: [name] has been a longtime established patient at [facility] and was originally followed by Dr. Jeneen Rinks love.  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  He suffers from paroxysmal atrial fibrillation, he is meanwhile immobile and he developed a seizure disorder which has been treated and controlled.  The patient lives in the private home with his sister who is his main caregiver.  For several years he did not have a primary care physician but now is established with Dr. Waymon Budge, MD at [facility] He underwent recently as a new patient to complete the work-up which included the level of his antiseizure medication including Keppra and Neurontin, vitamin levels and an EKG.  He remains having paroxysmal atrial fibrillation and sees Dr. Debara Pickett.  His Keppra level so I was informed was 24, he was told to hydrate better and drink a lot of water, he was also told to start a vitamin D supplement and discontinue vitamin B12.  The patient is well-groomed alert oriented and conversational today he keeps adjusting his bed when he is in pain trying to find a more comfortable position but overall his pain level has been the same for many years and his degree of disability has not progressed recently.  He is nonambulatory.  He does have just recovered from a  sinusitis and has a little nasal speech but he is not hoarse, and he does not have trouble swallowing he reports. There is no diplopia no facial asymmetry seen.      HISTORY OF PRESENT ILLNESS: [id number] Mr [name] is a 68 year old male patient with a progressive neuropathy that left him unable to ambulate and now is bedridden. He has seizures- and none recently. Possibly one absence seizure 6 month ago. He has a lot of re activated psoriasis. Hydrocodone helped his pain and jerkiness- he no longer takes Lyrica, and he now takes OTC supplements. He uses Magnilife - a magnesium based supplement that helps his constipation as well. He has a Teacher, music.  Mr. Dastrup neuropathy and is becoming dependent on his stretcher followed by an abdominal surgery actually a peritonitis is ruptured colon.  My colleagues at [facility] were kind enough to evaluate Mr. Gillingham and thought that this reflects a Dilantin induced polyneuropathy but this should actually improve and the patient now has not been on Dilantin for about 12 months and there has been no improvement noted neither and motor no sensory function. 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    MM- [name] is a 68 year old male with a history of seizures and neuropathy .  He returns today for an evaluation.  His sister is with him today.  She reports that he may have an Absence seizures every 3-4 months.  She states that he just zones out when they are talking to him.  He  remains on Lamictal and Keppra and is tolerating that well.  He is now off of Dilantin.  He continues to have neuropathy pain particularly in the groin area.  The patient is bedridden.  He is unable to sit up without significant discomfort.  He reports that Cymbalta helps him with his neuropathy does not help much with the pain.  He is also on gabapentin and Lyrica.  Lyrica is prescribed by another provider.  He is unsure that these medications are  offering him much benefit.  At the last visit Dr. Brett Fairy gave him a prescription of hydrocodone and he reports that this is offered him the most benefit.   He is requesting to take the medication twice a day versus once a day.  He returns today for an evaluation. HISTORY see [name] today, a 68 year old Caucasian gentleman with advanced muscular degeneration, he is presenting on a stretcher. He as years ago lost all his deep tendon reflexes, has often pain at the joint especially in the lower extremities at the ankle on the right for example. Muscle atrophy has caused him to have flaccid muscles. Deep tendon reflexes also missing from the upper extremities. An appointment with neuromuscular consultant is requested - this patient has no diagnosis for the cause of his disability.  Begun in lower extremities , first cane than walker , 2012 . Since than progressed to stretcher. Has a now a colostomy, has no core strength, has some residual foot and arm movement. DVTs from immobility.    He continues to present also as a seizure patient who also presents with a seizure disorder, manifesting as absence seizures, not GTC. He has been on Dilantin for a long time but my goal is to wean him slowly off and exchange with another seizure medication. He also presents today with a really high blood pressure.The patient had been started on Lamictal 100 mg twice a day and his sister confirms that his spells seem to have decreased in frequency. He remains on Keppra, which I will also decrease to 1500 mg bid from tid dosing.  I have increased his Lamictal today 250 mg twice a day and I am starting to wean him off Dilantin 30 days on 200 mg, followed by another 30 days on 100 mg at night only then discontinue. The goal is to eliminate Dilantin as a contributor to neuropathy. He will have blood drawn today for CMET, CBC,  dilantin level .  The pain specialist added that he had nothing to offer above or beyond  what she is already on.  He has followed up with several pain specialists  was prescribed hydrocodone APAP and this helps him at night with the pain - this far the very best.  Since his sister manages the pain medication for him I think that the occasional hydrocodone APAP at night would out weigh the risks associated with the patient ( he is immobile, has a history of somewhat decreased respirations, morbid obesity, sleep apnea) but is in believable pain. .   Interval history on 07-05-14. Mr. Sotomayor was admitted in status epilepticus on 05/17/2014 to the [facility] and followed by Dr. Cruzita Lederer,  He underwent multiple multiple brain scans which did not show an intracranial abnormality it was also noted that he was unable to swallow any medications during his status and therefore received Dilantin and Keppra intravenously. He was intubated for a couple of days. The status epilepticus was broken by being given IV medications. He  is back on Dilantin and I have increased that in his last visit to 400 mg at night instead of alternating 300 with 400 we will resume this today. I refill his Keppra today as well as his Neurontin which I further increased by 600 mg. Neurontin also an antiepileptic medication is used in his case to treat his significant sensory abnormalities related to a neuropathy. What is remarkable to me is that the patient has almost no control over his lower extremities and lost all muscle tone by his upper extremities provide good grip strength and normal movement and tone. A nerve conduction study and EMG with Dr. Jannifer Franklin just documented neuropathy. The admission to the hospital was precipitated by developing pneumonia. The CT scan of the head was reviewed and the report is copied to today's note. I have discussed with the patient today that I would like for him to undergo a thoracic spine MRI if possible his last brain MRI by my records is from 2004, ordered by Dr. Jannifer Franklin at the time. In  addition I do not need a brain MRI at this point I think that it is clear that the pneumonia cost him to have the seizures which is not an unknown or uncommon trigger. Any infection below the seizure threshold. What I would like to research further why his lower extremities are disproportionately affected by a neuropathy.  Interval history from 10-03-14. She no longer has physical therapy provided through [employer] payment, he and his sister are using some weight exercises at home. He has also healed from this macerated skin near the colostomy,. A honey-based product has been helpful in establishing better nor skin. Dr. Cornelious Bryant thousand patient and exchanged his nocturnal time dose of gabapentin with doxepin. This helped him to sleep. Does not help the pain. He also has a rectal pain pulled endorsed neuropathy. I would like for Dr. Cornelious Bryant to have another visit with the patient also wondered if she couldperform any kind of study on the pudendal nerve?.CD  Interval history from 10/02/2015, Mr. Stidham has not had any significant seizure activity over the last 12 months at least. His main problem is that due to his immobility and venostasis he has painful neuropathy, venostasis and leg ulcers.     REVIEW OF SYSTEMS: Out of a complete 14 system review of symptoms, the patient complains only of the following symptoms, and all other reviewed systems are negative.  ALLERGIES: Allergies  Allergen Reactions  . Penicillins Other (See Comments)    Pt has no personal reaction to this med, but his mother/brother do. Has patient had a PCN reaction causing immediate rash, facial/tongue/throat swelling, SOB or lightheadedness with hypotension: No Has patient had a PCN reaction causing severe rash involving mucus membranes or skin necrosis: No Has patient had a PCN reaction that required hospitalization No Has patient had a PCN reaction occurring within the last 10 years: No If all of the above answers are "NO",  then may proceed with Cephalosporin    HOME MEDICATIONS: Outpatient Medications Prior to Visit  Medication Sig Dispense Refill  . acetaminophen (TYLENOL) 500 MG tablet Take 500-1,000 mg by mouth every 6 (six) hours as needed for mild pain, moderate pain, fever or headache.    . ALPHA LIPOIC ACID PO Take 1 capsule by mouth daily.    . Calcium Carbonate-Vitamin D (CALCIUM 600+D) 600-400 MG-UNIT tablet Take 1 tablet by mouth daily.    . Cholecalciferol (VITAMIN D3 PO) Take 1 capsule by mouth daily.    Marland Kitchen  dabigatran (PRADAXA) 150 MG CAPS capsule Take 1 capsule (150 mg total) by mouth 2 (two) times daily. NEED OV. 180 capsule 0  . Dapsone 5 % topical gel Apply topically. Apply daily to wound    . diltiazem (CARDIZEM CD) 240 MG 24 hr capsule Take 240 mg by mouth daily.    Marland Kitchen doxepin (SINEQUAN) 75 MG capsule Take 150 mg by mouth at bedtime.   5  . DULoxetine (CYMBALTA) 30 MG capsule Take 30 mg by mouth daily. Pt takes with a 60mg  capsule.    . DULoxetine (CYMBALTA) 60 MG capsule Take 60 mg by mouth daily. Pt takes with a 30mg  capsule.    . fenofibrate 160 MG tablet Take 160 mg by mouth daily.    Marland Kitchen gabapentin (NEURONTIN) 600 MG tablet TAKE 1 TABLET BY MOUTH AT LUNCH AND 2 TABLETS AT DINNER TIME 270 tablet 3  . glipiZIDE (GLUCOTROL XL) 2.5 MG 24 hr tablet Take 2.5 mg by mouth daily.  0  . HYDROcodone-acetaminophen (NORCO/VICODIN) 5-325 MG tablet Up to twice a day as needed. 180 tablet 0  . lamoTRIgine (LAMICTAL) 150 MG tablet Take 1 tablet (150 mg total) by mouth 2 (two) times daily. 180 tablet 3  . levETIRAcetam (KEPPRA) 750 MG tablet Take 1,500 mg by mouth 2 (two) times daily.    Marland Kitchen LORazepam (ATIVAN) 1 MG tablet Use 1 mg tab to break focal seizure activity. As needed, do not exceed 2 mg a day. 30 tablet 0  . losartan (COZAAR) 25 MG tablet Take 25 mg by mouth 2 (two) times daily.   1  . metFORMIN (GLUCOPHAGE-XR) 500 MG 24 hr tablet 500 mg 2 (two) times daily.  1  . Multiple Vitamin (MULTIVITAMIN WITH  MINERALS) TABS Take 1 tablet by mouth daily.    . Omega-3 Fatty Acids (FISH OIL) 1000 MG CAPS Take 1,000 mg by mouth 2 (two) times a day.     No facility-administered medications prior to visit.     PAST MEDICAL HISTORY: Past Medical History:  Diagnosis Date  . Arthritis   . Atrial fibrillation ([place])    a. Dx 05/2014 in setting of seizure, PNA. S/p TEE/DCCV.  Marland Kitchen Bowel perforation ([place])    a. transvere and R colectomy and ileostomy in 12/25/13.  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 Deep vein thrombosis of right lower extremity ([place])   . Depression   . DVT (deep venous thrombosis) ([facility])    multiple times  . Hyperlipidemia   . Hyperplasia of prostate   . Obesity   . Peripheral neuropathy   . PFO (patent foramen ovale)    a. 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  . Psoriasis   . Seizures ([place])   . Venous stasis   . Weakness of both legs     PAST SURGICAL HISTORY: Past Surgical History:  Procedure Laterality Date  . ANKLE FRACTURE SURGERY  09/04/13  . CARDIOVERSION N/A 05/24/2014   Procedure: CARDIOVERSION;  Surgeon: Josue Hector, MD;  Location: [facility] ENDOSCOPY;  Service: Cardiovascular;  Laterality: N/A;  . COLONOSCOPY  03/26/2012   Procedure: COLONOSCOPY;  Surgeon: Beryle Beams, MD;  Location: [facility] ENDOSCOPY;  Service: Endoscopy;  Laterality: N/A;  . COLOSTOMY  12-25-13   Ruptured colon  . FLEXIBLE SIGMOIDOSCOPY N/A 12/09/2013   Procedure: FLEXIBLE SIGMOIDOSCOPY;  Surgeon: Beryle Beams, MD;  Location: [facility] ENDOSCOPY;  Service: Endoscopy;  Laterality: N/A;  . LAPAROTOMY N/A  12/25/2013   Procedure: EXPLORATORY LAPAROTOMY/Transverse and right coloectomy/ end  ileostomy and mucus  fistula;  Surgeon: Ralene Ok, MD;  Location: [place];  Service: General;  Laterality: N/A;  . TEE WITHOUT CARDIOVERSION N/A 05/24/2014   Procedure: TRANSESOPHAGEAL ECHOCARDIOGRAM (TEE);  Surgeon: Josue Hector, MD;  Location: [facility] ENDOSCOPY;  Service:  Cardiovascular;  Laterality: N/A;  . TONSILLECTOMY     as child    FAMILY HISTORY: Family History  Problem Relation Age of Onset  . Vision loss Father   . Hypertension Father   . Diabetes Father   . Heart disease Father   . Cancer Father        colon  . Diabetes Mother   . Hypertension Mother   . Heart disease Mother   . Dementia Mother   . Cancer Mother        breast ca  . Diabetes Sister   . Hyperlipidemia Sister   . Hypertension Sister   . Diabetes Brother   . Kidney disease Brother   . Heart disease Brother     SOCIAL HISTORY: Social History   Socioeconomic History  . Marital status: Single    Spouse name: Not on file  . Number of children: 0  . Years of education: HS  . Highest education level: Not on file  Occupational History  . Not on file  Social Needs  . Financial resource strain: Not on file  . Food insecurity    Worry: Not on file    Inability: Not on file  . Transportation needs    Medical: Not on file    Non-medical: Not on file  Tobacco Use  . Smoking status: Former Smoker    Types: Pipe    Quit date: 08/10/2013    Years since quitting: 5.2  . Smokeless tobacco: Never Used  Substance and Sexual Activity  . Alcohol use: No    Alcohol/week: 0.0 standard drinks  . Drug use: No  . Sexual activity: Not on file  Lifestyle  . Physical activity    Days per week: Not on file    Minutes per session: Not on file  . Stress: Not on file  Relationships  . Social Herbalist on phone: Not on file    Gets together: Not on file    Attends religious service: Not on file    Active member of club or organization: Not on file    Attends meetings of clubs or organizations: Not on file    Relationship status: Not on file  . Intimate partner violence    Fear of current or ex partner: Not on file    Emotionally abused: Not on file    Physically abused: Not on file    Forced sexual activity: Not on file  Other Topics Concern  . Not on file   Social History Narrative   Patient is single and lives with his sister.   Patient is retired from [employer].   Patient has a high school education.   Patient drinks one cup of caffeine daily.   Patient is right-handed.         DIAGNOSTIC DATA (LABS, IMAGING, TESTING) - I reviewed patient records, labs, notes, testing and imaging myself where available.   There is no weight on file to calculate BMI.  Generalized: Well groomed, , in no acute distress  Slightly nasal voice. No dysphonia or dysarthria, denies dysphagia.   OBSERVATION_  Mentation: Alert oriented to time,  place, history taking. Follows all commands speech and language fluent Cranial nerve : Sense of taste and smell intact.  Pupils were equal round reactive to light. Extraocular movements were full, visual field were full on confrontational test. Facial  strength is normal. Uvula and  tongue move in midline. Head turning and shoulder shrug  were normal and symmetric. Motor: Normal strength in the upper extremity and wrist. Grip is preserved .    Assessment and plan:    1.  Seizures well controlled on keppra and lamictal  , gabapentin.  2.  Neuropathy- and significant sensory loss. Has pain and twitching , which responded to opiods. He is very weak, non ambulatory. No edema. venostatis has been fostered by immobility. Loss of core strength up to the mid thoracic level also affects depth of breathing.   3 .paroxysmal Atrial fib with chronic anticoagulation- Dr. Debara Pickett.    The patient will continue on Lamictal and Keppra.   If his seizure frequency increases they will let us know. He has a new PCP, Dr Rachael Fee, with [facility].  RV in a virtual visit in 6 month- it is very difficult for him to reach [place], he needs an ambulance transport and this visit allowed much better interview time and observation due to his sisters help.      [name], MD  11/03/2018, 2:10 PM [facility] [address], [place], [place] [address] [phone]

## 2018-11-10 ENCOUNTER — Ambulatory Visit: Payer: Medicare Other | Admitting: Neurology

## 2018-11-10 ENCOUNTER — Encounter

## 2018-11-29 ENCOUNTER — Other Ambulatory Visit: Payer: Self-pay | Admitting: Neurology

## 2018-11-29 MED ORDER — HYDROCODONE-ACETAMINOPHEN 5-325 MG PO TABS: ORAL_TABLET | ORAL | 0 refills | Status: DC

## 2018-12-07 ENCOUNTER — Ambulatory Visit: Payer: Medicare Other | Admitting: Neurology

## 2019-01-16 ENCOUNTER — Other Ambulatory Visit: Payer: Self-pay | Admitting: Internal Medicine

## 2019-01-16 DIAGNOSIS — I480 Paroxysmal atrial fibrillation: Secondary | ICD-10-CM

## 2019-02-23 ENCOUNTER — Other Ambulatory Visit: Payer: Self-pay

## 2019-02-23 NOTE — Telephone Encounter (Signed)
Checked drug registry. He last refilled rx 11/29/18 #[id number]. Last seen 11/03/18

## 2019-02-24 MED ORDER — HYDROCODONE-ACETAMINOPHEN 5-325 MG PO TABS: ORAL_TABLET | ORAL | 0 refills | Status: DC

## 2019-04-04 ENCOUNTER — Telehealth: Payer: Self-pay | Admitting: Internal Medicine

## 2019-04-04 NOTE — Telephone Encounter (Signed)
Will route over to MD and primary nurse to review as they are sending over the EKG reading.

## 2019-04-04 NOTE — Telephone Encounter (Signed)
Patient's sister, Manuela Schwartz, states that her brother had an EKG done on Saturday 04/02/19 by [facility] The technician reported uncontrolled afib. He was advised to get in touch with Dr. Debara Pickett. Dr. Durwin Reges is to send over the results from the EKG for Dr. Debara Pickett to review.

## 2019-04-05 NOTE — Telephone Encounter (Signed)
Spoke with patient's sister. She reports his HR has been consistently >100bpm. Patient's sister also reports he had UTI symptoms around same time of elevated HR. Advised this could be correlated. Suggested they monitor VS and if HR is persistently elevated after resolution of UTI, call back

## 2019-04-05 NOTE — Telephone Encounter (Signed)
No EKG received - instead printed interpretation of results  "Atrial fibrillation with rapid ventricular response. Left anterior fascicular block. Poor r-wave progression"  Impression: "abnormal ECG. Compared to previous ECGs of 07/15/2018, there are no significant changes"

## 2019-04-05 NOTE — Telephone Encounter (Signed)
Thanks .. he has PAF - not a candidate for cardioversion. He is anticoagulated. Can't see the EKG, but if rate persists >120, could consider increase in his long-acting cardizem dose.  Dr Lemmie Evens

## 2019-04-13 ENCOUNTER — Other Ambulatory Visit: Payer: Self-pay | Admitting: Gastroenterology

## 2019-04-13 ENCOUNTER — Other Ambulatory Visit (HOSPITAL_COMMUNITY): Payer: Self-pay | Admitting: Gastroenterology

## 2019-04-13 DIAGNOSIS — Z86010 Personal history of colonic polyps: Secondary | ICD-10-CM

## 2019-04-25 NOTE — Progress Notes (Signed)
PATIENT: [name] DOB: Jun 28, 1950  REASON FOR VISIT: follow up HISTORY FROM: patient  Virtual Visit via Telephone Note  I connected with [name] on 04/26/19 at 10:00 AM EST by telephone and verified that I am speaking with the correct person using two identifiers.   I discussed the limitations, risks, security and privacy concerns of performing an evaluation and management service by telephone and the availability of in person appointments. I also discussed with the patient that there may be a patient responsible charge related to this service. The patient expressed understanding and agreed to proceed.   History of Present Illness:  04/26/19 [name] is a 68 y.o. male here today for follow up for seizures and neuropathy. He continues Lamictal 150mg  twice daily as well as levetiracetam 1500mg  twice daily. Neuropathy stable on gabapentin 600mg  in am and 1200mg  at bedtime.  He denies any seizure activity since last being seen.  He is currently being followed by [employer] as he is completely bedridden.  Recent lab work has been reviewed for today's visit and is appropriate.    History (copied from [name]'s note on 11/03/2018)  Interval history from 11-03-2018: [name] has been a longtime established patient at [facility] and was originally followed by [name].  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  He suffers from paroxysmal atrial fibrillation, he is meanwhile immobile and he developed a seizure disorder which has been treated and controlled.  The patient lives in the private home with his sister who is his main caregiver.  For several years he did not have a primary care physician but now is established with Bruce. Waymon Budge, MD at [facility] He underwent recently as a new patient to complete the work-up which included the level of his antiseizure medication including Keppra and  Neurontin, vitamin levels and an EKG.  He remains having paroxysmal atrial fibrillation and sees [name].  His Keppra level so I was informed was 24, he was told to hydrate better and drink a lot of water, he was also told to start a vitamin D supplement and discontinue vitamin B12.  The patient is well-groomed alert oriented and conversational today he keeps adjusting his bed when he is in pain trying to find a more comfortable position but overall his pain level has been the same for many years and his degree of disability has not progressed recently.  He is nonambulatory.  He does have just recovered from a sinusitis and has a little nasal speech but he is not hoarse, and he does not have trouble swallowing he reports. There is no diplopia no facial asymmetry seen.   HISTORY OF PRESENT ILLNESS: CD-[id number] [name] is a 68 year old male patient with a progressive neuropathy that left him unable to ambulate and now is bedridden. He has seizures- and none recently. Possibly one absence seizure 6 month ago. He has a lot of re activated psoriasis. Hydrocodone helped his pain and jerkiness- he no longer takes Lyrica, and he now takes OTC supplements. He uses Magnilife - a magnesium based supplement that helps his constipation as well. He has a Teacher, music.  [name] neuropathy and is becoming dependent on his stretcher followed by an abdominal surgery actually a peritonitis is ruptured colon.  My colleagues at [facility] were kind enough to evaluate [name] and thought that this reflects a Dilantin induced polyneuropathy but this  should actually improve and the patient now has not been on Dilantin for about 12 months and there has been no improvement noted neither and motor no sensory function. 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  MM- [name] is a 68 year old male with a history of seizures and neuropathy .  He returns today for an evaluation.  His  sister is with him today.  She reports that he may have an Absence seizures every 3-4 months.  She states that he just zones out when they are talking to him.  He remains on Lamictal and Keppra and is tolerating that well.  He is now off of Dilantin.  He continues to have neuropathy pain particularly in the groin area.  The patient is bedridden.  He is unable to sit up without significant discomfort.  He reports that Cymbalta helps him with his neuropathy does not help much with the pain.  He is also on gabapentin and Lyrica.  Lyrica is prescribed by another provider.  He is unsure that these medications are offering him much benefit.  At the last visit Bruce. Brett Fairy gave him a prescription of hydrocodone and he reports that this is offered him the most benefit.   He is requesting to take the medication twice a day versus once a day.  He returns today for an evaluation.  HISTORY see [name] today, a 68 year old Caucasian gentleman with advanced muscular degeneration, he is presenting on a stretcher. He as years ago lost all his deep tendon reflexes, has often pain at the joint especially in the lower extremities at the ankle on the right for example. Muscle atrophy has caused him to have flaccid muscles. Deep tendon reflexes also missing from the upper extremities.An appointment with neuromuscular consultant is requested - this patient has no diagnosis for the cause of his disability.  Begun in lower extremities , first cane than walker, 2012 . Since than progressed to stretcher. Has a now a colostomy, has no core strength, has some residual foot and arm movement. DVTs from immobility.  He continues to present also as a seizure patient who also presents with a seizure disorder, manifesting as absence seizures, not GTC. He has been on Dilantin for a long time but my goal is to wean him slowly off and exchange with another seizure medication. He also presents today with a really high blood  pressure.The patient had been started on Lamictal 100 mg twice a day and his sister confirms that his spells seem to have decreased in frequency. He remains on Keppra, which I will also decrease to 1500 mg bid from tid dosing.  I have increased his Lamictal today 250 mg twice a day and I am starting to wean him off Dilantin 30 days on 200 mg, followed by another 30 days on 100 mg at night only then discontinue. The goal is to eliminate Dilantin as a contributor to neuropathy. He will have blood drawn today for CMET, CBC, dilantin level .  The pain specialist added that he had nothing to offer above or beyond what she is already on. He has followed up with several pain specialists was prescribed hydrocodone APAP and this helps him at night with the pain - this farthe very best.  Since his sister manages the pain medication for him I think that the occasional hydrocodone APAP at night would out weigh the risks associated with the patient( he isimmobile, has a history of somewhat decreased respirations, morbid obesity, sleep  apnea) but is in believable pain..  Interval history on 07-05-14. Bruce. Craker was admitted in status epilepticus on 05/17/2014 to the [facility] and followed by Bruce. Cruzita Lederer,  He underwent multiple multiple brain scans which did not show an intracranial abnormality it was also noted that he was unable to swallow any medications during his status and therefore received Dilantin and Keppra intravenously. He was intubated for a couple of days. The status epilepticus was broken by being given IV medications. He is back on Dilantin and I have increased that in his last visit to 400 mg at night instead of alternating 300 with 400 we will resume this today. I refill his Keppra today as well as his Neurontin which I further increased by 600 mg. Neurontin also an antiepileptic medication is used in his case to treat his significant sensory abnormalities related to a neuropathy. What  is remarkable to me is that the patient has almost no control over his lower extremities and lost all muscle tone by his upper extremities provide good grip strength and normal movement and tone. A nerve conduction study and EMG with Bruce. Jannifer Franklin just documented neuropathy. The admission to the hospital was precipitated by developing pneumonia. The CT scan of the head was reviewed and the report is copied to today's note. I have discussed with the patient today that I would like for him to undergo a thoracic spine MRI if possible his last brain MRI by my records is from 2004, ordered by Bruce. Jannifer Franklin at the time. In addition I do not need a brain MRI at this point I think that it is clear that the pneumonia cost him to have the seizures which is not an unknown or uncommon trigger. Any infection below the seizure threshold. What I would like to research further why his lower extremities are disproportionately affected by a neuropathy.  Interval history from 10-03-14. She no longer has physical therapy provided through [facility] payment, he and his sister are using some weight exercises at home. He has also healed from this macerated skin near the colostomy,. A honey-based product has been helpful in establishing better nor skin. Bruce. Cornelious Bryant thousand patient and exchanged his nocturnal time dose of gabapentin with doxepin. This helped him to sleep. Does not help the pain. He also has a rectal pain pulled endorsed neuropathy. I would like for Bruce. Cornelious Bryant to have another visit with the patient also wondered if she couldperform any kind of study on the pudendal nerve?.CD  Interval history from 10/02/2015, Bruce. Guidice has not had any significant seizure activity over the last 12 months at least. His main problem is that due to his immobility and venostasis he has painful neuropathy, venostasis and leg ulcers.   Observations/Objective:  Generalized: Well developed, in no acute distress, bedridden  Mentation: Alert  oriented to time, place, history taking. Follows all commands speech and language fluent   Assessment and Plan:  68 y.o. year old male  has a past medical history of Arthritis, Atrial fibrillation ([place]), Bowel perforation (HCC), Deep vein thrombophlebitis of left leg ([place]), Deep vein thrombosis of right lower extremity ([place]), Depression, DVT (deep venous thrombosis) ([place]), Hyperlipidemia, Hyperplasia of prostate, Obesity, Peripheral neuropathy, PFO (patent foramen ovale), Psoriasis, Seizures ([place]), Venous stasis, and Weakness of both legs. here with    ICD-10-CM   1. Seizure disorder ([place])  G40.909   2. Neuropathy  G62.9     Overall, Bruce. Risden is doing fairly well.  Seizures are very well  managed on current regimen of Lamictal 150 mg twice daily and levetiracetam 1500 mg twice daily.  He continues gabapentin 600 mg in the a.m. and 1200 mg at bedtime with some improvement in neuropathy pain.  He will continue close follow-up with his primary care provider.  Activity as tolerated.  He will follow-up with [place] in 6 months, sooner if needed.  He verbalizes understanding and agreement with this plan.  No orders of the defined types were placed in this encounter.   No orders of the defined types were placed in this encounter.    Follow Up Instructions:  I discussed the assessment and treatment plan with the patient. The patient was provided an opportunity to ask questions and all were answered. The patient agreed with the plan and demonstrated an understanding of the instructions.   The patient was advised to call back or seek an in-person evaluation if the symptoms worsen or if the condition fails to improve as anticipated.  I provided 15 minutes of non-face-to-face time during this encounter.  Patient is located at his place of residence.  His sister aids in history.  Provider is in the office.   Debbora Presto, NP

## 2019-04-26 ENCOUNTER — Encounter: Payer: Self-pay | Admitting: Family Medicine

## 2019-04-26 ENCOUNTER — Telehealth (INDEPENDENT_AMBULATORY_CARE_PROVIDER_SITE_OTHER): Payer: Medicare Other | Admitting: Family Medicine

## 2019-04-26 DIAGNOSIS — G629 Polyneuropathy, unspecified: Secondary | ICD-10-CM

## 2019-04-26 DIAGNOSIS — G40909 Epilepsy, unspecified, not intractable, without status epilepticus: Secondary | ICD-10-CM | POA: Diagnosis not present

## 2019-05-26 ENCOUNTER — Other Ambulatory Visit: Payer: Self-pay

## 2019-05-26 MED ORDER — HYDROCODONE-ACETAMINOPHEN 5-325 MG PO TABS: ORAL_TABLET | ORAL | 0 refills | Status: DC

## 2019-05-31 ENCOUNTER — Encounter (HOSPITAL_COMMUNITY): Payer: Self-pay

## 2019-05-31 ENCOUNTER — Other Ambulatory Visit: Payer: Self-pay

## 2019-05-31 ENCOUNTER — Ambulatory Visit (HOSPITAL_COMMUNITY)
Admission: RE | Admit: 2019-05-31 | Discharge: 2019-05-31 | Disposition: A | Discharge status: Home / Self Care | Payer: Medicare Other | Source: Ambulatory Visit | Attending: Gastroenterology | Admitting: Gastroenterology

## 2019-05-31 DIAGNOSIS — K760 Fatty (change of) liver, not elsewhere classified: Secondary | ICD-10-CM | POA: Insufficient documentation

## 2019-05-31 DIAGNOSIS — N200 Calculus of kidney: Secondary | ICD-10-CM | POA: Diagnosis not present

## 2019-05-31 DIAGNOSIS — Z86010 Personal history of colonic polyps: Secondary | ICD-10-CM | POA: Diagnosis present

## 2019-05-31 DIAGNOSIS — N281 Cyst of kidney, acquired: Secondary | ICD-10-CM | POA: Diagnosis not present

## 2019-05-31 DIAGNOSIS — K8020 Calculus of gallbladder without cholecystitis without obstruction: Secondary | ICD-10-CM | POA: Diagnosis not present

## 2019-05-31 HISTORY — DX: Type 2 diabetes mellitus without complications: E11.9

## 2019-05-31 HISTORY — DX: Essential (primary) hypertension: I10

## 2019-05-31 MED ORDER — SODIUM CHLORIDE (PF) 0.9 % IJ SOLN
INTRAMUSCULAR | Status: AC
  Filled 2019-05-31: qty 50

## 2019-05-31 MED ORDER — IOHEXOL 300 MG/ML  SOLN
100.0000 mL | Freq: Once | INTRAMUSCULAR | Status: AC | PRN
  Administered 2019-05-31: 14:00:00 100 mL via INTRAVENOUS

## 2019-08-24 ENCOUNTER — Other Ambulatory Visit: Payer: Self-pay | Admitting: Neurology

## 2019-08-24 MED ORDER — HYDROCODONE-ACETAMINOPHEN 5-325 MG PO TABS: ORAL_TABLET | ORAL | 0 refills | Status: DC

## 2019-09-27 ENCOUNTER — Other Ambulatory Visit: Payer: Self-pay | Admitting: Neurology

## 2019-09-27 MED ORDER — HYDROCODONE-ACETAMINOPHEN 5-325 MG PO TABS: ORAL_TABLET | ORAL | 0 refills | Status: DC

## 2019-10-08 ENCOUNTER — Other Ambulatory Visit: Payer: Self-pay | Admitting: Internal Medicine

## 2019-10-08 DIAGNOSIS — I480 Paroxysmal atrial fibrillation: Secondary | ICD-10-CM

## 2019-10-11 NOTE — Telephone Encounter (Signed)
Called and spoke w/pt's wife stated that he is 350-400lb for dosing of the pradaxa.

## 2019-10-14 ENCOUNTER — Other Ambulatory Visit: Payer: Self-pay | Admitting: Neurology

## 2019-10-19 ENCOUNTER — Ambulatory Visit: Payer: Medicare Other | Admitting: Internal Medicine

## 2019-10-31 ENCOUNTER — Other Ambulatory Visit: Payer: Self-pay | Admitting: Neurology

## 2019-10-31 MED ORDER — HYDROCODONE-ACETAMINOPHEN 5-325 MG PO TABS: ORAL_TABLET | ORAL | 0 refills | Status: DC

## 2019-11-12 ENCOUNTER — Other Ambulatory Visit: Payer: Self-pay | Admitting: Neurology

## 2019-11-21 ENCOUNTER — Telehealth (INDEPENDENT_AMBULATORY_CARE_PROVIDER_SITE_OTHER): Payer: Medicare Other | Admitting: Internal Medicine

## 2019-11-21 ENCOUNTER — Encounter: Payer: Self-pay | Admitting: Internal Medicine

## 2019-11-21 VITALS — BP 135/78 | HR 85 | Temp 98.1°F

## 2019-11-21 DIAGNOSIS — Z7901 Long term (current) use of anticoagulants: Secondary | ICD-10-CM | POA: Diagnosis not present

## 2019-11-21 DIAGNOSIS — I878 Other specified disorders of veins: Secondary | ICD-10-CM

## 2019-11-21 DIAGNOSIS — R600 Localized edema: Secondary | ICD-10-CM

## 2019-11-21 DIAGNOSIS — I480 Paroxysmal atrial fibrillation: Secondary | ICD-10-CM

## 2019-11-21 DIAGNOSIS — E782 Mixed hyperlipidemia: Secondary | ICD-10-CM

## 2019-11-21 NOTE — Patient Instructions (Signed)
Medication Instructions:  Your physician recommends that you continue on your current medications as directed. Please refer to the Current Medication list given to you today.  *If you need a refill on your cardiac medications before your next appointment, please call your pharmacy*   Follow-Up: At [facility], you and your health needs are our priority.  As part of our continuing mission to provide you with exceptional heart care, we have created designated Provider Care Teams.  These Care Teams include your primary Cardiologist (physician) and Advanced Practice Providers (APPs -  Physician Assistants and Nurse Practitioners) who all work together to provide you with the care you need, when you need it.  We recommend signing up for the patient portal called "MyChart".  Sign up information is provided on this After Visit Summary.  MyChart is used to connect with patients for Virtual Visits (Telemedicine).  Patients are able to view lab/test results, encounter notes, upcoming appointments, etc.  Non-urgent messages can be sent to your provider as well.   To learn more about what you can do with MyChart, go to [email or web address].    Your next appointment:   12 month(s)  The format for your next appointment:   Virtual Visit   Provider:   K. Mali Hilty, MD   Other Instructions

## 2019-11-21 NOTE — Progress Notes (Signed)
Virtual Visit via Video Note   This visit type was conducted due to national recommendations for restrictions regarding the COVID-19 Pandemic (e.g. social distancing) in an effort to limit this patient's exposure and mitigate transmission in our community.  Due to his co-morbid illnesses, this patient is at least at moderate risk for complications without adequate follow up.  This format is felt to be most appropriate for this patient at this time.  All issues noted in this document were discussed and addressed.  A limited physical exam was performed with this format.  Please refer to the patient's chart for his consent to telehealth for [facility].   Evaluation Performed:  Doximity video visit  Date:  11/21/2019   ID:  [name], DOB 1951-01-23, MRN [id number]  Patient Location:  [place] [address]  Provider location:   [address] [place], [place] [address]  PCP:  Merlene Laughter, MD  Cardiologist:  Pixie Casino, MD Electrophysiologist:  None   Chief Complaint:  Leg pain  History of Present Illness:    [name] is a 69 y.o. male who presents via audio/video conferencing for a telehealth visit today.  [name] was seen today via Doximity video visit. He is chronically bedbound due to neuropathy. He has had problems with chronic leg edema which has improved. Recently he has noted an irregular heartbeat and an EKG by a home health doctor seems to have confirmed afib - he has known PAF on Pradaxa. Labs stable recently - he was noted to have elevated triglycerides and changed to a fibrate and fish oil. He denies chest pain, DOE or symptomatic atrial fibrillation.  11/21/2019  [name] was seen today for video visit.  This works very well to him because he is chronically bedbound due to neuropathy.  He has persistent lower extremity edema and atrial fibrillation which seems more longstanding persistent.  He is on Pradaxa.   In November he had a UTI and was noted be tachycardic however that resolved again after treatment.  He is apparently prone to UTIs and has had several recently.  He currently has a UTI of her heart rate was 85 today and blood pressure is well controlled.  He continues to have care with [facility] who does labs every few months.  The family reports no worsening anemia and normal renal function.  He denies any bleeding difficulty on Pradaxa.  His triglycerides have been elevated in 3-400 range however after being started on fish oils and fenofibrate are now down in the 200s.  The patient does not have symptoms concerning for COVID-19 infection (fever, chills, cough, or new SHORTNESS OF BREATH).    Prior CV studies:   The following studies were reviewed today:  Chart review  PMHx:  Past Medical History:  Diagnosis Date  . Arthritis   . Atrial fibrillation ([place])    a. Dx 05/2014 in setting of seizure, PNA. S/p TEE/DCCV.  Marland Kitchen Bowel perforation ([place])    a. transvere and R colectomy and ileostomy in 12/25/13.  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 Deep vein thrombosis of right lower extremity ([place])   . Depression   . Diabetes mellitus without complication ([place])   . DVT (deep venous thrombosis) (HCC)    multiple times  . Hyperlipidemia   . Hyperplasia of prostate   . Hypertension   . Obesity   . Peripheral neuropathy   .  PFO (patent foramen ovale)    a. 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  . Psoriasis   . Seizures ([place])   . Venous stasis   . Weakness of both legs     Past Surgical History:  Procedure Laterality Date  . ANKLE FRACTURE SURGERY  09/04/13  . CARDIOVERSION N/A 05/24/2014   Procedure: CARDIOVERSION;  Surgeon: Josue Hector, MD;  Location: [facility] ENDOSCOPY;  Service: Cardiovascular;  Laterality: N/A;  . COLONOSCOPY  03/26/2012   Procedure: COLONOSCOPY;  Surgeon: Beryle Beams, MD;  Location: [facility] ENDOSCOPY;   Service: Endoscopy;  Laterality: N/A;  . COLOSTOMY  12-25-13   Ruptured colon  . FLEXIBLE SIGMOIDOSCOPY N/A 12/09/2013   Procedure: FLEXIBLE SIGMOIDOSCOPY;  Surgeon: Beryle Beams, MD;  Location: [facility] ENDOSCOPY;  Service: Endoscopy;  Laterality: N/A;  . LAPAROTOMY N/A 12/25/2013   Procedure: EXPLORATORY LAPAROTOMY/Transverse and right coloectomy/ end  ileostomy and mucus  fistula;  Surgeon: Ralene Ok, MD;  Location: [facility];  Service: General;  Laterality: N/A;  . TEE WITHOUT CARDIOVERSION N/A 05/24/2014   Procedure: TRANSESOPHAGEAL ECHOCARDIOGRAM (TEE);  Surgeon: Josue Hector, MD;  Location: [facility] ENDOSCOPY;  Service: Cardiovascular;  Laterality: N/A;  . TONSILLECTOMY     as child    FAMHx:  Family History  Problem Relation Age of Onset  . Vision loss Father   . Hypertension Father   . Diabetes Father   . Heart disease Father   . Cancer Father        colon  . Diabetes Mother   . Hypertension Mother   . Heart disease Mother   . Dementia Mother   . Cancer Mother        breast ca  . Diabetes Sister   . Hyperlipidemia Sister   . Hypertension Sister   . Diabetes Brother   . Kidney disease Brother   . Heart disease Brother     SOCHx:   reports that he quit smoking about 6 years ago. His smoking use included pipe. He has never used smokeless tobacco. He reports that he does not drink alcohol and does not use drugs.  ALLERGIES:  Allergies  Allergen Reactions  . Penicillins Other (See Comments)    Pt has no personal reaction to this med, but his mother/brother do. Has patient had a PCN reaction causing immediate rash, facial/tongue/throat swelling, SOB or lightheadedness with hypotension: No Has patient had a PCN reaction causing severe rash involving mucus membranes or skin necrosis: No Has patient had a PCN reaction that required hospitalization No Has patient had a PCN reaction occurring within the last 10 years: No If all of the above answers are "NO", then may proceed with  Cephalosporin    MEDS:  Current Meds  Medication Sig  . acetaminophen (TYLENOL) 500 MG tablet Take 500-1,000 mg by mouth every 6 (six) hours as needed for mild pain, moderate pain, fever or headache.  . ALPHA LIPOIC ACID PO Take 1 capsule by mouth daily.  . Calcium Carbonate-Vitamin D (CALCIUM 600+D) 600-400 MG-UNIT tablet Take 1 tablet by mouth daily.  . Cholecalciferol (VITAMIN D3 PO) Take 1 capsule by mouth daily.  . ciprofloxacin (CIPRO) 500 MG tablet Take 500 mg by mouth 2 (two) times daily. For UTI - 7 days  . dabigatran (PRADAXA) 150 MG CAPS capsule Take 1 capsule (150 mg total) by mouth 2 (two) times daily. LABS NEEDED FOR FURTHER REFILLS  . diltiazem (CARDIZEM CD) 240 MG 24 hr  capsule Take 240 mg by mouth daily.  Marland Kitchen doxepin (SINEQUAN) 75 MG capsule Take 150 mg by mouth at bedtime.   . DULoxetine (CYMBALTA) 30 MG capsule Take 30 mg by mouth daily. Pt takes with a 60mg  capsule.  . DULoxetine (CYMBALTA) 60 MG capsule Take 60 mg by mouth daily. Pt takes with a 30mg  capsule.  . fenofibrate 160 MG tablet Take 160 mg by mouth daily.  Marland Kitchen gabapentin (NEURONTIN) 600 MG tablet 1 tab at lunch and 2 at dinner. PO  . glipiZIDE (GLUCOTROL XL) 2.5 MG 24 hr tablet Take 2.5 mg by mouth in the morning and at bedtime.   Marland Kitchen HYDROcodone-acetaminophen (NORCO/VICODIN) 5-325 MG tablet Up to twice a day as needed.  . lamoTRIgine (LAMICTAL) 150 MG tablet TAKE 1 TABLET BY MOUTH TWICE A DAY  . levETIRAcetam (KEPPRA) 750 MG tablet TAKE 2 TABLETS (1,500 MG TOTAL) BY MOUTH 2 (TWO) TIMES DAILY.  Marland Kitchen LORazepam (ATIVAN) 1 MG tablet Use 1 mg tab to break focal seizure activity. As needed, do not exceed 2 mg a day.  . losartan (COZAAR) 25 MG tablet Take 25 mg by mouth 2 (two) times daily.   . metFORMIN (GLUCOPHAGE-XR) 500 MG 24 hr tablet 500 mg 2 (two) times daily.  . Multiple Vitamin (MULTIVITAMIN WITH MINERALS) TABS Take 1 tablet by mouth daily.  . Omega-3 Fatty Acids (FISH OIL) 1000 MG CAPS Take 1,000 mg by mouth 2  (two) times a day.     ROS: Pertinent items noted in HPI and remainder of comprehensive ROS otherwise negative.  Labs/Other Tests and Data Reviewed:    Recent Labs: No results found for requested labs within last 8760 hours.   Recent Lipid Panel Lab Results  Component Value Date/Time   CHOL 136 10/17/2013 12:00 AM   TRIG 65 10/17/2013 12:00 AM   HDL 45 10/17/2013 12:00 AM   LDLCALC 78 10/17/2013 12:00 AM    Wt Readings from Last 3 Encounters:  06/14/15 300 lb (136.1 kg)  04/15/15 300 lb (136.1 kg)  10/24/14 280 lb (127 kg)     Exam:    Vital Signs:  BP 135/78   Pulse 85   Temp 98.1 F (36.7 C)    General appearance: alert, no distress and moderately obese Lungs: no visual respiratory difficulty Extremities: edema 1+ bilateral stasis edema Skin: pale Neurologic: Grossly normal  ASSESSMENT & PLAN:    1. PAF-maintaining sinus, CHADSVASC score of 3 2. History of DVT on Pradaxa 3. Nonambulatory 4. Dyslipidemia -specifically high triglycerides 5. Bilateral lower extremity venous stasis edema  Mr. Handy seems to be stable with regards to A. fib which is generally rate controlled unless he has an active UTI or fever.  He is on Pradaxa and has history of DVT and A. fib therefore this is a long-term medication.  Renal function and CBC have been stable.  His triglycerides have come down on therapy.  No changes to his medicines today.  Follow-up with me annually via virtual visit or sooner as necessary.  COVID-19 Education: The signs and symptoms of COVID-19 were discussed with the patient and how to seek care for testing (follow up with PCP or arrange E-visit).  The importance of social distancing was discussed today.  Patient Risk:   After full review of this patients clinical status, I feel that they are at least moderate risk at this time.  Time:   Today, I have spent 25 minutes with the patient with telehealth technology discussing PAF, HTN, dyslipidemia,  edema.      Medication Adjustments/Labs and Tests Ordered: Current medicines are reviewed at length with the patient today.  Concerns regarding medicines are outlined above.   Tests Ordered: No orders of the defined types were placed in this encounter.   Medication Changes: No orders of the defined types were placed in this encounter.   Disposition:  in 1 year(s)  Pixie Casino, MD, [facility], [place] Director of the Advanced Lipid Disorders &  Cardiovascular Risk Reduction Clinic Diplomate of the American Board of Clinical Lipidology Attending Cardiologist  Direct Dial: [phone]  Fax: [phone]  Website:  [email or web address]  Pixie Casino, MD  11/21/2019 8:45 AM

## 2019-12-02 ENCOUNTER — Telehealth: Payer: Self-pay | Admitting: Neurology

## 2019-12-06 ENCOUNTER — Other Ambulatory Visit: Payer: Self-pay | Admitting: Neurology

## 2019-12-06 MED ORDER — HYDROCODONE-ACETAMINOPHEN 5-325 MG PO TABS: ORAL_TABLET | ORAL | 0 refills | Status: DC

## 2019-12-06 NOTE — Telephone Encounter (Signed)
Called to advise the prescription was called in. Instructed them to call back to be scheduled for a office visit (video visit). Please schedule with Amy or Dr Brett Fairy for whichever visit they prefer

## 2019-12-06 NOTE — Telephone Encounter (Signed)
I have routed this request to Dr Dohmeier for review. The pt is due for the medication and [place] registry was verified.  

## 2019-12-06 NOTE — Telephone Encounter (Signed)
Pt's sister Ottis Vacha (on [place]) request refill for HYDROcodone-acetaminophen (NORCO/VICODIN) 5-325 MG tablet at [id number]. Pt's sister stated [place] only allows prescription a month at a time. Pt is down to 1 pill

## 2019-12-19 ENCOUNTER — Other Ambulatory Visit: Payer: Self-pay | Admitting: Neurology

## 2019-12-19 ENCOUNTER — Encounter: Payer: Self-pay | Admitting: Neurology

## 2020-01-02 ENCOUNTER — Other Ambulatory Visit: Payer: Self-pay | Admitting: Internal Medicine

## 2020-01-02 DIAGNOSIS — I480 Paroxysmal atrial fibrillation: Secondary | ICD-10-CM

## 2020-01-04 ENCOUNTER — Other Ambulatory Visit: Payer: Self-pay | Admitting: Neurology

## 2020-01-04 MED ORDER — HYDROCODONE-ACETAMINOPHEN 5-325 MG PO TABS: ORAL_TABLET | ORAL | 0 refills | Status: DC

## 2020-01-19 ENCOUNTER — Telehealth: Payer: Self-pay | Admitting: Family Medicine

## 2020-01-19 ENCOUNTER — Telehealth (INDEPENDENT_AMBULATORY_CARE_PROVIDER_SITE_OTHER): Payer: Medicare Other | Admitting: Neurology

## 2020-01-19 ENCOUNTER — Encounter: Payer: Self-pay | Admitting: Neurology

## 2020-01-19 DIAGNOSIS — G40A01 Absence epileptic syndrome, not intractable, with status epilepticus: Secondary | ICD-10-CM | POA: Diagnosis not present

## 2020-01-19 DIAGNOSIS — G6289 Other specified polyneuropathies: Secondary | ICD-10-CM | POA: Insufficient documentation

## 2020-01-19 DIAGNOSIS — Z86718 Personal history of other venous thrombosis and embolism: Secondary | ICD-10-CM | POA: Diagnosis not present

## 2020-01-19 DIAGNOSIS — I4891 Unspecified atrial fibrillation: Secondary | ICD-10-CM

## 2020-01-19 DIAGNOSIS — G5793 Unspecified mononeuropathy of bilateral lower limbs: Secondary | ICD-10-CM | POA: Diagnosis not present

## 2020-01-19 MED ORDER — LIDOCAINE-PRILOCAINE 2.5-2.5 % EX CREA: 1.0000 "application " | TOPICAL_CREAM | Freq: Two times a day (BID) | CUTANEOUS | 1 refills | Status: DC

## 2020-01-19 NOTE — Patient Instructions (Signed)
Lidocaine; Prilocaine cream What is this medicine? LIDOCAINE; PRILOCAINE (LYE doe kane; PRIL oh kane) is a topical anesthetic that causes loss of feeling in the skin and surrounding tissues. It is used to numb the skin before procedures or injections. This medicine may be used for other purposes; ask your health care provider or pharmacist if you have questions. COMMON BRAND NAME(S): ANODYNE LPT, EMLA, IV Novice Pack, Lido-Prilo Caine, LiProZonePak, Lowe[employer], LP [employer], NIKE with [employer], Microvix LP, [place], [place]-II, Port-Prep, PrepIV, Prilovix, AGCO Corporation, Prilovix Affiliated [employer] Services, Prilovix Plus, Prilovix Ultralite, Prilovix Ultralite Plus, Prilovixil, Pri[place]-II, Relador, [place], [facility] What should I tell my health care provider before I take this medicine? They need to know if you have any of these conditions:  G6PD deficiency  heart disease  kidney disease  liver disease  skin conditions or sensitivity  an unusual or allergic reaction to lidocaine, prilocaine, other medicines, foods, dyes, or preservatives  pregnant or trying to get pregnant  breast-feeding How should I use this medicine? This medicine is for external use only on the skin. Do not take by mouth. Follow the directions on the prescription label. Wash hands before and after use. Do not use more or leave in contact with the skin longer than directed. Do not apply to eyes or open wounds. It can cause irritation and blurred or temporary loss of vision. If this medicine comes in contact with your eyes, immediately rinse the eye with water. Do not touch or rub the eye. Contact your health care provider right away. Talk to your pediatrician regarding the use of this medicine in children. While this medicine may be prescribed for children for selected conditions, precautions do apply. Overdosage: If you think you have taken too much of this medicine contact a poison control center or emergency room  at once. NOTE: This medicine is only for you. Do not share this medicine with others. What if I miss a dose? This medicine is usually only applied once prior to each procedure. It must be in contact with the skin for a period of time for it to work. If you applied this medicine later than directed, tell your health care professional before starting the procedure. What may interact with this medicine? This medicine may interact with the following medications:  acetaminophen  certain antibiotics like dapsone, nitrofurantoin, aminosalicylic acid, sulfasalazine  certain medicines for seizures like phenobarbital, phenytoin, valproic acid  chloroquine  cyclophosphamide  flutamide  hydroxyurea  ifosfamide  metoclopramide  nitroglycerin  other local anesthetics like pramoxine, tetracaine  primaquine  quinine This list may not describe all possible interactions. Give your health care provider a list of all the medicines, herbs, non-prescription drugs, or dietary supplements you use. Also tell them if you smoke, drink alcohol, or use illegal drugs. Some items may interact with your medicine. What should I watch for while using this medicine? Be careful to avoid injury to the treated area while it is numb and you are not aware of pain. Avoid scratching, rubbing, or exposing the treated area to hot or cold temperatures until complete sensation has returned. The numb feeling will wear off a few hours after applying the cream. What side effects may I notice from receiving this medicine? Side effects that you should report to your doctor or health care professional as soon as possible:  blurred vision  chest pain  difficulty breathing  dizziness  drowsiness  fast or irregular heartbeat  skin rash or itching  swelling of  your throat, lips, or face  trembling Side effects that usually do not require medical attention (report to your doctor or health care professional if they  continue or are bothersome):  changes in ability to feel hot or cold  redness and swelling at the application site This list may not describe all possible side effects. Call your doctor for medical advice about side effects. You may report side effects to FDA at 1-800-FDA-1088. Where should I keep my medicine? Keep out of reach of children. Store at room temperature between 15 and 30 degrees C (59 and 86 degrees F). Keep container tightly closed. Throw away any unused medicine after the expiration date. NOTE: This sheet is a summary. It may not cover all possible information. If you have questions about this medicine, talk to your doctor, pharmacist, or health care provider.  2020 Elsevier/Gold Standard (2017-04-20 10:11:16)

## 2020-01-19 NOTE — Telephone Encounter (Signed)
..   Pt understands that although there may be some limitations with this type of visit, we will take all precautions to reduce any security or privacy concerns.  Pt understands that this will be treated like an in office visit and we will file with pt's insurance, and there may be a patient responsible charge related to this service. ? ?

## 2020-01-19 NOTE — Progress Notes (Signed)
Virtual Visit via Video Note  I connected with [name] on 01/19/20 at  3:30 PM EDT by a video enabled telemedicine application and verified that I am speaking with the correct person using two identifiers.  Location: Patient: at home  Provider: at [facility]    I discussed the limitations of evaluation and management by telemedicine and the availability of in person appointments. The patient expressed understanding and agreed to proceed.  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 Immobile for over 15 years-  former patient of Dr.Love. he is fragile and has to come by ambulance to each outpatient visit.      Observations/Objective: patient reports no increase in pain-    Assessment and Plan: alert oriented-  assured me he has no new pain, no SOB, no sleep problems.    Follow Up Instructions: request for 5 % lidocaine for topical numbing cream or ointment.  Cannot tolerate adhesive in patches.  Other Refills not pending, Keppra and Lamictal were recently refilled.   Blood levels done by Dr. Durwin Reges - Lamictal was 3.5 / low normal.      I discussed the assessment and treatment plan with the patient. The patient was provided an opportunity to ask questions and all were answered. The patient agreed with the plan and demonstrated an understanding of the instructions.   The patient was advised to call back or seek an in-person evaluation if the symptoms worsen or if the condition fails to improve as anticipated.  I provided 18 minutes of non-face-to-face time during this encounter.   [name], MD

## 2020-01-19 NOTE — Telephone Encounter (Signed)
Noted  

## 2020-02-02 ENCOUNTER — Other Ambulatory Visit: Payer: Self-pay | Admitting: Neurology

## 2020-02-06 MED ORDER — HYDROCODONE-ACETAMINOPHEN 5-325 MG PO TABS: ORAL_TABLET | ORAL | 0 refills | Status: DC

## 2020-03-04 ENCOUNTER — Other Ambulatory Visit: Payer: Self-pay | Admitting: Neurology

## 2020-03-06 MED ORDER — HYDROCODONE-ACETAMINOPHEN 5-325 MG PO TABS: ORAL_TABLET | ORAL | 0 refills | Status: DC

## 2020-03-14 ENCOUNTER — Encounter: Payer: Self-pay | Admitting: Neurology

## 2020-03-14 ENCOUNTER — Other Ambulatory Visit: Payer: Self-pay | Admitting: Neurology

## 2020-04-02 ENCOUNTER — Other Ambulatory Visit: Payer: Self-pay | Admitting: Neurology

## 2020-04-02 MED ORDER — HYDROCODONE-ACETAMINOPHEN 5-325 MG PO TABS: ORAL_TABLET | ORAL | 0 refills | Status: DC

## 2020-04-10 ENCOUNTER — Encounter: Payer: Self-pay | Admitting: Neurology

## 2020-04-10 ENCOUNTER — Other Ambulatory Visit: Payer: Self-pay | Admitting: *Deleted

## 2020-04-10 MED ORDER — LEVETIRACETAM 750 MG PO TABS: 1500.0000 mg | ORAL_TABLET | Freq: Two times a day (BID) | ORAL | 1 refills | Status: DC

## 2020-05-02 ENCOUNTER — Other Ambulatory Visit: Payer: Self-pay | Admitting: Neurology

## 2020-05-03 MED ORDER — HYDROCODONE-ACETAMINOPHEN 5-325 MG PO TABS: ORAL_TABLET | ORAL | 0 refills | Status: DC

## 2020-05-03 NOTE — Telephone Encounter (Signed)
Last filled 04/04/2020 for #[id number]. Last visit was 01/19/20, no follow up scheduled.

## 2020-05-10 ENCOUNTER — Telehealth: Payer: Self-pay | Admitting: Neurology

## 2020-05-10 NOTE — Telephone Encounter (Signed)
I left the patient a message letting him know that Dr. [name] did not receive any labs from his PCP office. He will need to request them to be sent again. Fax to [phone] - Dr. Oliva Bustard attention.

## 2020-05-10 NOTE — Telephone Encounter (Signed)
Dear Kara Mead,   Reg September labs: There is neither a new lab result in EPIC since 2020 nor under media tap- this means it has either not been received or was never scanned in.   CD

## 2020-05-26 ENCOUNTER — Encounter (HOSPITAL_COMMUNITY): Payer: Self-pay

## 2020-05-26 ENCOUNTER — Other Ambulatory Visit: Payer: Self-pay

## 2020-05-26 ENCOUNTER — Emergency Department (HOSPITAL_COMMUNITY)
Admission: EM | Admit: 2020-05-26 | Discharge: 2020-05-26 | Disposition: A | Discharge status: Home / Self Care | Payer: Medicare Other | Attending: Emergency Medicine | Admitting: Emergency Medicine

## 2020-05-26 DIAGNOSIS — M79605 Pain in left leg: Secondary | ICD-10-CM

## 2020-05-26 DIAGNOSIS — N3000 Acute cystitis without hematuria: Secondary | ICD-10-CM

## 2020-05-26 DIAGNOSIS — L89302 Pressure ulcer of unspecified buttock, stage 2: Secondary | ICD-10-CM | POA: Diagnosis not present

## 2020-05-26 DIAGNOSIS — Z79899 Other long term (current) drug therapy: Secondary | ICD-10-CM | POA: Diagnosis not present

## 2020-05-26 DIAGNOSIS — Z7984 Long term (current) use of oral hypoglycemic drugs: Secondary | ICD-10-CM | POA: Diagnosis not present

## 2020-05-26 DIAGNOSIS — Z87891 Personal history of nicotine dependence: Secondary | ICD-10-CM | POA: Diagnosis not present

## 2020-05-26 DIAGNOSIS — I10 Essential (primary) hypertension: Secondary | ICD-10-CM | POA: Insufficient documentation

## 2020-05-26 DIAGNOSIS — R21 Rash and other nonspecific skin eruption: Secondary | ICD-10-CM | POA: Diagnosis not present

## 2020-05-26 DIAGNOSIS — R1030 Lower abdominal pain, unspecified: Secondary | ICD-10-CM | POA: Diagnosis present

## 2020-05-26 DIAGNOSIS — E119 Type 2 diabetes mellitus without complications: Secondary | ICD-10-CM | POA: Insufficient documentation

## 2020-05-26 LAB — CBC WITH DIFFERENTIAL/PLATELET
Abs Immature Granulocytes: 0.02 10*3/uL (ref 0.00–0.07)
Basophils Absolute: 0 10*3/uL (ref 0.0–0.1)
Basophils Relative: 0 %
Eosinophils Absolute: 0.4 10*3/uL (ref 0.0–0.5)
Eosinophils Relative: 5 %
HCT: 44.2 % (ref 39.0–52.0)
Hemoglobin: 13.9 g/dL (ref 13.0–17.0)
Immature Granulocytes: 0 %
Lymphocytes Relative: 13 %
Lymphs Abs: 1 10*3/uL (ref 0.7–4.0)
MCH: 29.6 pg (ref 26.0–34.0)
MCHC: 31.4 g/dL (ref 30.0–36.0)
MCV: 94 fL (ref 80.0–100.0)
Monocytes Absolute: 0.7 10*3/uL (ref 0.1–1.0)
Monocytes Relative: 9 %
Neutro Abs: 5.9 10*3/uL (ref 1.7–7.7)
Neutrophils Relative %: 73 %
Platelets: 249 10*3/uL (ref 150–400)
RBC: 4.7 MIL/uL (ref 4.22–5.81)
RDW: 13.9 % (ref 11.5–15.5)
WBC: 8.1 10*3/uL (ref 4.0–10.5)
nRBC: 0 % (ref 0.0–0.2)

## 2020-05-26 LAB — URINALYSIS, ROUTINE W REFLEX MICROSCOPIC
Bilirubin Urine: NEGATIVE
Glucose, UA: NEGATIVE mg/dL
Ketones, ur: NEGATIVE mg/dL
Nitrite: POSITIVE — AB
Protein, ur: NEGATIVE mg/dL
Specific Gravity, Urine: 1.015 (ref 1.005–1.030)
WBC, UA: >50 WBC/hpf — ABNORMAL HIGH (ref 0–5)
pH: 6 (ref 5.0–8.0)

## 2020-05-26 LAB — BASIC METABOLIC PANEL
Anion gap: 7 (ref 5–15)
BUN: 13 mg/dL (ref 8–23)
CO2: 28 mmol/L (ref 22–32)
Calcium: 8.8 mg/dL — ABNORMAL LOW (ref 8.9–10.3)
Chloride: 101 mmol/L (ref 98–111)
Creatinine, Ser: 0.65 mg/dL (ref 0.61–1.24)
GFR, Estimated: >60 mL/min (ref >60)
Glucose, Bld: 132 mg/dL — ABNORMAL HIGH (ref 70–99)
Potassium: 4.2 mmol/L (ref 3.5–5.1)
Sodium: 136 mmol/L (ref 135–145)

## 2020-05-26 MED ORDER — CEPHALEXIN 500 MG PO CAPS: 500.0000 mg | ORAL_CAPSULE | Freq: Three times a day (TID) | ORAL | 0 refills | Status: DC

## 2020-05-26 MED ORDER — MORPHINE SULFATE (PF) 4 MG/ML IV SOLN
4.0000 mg | Freq: Once | INTRAVENOUS | Status: AC
  Administered 2020-05-26: 4 mg via INTRAVENOUS
  Filled 2020-05-26: qty 1

## 2020-05-26 MED ORDER — CEPHALEXIN 500 MG PO CAPS
500.0000 mg | ORAL_CAPSULE | Freq: Once | ORAL | Status: AC
  Administered 2020-05-26: 500 mg via ORAL
  Filled 2020-05-26: qty 1

## 2020-05-26 NOTE — ED Triage Notes (Signed)
C/o LLE and groin nerve pain. Chronic but worse last 24 hrs. Managed by pain MD.

## 2020-05-26 NOTE — ED Provider Notes (Signed)
And is [facility] DEPT Provider Note   CSN: [id number] Arrival date & time: 05/26/20  1720     History Chief Complaint  Patient presents with  . Leg Pain    [name] is a 70 y.o. male.  HPI   Patient presents to the ED with complaints of pain left groin going down his leg.  Patient has multiple medical problems including neuropathy, prior DVTs, and diabetes.  Patient has been bedbound for years.  Patient states he has chronic pain in his leg and has a history of neuropathy but the symptoms have been worse in the last day.  Patient states the pain goes down his left leg.  He denies any fevers or chills.  No vomiting or diarrhea.  No new leg swelling. Past Medical History:  Diagnosis Date  . Arthritis   . Atrial fibrillation ([place])    a. Dx 05/2014 in setting of seizure, PNA. S/p TEE/DCCV.  Marland Kitchen Bowel perforation ([place])    a. transvere and R colectomy and ileostomy in 12/25/13.  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 Deep vein thrombosis of right lower extremity ([place])   . Depression   . Diabetes mellitus without complication ([place])   . DVT (deep venous thrombosis) (HCC)    multiple times  . Hyperlipidemia   . Hyperplasia of prostate   . Hypertension   . Obesity   . Peripheral neuropathy   . PFO (patent foramen ovale)    a. 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  . Psoriasis   . Seizures ([place])   . Venous stasis   . Weakness of both legs     Patient Active Problem List   Diagnosis Date Noted  . 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 Venous stasis 07/11/2016  . Lower extremity edema 07/11/2016  . 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 Absence epileptic syndrome, not intractable, with status epilepticus ([place]) 07/05/2014  . Neuropathy involving both lower extremities 07/05/2014  . PFO  (patent foramen ovale)   . PAF (paroxysmal atrial fibrillation) ([place])   . Obesity   . Chronic anticoagulation 05/23/2014  . Atrial fibrillation with rapid ventricular response ([place]) 05/22/2014  . Seizures ([place]) 05/20/2014  . Acute respiratory failure with hypoxia ([place]) 05/20/2014  . Sepsis ([place]) 01/31/2014  . UTI (lower urinary tract infection) 01/30/2014  . Fever 01/30/2014  . Hyperglycemia 01/30/2014  . Free intraperitoneal air 12/25/2013  . Perforated viscus 12/25/2013  . Vitamin B 12 deficiency 12/18/2013  . Colon abnormality 12/18/2013  . Peripheral neuropathy 09/05/2013  . Hyperlipidemia   . Bimalleolar ankle fracture 09/01/2013  . History of DVT (deep vein thrombosis) 09/01/2013  . Ankle fracture 09/01/2013  . Fracture of ankle, closed 09/01/2013  . Internal hemorrhoids 10/06/2012  . External hemorrhoids 10/06/2012  . Anal skin tag 10/06/2012    Past Surgical History:  Procedure Laterality Date  . ANKLE FRACTURE SURGERY  09/04/13  . CARDIOVERSION N/A 05/24/2014   Procedure: CARDIOVERSION;  Surgeon: Josue Hector, MD;  [facility] END[facility]COPY;  Service: Cardiovascular;  Laterality: N/A;  . COLONOSCOPY  03/26/2012   Procedure: COLONOSCOPY;  Surgeon: Beryle Beams, MD;  Location: [facility] ENDOSCOPY;  Service: Endoscopy;  Laterality: N/A;  . COLOSTOMY  12-25-13   Ruptured colon  . FLEXIBLE SIGMOIDOSCOPY N/A 12/09/2013   Procedure: FLEXIBLE SIGMOIDOSCOPY;  Surgeon: Beryle Beams, MD;  Location:  [facility] ENDOSCOPY;  Service: Endoscopy;  Laterality: N/A;  . LAPAROTOMY N/A 12/25/2013   Procedure: EXPLORATORY LAPAROTOMY/Transverse and right coloectomy/ end  ileostomy and mucus  fistula;  Surgeon: Ralene Ok, MD;  Location: [facility];  Service: General;  Laterality: N/A;  . TEE WITHOUT CARDIOVERSION N/A 05/24/2014   Procedure: TRANSESOPHAGEAL ECHOCARDIOGRAM (TEE);  Surgeon: Josue Hector, MD;  Location[facility] ENDOSCOPY;  Service: Cardiovascular;  Laterality: N/A;  . TONSILLECTOMY     as child        Family History  Problem Relation Age of Onset  . Vision loss Father   . Hypertension Father   . Diabetes Father   . Heart disease Father   . Cancer Father        colon  . Diabetes Mother   . Hypertension Mother   . Heart disease Mother   . Dementia Mother   . Cancer Mother        breast ca  . Diabetes Sister   . Hyperlipidemia Sister   . Hypertension Sister   . Diabetes Brother   . Kidney disease Brother   . Heart disease Brother     Social History   Tobacco Use  . Smoking status: Former Smoker    Types: Pipe    Quit date: 08/10/2013    Years since quitting: 6.7  . Smokeless tobacco: Never Used  Substance Use Topics  . Alcohol use: No    Alcohol/week: 0.0 standard drinks  . Drug use: No    Home Medications Prior to Admission medications   Medication Sig Start Date End Date Taking? Authorizing Provider  cephALEXin (KEFLEX) 500 MG capsule Take 1 capsule (500 mg total) by mouth 3 (three) times daily. 05/26/20  Yes Dorie Rank, MD  acetaminophen (TYLENOL) 500 MG tablet Take 500-1,000 mg by mouth every 6 (six) hours as needed for mild pain, moderate pain, fever or headache.    [provider]  ALPHA LIPOIC ACID PO Take 1 capsule by mouth daily.    [provider]  Calcium Carbonate-Vitamin D (CALCIUM 600+D) 600-400 MG-UNIT tablet Take 1 tablet by mouth daily.    [provider]  Cholecalciferol (VITAMIN D3 PO) Take 1 capsule by mouth daily.    [provider]  ciprofloxacin (CIPRO) 500 MG tablet Take 500 mg by mouth 2 (two) times daily. For UTI - 7 days    [provider]  diltiazem (CARDIZEM CD) 240 MG 24 hr capsule Take 240 mg by mouth daily.    [provider]  doxepin (SINEQUAN) 75 MG capsule Take 150 mg by mouth at bedtime.     [provider]  DULoxetine (CYMBALTA) 30 MG capsule Take 30 mg by mouth daily. Pt takes with a 60mg  capsule.    [provider]  DULoxetine (CYMBALTA) 60 MG capsule  Take 60 mg by mouth daily. Pt takes with a 30mg  capsule.    [provider]  fenofibrate 160 MG tablet Take 160 mg by mouth daily.    [provider]  gabapentin (NEURONTIN) 600 MG tablet TAKE 1 TABLET BY MOUTH AT LUNCH AND TAKE 2 TABLETS BY MOUTH AT [facility] 12/19/19   Dohmeier, Asencion Partridge, MD  glipiZIDE (GLUCOTROL XL) 2.5 MG 24 hr tablet Take 2.5 mg by mouth in the morning and at bedtime.  05/16/17   [provider]  HYDROcodone-acetaminophen (NORCO/VICODIN) 5-325 MG tablet Up to twice a day as needed. 05/03/20   Sater, Nanine Means, MD  lamoTRIgine (LAMICTAL) 150 MG tablet TAKE  1 TABLET BY MOUTH TWICE A DAY 11/15/19   Dohmeier, Asencion Partridge, MD  levETIRAcetam (KEPPRA) 750 MG tablet Take 2 tablets (1,500 mg total) by mouth 2 (two) times daily. 04/10/20   Dohmeier, Asencion Partridge, MD  lidocaine-prilocaine (EMLA) cream Apply 1 application topically 2 (two) times daily at 8 am and 10 pm. Area of neuropathic pain. on intact skin. 01/19/20   Dohmeier, Asencion Partridge, MD  LORazepam (ATIVAN) 1 MG tablet Use 1 mg tab to break focal seizure activity. As needed, do not exceed 2 mg a day. 10/03/14   Dohmeier, Asencion Partridge, MD  losartan (COZAAR) 25 MG tablet Take 25 mg by mouth 2 (two) times daily.  05/07/17   [provider]  metFORMIN (GLUCOPHAGE-XR) 500 MG 24 hr tablet 500 mg 2 (two) times daily. 05/21/17   [provider]  Multiple Vitamin (MULTIVITAMIN WITH MINERALS) TABS Take 1 tablet by mouth daily.    [provider]  Omega-3 Fatty Acids (FISH OIL) 1000 MG CAPS Take 1,000 mg by mouth 2 (two) times a day.    [provider]  PRADAXA 150 MG CAPS capsule TAKE 1 CAPSULE (150 MG TOTAL) BY MOUTH 2 (TWO) TIMES DAILY. LABS NEEDED FOR FURTHER REFILLS 01/02/20   Pixie Casino, MD    Allergies    Penicillins  Review of Systems   Review of Systems  All other systems reviewed and are negative.   Physical Exam Updated Vital Signs BP (!) 147/80   Pulse 88   Temp 97.8 F (36.6 C) (Oral)    Resp 17   SpO2 93%   Physical Exam Vitals and nursing note reviewed.  Constitutional:      Appearance: He is well-developed and well-nourished.     Comments: Morbidly obese  HENT:     Head: Normocephalic and atraumatic.     Right Ear: External ear normal.     Left Ear: External ear normal.  Eyes:     General: No scleral icterus.       Right eye: No discharge.        Left eye: No discharge.     Conjunctiva/sclera: Conjunctivae normal.  Neck:     Trachea: No tracheal deviation.  Cardiovascular:     Rate and Rhythm: Normal rate and regular rhythm.     Pulses: Intact distal pulses.  Pulmonary:     Effort: Pulmonary effort is normal. No respiratory distress.     Breath sounds: Normal breath sounds. No stridor. No wheezing or rales.  Abdominal:     General: Bowel sounds are normal. There is no distension.     Palpations: Abdomen is soft.     Tenderness: There is no abdominal tenderness. There is no guarding or rebound.  Genitourinary:    Comments: Decubitus ulcer, small amount of bleeding, large amount of skin debris, malodorous Musculoskeletal:        General: No tenderness.     Cervical back: Neck supple.     Right lower leg: Edema present.     Left lower leg: Edema present.     Comments: Bilateral edema, no focal areas of swelling and redness  Skin:    General: Skin is warm and dry.     Findings: Rash present.     Comments: Multiple areas of scaling patches of skin consistent with his history of psoriasis  Neurological:     Mental Status: He is alert.     Cranial Nerves: No cranial nerve deficit (no facial droop, extraocular movements intact, no slurred speech).  Sensory: Sensory deficit present.     Motor: Weakness present. No abnormal muscle tone or seizure activity.     Coordination: Coordination normal.     Deep Tendon Reflexes: Strength normal.     Comments: Patient unable to move his lower extremities, atrophy noted, patient does have good strength upper  extremities  Psychiatric:        Mood and Affect: Mood and affect normal.     ED Results / Procedures / Treatments   Labs (all labs ordered are listed, but only abnormal results are displayed) Labs Reviewed  BASIC METABOLIC PANEL - Abnormal; Notable for the following components:      Result Value   Glucose, Bld 132 (*)    Calcium 8.8 (*)    All other components within normal limits  URINALYSIS, ROUTINE W REFLEX MICROSCOPIC - Abnormal; Notable for the following components:   APPearance HAZY (*)    Hgb urine dipstick LARGE (*)    Nitrite POSITIVE (*)    Leukocytes,Ua MODERATE (*)    WBC, UA >50 (*)    Bacteria, UA MANY (*)    All other components within normal limits  URINE CULTURE  CBC WITH DIFFERENTIAL/PLATELET    EKG None  Radiology No results found.  Procedures Procedures (including critical care time)  Medications Ordered in ED Medications  cephALEXin (KEFLEX) capsule 500 mg (has no administration in time range)  morphine 4 MG/ML injection 4 mg (4 mg Intravenous Given 05/26/20 1904)  morphine 4 MG/ML injection 4 mg (4 mg Intravenous Given 05/26/20 2031)    ED Course  I have reviewed the triage vital signs and the nursing notes.  Pertinent labs & imaging results that were available during my care of the patient were reviewed by me and considered in my medical decision making (see chart for details).  Clinical Course as of 05/26/20 2103  Sat May 26, 2020  1747 33 days hydrocodone 12/24 [JK]  2007 Laboratory tests are unremarkable.  CBC and metabolic panel are normal [JK]  2058 Urinalysis does suggest UTI.  We will plan on discharge home with antibiotics [JK]    Clinical Course User Index [JK] Dorie Rank, MD   MDM Rules/Calculators/A&P                          Patient presented to ED with complaints of leg pain.  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  ED work-up is overall reassuring.  No signs of acute infection in his leg.  I doubt acute  vascular compromise.  Doubt DVT.  Patient's urinalysis does suggest a UTI.  He also does have a decubitus ulcer on the buttock but I do not feel this is the main source of his pain.  He already uses barrier creams.  He said dressings have not been helpful in the past because of the fragility of the patient's skin.  Patient has improved with pain medications.  Plan on discharge home with a course of antibiotics. Final Clinical Impression(s) / ED Diagnoses Final diagnoses:  Left leg pain  Pressure injury of buttock, stage 2, unspecified laterality ([place])  Acute cystitis without hematuria    Rx / DC Orders ED Discharge Orders         Ordered    cephALEXin (KEFLEX) 500 MG capsule  3 times daily        05/26/20 2101           Dorie Rank, MD 05/26/20 2104

## 2020-05-26 NOTE — ED Notes (Signed)
[facility] called to transport the pt home. Wife at bedside.

## 2020-05-26 NOTE — Discharge Instructions (Addendum)
Continue medications for pain at home.  Follow-up with your primary care doctor.  Take the antibiotics as prescribed.  Return for fevers chills worsening symptoms.

## 2020-05-29 LAB — URINE CULTURE: Culture: >=100000 — AB

## 2020-05-30 ENCOUNTER — Telehealth: Payer: Self-pay | Admitting: Emergency Medicine

## 2020-05-30 NOTE — Telephone Encounter (Signed)
Post ED Visit - Positive Culture Follow-up  Culture report reviewed by antimicrobial stewardship pharmacist: [place] Team []  Elenor Quinones, Pharm.D. []  Heide Guile, Pharm.D., BCPS AQ-ID []  Parks Neptune, Pharm.D., BCPS []  Alycia Rossetti, Pharm.D., BCPS []  [place], Pharm.D., BCPS, AAHIVP []  Legrand Como, Pharm.D., BCPS, AAHIVP []  Salome Arnt, PharmD, BCPS []  Johnnette Gourd, PharmD, BCPS []  Hughes Better, PharmD, BCPS []  Leeroy Cha, PharmD []  Laqueta Linden, PharmD, BCPS []  Albertina Parr, PharmD  [place] Team []  Leodis Sias, PharmD []  Lindell Spar, PharmD []  Royetta Asal, PharmD []  Graylin Shiver, Rph []  Rema Fendt) Glennon Mac, PharmD []  Arlyn Dunning, PharmD []  Netta Cedars, PharmD []  Dia Sitter, PharmD []  Leone Haven, PharmD []  Gretta Arab, PharmD []  Theodis Shove, PharmD []  Peggyann Juba, PharmD []  Reuel Boom, PharmD Jimmy Footman PharmD   Positive urine culture Treated with cephalexin, organism sensitive to the same and no further patient follow-up is required at this time.  Hazle Nordmann 05/30/2020, 8:44 AM

## 2020-07-01 ENCOUNTER — Encounter: Payer: Self-pay | Admitting: Neurology

## 2020-07-03 ENCOUNTER — Other Ambulatory Visit: Payer: Self-pay | Admitting: Neurology

## 2020-07-03 DIAGNOSIS — G629 Polyneuropathy, unspecified: Secondary | ICD-10-CM

## 2020-07-03 DIAGNOSIS — G6289 Other specified polyneuropathies: Secondary | ICD-10-CM

## 2020-07-03 DIAGNOSIS — G5793 Unspecified mononeuropathy of bilateral lower limbs: Secondary | ICD-10-CM

## 2020-07-04 DIAGNOSIS — I4891 Unspecified atrial fibrillation: Secondary | ICD-10-CM

## 2020-07-04 DIAGNOSIS — Z86718 Personal history of other venous thrombosis and embolism: Secondary | ICD-10-CM

## 2020-07-04 DIAGNOSIS — G6289 Other specified polyneuropathies: Secondary | ICD-10-CM

## 2020-07-04 DIAGNOSIS — G5793 Unspecified mononeuropathy of bilateral lower limbs: Secondary | ICD-10-CM

## 2020-07-04 DIAGNOSIS — Q211 Atrial septal defect: Secondary | ICD-10-CM

## 2020-07-04 DIAGNOSIS — Q2112 Patent foramen ovale: Secondary | ICD-10-CM

## 2020-07-05 ENCOUNTER — Other Ambulatory Visit: Payer: Self-pay

## 2020-07-09 MED ORDER — HYDROCODONE-ACETAMINOPHEN 5-325 MG PO TABS: ORAL_TABLET | ORAL | 0 refills | Status: DC

## 2020-07-30 ENCOUNTER — Telehealth: Payer: Self-pay | Admitting: Neurology

## 2020-07-30 NOTE — Telephone Encounter (Signed)
[place] Spoke to patient's Sister.    Good Morning [name] I have a Up date on your pain Mgt. Referral . Your apt  08/29/2020  Arrive at 2:15 pm . With Audria Nine . Your sister is aware of apt.   Thanks So Much please keep your follow apt. Have a good Day

## 2020-08-02 ENCOUNTER — Telehealth (INDEPENDENT_AMBULATORY_CARE_PROVIDER_SITE_OTHER): Payer: Medicare Other | Admitting: Family Medicine

## 2020-08-02 ENCOUNTER — Encounter: Payer: Self-pay | Admitting: Family Medicine

## 2020-08-02 DIAGNOSIS — G40909 Epilepsy, unspecified, not intractable, without status epilepticus: Secondary | ICD-10-CM

## 2020-08-02 DIAGNOSIS — G6289 Other specified polyneuropathies: Secondary | ICD-10-CM

## 2020-08-02 MED ORDER — LEVETIRACETAM 750 MG PO TABS: 1500.0000 mg | ORAL_TABLET | Freq: Two times a day (BID) | ORAL | 1 refills | Status: DC

## 2020-08-02 MED ORDER — LAMOTRIGINE 150 MG PO TABS: 150.0000 mg | ORAL_TABLET | Freq: Two times a day (BID) | ORAL | 3 refills | Status: DC

## 2020-08-02 NOTE — Progress Notes (Signed)
PATIENT: [name] DOB: [birth year]  REASON FOR VISIT: follow up HISTORY FROM: patient  Virtual Visit via Telephone Note  I connected with [name] on 08/02/20 at 11:30 AM EDT by telephone and verified that I am speaking with the correct person using two identifiers.   I discussed the limitations, risks, security and privacy concerns of performing an evaluation and management service by telephone and the availability of in person appointments. I also discussed with the patient that there may be a patient responsible charge related to this service. The patient expressed understanding and agreed to proceed.   History of Present Illness:  08/02/20 ALL:  He returns for follow up for seizures and polyneuropathy. He is doing about the same. No significant changes. No seizures. He continues to have waxing and waning pain. Mostly of groin. Gabapentin 600/600/1200 helps some. he is tolerating lamotrigine and levetiracetam. He has an appt with pain management at [facility] 08/29/20.    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 Immobile for over 15 years-  former patient of Dr.Love. he is fragile and has to come by ambulance to each outpatient visit.     04/26/2019 ALL:  [name] is a 70 y.o. male here today for follow up for seizures and neuropathy. He continues Lamictal 150mg  twice daily as well as levetiracetam 1500mg  twice daily. Neuropathy stable on gabapentin 600mg  in am and 1200mg  at bedtime.  He denies any seizure activity since last being seen.  He is currently being followed by [facility] as he is completely bedridden.  Recent lab work has been reviewed for today's visit and is appropriate.    History (copied from Dr Dohmeier's note on 11/03/2018)  Interval history from 11-03-2018: [name] has been a longtime established patient at [facility] and was originally followed by Dr. Jeneen Rinks love.  He has a progressive motor neuropathy  that also causes pain.  He suffers from paroxysmal atrial fibrillation, he is meanwhile immobile and he developed a seizure disorder which has been treated and controlled.  The patient lives in the private home with his sister who is his main caregiver.  For several years he did not have a primary care physician but now is established with Dr. Waymon Budge, MD at [facility] He underwent recently as a new patient to complete the work-up which included the level of his antiseizure medication including Keppra and Neurontin, vitamin levels and an EKG.  He remains having paroxysmal atrial fibrillation and sees Dr. Debara Pickett.  His Keppra level so I was informed was 24, he was told to hydrate better and drink a lot of water, he was also told to start a vitamin D supplement and discontinue vitamin B12.  The patient is well-groomed alert oriented and conversational today he keeps adjusting his bed when he is in pain trying to find a more comfortable position but overall his pain level has been the same for many years and his degree of disability has not progressed recently.  He is nonambulatory.  He does have just recovered from a sinusitis and has a little nasal speech but he is not hoarse, and he does not have trouble swallowing he reports. There is no diplopia no facial asymmetry seen.   HISTORY OF PRESENT ILLNESS: CD-02-2017 Mr [name] is a 70 year old male patient with a progressive neuropathy that left him unable to ambulate and now is bedridden. He has seizures- and none recently. Possibly one absence seizure  6 month ago. He has a lot of re activated psoriasis. Hydrocodone helped his pain and jerkiness- he no longer takes Lyrica, and he now takes OTC supplements. He uses Magnilife - a magnesium based supplement that helps his constipation as well. He has a Teacher, music.  [name] neuropathy and is becoming dependent on his stretcher followed by an abdominal surgery actually a peritonitis is ruptured  colon.  My colleagues at [facility] were kind enough to evaluate [name] and thought that this reflects a Dilantin induced polyneuropathy but this should actually improve and the patient now has not been on Dilantin for about 12 months and there has been no improvement noted neither and motor no sensory function. 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  MM- [name] is a 70 year old male with a history of seizures and neuropathy .  He returns today for an evaluation.  His sister is with him today.  She reports that he may have an Absence seizures every 3-4 months.  She states that he just zones out when they are talking to him.  He remains on Lamictal and Keppra and is tolerating that well.  He is now off of Dilantin.  He continues to have neuropathy pain particularly in the groin area.  The patient is bedridden.  He is unable to sit up without significant discomfort.  He reports that Cymbalta helps him with his neuropathy does not help much with the pain.  He is also on gabapentin and Lyrica.  Lyrica is prescribed by another provider.  He is unsure that these medications are offering him much benefit.  At the last visit Dr. Brett Fairy gave him a prescription of hydrocodone and he reports that this is offered him the most benefit.   He is requesting to take the medication twice a day versus once a day.  He returns today for an evaluation.  HISTORY see [name] today, a 70 year old Caucasian gentleman with advanced muscular degeneration, he is presenting on a stretcher. He as years ago lost all his deep tendon reflexes, has often pain at the joint especially in the lower extremities at the ankle on the right for example. Muscle atrophy has caused him to have flaccid muscles. Deep tendon reflexes also missing from the upper extremities.An appointment with neuromuscular consultant is requested - this patient has no diagnosis for the cause of his disability.   Begun in lower extremities , first cane than walker, 2012 . Since than progressed to stretcher. Has a now a colostomy, has no core strength, has some residual foot and arm movement. DVTs from immobility.  He continues to present also as a seizure patient who also presents with a seizure disorder, manifesting as absence seizures, not GTC. He has been on Dilantin for a long time but my goal is to wean him slowly off and exchange with another seizure medication. He also presents today with a really high blood pressure.The patient had been started on Lamictal 100 mg twice a day and his sister confirms that his spells seem to have decreased in frequency. He remains on Keppra, which I will also decrease to 1500 mg bid from tid dosing.  I have increased his Lamictal today 250 mg twice a day and I am starting to wean him off Dilantin 30 days on 200 mg, followed by another 30 days on 100 mg at night only then discontinue. The goal is to eliminate Dilantin as a contributor to neuropathy. He will have blood  drawn today for CMET, CBC, dilantin level .  The pain specialist added that he had nothing to offer above or beyond what she is already on. He has followed up with several pain specialists was prescribed hydrocodone APAP and this helps him at night with the pain - this farthe very best.  Since his sister manages the pain medication for him I think that the occasional hydrocodone APAP at night would out weigh the risks associated with the patient( he isimmobile, has a history of somewhat decreased respirations, morbid obesity, sleep apnea) but is in believable pain..  Interval history on 07-05-14. [name] was admitted in status epilepticus on 05/17/2014 to the [facility] and followed by Dr. Cruzita Lederer,  He underwent multiple multiple brain scans which did not show an intracranial abnormality it was also noted that he was unable to swallow any medications during his status and therefore  received Dilantin and Keppra intravenously. He was intubated for a couple of days. The status epilepticus was broken by being given IV medications. He is back on Dilantin and I have increased that in his last visit to 400 mg at night instead of alternating 300 with 400 we will resume this today. I refill his Keppra today as well as his Neurontin which I further increased by 600 mg. Neurontin also an antiepileptic medication is used in his case to treat his significant sensory abnormalities related to a neuropathy. What is remarkable to me is that the patient has almost no control over his lower extremities and lost all muscle tone by his upper extremities provide good grip strength and normal movement and tone. A nerve conduction study and EMG with Dr. Jannifer Franklin just documented neuropathy. The admission to the hospital was precipitated by developing pneumonia. The CT scan of the head was reviewed and the report is copied to today's note. I have discussed with the patient today that I would like for him to undergo a thoracic spine MRI if possible his last brain MRI by my records is from 2004, ordered by Dr. Jannifer Franklin at the time. In addition I do not need a brain MRI at this point I think that it is clear that the pneumonia cost him to have the seizures which is not an unknown or uncommon trigger. Any infection below the seizure threshold. What I would like to research further why his lower extremities are disproportionately affected by a neuropathy.  Interval history from 10-03-14. She no longer has physical therapy provided through [facility] payment, he and his sister are using some weight exercises at home. He has also healed from this macerated skin near the colostomy,. A honey-based product has been helpful in establishing better nor skin. Dr. Cornelious Bryant thousand patient and exchanged his nocturnal time dose of gabapentin with doxepin. This helped him to sleep. Does not help the pain. He also has a rectal pain pulled  endorsed neuropathy. I would like for Dr. Cornelious Bryant to have another visit with the patient also wondered if she couldperform any kind of study on the pudendal nerve?.CD  Interval history from 10/02/2015, [name] has not had any significant seizure activity over the last 12 months at least. His main problem is that due to his immobility and venostasis he has painful neuropathy, venostasis and leg ulcers.   Observations/Objective:  Generalized: Well developed, in no acute distress, bedridden  Mentation: Alert oriented to time, place, history taking. Follows all commands speech and language fluent   Assessment and Plan:  70 y.o. year old  male  has a past medical history of Arthritis, Atrial fibrillation ([place]), Bowel perforation ([facility]), Deep vein thrombophlebitis of left leg ([place]), Deep vein thrombosis of right lower extremity ([place]), Depression, Diabetes mellitus without complication ([place]), DVT (deep venous thrombosis) ([place]), Hyperlipidemia, Hyperplasia of prostate, Hypertension, Obesity, Peripheral neuropathy, PFO (patent foramen ovale), Psoriasis, Seizures ([place]), Venous stasis, and Weakness of both legs. here with    ICD-10-CM   1. 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  Seizures are very well managed on current regimen of Lamictal 150 mg twice daily and levetiracetam 1500 mg twice daily.  He continues gabapentin 600 mg in the a.m., 600 at lunch and 1200 mg at bedtime with some improvement in neuropathy pain.  He will continue close follow-up with his primary care provider.  Activity as tolerated.  He will follow-up with [place] in 6 months, sooner if needed.  He verbalizes understanding and agreement with this plan.  No orders of the defined types were placed in this encounter.   Meds ordered this encounter  Medications  . lamoTRIgine (LAMICTAL) 150 MG tablet    Sig: Take 1 tablet (150 mg total) by  mouth 2 (two) times daily.    Dispense:  180 tablet    Refill:  3    Order Specific Question:   Supervising Provider    Answer:   Melvenia Beam [id number]  . levETIRAcetam (KEPPRA) 750 MG tablet    Sig: Take 2 tablets (1,500 mg total) by mouth 2 (two) times daily.    Dispense:  360 tablet    Refill:  1    Order Specific Question:   Supervising Provider    Answer:   Melvenia Beam [id number]     Follow Up Instructions:  I discussed the assessment and treatment plan with the patient. The patient was provided an opportunity to ask questions and all were answered. The patient agreed with the plan and demonstrated an understanding of the instructions.   The patient was advised to call back or seek an in-person evaluation if the symptoms worsen or if the condition fails to improve as anticipated.  I provided 15 minutes of non-face-to-face time during this encounter.  Patient is located at his place of residence.  His sister aids in history.  Provider is in the office.   Debbora Presto, NP   Made any corrections needed, and agree with history, physical, neuro exam,assessment and plan as stated.     Sarina Ill, MD [facility]

## 2020-08-13 ENCOUNTER — Other Ambulatory Visit: Payer: Self-pay | Admitting: Neurology

## 2020-08-14 MED ORDER — HYDROCODONE-ACETAMINOPHEN 5-325 MG PO TABS: ORAL_TABLET | ORAL | 0 refills | Status: DC

## 2020-09-26 ENCOUNTER — Other Ambulatory Visit: Payer: Self-pay | Admitting: Internal Medicine

## 2020-09-26 DIAGNOSIS — I480 Paroxysmal atrial fibrillation: Secondary | ICD-10-CM

## 2020-09-26 NOTE — Telephone Encounter (Signed)
Pradaxa refill requested  Age 71 Weight: unknown, previously 300# Height unknown Indication: PAF SCR: 0.65

## 2020-09-26 NOTE — Telephone Encounter (Signed)
Called and spoke w/pt's sister and they stated he is bed bound and impossible to weigh so I will route to pharmd pool as I am not comfortable refilling without a recent weight and the pharmds can make that judgement call.

## 2020-10-30 ENCOUNTER — Encounter: Payer: Self-pay | Admitting: Neurology

## 2020-11-01 MED ORDER — LOSARTAN POTASSIUM 25 MG PO TABS: 25.0000 mg | ORAL_TABLET | Freq: Two times a day (BID) | ORAL | 1 refills | Status: DC

## 2020-11-26 ENCOUNTER — Telehealth: Payer: Medicare Other | Admitting: Internal Medicine

## 2020-12-24 ENCOUNTER — Other Ambulatory Visit: Payer: Self-pay | Admitting: Internal Medicine

## 2020-12-27 ENCOUNTER — Telehealth (INDEPENDENT_AMBULATORY_CARE_PROVIDER_SITE_OTHER): Payer: Medicare Other | Admitting: Internal Medicine

## 2020-12-27 ENCOUNTER — Encounter: Payer: Self-pay | Admitting: Internal Medicine

## 2020-12-27 VITALS — BP 125/67 | HR 80 | Temp 98.2°F

## 2020-12-27 DIAGNOSIS — R600 Localized edema: Secondary | ICD-10-CM | POA: Diagnosis not present

## 2020-12-27 DIAGNOSIS — Z7901 Long term (current) use of anticoagulants: Secondary | ICD-10-CM | POA: Diagnosis not present

## 2020-12-27 DIAGNOSIS — I480 Paroxysmal atrial fibrillation: Secondary | ICD-10-CM | POA: Diagnosis not present

## 2020-12-27 DIAGNOSIS — E782 Mixed hyperlipidemia: Secondary | ICD-10-CM

## 2020-12-27 NOTE — Progress Notes (Signed)
Virtual Visit via Video Note   This visit type was conducted due to national recommendations for restrictions regarding the COVID-19 Pandemic (e.g. social distancing) in an effort to limit this patient's exposure and mitigate transmission in our community.  Due to his co-morbid illnesses, this patient is at least at moderate risk for complications without adequate follow up.  This format is felt to be most appropriate for this patient at this time.  All issues noted in this document were discussed and addressed.  A limited physical exam was performed with this format.  Please refer to the patient's chart for his consent to telehealth for [facility].   Evaluation Performed:  Doximity video visit  Date:  12/27/2020   ID:  [name], DOB [birth year], MRN [id number]  Patient Location:  [place] [id number]  Provider location:   [address], [address] [address] [place], [place] [address]  PCP:  Merlene Laughter, MD  Cardiologist:  Pixie Casino, MD Electrophysiologist:  None   Chief Complaint:  Leg pain  History of Present Illness:    [name] is a 70 y.o. male who presents via audio/video conferencing for a telehealth visit today.  [name] was seen today via Doximity video visit. He is chronically bedbound due to neuropathy. He has had problems with chronic leg edema which has improved. Recently he has noted an irregular heartbeat and an EKG by a home health doctor seems to have confirmed afib - he has known PAF on Pradaxa. Labs stable recently - he was noted to have elevated triglycerides and changed to a fibrate and fish oil. He denies chest pain, DOE or symptomatic atrial fibrillation.  11/21/2019  [name] was seen today for video visit.  This works very well to him because he is chronically bedbound due to neuropathy.  He has persistent lower extremity edema and atrial fibrillation which seems more longstanding persistent.  He is on Pradaxa.   In November he had a UTI and was noted be tachycardic however that resolved again after treatment.  He is apparently prone to UTIs and has had several recently.  He currently has a UTI of her heart rate was 85 today and blood pressure is well controlled.  He continues to have care with doctors making house calls who does labs every few months.  The family reports no worsening anemia and normal renal function.  He denies any bleeding difficulty on Pradaxa.  His triglycerides have been elevated in 3-400 range however after being started on fish oils and fenofibrate are now down in the 200s.  12/27/2020  [name] is seen today in follow-up.  Overall he seems to be fairly stable.  Back in January he was in the ER with pretty extreme pain in his leg.  He was also noted to have a UTI which probably set that off.  He received pain medicine and antibiotic treatments.  Since then he has been doing pretty well.  He is probably in persistent A. fib.  Is not had an EKG since 2020 however his blood pressure cuff shows irregularities.  Blood pressure is well controlled.  PCP had checked his cholesterol recently which showed his numbers to be generally in range except for triglycerides however they were somewhat lower according to his wife.  The patient does not have symptoms concerning for COVID-19 infection (fever, chills, cough, or new SHORTNESS OF BREATH).    Prior CV studies:   The following studies were  reviewed today:  Chart review  PMHx:  Past Medical History:  Diagnosis Date   Arthritis    Atrial fibrillation ([place])    a. Dx 05/2014 in setting of seizure, PNA. S/p TEE/DCCV.   Bowel perforation ([place])    a. transvere and R colectomy and ileostomy in 12/25/13.   Deep vein thrombophlebitis of left leg (HCC)    Deep vein thrombosis of right lower extremity (HCC)    Depression    Diabetes mellitus without complication (HCC)    DVT (deep venous thrombosis) (HCC)    multiple times   Hyperlipidemia     Hyperplasia of prostate    Hypertension    Obesity    Peripheral neuropathy    PFO (patent foramen ovale)    a. 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   Psoriasis    Seizures (HCC)    Venous stasis    Weakness of both legs     Past Surgical History:  Procedure Laterality Date   ANKLE FRACTURE SURGERY  09/04/13   CARDIOVERSION N/A 05/24/2014   Procedure: CARDIOVERSION;  Surgeon: Josue Hector, MD;  Location: [facility];  Service: Cardiovascular;  Laterality: N/A;   COLONOSCOPY  03/26/2012   Procedure: COLONOSCOPY;  Surgeon: Beryle Beams, MD;  Location: [facility];  Service: Endoscopy;  Laterality: N/A;   COLOSTOMY  12-25-13   Ruptured colon   FLEXIBLE SIGMOIDOSCOPY N/A 12/09/2013   Procedure: FLEXIBLE SIGMOIDOSCOPY;  Surgeon: Beryle Beams, MD;  Location: [facility];  Service: Endoscopy;  Laterality: N/A;   LAPAROTOMY N/A 12/25/2013   Procedure: EXPLORATORY LAPAROTOMY/Transverse and right coloectomy/ end  ileostomy and mucus  fistula;  Surgeon: Ralene Ok, MD;  Location: [facility];  Service: General;  Laterality: N/A;   TEE WITHOUT CARDIOVERSION N/A 05/24/2014   Procedure: TRANSESOPHAGEAL ECHOCARDIOGRAM (TEE);  Surgeon: Josue Hector, MD;  Location: [facility];  Service: Cardiovascular;  Laterality: N/A;   TONSILLECTOMY     as child    FAMHx:  Family History  Problem Relation Age of Onset   Vision loss Father    Hypertension Father    Diabetes Father    Heart disease Father    Cancer Father        colon   Diabetes Mother    Hypertension Mother    Heart disease Mother    Dementia Mother    Cancer Mother        breast ca   Diabetes Sister    Hyperlipidemia Sister    Hypertension Sister    Diabetes Brother    Kidney disease Brother    Heart disease Brother     SOCHx:   reports that he quit smoking about 7 years ago. His smoking use included pipe. He has never used smokeless tobacco. He reports that he does not drink  alcohol and does not use drugs.  ALLERGIES:  Allergies  Allergen Reactions   Penicillins Other (See Comments)    Pt has no personal reaction to this med, but his mother/brother do. Has patient had a PCN reaction causing immediate rash, facial/tongue/throat swelling, SOB or lightheadedness with hypotension: No Has patient had a PCN reaction causing severe rash involving mucus membranes or skin necrosis: No Has patient had a PCN reaction that required hospitalization No Has patient had a PCN reaction occurring within the last 10 years: No If all of the above answers are "NO", then may proceed with Cephalosporin    MEDS:  Current Meds  Medication Sig   acetaminophen (TYLENOL) 500 MG tablet Take 500-1,000 mg by mouth every 6 (six) hours as needed for mild pain, moderate pain, fever or headache.   ALPHA LIPOIC ACID PO Take 1 capsule by mouth daily.   atorvastatin (LIPITOR) 10 MG tablet Take 10 mg by mouth at bedtime.   Calcium Carbonate-Vitamin D 600-400 MG-UNIT tablet Take 1 tablet by mouth daily.   Cholecalciferol (VITAMIN D3 PO) Take 1 capsule by mouth daily.   dabigatran (PRADAXA) 150 MG CAPS capsule Take 1 capsule (150 mg total) by mouth 2 (two) times daily.   diltiazem (CARDIZEM CD) 240 MG 24 hr capsule Take 240 mg by mouth daily.   doxepin (SINEQUAN) 75 MG capsule Take 150 mg by mouth at bedtime.    DULoxetine (CYMBALTA) 60 MG capsule Take 60 mg by mouth daily. Pt takes with a '30mg'$  capsule.   fenofibrate 160 MG tablet Take 160 mg by mouth daily.   gabapentin (NEURONTIN) 600 MG tablet TAKE 1 TABLET BY MOUTH AT LUNCH AND TAKE 2 TABLETS BY MOUTH AT DINNER   glipiZIDE (GLUCOTROL XL) 2.5 MG 24 hr tablet Take 2.5 mg by mouth in the morning and at bedtime.    lamoTRIgine (LAMICTAL) 150 MG tablet Take 1 tablet (150 mg total) by mouth 2 (two) times daily.   levETIRAcetam (KEPPRA) 750 MG tablet Take 2 tablets (1,500 mg total) by mouth 2 (two) times daily.   losartan (COZAAR) 25 MG tablet TAKE  1 TABLET BY MOUTH TWICE A DAY   metFORMIN (GLUCOPHAGE) 1000 MG tablet Take 1,000 mg by mouth 2 (two) times daily.   Multiple Vitamin (MULTIVITAMIN WITH MINERALS) TABS Take 1 tablet by mouth daily.   naloxone (NARCAN) nasal spray 4 mg/0.1 mL as needed.   Omega-3 Fatty Acids (FISH OIL) 1000 MG CAPS Take 1,000 mg by mouth 2 (two) times a day.   oxyCODONE-acetaminophen (PERCOCET) 10-325 MG tablet Take 1 tablet by mouth 5 (five) times daily as needed.     ROS: Pertinent items noted in HPI and remainder of comprehensive ROS otherwise negative.  Labs/Other Tests and Data Reviewed:    Recent Labs: 05/26/2020: BUN 13; Creatinine, Ser 0.65; Hemoglobin 13.9; Platelets 249; Potassium 4.2; Sodium 136   Recent Lipid Panel Lab Results  Component Value Date/Time   CHOL 136 10/17/2013 12:00 AM   TRIG 65 10/17/2013 12:00 AM   HDL 45 10/17/2013 12:00 AM   LDLCALC 78 10/17/2013 12:00 AM    Wt Readings from Last 3 Encounters:  06/14/15 300 lb (136.1 kg)  04/15/15 300 lb (136.1 kg)  10/24/14 280 lb (127 kg)     Exam:    Vital Signs:  BP 125/67   Pulse 80   Temp 98.2 F (36.8 C)    General appearance: alert, no distress and moderately obese Lungs: no visual respiratory difficulty Extremities: edema 1+ bilateral stasis edema Skin: pale Neurologic: Grossly normal  ASSESSMENT & PLAN:    PAF-maintaining sinus, CHADSVASC score of 3 History of DVT on Pradaxa Nonambulatory Dyslipidemia -specifically high triglycerides Bilateral lower extremity venous stasis edema  Mr. Rufenacht has been doing pretty well other than an episode of pain associated with UTI back in January.  He has had good seizure control.  He is tolerating anticoagulation.  His A. fib is rate controlled.  His cholesterol has been pretty good according his wife and his triglycerides have come down some.  No recommended changes to his medicines today.  Plan follow-up annually or sooner as necessary.  COVID-19 Education: The signs  and symptoms of COVID-19 were discussed with the patient and how to seek care for testing (follow up with PCP or arrange E-visit).  The importance of social distancing was discussed today.  Patient Risk:   After full review of this patients clinical status, I feel that they are at least moderate risk at this time.  Time:   Today, I have spent 25 minutes with the patient with telehealth technology discussing PAF, HTN, dyslipidemia, edema.     Medication Adjustments/Labs and Tests Ordered: Current medicines are reviewed at length with the patient today.  Concerns regarding medicines are outlined above.   Tests Ordered: No orders of the defined types were placed in this encounter.   Medication Changes: No orders of the defined types were placed in this encounter.   Disposition:  in 1 year(s)  Pixie Casino, MD, [facility] [place] Director of the Advanced Lipid Disorders &  Cardiovascular Risk Reduction Clinic Diplomate of the American Board of Clinical Lipidology Attending Cardiologist  Direct Dial: [phone]  Fax: [phone]  Website:  [email or web address]  Pixie Casino, MD  12/27/2020 8:21 AM

## 2020-12-27 NOTE — Patient Instructions (Signed)
Medication Instructions:  Your physician recommends that you continue on your current medications as directed. Please refer to the Current Medication list given to you today.  *If you need a refill on your cardiac medications before your next appointment, please call your pharmacy*   Follow-Up: At [facility], you and your health needs are our priority.  As part of our continuing mission to provide you with exceptional heart care, we have created designated Provider Care Teams.  These Care Teams include your primary Cardiologist (physician) and Advanced Practice Providers (APPs -  Physician Assistants and Nurse Practitioners) who all work together to provide you with the care you need, when you need it.  We recommend signing up for the patient portal called "MyChart".  Sign up information is provided on this After Visit Summary.  MyChart is used to connect with patients for Virtual Visits (Telemedicine).  Patients are able to view lab/test results, encounter notes, upcoming appointments, etc.  Non-urgent messages can be sent to your provider as well.   To learn more about what you can do with MyChart, go to [email or web address].    Your next appointment:   12 month(s)  The format for your next appointment:   Virtual Visit   Provider:   You may see Pixie Casino, MD or one of the following Advanced Practice Providers on your designated Care Team:   Almyra Deforest, PA-C Fabian Sharp, PA-C or  Roby Lofts, [place]   Other Instructions

## 2021-01-20 ENCOUNTER — Other Ambulatory Visit: Payer: Self-pay | Admitting: Internal Medicine

## 2021-02-04 ENCOUNTER — Telehealth: Payer: Medicare Other | Admitting: Neurology

## 2021-03-06 ENCOUNTER — Telehealth (INDEPENDENT_AMBULATORY_CARE_PROVIDER_SITE_OTHER): Payer: Medicare Other | Admitting: Neurology

## 2021-03-06 ENCOUNTER — Encounter: Payer: Self-pay | Admitting: Neurology

## 2021-03-06 DIAGNOSIS — R198 Other specified symptoms and signs involving the digestive system and abdomen: Secondary | ICD-10-CM | POA: Diagnosis not present

## 2021-03-06 DIAGNOSIS — Z7901 Long term (current) use of anticoagulants: Secondary | ICD-10-CM

## 2021-03-06 DIAGNOSIS — G40A01 Absence epileptic syndrome, not intractable, with status epilepticus: Secondary | ICD-10-CM

## 2021-03-06 DIAGNOSIS — Z86718 Personal history of other venous thrombosis and embolism: Secondary | ICD-10-CM | POA: Diagnosis not present

## 2021-03-06 DIAGNOSIS — G6289 Other specified polyneuropathies: Secondary | ICD-10-CM

## 2021-03-06 MED ORDER — LAMOTRIGINE 150 MG PO TABS: 150.0000 mg | ORAL_TABLET | Freq: Two times a day (BID) | ORAL | 3 refills | Status: DC

## 2021-03-06 MED ORDER — LEVETIRACETAM 750 MG PO TABS: 1500.0000 mg | ORAL_TABLET | Freq: Two times a day (BID) | ORAL | 1 refills | Status: DC

## 2021-03-06 MED ORDER — GABAPENTIN 600 MG PO TABS: ORAL_TABLET | ORAL | 0 refills | Status: DC

## 2021-03-06 NOTE — Progress Notes (Signed)
Virtual Visit via Video Note  I connected with [name] on 03/06/21 at  2:30 PM EDT by a video enabled telemedicine application and verified that I am speaking with the correct person using two identifiers.  Location: Patient: at his home  Provider: at her office, [facility]    I discussed the limitations of evaluation and management by telemedicine and the availability of in person appointments. The patient expressed understanding and agreed to proceed.  History of Present Illness: [name] has been for many decades treated here at [facility] and was in the past followed for primary care by Dr. Sol Blazing. The medical group she belonged to, namely [facility] will stop seeing patients at the private homes to concentrate on nursing homes and staff.  So a new primary care arrangement had to be found and [name] will now be followed by [facility] group, his providers there already manage his pain medication and fill his narcotic pain medication. 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    Observations/Objective: The patient presents on video reclined in the bed, his speech is slightly nasal and he has just overcome a sinus infection I learned.  He reports that his swallowing is not affected, he has not changed his sleep pattern, he has not changed his appetite.  There has been no ambulatory ability for many years, since his last visit was January there have been no seizure events either.  He still rates his pain 10 out of 10 tenderness all over sometimes burning sharp shooting into his legs and into his arms he localizes the onset of these shooting pains in the groin from there his whole lower extremity will be throbbing and burning down to the feet.  About 3 weeks ago he has overcome a UTI, he has been followed for psoriasis, he has a stoma and a sore spot around his stoma for which a wound care nurse visits  him.  The visiting RN has kept records of his blood pressure heart rate and temperature and these look all normal.  We are just needing to refill his medication for seizure especially.   Assessment and Plan:He continues Lamictal 150mg  twice daily as well as levetiracetam 1500mg  twice daily. Neuropathy stable on gabapentin 600mg  in am and 1200mg  at bedtime.  He denies any seizure activity since last being seen.  He is currently being followed by [facility] as he is completely bedridden.  Recent lab work has been reviewed for today's visit and is appropriate.     Follow Up Instructions: virtual visit with Np in 6 month.     I discussed the assessment and treatment plan with the patient. The patient was provided an opportunity to ask questions and all were answered. The patient agreed with the plan and demonstrated an understanding of the instructions.   The patient was advised to call back or seek an in-person evaluation if the symptoms worsen or if the condition fails to improve as anticipated.  I provided 16 minutes of non-face-to-face time during this encounter.   [name], MD

## 2021-06-17 ENCOUNTER — Other Ambulatory Visit: Payer: Self-pay | Admitting: Neurology

## 2021-06-17 DIAGNOSIS — G6289 Other specified polyneuropathies: Secondary | ICD-10-CM

## 2021-06-17 DIAGNOSIS — Z7901 Long term (current) use of anticoagulants: Secondary | ICD-10-CM

## 2021-06-17 DIAGNOSIS — Z86718 Personal history of other venous thrombosis and embolism: Secondary | ICD-10-CM

## 2021-06-17 DIAGNOSIS — G40A01 Absence epileptic syndrome, not intractable, with status epilepticus: Secondary | ICD-10-CM

## 2021-06-17 DIAGNOSIS — R198 Other specified symptoms and signs involving the digestive system and abdomen: Secondary | ICD-10-CM

## 2021-06-17 MED ORDER — GABAPENTIN 600 MG PO TABS: ORAL_TABLET | ORAL | 0 refills | Status: DC

## 2021-06-22 ENCOUNTER — Other Ambulatory Visit: Payer: Self-pay | Admitting: Internal Medicine

## 2021-06-22 DIAGNOSIS — I480 Paroxysmal atrial fibrillation: Secondary | ICD-10-CM

## 2021-08-23 ENCOUNTER — Encounter: Payer: Self-pay | Admitting: Neurology

## 2021-08-23 ENCOUNTER — Other Ambulatory Visit: Payer: Self-pay | Admitting: Neurology

## 2021-08-23 DIAGNOSIS — Z86718 Personal history of other venous thrombosis and embolism: Secondary | ICD-10-CM

## 2021-08-23 DIAGNOSIS — G40A01 Absence epileptic syndrome, not intractable, with status epilepticus: Secondary | ICD-10-CM

## 2021-08-23 DIAGNOSIS — Z7901 Long term (current) use of anticoagulants: Secondary | ICD-10-CM

## 2021-08-23 DIAGNOSIS — R198 Other specified symptoms and signs involving the digestive system and abdomen: Secondary | ICD-10-CM

## 2021-08-23 DIAGNOSIS — G6289 Other specified polyneuropathies: Secondary | ICD-10-CM

## 2021-08-26 ENCOUNTER — Other Ambulatory Visit: Payer: Self-pay | Admitting: Neurology

## 2021-08-26 MED ORDER — GABAPENTIN 600 MG PO TABS: ORAL_TABLET | ORAL | 1 refills | Status: DC

## 2021-09-16 ENCOUNTER — Other Ambulatory Visit: Payer: Self-pay | Admitting: Neurology

## 2021-09-16 DIAGNOSIS — R198 Other specified symptoms and signs involving the digestive system and abdomen: Secondary | ICD-10-CM

## 2021-09-16 DIAGNOSIS — G6289 Other specified polyneuropathies: Secondary | ICD-10-CM

## 2021-09-16 DIAGNOSIS — Z86718 Personal history of other venous thrombosis and embolism: Secondary | ICD-10-CM

## 2021-09-16 DIAGNOSIS — Z7901 Long term (current) use of anticoagulants: Secondary | ICD-10-CM

## 2021-09-16 DIAGNOSIS — G40A01 Absence epileptic syndrome, not intractable, with status epilepticus: Secondary | ICD-10-CM

## 2021-12-13 ENCOUNTER — Other Ambulatory Visit: Payer: Self-pay | Admitting: Neurology

## 2021-12-13 DIAGNOSIS — R198 Other specified symptoms and signs involving the digestive system and abdomen: Secondary | ICD-10-CM

## 2021-12-13 DIAGNOSIS — G40A01 Absence epileptic syndrome, not intractable, with status epilepticus: Secondary | ICD-10-CM

## 2021-12-13 DIAGNOSIS — G6289 Other specified polyneuropathies: Secondary | ICD-10-CM

## 2021-12-13 DIAGNOSIS — Z86718 Personal history of other venous thrombosis and embolism: Secondary | ICD-10-CM

## 2021-12-13 DIAGNOSIS — Z7901 Long term (current) use of anticoagulants: Secondary | ICD-10-CM

## 2021-12-19 ENCOUNTER — Other Ambulatory Visit: Payer: Self-pay | Admitting: Internal Medicine

## 2021-12-19 DIAGNOSIS — I480 Paroxysmal atrial fibrillation: Secondary | ICD-10-CM

## 2022-01-04 ENCOUNTER — Other Ambulatory Visit: Payer: Self-pay

## 2022-01-04 ENCOUNTER — Inpatient Hospital Stay (HOSPITAL_COMMUNITY)
Admission: EM | Admit: 2022-01-04 | Discharge: 2022-01-10 | DRG: 690 | Disposition: A | Discharge status: Home Health | Payer: Medicare Other | Attending: Internal Medicine | Admitting: Internal Medicine

## 2022-01-04 ENCOUNTER — Emergency Department (HOSPITAL_COMMUNITY): Payer: Medicare Other

## 2022-01-04 DIAGNOSIS — Z7901 Long term (current) use of anticoagulants: Secondary | ICD-10-CM

## 2022-01-04 DIAGNOSIS — N10 Acute tubulo-interstitial nephritis: Principal | ICD-10-CM | POA: Diagnosis present

## 2022-01-04 DIAGNOSIS — Z87440 Personal history of urinary (tract) infections: Secondary | ICD-10-CM

## 2022-01-04 DIAGNOSIS — Z83438 Family history of other disorder of lipoprotein metabolism and other lipidemia: Secondary | ICD-10-CM

## 2022-01-04 DIAGNOSIS — Z880 Allergy status to penicillin: Secondary | ICD-10-CM

## 2022-01-04 DIAGNOSIS — B964 Proteus (mirabilis) (morganii) as the cause of diseases classified elsewhere: Secondary | ICD-10-CM | POA: Diagnosis present

## 2022-01-04 DIAGNOSIS — Z932 Ileostomy status: Secondary | ICD-10-CM

## 2022-01-04 DIAGNOSIS — I509 Heart failure, unspecified: Secondary | ICD-10-CM | POA: Diagnosis present

## 2022-01-04 DIAGNOSIS — I480 Paroxysmal atrial fibrillation: Secondary | ICD-10-CM | POA: Diagnosis present

## 2022-01-04 DIAGNOSIS — N390 Urinary tract infection, site not specified: Secondary | ICD-10-CM | POA: Diagnosis not present

## 2022-01-04 DIAGNOSIS — E1142 Type 2 diabetes mellitus with diabetic polyneuropathy: Secondary | ICD-10-CM | POA: Diagnosis present

## 2022-01-04 DIAGNOSIS — E669 Obesity, unspecified: Secondary | ICD-10-CM | POA: Diagnosis present

## 2022-01-04 DIAGNOSIS — F419 Anxiety disorder, unspecified: Secondary | ICD-10-CM | POA: Diagnosis present

## 2022-01-04 DIAGNOSIS — Z79899 Other long term (current) drug therapy: Secondary | ICD-10-CM

## 2022-01-04 DIAGNOSIS — Z6837 Body mass index (BMI) 37.0-37.9, adult: Secondary | ICD-10-CM

## 2022-01-04 DIAGNOSIS — Z833 Family history of diabetes mellitus: Secondary | ICD-10-CM

## 2022-01-04 DIAGNOSIS — F32A Depression, unspecified: Secondary | ICD-10-CM | POA: Diagnosis present

## 2022-01-04 DIAGNOSIS — Z7984 Long term (current) use of oral hypoglycemic drugs: Secondary | ICD-10-CM

## 2022-01-04 DIAGNOSIS — Z841 Family history of disorders of kidney and ureter: Secondary | ICD-10-CM

## 2022-01-04 DIAGNOSIS — N400 Enlarged prostate without lower urinary tract symptoms: Secondary | ICD-10-CM | POA: Diagnosis present

## 2022-01-04 DIAGNOSIS — G40909 Epilepsy, unspecified, not intractable, without status epilepticus: Secondary | ICD-10-CM | POA: Diagnosis present

## 2022-01-04 DIAGNOSIS — Z8672 Personal history of thrombophlebitis: Secondary | ICD-10-CM

## 2022-01-04 DIAGNOSIS — I878 Other specified disorders of veins: Secondary | ICD-10-CM | POA: Diagnosis present

## 2022-01-04 DIAGNOSIS — Z86718 Personal history of other venous thrombosis and embolism: Secondary | ICD-10-CM

## 2022-01-04 DIAGNOSIS — N3000 Acute cystitis without hematuria: Secondary | ICD-10-CM | POA: Diagnosis present

## 2022-01-04 DIAGNOSIS — Z8701 Personal history of pneumonia (recurrent): Secondary | ICD-10-CM

## 2022-01-04 DIAGNOSIS — Z87891 Personal history of nicotine dependence: Secondary | ICD-10-CM

## 2022-01-04 DIAGNOSIS — E785 Hyperlipidemia, unspecified: Secondary | ICD-10-CM | POA: Diagnosis present

## 2022-01-04 DIAGNOSIS — I110 Hypertensive heart disease with heart failure: Secondary | ICD-10-CM | POA: Diagnosis present

## 2022-01-04 DIAGNOSIS — R000 Tachycardia, unspecified: Secondary | ICD-10-CM | POA: Diagnosis present

## 2022-01-04 DIAGNOSIS — Z8249 Family history of ischemic heart disease and other diseases of the circulatory system: Secondary | ICD-10-CM

## 2022-01-04 DIAGNOSIS — Z7401 Bed confinement status: Secondary | ICD-10-CM

## 2022-01-04 LAB — URINALYSIS, ROUTINE W REFLEX MICROSCOPIC
Bilirubin Urine: NEGATIVE
Glucose, UA: NEGATIVE mg/dL
Ketones, ur: NEGATIVE mg/dL
Nitrite: POSITIVE — AB
Protein, ur: NEGATIVE mg/dL
Specific Gravity, Urine: 1.014 (ref 1.005–1.030)
WBC, UA: >50 WBC/hpf — ABNORMAL HIGH (ref 0–5)
pH: 7 (ref 5.0–8.0)

## 2022-01-04 LAB — CBC
HCT: 41.6 % (ref 39.0–52.0)
Hemoglobin: 12.6 g/dL — ABNORMAL LOW (ref 13.0–17.0)
MCH: 27.5 pg (ref 26.0–34.0)
MCHC: 30.3 g/dL (ref 30.0–36.0)
MCV: 90.8 fL (ref 80.0–100.0)
Platelets: 270 10*3/uL (ref 150–400)
RBC: 4.58 MIL/uL (ref 4.22–5.81)
RDW: 15 % (ref 11.5–15.5)
WBC: 7.8 10*3/uL (ref 4.0–10.5)
nRBC: 0 % (ref 0.0–0.2)

## 2022-01-04 MED ORDER — OXYCODONE-ACETAMINOPHEN 5-325 MG PO TABS
2.0000 | ORAL_TABLET | Freq: Once | ORAL | Status: AC
  Administered 2022-01-05: 2 via ORAL
  Filled 2022-01-04: qty 2

## 2022-01-04 NOTE — ED Triage Notes (Signed)
BIBA from home for UTI he tests at home, symptoms dark urine, painful urination all started today,c/o B leg pain and Ostomy pain  156/90 94 20 92-94% RA

## 2022-01-05 ENCOUNTER — Encounter (HOSPITAL_COMMUNITY): Payer: Self-pay | Admitting: Emergency Medicine

## 2022-01-05 ENCOUNTER — Emergency Department (HOSPITAL_COMMUNITY): Payer: Medicare Other

## 2022-01-05 DIAGNOSIS — N3000 Acute cystitis without hematuria: Secondary | ICD-10-CM | POA: Diagnosis not present

## 2022-01-05 DIAGNOSIS — N390 Urinary tract infection, site not specified: Secondary | ICD-10-CM | POA: Diagnosis present

## 2022-01-05 LAB — COMPREHENSIVE METABOLIC PANEL
ALT: 12 U/L (ref 0–44)
AST: 14 U/L — ABNORMAL LOW (ref 15–41)
Albumin: 3.3 g/dL — ABNORMAL LOW (ref 3.5–5.0)
Alkaline Phosphatase: 33 U/L — ABNORMAL LOW (ref 38–126)
Anion gap: 7 (ref 5–15)
BUN: 11 mg/dL (ref 8–23)
CO2: 27 mmol/L (ref 22–32)
Calcium: 8.4 mg/dL — ABNORMAL LOW (ref 8.9–10.3)
Chloride: 105 mmol/L (ref 98–111)
Creatinine, Ser: 0.54 mg/dL — ABNORMAL LOW (ref 0.61–1.24)
GFR, Estimated: >60 mL/min (ref >60)
Glucose, Bld: 148 mg/dL — ABNORMAL HIGH (ref 70–99)
Potassium: 3.9 mmol/L (ref 3.5–5.1)
Sodium: 139 mmol/L (ref 135–145)
Total Bilirubin: 0.2 mg/dL — ABNORMAL LOW (ref 0.3–1.2)
Total Protein: 6.7 g/dL (ref 6.5–8.1)

## 2022-01-05 LAB — I-STAT CHEM 8, ED
BUN: 11 mg/dL (ref 8–23)
Calcium, Ion: 1.15 mmol/L (ref 1.15–1.40)
Chloride: 99 mmol/L (ref 98–111)
Creatinine, Ser: 0.5 mg/dL — ABNORMAL LOW (ref 0.61–1.24)
Glucose, Bld: 145 mg/dL — ABNORMAL HIGH (ref 70–99)
HCT: 39 % (ref 39.0–52.0)
Hemoglobin: 13.3 g/dL (ref 13.0–17.0)
Potassium: 4 mmol/L (ref 3.5–5.1)
Sodium: 138 mmol/L (ref 135–145)
TCO2: 30 mmol/L (ref 22–32)

## 2022-01-05 LAB — GLUCOSE, CAPILLARY
Glucose-Capillary: 139 mg/dL — ABNORMAL HIGH (ref 70–99)
Glucose-Capillary: 126 mg/dL — ABNORMAL HIGH (ref 70–99)
Glucose-Capillary: 144 mg/dL — ABNORMAL HIGH (ref 70–99)
Glucose-Capillary: 161 mg/dL — ABNORMAL HIGH (ref 70–99)

## 2022-01-05 MED ORDER — FUROSEMIDE 10 MG/ML IJ SOLN
40.0000 mg | Freq: Once | INTRAMUSCULAR | Status: AC
  Administered 2022-01-05: 40 mg via INTRAVENOUS
  Filled 2022-01-05: qty 4

## 2022-01-05 MED ORDER — GABAPENTIN 300 MG PO CAPS
300.0000 mg | ORAL_CAPSULE | Freq: Every day | ORAL | Status: DC
  Administered 2022-01-05 – 2022-01-09 (×5): 300 mg via ORAL
  Filled 2022-01-05 (×5): qty 1

## 2022-01-05 MED ORDER — OXYCODONE HCL 5 MG PO TABS
5.0000 mg | ORAL_TABLET | Freq: Four times a day (QID) | ORAL | Status: DC | PRN
  Administered 2022-01-05 – 2022-01-06 (×5): 5 mg via ORAL
  Filled 2022-01-05 (×6): qty 1

## 2022-01-05 MED ORDER — INSULIN ASPART 100 UNIT/ML IJ SOLN
0.0000 [IU] | Freq: Three times a day (TID) | INTRAMUSCULAR | Status: DC
  Administered 2022-01-05 (×3): 1 [IU] via SUBCUTANEOUS
  Administered 2022-01-06: 2 [IU] via SUBCUTANEOUS
  Administered 2022-01-06: 1 [IU] via SUBCUTANEOUS
  Administered 2022-01-06 – 2022-01-07 (×3): 2 [IU] via SUBCUTANEOUS
  Administered 2022-01-08: 1 [IU] via SUBCUTANEOUS
  Administered 2022-01-08: 2 [IU] via SUBCUTANEOUS
  Administered 2022-01-08 – 2022-01-10 (×3): 1 [IU] via SUBCUTANEOUS

## 2022-01-05 MED ORDER — CEFTRIAXONE SODIUM 2 G IJ SOLR
2.0000 g | INTRAMUSCULAR | Status: DC
  Administered 2022-01-05 – 2022-01-09 (×5): 2 g via INTRAVENOUS
  Filled 2022-01-05 (×5): qty 20

## 2022-01-05 MED ORDER — VITAMIN D 25 MCG (1000 UNIT) PO TABS
1000.0000 [IU] | ORAL_TABLET | Freq: Every day | ORAL | Status: DC
  Administered 2022-01-05 – 2022-01-10 (×6): 1000 [IU] via ORAL
  Filled 2022-01-05 (×6): qty 1

## 2022-01-05 MED ORDER — SODIUM CHLORIDE 0.9 % IV SOLN
1.0000 g | Freq: Once | INTRAVENOUS | Status: AC
  Administered 2022-01-05: 1 g via INTRAVENOUS
  Filled 2022-01-05: qty 10

## 2022-01-05 MED ORDER — LAMOTRIGINE 25 MG PO TABS
150.0000 mg | ORAL_TABLET | Freq: Two times a day (BID) | ORAL | Status: DC
  Administered 2022-01-05 – 2022-01-10 (×12): 150 mg via ORAL
  Filled 2022-01-05 (×14): qty 2

## 2022-01-05 MED ORDER — OYSTER SHELL CALCIUM/D3 500-5 MG-MCG PO TABS
1.0000 | ORAL_TABLET | Freq: Every day | ORAL | Status: DC
  Administered 2022-01-05 – 2022-01-10 (×6): 1 via ORAL
  Filled 2022-01-05 (×6): qty 1

## 2022-01-05 MED ORDER — LOSARTAN POTASSIUM 50 MG PO TABS
25.0000 mg | ORAL_TABLET | Freq: Two times a day (BID) | ORAL | Status: DC
  Administered 2022-01-05 – 2022-01-10 (×11): 25 mg via ORAL
  Filled 2022-01-05 (×11): qty 1

## 2022-01-05 MED ORDER — DULOXETINE HCL 30 MG PO CPEP
60.0000 mg | ORAL_CAPSULE | Freq: Every day | ORAL | Status: DC
  Administered 2022-01-05 – 2022-01-10 (×6): 60 mg via ORAL
  Filled 2022-01-05 (×6): qty 2

## 2022-01-05 MED ORDER — INSULIN ASPART 100 UNIT/ML IJ SOLN: 0.0000 [IU] | Freq: Every day | INTRAMUSCULAR | Status: DC

## 2022-01-05 MED ORDER — FENOFIBRATE 160 MG PO TABS
160.0000 mg | ORAL_TABLET | Freq: Every day | ORAL | Status: DC
  Administered 2022-01-05 – 2022-01-10 (×6): 160 mg via ORAL
  Filled 2022-01-05 (×6): qty 1

## 2022-01-05 MED ORDER — DOXEPIN HCL 50 MG PO CAPS
150.0000 mg | ORAL_CAPSULE | Freq: Every day | ORAL | Status: DC
  Administered 2022-01-05 – 2022-01-09 (×5): 150 mg via ORAL
  Filled 2022-01-05 (×5): qty 3

## 2022-01-05 MED ORDER — ADULT MULTIVITAMIN W/MINERALS CH
1.0000 | ORAL_TABLET | Freq: Every day | ORAL | Status: DC
  Administered 2022-01-05 – 2022-01-10 (×6): 1 via ORAL
  Filled 2022-01-05 (×6): qty 1

## 2022-01-05 MED ORDER — PROCHLORPERAZINE EDISYLATE 10 MG/2ML IJ SOLN
10.0000 mg | Freq: Four times a day (QID) | INTRAMUSCULAR | Status: DC | PRN
  Administered 2022-01-06: 10 mg via INTRAVENOUS
  Filled 2022-01-05: qty 2

## 2022-01-05 MED ORDER — ACETAMINOPHEN 325 MG PO TABS
650.0000 mg | ORAL_TABLET | Freq: Four times a day (QID) | ORAL | Status: DC | PRN
  Administered 2022-01-05 – 2022-01-07 (×6): 650 mg via ORAL
  Filled 2022-01-05 (×6): qty 2

## 2022-01-05 MED ORDER — OMEGA-3-ACID ETHYL ESTERS 1 G PO CAPS
1.0000 g | ORAL_CAPSULE | Freq: Two times a day (BID) | ORAL | Status: DC
  Administered 2022-01-05 – 2022-01-10 (×11): 1 g via ORAL
  Filled 2022-01-05 (×11): qty 1

## 2022-01-05 MED ORDER — DILTIAZEM HCL ER COATED BEADS 240 MG PO CP24
240.0000 mg | ORAL_CAPSULE | Freq: Every day | ORAL | Status: DC
  Administered 2022-01-05 – 2022-01-10 (×6): 240 mg via ORAL
  Filled 2022-01-05 (×6): qty 1

## 2022-01-05 MED ORDER — HYDROMORPHONE HCL 1 MG/ML IJ SOLN
0.2500 mg | Freq: Once | INTRAMUSCULAR | Status: AC
  Administered 2022-01-05: 0.25 mg via INTRAVENOUS
  Filled 2022-01-05: qty 0.5

## 2022-01-05 MED ORDER — FERROUS SULFATE 325 (65 FE) MG PO TABS
325.0000 mg | ORAL_TABLET | Freq: Every day | ORAL | Status: DC
  Administered 2022-01-05 – 2022-01-10 (×6): 325 mg via ORAL
  Filled 2022-01-05 (×6): qty 1

## 2022-01-05 MED ORDER — ATORVASTATIN CALCIUM 10 MG PO TABS
10.0000 mg | ORAL_TABLET | Freq: Every day | ORAL | Status: DC
  Administered 2022-01-05 – 2022-01-09 (×5): 10 mg via ORAL
  Filled 2022-01-05 (×5): qty 1

## 2022-01-05 MED ORDER — LEVETIRACETAM 500 MG PO TABS
1500.0000 mg | ORAL_TABLET | Freq: Two times a day (BID) | ORAL | Status: DC
  Administered 2022-01-05 – 2022-01-10 (×12): 1500 mg via ORAL
  Filled 2022-01-05 (×12): qty 3

## 2022-01-05 MED ORDER — GABAPENTIN 300 MG PO CAPS
600.0000 mg | ORAL_CAPSULE | Freq: Every day | ORAL | Status: DC
  Administered 2022-01-05 – 2022-01-08 (×4): 600 mg via ORAL
  Filled 2022-01-05 (×4): qty 2

## 2022-01-05 MED ORDER — POLYETHYLENE GLYCOL 3350 17 G PO PACK: 17.0000 g | PACK | Freq: Every day | ORAL | Status: DC | PRN

## 2022-01-05 MED ORDER — DABIGATRAN ETEXILATE MESYLATE 150 MG PO CAPS
150.0000 mg | ORAL_CAPSULE | Freq: Two times a day (BID) | ORAL | Status: DC
  Administered 2022-01-05 – 2022-01-10 (×12): 150 mg via ORAL
  Filled 2022-01-05 (×12): qty 1

## 2022-01-05 MED ORDER — ALPHA LIPOIC ACID 200 MG PO CAPS: ORAL_CAPSULE | Freq: Every day | ORAL | Status: DC

## 2022-01-05 NOTE — Progress Notes (Signed)
Patient admitted midnight, please see H&P.  Here with UTI. Await culture  Eulogio Bear DO

## 2022-01-05 NOTE — Progress Notes (Signed)
PHARMACIST - PHYSICIAN ORDER COMMUNICATION  CONCERNING: P&T Medication Policy on Herbal Medications  DESCRIPTION:  This patient's order for:  alpha lipoic acid  has been noted.  This product(s) is classified as an "herbal" or natural product. Due to a lack of definitive safety studies or FDA approval, nonstandard manufacturing practices, plus the potential risk of unknown drug-drug interactions while on inpatient medications, the Pharmacy and Therapeutics Committee does not permit the use of "herbal" or natural products of this type within [facility].   ACTION TAKEN: The pharmacy department is unable to verify this order at this time and your patient has been informed of this safety policy. Please reevaluate patient's clinical condition at discharge and address if the herbal or natural product(s) should be resumed at that time.   Dolly Rias RPh 01/05/2022, 1:35 AM

## 2022-01-05 NOTE — ED Provider Notes (Signed)
[facility] DEPT Provider Note   CSN: [id number] Arrival date & time: 01/04/22  1941     History  Chief Complaint  Patient presents with   Abdominal Pain    [name] is a 71 y.o. male.  The history is provided by the patient and the spouse.  Abdominal Pain Pain location:  Suprapubic Pain radiates to:  Does not radiate Pain severity:  Severe Onset quality:  Gradual Timing:  Constant Progression:  Unchanged Chronicity:  New Context comment:  Has neuropathy and an ostomy Relieved by:  Nothing Worsened by:  Nothing Ineffective treatments: home percocet. Associated symptoms: dysuria   Associated symptoms: no fever and no vomiting   Risk factors: not pregnant        Home Medications Prior to Admission medications   Medication Sig Start Date End Date Taking? Authorizing Provider  acetaminophen (TYLENOL) 500 MG tablet Take 500-1,000 mg by mouth every 6 (six) hours as needed for mild pain, moderate pain, fever or headache.   Yes [provider]  ALPHA LIPOIC ACID PO Take 1 capsule by mouth daily.   Yes [provider]  atorvastatin (LIPITOR) 10 MG tablet Take 10 mg by mouth at bedtime. 12/13/20  Yes [provider]  Calcium Carbonate-Vitamin D 600-400 MG-UNIT tablet Take 1 tablet by mouth daily.   Yes [provider]  Cholecalciferol (VITAMIN D3 PO) Take 1 capsule by mouth daily.   Yes [provider]  dabigatran (PRADAXA) 150 MG CAPS capsule TAKE 1 CAPSULE BY MOUTH TWICE A DAY 12/20/21  Yes Hilty, Nadean Corwin, MD  DILT-XR 240 MG 24 hr capsule Take 240 mg by mouth daily. 10/28/21  Yes [provider]  doxepin (SINEQUAN) 75 MG capsule Take 150 mg by mouth at bedtime.    Yes [provider]  DULoxetine (CYMBALTA) 60 MG capsule Take 60 mg by mouth daily.   Yes [provider]  fenofibrate 160 MG tablet Take 160 mg by mouth daily.   Yes [provider]  Ferrous Sulfate  (IRON PO) Take 1 tablet by mouth daily.   Yes [provider]  gabapentin (NEURONTIN) 600 MG tablet TAKE 1 TABLET BY MOUTH AT LUNCH AND TAKE 2 TABLETS BY MOUTH AT [facility] 08/26/21  Yes Dohmeier, Asencion Partridge, MD  glipiZIDE (GLUCOTROL XL) 2.5 MG 24 hr tablet Take 2.5 mg by mouth in the morning and at bedtime.  05/16/17  Yes [provider]  Lactobacillus (PROBIOTIC ACIDOPHILUS PO) Take 1 tablet by mouth daily.   Yes [provider]  lamoTRIgine (LAMICTAL) 150 MG tablet Take 1 tablet (150 mg total) by mouth 2 (two) times daily. 03/06/21  Yes Dohmeier, Asencion Partridge, MD  levETIRAcetam (KEPPRA) 750 MG tablet Take 2 tablets (1,500 mg total) by mouth 2 (two) times daily. Please call and make overdue appt for further refills. 1st attempt 09/16/21  Yes Dohmeier, Asencion Partridge, MD  losartan (COZAAR) 25 MG tablet TAKE 1 TABLET BY MOUTH TWICE A DAY Patient taking differently: Take 25 mg by mouth 2 (two) times daily. 01/22/21  Yes Hilty, Nadean Corwin, MD  metFORMIN (GLUCOPHAGE) 1000 MG tablet Take 1,000 mg by mouth 2 (two) times daily. 12/18/20  Yes [provider]  Multiple Vitamin (MULTIVITAMIN WITH MINERALS) TABS Take 1 tablet by mouth daily.   Yes [provider]  naloxone (NARCAN) nasal spray 4 mg/0.1 mL Place 1 spray into the nose once. 08/29/20  Yes [provider]  Omega-3 Fatty Acids (FISH OIL) 1000 MG CAPS Take  1,000 mg by mouth 2 (two) times a day.   Yes [provider]  oxyCODONE-acetaminophen (PERCOCET) 10-325 MG tablet Take 1 tablet by mouth 5 (five) times daily as needed. 09/28/20  Yes [provider]      Allergies    Penicillins    Review of Systems   Review of Systems  Constitutional:  Negative for fever.  HENT:  Negative for facial swelling.   Eyes:  Negative for redness.  Respiratory:  Negative for wheezing and stridor.   Cardiovascular:  Negative for leg swelling.  Gastrointestinal:  Positive for abdominal pain. Negative for vomiting.   Genitourinary:  Positive for dysuria.  Musculoskeletal:  Positive for arthralgias.    Physical Exam Updated Vital Signs BP (!) 155/91   Pulse 71   Temp 97.7 F (36.5 C) (Oral)   Resp 19   Ht '6\' 5"'$  (1.956 m)   Wt (!) 136.1 kg   SpO2 (!) 87%   BMI 35.58 kg/m  Physical Exam Vitals and nursing note reviewed.  Constitutional:      General: He is not in acute distress.    Appearance: He is well-developed. He is not diaphoretic.  HENT:     Head: Normocephalic and atraumatic.     Nose: Nose normal.  Eyes:     Conjunctiva/sclera: Conjunctivae normal.     Pupils: Pupils are equal, round, and reactive to light.  Cardiovascular:     Rate and Rhythm: Normal rate and regular rhythm.     Pulses: Normal pulses.     Heart sounds: Normal heart sounds.  Pulmonary:     Effort: Pulmonary effort is normal.     Breath sounds: Rales present. No wheezing.  Abdominal:     General: Bowel sounds are normal.     Palpations: Abdomen is soft.     Tenderness: There is no abdominal tenderness. There is no guarding or rebound.  Musculoskeletal:        General: Normal range of motion.     Cervical back: Normal range of motion and neck supple.  Skin:    General: Skin is warm and dry.     Capillary Refill: Capillary refill takes less than 2 seconds.  Neurological:     General: No focal deficit present.     Mental Status: He is alert and oriented to person, place, and time.     Deep Tendon Reflexes: Reflexes normal.  Psychiatric:        Mood and Affect: Mood normal.        Behavior: Behavior normal.     ED Results / Procedures / Treatments   Labs (all labs ordered are listed, but only abnormal results are displayed) Results for orders placed or performed during the hospital encounter of 01/04/22  Urinalysis, Routine w reflex microscopic Urine, Clean Catch  Result Value Ref Range   Color, Urine YELLOW YELLOW   APPearance HAZY (A) CLEAR   Specific Gravity, Urine 1.014 1.005 - 1.030   pH 7.0  5.0 - 8.0   Glucose, UA NEGATIVE NEGATIVE mg/dL   Hgb urine dipstick MODERATE (A) NEGATIVE   Bilirubin Urine NEGATIVE NEGATIVE   Ketones, ur NEGATIVE NEGATIVE mg/dL   Protein, ur NEGATIVE NEGATIVE mg/dL   Nitrite POSITIVE (A) NEGATIVE   Leukocytes,Ua LARGE (A) NEGATIVE   RBC / HPF 11-20 0 - 5 RBC/hpf   WBC, UA >50 (H) 0 - 5 WBC/hpf   Bacteria, UA MANY (A) NONE SEEN   Squamous Epithelial / LPF 0-5 0 -  5  CBC  Result Value Ref Range   WBC 7.8 4.0 - 10.5 K/uL   RBC 4.58 4.22 - 5.81 MIL/uL   Hemoglobin 12.6 (L) 13.0 - 17.0 g/dL   HCT 41.6 39.0 - 52.0 %   MCV 90.8 80.0 - 100.0 fL   MCH 27.5 26.0 - 34.0 pg   MCHC 30.3 30.0 - 36.0 g/dL   RDW 15.0 11.5 - 15.5 %   Platelets 270 150 - 400 K/uL   nRBC 0.0 0.0 - 0.2 %   CT Renal Stone Study  Result Date: 01/05/2022 CLINICAL DATA:  Flank pain, kidney stone suspected EXAM: CT ABDOMEN AND PELVIS WITHOUT CONTRAST TECHNIQUE: Multidetector CT imaging of the abdomen and pelvis was performed following the standard protocol without IV contrast. RADIATION DOSE REDUCTION: This exam was performed according to the departmental dose-optimization program which includes automated exposure control, adjustment of the mA and/or kV according to patient size and/or use of iterative reconstruction technique. COMPARISON:  05/31/2019 FINDINGS: Lower chest: Bibasilar scarring.  Mild cardiomegaly.  No effusions. Hepatobiliary: Small layering gallstone within the gallbladder, similar to prior study. No focal hepatic abnormality or biliary ductal dilatation. Pancreas: No focal abnormality or ductal dilatation. Spleen: No focal abnormality.  Normal size. Adrenals/Urinary Tract: 8 mm nonobstructing stone in the lower pole of the right kidney. No ureteral stones or hydronephrosis. No suspicious renal or adrenal mass. Urinary bladder unremarkable. Stomach/Bowel: Prior partial colectomy. Right lower quadrant and left lower quadrant ostomy sites again noted, unchanged. No evidence of  bowel obstruction. Stomach unremarkable. Vascular/Lymphatic: Scattered aortic calcifications. No evidence of aneurysm or adenopathy. Reproductive: No visible focal abnormality. Other: No free fluid or free air. Musculoskeletal: No acute bony abnormality. IMPRESSION: Bibasilar scarring. Stable cholelithiasis.  No CT evidence of acute cholecystitis. Right nephrolithiasis.  No hydronephrosis. No acute findings. Electronically Signed   By: Rolm Baptise M.D.   On: 01/05/2022 00:34   DG Chest Portable 1 View  Result Date: 01/05/2022 CLINICAL DATA:  Chest pain EXAM: PORTABLE CHEST 1 VIEW COMPARISON:  05/21/2014 FINDINGS: Cardiac shadow is mildly enlarged. Central vascular congestion is noted. No significant edema is seen. No bony abnormality is noted. IMPRESSION: Changes of mild CHF.  No edema is noted. Electronically Signed   By: Inez Catalina M.D.   On: 01/05/2022 00:10     Radiology CT Renal Stone Study  Result Date: 01/05/2022 CLINICAL DATA:  Flank pain, kidney stone suspected EXAM: CT ABDOMEN AND PELVIS WITHOUT CONTRAST TECHNIQUE: Multidetector CT imaging of the abdomen and pelvis was performed following the standard protocol without IV contrast. RADIATION DOSE REDUCTION: This exam was performed according to the departmental dose-optimization program which includes automated exposure control, adjustment of the mA and/or kV according to patient size and/or use of iterative reconstruction technique. COMPARISON:  05/31/2019 FINDINGS: Lower chest: Bibasilar scarring.  Mild cardiomegaly.  No effusions. Hepatobiliary: Small layering gallstone within the gallbladder, similar to prior study. No focal hepatic abnormality or biliary ductal dilatation. Pancreas: No focal abnormality or ductal dilatation. Spleen: No focal abnormality.  Normal size. Adrenals/Urinary Tract: 8 mm nonobstructing stone in the lower pole of the right kidney. No ureteral stones or hydronephrosis. No suspicious renal or adrenal mass. Urinary  bladder unremarkable. Stomach/Bowel: Prior partial colectomy. Right lower quadrant and left lower quadrant ostomy sites again noted, unchanged. No evidence of bowel obstruction. Stomach unremarkable. Vascular/Lymphatic: Scattered aortic calcifications. No evidence of aneurysm or adenopathy. Reproductive: No visible focal abnormality. Other: No free fluid or free air. Musculoskeletal: No acute bony  abnormality. IMPRESSION: Bibasilar scarring. Stable cholelithiasis.  No CT evidence of acute cholecystitis. Right nephrolithiasis.  No hydronephrosis. No acute findings. Electronically Signed   By: Rolm Baptise M.D.   On: 01/05/2022 00:34   DG Chest Portable 1 View  Result Date: 01/05/2022 CLINICAL DATA:  Chest pain EXAM: PORTABLE CHEST 1 VIEW COMPARISON:  05/21/2014 FINDINGS: Cardiac shadow is mildly enlarged. Central vascular congestion is noted. No significant edema is seen. No bony abnormality is noted. IMPRESSION: Changes of mild CHF.  No edema is noted. Electronically Signed   By: Inez Catalina M.D.   On: 01/05/2022 00:10    Procedures Procedures    Medications Ordered in ED Medications  cefTRIAXone (ROCEPHIN) 1 g in sodium chloride 0.9 % 100 mL IVPB (has no administration in time range)  furosemide (LASIX) injection 40 mg (has no administration in time range)  oxyCODONE-acetaminophen (PERCOCET/ROXICET) 5-325 MG per tablet 2 tablet (2 tablets Oral Given 01/05/22 0021)    ED Course/ Medical Decision Making/ A&P                           Medical Decision Making Patient with urinary symptoms and leg pain   Amount and/or Complexity of Data Reviewed Independent Historian: spouse    Details: See above  External Data Reviewed: notes.    Details: Previous notes reviewed  Labs: ordered.    Details: All labs reviewed:  urine is consistent with UTI, normal white count 7.8 and low hemoglobin 12.6 normal platelet counts  Radiology: ordered.    Details: CT negative for acute finding by read,  CHF by me  on CXR  Risk Prescription drug management. Decision regarding hospitalization.    Final Clinical Impression(s) / ED Diagnoses Final diagnoses:  Acute cystitis without hematuria  Congestive heart failure, unspecified HF chronicity, unspecified heart failure type [facility])   The patient appears reasonably stabilized for admission considering the current resources, flow, and capabilities available in the ED at this time, and I doubt any other [facility] requiring further screening and/or treatment in the ED prior to admission.  Rx / DC Orders ED Discharge Orders     None         Juandaniel Manfredo, MD 01/05/22 8032

## 2022-01-05 NOTE — H&P (Signed)
History and Physical  [name] HBZ:[id number] DOB: Oct 08, 1950 DOA: 01/04/2022  Referring physician: Dr. Randal Buba, EDP  PCP: Merlene Laughter, MD  Outpatient Specialists: Cardiology, neurology Patient coming from: Home  Chief Complaint: Burning with urination.   HPI: [name] is a 71 y.o. male with medical history significant for paroxysmal A-fib on Pradaxa, history of DVT, peripheral neuropathy, seizure disorder, nonambulatory, hyperlipidemia, bilateral lower extremity venous stasis edema, recurrent UTIs who presented to [facility] ED with complaints of dysuria.  Did a home test at home with concern for UTI.  Also complains of bilateral lower extremity pain from his peripheral neuropathy.    In the ED, UA positive for pyuria.  Bilateral lower extremity edema on exam.  Started on Rocephin and IV Lasix.  TRH, hospitalist service, was asked to admit.  ED Course: Tmax 98.  BP 116/80, pulse 102, respiration rate 23, with saturation 89% on room air.  CBC essentially unremarkable.  No leukocytosis.  Review of Systems: Review of systems as noted in the HPI. All other systems reviewed and are negative.   Past Medical History:  Diagnosis Date   Arthritis    Atrial fibrillation ([place])    a. Dx 05/2014 in setting of seizure, PNA. S/p TEE/DCCV.   Bowel perforation ([place])    a. transvere and R colectomy and ileostomy in 12/25/13.   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   Psoriasis    Seizures (HCC)    Venous stasis    Weakness of both legs    Past Surgical History:  Procedure Laterality Date    ANKLE FRACTURE SURGERY  09/04/13   CARDIOVERSION N/A 05/24/2014   Procedure: CARDIOVERSION;  Surgeon: Josue Hector, MD;  Location: [facility];  Service: Cardiovascular;  Laterality: N/A;   COLONOSCOPY  03/26/2012   Procedure: COLONOSCOPY;  Surgeon: Beryle Beams, MD;  Location: [facility] ENDOSCOPY;  Service: Endoscopy;  Laterality: N/A;   COLOSTOMY  12-25-13   Ruptured colon   FLEXIBLE SIGMOIDOSCOPY N/A 12/09/2013   Procedure: FLEXIBLE SIGMOIDOSCOPY;  Surgeon: Beryle Beams, MD;  Location: [facility] ENDOSCOPY;  Service: Endoscopy;  Laterality: N/A;   LAPAROTOMY N/A 12/25/2013   Procedure: EXPLORATORY LAPAROTOMY/Transverse and right coloectomy/ end  ileostomy and mucus  fistula;  Surgeon: Ralene Ok, MD;  Location: [place];  Service: General;  Laterality: N/A;   TEE WITHOUT CARDIOVERSION N/A 05/24/2014   Procedure: TRANSESOPHAGEAL ECHOCARDIOGRAM (TEE);  Surgeon: Josue Hector, MD;  Location: [facility] ENDOSCOPY;  Service: Cardiovascular;  Laterality: N/A;   TONSILLECTOMY     as child    Social History:  reports that he quit smoking about 8 years ago. His smoking use included pipe. He has never used smokeless tobacco. He reports that he does not drink alcohol and does not use drugs.   Allergies  Allergen Reactions   Penicillins Other (See Comments)    Pt has no personal reaction to this med, but his mother/brother do. Has patient had a PCN reaction causing immediate rash, facial/tongue/throat swelling, SOB or lightheadedness  with hypotension: No Has patient had a PCN reaction causing severe rash involving mucus membranes or skin necrosis: No Has patient had a PCN reaction that required hospitalization No Has patient had a PCN reaction occurring within the last 10 years: No If all of the above answers are "NO", then may proceed with Cephalosporin    Family History  Problem Relation Age of Onset   Vision loss Father    Hypertension Father    Diabetes Father    Heart disease Father    Cancer Father         colon   Diabetes Mother    Hypertension Mother    Heart disease Mother    Dementia Mother    Cancer Mother        breast ca   Diabetes Sister    Hyperlipidemia Sister    Hypertension Sister    Diabetes Brother    Kidney disease Brother    Heart disease Brother       Prior to Admission medications   Medication Sig Start Date End Date Taking? Authorizing Provider  acetaminophen (TYLENOL) 500 MG tablet Take 500-1,000 mg by mouth every 6 (six) hours as needed for mild pain, moderate pain, fever or headache.   Yes [provider]  ALPHA LIPOIC ACID PO Take 1 capsule by mouth daily.   Yes [provider]  atorvastatin (LIPITOR) 10 MG tablet Take 10 mg by mouth at bedtime. 12/13/20  Yes [provider]  Calcium Carbonate-Vitamin D 600-400 MG-UNIT tablet Take 1 tablet by mouth daily.   Yes [provider]  Cholecalciferol (VITAMIN D3 PO) Take 1 capsule by mouth daily.   Yes [provider]  dabigatran (PRADAXA) 150 MG CAPS capsule TAKE 1 CAPSULE BY MOUTH TWICE A DAY 12/20/21  Yes Hilty, Nadean Corwin, MD  DILT-XR 240 MG 24 hr capsule Take 240 mg by mouth daily. 10/28/21  Yes [provider]  doxepin (SINEQUAN) 75 MG capsule Take 150 mg by mouth at bedtime.    Yes [provider]  DULoxetine (CYMBALTA) 60 MG capsule Take 60 mg by mouth daily.   Yes [provider]  fenofibrate 160 MG tablet Take 160 mg by mouth daily.   Yes [provider]  Ferrous Sulfate (IRON PO) Take 1 tablet by mouth daily.   Yes [provider]  gabapentin (NEURONTIN) 600 MG tablet TAKE 1 TABLET BY MOUTH AT LUNCH AND TAKE 2 TABLETS BY MOUTH AT [facility] 08/26/21  Yes Dohmeier, Asencion Partridge, MD  glipiZIDE (GLUCOTROL XL) 2.5 MG 24 hr tablet Take 2.5 mg by mouth in the morning and at bedtime.  05/16/17  Yes [provider]  Lactobacillus (PROBIOTIC ACIDOPHILUS PO) Take 1 tablet by mouth daily.   Yes [provider]  lamoTRIgine  (LAMICTAL) 150 MG tablet Take 1 tablet (150 mg total) by mouth 2 (two) times daily. 03/06/21  Yes Dohmeier, Asencion Partridge, MD  levETIRAcetam (KEPPRA) 750 MG tablet Take 2 tablets (1,500 mg total) by mouth 2 (two) times daily. Please call and make overdue appt for further refills. 1st attempt 09/16/21  Yes Dohmeier, Asencion Partridge, MD  losartan (COZAAR) 25 MG tablet TAKE 1 TABLET BY MOUTH TWICE A DAY Patient taking differently: Take 25 mg by mouth 2 (two) times daily. 01/22/21  Yes Hilty, Nadean Corwin, MD  metFORMIN (GLUCOPHAGE) 1000 MG tablet Take 1,000 mg by mouth 2 (two) times daily. 12/18/20  Yes [provider]  Multiple Vitamin (MULTIVITAMIN WITH MINERALS) TABS Take 1 tablet by mouth  daily.   Yes [provider]  naloxone (NARCAN) nasal spray 4 mg/0.1 mL Place 1 spray into the nose once. 08/29/20  Yes [provider]  Omega-3 Fatty Acids (FISH OIL) 1000 MG CAPS Take 1,000 mg by mouth 2 (two) times a day.   Yes [provider]  oxyCODONE-acetaminophen (PERCOCET) 10-325 MG tablet Take 1 tablet by mouth 5 (five) times daily as needed. 09/28/20  Yes [provider]    Physical Exam: BP (!) 155/91   Pulse 71   Temp 97.7 F (36.5 C) (Oral)   Resp 19   Ht '6\' 5"'$  (1.956 m)   Wt (!) 136.1 kg   SpO2 (!) 87%   BMI 35.58 kg/m   General: 71 y.o. year-old male well developed well nourished in no acute distress.  Alert and oriented x3. Cardiovascular: Regular rate and rhythm with no rubs or gallops.  No thyromegaly or JVD noted.  Bilateral lower extremity edema. Respiratory: Clear to auscultation with no wheezes or rales. Good inspiratory effort. Abdomen: Soft nontender nondistended with normal bowel sounds x4 quadrants. Muskuloskeletal: Bilateral lower extremity edema.  No cyanosis. Neuro: CN II-XII intact, strength, sensation, reflexes Skin: Venous stasis edema in lower extremities. Psychiatry: Judgement and insight appear normal. Mood is appropriate for condition and  setting          Labs on Admission:  Basic Metabolic Panel: No results for input(s): "NA", "K", "CL", "CO2", "GLUCOSE", "BUN", "CREATININE", "CALCIUM", "MG", "PHOS" in the last 168 hours. Liver Function Tests: No results for input(s): "AST", "ALT", "ALKPHOS", "BILITOT", "PROT", "ALBUMIN" in the last 168 hours. No results for input(s): "LIPASE", "AMYLASE" in the last 168 hours. No results for input(s): "AMMONIA" in the last 168 hours. CBC: Recent Labs  Lab 01/04/22 2149  WBC 7.8  HGB 12.6*  HCT 41.6  MCV 90.8  PLT 270   Cardiac Enzymes: No results for input(s): "CKTOTAL", "CKMB", "CKMBINDEX", "TROPONINI" in the last 168 hours.  BNP (last 3 results) No results for input(s): "BNP" in the last 8760 hours.  ProBNP (last 3 results) No results for input(s): "PROBNP" in the last 8760 hours.  CBG: No results for input(s): "GLUCAP" in the last 168 hours.  Radiological Exams on Admission: CT Renal Stone Study  Result Date: 01/05/2022 CLINICAL DATA:  Flank pain, kidney stone suspected EXAM: CT ABDOMEN AND PELVIS WITHOUT CONTRAST TECHNIQUE: Multidetector CT imaging of the abdomen and pelvis was performed following the standard protocol without IV contrast. RADIATION DOSE REDUCTION: This exam was performed according to the departmental dose-optimization program which includes automated exposure control, adjustment of the mA and/or kV according to patient size and/or use of iterative reconstruction technique. COMPARISON:  05/31/2019 FINDINGS: Lower chest: Bibasilar scarring.  Mild cardiomegaly.  No effusions. Hepatobiliary: Small layering gallstone within the gallbladder, similar to prior study. No focal hepatic abnormality or biliary ductal dilatation. Pancreas: No focal abnormality or ductal dilatation. Spleen: No focal abnormality.  Normal size. Adrenals/Urinary Tract: 8 mm nonobstructing stone in the lower pole of the right kidney. No ureteral stones or hydronephrosis. No suspicious renal or  adrenal mass. Urinary bladder unremarkable. Stomach/Bowel: Prior partial colectomy. Right lower quadrant and left lower quadrant ostomy sites again noted, unchanged. No evidence of bowel obstruction. Stomach unremarkable. Vascular/Lymphatic: Scattered aortic calcifications. No evidence of aneurysm or adenopathy. Reproductive: No visible focal abnormality. Other: No free fluid or free air. Musculoskeletal: No acute bony abnormality. IMPRESSION: Bibasilar scarring. Stable cholelithiasis.  No CT evidence of acute cholecystitis. Right nephrolithiasis.  No hydronephrosis.  No acute findings. Electronically Signed   By: Rolm Baptise M.D.   On: 01/05/2022 00:34   DG Chest Portable 1 View  Result Date: 01/05/2022 CLINICAL DATA:  Chest pain EXAM: PORTABLE CHEST 1 VIEW COMPARISON:  05/21/2014 FINDINGS: Cardiac shadow is mildly enlarged. Central vascular congestion is noted. No significant edema is seen. No bony abnormality is noted. IMPRESSION: Changes of mild CHF.  No edema is noted. Electronically Signed   By: Inez Catalina M.D.   On: 01/05/2022 00:10    EKG: I independently viewed the EKG done and my findings are as followed: None available at the time of this visit.  Assessment/Plan Present on Admission:  UTI (urinary tract infection)  Principal Problem:   UTI (urinary tract infection)  UTI, POA UA positive for pyuria, dysuria. Follow urine culture for ID and sensitivities. Continue Rocephin, started in the ED  Seizure disorder Resume home AEDs  Paroxysmal A-fib on Pradaxa Resume home meds   History of DVT on Pradaxa  Resume home regimen  Chronic anxiety/depression Resume home regimen  Hypertension Resume home regimen BP stable.  Prediabetes Hold off home oral antiglycemic's Obtain hemoglobin A1c Start insulin sliding scale   DVT prophylaxis: Pradaxa  Code Status: Full code  Family Communication: Updated his wife at bedside.  Disposition Plan: Admitted to telemetry  unit  Consults called: None.  Admission status: Observation status.   Status is: Observation    Kayleen Memos MD [facility] Pager [phone]  If 7PM-7AM, please contact night-coverage [email or web address] Password [email or web address]  01/05/2022, 1:02 AM

## 2022-01-06 DIAGNOSIS — Z932 Ileostomy status: Secondary | ICD-10-CM | POA: Diagnosis not present

## 2022-01-06 DIAGNOSIS — Z87891 Personal history of nicotine dependence: Secondary | ICD-10-CM | POA: Diagnosis not present

## 2022-01-06 DIAGNOSIS — Z833 Family history of diabetes mellitus: Secondary | ICD-10-CM | POA: Diagnosis not present

## 2022-01-06 DIAGNOSIS — Z841 Family history of disorders of kidney and ureter: Secondary | ICD-10-CM | POA: Diagnosis not present

## 2022-01-06 DIAGNOSIS — Z83438 Family history of other disorder of lipoprotein metabolism and other lipidemia: Secondary | ICD-10-CM | POA: Diagnosis not present

## 2022-01-06 DIAGNOSIS — E669 Obesity, unspecified: Secondary | ICD-10-CM | POA: Diagnosis present

## 2022-01-06 DIAGNOSIS — F32A Depression, unspecified: Secondary | ICD-10-CM | POA: Diagnosis present

## 2022-01-06 DIAGNOSIS — Z6837 Body mass index (BMI) 37.0-37.9, adult: Secondary | ICD-10-CM | POA: Diagnosis not present

## 2022-01-06 DIAGNOSIS — Z86718 Personal history of other venous thrombosis and embolism: Secondary | ICD-10-CM | POA: Diagnosis not present

## 2022-01-06 DIAGNOSIS — E1142 Type 2 diabetes mellitus with diabetic polyneuropathy: Secondary | ICD-10-CM | POA: Diagnosis present

## 2022-01-06 DIAGNOSIS — I509 Heart failure, unspecified: Secondary | ICD-10-CM | POA: Diagnosis present

## 2022-01-06 DIAGNOSIS — Z87440 Personal history of urinary (tract) infections: Secondary | ICD-10-CM | POA: Diagnosis not present

## 2022-01-06 DIAGNOSIS — N390 Urinary tract infection, site not specified: Secondary | ICD-10-CM | POA: Diagnosis present

## 2022-01-06 DIAGNOSIS — I110 Hypertensive heart disease with heart failure: Secondary | ICD-10-CM | POA: Diagnosis present

## 2022-01-06 DIAGNOSIS — B964 Proteus (mirabilis) (morganii) as the cause of diseases classified elsewhere: Secondary | ICD-10-CM | POA: Diagnosis present

## 2022-01-06 DIAGNOSIS — I878 Other specified disorders of veins: Secondary | ICD-10-CM | POA: Diagnosis present

## 2022-01-06 DIAGNOSIS — E785 Hyperlipidemia, unspecified: Secondary | ICD-10-CM | POA: Diagnosis present

## 2022-01-06 DIAGNOSIS — Z79899 Other long term (current) drug therapy: Secondary | ICD-10-CM | POA: Diagnosis not present

## 2022-01-06 DIAGNOSIS — Z8249 Family history of ischemic heart disease and other diseases of the circulatory system: Secondary | ICD-10-CM | POA: Diagnosis not present

## 2022-01-06 DIAGNOSIS — N10 Acute tubulo-interstitial nephritis: Secondary | ICD-10-CM | POA: Diagnosis present

## 2022-01-06 DIAGNOSIS — N3000 Acute cystitis without hematuria: Secondary | ICD-10-CM | POA: Diagnosis present

## 2022-01-06 DIAGNOSIS — F419 Anxiety disorder, unspecified: Secondary | ICD-10-CM | POA: Diagnosis present

## 2022-01-06 DIAGNOSIS — I480 Paroxysmal atrial fibrillation: Secondary | ICD-10-CM | POA: Diagnosis present

## 2022-01-06 DIAGNOSIS — G40909 Epilepsy, unspecified, not intractable, without status epilepticus: Secondary | ICD-10-CM | POA: Diagnosis present

## 2022-01-06 DIAGNOSIS — Z7984 Long term (current) use of oral hypoglycemic drugs: Secondary | ICD-10-CM | POA: Diagnosis not present

## 2022-01-06 LAB — GLUCOSE, CAPILLARY
Glucose-Capillary: 145 mg/dL — ABNORMAL HIGH (ref 70–99)
Glucose-Capillary: 170 mg/dL — ABNORMAL HIGH (ref 70–99)
Glucose-Capillary: 156 mg/dL — ABNORMAL HIGH (ref 70–99)
Glucose-Capillary: 131 mg/dL — ABNORMAL HIGH (ref 70–99)

## 2022-01-06 LAB — BASIC METABOLIC PANEL
Anion gap: 9 (ref 5–15)
BUN: 11 mg/dL (ref 8–23)
CO2: 26 mmol/L (ref 22–32)
Calcium: 8.8 mg/dL — ABNORMAL LOW (ref 8.9–10.3)
Chloride: 101 mmol/L (ref 98–111)
Creatinine, Ser: 0.61 mg/dL (ref 0.61–1.24)
GFR, Estimated: >60 mL/min (ref >60)
Glucose, Bld: 139 mg/dL — ABNORMAL HIGH (ref 70–99)
Potassium: 3.6 mmol/L (ref 3.5–5.1)
Sodium: 136 mmol/L (ref 135–145)

## 2022-01-06 LAB — HIV ANTIBODY (ROUTINE TESTING W REFLEX): HIV Screen 4th Generation wRfx: NONREACTIVE

## 2022-01-06 LAB — CBC
HCT: 43 % (ref 39.0–52.0)
Hemoglobin: 13.5 g/dL (ref 13.0–17.0)
MCH: 28.3 pg (ref 26.0–34.0)
MCHC: 31.4 g/dL (ref 30.0–36.0)
MCV: 90.1 fL (ref 80.0–100.0)
Platelets: 291 10*3/uL (ref 150–400)
RBC: 4.77 MIL/uL (ref 4.22–5.81)
RDW: 15.2 % (ref 11.5–15.5)
WBC: 10.3 10*3/uL (ref 4.0–10.5)
nRBC: 0 % (ref 0.0–0.2)

## 2022-01-06 LAB — HEMOGLOBIN A1C
Hgb A1c MFr Bld: 6.3 % — ABNORMAL HIGH (ref 4.8–5.6)
Mean Plasma Glucose: 134.11 mg/dL

## 2022-01-06 LAB — MAGNESIUM: Magnesium: 1.9 mg/dL (ref 1.7–2.4)

## 2022-01-06 LAB — PHOSPHORUS: Phosphorus: 2.9 mg/dL (ref 2.5–4.6)

## 2022-01-06 MED ORDER — ALUM & MAG HYDROXIDE-SIMETH 200-200-20 MG/5ML PO SUSP
15.0000 mL | ORAL | Status: DC | PRN
  Administered 2022-01-06: 15 mL via ORAL
  Filled 2022-01-06: qty 30

## 2022-01-06 MED ORDER — TRIAMCINOLONE 0.1 % CREAM:EUCERIN CREAM 1:1
TOPICAL_CREAM | Freq: Two times a day (BID) | CUTANEOUS | Status: DC | PRN
  Filled 2022-01-06 (×2): qty 1

## 2022-01-06 MED ORDER — OXYCODONE HCL 5 MG PO TABS: 5.0000 mg | ORAL_TABLET | ORAL | Status: DC | PRN

## 2022-01-06 MED ORDER — ZINC OXIDE 12.8 % EX OINT
TOPICAL_OINTMENT | Freq: Three times a day (TID) | CUTANEOUS | Status: DC
  Administered 2022-01-10: 1 via TOPICAL
  Filled 2022-01-06: qty 56.7

## 2022-01-06 MED ORDER — OXYCODONE HCL 5 MG PO TABS
10.0000 mg | ORAL_TABLET | ORAL | Status: DC | PRN
  Administered 2022-01-06 – 2022-01-10 (×18): 10 mg via ORAL
  Filled 2022-01-06 (×20): qty 2

## 2022-01-06 NOTE — Plan of Care (Signed)
No acute events overnight.    Problem: Education: Goal: Knowledge of General Education information will improve Description: Including pain rating scale, medication(s)/side effects and non-pharmacologic comfort measures Outcome: Progressing   Problem: Health Behavior/Discharge Planning: Goal: Ability to manage health-related needs will improve Outcome: Progressing   Problem: Clinical Measurements: Goal: Ability to maintain clinical measurements within normal limits will improve Outcome: Progressing Goal: Will remain free from infection Outcome: Progressing Goal: Diagnostic test results will improve Outcome: Progressing Goal: Respiratory complications will improve Outcome: Progressing Goal: Cardiovascular complication will be avoided Outcome: Progressing   Problem: Activity: Goal: Risk for activity intolerance will decrease Outcome: Progressing   Problem: Nutrition: Goal: Adequate nutrition will be maintained Outcome: Progressing   Problem: Coping: Goal: Level of anxiety will decrease Outcome: Progressing   Problem: Elimination: Goal: Will not experience complications related to bowel motility Outcome: Progressing Goal: Will not experience complications related to urinary retention Outcome: Progressing   Problem: Pain Managment: Goal: General experience of comfort will improve Outcome: Progressing   Problem: Safety: Goal: Ability to remain free from injury will improve Outcome: Progressing   Problem: Skin Integrity: Goal: Risk for impaired skin integrity will decrease Outcome: Progressing   Problem: Education: Goal: Ability to describe self-care measures that may prevent or decrease complications (Diabetes Survival Skills Education) will improve Outcome: Progressing Goal: Individualized Educational Video(s) Outcome: Progressing   Problem: Coping: Goal: Ability to adjust to condition or change in health will improve Outcome: Progressing   Problem: Fluid  Volume: Goal: Ability to maintain a balanced intake and output will improve Outcome: Progressing   Problem: Health Behavior/Discharge Planning: Goal: Ability to identify and utilize available resources and services will improve Outcome: Progressing Goal: Ability to manage health-related needs will improve Outcome: Progressing   Problem: Metabolic: Goal: Ability to maintain appropriate glucose levels will improve Outcome: Progressing   Problem: Nutritional: Goal: Maintenance of adequate nutrition will improve Outcome: Progressing Goal: Progress toward achieving an optimal weight will improve Outcome: Progressing   Problem: Skin Integrity: Goal: Risk for impaired skin integrity will decrease Outcome: Progressing   Problem: Tissue Perfusion: Goal: Adequacy of tissue perfusion will improve Outcome: Progressing   

## 2022-01-06 NOTE — Progress Notes (Signed)
PROGRESS NOTE    [name]  DGU:[id number] DOB: 1950-09-15 DOA: 01/04/2022 PCP: Merlene Laughter, MD    Brief Narrative:  [name] is a 71 y.o. male with medical history significant for paroxysmal A-fib on Pradaxa, history of DVT, peripheral neuropathy, seizure disorder, nonambulatory, hyperlipidemia, bilateral lower extremity venous stasis edema, recurrent UTIs who presented to [facility] ED with complaints of dysuria.  Did a home test at home with concern for UTI.  Also complains of bilateral lower extremity pain from his peripheral neuropathy.     In the ED, UA positive for pyuria.  Bilateral lower extremity edema on exam.  Started on Rocephin and IV Lasix.  TRH, hospitalist service, was asked to admit.  Assessment and Plan: UTI, POA/pyelonephritis UA positive for pyuria, dysuria also with flank pain. Follow urine culture for ID and sensitivities. Continue Rocephin   Seizure disorder Resume home AEDs   Paroxysmal A-fib on Pradaxa Resume home meds    History of DVT on Pradaxa  Resume home regimen   Chronic anxiety/depression Resume home regimen   Hypertension Resume home regimen BP stable.   Prediabetes - insulin sliding scale  Obesity Estimated body mass index is 37.72 kg/m as calculated from the following:   Height as of this encounter: '6\' 5"'$  (1.956 m).   Weight as of this encounter: 144.3 kg.    bed-ridden at home; does not ambulate or transfer    DVT prophylaxis:  dabigatran (PRADAXA) capsule 150 mg    Code Status: Full Code Family Communication:   Disposition Plan:  Level of care: Telemetry Status is: Observation The patient will require care spanning > 2 midnights and should be moved to inpatient because: needs IV abx    Consultants:  none   Subjective: C/o pain in buttocks and legs  Objective: Vitals:   01/05/22 1634 01/05/22 2112 01/06/22 0500 01/06/22 0507  BP: (!) 156/86 (!) 163/82  (!) 151/95  Pulse: (!) 52 72  (!) 108  Resp:  18 18    Temp: 98.1 F (36.7 C) 98.2 F (36.8 C)  (!) 97.4 F (36.3 C)  TempSrc: Oral Oral  Oral  SpO2: 96% 96%  94%  Weight:   (!) 144.3 kg   Height:        Intake/Output Summary (Last 24 hours) at 01/06/2022 1111 Last data filed at 01/06/2022 0500 Gross per 24 hour  Intake 320 ml  Output 2350 ml  Net -2030 ml   Filed Weights   01/04/22 1950 01/05/22 0353 01/06/22 0500  Weight: (!) 136.1 kg (!) 147.6 kg (!) 144.3 kg    Examination:   General: Appearance:    Obese male in no acute distress     Lungs:     respirations unlabored  Heart:    Tachycardic.    MS:   All extremities are intact.    Neurologic:   Awake, alert       Data Reviewed: I have personally reviewed following labs and imaging studies  CBC: Recent Labs  Lab 01/04/22 2149 01/05/22 0143 01/06/22 0502  WBC 7.8  --  10.3  HGB 12.6* 13.3 13.5  HCT 41.6 39.0 43.0  MCV 90.8  --  90.1  PLT 270  --  956   Basic Metabolic Panel: Recent Labs  Lab 01/05/22 0056 01/05/22 0143 01/06/22 0502  NA 139 138 136  K 3.9 4.0 3.6  CL 105 99 101  CO2 27  --  26  GLUCOSE 148* 145* 139*  BUN  $'11 11 11  'm$ CREATININE 0.54* 0.50* 0.61  CALCIUM 8.4*  --  8.8*  MG  --   --  1.9  PHOS  --   --  2.9   GFR: Estimated Creatinine Clearance: 137.1 mL/min (by C-G formula based on SCr of 0.61 mg/dL). Liver Function Tests: Recent Labs  Lab 01/05/22 0056  AST 14*  ALT 12  ALKPHOS 33*  BILITOT 0.2*  PROT 6.7  ALBUMIN 3.3*   No results for input(s): "LIPASE", "AMYLASE" in the last 168 hours. No results for input(s): "AMMONIA" in the last 168 hours. Coagulation Profile: No results for input(s): "INR", "PROTIME" in the last 168 hours. Cardiac Enzymes: No results for input(s): "CKTOTAL", "CKMB", "CKMBINDEX", "TROPONINI" in the last 168 hours. BNP (last 3 results) No results for input(s): "PROBNP" in the last 8760 hours. HbA1C: Recent Labs    01/06/22 0502  HGBA1C 6.3*   CBG: Recent Labs  Lab 01/05/22 0754  01/05/22 1140 01/05/22 1703 01/05/22 2108 01/06/22 0802  GLUCAP 139* 126* 144* 161* 145*   Lipid Profile: No results for input(s): "CHOL", "HDL", "LDLCALC", "TRIG", "CHOLHDL", "LDLDIRECT" in the last 72 hours. Thyroid Function Tests: No results for input(s): "TSH", "T4TOTAL", "FREET4", "T3FREE", "THYROIDAB" in the last 72 hours. Anemia Panel: No results for input(s): "VITAMINB12", "FOLATE", "FERRITIN", "TIBC", "IRON", "RETICCTPCT" in the last 72 hours. Sepsis Labs: No results for input(s): "PROCALCITON", "LATICACIDVEN" in the last 168 hours.  Recent Results (from the past 240 hour(s))  Urine Culture     Status: Abnormal (Preliminary result)   Collection Time: 01/04/22  8:29 PM   Specimen: Urine, Clean Catch  Result Value Ref Range Status   Specimen Description   Final    URINE, CLEAN CATCH Performed at [facility], [place] [address] [place], [place] [address]    Special Requests   Final    NONE Performed at [facility], [place] [address], [place], [place] [address]    Culture (A)  Final    >=100,000 COLONIES/mL PROTEUS MIRABILIS SUSCEPTIBILITIES TO FOLLOW Performed at [facility], [place] [address], [place], [place] [address]    Report Status PENDING  Incomplete         Radiology Studies: CT Renal Stone Study  Result Date: 01/05/2022 CLINICAL DATA:  Flank pain, kidney stone suspected EXAM: CT ABDOMEN AND PELVIS WITHOUT CONTRAST TECHNIQUE: Multidetector CT imaging of the abdomen and pelvis was performed following the standard protocol without IV contrast. RADIATION DOSE REDUCTION: This exam was performed according to the departmental dose-optimization program which includes automated exposure control, adjustment of the mA and/or kV according to patient size and/or use of iterative reconstruction technique. COMPARISON:  05/31/2019 FINDINGS: Lower chest: Bibasilar scarring.  Mild cardiomegaly.  No effusions. Hepatobiliary: Small layering  gallstone within the gallbladder, similar to prior study. No focal hepatic abnormality or biliary ductal dilatation. Pancreas: No focal abnormality or ductal dilatation. Spleen: No focal abnormality.  Normal size. Adrenals/Urinary Tract: 8 mm nonobstructing stone in the lower pole of the right kidney. No ureteral stones or hydronephrosis. No suspicious renal or adrenal mass. Urinary bladder unremarkable. Stomach/Bowel: Prior partial colectomy. Right lower quadrant and left lower quadrant ostomy sites again noted, unchanged. No evidence of bowel obstruction. Stomach unremarkable. Vascular/Lymphatic: Scattered aortic calcifications. No evidence of aneurysm or adenopathy. Reproductive: No visible focal abnormality. Other: No free fluid or free air. Musculoskeletal: No acute bony abnormality. IMPRESSION: Bibasilar scarring. Stable cholelithiasis.  No CT evidence of acute cholecystitis. Right nephrolithiasis.  No hydronephrosis.  No acute findings. Electronically Signed   By: Rolm Baptise M.D.   On: 01/05/2022 00:34   DG Chest Portable 1 View  Result Date: 01/05/2022 CLINICAL DATA:  Chest pain EXAM: PORTABLE CHEST 1 VIEW COMPARISON:  05/21/2014 FINDINGS: Cardiac shadow is mildly enlarged. Central vascular congestion is noted. No significant edema is seen. No bony abnormality is noted. IMPRESSION: Changes of mild CHF.  No edema is noted. Electronically Signed   By: Inez Catalina M.D.   On: 01/05/2022 00:10        Scheduled Meds:  atorvastatin  10 mg Oral QHS   calcium-vitamin D  1 tablet Oral Daily   cholecalciferol  1,000 Units Oral Daily   dabigatran  150 mg Oral BID   diltiazem  240 mg Oral Daily   doxepin  150 mg Oral QHS   DULoxetine  60 mg Oral Daily   fenofibrate  160 mg Oral Daily   ferrous sulfate  325 mg Oral Daily   gabapentin  300 mg Oral QAC lunch   gabapentin  600 mg Oral QPC supper   insulin aspart  0-5 Units Subcutaneous QHS   insulin aspart  0-9 Units Subcutaneous TID WC    lamoTRIgine  150 mg Oral BID   levETIRAcetam  1,500 mg Oral BID   losartan  25 mg Oral BID   multivitamin with minerals  1 tablet Oral Daily   omega-3 acid ethyl esters  1 g Oral BID   Zinc Oxide   Topical TID   Continuous Infusions:  cefTRIAXone (ROCEPHIN)  IV 2 g (01/05/22 2309)     LOS: 0 days    Time spent: 45 minutes spent on chart review, discussion with nursing staff, consultants, updating family and interview/physical exam; more than 50% of that time was spent in counseling and/or coordination of care.    Geradine Girt, DO [facility] Available via Epic secure chat 7am-7pm After these hours, please refer to coverage provider listed on [email or web address] 01/06/2022, 11:11 AM

## 2022-01-06 NOTE — Consult Note (Signed)
WOC Nurse Consult Note: Patient receiving care in [place]. Reason for Consult: stage 2 on sacrum and buttocks Wound type: the left buttocks has scattered areas that appear as abrasions. The patient tells me he has both eczema and psoriasis. There are patches of these skin conditions at many places on the buttocks, sacrum, legs Pressure Injury POA: NA Measurement: The entire area that has abrasions measures 13 cm x 13 cm Wound bed: pink, surrounded by blanchable purple patch with many area of brown crusting patches Drainage (amount, consistency, odor) none Periwound: as described Dressing procedure/placement/frequency: I have ordered three times daily application of Triple Paste for the left buttock area.  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 On this they use a one piece, cut to fit pouch. The current pouch is intact without s/s of impending leakage. 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 He tells me this rarely puts out anything. I assisted the primary RN to find a one piece pouch and barrier rings for use on the ileostomy site.  Monitor the wound area(s) for worsening of condition such as: Signs/symptoms of infection,  Increase in size,  Development of or worsening of odor, Development of pain, or increased pain at the affected locations.  Notify the medical team if any of these develop.  Thank you for the consult.  Discussed plan of care with the patient and bedside nurse.  [place] nurse will not follow at this time.  Please re-consult the [place] team if needed.  Val Riles, RN, MSN, CWOCN, CNS-BC, pager [phone]

## 2022-01-07 DIAGNOSIS — I509 Heart failure, unspecified: Secondary | ICD-10-CM

## 2022-01-07 DIAGNOSIS — N3000 Acute cystitis without hematuria: Secondary | ICD-10-CM | POA: Diagnosis not present

## 2022-01-07 LAB — BASIC METABOLIC PANEL
Anion gap: 9 (ref 5–15)
BUN: 12 mg/dL (ref 8–23)
CO2: 27 mmol/L (ref 22–32)
Calcium: 9.5 mg/dL (ref 8.9–10.3)
Chloride: 105 mmol/L (ref 98–111)
Creatinine, Ser: 0.61 mg/dL (ref 0.61–1.24)
GFR, Estimated: >60 mL/min (ref >60)
Glucose, Bld: 140 mg/dL — ABNORMAL HIGH (ref 70–99)
Potassium: 3.8 mmol/L (ref 3.5–5.1)
Sodium: 141 mmol/L (ref 135–145)

## 2022-01-07 LAB — GLUCOSE, CAPILLARY
Glucose-Capillary: 161 mg/dL — ABNORMAL HIGH (ref 70–99)
Glucose-Capillary: 192 mg/dL — ABNORMAL HIGH (ref 70–99)
Glucose-Capillary: 166 mg/dL — ABNORMAL HIGH (ref 70–99)
Glucose-Capillary: 115 mg/dL — ABNORMAL HIGH (ref 70–99)

## 2022-01-07 LAB — CBC
HCT: 43.1 % (ref 39.0–52.0)
Hemoglobin: 13.3 g/dL (ref 13.0–17.0)
MCH: 27.4 pg (ref 26.0–34.0)
MCHC: 30.9 g/dL (ref 30.0–36.0)
MCV: 88.9 fL (ref 80.0–100.0)
Platelets: 289 10*3/uL (ref 150–400)
RBC: 4.85 MIL/uL (ref 4.22–5.81)
RDW: 15.4 % (ref 11.5–15.5)
WBC: 10.4 10*3/uL (ref 4.0–10.5)
nRBC: 0 % (ref 0.0–0.2)

## 2022-01-07 MED ORDER — MORPHINE SULFATE (PF) 2 MG/ML IV SOLN
1.0000 mg | INTRAVENOUS | Status: DC | PRN
  Administered 2022-01-07 – 2022-01-08 (×3): 1 mg via INTRAVENOUS
  Filled 2022-01-07 (×3): qty 1

## 2022-01-07 NOTE — Consult Note (Signed)
[place] Nurse ostomy follow up Patient receiving care in [place] 1512 One piece ostomy pouches from hospital stock in room ready for use. Patient explains his sister came yesterday and used his home pouching supplies and changed his ostomy pouch yesterday. I assisted him with emptying it today. He has formed and semi-formed stool from the ostomy.  No further needs identified.    [place] nurse will not follow at this time.  Please re-consult the [place] team if needed.  Val Riles, RN, MSN, CWOCN, CNS-BC, pager [phone]

## 2022-01-07 NOTE — Progress Notes (Signed)
  Transition of Care [facility]) Screening Note   Patient Details  Name: [name] Date of Birth: [birth year]   [facility]) CM/SW Contact:    Vassie Moselle, LCSW Phone Number: 01/07/2022, 11:59 AM    Transition of Care Department [facility]) has reviewed patient and no TOC needs have been identified at this time. We will continue to monitor patient advancement through interdisciplinary progression rounds. If new patient transition needs arise, please place a TOC consult.

## 2022-01-07 NOTE — Progress Notes (Signed)
PROGRESS NOTE    [name]  DTO:[id number] DOB: 23-Sep-1950 DOA: 01/04/2022 PCP: Merlene Laughter, MD    Brief Narrative:  [name] is a 71 y.o. male with medical history significant for paroxysmal A-fib on Pradaxa, history of DVT, peripheral neuropathy, seizure disorder, nonambulatory, hyperlipidemia, bilateral lower extremity venous stasis edema, recurrent UTIs who presented to [facility] ED with complaints of dysuria.  Did a home test at home with concern for UTI.  Also complains of bilateral lower extremity pain from his peripheral neuropathy.     In the ED, UA positive for pyuria.  Bilateral lower extremity edema on exam.  Started on Rocephin.  Await culture.     Assessment and Plan: UTI, POA/pyelonephritis UA positive for pyuria, dysuria also with flank pain. Follow urine culture sensitivities-- proteus Continue Rocephin for now   Seizure disorder Resume home AEDs   Paroxysmal A-fib on Pradaxa Resume home meds    History of DVT on Pradaxa  Resume home regimen   Chronic anxiety/depression Resume home regimen   Hypertension Resume home regimen BP stable.   Prediabetes - insulin sliding scale  Obesity Estimated body mass index is 37.72 kg/m as calculated from the following:   Height as of this encounter: '6\' 5"'$  (1.956 m).   Weight as of this encounter: 144.3 kg.    bed-ridden at home; does not ambulate or transfer    DVT prophylaxis:  dabigatran (PRADAXA) capsule 150 mg    Code Status: Full Code Family Communication: sister at bedside 8/28  Disposition Plan:  Level of care: Telemetry Status is: inpt    Consultants:  none   Subjective: Still c/o flank pain  Objective: Vitals:   01/06/22 0507 01/06/22 1326 01/06/22 2236 01/07/22 0609  BP: (!) 151/95 (!) 172/96 (!) 141/87 (!) 162/92  Pulse: (!) 108 82 98 82  Resp:  17 20 (!) 21  Temp: (!) 97.4 F (36.3 C) 97.7 F (36.5 C) 98.1 F (36.7 C) (!) 97.5 F (36.4 C)  TempSrc: Oral Oral     SpO2: 94% 95% 94% 92%  Weight:      Height:        Intake/Output Summary (Last 24 hours) at 01/07/2022 1300 Last data filed at 01/07/2022 0900 Gross per 24 hour  Intake 820 ml  Output 1000 ml  Net -180 ml   Filed Weights   01/04/22 1950 01/05/22 0353 01/06/22 0500  Weight: (!) 136.1 kg (!) 147.6 kg (!) 144.3 kg    Examination:   General: Appearance:    Obese male in no acute distress     Lungs:     respirations unlabored  Heart:    Normal heart rate.    MS:   All extremities are intact.    Neurologic:   Awake, alert       Data Reviewed: I have personally reviewed following labs and imaging studies  CBC: Recent Labs  Lab 01/04/22 2149 01/05/22 0143 01/06/22 0502 01/07/22 0614  WBC 7.8  --  10.3 10.4  HGB 12.6* 13.3 13.5 13.3  HCT 41.6 39.0 43.0 43.1  MCV 90.8  --  90.1 88.9  PLT 270  --  291 983   Basic Metabolic Panel: Recent Labs  Lab 01/05/22 0056 01/05/22 0143 01/06/22 0502 01/07/22 0614  NA 139 138 136 141  K 3.9 4.0 3.6 3.8  CL 105 99 101 105  CO2 27  --  26 27  GLUCOSE 148* 145* 139* 140*  BUN '11 11 11 '$ 12  CREATININE 0.54* 0.50* 0.61 0.61  CALCIUM 8.4*  --  8.8* 9.5  MG  --   --  1.9  --   PHOS  --   --  2.9  --    GFR: Estimated Creatinine Clearance: 137.1 mL/min (by C-G formula based on SCr of 0.61 mg/dL). Liver Function Tests: Recent Labs  Lab 01/05/22 0056  AST 14*  ALT 12  ALKPHOS 33*  BILITOT 0.2*  PROT 6.7  ALBUMIN 3.3*   No results for input(s): "LIPASE", "AMYLASE" in the last 168 hours. No results for input(s): "AMMONIA" in the last 168 hours. Coagulation Profile: No results for input(s): "INR", "PROTIME" in the last 168 hours. Cardiac Enzymes: No results for input(s): "CKTOTAL", "CKMB", "CKMBINDEX", "TROPONINI" in the last 168 hours. BNP (last 3 results) No results for input(s): "PROBNP" in the last 8760 hours. HbA1C: Recent Labs    01/06/22 0502  HGBA1C 6.3*   CBG: Recent Labs  Lab 01/06/22 1226  01/06/22 1638 01/06/22 2232 01/07/22 0744 01/07/22 1152  GLUCAP 170* 156* 131* 161* 192*   Lipid Profile: No results for input(s): "CHOL", "HDL", "LDLCALC", "TRIG", "CHOLHDL", "LDLDIRECT" in the last 72 hours. Thyroid Function Tests: No results for input(s): "TSH", "T4TOTAL", "FREET4", "T3FREE", "THYROIDAB" in the last 72 hours. Anemia Panel: No results for input(s): "VITAMINB12", "FOLATE", "FERRITIN", "TIBC", "IRON", "RETICCTPCT" in the last 72 hours. Sepsis Labs: No results for input(s): "PROCALCITON", "LATICACIDVEN" in the last 168 hours.  Recent Results (from the past 240 hour(s))  Urine Culture     Status: Abnormal (Preliminary result)   Collection Time: 01/04/22  8:29 PM   Specimen: Urine, Clean Catch  Result Value Ref Range Status   Specimen Description   Final    URINE, CLEAN CATCH Performed at [facility], [place] [address], [place], [place] [address]    Special Requests   Final    NONE Performed at [facility], [place] [address], [place], [place] [address]    Culture (A)  Final    >=100,000 COLONIES/mL PROTEUS MIRABILIS REPEATING SUSCEPTIBILITY Performed at [facility], [place] [address], [place], [place] [address]    Report Status PENDING  Incomplete         Radiology Studies: No results found.      Scheduled Meds:  atorvastatin  10 mg Oral QHS   calcium-vitamin D  1 tablet Oral Daily   cholecalciferol  1,000 Units Oral Daily   dabigatran  150 mg Oral BID   diltiazem  240 mg Oral Daily   doxepin  150 mg Oral QHS   DULoxetine  60 mg Oral Daily   fenofibrate  160 mg Oral Daily   ferrous sulfate  325 mg Oral Daily   gabapentin  300 mg Oral QAC lunch   gabapentin  600 mg Oral QPC supper   insulin aspart  0-5 Units Subcutaneous QHS   insulin aspart  0-9 Units Subcutaneous TID WC   lamoTRIgine  150 mg Oral BID   levETIRAcetam  1,500 mg Oral BID   losartan  25 mg Oral BID   multivitamin with minerals  1 tablet  Oral Daily   omega-3 acid ethyl esters  1 g Oral BID   Zinc Oxide   Topical TID   Continuous Infusions:  cefTRIAXone (ROCEPHIN)  IV 2 g (01/06/22 2231)     LOS: 1 day    Time spent: 45 minutes spent on chart review, discussion with nursing staff, consultants, updating family and interview/physical exam;  more than 50% of that time was spent in counseling and/or coordination of care.    Geradine Girt, DO [facility] Available via Epic secure chat 7am-7pm After these hours, please refer to coverage provider listed on [email or web address] 01/07/2022, 1:00 PM

## 2022-01-08 DIAGNOSIS — N3000 Acute cystitis without hematuria: Secondary | ICD-10-CM | POA: Diagnosis not present

## 2022-01-08 LAB — GLUCOSE, CAPILLARY
Glucose-Capillary: 135 mg/dL — ABNORMAL HIGH (ref 70–99)
Glucose-Capillary: 164 mg/dL — ABNORMAL HIGH (ref 70–99)
Glucose-Capillary: 129 mg/dL — ABNORMAL HIGH (ref 70–99)
Glucose-Capillary: 135 mg/dL — ABNORMAL HIGH (ref 70–99)

## 2022-01-08 NOTE — TOC Progression Note (Signed)
Transition of Care [facility]) - Progression Note    Patient Details  Name: [place] KORVER MRN: [id number] Date of Birth: Nov 01, 1950  Transition of Care [facility]) CM/SW [place], [place] Phone Number: 01/08/2022, 1:09 PM  Clinical Narrative:    Pt is active with [facility] for wound care. Pt will need resumption of care orders placed for [facility] prior to discharge.        Expected Discharge Plan and Services                                                 Social Determinants of Health (SDOH) Interventions    Readmission Risk Interventions    01/07/2022   11:09 AM  Readmission Risk Prevention Plan  Post Dischage Appt Complete  Medication Screening Complete  Transportation Screening Complete

## 2022-01-08 NOTE — Progress Notes (Signed)
[facility] PROGRESS NOTE   [name] CHE:[id number] DOB: Apr 04, 1951 DOA: 01/04/2022  PCP: Merlene Laughter, MD  Brief History/Interval Summary: 71 y.o. male with medical history significant for paroxysmal A-fib on Pradaxa, history of DVT, peripheral neuropathy, seizure disorder, nonambulatory, hyperlipidemia, bilateral lower extremity venous stasis edema, recurrent UTIs who presented to [facility] ED with complaints of dysuria.  Did a home test at home with concern for UTI.  Also complains of bilateral lower extremity pain from his peripheral neuropathy.     In the ED, UA positive for pyuria.  Bilateral lower extremity edema on exam.  Started on Rocephin.  Await culture.     Consultants: None  Procedures: None    Subjective/Interval History: Patient denies any complaints this morning.  Abdominal pain seems to be improving.  Denies any nausea vomiting.  No shortness of breath.     Assessment/Plan:  Urinary tract infection with Proteus/acute pyelonephritis Urine culture growing Proteus.  Await sensitivities.  Continue with ceftriaxone for now.  Symptomatically he seems to be improving.  Seizure disorder Noted to be on Keppra and Lamictal which is being continued.  Paroxysmal atrial fibrillation Stable.  Noted to be on diltiazem and Pradaxa.  History of DVT Continue Pradaxa  Chronic anxiety and depression Continue home medications including Cymbalta of gabapentin.  Essential hypertension Monitor blood pressures closely.  Obesity Estimated body mass index is 38.06 kg/m as calculated from the following:   Height as of this encounter: '6\' 5"'$  (1.956 m).   Weight as of this encounter: 145.6 kg.   DVT Prophylaxis: On Pradaxa Code Status: Full code Family Communication: Discussed with patient Disposition Plan: Hopefully return home in 24 to 48 hours  Status is: Inpatient Remains inpatient appropriate because: UTI, await urine cultures      Medications:  Scheduled:  atorvastatin  10 mg Oral QHS   calcium-vitamin D  1 tablet Oral Daily   cholecalciferol  1,000 Units Oral Daily   dabigatran  150 mg Oral BID   diltiazem  240 mg Oral Daily   doxepin  150 mg Oral QHS   DULoxetine  60 mg Oral Daily   fenofibrate  160 mg Oral Daily   ferrous sulfate  325 mg Oral Daily   gabapentin  300 mg Oral QAC lunch   gabapentin  600 mg Oral QPC supper   insulin aspart  0-5 Units Subcutaneous QHS   insulin aspart  0-9 Units Subcutaneous TID WC   lamoTRIgine  150 mg Oral BID   levETIRAcetam  1,500 mg Oral BID   losartan  25 mg Oral BID   multivitamin with minerals  1 tablet Oral Daily   omega-3 acid ethyl esters  1 g Oral BID   Zinc Oxide   Topical TID   Continuous:  cefTRIAXone (ROCEPHIN)  IV 2 g (01/07/22 2259)   NTI:[id number], alum & mag hydroxide-simeth, morphine injection, oxyCODONE, polyethylene glycol, prochlorperazine, triamcinolone 0.1 % cream : eucerin  Antibiotics: Anti-infectives (From admission, onward)    Start     Dose/Rate Route Frequency Ordered Stop   01/05/22 2200  cefTRIAXone (ROCEPHIN) 2 g in sodium chloride 0.9 % 100 mL IVPB        2 g 200 mL/hr over 30 Minutes Intravenous Every 24 hours 01/05/22 0207     01/05/22 0100  cefTRIAXone (ROCEPHIN) 1 g in sodium chloride 0.9 % 100 mL IVPB        1 g 200 mL/hr over 30 Minutes Intravenous  Once 01/05/22 0047 01/05/22  0158       Objective:  Vital Signs  Vitals:   01/07/22 2102 01/08/22 0422 01/08/22 0453 01/08/22 0749  BP: 131/81  139/89 (!) 157/87  Pulse: 90  98 98  Resp: (!) 24  (!) 22 20  Temp: 97.9 F (36.6 C)  97.6 F (36.4 C) (!) 97.4 F (36.3 C)  TempSrc: Oral  Oral Oral  SpO2: 90%  90% 92%  Weight:  (!) 145.6 kg    Height:        Intake/Output Summary (Last 24 hours) at 01/08/2022 1147 Last data filed at 01/08/2022 0957 Gross per 24 hour  Intake 358 ml  Output 850 ml  Net -492 ml   Filed Weights   01/05/22 0353 01/06/22 0500 01/08/22 0422  Weight:  (!) 147.6 kg (!) 144.3 kg (!) 145.6 kg    General appearance: Awake alert.  In no distress Resp: Clear to auscultation bilaterally.  Normal effort Cardio: S1-S2 is normal regular.  No S3-S4.  No rubs murmurs or bruit GI: Abdomen is soft.  Nontender nondistended.  Bowel sounds are present normal.  No masses organomegaly Extremities: No edema.  Full range of motion of lower extremities. Neurologic: .  No focal neurological deficits.    Lab Results:  Data Reviewed: I have personally reviewed following labs and reports of the imaging studies  CBC: Recent Labs  Lab 01/04/22 2149 01/05/22 0143 01/06/22 0502 01/07/22 0614  WBC 7.8  --  10.3 10.4  HGB 12.6* 13.3 13.5 13.3  HCT 41.6 39.0 43.0 43.1  MCV 90.8  --  90.1 88.9  PLT 270  --  291 235    Basic Metabolic Panel: Recent Labs  Lab 01/05/22 0056 01/05/22 0143 01/06/22 0502 01/07/22 0614  NA 139 138 136 141  K 3.9 4.0 3.6 3.8  CL 105 99 101 105  CO2 27  --  26 27  GLUCOSE 148* 145* 139* 140*  BUN '11 11 11 12  '$ CREATININE 0.54* 0.50* 0.61 0.61  CALCIUM 8.4*  --  8.8* 9.5  MG  --   --  1.9  --   PHOS  --   --  2.9  --     GFR: Estimated Creatinine Clearance: 137.7 mL/min (by C-G formula based on SCr of 0.61 mg/dL).  Liver Function Tests: Recent Labs  Lab 01/05/22 0056  AST 14*  ALT 12  ALKPHOS 33*  BILITOT 0.2*  PROT 6.7  ALBUMIN 3.3*    HbA1C: Recent Labs    01/06/22 0502  HGBA1C 6.3*    CBG: Recent Labs  Lab 01/07/22 1152 01/07/22 1720 01/07/22 2113 01/08/22 0754 01/08/22 1124  GLUCAP 192* 166* 115* 135* 164*     Recent Results (from the past 240 hour(s))  Urine Culture     Status: Abnormal (Preliminary result)   Collection Time: 01/04/22  8:29 PM   Specimen: Urine, Clean Catch  Result Value Ref Range Status   Specimen Description   Final    URINE, CLEAN CATCH Performed at [facility], [place] [address], [place], [place] [address]    Special Requests   Final     NONE Performed at [facility], [place] [address] [place], [place] [address]    Culture (A)  Final    >=100,000 COLONIES/mL PROTEUS MIRABILIS REPEATING SUSCEPTIBILITY Performed at [facility], [place] [address] [place], [place] [address]    Report Status PENDING  Incomplete      Radiology Studies: No results  found.     LOS: 2 days   [facility] Pager on [email or web address]  01/08/2022, 11:47 AM

## 2022-01-09 DIAGNOSIS — I480 Paroxysmal atrial fibrillation: Secondary | ICD-10-CM

## 2022-01-09 DIAGNOSIS — N3000 Acute cystitis without hematuria: Secondary | ICD-10-CM | POA: Diagnosis not present

## 2022-01-09 LAB — GLUCOSE, CAPILLARY
Glucose-Capillary: 135 mg/dL — ABNORMAL HIGH (ref 70–99)
Glucose-Capillary: 115 mg/dL — ABNORMAL HIGH (ref 70–99)
Glucose-Capillary: 121 mg/dL — ABNORMAL HIGH (ref 70–99)

## 2022-01-09 NOTE — Care Management Important Message (Signed)
Important Message  Patient Details IM Letter given to the Patient. Name: [name] MRN: [id number] Date of Birth: Aug 27, 1950   Medicare Important Message Given:  Yes     Kerin Salen 01/09/2022, 11:46 AM

## 2022-01-09 NOTE — Progress Notes (Signed)
[facility] PROGRESS NOTE   [name] JKD:[id number] DOB: [birth year] DOA: 01/04/2022  PCP: Merlene Laughter, MD  Brief History/Interval Summary: 71 y.o. male with medical history significant for paroxysmal A-fib on Pradaxa, history of DVT, peripheral neuropathy, seizure disorder, nonambulatory, hyperlipidemia, bilateral lower extremity venous stasis edema, recurrent UTIs who presented to [facility] ED with complaints of dysuria.  Did a home test at home with concern for UTI.  Also complains of bilateral lower extremity pain from his peripheral neuropathy.     In the ED, UA positive for pyuria.  Bilateral lower extremity edema on exam.  Started on Rocephin.  Await culture.     Consultants: None  Procedures: None    Subjective/Interval History: Denies any complaints.  Specifically no chest pain or shortness of breath.  No abdominal pain.     Assessment/Plan:  Urinary tract infection with Proteus/acute pyelonephritis Urine culture growing Proteus.  Continue to wait on susceptibilities..  Continue with ceftriaxone for now.  Symptomatically he seems to be improving.  Seizure disorder Noted to be on Keppra and Lamictal which is being continued.  Paroxysmal atrial fibrillation Noted to be on diltiazem and Pradaxa.  Occasional tachycardia is noted.  History of DVT Continue Pradaxa  Chronic anxiety and depression Continue home medications including Cymbalta of gabapentin.  Essential hypertension Blood pressure is reasonably well controlled.  Obesity Estimated body mass index is 37.95 kg/m as calculated from the following:   Height as of this encounter: '6\' 5"'$  (1.956 m).   Weight as of this encounter: 145.2 kg.   DVT Prophylaxis: On Pradaxa Code Status: Full code Family Communication: Discussed with patient Disposition Plan: Hopefully home in the 1 to 2 days once we have the results of the urine culture susceptibilities.  Status is: Inpatient Remains inpatient  appropriate because: UTI, await urine cultures      Medications: Scheduled:  atorvastatin  10 mg Oral QHS   calcium-vitamin D  1 tablet Oral Daily   cholecalciferol  1,000 Units Oral Daily   dabigatran  150 mg Oral BID   diltiazem  240 mg Oral Daily   doxepin  150 mg Oral QHS   DULoxetine  60 mg Oral Daily   fenofibrate  160 mg Oral Daily   ferrous sulfate  325 mg Oral Daily   gabapentin  300 mg Oral QAC lunch   gabapentin  600 mg Oral QPC supper   insulin aspart  0-5 Units Subcutaneous QHS   insulin aspart  0-9 Units Subcutaneous TID WC   lamoTRIgine  150 mg Oral BID   levETIRAcetam  1,500 mg Oral BID   losartan  25 mg Oral BID   multivitamin with minerals  1 tablet Oral Daily   omega-3 acid ethyl esters  1 g Oral BID   Zinc Oxide   Topical TID   Continuous:  cefTRIAXone (ROCEPHIN)  IV Stopped (01/08/22 2303)   [id number], alum & mag hydroxide-simeth, morphine injection, oxyCODONE, polyethylene glycol, prochlorperazine, triamcinolone 0.1 % cream : eucerin  Antibiotics: Anti-infectives (From admission, onward)    Start     Dose/Rate Route Frequency Ordered Stop   01/05/22 2200  cefTRIAXone (ROCEPHIN) 2 g in sodium chloride 0.9 % 100 mL IVPB        2 g 200 mL/hr over 30 Minutes Intravenous Every 24 hours 01/05/22 0207     01/05/22 0100  cefTRIAXone (ROCEPHIN) 1 g in sodium chloride 0.9 % 100 mL IVPB        1 g 200  mL/hr over 30 Minutes Intravenous  Once 01/05/22 0047 01/05/22 0158       Objective:  Vital Signs  Vitals:   01/08/22 1406 01/08/22 2022 01/09/22 0534 01/09/22 0626  BP: 133/88 128/82 (!) 146/87   Pulse: 78 93 98   Resp: '20 20 18   '$ Temp: 97.9 F (36.6 C) 97.7 F (36.5 C) 97.9 F (36.6 C)   TempSrc: Oral     SpO2: 90% 92% 90%   Weight:    (!) 145.2 kg  Height:        Intake/Output Summary (Last 24 hours) at 01/09/2022 1040 Last data filed at 01/09/2022 0800 Gross per 24 hour  Intake 1045 ml  Output 875 ml  Net 170 ml    Filed  Weights   01/06/22 0500 01/08/22 0422 01/09/22 0626  Weight: (!) 144.3 kg (!) 145.6 kg (!) 145.2 kg    General appearance: Awake alert.  In no distress Resp: Clear to auscultation bilaterally.  Normal effort Cardio: S1-S2 is normal regular.  No S3-S4.  No rubs murmurs or bruit GI: Abdomen is soft.  Ostomy is noted.   Lab Results:  Data Reviewed: I have personally reviewed following labs and reports of the imaging studies  CBC: Recent Labs  Lab 01/04/22 2149 01/05/22 0143 01/06/22 0502 01/07/22 0614  WBC 7.8  --  10.3 10.4  HGB 12.6* 13.3 13.5 13.3  HCT 41.6 39.0 43.0 43.1  MCV 90.8  --  90.1 88.9  PLT 270  --  291 289     Basic Metabolic Panel: Recent Labs  Lab 01/05/22 0056 01/05/22 0143 01/06/22 0502 01/07/22 0614  NA 139 138 136 141  K 3.9 4.0 3.6 3.8  CL 105 99 101 105  CO2 27  --  26 27  GLUCOSE 148* 145* 139* 140*  BUN '11 11 11 12  '$ CREATININE 0.54* 0.50* 0.61 0.61  CALCIUM 8.4*  --  8.8* 9.5  MG  --   --  1.9  --   PHOS  --   --  2.9  --      GFR: Estimated Creatinine Clearance: 137.4 mL/min (by C-G formula based on SCr of 0.61 mg/dL).  Liver Function Tests: Recent Labs  Lab 01/05/22 0056  AST 14*  ALT 12  ALKPHOS 33*  BILITOT 0.2*  PROT 6.7  ALBUMIN 3.3*     CBG: Recent Labs  Lab 01/08/22 0754 01/08/22 1124 01/08/22 1658 01/08/22 2017 01/09/22 0756  GLUCAP 135* 164* 129* 135* 135*      Recent Results (from the past 240 hour(s))  Urine Culture     Status: Abnormal (Preliminary result)   Collection Time: 01/04/22  8:29 PM   Specimen: Urine, Clean Catch  Result Value Ref Range Status   Specimen Description   Final    URINE, CLEAN CATCH Performed at [facility], [place] [address] [place], [place] [address]    Special Requests   Final    NONE Performed at [facility], [place] [address] [place], [place] [address]    Culture (A)  Final    >=100,000 COLONIES/mL PROTEUS MIRABILIS Sent to  [place] for further susceptibility testing. Performed at [facility], [place] [address] [place],  [address]    Report Status PENDING  Incomplete      Radiology Studies: No results found.     LOS: 3 days   Kena Limon [employer] on [email or web address]  01/09/2022, 10:40 AM

## 2022-01-09 NOTE — Plan of Care (Signed)

## 2022-01-10 DIAGNOSIS — N3000 Acute cystitis without hematuria: Secondary | ICD-10-CM | POA: Diagnosis not present

## 2022-01-10 LAB — GLUCOSE, CAPILLARY
Glucose-Capillary: 130 mg/dL — ABNORMAL HIGH (ref 70–99)
Glucose-Capillary: 140 mg/dL — ABNORMAL HIGH (ref 70–99)

## 2022-01-10 MED ORDER — FOSFOMYCIN TROMETHAMINE 3 G PO PACK
3.0000 g | PACK | Freq: Once | ORAL | Status: AC
  Administered 2022-01-10: 3 g via ORAL
  Filled 2022-01-10: qty 3

## 2022-01-10 NOTE — Discharge Summary (Signed)
[facility]  Physician Discharge Summary   Patient ID: [name] MRN: [id number] DOB/AGE: Dec 09, 1950 71 y.o.  Admit date: 01/04/2022 Discharge date: 01/10/2022    PCP: Merlene Laughter, MD  DISCHARGE DIAGNOSES:    UTI (urinary tract infection) Seizure disorder Paroxysmal atrial fibrillation History of DVT on anticoagulation Essential hypertension   RECOMMENDATIONS FOR OUTPATIENT FOLLOW UP: Urine culture sensitivity report will need to be followed up on.   Home Health: RN Equipment/Devices: None  CODE STATUS: Full code  DISCHARGE CONDITION: fair  Diet recommendation: As before  INITIAL HISTORY: 71 y.o. male with medical history significant for paroxysmal A-fib on Pradaxa, history of DVT, peripheral neuropathy, seizure disorder, nonambulatory, hyperlipidemia, bilateral lower extremity venous stasis edema, recurrent UTIs who presented to [facility] ED with complaints of dysuria.  Did a home test at home with concern for UTI.  Also complains of bilateral lower extremity pain from his peripheral neuropathy.      HOSPITAL COURSE:   Urinary tract infection with Proteus/acute pyelonephritis Patient started on ceftriaxone.  Urine culture growing Proteus.  Culture to be sent out to outside lab due to technical issues.  Patient has improved with ceftriaxone.  He will be given a dose of fosfomycin to complete 7-day treatment.  Sensitivity report is not available at the time of discharge.     Seizure disorder Noted to be on Keppra and Lamictal   Paroxysmal atrial fibrillation Continue diltiazem and Pradaxa.    History of DVT Continue Pradaxa   Chronic anxiety and depression Continue home medications including Cymbalta and gabapentin.   Essential hypertension Blood pressure is reasonably well controlled.   Obesity Estimated body mass index is 37.95 kg/m as calculated from the following:   Height as of this encounter: 6' 5"  (1.956 m).   Weight as of this  encounter: 145.2 kg.   Patient is stable.  Has improved.  Feels better.  Okay for discharge home today.  PERTINENT LABS:  The results of significant diagnostics from this hospitalization (including imaging, microbiology, ancillary and laboratory) are listed below for reference.    Microbiology: Recent Results (from the past 240 hour(s))  Susceptibility, Aer + Anaerob     Status: Abnormal   Collection Time: 01/04/22 12:30 AM  Result Value Ref Range Status   Suscept, Aer + Anaerob Preliminary report (A)  Final    Comment: (NOTE) Performed At: [facility] [place], [place] [id number] Bruce Farmer MD HR:[id number]    Source of Sample [phone]  Final    Comment: Performed at [facility], [place] [address] [place], [place] [address]  Susceptibility Result     Status: Abnormal   Collection Time: 01/04/22 12:30 AM  Result Value Ref Range Status   Suscept Result 1 Proteus mirabilis (A)  Final    Comment: (NOTE) Identification performed by account, not confirmed by this laboratory. Performed At: [facility] [place], [place] [id number] Bruce Farmer MD UG:[id number]   Urine Culture     Status: Abnormal (Preliminary result)   Collection Time: 01/04/22  8:29 PM   Specimen: Urine, Clean Catch  Result Value Ref Range Status   Specimen Description   Final    URINE, CLEAN CATCH Performed at [facility], [place] [address], [place], [place] [address]    Special Requests   Final    NONE Performed at [facility], [place] [address] [place], [place] [address]    Culture (A)  Final    >=  100,000 COLONIES/mL PROTEUS MIRABILIS Sent to [place] for further susceptibility testing. Performed at [facility], [place] [address] [place], [place] [address]    Report Status PENDING  Incomplete     Labs:   Basic Metabolic Panel: Recent Labs  Lab 01/05/22 0056 01/05/22 0143 01/06/22 0502 01/07/22 0614   NA 139 138 136 141  K 3.9 4.0 3.6 3.8  CL 105 99 101 105  CO2 27  --  26 27  GLUCOSE 148* 145* 139* 140*  BUN 11 11 11 12   CREATININE 0.54* 0.50* 0.61 0.61  CALCIUM 8.4*  --  8.8* 9.5  MG  --   --  1.9  --   PHOS  --   --  2.9  --    Liver Function Tests: Recent Labs  Lab 01/05/22 0056  AST 14*  ALT 12  ALKPHOS 33*  BILITOT 0.2*  PROT 6.7  ALBUMIN 3.3*    CBC: Recent Labs  Lab 01/04/22 2149 01/05/22 0143 01/06/22 0502 01/07/22 0614  WBC 7.8  --  10.3 10.4  HGB 12.6* 13.3 13.5 13.3  HCT 41.6 39.0 43.0 43.1  MCV 90.8  --  90.1 88.9  PLT 270  --  291 289     CBG: Recent Labs  Lab 01/09/22 0756 01/09/22 1120 01/09/22 1641 01/10/22 0056 01/10/22 0746  GLUCAP 135* 115* 121* 140* 130*     IMAGING STUDIES CT Renal Stone Study  Result Date: 01/05/2022 CLINICAL DATA:  Flank pain, kidney stone suspected EXAM: CT ABDOMEN AND PELVIS WITHOUT CONTRAST TECHNIQUE: Multidetector CT imaging of the abdomen and pelvis was performed following the standard protocol without IV contrast. RADIATION DOSE REDUCTION: This exam was performed according to the departmental dose-optimization program which includes automated exposure control, adjustment of the mA and/or kV according to patient size and/or use of iterative reconstruction technique. COMPARISON:  05/31/2019 FINDINGS: Lower chest: Bibasilar scarring.  Mild cardiomegaly.  No effusions. Hepatobiliary: Small layering gallstone within the gallbladder, similar to prior study. No focal hepatic abnormality or biliary ductal dilatation. Pancreas: No focal abnormality or ductal dilatation. Spleen: No focal abnormality.  Normal size. Adrenals/Urinary Tract: 8 mm nonobstructing stone in the lower pole of the right kidney. No ureteral stones or hydronephrosis. No suspicious renal or adrenal mass. Urinary bladder unremarkable. Stomach/Bowel: Prior partial colectomy. Right lower quadrant and left lower quadrant ostomy sites again noted,  unchanged. No evidence of bowel obstruction. Stomach unremarkable. Vascular/Lymphatic: Scattered aortic calcifications. No evidence of aneurysm or adenopathy. Reproductive: No visible focal abnormality. Other: No free fluid or free air. Musculoskeletal: No acute bony abnormality. IMPRESSION: Bibasilar scarring. Stable cholelithiasis.  No CT evidence of acute cholecystitis. Right nephrolithiasis.  No hydronephrosis. No acute findings. Electronically Signed   By: Rolm Baptise M.D.   On: 01/05/2022 00:34   DG Chest Portable 1 View  Result Date: 01/05/2022 CLINICAL DATA:  Chest pain EXAM: PORTABLE CHEST 1 VIEW COMPARISON:  05/21/2014 FINDINGS: Cardiac shadow is mildly enlarged. Central vascular congestion is noted. No significant edema is seen. No bony abnormality is noted. IMPRESSION: Changes of mild CHF.  No edema is noted. Electronically Signed   By: Inez Catalina M.D.   On: 01/05/2022 00:10    DISCHARGE EXAMINATION: Vitals:   01/09/22 1441 01/10/22 0500 01/10/22 0526 01/10/22 1051  BP: 135/72  (!) 145/86 (!) 147/90  Pulse: 67  75 (!) 102  Resp: 18     Temp: 97.7 F (36.5 C)  (!) 97.3 F (36.3 C) 98 F (36.7  C)  TempSrc: Oral  Oral Oral  SpO2: 91%  92% 97%  Weight:  (!) 145.1 kg    Height:       General appearance: Awake alert.  In no distress Resp: Clear to auscultation bilaterally.  Normal effort Cardio: S1-S2 is normal regular.  No S3-S4.  No rubs murmurs or bruit GI: Abdomen is soft.  Nontender nondistended.  Bowel sounds are present normal.  No masses organomegaly   DISPOSITION: Home  Discharge Instructions     Call MD for:  difficulty breathing, headache or visual disturbances   Complete by: As directed    Call MD for:  extreme fatigue   Complete by: As directed    Call MD for:  hives   Complete by: As directed    Call MD for:  persistant dizziness or light-headedness   Complete by: As directed    Call MD for:  persistant nausea and vomiting   Complete by: As directed     Call MD for:  severe uncontrolled pain   Complete by: As directed    Call MD for:  temperature >100.4   Complete by: As directed    Diet - low sodium heart healthy   Complete by: As directed    Discharge instructions   Complete by: As directed    Please be sure to follow-up with primary care provider in the next few weeks.  You were cared for by a hospitalist during your hospital stay. If you have any questions about your discharge medications or the care you received while you were in the hospital after you are discharged, you can call the unit and asked to speak with the hospitalist on call if the hospitalist that took care of you is not available. Once you are discharged, your primary care physician will handle any further medical issues. Please note that NO REFILLS for any discharge medications will be authorized once you are discharged, as it is imperative that you return to your primary care physician (or establish a relationship with a primary care physician if you do not have one) for your aftercare needs so that they can reassess your need for medications and monitor your lab values. If you do not have a primary care physician, you can call [phone] for a physician referral.   Increase activity slowly   Complete by: As directed          Allergies as of 01/10/2022       Reactions   Penicillins Other (See Comments)   Pt has no personal reaction to this med, but his mother/brother do. Has patient had a PCN reaction causing immediate rash, facial/tongue/throat swelling, SOB or lightheadedness with hypotension: No Has patient had a PCN reaction causing severe rash involving mucus membranes or skin necrosis: No Has patient had a PCN reaction that required hospitalization No Has patient had a PCN reaction occurring within the last 10 years: No If all of the above answers are "NO", then may proceed with Cephalosporin        Medication List     TAKE these medications     acetaminophen 500 MG tablet Commonly known as: TYLENOL Take 500-1,000 mg by mouth every 6 (six) hours as needed for mild pain, moderate pain, fever or headache.   ALPHA LIPOIC ACID PO Take 1 capsule by mouth daily.   atorvastatin 10 MG tablet Commonly known as: LIPITOR Take 10 mg by mouth at bedtime.   Calcium Carbonate-Vitamin D 600-400 MG-UNIT tablet Take 1  tablet by mouth daily.   Dilt-XR 240 MG 24 hr capsule Generic drug: diltiazem Take 240 mg by mouth daily.   doxepin 75 MG capsule Commonly known as: SINEQUAN Take 150 mg by mouth at bedtime.   DULoxetine 60 MG capsule Commonly known as: CYMBALTA Take 60 mg by mouth daily.   fenofibrate 160 MG tablet Take 160 mg by mouth daily.   Fish Oil 1000 MG Caps Take 1,000 mg by mouth 2 (two) times a day.   gabapentin 600 MG tablet Commonly known as: NEURONTIN TAKE 1 TABLET BY MOUTH AT LUNCH AND TAKE 2 TABLETS BY MOUTH AT DINNER   glipiZIDE 2.5 MG 24 hr tablet Commonly known as: GLUCOTROL XL Take 2.5 mg by mouth in the morning and at bedtime.   IRON PO Take 1 tablet by mouth daily.   lamoTRIgine 150 MG tablet Commonly known as: LAMICTAL Take 1 tablet (150 mg total) by mouth 2 (two) times daily.   levETIRAcetam 750 MG tablet Commonly known as: KEPPRA Take 2 tablets (1,500 mg total) by mouth 2 (two) times daily. Please call and make overdue appt for further refills. 1st attempt   losartan 25 MG tablet Commonly known as: COZAAR TAKE 1 TABLET BY MOUTH TWICE A DAY   metFORMIN 1000 MG tablet Commonly known as: GLUCOPHAGE Take 1,000 mg by mouth 2 (two) times daily.   multivitamin with minerals Tabs tablet Take 1 tablet by mouth daily.   naloxone 4 MG/0.1ML Liqd nasal spray kit Commonly known as: NARCAN Place 1 spray into the nose once.   oxyCODONE-acetaminophen 10-325 MG tablet Commonly known as: PERCOCET Take 1 tablet by mouth 5 (five) times daily as needed.   Pradaxa 150 MG Caps capsule Generic drug:  dabigatran TAKE 1 CAPSULE BY MOUTH TWICE A DAY   PROBIOTIC ACIDOPHILUS PO Take 1 tablet by mouth daily.   VITAMIN D3 PO Take 1 capsule by mouth daily.          Follow-up Information     [facility] [place] Follow up.   Specialty: Home Health Services Why: [facility] will be providing home health nursing services for your wound care. Contact information: [address] [place] [address] [phone]         Merlene Laughter, MD. Call.   Specialty: Internal Medicine Why: As needed Contact information: [address] [place] [address] [phone]                 TOTAL DISCHARGE TIME: 35 minutes  [place]  [facility] Pager on [email or web address]  01/11/2022, 1:20 PM

## 2022-01-11 LAB — SUSCEPTIBILITY, AER + ANAEROB: Source of Sample: 8680

## 2022-01-11 LAB — SUSCEPTIBILITY RESULT

## 2022-01-14 ENCOUNTER — Other Ambulatory Visit: Payer: Self-pay | Admitting: Internal Medicine

## 2022-01-14 LAB — URINE CULTURE: Culture: >=100000 — AB

## 2022-02-26 ENCOUNTER — Encounter: Payer: Self-pay | Admitting: Neurology

## 2022-02-26 ENCOUNTER — Other Ambulatory Visit: Payer: Self-pay | Admitting: Neurology

## 2022-02-26 DIAGNOSIS — Z7901 Long term (current) use of anticoagulants: Secondary | ICD-10-CM

## 2022-02-26 DIAGNOSIS — R198 Other specified symptoms and signs involving the digestive system and abdomen: Secondary | ICD-10-CM

## 2022-02-26 DIAGNOSIS — G40A01 Absence epileptic syndrome, not intractable, with status epilepticus: Secondary | ICD-10-CM

## 2022-02-26 DIAGNOSIS — G6289 Other specified polyneuropathies: Secondary | ICD-10-CM

## 2022-02-26 DIAGNOSIS — Z86718 Personal history of other venous thrombosis and embolism: Secondary | ICD-10-CM

## 2022-03-09 ENCOUNTER — Other Ambulatory Visit: Payer: Self-pay | Admitting: Neurology

## 2022-03-09 DIAGNOSIS — Z86718 Personal history of other venous thrombosis and embolism: Secondary | ICD-10-CM

## 2022-03-09 DIAGNOSIS — Z7901 Long term (current) use of anticoagulants: Secondary | ICD-10-CM

## 2022-03-09 DIAGNOSIS — G6289 Other specified polyneuropathies: Secondary | ICD-10-CM

## 2022-03-09 DIAGNOSIS — R198 Other specified symptoms and signs involving the digestive system and abdomen: Secondary | ICD-10-CM

## 2022-03-09 DIAGNOSIS — G40A01 Absence epileptic syndrome, not intractable, with status epilepticus: Secondary | ICD-10-CM

## 2022-03-10 ENCOUNTER — Other Ambulatory Visit: Payer: Self-pay

## 2022-03-10 DIAGNOSIS — R198 Other specified symptoms and signs involving the digestive system and abdomen: Secondary | ICD-10-CM

## 2022-03-10 DIAGNOSIS — Z7901 Long term (current) use of anticoagulants: Secondary | ICD-10-CM

## 2022-03-10 DIAGNOSIS — Z86718 Personal history of other venous thrombosis and embolism: Secondary | ICD-10-CM

## 2022-03-10 DIAGNOSIS — G40A01 Absence epileptic syndrome, not intractable, with status epilepticus: Secondary | ICD-10-CM

## 2022-03-10 DIAGNOSIS — G6289 Other specified polyneuropathies: Secondary | ICD-10-CM

## 2022-03-10 MED ORDER — LEVETIRACETAM 750 MG PO TABS: 1500.0000 mg | ORAL_TABLET | Freq: Two times a day (BID) | ORAL | 1 refills | Status: DC

## 2022-03-10 NOTE — Telephone Encounter (Signed)
Pt has been scheduled for tomorrow as a VV. Pt is bed bound according to his sister Bryn Perkin on [place].

## 2022-03-11 ENCOUNTER — Encounter: Payer: Self-pay | Admitting: Neurology

## 2022-03-11 ENCOUNTER — Telehealth (INDEPENDENT_AMBULATORY_CARE_PROVIDER_SITE_OTHER): Payer: Medicare Other | Admitting: Neurology

## 2022-03-11 DIAGNOSIS — A419 Sepsis, unspecified organism: Secondary | ICD-10-CM

## 2022-03-11 DIAGNOSIS — G40A01 Absence epileptic syndrome, not intractable, with status epilepticus: Secondary | ICD-10-CM

## 2022-03-11 DIAGNOSIS — G5793 Unspecified mononeuropathy of bilateral lower limbs: Secondary | ICD-10-CM

## 2022-03-11 DIAGNOSIS — Z7901 Long term (current) use of anticoagulants: Secondary | ICD-10-CM

## 2022-03-11 DIAGNOSIS — N3000 Acute cystitis without hematuria: Secondary | ICD-10-CM

## 2022-03-11 DIAGNOSIS — Z86718 Personal history of other venous thrombosis and embolism: Secondary | ICD-10-CM | POA: Diagnosis not present

## 2022-03-11 DIAGNOSIS — R198 Other specified symptoms and signs involving the digestive system and abdomen: Secondary | ICD-10-CM

## 2022-03-11 DIAGNOSIS — G6289 Other specified polyneuropathies: Secondary | ICD-10-CM

## 2022-03-11 DIAGNOSIS — I4891 Unspecified atrial fibrillation: Secondary | ICD-10-CM

## 2022-03-11 MED ORDER — LEVETIRACETAM 750 MG PO TABS: 1500.0000 mg | ORAL_TABLET | Freq: Two times a day (BID) | ORAL | 1 refills | Status: DC

## 2022-03-11 MED ORDER — GABAPENTIN 600 MG PO TABS: ORAL_TABLET | ORAL | 1 refills | Status: DC

## 2022-03-11 MED ORDER — LAMOTRIGINE 150 MG PO TABS: 150.0000 mg | ORAL_TABLET | Freq: Two times a day (BID) | ORAL | 3 refills | Status: DC

## 2022-03-11 MED ORDER — ALPHA LIPOIC ACID 200 MG PO CAPS: 200.0000 mg | ORAL_CAPSULE | Freq: Two times a day (BID) | ORAL | 1 refills | Status: DC

## 2022-03-11 NOTE — Progress Notes (Signed)
Virtual Visit via Video Note  I connected with [name] on 03/11/22 at 10:30 AM EDT by a video enabled telemedicine application and verified that I am speaking with the correct person using two identifiers.  Location: Patient: at home  Provider: at [facility] office    I discussed the limitations of evaluation and management by telemedicine and the availability of in person appointments. The patient expressed understanding and agreed to proceed.  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  He has non -convulsive seizures, more absence- seizures, these can be triggered by infection a in the past.  The patient was originally followed by Dr. Morene Antu in this office.  He was a patient here for decades.    Observations/Objective: The patient is in his home, with a slight nasal speech, but clearly formed words and normal pronunciation.  Reports pain in his legs at 10 out of 10 sometimes when the next medication dose is due during the day and after medication intake he feels that it is 2 or 3 out of 10.  He has sometimes burning and sharp shooting sensations from the spine to his legs sometimes also into his arms..    He has psoriasis but the lesions on his skin have improved with over-the-counter paste called Psoriasin.  Another triple paste that is a zinc paste has been applied to his legs and has helped the appearance of his legs there is a question if he had a yeast infection spreading to other parts of the body.    As to his wound healing he has been failed by silver dine, collagen, honey paste and now awaits treatment with the hyperbaric chamber at [facility].  Alert and oriented, fluent  speech, no aphasia, denies active pain at the non-healing wound site, but has pain in both lags and buttocks, yeast infection ( diaper rash ) on buttocks.  He is followed for pain management by [facility] medical.  [facility]. '      Assessment and Plan: refilled seizure medication, pain meds are provided by [facility].   Gapapentin 600 mg tid , 1 in AM, 2 in PM. Lamictal and Keppra all refilled.    Follow Up Instructions: RV virtual in 4-6 month.     I discussed the assessment and treatment plan with the patient. The patient was provided an opportunity to ask questions and all were answered. The patient agreed with the plan and demonstrated an understanding of the instructions.   The patient was advised to call back or seek an in-person evaluation if the symptoms worsen or if the condition fails to improve as anticipated.  I provided 25 minutes of non-face-to-face time during this encounter.   [name], MD

## 2022-03-11 NOTE — Patient Instructions (Signed)
Neuropathic Pain Neuropathic pain is pain caused by damage to the nerves that are responsible for certain sensations in your body (sensory nerves). Neuropathic pain can make you more sensitive to pain. Even a minor sensation can feel very painful. This is usually a long-term (chronic) condition that can be difficult to treat. The type of pain differs from person to person. It may: Start suddenly (acute), or it may develop slowly and become chronic. Come and go as damaged nerves heal, or it may stay at the same level for years. Cause emotional distress, loss of sleep, and a lower quality of life. What are the causes? The most common cause of this condition is diabetes. Many other diseases and conditions can also cause neuropathic pain. Causes of neuropathic pain can be classified as: Toxic. This is caused by medicines and chemicals. The most common causes of toxic neuropathic pain is damage from medicines that kill cancer cells (chemotherapy) or alcohol abuse. Metabolic. This can be caused by: Diabetes. Lack of vitamins like B12. Traumatic. Any injury that cuts, crushes, or stretches a nerve can cause damage and pain. Compression-related. If a sensory nerve gets trapped or compressed for a long period of time, the blood supply to the nerve can be cut off. Vascular. Many blood vessel diseases can cause neuropathic pain by decreasing blood supply and oxygen to nerves. Autoimmune. This type of pain results from diseases in which the body's defense system (immune system) mistakenly attacks sensory nerves. Examples of autoimmune diseases that can cause neuropathic pain include lupus and multiple sclerosis. Infectious. Many types of viral infections can damage sensory nerves and cause pain. Shingles infection is a common cause of this type of pain. Inherited. Neuropathic pain can be a symptom of many diseases that are passed down through families (genetic). What increases the risk? You are more likely to  develop this condition if: You have diabetes. You smoke. You drink too much alcohol. You are taking certain medicines, including chemotherapy or medicines that treat immune system disorders. What are the signs or symptoms? The main symptom is pain. Neuropathic pain is often described as: Burning. Shock-like. Stinging. Hot or cold. Itching. How is this diagnosed? No single test can diagnose neuropathic pain. It is diagnosed based on: A physical exam and your symptoms. Your health care provider will ask you about your pain. You may be asked to use a pain scale to describe how bad your pain is. Tests. These may be done to see if you have a cause and location of any nerve damage. They include: Nerve conduction studies and electromyography to test how well nerve signals travel through your nerves and muscles (electrodiagnostic testing). Skin biopsy to evaluate for small fiber neuropathy. Imaging studies, such as: X-rays. CT scan. MRI. How is this treated? Treatment for neuropathic pain may change over time. You may need to try different treatment options or a combination of treatments. Some options include: Treating the underlying cause of the neuropathy, such as diabetes, kidney disease, or vitamin deficiencies. Stopping medicines that can cause neuropathy, such as chemotherapy. Medicine to relieve pain. Medicines may include: Prescription or over-the-counter pain medicine. Anti-seizure medicine. Antidepressant medicines. Pain-relieving patches or creams that are applied to painful areas of skin. A medicine to numb the area (local anesthetic), which can be injected as a nerve block. Transcutaneous nerve stimulation. This uses electrical currents to block painful nerve signals. The treatment is painless. Alternative treatments, such as: Acupuncture. Meditation. Massage. Occupational or physical therapy. Pain management programs. Counseling. Follow   these instructions at  home: Medicines  Take over-the-counter and prescription medicines only as told by your health care provider. Ask your health care provider if the medicine prescribed to you: Requires you to avoid driving or using machinery. Can cause constipation. You may need to take these actions to prevent or treat constipation: Drink enough fluid to keep your urine pale yellow. Take over-the-counter or prescription medicines. Eat foods that are high in fiber, such as beans, whole grains, and fresh fruits and vegetables. Limit foods that are high in fat and processed sugars, such as fried or sweet foods. Lifestyle  Have a good support system at home. Consider joining a chronic pain support group. Do not use any products that contain nicotine or tobacco. These products include cigarettes, chewing tobacco, and vaping devices, such as e-cigarettes. If you need help quitting, ask your health care provider. Do not drink alcohol. General instructions Learn as much as you can about your condition. Work closely with all your health care providers to find the treatment plan that works best for you. Ask your health care provider what activities are safe for you. Keep all follow-up visits. This is important. Contact a health care provider if: Your pain treatments are not working. You are having side effects from your medicines. You are struggling with tiredness (fatigue), mood changes, depression, or anxiety. Get help right away if: You have thoughts of hurting yourself. Get help right away if you feel like you may hurt yourself or others, or have thoughts about taking your own life. Go to your nearest emergency room or: Call 911. Call the National Suicide Prevention Lifeline at 1-800-273-8255 or 988. This is open 24 hours a day. Text the Crisis Text Line at 741741. Summary Neuropathic pain is pain caused by damage to the nerves that are responsible for certain sensations in your body (sensory  nerves). Neuropathic pain may come and go as damaged nerves heal, or it may stay at the same level for years. Neuropathic pain is usually a long-term condition that can be difficult to treat. Consider joining a chronic pain support group. This information is not intended to replace advice given to you by your health care provider. Make sure you discuss any questions you have with your health care provider. Document Revised: 12/24/2020 Document Reviewed: 12/24/2020 Elsevier Patient Education  2023 [employer]  

## 2022-03-19 ENCOUNTER — Encounter (HOSPITAL_BASED_OUTPATIENT_CLINIC_OR_DEPARTMENT_OTHER): Payer: Medicare Other | Attending: General Surgery | Admitting: General Surgery

## 2022-03-19 DIAGNOSIS — L89322 Pressure ulcer of left buttock, stage 2: Secondary | ICD-10-CM | POA: Diagnosis not present

## 2022-03-19 DIAGNOSIS — L98492 Non-pressure chronic ulcer of skin of other sites with fat layer exposed: Secondary | ICD-10-CM | POA: Diagnosis not present

## 2022-03-19 DIAGNOSIS — I110 Hypertensive heart disease with heart failure: Secondary | ICD-10-CM | POA: Insufficient documentation

## 2022-03-19 DIAGNOSIS — Z6837 Body mass index (BMI) 37.0-37.9, adult: Secondary | ICD-10-CM | POA: Diagnosis not present

## 2022-03-19 DIAGNOSIS — Z932 Ileostomy status: Secondary | ICD-10-CM | POA: Insufficient documentation

## 2022-03-19 DIAGNOSIS — Z87891 Personal history of nicotine dependence: Secondary | ICD-10-CM | POA: Insufficient documentation

## 2022-03-19 DIAGNOSIS — E10622 Type 1 diabetes mellitus with other skin ulcer: Secondary | ICD-10-CM | POA: Diagnosis not present

## 2022-03-19 DIAGNOSIS — G6289 Other specified polyneuropathies: Secondary | ICD-10-CM | POA: Insufficient documentation

## 2022-03-19 DIAGNOSIS — I509 Heart failure, unspecified: Secondary | ICD-10-CM | POA: Insufficient documentation

## 2022-03-19 DIAGNOSIS — Z933 Colostomy status: Secondary | ICD-10-CM | POA: Insufficient documentation

## 2022-03-19 DIAGNOSIS — G8220 Paraplegia, unspecified: Secondary | ICD-10-CM | POA: Insufficient documentation

## 2022-03-24 ENCOUNTER — Telehealth: Payer: Self-pay | Admitting: Internal Medicine

## 2022-03-24 NOTE — Progress Notes (Signed)
WOODWARD, KLEM ([id number]) [id number] Page 1 of 11 Visit Report for 03/19/2022 Allergy List Details Patient Name: Date of Service: [name] 03/19/2022 10:30 A M Medical Record Number: [id number] Patient Account Number: [id number] Date of Birth/Sex: Treating RN: Dec 08, 1950 (71 y.o. [name] Primary Care Adan Baehr: [name] Other Clinician: Referring Myda Detwiler: Treating Saryah Loper/Extender: [name] Allergies Active Allergies penicillin Reaction: rash Severity: Moderate Allergy Notes Electronic Signature(s) Signed: 03/24/2022 5:04:49 PM By: [name] Entered By: Blanche East on 03/19/2022 10:37:33 -------------------------------------------------------------------------------- Arrival Information Details Patient Name: Date of Service: [name]. 03/19/2022 10:30 A M Medical Record Number: [id number] Patient Account Number: [id number] Date of Birth/Sex: Treating RN: 05/24/1950 (71 y.o. [name] Primary Care Romney Compean: [name] Other Clinician: Referring Pressley Tadesse: Treating Aryan Sparks/Extender: [name]: 0 Visit Information Patient Arrived: Stretcher Arrival Time: 10:31 Accompanied By: wife Transfer Assistance: Stretcher Patient Identification Verified: Yes Secondary Verification Process Completed: Yes Patient Requires Transmission-Based Precautions: No History Since Last Visit Added or deleted any medications: No Any new allergies or adverse reactions: No Had a fall or experienced change in activities of daily living that may affect risk of falls: No Signs or symptoms of abuse/neglect since last visito No Hospitalized since last visit: No Implantable device outside of the clinic excluding cellular tissue based products placed in the center since last visit: No Electronic Signature(s) Signed: 03/24/2022 5:04:49 PM By: [name] Entered By: Blanche East on 03/19/2022 10:31:57 [name] ([id number]) [id number] Page 2 of 11 -------------------------------------------------------------------------------- Clinic Level of Care Assessment Details Patient Name: Date of Service: [name] 03/19/2022 10:30 A M Medical Record Number: [id number] Patient Account Number: [id number] Date of Birth/Sex: Treating RN: [birth year] (71 y.o. [name] Primary Care Sarvesh Meddaugh: [name] Other Clinician: Referring Lake Cinquemani: Treating Ernie Kasler/Extender: [name] Clinic Level of Care Assessment Items TOOL 1 Quantity Score X- 1 0 Use when EandM and Procedure is performed on INITIAL visit ASSESSMENTS - Nursing Assessment / Reassessment X- 1 20 General Physical Exam (combine w/ comprehensive assessment (listed just below) when performed on new pt. evals) X- 1 25 Comprehensive Assessment (HX, ROS, Risk Assessments, Wounds Hx, etc.) ASSESSMENTS - Wound and Skin Assessment / Reassessment X- 1 10 Dermatologic / Skin Assessment (not related to wound area) ASSESSMENTS - Ostomy and/or Continence Assessment and Care X- 1 10 Incontinence Assessment and Management X- 1 20 Ostomy Care Assessment and Management (repouching, etc.) PROCESS - Coordination of Care '[]'$  - 0 Simple Patient / Family Education for ongoing care X- 1 20 Complex (extensive) Patient / Family Education for ongoing care X- 1 10 Staff obtains Programmer, systems, Records, T Results / Process Orders est X- 1 10 Staff telephones HHA, Nursing Homes / Clarify orders / etc '[]'$  - 0 Routine Transfer to another Facility (non-emergent condition) '[]'$  - 0 Routine Hospital Admission (non-emergent condition) X- 1 15 New Admissions / Biomedical engineer / Ordering NPWT Apligraf, etc. , '[]'$  - 0 Emergency Hospital Admission (emergent condition) PROCESS - Special Needs '[]'$  - 0 Pediatric /  Minor Patient Management '[]'$  - 0 Isolation Patient Management '[]'$  - 0 Hearing / Language / Visual special needs '[]'$  - 0 Assessment of Community assistance (transportation, D/C planning, etc.) '[]'$  - 0 Additional assistance / Altered mentation X- 1 15 Support Surface(s) Assessment (bed, cushion, seat, etc.) INTERVENTIONS - Miscellaneous '[]'$  - 0 External ear exam X-  1 10 Patient Transfer (multiple staff / Civil Service fast streamer / Similar devices) '[]'$  - 0 Simple Staple / Suture removal (25 or less) '[]'$  - 0 Complex Staple / Suture removal (26 or more) '[]'$  - 0 Hypo/Hyperglycemic Management (do not check if billed separately) '[]'$  - 0 Ankle / Brachial Index (ABI) - do not check if billed separately Has the patient been seen at the hospital within the last three years: Yes Total Score: 165 Level Of Care: New/Established - Level 5 Electronic Signature(s) Signed: 03/24/2022 5:04:49 PM By: [name] Redmond Pulling, Denice Bors ([id number]) [id number] Page 3 of 11 Signed: 03/24/2022 5:04:49 PM By: [name] Entered By: Blanche East on 03/19/2022 11:38:36 -------------------------------------------------------------------------------- Encounter Discharge Information Details Patient Name: Date of Service: [name]. 03/19/2022 10:30 A M Medical Record Number: [id number] Patient Account Number: [id number] Date of Birth/Sex: Treating RN: 21-May-1950 (71 y.o. [name] Primary Care Carder Yin: [name] Other Clinician: Referring Fordyce Lepak: Treating Khaalid Lefkowitz/Extender: [name] Encounter Discharge Information Items Post Procedure Vitals Discharge Condition: Stable Temperature (F): 98.2 Ambulatory Status: Stretcher Pulse (bpm): 73 Discharge Destination: Home Respiratory Rate (breaths/min): 18 Transportation: Ambulance Blood Pressure (mmHg): 161/80 Accompanied By: sister Schedule Follow-up Appointment: No Clinical Summary  of Care: Electronic Signature(s) Signed: 03/24/2022 5:04:49 PM By: [name] Entered By: Blanche East on 03/19/2022 12:27:29 -------------------------------------------------------------------------------- Lower Extremity Assessment Details Patient Name: Date of Service: [name]. 03/19/2022 10:30 A M Medical Record Number: [id number] Patient Account Number: [id number] Date of Birth/Sex: Treating RN: 1951-03-28 (71 y.o. [name] Primary Care Brysyn Brandenberger: [name] Other Clinician: Referring Milca Sytsma: Treating Cristopher Ciccarelli/Extender: [name] Electronic Signature(s) Signed: 03/24/2022 5:04:49 PM By: [name] Entered By: Blanche East on 03/19/2022 10:43:11 -------------------------------------------------------------------------------- Multi Wound Chart Details Patient Name: Date of Service: [name]. 03/19/2022 10:30 A M Medical Record Number: [id number] Patient Account Number: [id number] Date of Birth/Sex: Treating RN: Aug 27, 1950 (71 y.o. M) Primary Care Minard Millirons: [name] Other Clinician: Referring Neddie Steedman: Treating Tiney Zipper/Extender: [name] Vital Signs Height(in): 77 Pulse(bpm): 73 Weight(lbs): 318 Blood Pressure(mmHg): 161/80 JAMAUL, HEIST ([id number]) [id number] Page 4 of 11 Body Mass Index(BMI): 37.7 Temperature(F): 98.2 Respiratory Rate(breaths/min): 18 [3:Photos:] [N/A:N/A] Left Gluteus Abdomen - Lower Quadrant N/A Wound Location: Gradually Appeared Gradually Appeared N/A Wounding Event: Pressure Ulcer Skin T ear N/A Primary Etiology: Congestive Heart Failure, Congestive Heart Failure, N/A Comorbid History: Hypertension, Type I Diabetes, Hypertension, Type I Diabetes, Neuropathy, Paraplegia Neuropathy, Paraplegia 05/14/2021 01/17/2022 N/A Date Acquired: 0 0 N/A Weeks of Treatment: Open Open N/A Wound  Status: No No N/A Wound Recurrence: 12.5x17x0.1 11x2x0.2 N/A Measurements L x W x D (cm) 166.897 17.279 N/A A (cm) : rea 16.69 3.456 N/A Volume (cm) : Category/Stage II Full Thickness Without Exposed N/A Classification: Support Structures Medium N/A N/A Exudate A mount: Serosanguineous N/A N/A Exudate Type: red, brown N/A N/A Exudate Color: Flat and Intact N/A N/A Wound Margin: Small (1-33%) Large (67-100%) N/A Granulation A mount: Red Red N/A Granulation Quality: Large (67-100%) Small (1-33%) N/A Necrotic A mount: Fat Layer (Subcutaneous Tissue): Yes Fat Layer (Subcutaneous Tissue): Yes N/A Exposed Structures: Fascia: No Fascia: No Tendon: No Tendon: No Muscle: No Muscle: No Joint: No Joint: No Bone: No Bone: No None None N/A Epithelialization: Debridement - Selective/Open Wound N/A N/A Debridement: Pre-procedure Verification/Time Out 11:22 N/A N/A Taken: [facility] N/A N/A Tissue Debrided: Non-Viable Tissue N/A  N/A Level: 20 N/A N/A Debridement A (sq cm): rea Curette N/A N/A Instrument: Minimum N/A N/A Bleeding: Pressure N/A N/A Hemostasis A chieved: 0 N/A N/A Procedural Pain: 0 N/A N/A Post Procedural Pain: Procedure was tolerated well N/A N/A Debridement Treatment Response: 12.5x17x0.1 N/A N/A Post Debridement Measurements L x W x D (cm) 16.69 N/A N/A Post Debridement Volume: (cm) Excoriation: Yes Rash: Yes N/A Periwound Skin Texture: Rash: Yes Crepitus: No Maceration: Yes N/A Periwound Skin Moisture: No Abnormality N/A N/A Temperature: Debridement N/A N/A Procedures Performed: Treatment Notes Wound #3 (Gluteus) Wound Laterality: Left Cleanser Soap and Water Discharge Instruction: May shower and wash wound with dial antibacterial soap and water prior to dressing change. Wound Cleanser Discharge Instruction: Cleanse the wound with wound cleanser prior to applying a clean dressing using gauze sponges, not tissue or cotton  balls. Peri-Wound Care Zinc Oxide Ointment 30g tube Discharge Instruction: Apply Zinc Oxide to periwound with each dressing change JANDIEL, MAGALLANES ([id number]) [id number] Page 5 of 11 Topical Primary Dressing KerraCel Ag Gelling Fiber Dressing, 4x5 in (silver alginate) Discharge Instruction: Apply silver alginate to wound bed as instructed Secondary Dressing ABD Pad, 8x10 Discharge Instruction: Apply over primary dressing as directed. Secured With [employer] Surgical T 2x10 (in/yd) ape Discharge Instruction: Secure with tape as directed. Compression Wrap Compression Stockings Add-Ons Wound #4 (Abdomen - Lower Quadrant) Cleanser Peri-Wound Care Topical Primary Dressing Secondary Dressing Secured With Compression Wrap Compression Stockings Add-Ons Electronic Signature(s) Signed: 03/19/2022 12:43:36 PM By: Fredirick Maudlin MD FACS Entered By: Fredirick Maudlin on 03/19/2022 12:43:36 -------------------------------------------------------------------------------- Multi-Disciplinary Care Plan Details Patient Name: Date of Service: [name]. 03/19/2022 10:30 A M Medical Record Number: [id number] Patient Account Number: [id number] Date of Birth/Sex: Treating RN: Apr 21, 1951 (71 y.o. [name] Primary Care Abraham Margulies: [name] Other Clinician: Referring Veronnica Hennings: Treating Darrelle Barrell/Extender: [name] Active Inactive Nutrition Nursing Diagnoses: Potential for alteratiion in Nutrition/Potential for imbalanced nutrition Goals: Patient/caregiver verbalizes understanding of need to maintain therapeutic glucose control per primary care physician Date Initiated: 03/19/2022 Target Resolution Date: 04/16/2022 Goal Status: Active Interventions: Provide education on elevated blood sugars and impact on wound healing MICHEAL, MURAD ([id number]) [id number] Page 6 of  11 Treatment Activities: Dietary management education, guidance and counseling : 03/19/2022 Notes: Orientation to the Wound Care Program Nursing Diagnoses: Knowledge deficit related to the wound healing center program Goals: Patient/caregiver will verbalize understanding of the [place] Program Date Initiated: 03/19/2022 Target Resolution Date: 04/16/2022 Goal Status: Active Interventions: Provide education on orientation to the wound center Notes: Wound/Skin Impairment Nursing Diagnoses: Knowledge deficit related to ulceration/compromised skin integrity Goals: Ulcer/skin breakdown will have a volume reduction of 30% by week 4 Date Initiated: 03/19/2022 Target Resolution Date: 04/16/2022 Goal Status: Active Interventions: Assess ulceration(s) every visit Provide education on ulcer and skin care Treatment Activities: Skin care regimen initiated : 03/19/2022 Notes: Electronic Signature(s) Signed: 03/24/2022 5:04:49 PM By: [name] Entered By: Blanche East on 03/19/2022 10:53:08 -------------------------------------------------------------------------------- Pain Assessment Details Patient Name: Date of Service: [name]. 03/19/2022 10:30 A M Medical Record Number: [id number] Patient Account Number: [id number] Date of Birth/Sex: Treating RN: 1950/09/06 (71 y.o. [name] Primary Care Anara Cowman: [name] Other Clinician: Referring Emon Miggins: Treating Dewight Catino/Extender: [name] Active Problems Location of Pain Severity and Description of Pain Patient Has Paino No Site Locations [place], [place] ([id number]) [id number] Page 7 of 11 Pain Management  and Medication Current Pain Management: Electronic Signature(s) Signed: 03/24/2022 5:04:49 PM By: [name] Entered By: Blanche East on 03/19/2022  10:43:38 -------------------------------------------------------------------------------- Patient/Caregiver Education Details Patient Name: Date of Service: [name] 11/8/2023andnbsp10:30 A M Medical Record Number: [id number] Patient Account Number: [id number] Date of Birth/Gender: Treating RN: 08-01-1950 (71 y.o. [name] Primary Care Physician: [name] Other Clinician: Referring Physician: Treating Physician/Extender: [name]: 0 Education Assessment Education Provided To: Patient Education Topics Provided Elevated Blood Sugar/ Impact on Healing: Methods: Explain/Verbal Responses: Reinforcements needed, State content correctly [place]: o Methods: Explain/Verbal Responses: Reinforcements needed, State content correctly Wound/Skin Impairment: Methods: Explain/Verbal Responses: Reinforcements needed, State content correctly Electronic Signature(s) Signed: 03/24/2022 5:04:49 PM By: [name] Entered By: Blanche East on 03/19/2022 10:54:13 [name] ([id number]) [id number] Page 8 of 11 -------------------------------------------------------------------------------- Wound Assessment Details Patient Name: Date of Service: [name] 03/19/2022 10:30 A M Medical Record Number: [id number] Patient Account Number: [id number] Date of Birth/Sex: Treating RN: 21-Jan-1951 (71 y.o. Mare Ferrari Primary Care Halston Kintz: [name] Other Clinician: Referring Lakecia Deschamps: Treating Aanchal Cope/Extender: [name] Wound Status Wound Number: 3 Primary Pressure Ulcer Etiology: Wound Location: Left Gluteus Wound Open Wounding Event: Gradually Appeared Status: Date Acquired: 05/14/2021 Comorbid Congestive Heart Failure, Hypertension, Type I Diabetes, Weeks Of Treatment: 0 History: Neuropathy, Paraplegia Clustered Wound:  No Photos Wound Measurements Length: (cm) 12.5 Width: (cm) 17 Depth: (cm) 0.1 Area: (cm) 166.897 Volume: (cm) 16.69 % Reduction in Area: % Reduction in Volume: Epithelialization: None Tunneling: No Undermining: No Wound Description Classification: Category/Stage II Wound Margin: Flat and Intact Exudate Amount: Medium Exudate Type: Serosanguineous Exudate Color: red, brown Foul Odor After Cleansing: No Slough/Fibrino Yes Wound Bed Granulation Amount: Small (1-33%) Exposed Structure Granulation Quality: Red Fascia Exposed: No Necrotic Amount: Large (67-100%) Fat Layer (Subcutaneous Tissue) Exposed: Yes Necrotic Quality: Adherent Slough Tendon Exposed: No Muscle Exposed: No Joint Exposed: No Bone Exposed: No Periwound Skin Texture Texture Color No Abnormalities Noted: No No Abnormalities Noted: No Crepitus: No Temperature / Pain Excoriation: Yes Temperature: No Abnormality Rash: Yes Moisture No Abnormalities Noted: No Maceration: Yes Treatment Notes Wound #3 (Gluteus) Wound Laterality: Left OLUWATOMIWA, KINYON ([id number]) [id number] Page 9 of 11 Cleanser Soap and Water Discharge Instruction: May shower and wash wound with dial antibacterial soap and water prior to dressing change. Wound Cleanser Discharge Instruction: Cleanse the wound with wound cleanser prior to applying a clean dressing using gauze sponges, not tissue or cotton balls. Peri-Wound Care Zinc Oxide Ointment 30g tube Discharge Instruction: Apply Zinc Oxide to periwound with each dressing change Topical Primary Dressing KerraCel Ag Gelling Fiber Dressing, 4x5 in (silver alginate) Discharge Instruction: Apply silver alginate to wound bed as instructed Secondary Dressing ABD Pad, 8x10 Discharge Instruction: Apply over primary dressing as directed. Secured With [employer] Surgical T 2x10 (in/yd) ape Discharge Instruction: Secure with tape as  directed. Compression Wrap Compression Stockings Add-Ons Electronic Signature(s) Signed: 03/19/2022 3:10:50 PM By: Sharyn Creamer RN, BSN Signed: 03/24/2022 5:04:49 PM By: [name] Entered By: Blanche East on 03/19/2022 12:08:38 -------------------------------------------------------------------------------- Wound Assessment Details Patient Name: Date of Service: [name]. 03/19/2022 10:30 A M Medical Record Number: [id number] Patient Account Number: [id number] Date of Birth/Sex: Treating RN: Sep 17, 1950 (71 y.o. Mare Ferrari Primary Care Markese Bloxham: [name] Other Clinician: Referring Aeron Donaghey: Treating Miniya Miguez/Extender: Trixie Deis Weeks in  Treatment: 0 Wound Status Wound Number: 4 Primary Skin Tear Etiology: Wound Location: Abdomen - Lower Quadrant Wound Open Wounding Event: Gradually Appeared Status: Date Acquired: 01/17/2022 Comorbid Congestive Heart Failure, Hypertension, Type I Diabetes, Weeks Of Treatment: 0 History: Neuropathy, Paraplegia Clustered Wound: No Photos Wound Measurements SRIHITH, AQUILINO ([id number]) Length: (cm) 11 Width: (cm) 2 Depth: (cm) 0.2 Area: (cm) 17.279 Volume: (cm) 3.456 [id number] Page 10 of 11 % Reduction in Area: % Reduction in Volume: Epithelialization: None Tunneling: No Undermining: No Wound Description Classification: Full Thickness Without Exposed Support Structures Foul Odor After Cleansing: No Slough/Fibrino Yes Wound Bed Granulation Amount: Large (67-100%) Exposed Structure Granulation Quality: Red Fascia Exposed: No Necrotic Amount: Small (1-33%) Fat Layer (Subcutaneous Tissue) Exposed: Yes Necrotic Quality: Adherent Slough Tendon Exposed: No Muscle Exposed: No Joint Exposed: No Bone Exposed: No Periwound Skin Texture Texture Color No Abnormalities Noted: No No Abnormalities Noted: No Rash: Yes Moisture No Abnormalities Noted: No Treatment  Notes Wound #4 (Abdomen - Lower Quadrant) Cleanser Peri-Wound Care Topical Primary Dressing Secondary Dressing Secured With Compression Wrap Compression Stockings Add-Ons Electronic Signature(s) Signed: 03/19/2022 3:10:50 PM By: Sharyn Creamer RN, BSN Entered By: Sharyn Creamer on 03/19/2022 11:03:16 -------------------------------------------------------------------------------- [place] Details Patient Name: Date of Service: [name]. 03/19/2022 10:30 A M Medical Record Number: [id number] Patient Account Number: [id number] Date of Birth/Sex: Treating RN: 1950-07-08 (71 y.o. [name] Primary Care Mckinsley Koelzer: [name] Other Clinician: Referring Caswell Alvillar: Treating Xara Paulding/Extender: [name] Vital Signs Time Taken: 10:36 Temperature (F): 98.2 Height (in): 77 Pulse (bpm): 73 Source: Stated Respiratory Rate (breaths/min): 18 Weight (lbs): 318 Blood Pressure (mmHg): 161/80 SAMIEL, PEEL ([id number]) [id number] Page 11 of 11 Source: Stated Reference Range: 80 - 120 mg / dl Body Mass Index (BMI): 37.7 Electronic Signature(s) Signed: 03/19/2022 3:10:50 PM By: Sharyn Creamer RN, BSN Entered By: Sharyn Creamer on 03/19/2022 10:49:45

## 2022-03-24 NOTE — Progress Notes (Signed)
TYRESS, LODEN ([id number]) [id number] Page 1 of 4 Visit Report for 03/19/2022 Abuse Risk Screen Details Patient Name: Date of Service: [name] 03/19/2022 10:30 A M Medical Record Number: [id number] Patient Account Number: [id number] Date of Birth/Sex: Treating RN: June 12, 1950 (72 y.o. [name] Primary Care [name] Other Clinician: Referring [name]: Treating [name]: [name] Abuse Risk Screen Items Answer ABUSE RISK SCREEN: Has anyone close to you tried to hurt or harm you recentlyo No Do you feel uncomfortable with anyone in your familyo No Has anyone forced you do things that you didnt want to doo No Electronic Signature(s) Signed: 03/24/2022 5:04:49 PM By: [name] Entered By: Blanche East on 03/19/2022 10:40:20 -------------------------------------------------------------------------------- Activities of Daily Living Details Patient Name: Date of Service: [name] 03/19/2022 10:30 A M Medical Record Number: [id number] Patient Account Number: [id number] Date of Birth/Sex: Treating RN: 01-08-1951 (71 y.o. [name] Primary Care [name]: [name] Other Clinician: Referring [name]: Treating [name]: [name] Activities of Daily Living Items Answer Activities of Daily Living (Please select one for each item) Drive Automobile Not Able T Medications ake Not Able Use T elephone Need Assistance Care for Appearance Not Able Use T oilet Not Able Bath / Shower Need Assistance Dress Self Need Assistance Feed Self Completely Able Walk Need Assistance Get In / Out Bed Need Assistance Housework Need Assistance Prepare Meals Not Able Handle Money Not Able Shop for Self Not Able Electronic Signature(s) Signed: 03/24/2022 5:04:49 PM By: [name] Entered By: Blanche East  on 03/19/2022 10:40:54 [name] ([id number]) [phone] [id number] Page 2 of 4 -------------------------------------------------------------------------------- Education Screening Details Patient Name: Date of Service: [name] 03/19/2022 10:30 A M Medical Record Number: [id number] Patient Account Number: [id number] Date of Birth/Sex: Treating RN: 10/29/1950 (71 y.o. [name] Primary Care Leith Szafranski: [name] Other Clinician: Referring Wynonna Fitzhenry: Treating [name]: [name]: 0 Learning Preferences/Education Level/Primary Language Learning Preference: Explanation Highest Education Level: High School Preferred Language: English Cognitive Barrier Language Barrier: No Translator Needed: No Memory Deficit: No Emotional Barrier: No Cultural/Religious Beliefs Affecting Medical Care: No Physical Barrier Impaired Hearing: Yes Knowledge/Comprehension Knowledge Level: Medium Comprehension Level: Medium Ability to understand written instructions: Medium Ability to understand verbal instructions: Medium Motivation Anxiety Level: Calm Cooperation: Cooperative Education Importance: Acknowledges Need Interest in Health Problems: Asks Questions Perception: Coherent Willingness to Engage in Self-Management High Activities: Readiness to Engage in Self-Management High Activities: Electronic Signature(s) Signed: 03/24/2022 5:04:49 PM By: [name] Entered By: Blanche East on 03/19/2022 10:41:20 -------------------------------------------------------------------------------- Fall Risk Assessment Details Patient Name: Date of Service: [name]. 03/19/2022 10:30 A M Medical Record Number: [id number] Patient Account Number: [id number] Date of Birth/Sex: Treating RN: [birth year] (71 y.o. [name] Primary Care [name] Other Clinician: Referring  [name]: Treating [name]: [name] Fall Risk Assessment Items Have you had 2 or more falls in the last 12 monthso 0 No Have you had any fall that resulted in injury in the last 12 monthso 0 No FALLS RISK SCREEN History of falling - immediate or within 3 months 0 No MAJOUR, FREI ([id number]) [phone] [id number] Page 3 of 4 Secondary diagnosis (Do you have 2 or more medical diagnoseso) 0 No Ambulatory aid None/bed rest/wheelchair/nurse 0 No Crutches/cane/walker 0 No Furniture 0 No  Intravenous therapy Access/Saline/Heparin Lock 0 No Gait/Transferring Normal/ bed rest/ wheelchair 0 No Weak (short steps with or without shuffle, stooped but able to lift head while walking, may seek 0 No support from furniture) Impaired (short steps with shuffle, may have difficulty arising from chair, head down, impaired 20 Yes balance) Mental Status Oriented to own ability 0 No Electronic Signature(s) Signed: 03/24/2022 5:04:49 PM By: [name] Entered By: Blanche East on 03/19/2022 10:42:10 -------------------------------------------------------------------------------- Foot Assessment Details Patient Name: Date of Service: [name]. 03/19/2022 10:30 A M Medical Record Number: [id number] Patient Account Number: [id number] Date of Birth/Sex: Treating RN: 1950/09/06 (71 y.o. [name] Primary Care [name]: [name] Other Clinician: Referring [name]: Treating [name] Foot Assessment Items Site Locations + = Sensation present, - = Sensation absent, C = Callus, U = Ulcer R = Redness, W = Warmth, M = Maceration, PU = Pre-ulcerative lesion F = Fissure, S = Swelling, D = Dryness Assessment Right: Left: Other Deformity: No No Prior Foot Ulcer: No No Prior Amputation: No No Charcot Joint: No No Ambulatory Status:  Non-ambulatory Assistance Device: Stretcher GaitERYCK, [name] ([id number]) [id number] Page 4 of 4 Electronic Signature(s) Signed: 03/24/2022 5:04:49 PM By: [name] Entered By: Blanche East on 03/19/2022 10:43:03 -------------------------------------------------------------------------------- Nutrition Risk Screening Details Patient Name: Date of Service: [name] 03/19/2022 10:30 A M Medical Record Number: [id number] Patient Account Number: [id number] Date of Birth/Sex: Treating RN: October 30, 1950 (71 y.o. [name] Primary Care Ashlan Dignan: [name] Other Clinician: Referring Rushton Early: Treating Rodderick Holtzer/Extender: [name] Height (in): 77 Weight (lbs): 318 Body Mass Index (BMI): 37.7 Nutrition Risk Screening Items Score Screening NUTRITION RISK SCREEN: I have an illness or condition that made me change the kind and/or amount of food I eat 0 No I eat fewer than two meals per day 0 No I eat few fruits and vegetables, or milk products 0 No I have three or more drinks of beer, liquor or wine almost every day 0 No I have tooth or mouth problems that make it hard for me to eat 0 No I don't always have enough money to buy the food I need 0 No I eat alone most of the time 0 No I take three or more different prescribed or over-the-counter drugs a day 1 Yes Without wanting to, I have lost or gained 10 pounds in the last six months 0 No I am not always physically able to shop, cook and/or feed myself 2 Yes Nutrition Protocols Good Risk Protocol Provide education on elevated blood Moderate Risk Protocol 0 sugars and impact on wound healing, as applicable High Risk Proctocol Risk Level: Moderate Risk Score: 3 Electronic Signature(s) Signed: 03/24/2022 5:04:49 PM By: [name] Entered By: Blanche East on 03/19/2022 10:42:47

## 2022-03-24 NOTE — Progress Notes (Signed)
NAPHTALI, ZYWICKI ([id number]) [id number] Page 1 of 10 Visit Report for 03/19/2022 Chief Complaint Document Details Patient Name: Date of Service: [name] 03/19/2022 10:30 A M Medical Record Number: [id number] Patient Account Number: [id number] Date of Birth/Sex: Treating RN: 01/26/1951 (71 y.o. M) Primary Care Provider: [name] Other Clinician: Referring Provider: Treating Provider/Extender: [name] Information Obtained from: Patient Chief Complaint Here for follow up abdominal wound present following laparotomy, colostomy. 03/19/2022: Here for pressure ulcer on left gluteus and ulcer adjacent to ileostomy Electronic Signature(s) Signed: 03/19/2022 12:44:19 PM By: Fredirick Maudlin MD FACS Entered By: Fredirick Maudlin on 03/19/2022 12:44:18 -------------------------------------------------------------------------------- Debridement Details Patient Name: Date of Service: [name]. 03/19/2022 10:30 A M Medical Record Number: [id number] Patient Account Number: [id number] Date of Birth/Sex: Treating RN: 29-Jun-1950 (71 y.o. [name] Primary Care Provider: [name] Other Clinician: Referring Provider: Treating Provider/Extender: [name] Debridement Performed for Assessment: Wound #3 Left Gluteus Performed By: Physician Fredirick Maudlin, MD Debridement Type: Debridement Level of Consciousness (Pre-procedure): Awake and Alert Pre-procedure Verification/Time Out Yes - 11:22 Taken: Start Time: 11:23 T Area Debrided (L x W): otal 4 (cm) x 5 (cm) = 20 (cm) Tissue and other material debrided: Non-Viable, [place] Level: Non-Viable Tissue Debridement Description: Selective/Open Wound Instrument: Curette Bleeding: Minimum Hemostasis Achieved: Pressure Procedural Pain: 0 Post Procedural Pain: 0 Response to Treatment: Procedure was  tolerated well Level of Consciousness (Post- Awake and Alert procedure): Post Debridement Measurements of Total Wound Length: (cm) 12.5 Width: (cm) 17 Depth: (cm) 0.1 Volume: (cm) 16.69 Character of Wound/Ulcer Post Debridement: Requires Further Debridement Post Procedure Diagnosis NAZIM, KADLEC ([id number]) [id number] Page 2 of 10 Same as Pre-procedure Notes Scribed for Dr. Celine Ahr by Blanche East, RN Electronic Signature(s) Signed: 03/19/2022 12:55:38 PM By: Fredirick Maudlin MD FACS Signed: 03/24/2022 5:04:49 PM By: [name] Entered By: Blanche East on 03/19/2022 11:26:20 -------------------------------------------------------------------------------- HPI Details Patient Name: Date of Service: [name]. 03/19/2022 10:30 A M Medical Record Number: [id number] Patient Account Number: [id number] Date of Birth/Sex: Treating RN: 1950/05/30 (71 y.o. M) Primary Care Provider: [name] Other Clinician: Referring Provider: Treating Provider/Extender: [name] History of Present Illness HPI Description: On medihoney with border dressing due to stalling on collagen and concern ABD was pulling off skin. 09/11/14 Nearly healed, counseled given the thin attenuated scar skin I expect will have some recurrence wounds with slight trauma or maceration, however much improved today. cont ABD pads and medihoney. 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 He resides with his sister and is on a regular hospital bed. The 2 of them reported that for about the last year, he has  had ulceration adjacent to his ileostomy as well as on his left gluteus and upper posterior thigh. Most recent hemoglobin A1c was 6.3%. On his left gluteus, there is a stage II pressure ulcer with surrounding tissue maceration and excoriation. There is a little slough on the wound surface. Adjacent to his ileostomy, associated with his appliance, there is an ulcer that extends into the fat layer. It is clean without any slough or necrotic tissue.  Electronic Signature(s) Signed: 03/19/2022 12:48:05 PM By: Fredirick Maudlin MD FACS Entered By: Fredirick Maudlin on 03/19/2022 12:48:05 -------------------------------------------------------------------------------- Physical Exam Details Patient Name: Date of Service: [name]. 03/19/2022 10:30 A M Medical Record Number: [id number] Patient Account Number: [id number] Date of Birth/Sex: Treating RN: Oct 03, 1950 (71 y.o. M) Primary Care Provider: [name] Other Clinician: Referring Provider: Treating Provider/Extender: [name] Constitutional Hypertensive, asymptomatic. . . . No acute distress. Respiratory Normal work of breathing on room air.. Notes 03/19/2022: On his left gluteus, there is a stage II pressure ulcer with surrounding tissue maceration and excoriation. There is a little slough on the wound surface. Adjacent to his ileostomy, associated with his appliance, there is an ulcer that extends into the fat layer. It is clean without any slough or necrotic ZOHAN, SHIFLET ([id number]) [id number] Page 3 of 10 tissue. Electronic Signature(s) Signed: 03/19/2022 12:48:47 PM By: Fredirick Maudlin MD FACS Entered By: Fredirick Maudlin on 03/19/2022 12:48:47 -------------------------------------------------------------------------------- Physician Orders Details Patient Name: Date of Service: [name]. 03/19/2022 10:30 A M Medical Record Number:  [id number] Patient Account Number: [id number] Date of Birth/Sex: Treating RN: Jun 15, 1950 (71 y.o. 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 - Dr. Celine Ahr Rm 4 ***Needs extra time, STRETCHER**** Off-Loading Wound #3 Left Gluteus Turn and reposition every 2 hours Home Health Wound #3 Left Gluteus dmit to [facility] for wound care. May utilize formulary equivalent dressing for wound treatment orders unless otherwise specified. - A Skilled nursing for wound care management New wound care orders this week; continue Home Health for wound care. May utilize formulary equivalent dressing for wound treatment orders unless otherwise specified. - Skilled nursing for wound care management Apply Zinc to peri-wound and AgAlg to open wound bed, covered with ABD pad secured with Medipore tape. Other Home Health Orders/Instructions: - [facility] Wound Treatment Wound #3 - Gluteus Wound Laterality: Left Cleanser: Soap and Water 1 x Per Day/30 Days Discharge Instructions: May shower and wash wound with dial antibacterial soap and water prior to dressing change. Cleanser: Wound Cleanser 1 x Per Day/30 Days Discharge Instructions: Cleanse the wound with wound cleanser prior to applying a clean dressing using gauze sponges, not tissue or cotton balls. Peri-Wound Care: Zinc Oxide Ointment 30g tube 1 x Per Day/30 Days Discharge Instructions: Apply Zinc  Oxide to periwound with each dressing change Prim Dressing: KerraCel Ag Gelling Fiber Dressing, 4x5 in (silver alginate) 1 x Per Day/30 Days ary Discharge Instructions: Apply silver alginate to wound bed as instructed Secondary Dressing: ABD Pad, 8x10 1 x Per Day/30 Days Discharge Instructions: Apply over primary dressing as directed. Secured With: 4M Medipore Public affairs consultant Surgical T 2x10 (in/yd) 1 x Per Day/30 Days ape Discharge Instructions: Secure with tape as directed. Wound #4 - Abdomen [address] ALOYSIOUS, VANGIESON ([id number]) [id number] Page 4 of 10 Add-Ons: [place] for Ostomy Care- Assess and treat non-healing wound near stoma. Electronic Signature(s) Signed: 03/19/2022 12:55:38 PM By: Fredirick Maudlin  MD FACS Entered By: Fredirick Maudlin on 03/19/2022 12:50:10 Prescription 03/19/2022 -------------------------------------------------------------------------------- [name]. Fredirick Maudlin MD Patient Name: Provider: [birth year] [id number] Date of Birth: NPI#: Jerilynn Mages [id number] Sex: DEA #: [id number] Phone #: License #: [place] Patient Address: [place] [place], [place] [address] [place],  [address] [phone] Allergies penicillin Provider's Orders [place] for Ostomy Care- Assess and treat non-healing wound near stoma. Hand Signature: Date(s): Electronic Signature(s) Signed: 03/19/2022 12:53:36 PM By: Fredirick Maudlin MD FACS Entered By: Fredirick Maudlin on 03/19/2022 12:53:36 -------------------------------------------------------------------------------- Problem List Details Patient Name: Date of Service: [name]. 03/19/2022 10:30 A M Medical Record Number: [id number] Patient Account Number: [id number] Date of Birth/Sex: Treating RN: 1951-04-11 (71 y.o. [name] Primary Care Provider: [name] Other Clinician: Referring Provider: Treating Provider/Extender: [name] Active Problems ICD-10 Encounter Code Description Active Date MDM Diagnosis [phone] Pressure ulcer of left buttock, stage 2 03/19/2022 No Yes MARISSA, LOWREY ([id number]) [id number] Page 5 of 10 [phone] Non-pressure chronic ulcer of skin of other sites with fat layer exposed 03/19/2022 No Yes E66.01 Morbid (severe) obesity due to excess calories 03/19/2022 No Yes G62.89 Other specified polyneuropathies 03/19/2022 No Yes Z93.2 Ileostomy status 03/19/2022 No Yes E11.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 03/19/2022 10:30 A M Medical Record Number: [id number] Patient Account Number: [id number] Date of Birth/Sex: Treating RN: 1951-02-08 (71 y.o. M) Primary Care Provider: [name] Other Clinician: Referring Provider: Treating Provider/Extender: [name] Subjective Chief Complaint Information obtained from Patient Here for follow up abdominal wound present following laparotomy, colostomy. 03/19/2022: Here for pressure ulcer on left gluteus and ulcer adjacent to ileostomy History of Present Illness (HPI) On medihoney with border dressing due to stalling on collagen and concern ABD was pulling off skin. 09/11/14 Nearly healed, counseled given the thin attenuated scar skin I expect will have some recurrence wounds with slight trauma or maceration, however much improved today. cont ABD pads and medihoney. 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 He resides with his sister and is on a regular hospital bed. The 2 of them reported that for about the last year, he has had ulceration adjacent to his ileostomy as well as on his left gluteus and upper posterior thigh. Most recent hemoglobin A1c was 6.3%. On his left gluteus, there is a stage II pressure ulcer with surrounding tissue maceration and excoriation. There is a little slough on the wound surface. Adjacent to his ileostomy, associated with his appliance, there is an ulcer that extends into the fat layer. It is clean without any slough or necrotic tissue. Patient History Information obtained from Patient. Allergies penicillin (Severity: Moderate, Reaction: rash) Family History Cancer - Father,Mother, Diabetes - Father,Mother,Siblings, Heart Disease - Mother,Siblings, Hypertension - Father,Siblings, Kidney Disease - Siblings. CURLY, MACKOWSKI ([id number]) [id number] Page 6 of 10 Social  History Former smoker - ended on 08/10/2013, Marital Status - Single, Alcohol Use - Rarely, Drug Use - No History, Caffeine Use - Rarely. Medical History Constitutional Symptoms (General Health) Patient has history of Congestive Heart Failure Eyes Denies history of Cataracts, Glaucoma, Optic Neuritis Ear/Nose/Mouth/Throat Denies history of Chronic sinus problems/congestion, Middle ear problems Cardiovascular Patient has history of Hypertension Endocrine Patient has history of Type I Diabetes Denies history of Type 1 Diabetes Genitourinary Denies history of End Stage Renal  Disease Neurologic Patient has history of Neuropathy, Paraplegia Psychiatric Denies history of Anorexia/bulimia, Confinement Anxiety Patient is treated with Oral Agents. Medical A Surgical History Notes nd Cardiovascular atrial fibrillation Musculoskeletal arthritis Review of Systems (ROS) Eyes Complains or has symptoms of Vision Changes - readers. Denies complaints or symptoms of Dry Eyes. Ear/Nose/Mouth/Throat Denies complaints or symptoms of Chronic sinus problems or rhinitis. Genitourinary Denies complaints or symptoms of Frequent urination. Immunological Psoriasis Musculoskeletal Complains or has symptoms of Muscle Pain, Muscle Weakness. Psychiatric Complains or has symptoms of Claustrophobia. Objective Constitutional Hypertensive, asymptomatic. No acute distress. Vitals Time Taken: 10:36 AM, Height: 77 in, Source: Stated, Weight: 318 lbs, Source: Stated, BMI: 37.7, Temperature: 98.2 F, Pulse: 73 bpm, Respiratory Rate: 18 breaths/min, Blood Pressure: 161/80 mmHg. Respiratory Normal work of breathing on room air.. General Notes: 03/19/2022: On his left gluteus, there is a stage II pressure ulcer with surrounding tissue maceration and excoriation. There is a little slough on the wound surface. Adjacent to his ileostomy, associated with his appliance, there is an ulcer that extends into the fat layer. It is clean without any slough or necrotic tissue. Integumentary (Hair, Skin) Wound #3 status is Open. Original cause of wound was Gradually Appeared. The date acquired was: 05/14/2021. The wound is located on the Left Gluteus. The wound measures 12.5cm length x 17cm width x 0.1cm depth; 166.897cm^2 area and 16.69cm^3 volume. There is Fat Layer (Subcutaneous Tissue) exposed. There is no tunneling or undermining noted. There is a medium amount of serosanguineous drainage noted. The wound margin is flat and intact. There is small (1-33%) red granulation within the wound bed. There  is a large (67-100%) amount of necrotic tissue within the wound bed including Adherent Slough. The periwound skin appearance exhibited: Excoriation, Rash, Maceration. The periwound skin appearance did not exhibit: Crepitus. Periwound temperature was noted as No Abnormality. Wound #4 status is Open. Original cause of wound was Gradually Appeared. The date acquired was: 01/17/2022. The wound is located on the Abdomen - Lower Quadrant. The wound measures 11cm length x 2cm width x 0.2cm depth; 17.279cm^2 area and 3.456cm^3 volume. There is Fat Layer (Subcutaneous Tissue) exposed. There is no tunneling or undermining noted. There is large (67-100%) red granulation within the wound bed. There is a small (1-33%) amount of necrotic tissue within the wound bed including Adherent Slough. The periwound skin appearance exhibited: Rash. Assessment [address] 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 There was a Selective/Open Wound Non-Viable Tissue Debridement o with a total area of 20 sq cm performed by Fredirick Maudlin, MD. With the following instrument(s): Curette to remove Non-Viable tissue/material. Material removed includes [facility]. A time out was conducted  at 11:22, prior to the start of the procedure. A Minimum amount of bleeding was controlled with Pressure. The procedure was tolerated well with a pain level of 0 throughout and a pain level of 0 following the procedure. Post Debridement Measurements: 12.5cm length x 17cm width x 0.1cm depth; 16.69cm^3 volume. Character of Wound/Ulcer Post  Debridement requires further debridement. Post procedure Diagnosis Wound #3: Same as Pre-Procedure General Notes: Scribed for Dr. Celine Ahr by Blanche East, RN. Plan Follow-up Appointments: Return appointment in 3 weeks. - Dr. Celine Ahr Rm 4 ***Needs extra time, STRETCHER**** Off-Loading: Wound #3 Left Gluteus: Turn and reposition every 2 hours Home Health: Wound #3 Left Gluteus: Admit to Home Health for wound care. May utilize formulary equivalent dressing for wound treatment orders unless otherwise specified. - Skilled nursing for wound care management New wound care orders this week; continue Home Health for wound care. May utilize formulary equivalent dressing for wound treatment orders unless otherwise specified. - Skilled nursing for wound care management Apply Zinc to peri-wound and AgAlg to open wound bed, covered with ABD pad secured with Medipore tape. Other Home Health Orders/Instructions: - [facility] ordered were: [place] for Ostomy Care- Assess and treat non-healing wound near stoma. WOUND #3: - Gluteus Wound Laterality: Left Cleanser: Soap and Water 1 x Per Day/30 Days Discharge Instructions: May shower and wash wound with dial antibacterial soap and water prior to dressing change. Cleanser: Wound Cleanser 1 x Per Day/30 Days Discharge Instructions: Cleanse the wound with wound cleanser prior to applying a clean dressing using gauze sponges, not tissue or cotton balls. Peri-Wound Care: Zinc Oxide Ointment 30g tube 1 x Per Day/30 Days Discharge Instructions: Apply Zinc Oxide to periwound with each dressing change Prim Dressing: KerraCel Ag Gelling Fiber Dressing, 4x5 in (silver alginate) 1 x Per Day/30 Days ary Discharge Instructions: Apply silver alginate to wound bed as instructed Secondary Dressing: ABD Pad, 8x10 1 x Per Day/30 Days Discharge Instructions: Apply over primary dressing as directed. Secured With: 72M Medipore Public affairs consultant Surgical T 2x10  (in/yd) 1 x Per Day/30 Days ape Discharge Instructions: Secure with tape as directed. WOUND #4: - Abdomen - Lower Quadrant Wound Laterality: Add-Ons: 03/19/2022: This is a 71 year old morbidly obese functional paraplegic who has 2 separate wounded areas. On his left gluteus, there is a stage II pressure ulcer with surrounding tissue maceration and excoriation. There is a little slough on the wound surface. Adjacent to his ileostomy, associated with his appliance, there is an ulcer that extends into the fat layer. It is clean without any slough or necrotic tissue. I used a curette to debride slough from the gluteal pressure ulcer. We will apply zinc oxide to the periwound skin and silver alginate to the open ulcerated areas. We are going to order him a low-air-loss mattress and we discussed the importance of offloading the area with the patient and his sister. As for the ulcer adjacent to his ileostomy, unfortunately, I do not see a good solution for this. If we exclude the ulcer from the stoma, I foresee extensive leakage; if we extended the appliance to go beyond the area of the ulcer, the ulcer will become contaminated with stool. The stoma was created in 2015 and given the patient's significant comorbidities, I cannot think that any surgeon would consider re-siting the ostomy. We are going to refer him to the outpatient center for ostomy services to see if they have some options that may be beneficial for him. Due to transportation  difficulties, they will follow-up here in 3 weeks for his buttock ulcer. Electronic Signature(s) Signed: 03/19/2022 12:52:40 PM By: Fredirick Maudlin MD FACS Entered By: Fredirick Maudlin on 03/19/2022 12:52:40 [name] ([id number]) [id number] Page 8 of 10 -------------------------------------------------------------------------------- HxROS Details Patient Name: Date of Service: [name] 03/19/2022 10:30 A M Medical Record  Number: [id number] Patient Account Number: [id number] Date of Birth/Sex: Treating RN: 1951-01-20 (71 y.o. [name] Primary Care Provider: [name] Other Clinician: Referring Provider: Treating Provider/Extender: [name] Information Obtained From Patient Eyes Complaints and Symptoms: Positive for: Vision Changes - readers Negative for: Dry Eyes Medical History: Negative for: Cataracts; Glaucoma; Optic Neuritis Ear/Nose/Mouth/Throat Complaints and Symptoms: Negative for: Chronic sinus problems or rhinitis Medical History: Negative for: Chronic sinus problems/congestion; Middle ear problems Genitourinary Complaints and Symptoms: Negative for: Frequent urination Medical History: Negative for: End Stage Renal Disease Musculoskeletal Complaints and Symptoms: Positive for: Muscle Pain; Muscle Weakness Medical History: Past Medical History Notes: arthritis Psychiatric Complaints and Symptoms: Positive for: Claustrophobia Medical History: Negative for: Anorexia/bulimia; Confinement Anxiety Constitutional Symptoms (General Health) Medical History: Positive for: Congestive Heart Failure Cardiovascular Medical History: Positive for: Hypertension Past Medical History Notes: atrial fibrillation Endocrine Medical History: Positive for: Type I Diabetes Negative for: Type 1 Diabetes Time with diabetes: less then 10 Treated with: Oral agents LEV, CERVONE ([id number]) [id number] Page 9 of 10 Immunological Complaints and Symptoms: Review of System Notes: Psoriasis Neurologic Medical History: Positive for: Neuropathy; Paraplegia Immunizations Pneumococcal Vaccine: Received Pneumococcal Vaccination: Yes Received Pneumococcal Vaccination On or After 60th Birthday: Yes Implantable Devices None Family and Social History Cancer: Yes - Father,Mother; Diabetes: Yes - Father,Mother,Siblings;  Heart Disease: Yes - Mother,Siblings; Hypertension: Yes - Father,Siblings; Kidney Disease: Yes - Siblings; Former smoker - ended on 08/10/2013; Marital Status - Single; Alcohol Use: Rarely; Drug Use: No History; Caffeine Use: Rarely; Financial Concerns: No; Food, Clothing or Shelter Needs: No; Support System Lacking: No; Transportation Concerns: No Physician Affirmation I have reviewed and agree with the above information. Electronic Signature(s) Signed: 03/19/2022 12:55:38 PM By: Fredirick Maudlin MD FACS Signed: 03/24/2022 5:04:49 PM By: [name] Entered By: Blanche East on 03/19/2022 10:40:02 -------------------------------------------------------------------------------- SuperBill Details Patient Name: Date of Service: [name] 03/19/2022 Medical Record Number: [id number] Patient Account Number: [id number] Date of Birth/Sex: Treating RN: 02-27-1951 (71 y.o. 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 E66.01 Morbid (severe) obesity due to excess calories G62.89 Other specified polyneuropathies Z93.2 Ileostomy status E11.622 Type 2 diabetes mellitus with other skin ulcer Facility Procedures : CPT4 Code: [id number] Description: 69485 - WOUND CARE VISIT-LEV 5 EST PT Modifier: 25 Quantity: 1 : CPT4 Code: [id number] Description: 09381 - DEBRIDE WOUND 1ST 20 SQ CM OR < ICD-10 Diagnosis Description 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, stage 2 Modifier: Quantity: 1 Physician Procedures : CPT4 Code Description Modifier JAMIERE, GULAS ([id number]) [id number] - WC PHYS LEVEL 4 - NEW PT 25 1 ICD-10 Diagnosis Description L89.322 Pressure ulcer of left  buttock, stage 2 L98.492 Non-pressure chronic  ulcer of skin of other sites with fat layer exposed E66.01 Morbid (severe) obesity due to excess calories 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

## 2022-03-24 NOTE — Telephone Encounter (Signed)
Patient sister called and wanted to know if patient could get a virtual visit with Dr. Debara Pickett since he is bedridden.

## 2022-03-25 NOTE — Telephone Encounter (Signed)
Message sent to schedulers -- OK for video visit

## 2022-04-10 ENCOUNTER — Encounter (HOSPITAL_BASED_OUTPATIENT_CLINIC_OR_DEPARTMENT_OTHER): Payer: Medicare Other | Admitting: General Surgery

## 2022-04-10 DIAGNOSIS — E10622 Type 1 diabetes mellitus with other skin ulcer: Secondary | ICD-10-CM | POA: Diagnosis not present

## 2022-04-11 NOTE — Progress Notes (Addendum)
SHERIDAN, GETTEL ([id number]) [id number] Page 1 of 9 Visit Report for 04/10/2022 Chief Complaint Document Details Patient Name: Date of Service: [name] 04/10/2022 11:00 A M Medical Record Number: [id number] Patient Account Number: [id number] Date of Birth/Sex: Treating RN: [birth year] (71 y.o. M) Primary Care Provider: [name] Other Clinician: Referring Provider: Treating Provider/Extender: [name]: 3 Information Obtained from: Patient Chief Complaint Here for follow up abdominal wound present following laparotomy, colostomy. 03/19/2022: Here for pressure ulcer on left gluteus and ulcer adjacent to ileostomy Electronic Signature(s) Signed: 04/10/2022 12:36:14 PM By: Fredirick Maudlin MD FACS Entered By: Fredirick Maudlin on 04/10/2022 12:36:14 -------------------------------------------------------------------------------- Debridement Details Patient Name: Date of Service: [name]. 04/10/2022 11:00 A M Medical Record Number: [id number] Patient Account Number: [id number] Date of Birth/Sex: Treating RN: 05/17/1950 (71 y.o. M) Primary Care Provider: [name] Other Clinician: Referring Provider: Treating Provider/Extender: [name]: 3 Debridement Performed for Assessment: Wound #3 Left Gluteus Performed By: Physician Fredirick Maudlin, MD Debridement Type: Debridement Level of Consciousness (Pre-procedure): Awake and Alert Pre-procedure Verification/Time Out Yes - 11:35 Taken: Start Time: 11:35 Pain Control: Lidocaine 4% T opical Solution T Area Debrided (L x W): otal 11 (cm) x 13 (cm) = 143 (cm) Tissue and other material debrided: Non-Viable, Eschar, Slough, Slough Level: Non-Viable Tissue Debridement Description: Selective/Open Wound Instrument: Curette Bleeding: Minimum Hemostasis Achieved: Pressure Response to Treatment: Procedure was  tolerated well Level of Consciousness (Post- Awake and Alert procedure): Post Debridement Measurements of Total Wound Length: (cm) 21 Stage: Category/Stage II Width: (cm) 23 Depth: (cm) 0.1 Volume: (cm) 37.935 Character of Wound/Ulcer Post Debridement: Requires Further Debridement Post Procedure Diagnosis Same as [name], [name] ([id number]) [id number] Page 2 of 9 Notes Scribed for Dr. Celine Ahr by Blanche East, RN Electronic Signature(s) Signed: 04/11/2022 10:40:04 AM By: Fredirick Maudlin MD FACS Previous Signature: 04/10/2022 3:45:18 PM Version By: Fredirick Maudlin MD FACS Previous Signature: 04/10/2022 5:33:24 PM Version By: Dellie Catholic RN Previous Signature: 04/10/2022 12:45:53 PM Version By: Fredirick Maudlin MD FACS Entered By: Fredirick Maudlin on 04/11/2022 10:40:04 -------------------------------------------------------------------------------- HPI Details Patient Name: Date of Service: [name]. 04/10/2022 11:00 A M Medical Record Number: [id number] Patient Account Number: [id number] Date of Birth/Sex: Treating RN: Feb 22, 1951 (71 y.o. M) Primary Care Provider: [name] Other Clinician: Referring Provider: Treating Provider/Extender: [name]: 3 History of Present Illness HPI Description: On medihoney with border dressing due to stalling on collagen and concern ABD was pulling off skin. 09/11/14 Nearly healed, counseled given the thin attenuated scar skin I expect will have some recurrence wounds with slight trauma or maceration, however much improved today. cont ABD pads and medihoney. 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 He resides with his sister and is on a regular hospital bed. The 2 of them reported that for about the last year, he has had ulceration adjacent to his ileostomy as well as on his left gluteus and upper posterior thigh. Most recent hemoglobin A1c was 6.3%. On his left gluteus, there is a stage II pressure ulcer with surrounding tissue maceration and excoriation. There is a little slough on the wound surface. Adjacent  to his ileostomy, associated with his appliance, there is an ulcer that extends into the fat layer. It is clean without any slough or necrotic tissue. 04/10/2022: The left gluteal ulcer has expanded and is quite a bit larger today. There is slough and eschar accumulation. The patient's sister reports that the ileostomy associated wound got better and so they did not go to see the outpatient ostomy nurse. They have not received the low-air-loss mattress. Electronic Signature(s) Signed: 04/10/2022 12:40:28 PM By: Fredirick Maudlin MD FACS Entered By: Fredirick Maudlin on 04/10/2022 12:40:28 -------------------------------------------------------------------------------- Physical Exam Details Patient Name: Date of Service: [name]. 04/10/2022 11:00 A M Medical Record Number: [id number] Patient Account Number: [id number] Date of Birth/Sex: Treating RN: 1951/01/08 (71 y.o. M) Primary Care Provider: [name] Other Clinician: Referring Provider: Treating Provider/Extender: [name]: 3 Constitutional He is hypertensive, but asymptomatic.. . . . No acute distress. Respiratory Normal work of breathing on room air. [name], [name] ([id number]) [id number] Page 3 of 9 Notes 04/10/2022: The left gluteal ulcer has expanded and is quite a bit larger today. There is slough and eschar accumulation. Electronic  Signature(s) Signed: 04/10/2022 12:42:49 PM By: Fredirick Maudlin MD FACS Entered By: Fredirick Maudlin on 04/10/2022 12:42:48 -------------------------------------------------------------------------------- Physician Orders Details Patient Name: Date of Service: [name]. 04/10/2022 11:00 A M Medical Record Number: [id number] Patient Account Number: [id number] Date of Birth/Sex: Treating RN: 1950/09/04 (71 y.o. 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 - Dr. Celine Ahr Rm 4 ***Needs extra time, STRETCHER**** Off-Loading Wound #3 Left Gluteus Turn and reposition every 2 hours Home Health Wound #3 Left Gluteus Other Home Health Orders/Instructions: - [place] Wound Treatment Wound #3 - Gluteus Wound Laterality: Left Cleanser: Soap and Water 1 x Per Day/30 Days Discharge Instructions: May shower and wash wound with dial antibacterial soap and water prior to dressing change. Cleanser: Wound Cleanser (DME) (Generic) 1 x Per Day/30 Days Discharge Instructions: Cleanse the wound with wound cleanser prior to applying a clean dressing using gauze sponges, not tissue or cotton balls. Peri-Wound Care: Zinc Oxide Ointment 30g tube (Generic) 1 x Per Day/30 Days Discharge Instructions: Apply Zinc Oxide to periwound with each dressing change Prim Dressing: KerraCel Ag Gelling Fiber Dressing, 4x5 in (silver  alginate) (DME) (Generic) 1 x Per Day/30 Days ary Discharge Instructions: Apply silver alginate to wound bed as instructed Secondary Dressing: ABD Pad, 8x10 (DME) (Generic) 1 x Per Day/30 Days Discharge Instructions: Apply over primary dressing as directed. Secured With: 37M Medipore Public affairs consultant Surgical T 2x10 (in/yd) (DME) (Generic) 1 x Per Day/30 Days ape Discharge Instructions: Secure with tape as directed. Wound #4 - Abdomen - Lower Quadrant Prim Dressing: Sorbalgon AG Dressing 2x2 (in/in) (DME) (Generic) 1 x Per Day/30 Days ary Discharge Instructions: Apply to wound bed as instructed Secondary Dressing: T Non-adherent Dressing, 2x3 in (DME) (Generic) 1 x Per Day/30 Days elfa DYSHAWN, [name] ([id number]) [id number] Page 4 of 9 Discharge Instructions: Apply over primary dressing as directed. Secondary  Dressing: Zetuvit Plus 4x8 in (DME) (Generic) 1 x Per Day/30 Days Discharge Instructions: Apply over primary dressing as directed. Add-Ons: 1 x Per Day/30 Days Electronic Signature(s) Signed: 04/15/2022 4:45:09 PM By: Dellie Catholic RN Signed: 04/16/2022 7:29:47 AM By: Fredirick Maudlin MD FACS Previous Signature: 04/10/2022 5:33:24 PM Version By: Dellie Catholic RN Previous Signature: 04/11/2022 7:41:18 AM Version By: Fredirick Maudlin MD FACS Previous Signature: 04/10/2022 12:45:53 PM Version By: Fredirick Maudlin MD FACS Entered By: Dellie Catholic on 04/15/2022 16:11:46 -------------------------------------------------------------------------------- Problem List Details Patient Name: Date of Service: [name] 04/10/2022 11:00 A M Medical Record Number: [id number] Patient Account Number: [id number] Date of Birth/Sex: Treating RN: 1950-09-13 (71 y.o. 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 M) Primary Care Provider: [name] Other Clinician: Referring Provider: Treating Provider/Extender: [name]: 3 Subjective Chief Complaint Information obtained from Patient Here for follow up abdominal wound present following laparotomy, colostomy. 03/19/2022: Here for pressure ulcer on left gluteus and ulcer adjacent to ileostomy History of Present Illness (HPI) On medihoney with border dressing due to stalling on collagen and concern ABD was pulling off skin. 09/11/14 Nearly healed, counseled given the thin attenuated scar skin I expect will have some recurrence wounds with slight trauma or maceration, however much improved today. cont ABD pads and medihoney. 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 He resides with his sister and is on a regular hospital bed. The 2 of them reported that for about the last year, he has had ulceration adjacent to his ileostomy as well as on his left gluteus and upper posterior thigh. Most recent hemoglobin A1c was 6.3%. On his left gluteus, there is a stage II pressure ulcer with surrounding tissue maceration and excoriation. There is a little slough on the wound surface. Adjacent to his ileostomy, associated with his appliance, there is an ulcer that extends into the fat layer. It is clean without any slough or necrotic tissue. 04/10/2022: The left gluteal ulcer has expanded and is quite a bit larger today. There is slough and eschar accumulation. The patient's sister reports that the ileostomy associated wound got better and so they did not go to see the outpatient ostomy nurse. They have not received the low-air-loss mattress. Patient History Information obtained from Patient. Family History Cancer - Father,Mother, Diabetes - Father,Mother,Siblings, Heart Disease -  Mother,Siblings, Hypertension - Father,Siblings, Kidney Disease - Siblings. Social History Former smoker - ended on 08/10/2013, Marital Status - Single, Alcohol Use - Rarely, Drug Use - No History, Caffeine Use - Rarely. Medical History Constitutional Symptoms (General Health) Patient has history of Congestive Heart Failure Eyes Denies history of Cataracts, Glaucoma, Optic Neuritis Ear/Nose/Mouth/Throat Denies history of Chronic sinus problems/congestion, Middle ear problems Cardiovascular Patient has history of Hypertension Endocrine Patient has history of Type I Diabetes Denies history of Type 1 Diabetes Genitourinary Denies history of End Stage Renal Disease Neurologic Patient has history of Neuropathy,  Paraplegia Psychiatric Denies history of Anorexia/bulimia, Confinement Anxiety Medical A Surgical History Notes nd Cardiovascular atrial fibrillation Musculoskeletal arthritis Objective [name], [name] ([id number]) [id number] Page 6 of 9 Constitutional He is hypertensive, but asymptomatic.Marland Kitchen No acute distress. Vitals Time Taken: 11:12 AM, Height: 77 in, Weight: 318 lbs, BMI: 37.7, Temperature: 97.5 F, Pulse: 101 bpm, Respiratory Rate: 18 breaths/min, Blood Pressure: 158/75 mmHg. Respiratory Normal work of breathing on room air. General Notes: 04/10/2022: The left gluteal ulcer has expanded and is quite a bit larger today. There is slough and eschar accumulation. Integumentary (Hair, Skin) Wound #3 status is Open. Original cause of wound was Gradually Appeared. The date acquired was: 05/14/2021. The wound has been in treatment 3 weeks. The wound is located on the Left Gluteus. The wound measures 21cm length x 23cm width x 0.4cm depth; 379.347cm^2 area and 151.739cm^3 volume. There is Fat Layer (Subcutaneous Tissue) exposed. There is no tunneling or undermining noted. There is a medium amount of serosanguineous drainage noted. The wound margin is flat and intact. There is medium (34-66%) red, friable, hyper - granulation within the wound bed. There is a medium (34-66%) amount of necrotic tissue within the wound bed including Adherent Slough. The periwound skin appearance had no abnormalities noted for texture. The periwound skin appearance had no abnormalities noted for moisture. The periwound skin appearance had no abnormalities noted for color. Periwound temperature was noted as No Abnormality. Wound #4 status is Open. Original cause of wound was Gradually Appeared. The date acquired was: 01/17/2022. The wound has been in treatment 3 weeks. The wound is located on the Abdomen - Lower Quadrant. The wound measures 1.1cm length x 2cm width x 0.2cm depth; 1.728cm^2 area  and 0.346cm^3 volume. There is Fat Layer (Subcutaneous Tissue) exposed. There is no tunneling or undermining noted. There is a medium amount of serosanguineous drainage noted. The wound margin is indistinct and nonvisible. There is large (67-100%) red granulation within the wound bed. There is a small (1-33%) amount of necrotic tissue within the wound bed including Adherent Slough. The periwound skin appearance had no abnormalities noted for texture. The periwound skin appearance had no abnormalities noted for moisture. The periwound skin appearance had no abnormalities noted for color. Periwound temperature was noted as No Abnormality. 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 There was a Selective/Open Wound Non-Viable Tissue Debridement with a total area of 143 sq cm performed by Fredirick Maudlin, MD. With the following instrument(s): Curette to remove Non-Viable tissue/material. Material removed includes Eschar and Slough and after achieving pain control using Lidocaine 4% T opical Solution. No specimens were taken. A  time out was conducted at 11:35, prior to the start of the procedure. A Minimum amount of bleeding was controlled with Pressure. The procedure was tolerated well. Post Debridement Measurements: 21cm length x 23cm width x 0.1cm depth; 37.935cm^3 volume. Post debridement Stage noted as Category/Stage II. Character of Wound/Ulcer Post Debridement requires further debridement. Post procedure Diagnosis Wound #3: Same as Pre-Procedure General Notes: Scribed for Dr. Celine Ahr by Blanche East, RN. Plan Follow-up Appointments: Return  appointment in 3 weeks. - Dr. Celine Ahr Rm 4 ***Needs extra time, STRETCHER**** Off-Loading: Wound #3 Left Gluteus: Turn and reposition every 2 hours Home Health: Wound #3 Left Gluteus: New wound care orders this week; continue Home Health for wound care. May utilize formulary equivalent dressing for wound treatment orders unless otherwise specified. - Skilled nursing for wound care management Apply Zinc to peri-wound and AgAlg to open wound bed, covered with ABD pad secured with Medipore tape. Other Home Health Orders/Instructions: - [facility] WOUND #3: - Gluteus Wound Laterality: Left Cleanser: Soap and Water 1 x Per Day/30 Days Discharge Instructions: May shower and wash wound with dial antibacterial soap and water prior to dressing change. Cleanser: Wound Cleanser (DME) (Generic) 1 x Per Day/30 Days Discharge Instructions: Cleanse the wound with wound cleanser prior to applying a clean dressing using gauze sponges, not tissue or cotton balls. Peri-Wound Care: Zinc Oxide Ointment 30g tube (Generic) 1 x Per Day/30 Days Discharge Instructions: Apply Zinc Oxide to periwound with each dressing change Prim Dressing: KerraCel Ag Gelling Fiber Dressing, 4x5 in (silver alginate) (DME) (Generic) 1 x Per Day/30 Days ary Discharge Instructions: Apply silver alginate to wound bed as instructed Secondary Dressing: ABD Pad, 8x10 (DME) (Generic) 1 x Per Day/30 Days [name], [name] ([id number]) [id number] Page 7 of 9 Discharge Instructions: Apply over primary dressing as directed. Secured With: 30M Medipore Public affairs consultant Surgical T 2x10 (in/yd) (DME) (Generic) 1 x Per Day/30 Days ape Discharge Instructions: Secure with tape as directed. WOUND #4: - Abdomen - Lower Quadrant Wound Laterality: Prim Dressing: Sorbalgon AG Dressing 2x2 (in/in) (DME) (Generic) 1 x Per Day/30 Days ary Discharge Instructions: Apply to wound bed as instructed Secondary Dressing: T Non-adherent Dressing,  2x3 in (DME) (Generic) 1 x Per Day/30 Days elfa Discharge Instructions: Apply over primary dressing as directed. Secondary Dressing: Zetuvit Plus 4x8 in (DME) (Generic) 1 x Per Day/30 Days Discharge Instructions: Apply over primary dressing as directed. Add-Ons: 1 x Per Day/30 Days 04/10/2022: The left gluteal ulcer has expanded and is quite a bit larger today. There is slough and eschar accumulation. I used a curette to debride slough and eschar from the wound. I am not sure why they have not yet received the low-air-loss mattress, but we will communicate with medical modalities regarding this. We will continue silver alginate with periwound zinc oxide and we will add an additional absorbent layer.. I think much of the breakdown is secondary to combination of moisture and pressure. The wound near his ileostomy had improved to the point that neither the patient nor his sister felt they needed to see the ostomy nurse so we left this site alone. Follow-up in 3 weeks, as per their preference. Electronic Signature(s) Signed: 04/11/2022 10:41:02 AM By: Fredirick Maudlin MD FACS Previous Signature: 04/10/2022 5:33:24 PM Version By: Dellie Catholic RN Previous Signature: 04/11/2022 7:41:18 AM Version By: Fredirick Maudlin MD FACS Previous Signature: 04/10/2022 3:45:18 PM Version By: Fredirick Maudlin MD FACS Previous Signature: 04/10/2022 12:44:43 PM Version By: Fredirick Maudlin MD FACS Entered By: Fredirick Maudlin  on 04/11/2022 10:41:02 -------------------------------------------------------------------------------- HxROS Details Patient Name: Date of Service: [name] 04/10/2022 11:00 A M Medical Record Number: [id number] Patient Account Number: [id number] Date of Birth/Sex: Treating RN: Mar 11, 1951 (71 y.o. M) Primary Care Provider: [name] Other Clinician: Referring Provider: Treating Provider/Extender: [name]: 3 Information Obtained  From Patient Constitutional Symptoms (General Health) Medical History: Positive for: Congestive Heart Failure Eyes Medical History: Negative for: Cataracts; Glaucoma; Optic Neuritis Ear/Nose/Mouth/Throat Medical History: Negative for: Chronic sinus problems/congestion; Middle ear problems Cardiovascular Medical History: Positive for: Hypertension Past Medical History Notes: atrial fibrillation Endocrine Medical History: Positive for: Type I Diabetes Negative for: Type 1 Diabetes Time with diabetes: less then 10 Treated with: Oral agents [name], [name] ([id number]) [id number] Page 8 of 9 Genitourinary Medical History: Negative for: End Stage Renal Disease Musculoskeletal Medical History: Past Medical History Notes: arthritis Neurologic Medical History: Positive for: Neuropathy; Paraplegia Psychiatric Medical History: Negative for: Anorexia/bulimia; Confinement Anxiety Immunizations Pneumococcal Vaccine: Received Pneumococcal Vaccination: Yes Received Pneumococcal Vaccination On or After 60th Birthday: Yes Implantable Devices None Family and Social History Cancer: Yes - Father,Mother; Diabetes: Yes - Father,Mother,Siblings; Heart Disease: Yes - Mother,Siblings; Hypertension: Yes - Father,Siblings; Kidney Disease: Yes - Siblings; Former smoker - ended on 08/10/2013; Marital Status - Single; Alcohol Use: Rarely; Drug Use: No History; Caffeine Use: Rarely; Financial Concerns: No; Food, Clothing or Shelter Needs: No; Support System Lacking: No; Transportation Concerns: No Electronic Signature(s) Signed: 04/10/2022 12:45:53 PM By: Fredirick Maudlin MD FACS Entered By: Fredirick Maudlin on 04/10/2022 12:41:14 -------------------------------------------------------------------------------- SuperBill Details Patient Name: Date of Service: [name] 04/10/2022 Medical Record Number: [id number] Patient Account Number: [id number] Date of  Birth/Sex: Treating RN: November 23, 1950 (71 y.o. 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 diabetes mellitus with other skin ulcer Facility Procedures : [name], [name] Code: [id number] Pricilla Riffle ([id number]) Description: [phone] - DEBRIDE WOUND 1ST 20 SQ CM OR < ICD-10 Diagnosis Description [id number] 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, stage 2 Modifier: [id number] Quantity: 1 27.pdf Page 9 of 9 : CPT4 Code: [id number] IC Description: 096 - DEBRIDE WOUND EA ADDL 20 SQ CM D-10 Diagnosis Description 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, stage 2 Modifier: 11 Quantity: Physician Procedures : CPT4 Code Description Modifier [id number] - WC PHYS LEVEL 3 - EST PT 25 ICD-10 Diagnosis Description 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, 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 E66.01 Morbid  (severe) obesity due to excess calories E11.622 Type 2 diabetes mellitus with other skin ulcer Quantity: 1 : [id number] - WC PHYS DEBR WO ANESTH 20 SQ CM ICD-10 Diagnosis Description 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

## 2022-04-11 NOTE — Progress Notes (Signed)
[name], [name] ([id number]) [id number] Page 1 of 9 Visit Report for 04/10/2022 Arrival Information Details Patient Name: Date of Service: [name] 04/10/2022 11:00 A M Medical Record Number: [id number] Patient Account Number: [id number] Date of Birth/Sex: Treating RN: 09-Oct-1950 (71 y.o. Collene Gobble Primary Care Kashana Breach: [name] Other Clinician: Referring Renlee Floor: Treating Emarie Paul/Extender: [name]: 3 Visit Information History Since Last Visit Added or deleted any medications: No Patient Arrived: Stretcher Any new allergies or adverse reactions: No Arrival Time: 11:11 Had a fall or experienced change in No Accompanied By: sister activities of daily living that may affect Transfer Assistance: Manual risk of falls: Patient Identification Verified: Yes Signs or symptoms of abuse/neglect since last visito No Patient Requires Transmission-Based Precautions: No Hospitalized since last visit: No Implantable device outside of the clinic excluding No cellular tissue based products placed in the center since last visit: Has Dressing in Place as Prescribed: Yes Pain Present Now: No Electronic Signature(s) Signed: 04/10/2022 5:33:24 PM By: Dellie Catholic RN Entered By: Dellie Catholic on 04/10/2022 11:12:06 -------------------------------------------------------------------------------- Encounter Discharge Information Details Patient Name: Date of Service: [name]. 04/10/2022 11:00 A M Medical Record Number: [id number] Patient Account Number: [id number] Date of Birth/Sex: Treating RN: July 15, 1950 (71 y.o. [name] Primary Care Marlayna Bannister: [name] Other Clinician: Referring Emmerson Taddei: Treating Juleah Paradise/Extender: [name]: 3 Encounter Discharge Information Items Post Procedure Vitals Discharge Condition: Stable Temperature (F):  97.5 Ambulatory Status: Stretcher Pulse (bpm): 101 Discharge Destination: Home Respiratory Rate (breaths/min): 18 Transportation: Ambulance Blood Pressure (mmHg): 158/75 Accompanied By: self Schedule Follow-up Appointment: Yes Clinical Summary of Care: Electronic Signature(s) Signed: 04/10/2022 7:35:29 PM By: [name] Entered By: Blanche East on 04/10/2022 11:58:27 [name] ([id number]) [id number] Page 2 of 9 -------------------------------------------------------------------------------- Lower Extremity Assessment Details Patient Name: Date of Service: [name] 04/10/2022 11:00 A M Medical Record Number: [id number] Patient Account Number: [id number] Date of Birth/Sex: Treating RN: 09-19-1950 (71 y.o. Collene Gobble Primary Care Jaleen Grupp: [name] Other Clinician: Referring Sherwood Castilla: Treating Karmyn Lowman/Extender: [name]: 3 Electronic Signature(s) Signed: 04/10/2022 5:33:24 PM By: Dellie Catholic RN Entered By: Dellie Catholic on 04/10/2022 11:13:33 -------------------------------------------------------------------------------- Multi Wound Chart Details Patient Name: Date of Service: [name]. 04/10/2022 11:00 A M Medical Record Number: [id number] Patient Account Number: [id number] Date of Birth/Sex: Treating RN: 10/21/1950 (71 y.o. M) Primary Care Nadean Montanaro: [name] Other Clinician: Referring Jackolyn Geron: Treating Giulio Bertino/Extender: [name]: 3 Vital Signs Height(in): 77 Pulse(bpm): 101 Weight(lbs): 318 Blood Pressure(mmHg): 158/75 Body Mass Index(BMI): 37.7 Temperature(F): 97.5 Respiratory Rate(breaths/min): 18 [3:Photos:] [N/A:N/A] Left Gluteus Abdomen - Lower Quadrant N/A Wound Location: Gradually Appeared Gradually Appeared N/A Wounding Event: Pressure Ulcer Skin T ear N/A Primary Etiology: Congestive Heart  Failure, Congestive Heart Failure, N/A Comorbid History: Hypertension, Type I Diabetes, Hypertension, Type I Diabetes, Neuropathy, Paraplegia Neuropathy, Paraplegia 05/14/2021 01/17/2022 N/A Date Acquired: 3 3 N/A Weeks of Treatment: Open Open N/A Wound Status: No No N/A Wound Recurrence: 21x23x0.4 1.1x2x0.2 N/A Measurements L x W x D (cm) 379.347 1.728 N/A A (cm) : rea 151.739 0.346 N/A Volume (cm) : -127.30% 90.00% N/A % Reduction in Area: -809.20% 90.00% N/A % Reduction in Volume: Category/Stage II Full Thickness Without Exposed N/A Classification: Support Structures Medium Medium N/A Exudate Amount: Serosanguineous Serosanguineous N/A Exudate Type: red, brown red, brown N/A Exudate Color: Flat and Intact Indistinct,  nonvisible N/A Wound Margin: Medium (34-66%) Large (67-100%) N/A Granulation Amount: [name], [name] ([id number]) [id number] Page 3 of 9 Red, Hyper-granulation, Friable Red N/A Granulation Quality: Medium (34-66%) Small (1-33%) N/A Necrotic Amount: Fat Layer (Subcutaneous Tissue): Yes Fat Layer (Subcutaneous Tissue): Yes N/A Exposed Structures: Fascia: No Fascia: No Tendon: No Tendon: No Muscle: No Muscle: No Joint: No Joint: No Bone: No Bone: No None None N/A Epithelialization: Debridement - Selective/Open Wound N/A N/A Debridement: Pre-procedure Verification/Time Out 11:35 N/A N/A Taken: Necrotic/Eschar, Slough N/A N/A Tissue Debrided: Non-Viable Tissue N/A N/A Level: 483 N/A N/A Debridement A (sq cm): rea Curette N/A N/A Instrument: Minimum N/A N/A Bleeding: Pressure N/A N/A Hemostasis A chieved: Procedure was tolerated well N/A N/A Debridement Treatment Response: 21x23x0.1 N/A N/A Post Debridement Measurements L x W x D (cm) 37.935 N/A N/A Post Debridement Volume: (cm) Category/Stage II N/A N/A Post Debridement Stage: Excoriation: Yes Rash: Yes N/A Periwound Skin Texture: Rash:  Yes Crepitus: No Maceration: Yes No Abnormalities Noted N/A Periwound Skin Moisture: Erythema: No No Abnormalities Noted N/A Periwound Skin Color: No Abnormality No Abnormality N/A Temperature: Debridement N/A N/A Procedures Performed: Treatment Notes Wound #3 (Gluteus) Wound Laterality: Left Cleanser Soap and Water Discharge Instruction: May shower and wash wound with dial antibacterial soap and water prior to dressing change. Wound Cleanser Discharge Instruction: Cleanse the wound with wound cleanser prior to applying a clean dressing using gauze sponges, not tissue or cotton balls. Peri-Wound Care Zinc Oxide Ointment 30g tube Discharge Instruction: Apply Zinc Oxide to periwound with each dressing change Topical Primary Dressing KerraCel Ag Gelling Fiber Dressing, 4x5 in (silver alginate) Discharge Instruction: Apply silver alginate to wound bed as instructed Secondary Dressing ABD Pad, 8x10 Discharge Instruction: Apply over primary dressing as directed. Secured With [employer] Surgical T 2x10 (in/yd) ape Discharge Instruction: Secure with tape as directed. Compression Wrap Compression Stockings Add-Ons Wound #4 (Abdomen - Lower Quadrant) Cleanser Peri-Wound Care Topical Primary Dressing Sorbalgon AG Dressing 2x2 (in/in) Discharge Instruction: Apply to wound bed as instructed Secondary Dressing T Non-adherent Dressing, 2x3 in elfa Discharge Instruction: Apply over primary dressing as directed. [name], [name] ([id number]) [id number] Page 4 of 9 Zetuvit Plus 4x8 in Discharge Instruction: Apply over primary dressing as directed. Secured With [employer] Surgical T 2x10 (in/yd) ape Discharge Instruction: Secure with tape as directed. Compression Wrap Compression Stockings Add-Ons Electronic Signature(s) Signed: 04/10/2022 12:34:58 PM By: Fredirick Maudlin MD FACS Entered By: Fredirick Maudlin on 04/10/2022  12:34:58 -------------------------------------------------------------------------------- Multi-Disciplinary Care Plan Details Patient Name: Date of Service: [name]. 04/10/2022 11:00 A M Medical Record Number: [id number] Patient Account Number: [id number] Date of Birth/Sex: Treating RN: 07-Aug-1950 (71 y.o. [name] Primary Care Andrews Tener: [name] Other Clinician: Referring Tyra Michelle: Treating Alexie Samson/Extender: [name]: 3 Active Inactive Nutrition Nursing Diagnoses: Potential for alteratiion in Nutrition/Potential for imbalanced nutrition Goals: Patient/caregiver verbalizes understanding of need to maintain therapeutic glucose control per primary care physician Date Initiated: 03/19/2022 Target Resolution Date: 06/10/2022 Goal Status: Active Interventions: Provide education on elevated blood sugars and impact on wound healing Treatment Activities: Dietary management education, guidance and counseling : 03/19/2022 Notes: Orientation to the Wound Care Program Nursing Diagnoses: Knowledge deficit related to the wound healing center program Goals: Patient/caregiver will verbalize understanding of the [facility] Date Initiated: 03/19/2022 Target Resolution Date: 06/10/2022 Goal Status: Active Interventions: Provide education on orientation to the wound center Notes: Wound/Skin Impairment Nursing Diagnoses: Knowledge deficit  related to ulceration/compromised skin integrity LOC, [name] ([id number]) [id number] Page 5 of 9 Goals: Ulcer/skin breakdown will have a volume reduction of 30% by week 4 Date Initiated: 03/19/2022 Target Resolution Date: 04/16/2022 Goal Status: Active Interventions: Assess ulceration(s) every visit Provide education on ulcer and skin care Treatment Activities: Skin care regimen initiated : 03/19/2022 Notes: Electronic Signature(s) Signed:  04/10/2022 7:35:29 PM By: [name] Entered By: Blanche East on 04/10/2022 11:54:19 -------------------------------------------------------------------------------- Pain Assessment Details Patient Name: Date of Service: [name]. 04/10/2022 11:00 A M Medical Record Number: [id number] Patient Account Number: [id number] Date of Birth/Sex: Treating RN: Jan 23, 1951 (71 y.o. Collene Gobble Primary Care Gary Bultman: [name] Other Clinician: Referring Innocence Schlotzhauer: Treating Hayleen Clinkscales/Extender: [name]: 3 Active Problems Location of Pain Severity and Description of Pain Patient Has Paino Yes Site Locations Pain Location: Generalized Pain With Dressing Change: No Duration of the Pain. Constant / Intermittento Constant Rate the pain. Current Pain Level: 10 Worst Pain Level: 10 Least Pain Level: 5 Tolerable Pain Level: 10 Character of Pain Describe the Pain: Burning Pain Management and Medication Current Pain Management: Medication: Yes Cold Application: No Rest: Yes Massage: No Activity: No T.E.N.S.: No Heat Application: No Leg drop or elevation: No Is the Current Pain Management Adequate: Inadequate How does your wound impact your activities of daily livingo Sleep: No Bathing: No Appetite: No Relationship With Others: No Bladder Continence: No Emotions: No Bowel Continence: No Work: No [name], [name] ([id number]) [id number] Page 6 of 9 Toileting: No Drive: No Dressing: No Hobbies: No Electronic Signature(s) Signed: 04/10/2022 5:33:24 PM By: Dellie Catholic RN Entered By: Dellie Catholic on 04/10/2022 11:13:23 -------------------------------------------------------------------------------- Patient/Caregiver Education Details Patient Name: Date of Service: [name] 11/30/2023andnbsp11:00 A M Medical Record Number: [id number] Patient Account Number: [id number] Date of  Birth/Gender: Treating RN: 1950/08/07 (71 y.o. [name] Primary Care Physician: [name] Other Clinician: Referring Physician: Treating Physician/Extender: [name]: 3 Education Assessment Education Provided To: Patient Education Topics Provided Elevated Blood Sugar/ Impact on Healing: Welcome T [facility] [employer]: o Methods: Explain/Verbal Responses: Reinforcements needed, State content correctly Wound/Skin Impairment: Methods: Explain/Verbal Responses: Reinforcements needed, State content correctly Electronic Signature(s) Signed: 04/10/2022 7:35:29 PM By: [name] Entered By: Blanche East on 04/10/2022 11:54:39 -------------------------------------------------------------------------------- Wound Assessment Details Patient Name: Date of Service: [name]. 04/10/2022 11:00 A M Medical Record Number: [id number] Patient Account Number: [id number] Date of Birth/Sex: Treating RN: 1951-02-03 (71 y.o. Collene Gobble Primary Care Zennie Ayars: [name] Other Clinician: Referring Tiah Heckel: Treating Osceola Depaz/Extender: [name]: 3 Wound Status Wound Number: 3 Primary Pressure Ulcer Etiology: Wound Location: Left Gluteus Wound Open Wounding Event: Gradually Appeared Status: Date Acquired: 05/14/2021 Comorbid Congestive Heart Failure, Hypertension, Type I Diabetes, Weeks Of Treatment: 3 History: Neuropathy, Paraplegia Clustered Wound: No Photos [name], [name] ([id number]) [id number] Page 7 of 9 Wound Measurements Length: (cm) 21 Width: (cm) 23 Depth: (cm) 0.4 Area: (cm) 379.347 Volume: (cm) 151.739 % Reduction in Area: -127.3% % Reduction in Volume: -809.2% Epithelialization: None Tunneling: No Undermining: No Wound Description Classification: Category/Stage III Wound Margin: Flat and Intact Exudate Amount: Medium Exudate  Type: Serosanguineous Exudate Color: red, brown Foul Odor After Cleansing: No Slough/Fibrino Yes Wound Bed Granulation Amount: Medium (34-66%) Exposed Structure Granulation Quality: Red, Hyper-granulation, Friable Fascia Exposed: No Necrotic Amount: Medium (34-66%) Fat Layer (Subcutaneous Tissue) Exposed: Yes Necrotic Quality: Adherent Slough  Tendon Exposed: No Muscle Exposed: No Joint Exposed: No Bone Exposed: No Periwound Skin Texture Texture Color No Abnormalities Noted: Yes No Abnormalities Noted: Yes Moisture Temperature / Pain No Abnormalities Noted: Yes Temperature: No Abnormality Treatment Notes Wound #3 (Gluteus) Wound Laterality: Left Cleanser Soap and Water Discharge Instruction: May shower and wash wound with dial antibacterial soap and water prior to dressing change. Wound Cleanser Discharge Instruction: Cleanse the wound with wound cleanser prior to applying a clean dressing using gauze sponges, not tissue or cotton balls. Peri-Wound Care Zinc Oxide Ointment 30g tube Discharge Instruction: Apply Zinc Oxide to periwound with each dressing change Topical Primary Dressing KerraCel Ag Gelling Fiber Dressing, 4x5 in (silver alginate) Discharge Instruction: Apply silver alginate to wound bed as instructed Secondary Dressing ABD Pad, 8x10 Discharge Instruction: Apply over primary dressing as directed. Secured With [employer] Surgical T 2x10 (in/yd) ape Discharge Instruction: Secure with tape as directed. Compression Wrap Compression Stockings [name], [name] ([id number]) [id number] Page 8 of 9 Add-Ons Electronic Signature(s) Signed: 04/14/2022 5:02:57 PM By: [name] Signed: 04/15/2022 4:45:09 PM By: Dellie Catholic RN Previous Signature: 04/10/2022 5:33:24 PM Version By: Dellie Catholic RN Entered By: Blanche East on 04/14/2022  10:08:07 -------------------------------------------------------------------------------- Wound Assessment Details Patient Name: Date of Service: [name]. 04/10/2022 11:00 A M Medical Record Number: [id number] Patient Account Number: [id number] Date of Birth/Sex: Treating RN: [birth year] (71 y.o. Collene Gobble Primary Care Kamry Faraci: [name] Other Clinician: Referring Kember Boch: Treating Arien Benincasa/Extender: [name]: 3 Wound Status Wound Number: 4 Primary Skin Tear Etiology: Wound Location: Abdomen - Lower Quadrant Wound Open Wounding Event: Gradually Appeared Status: Date Acquired: 01/17/2022 Comorbid Congestive Heart Failure, Hypertension, Type I Diabetes, Weeks Of Treatment: 3 History: Neuropathy, Paraplegia Clustered Wound: No Photos Wound Measurements Length: (cm) 1.1 Width: (cm) 2 Depth: (cm) 0.2 Area: (cm) 1.728 Volume: (cm) 0.346 % Reduction in Area: 90% % Reduction in Volume: 90% Epithelialization: None Tunneling: No Undermining: No Wound Description Classification: Full Thickness Without Exposed Suppor Wound Margin: Indistinct, nonvisible Exudate Amount: Medium Exudate Type: Serosanguineous Exudate Color: red, brown t Structures Foul Odor After Cleansing: No Slough/Fibrino Yes Wound Bed Granulation Amount: Large (67-100%) Exposed Structure Granulation Quality: Red Fascia Exposed: No Necrotic Amount: Small (1-33%) Fat Layer (Subcutaneous Tissue) Exposed: Yes Necrotic Quality: Adherent Slough Tendon Exposed: No Muscle Exposed: No Joint Exposed: No Bone Exposed: No Periwound Skin Texture Texture Color No Abnormalities Noted: Yes No Abnormalities Noted: Yes Moisture Temperature / Pain [name], [name] ([id number]) [id number] Page 9 of 9 No Abnormalities Noted: Yes Temperature: No Abnormality Treatment Notes Wound #4 (Abdomen - Lower Quadrant) Cleanser Peri-Wound  Care Topical Primary Dressing Sorbalgon AG Dressing 2x2 (in/in) Discharge Instruction: Apply to wound bed as instructed Secondary Dressing T Non-adherent Dressing, 2x3 in elfa Discharge Instruction: Apply over primary dressing as directed. Zetuvit Plus 4x8 in Discharge Instruction: Apply over primary dressing as directed. Secured With [employer] Surgical T 2x10 (in/yd) ape Discharge Instruction: Secure with tape as directed. Compression Wrap Compression Stockings Add-Ons Electronic Signature(s) Signed: 04/10/2022 5:33:24 PM By: Dellie Catholic RN Entered By: Dellie Catholic on 04/10/2022 11:57:23 -------------------------------------------------------------------------------- Vitals Details Patient Name: Date of Service: [name]. 04/10/2022 11:00 A M Medical Record Number: [id number] Patient Account Number: [id number] Date of Birth/Sex: Treating RN: 01/20/1951 (71 y.o. Collene Gobble Primary Care Bertin Inabinet: [name] Other Clinician: Referring Rindi Beechy: Treating Jisselle Poth/Extender: [name]: 3 Vital Signs  Time Taken: 11:12 Temperature (F): 97.5 Height (in): 77 Pulse (bpm): 101 Weight (lbs): 318 Respiratory Rate (breaths/min): 18 Body Mass Index (BMI): 37.7 Blood Pressure (mmHg): 158/75 Reference Range: 80 - 120 mg / dl Electronic Signature(s) Signed: 04/10/2022 5:33:24 PM By: Dellie Catholic RN Entered By: Dellie Catholic on 04/10/2022 11:12:43

## 2022-05-01 ENCOUNTER — Encounter (HOSPITAL_BASED_OUTPATIENT_CLINIC_OR_DEPARTMENT_OTHER): Payer: Medicare Other | Attending: General Surgery | Admitting: General Surgery

## 2022-05-01 DIAGNOSIS — I509 Heart failure, unspecified: Secondary | ICD-10-CM | POA: Insufficient documentation

## 2022-05-01 DIAGNOSIS — E10622 Type 1 diabetes mellitus with other skin ulcer: Secondary | ICD-10-CM | POA: Insufficient documentation

## 2022-05-01 DIAGNOSIS — I4891 Unspecified atrial fibrillation: Secondary | ICD-10-CM | POA: Diagnosis not present

## 2022-05-01 DIAGNOSIS — L98492 Non-pressure chronic ulcer of skin of other sites with fat layer exposed: Secondary | ICD-10-CM | POA: Insufficient documentation

## 2022-05-01 DIAGNOSIS — E1042 Type 1 diabetes mellitus with diabetic polyneuropathy: Secondary | ICD-10-CM | POA: Insufficient documentation

## 2022-05-01 DIAGNOSIS — L89322 Pressure ulcer of left buttock, stage 2: Secondary | ICD-10-CM | POA: Diagnosis not present

## 2022-05-01 DIAGNOSIS — I110 Hypertensive heart disease with heart failure: Secondary | ICD-10-CM | POA: Diagnosis not present

## 2022-05-01 DIAGNOSIS — G8220 Paraplegia, unspecified: Secondary | ICD-10-CM | POA: Insufficient documentation

## 2022-05-01 NOTE — Progress Notes (Signed)
TEO, MOEDE ([id number]) [id number] Page 1 of 9 Visit Report for 05/01/2022 Arrival Information Details Patient Name: Date of Service: [name] 05/01/2022 11:00 A M Medical Record Number: [id number] Patient Account Number: [id number] Date of Birth/Sex: Treating RN: [birth year] (71 y.o. Collene Gobble Primary Care Cynthea Zachman: [name] Other Clinician: Referring Eston Heslin: Treating Amberle Lyter/Extender: [name]: 6 Visit Information History Since Last Visit Added or deleted any medications: No Patient Arrived: Stretcher Any new allergies or adverse reactions: No Arrival Time: 11:00 Had a fall or experienced change in No Accompanied By: sister activities of daily living that may affect Transfer Assistance: None risk of falls: Patient Identification Verified: Yes Signs or symptoms of abuse/neglect since last visito No Patient Requires Transmission-Based Precautions: No Hospitalized since last visit: No Implantable device outside of the clinic excluding No cellular tissue based products placed in the center since last visit: Has Dressing in Place as Prescribed: Yes Pain Present Now: Yes Electronic Signature(s) Signed: 05/01/2022 2:05:46 PM By: Dellie Catholic RN Entered By: Dellie Catholic on 05/01/2022 11:45:46 -------------------------------------------------------------------------------- Encounter Discharge Information Details Patient Name: Date of Service: [name]. 05/01/2022 11:00 A M Medical Record Number: [id number] Patient Account Number: [id number] Date of Birth/Sex: Treating RN: 07-16-1950 (72 y.o. Collene Gobble Primary Care Ladarrell Cornwall: [name] Other Clinician: Referring Braedan Meuth: Treating Adrie Picking/Extender: [name]: 6 Encounter Discharge Information Items Post Procedure Vitals Discharge Condition: Stable Temperature (F):  97.5 Ambulatory Status: Stretcher Pulse (bpm): 73 Discharge Destination: Home Respiratory Rate (breaths/min): 18 Transportation: Ambulance Blood Pressure (mmHg): 159/99 Accompanied By: sister Schedule Follow-up Appointment: Yes Clinical Summary of Care: Patient Declined Electronic Signature(s) Signed: 05/01/2022 2:05:46 PM By: Dellie Catholic RN Entered By: Dellie Catholic on 05/01/2022 11:50:27 [name] ([id number]) [id number] Page 2 of 9 -------------------------------------------------------------------------------- Lower Extremity Assessment Details Patient Name: Date of Service: [name] 05/01/2022 11:00 A M Medical Record Number: [id number] Patient Account Number: [id number] Date of Birth/Sex: Treating RN: 1950-10-14 (71 y.o. Collene Gobble Primary Care Adrin Julian: [name] Other Clinician: Referring Haliyah Fryman: Treating Kenetra Hildenbrand/Extender: [name]: 6 Electronic Signature(s) Signed: 05/01/2022 2:05:46 PM By: Dellie Catholic RN Entered By: Dellie Catholic on 05/01/2022 11:47:43 -------------------------------------------------------------------------------- Multi Wound Chart Details Patient Name: Date of Service: [name]. 05/01/2022 11:00 A M Medical Record Number: [id number] Patient Account Number: [id number] Date of Birth/Sex: Treating RN: February 12, 1951 (71 y.o. M) Primary Care Eugune Sine: [name] Other Clinician: Referring Lacy Taglieri: Treating Taneil Lazarus/Extender: [name]: 6 Vital Signs Height(in): 77 Pulse(bpm): 31 Weight(lbs): 318 Blood Pressure(mmHg): 159/99 Body Mass Index(BMI): 37.7 Temperature(F): 97.5 Respiratory Rate(breaths/min): 18 [3:Photos:] [N/A:N/A] Left Gluteus Abdomen - Lower Quadrant N/A Wound Location: Gradually Appeared Gradually Appeared N/A Wounding Event: Pressure Ulcer Skin T ear N/A Primary  Etiology: Congestive Heart Failure, Congestive Heart Failure, N/A Comorbid History: Hypertension, Type I Diabetes, Hypertension, Type I Diabetes, Neuropathy, Paraplegia Neuropathy, Paraplegia 05/14/2021 01/17/2022 N/A Date Acquired: 6 6 N/A Weeks of Treatment: Open Healed - Epithelialized N/A Wound Status: No No N/A Wound Recurrence: 24x17x0.6 0x0x0 N/A Measurements L x W x D (cm) 320.442 0 N/A A (cm) : rea 192.265 0 N/A Volume (cm) : -92.00% 100.00% N/A % Reduction in Area: -1052.00% 100.00% N/A % Reduction in Volume: Category/Stage III Full Thickness Without Exposed N/A Classification: Support Structures Medium None Present N/A Exudate Amount: Serosanguineous N/A N/A Exudate Type: red, brown N/A N/A Exudate Color:  Flat and Intact Indistinct, nonvisible N/A Wound Margin: Medium (34-66%) None Present (0%) N/A Granulation Amount: DUVID, SMALLS ([id number]) [id number] Page 3 of 9 Red, Hyper-granulation, Friable N/A N/A Granulation Quality: Medium (34-66%) None Present (0%) N/A Necrotic Amount: Fat Layer (Subcutaneous Tissue): Yes Fascia: No N/A Exposed Structures: Fascia: No Fat Layer (Subcutaneous Tissue): No Tendon: No Tendon: No Muscle: No Muscle: No Joint: No Joint: No Bone: No Bone: No None Large (67-100%) N/A Epithelialization: Debridement - Selective/Open Wound N/A N/A Debridement: Pre-procedure Verification/Time Out 11:20 N/A N/A Taken: Lidocaine 4% Topical Solution N/A N/A Pain Control: Slough N/A N/A Tissue Debrided: Non-Viable Tissue N/A N/A Level: 408 N/A N/A Debridement A (sq cm): rea Curette N/A N/A Instrument: Minimum N/A N/A Bleeding: Pressure N/A N/A Hemostasis A chieved: 0 N/A N/A Procedural Pain: 0 N/A N/A Post Procedural Pain: Procedure was tolerated well N/A N/A Debridement Treatment Response: 24x17x0.6 N/A N/A Post Debridement Measurements L x W x D (cm) 192.265 N/A N/A Post Debridement  Volume: (cm) Category/Stage III N/A N/A Post Debridement Stage: Excoriation: Yes Rash: Yes N/A Periwound Skin Texture: Rash: Yes Crepitus: No Maceration: Yes No Abnormalities Noted N/A Periwound Skin Moisture: Erythema: No No Abnormalities Noted N/A Periwound Skin Color: No Abnormality No Abnormality N/A Temperature: Debridement N/A N/A Procedures Performed: Treatment Notes Wound #3 (Gluteus) Wound Laterality: Left Cleanser Soap and Water Discharge Instruction: May shower and wash wound with dial antibacterial soap and water prior to dressing change. Wound Cleanser Discharge Instruction: Cleanse the wound with wound cleanser prior to applying a clean dressing using gauze sponges, not tissue or cotton balls. Peri-Wound Care Zinc Oxide Ointment 30g tube Discharge Instruction: Apply Zinc Oxide to periwound with each dressing change Topical Primary Dressing KerraCel Ag Gelling Fiber Dressing, 4x5 in (silver alginate) Discharge Instruction: Apply silver alginate to wound bed as instructed Secondary Dressing ABD Pad, 8x10 Discharge Instruction: Apply over primary dressing as directed. Secured With [employer] Surgical T 2x10 (in/yd) ape Discharge Instruction: Secure with tape as directed. Compression Wrap Compression Stockings Add-Ons Electronic Signature(s) Signed: 05/01/2022 12:05:22 PM By: Fredirick Maudlin MD FACS Entered By: Fredirick Maudlin on 05/01/2022 12:05:22 [name] ([id number]) [id number] Page 4 of 9 -------------------------------------------------------------------------------- Multi-Disciplinary Care Plan Details Patient Name: Date of Service: [name] 05/01/2022 11:00 A M Medical Record Number: [id number] Patient Account Number: [id number] Date of Birth/Sex: Treating RN: 1950/12/24 (71 y.o. Collene Gobble Primary Care Rollie Hynek: [name] Other Clinician: Referring Estera Ozier: Treating  Oney Tatlock/Extender: [name]: 6 Active Inactive Nutrition Nursing Diagnoses: Potential for alteratiion in Nutrition/Potential for imbalanced nutrition Goals: Patient/caregiver verbalizes understanding of need to maintain therapeutic glucose control per primary care physician Date Initiated: 03/19/2022 Target Resolution Date: 11/08/2022 Goal Status: Active Interventions: Provide education on elevated blood sugars and impact on wound healing Treatment Activities: Dietary management education, guidance and counseling : 03/19/2022 Notes: Orientation to the Wound Care Program Nursing Diagnoses: Knowledge deficit related to the wound healing center program Goals: Patient/caregiver will verbalize understanding of the [facility] Date Initiated: 03/19/2022 Target Resolution Date: 11/08/2022 Goal Status: Active Interventions: Provide education on orientation to the wound center Notes: Wound/Skin Impairment Nursing Diagnoses: Knowledge deficit related to ulceration/compromised skin integrity Goals: Ulcer/skin breakdown will have a volume reduction of 30% by week 4 Date Initiated: 03/19/2022 Target Resolution Date: 11/08/2022 Goal Status: Active Interventions: Assess ulceration(s) every visit Provide education on ulcer and skin care Treatment Activities: Skin care regimen initiated : 03/19/2022 Notes: Electronic Signature(s)  Signed: 05/01/2022 2:05:46 PM By: Dellie Catholic RN Entered By: Dellie Catholic on 05/01/2022 11:48:34 [name] ([id number]) [id number] Page 5 of 9 -------------------------------------------------------------------------------- Pain Assessment Details Patient Name: Date of Service: [name] 05/01/2022 11:00 A M Medical Record Number: [id number] Patient Account Number: [id number] Date of Birth/Sex: Treating RN: 1950/10/20 (71 y.o. Collene Gobble Primary Care  Lou Loewe: [name] Other Clinician: Referring Shavon Ashmore: Treating Sumayya Muha/Extender: [name]: 6 Active Problems Location of Pain Severity and Description of Pain Patient Has Paino Yes Site Locations Pain Location: Generalized Pain With Dressing Change: Yes Duration of the Pain. Constant / Intermittento Constant Rate the pain. Current Pain Level: 7 Worst Pain Level: 10 Least Pain Level: 7 Tolerable Pain Level: 7 Character of Pain Describe the Pain: Difficult to Pinpoint Pain Management and Medication Current Pain Management: Medication: Yes Cold Application: No Rest: Yes Massage: No Activity: No T.E.N.S.: No Heat Application: No Leg drop or elevation: No Is the Current Pain Management Adequate: Adequate Electronic Signature(s) Signed: 05/01/2022 2:05:46 PM By: Dellie Catholic RN Entered By: Dellie Catholic on 05/01/2022 11:47:24 -------------------------------------------------------------------------------- Patient/Caregiver Education Details Patient Name: Date of Service: [name] 12/21/2023andnbsp11:00 A M Medical Record Number: [id number] Patient Account Number: [id number] Date of Birth/Gender: Treating RN: [birth year] (71 y.o. Collene Gobble Primary Care Physician: [name] Other Clinician: Referring Physician: Treating Physician/Extender: [name]: 6 Education Assessment Education Provided To: ALANI, LACIVITA ([id number]) [id number] Page 6 of 9 Patient Education Topics Provided Wound/Skin Impairment: Methods: Explain/Verbal Responses: Return demonstration correctly Electronic Signature(s) Signed: 05/01/2022 2:05:46 PM By: Dellie Catholic RN Entered By: Dellie Catholic on 05/01/2022 11:49:25 -------------------------------------------------------------------------------- Wound Assessment Details Patient Name: Date of  Service: [name]. 05/01/2022 11:00 A M Medical Record Number: [id number] Patient Account Number: [id number] Date of Birth/Sex: Treating RN: 1951/01/01 (71 y.o. Collene Gobble Primary Care Rihaan Barrack: [name] Other Clinician: Referring Lilo Wallington: Treating Armentha Branagan/Extender: [name]: 6 Wound Status Wound Number: 3 Primary Pressure Ulcer Etiology: Wound Location: Left Gluteus Wound Open Wounding Event: Gradually Appeared Status: Date Acquired: 05/14/2021 Comorbid Congestive Heart Failure, Hypertension, Type I Diabetes, Weeks Of Treatment: 6 History: Neuropathy, Paraplegia Clustered Wound: No Photos Wound Measurements Length: (cm) 24 Width: (cm) 17 Depth: (cm) 0.6 Area: (cm) 320.442 Volume: (cm) 192.265 % Reduction in Area: -92% % Reduction in Volume: -1052% Epithelialization: None Tunneling: No Undermining: No Wound Description Classification: Category/Stage III Wound Margin: Flat and Intact Exudate Amount: Medium Exudate Type: Serosanguineous Exudate Color: red, brown Foul Odor After Cleansing: No Slough/Fibrino Yes Wound Bed Granulation Amount: Medium (34-66%) Exposed Structure Granulation Quality: Red, Hyper-granulation, Friable Fascia Exposed: No Necrotic Amount: Medium (34-66%) Fat Layer (Subcutaneous Tissue) Exposed: Yes Necrotic Quality: Adherent Slough Tendon Exposed: No Muscle Exposed: No Joint Exposed: No Bone Exposed: No FERRIS, FIELDEN ([id number]) [id number] Page 7 of 9 Periwound Skin Texture Texture Color No Abnormalities Noted: Yes No Abnormalities Noted: Yes Moisture Temperature / Pain No Abnormalities Noted: Yes Temperature: No Abnormality Treatment Notes Wound #3 (Gluteus) Wound Laterality: Left Cleanser Soap and Water Discharge Instruction: May shower and wash wound with dial antibacterial soap and water prior to dressing change. Wound Cleanser Discharge  Instruction: Cleanse the wound with wound cleanser prior to applying a clean dressing using gauze sponges, not tissue or cotton balls. Peri-Wound Care Zinc Oxide Ointment 30g tube Discharge Instruction: Apply Zinc Oxide to periwound with each dressing change Topical Primary Dressing  KerraCel Ag Gelling Fiber Dressing, 4x5 in (silver alginate) Discharge Instruction: Apply silver alginate to wound bed as instructed Secondary Dressing ABD Pad, 8x10 Discharge Instruction: Apply over primary dressing as directed. Secured With [employer] Surgical T 2x10 (in/yd) ape Discharge Instruction: Secure with tape as directed. Compression Wrap Compression Stockings Add-Ons Electronic Signature(s) Signed: 05/01/2022 2:05:46 PM By: Dellie Catholic RN Entered By: Dellie Catholic on 05/01/2022 11:11:36 -------------------------------------------------------------------------------- Wound Assessment Details Patient Name: Date of Service: [name] 05/01/2022 11:00 A M Medical Record Number: [id number] Patient Account Number: [id number] Date of Birth/Sex: Treating RN: 21-Mar-1951 (71 y.o. Collene Gobble Primary Care Karinne Schmader: [name] Other Clinician: Referring Mackenzey Crownover: Treating Landry Kamath/Extender: [name]: 6 Wound Status Wound Number: 4 Primary Skin Tear Etiology: Wound Location: Abdomen - Lower Quadrant Wound Healed - Epithelialized Wounding Event: Gradually Appeared Status: Date Acquired: 01/17/2022 Comorbid Congestive Heart Failure, Hypertension, Type I Diabetes, Weeks Of Treatment: 6 History: Neuropathy, Paraplegia Clustered Wound: No Photos GUILLERMO, NEHRING ([id number]) [id number] Page 8 of 9 Wound Measurements Length: (cm) Width: (cm) Depth: (cm) Area: (cm) Volume: (cm) 0 % Reduction in Area: 100% 0 % Reduction in Volume: 100% 0 Epithelialization: Large (67-100%) 0 Tunneling: No 0  Undermining: No Wound Description Classification: Full Thickness Without Exposed Support Structures Wound Margin: Indistinct, nonvisible Exudate Amount: None Present Foul Odor After Cleansing: No Slough/Fibrino No Wound Bed Granulation Amount: None Present (0%) Exposed Structure Necrotic Amount: None Present (0%) Fascia Exposed: No Fat Layer (Subcutaneous Tissue) Exposed: No Tendon Exposed: No Muscle Exposed: No Joint Exposed: No Bone Exposed: No Periwound Skin Texture Texture Color No Abnormalities Noted: Yes No Abnormalities Noted: Yes Moisture Temperature / Pain No Abnormalities Noted: Yes Temperature: No Abnormality Electronic Signature(s) Signed: 05/01/2022 2:05:46 PM By: Dellie Catholic RN Entered By: Dellie Catholic on 05/01/2022 11:35:18 -------------------------------------------------------------------------------- Vitals Details Patient Name: Date of Service: [name]. 05/01/2022 11:00 A M Medical Record Number: [id number] Patient Account Number: [id number] Date of Birth/Sex: Treating RN: November 07, 1950 (71 y.o. Collene Gobble Primary Care Lyllie Cobbins: [name] Other Clinician: Referring Sallee Hogrefe: Treating Lyndal Reggio/Extender: [name]: 6 Vital Signs Time Taken: 11:10 Temperature (F): 97.5 Height (in): 77 Pulse (bpm): 73 Weight (lbs): 318 Respiratory Rate (breaths/min): 18 Body Mass Index (BMI): 37.7 Blood Pressure (mmHg): 159/99 Reference Range: 80 - 120 mg / dl Electronic Signature(s) Signed: 05/01/2022 2:05:46 PM By: Dellie Catholic RN Entered By: Dellie Catholic on 05/01/2022 [place], Maury ([id number]) [id number] Page 9 of 9

## 2022-05-01 NOTE — Progress Notes (Signed)
HOLT, WOOLBRIGHT ([id number]) [id number] Page 1 of 9 Visit Report for 05/01/2022 Chief Complaint Document Details Patient Name: Date of Service: [name] 05/01/2022 11:00 A M Medical Record Number: [id number] Patient Account Number: [id number] Date of Birth/Sex: Treating RN: 04-23-1951 (71 y.o. M) Primary Care Provider: [name] Other Clinician: Referring Provider: Treating Provider/Extender: [name]: 6 Information Obtained from: Patient Chief Complaint Here for follow up abdominal wound present following laparotomy, colostomy. 03/19/2022: Here for pressure ulcer on left gluteus and ulcer adjacent to ileostomy Electronic Signature(s) Signed: 05/01/2022 12:05:31 PM By: Fredirick Maudlin MD FACS Entered By: Fredirick Maudlin on 05/01/2022 12:05:30 -------------------------------------------------------------------------------- Debridement Details Patient Name: Date of Service: [name]. 05/01/2022 11:00 A M Medical Record Number: [id number] Patient Account Number: [id number] Date of Birth/Sex: Treating RN: 11-20-1950 (71 y.o. Collene Gobble Primary Care Provider: [name] Other Clinician: Referring Provider: Treating Provider/Extender: [name]: 6 Debridement Performed for Assessment: Wound #3 Left Gluteus Performed By: Physician Fredirick Maudlin, MD Debridement Type: Debridement Level of Consciousness (Pre-procedure): Awake and Alert Pre-procedure Verification/Time Out Yes - 11:20 Taken: Start Time: 11:20 Pain Control: Lidocaine 4% T opical Solution T Area Debrided (L x W): otal 24 (cm) x 17 (cm) = 408 (cm) Tissue and other material debrided: Non-Viable, [place] Level: Non-Viable Tissue Debridement Description: Selective/Open Wound Instrument: Curette Bleeding: Minimum Hemostasis Achieved: Pressure End Time: 11:21 Procedural Pain:  0 Post Procedural Pain: 0 Response to Treatment: Procedure was tolerated well Level of Consciousness (Post- Awake and Alert procedure): Post Debridement Measurements of Total Wound Length: (cm) 24 Stage: Category/Stage III Width: (cm) 17 Depth: (cm) 0.6 Volume: (cm) 192.265 Character of Wound/Ulcer Post Debridement: Improved JOAOPEDRO, ESCHBACH ([id number]) [id number] Page 2 of 9 Post Procedure Diagnosis Same as Pre-procedure Notes Scribed for Dr. Celine Ahr by J.Scotton Electronic Signature(s) Signed: 05/01/2022 12:30:32 PM By: Fredirick Maudlin MD FACS Signed: 05/01/2022 2:05:46 PM By: Dellie Catholic RN Entered By: Dellie Catholic on 05/01/2022 11:43:55 -------------------------------------------------------------------------------- HPI Details Patient Name: Date of Service: [name]. 05/01/2022 11:00 A M Medical Record Number: [id number] Patient Account Number: [id number] Date of Birth/Sex: Treating RN: Aug 16, 1950 (71 y.o. M) Primary Care Provider: [name] Other Clinician: Referring Provider: Treating Provider/Extender: [name]: 6 History of Present Illness HPI Description: On medihoney with border dressing due to stalling on collagen and concern ABD was pulling off skin. 09/11/14 Nearly healed, counseled given the thin attenuated scar skin I expect will have some recurrence wounds with slight trauma or maceration, however much improved today. cont ABD pads and medihoney. 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 He resides with his sister and is on a regular  hospital bed. The 2 of them reported that for about the last year, he has had ulceration adjacent to his ileostomy as well as on his left gluteus and upper posterior thigh. Most recent hemoglobin A1c was 6.3%. On his left gluteus, there is a stage II pressure ulcer with surrounding tissue maceration and excoriation. There is a little slough on the wound surface. Adjacent to his ileostomy, associated with his appliance, there is an ulcer that extends into the fat layer.  It is clean without any slough or necrotic tissue. 04/10/2022: The left gluteal ulcer has expanded and is quite a bit larger today. There is slough and eschar accumulation. The patient's sister reports that the ileostomy associated wound got better and so they did not go to see the outpatient ostomy nurse. They have not received the low-air-loss mattress. 05/01/2022: Apparently the low-air-loss mattress has been denied by his insurance, but they are willing to cover a gel mattress overlay. The ileostomy-area wound has contracted further, per the patient's sister; we are not managing this wound at this time. The left gluteal ulcer seems to be about the same size, but there is extensive slough on all of the open surfaces. His skin is extremely friable. Electronic Signature(s) Signed: 05/01/2022 12:07:00 PM By: Fredirick Maudlin MD FACS Entered By: Fredirick Maudlin on 05/01/2022 12:07:00 -------------------------------------------------------------------------------- Physical Exam Details Patient Name: Date of Service: [name]. 05/01/2022 11:00 A M Medical Record Number: [id number] Patient Account Number: [id number] Date of Birth/Sex: Treating RN: 18-Mar-1951 (71 y.o. M) Primary Care Provider: [name] Other Clinician: Referring Provider: Treating Provider/Extender: [name]: 6 Constitutional He is hypertensive, but asymptomatic.. . . . No acute distress. GUNNER, IODICE  ([id number]) [id number] Page 3 of 9 Respiratory Normal work of breathing on room air. Notes 05/01/2022: The left gluteal ulcer seems to be about the same size, but there is extensive slough on all of the open surfaces. His skin is extremely friable. Electronic Signature(s) Signed: 05/01/2022 12:07:41 PM By: Fredirick Maudlin MD FACS Entered By: Fredirick Maudlin on 05/01/2022 12:07:40 -------------------------------------------------------------------------------- Physician Orders Details Patient Name: Date of Service: [name]. 05/01/2022 11:00 A M Medical Record Number: [id number] Patient Account Number: [id number] Date of Birth/Sex: Treating RN: [birth year] (71 y.o. 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 - Dr. Celine Ahr Rm 3 ***Needs extra time, STRETCHER**** Off-Loading Wound #3 Left Gluteus Turn and reposition every 2 hours Home Health Wound #3 Left Gluteus Other Home Health Orders/Instructions: - [place] [facility] Treatment Wound #3 - Gluteus Wound Laterality: Left Cleanser: Soap and Water 1 x Per Day/30 Days Discharge Instructions: May shower and wash wound with dial antibacterial soap and water prior to dressing change. Cleanser: Wound Cleanser (Generic) 1 x Per Day/30 Days Discharge Instructions: Cleanse the wound with wound cleanser prior to applying a clean  dressing using gauze sponges, not tissue or cotton balls. Peri-Wound Care: Zinc Oxide Ointment 30g tube (Generic) 1 x Per Day/30 Days Discharge Instructions: Apply Zinc Oxide to periwound with each dressing change Prim Dressing: KerraCel Ag Gelling Fiber Dressing, 4x5 in (silver alginate) (Generic) 1 x Per Day/30 Days ary Discharge Instructions: Apply silver alginate to wound bed as instructed Secondary Dressing: ABD Pad, 8x10 (Generic) 1 x Per Day/30 Days Discharge Instructions: Apply over primary dressing as directed. Secured With: 69M Medipore Public affairs consultant Surgical T 2x10 (in/yd) (Generic) 1 x Per Day/30 Days ape Discharge Instructions: Secure with tape as directed. KOLETON, DUCHEMIN ([id number]) [id number] Page 4 of 9  Electronic Signature(s) Signed: 05/01/2022 12:30:32 PM By: Fredirick Maudlin MD FACS Entered By: Fredirick Maudlin on 05/01/2022 12:10:47 -------------------------------------------------------------------------------- Problem List Details Patient Name: Date of Service: [name]. 05/01/2022 11:00 A M Medical Record Number: [id number] Patient Account Number: [id number] Date of Birth/Sex: Treating RN: [birth year] (71 y.o. M) Primary Care Provider: [name] Other Clinician: Referring Provider: Treating Provider/Extender: [name]: 6 Active Problems ICD-10 Encounter Code Description Active Date MDM Diagnosis [phone] Pressure ulcer of left buttock, stage 2 03/19/2022 No Yes L98.492 Non-pressure chronic ulcer of skin of other sites with fat layer exposed 03/19/2022 No Yes E66.01 Morbid (severe) obesity due to excess calories 03/19/2022 No Yes G62.89 Other specified polyneuropathies 03/19/2022 No Yes Z93.2 Ileostomy status 03/19/2022 No Yes E11.622 Type 2 diabetes mellitus with other skin ulcer 03/19/2022 No Yes Inactive Problems Resolved Problems Electronic Signature(s) Signed: 05/01/2022 12:04:00 PM  By: Fredirick Maudlin MD FACS Entered By: Fredirick Maudlin on 05/01/2022 12:04:00 -------------------------------------------------------------------------------- Progress Note Details Patient Name: Date of Service: [name]. 05/01/2022 11:00 A M Medical Record Number: [id number] Patient Account Number: [id number] Date of Birth/Sex: Treating RN: 1950/11/17 (71 y.o. Oral Remache, Denice Bors ([id number]) [id number] Page 5 of 9 Primary Care Provider: [name] Other Clinician: Referring Provider: Treating Provider/Extender: [name]: 6 Subjective Chief Complaint Information obtained from Patient Here for follow up abdominal wound present following laparotomy, colostomy. 03/19/2022: Here for pressure ulcer on left gluteus and ulcer adjacent to ileostomy History of Present Illness (HPI) On medihoney with border dressing due to stalling on collagen and concern ABD was pulling off skin. 09/11/14 Nearly healed, counseled given the thin attenuated scar skin I expect will have some recurrence wounds with slight trauma or maceration, however much improved today. cont ABD pads and medihoney. 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 He resides with his sister and is on a regular hospital bed. The 2 of them reported that for about the last year, he has had ulceration adjacent to his ileostomy as well as on his left gluteus and upper posterior thigh. Most recent hemoglobin A1c was 6.3%. On his left gluteus, there is a stage II pressure ulcer with surrounding tissue maceration and excoriation.  There is a little slough on the wound surface. Adjacent to his ileostomy, associated with his appliance, there is an ulcer that extends into the fat layer. It is clean without any slough or necrotic tissue. 04/10/2022: The left gluteal ulcer has expanded and is quite a bit larger today. There is slough and eschar accumulation. The patient's sister reports that the ileostomy associated wound got better and so they did not go to see the outpatient ostomy nurse. They have not received the low-air-loss mattress. 05/01/2022: Apparently the low-air-loss mattress has been denied by his insurance, but they are willing to cover a gel mattress overlay. The ileostomy-area wound has contracted further, per the patient's sister; we are not managing this wound at this time. The left gluteal ulcer seems to be about the same size, but there is extensive slough on all of the open surfaces. His skin is extremely friable. Patient History Information obtained from Patient. Family History Cancer - Father,Mother, Diabetes - Father,Mother,Siblings, Heart  Disease - Mother,Siblings, Hypertension - Father,Siblings, Kidney Disease - Siblings. Social History Former smoker - ended on 08/10/2013, Marital Status - Single, Alcohol Use - Rarely, Drug Use - No History, Caffeine Use - Rarely. Medical History Constitutional Symptoms (General Health) Patient has history of Congestive Heart Failure Eyes Denies history of Cataracts, Glaucoma, Optic Neuritis Ear/Nose/Mouth/Throat Denies history of Chronic sinus problems/congestion, Middle ear problems Cardiovascular Patient has history of Hypertension Endocrine Patient has history of Type I Diabetes Denies history of Type 1 Diabetes Genitourinary Denies history of End Stage Renal Disease Neurologic Patient has history of Neuropathy, Paraplegia Psychiatric Denies history of Anorexia/bulimia, Confinement Anxiety Medical A Surgical History Notes nd Cardiovascular atrial  fibrillation Musculoskeletal arthritis Objective Constitutional He is hypertensive, but asymptomatic.Marland Kitchen No acute distress. Vitals Time Taken: 11:10 AM, Height: 77 in, Weight: 318 lbs, BMI: 37.7, Temperature: 97.5 F, Pulse: 73 bpm, Respiratory Rate: 18 breaths/min, Blood Pressure: 159/99 mmHg. Respiratory JOSHVA, LABRECK ([id number]) [id number] Page 6 of 9 Normal work of breathing on room air. General Notes: 05/01/2022: The left gluteal ulcer seems to be about the same size, but there is extensive slough on all of the open surfaces. His skin is extremely friable. Integumentary (Hair, Skin) Wound #3 status is Open. Original cause of wound was Gradually Appeared. The date acquired was: 05/14/2021. The wound has been in treatment 6 weeks. The wound is located on the Left Gluteus. The wound measures 24cm length x 17cm width x 0.6cm depth; 320.442cm^2 area and 192.265cm^3 volume. There is Fat Layer (Subcutaneous Tissue) exposed. There is no tunneling or undermining noted. There is a medium amount of serosanguineous drainage noted. The wound margin is flat and intact. There is medium (34-66%) red, friable, hyper - granulation within the wound bed. There is a medium (34-66%) amount of necrotic tissue within the wound bed including Adherent Slough. The periwound skin appearance had no abnormalities noted for texture. The periwound skin appearance had no abnormalities noted for moisture. The periwound skin appearance had no abnormalities noted for color. Periwound temperature was noted as No Abnormality. Wound #4 status is Healed - Epithelialized. Original cause of wound was Gradually Appeared. The date acquired was: 01/17/2022. The wound has been in treatment 6 weeks. The wound is located on the Abdomen - Lower Quadrant. The wound measures 0cm length x 0cm width x 0cm depth; 0cm^2 area and 0cm^3 volume. There is no tunneling or undermining noted. There is a none present amount  of drainage noted. The wound margin is indistinct and nonvisible. There is no granulation within the wound bed. There is no necrotic tissue within the wound bed. The periwound skin appearance had no abnormalities noted for texture. The periwound skin appearance had no abnormalities noted for moisture. The periwound skin appearance had no abnormalities noted for color. Periwound temperature was noted as No Abnormality. 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 There was a Selective/Open Wound Non-Viable Tissue Debridement with a total area of 408 sq cm performed by Fredirick Maudlin, MD. With the following instrument(s): Curette to remove Non-Viable tissue/material. Material removed includes [facility] after achieving pain control using Lidocaine 4% Topical Solution. No specimens were taken. A time out was conducted at 11:20, prior to  the start of the procedure. A Minimum amount of bleeding was controlled with Pressure. The procedure was tolerated well with a pain level of 0 throughout and a pain level of 0 following the procedure. Post Debridement Measurements: 24cm length x 17cm width x 0.6cm depth; 192.265cm^3 volume. Post debridement Stage noted as Category/Stage III. Character of Wound/Ulcer Post Debridement is improved. Post procedure Diagnosis Wound #3: Same as Pre-Procedure General Notes: Scribed for Dr. Celine Ahr by J.Scotton. Plan Follow-up Appointments: Return appointment in 3 weeks. - Dr. Celine Ahr Rm 3 ***Needs extra time, STRETCHER**** Off-Loading: Wound #3 Left Gluteus: Turn and reposition every 2 hours Home Health: Wound #3 Left  Gluteus: Other Home Health Orders/Instructions: - [place] #3: - Gluteus Wound Laterality: Left Cleanser: Soap and Water 1 x Per Day/30 Days Discharge Instructions: May shower and wash wound with dial antibacterial soap and water prior to dressing change. Cleanser: Wound Cleanser (Generic) 1 x Per Day/30 Days Discharge Instructions: Cleanse the wound with wound cleanser prior to applying a clean dressing using gauze sponges, not tissue or cotton balls. Peri-Wound Care: Zinc Oxide Ointment 30g tube (Generic) 1 x Per Day/30 Days Discharge Instructions: Apply Zinc Oxide to periwound with each dressing change Prim Dressing: KerraCel Ag Gelling Fiber Dressing, 4x5 in (silver alginate) (Generic) 1 x Per Day/30 Days ary Discharge Instructions: Apply silver alginate to wound bed as instructed Secondary Dressing: ABD Pad, 8x10 (Generic) 1 x Per Day/30 Days Discharge Instructions: Apply over primary dressing as directed. Secured With: 53M Medipore Public affairs consultant Surgical T 2x10 (in/yd) (Generic) 1 x Per Day/30 Days ape Discharge Instructions: Secure with tape as directed. 05/01/2022: The left gluteal ulcer seems to be about the same size, but there is extensive slough on all of the open surfaces. His skin is extremely friable. RAJAH, TAGLIAFERRO ([id number]) [id number] Page 7 of 9 I used a curette to debride slough off of all of the open surfaces on his left gluteal wound. We will continue silver alginate. Hopefully the gel mattress overlay will be delivered; I am not sure why his insurance does not feel he qualifies for a low-air-loss mattress given his immobility. Follow-up in 3 weeks. Electronic Signature(s) Signed: 05/01/2022 12:14:21 PM By: Fredirick Maudlin MD FACS Entered By: Fredirick Maudlin on 05/01/2022 12:14:21 -------------------------------------------------------------------------------- HxROS Details Patient Name: Date of  Service: [name]. 05/01/2022 11:00 A M Medical Record Number: [id number] Patient Account Number: [id number] Date of Birth/Sex: Treating RN: October 30, 1950 (71 y.o. M) Primary Care Provider: [name] Other Clinician: Referring Provider: Treating Provider/Extender: [name]: 6 Information Obtained From Patient Constitutional Symptoms (General Health) Medical History: Positive for: Congestive Heart Failure Eyes Medical History: Negative for: Cataracts; Glaucoma; Optic Neuritis Ear/Nose/Mouth/Throat Medical History: Negative for: Chronic sinus problems/congestion; Middle ear problems Cardiovascular Medical History: Positive for: Hypertension Past Medical History Notes: atrial fibrillation Endocrine Medical History: Positive for: Type I Diabetes Negative for: Type 1 Diabetes Time with diabetes: less then 10 Treated with: Oral agents Genitourinary Medical History: Negative for: End Stage Renal Disease Musculoskeletal Medical History: Past Medical History Notes: arthritis Neurologic Medical History: Positive for: Neuropathy; Paraplegia Psychiatric Medical HistoryMarland Kitchen JAMAAR, HOWES ([id number]) [id number] Page 8 of 9 Negative for: Anorexia/bulimia; Confinement Anxiety Immunizations Pneumococcal Vaccine: Received Pneumococcal Vaccination: Yes Received Pneumococcal Vaccination On or After 60th Birthday: Yes Implantable Devices None Family and Social History Cancer: Yes - Father,Mother; Diabetes: Yes - Father,Mother,Siblings; Heart Disease: Yes - Mother,Siblings; Hypertension: Yes - Father,Siblings; Kidney Disease: Yes -  Siblings; Former smoker - ended on 08/10/2013; Marital Status - Single; Alcohol Use: Rarely; Drug Use: No History; Caffeine Use: Rarely; Financial Concerns: No; Food, Clothing or Shelter Needs: No; Support System Lacking: No; Transportation Concerns: No Electronic  Signature(s) Signed: 05/01/2022 12:30:32 PM By: Fredirick Maudlin MD FACS Entered By: Fredirick Maudlin on 05/01/2022 12:07:07 -------------------------------------------------------------------------------- SuperBill Details Patient Name: Date of Service: [name] 05/01/2022 Medical Record Number: [id number] Patient Account Number: [id number] Date of Birth/Sex: Treating RN: 02/05/1951 (71 y.o. 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 Facility Procedures : CPT4 Code: [id number] Description: 04888 - DEBRIDE WOUND 1ST 20 SQ CM OR < ICD-10 Diagnosis Description 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, stage 2 Modifier: Quantity: 1 : CPT4 Code: [id number] Description: 88828 - DEBRIDE WOUND EA ADDL 20 SQ CM ICD-10 Diagnosis Description 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, stage 2 Modifier: Quantity: 20 Physician Procedures : CPT4 Code Description Modifier [id number] - WC PHYS LEVEL 3 - EST PT 25 ICD-10 Diagnosis Description 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, stage 2 E66.01 Morbid (severe) obesity due to excess calories E11.622 Type 2 diabetes mellitus with other  skin ulcer L98.492 Non-pressure chronic ulcer of skin of other sites with fat layer exposed Quantity: 1 : [id number] 16553 - WC PHYS DEBR WO ANESTH 20 SQ CM ICD-10 Diagnosis Description TANUSH, DREES ([id number]) [id number] Pag ICD-10 Diagnosis Description 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, stage 2 Quantity: 1 e 9 of 9 :  [id number] 97598 - WC PHYS DEBR WO ANESTH EA ADD 20 CM 20 ICD-10 Diagnosis Description 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, stage 2 Quantity: Electronic Signature(s) Signed: 05/01/2022 12:17:48 PM By: Fredirick Maudlin MD FACS Entered By: Fredirick Maudlin on 05/01/2022 12:17:47

## 2022-05-22 ENCOUNTER — Encounter (HOSPITAL_BASED_OUTPATIENT_CLINIC_OR_DEPARTMENT_OTHER): Payer: Medicare Other | Attending: General Surgery | Admitting: General Surgery

## 2022-05-22 DIAGNOSIS — L89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, stage 3: Secondary | ICD-10-CM | POA: Diagnosis not present

## 2022-05-22 DIAGNOSIS — I509 Heart failure, unspecified: Secondary | ICD-10-CM | POA: Diagnosis not present

## 2022-05-22 DIAGNOSIS — E11622 Type 2 diabetes mellitus with other skin ulcer: Secondary | ICD-10-CM | POA: Diagnosis present

## 2022-05-22 DIAGNOSIS — I4891 Unspecified atrial fibrillation: Secondary | ICD-10-CM | POA: Insufficient documentation

## 2022-05-22 DIAGNOSIS — I110 Hypertensive heart disease with heart failure: Secondary | ICD-10-CM | POA: Insufficient documentation

## 2022-05-22 DIAGNOSIS — G8220 Paraplegia, unspecified: Secondary | ICD-10-CM | POA: Diagnosis not present

## 2022-05-22 DIAGNOSIS — Z933 Colostomy status: Secondary | ICD-10-CM | POA: Insufficient documentation

## 2022-05-22 DIAGNOSIS — E1140 Type 2 diabetes mellitus with diabetic neuropathy, unspecified: Secondary | ICD-10-CM | POA: Insufficient documentation

## 2022-05-22 NOTE — Progress Notes (Signed)
MAXIE, SLOVACEK ([id number]) [id number] Page 1 of 8 Visit Report for 05/22/2022 Chief Complaint Document Details Patient Name: Date of Service: [name] 05/22/2022 11:00 A M Medical Record Number: [id number] Patient Account Number: [id number] Date of Birth/Sex: Treating RN: [birth year] (72 y.o. M) Primary Care Provider: [name] Other Clinician: Referring Provider: Treating Provider/Extender: [name]: 9 Information Obtained from: Patient Chief Complaint Here for follow up abdominal wound present following laparotomy, colostomy. 03/19/2022: Here for pressure ulcer on left gluteus and ulcer adjacent to ileostomy Electronic Signature(s) Signed: 05/22/2022 11:32:01 AM By: Fredirick Maudlin MD FACS Entered By: Fredirick Maudlin on 05/22/2022 11:32:01 -------------------------------------------------------------------------------- Debridement Details Patient Name: Date of Service: [name]. 05/22/2022 11:00 A M Medical Record Number: [id number] Patient Account Number: [id number] Date of Birth/Sex: Treating RN: [birth year] (72 y.o. Collene Gobble Primary Care Provider: [name] Other Clinician: Referring Provider: Treating Provider/Extender: [name]: 9 Debridement Performed for Assessment: Wound #3 Left Gluteus Performed By: Physician Fredirick Maudlin, MD Debridement Type: Debridement Level of Consciousness (Pre-procedure): Awake and Alert Pre-procedure Verification/Time Out Yes - 11:20 Taken: Start Time: 11:20 Pain Control: Lidocaine 5% topical ointment T Area Debrided (L x W): otal 10 (cm) x 7.5 (cm) = 75 (cm) Tissue and other material debrided: Non-Viable, Slough, Slough Level: Non-Viable Tissue Debridement Description: Selective/Open Wound Instrument: Curette Bleeding: Minimum Hemostasis Achieved: Pressure End Time: 11:22 Procedural Pain:  0 Post Procedural Pain: 0 Response to Treatment: Procedure was tolerated well Level of Consciousness (Post- Awake and Alert procedure): Post Debridement Measurements of Total Wound Length: (cm) 10 Stage: Category/Stage III Width: (cm) 7.5 Depth: (cm) 0.2 Volume: (cm) 11.781 Character of Wound/Ulcer Post Debridement: Improved DURWOOD, DITTUS ([id number]) [id number] Page 2 of 8 Post Procedure Diagnosis Same as Pre-procedure Notes Scribed for Dr. Celine Ahr by J.Scotton Electronic Signature(s) Signed: 05/22/2022 12:45:01 PM By: Fredirick Maudlin MD FACS Signed: 05/22/2022 3:40:41 PM By: Dellie Catholic RN Entered By: Dellie Catholic on 05/22/2022 11:29:45 -------------------------------------------------------------------------------- HPI Details Patient Name: Date of Service: [name]. 05/22/2022 11:00 A M Medical Record Number: [id number] Patient Account Number: [id number] Date of Birth/Sex: Treating RN: 1950-08-06 (72 y.o. M) Primary Care Provider: [name] Other Clinician: Referring Provider: Treating Provider/Extender: [name]: 9 History of Present Illness HPI Description: On medihoney with border dressing due to stalling on collagen and concern ABD was pulling off skin. 09/11/14 Nearly healed, counseled given the thin attenuated scar skin I expect will have some recurrence wounds with slight trauma or maceration, however much improved today. cont ABD pads and medihoney. 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 He resides with his sister and is on a regular hospital  bed. The 2 of them reported that for about the last year, he has had ulceration adjacent to his ileostomy as well as on his left gluteus and upper posterior thigh. Most recent hemoglobin A1c was 6.3%. On his left gluteus, there is a stage II pressure ulcer with surrounding tissue maceration and excoriation. There is a little slough on the wound surface. Adjacent to his ileostomy, associated with his appliance, there is an ulcer that extends into the fat layer. It  is clean without any slough or necrotic tissue. 04/10/2022: The left gluteal ulcer has expanded and is quite a bit larger today. There is slough and eschar accumulation. The patient's sister reports that the ileostomy associated wound got better and so they did not go to see the outpatient ostomy nurse. They have not received the low-air-loss mattress. 05/01/2022: Apparently the low-air-loss mattress has been denied by his insurance, but they are willing to cover a gel mattress overlay. The ileostomy-area wound has contracted further, per the patient's sister; we are not managing this wound at this time. The left gluteal ulcer seems to be about the same size, but there is extensive slough on all of the open surfaces. His skin is extremely friable. 05/22/2022: The gel mattress overlay was finally delivered last week. The left gluteal ulcer has deteriorated somewhat. There is slough on all of the open surfaces and the total area of the wound is larger. It has a funky odor to it today. Electronic Signature(s) Signed: 05/22/2022 11:33:02 AM By: Fredirick Maudlin MD FACS Entered By: Fredirick Maudlin on 05/22/2022 11:33:02 -------------------------------------------------------------------------------- Physical Exam Details Patient Name: Date of Service: [name]. 05/22/2022 11:00 A M Medical Record Number: [id number] Patient Account Number: [id number] Date of Birth/Sex: Treating RN: 06-26-1950 (72 y.o. M) Primary Care Provider: [name] Other Clinician: Referring Provider: Treating Provider/Extender: [name]: [address], [place] ([id number]) [id number] Page 3 of 8 Constitutional Hypertensive, asymptomatic. . . . no acute distress. Respiratory Normal work of breathing on room air. Notes The left gluteal ulcer has deteriorated somewhat. There is slough on all of the open surfaces and the total area of the wound is larger. It has a funky odor to it today. Electronic Signature(s) Signed: 05/22/2022 11:34:09 AM By: Fredirick Maudlin MD FACS Entered By: Fredirick Maudlin on 05/22/2022 11:34:09 -------------------------------------------------------------------------------- Physician Orders Details Patient Name: Date of Service: [name]. 05/22/2022 11:00 A M Medical Record Number: [id number] Patient Account Number: [id number] Date of Birth/Sex: Treating RN: December 03, 1950 (72 y.o. Collene Gobble Primary Care Provider: [name] Other Clinician: Referring Provider: Treating Provider/Extender: [name]: 9 Verbal / Phone Orders: No Diagnosis Coding Follow-up Appointments Return appointment in 3 weeks. - *****STRETCHER*******Dr. Celine Ahr Rm 3 ***Needs extra time, STRETCHER**** Off-Loading Wound #3 Left Gluteus Turn and reposition every 2 hours Home Health Wound #3 Left Gluteus Other Home Health Orders/Instructions: - [facility] Wound #3 - Gluteus Wound Laterality: Left Cleanser: Soap and Water 1 x Per Day/30 Days Discharge Instructions: May shower and wash wound with dial antibacterial soap and water prior to dressing change. Cleanser: Wound Cleanser (Generic) 1 x Per Day/30 Days Discharge Instructions: Cleanse the wound with wound cleanser prior to applying a clean dressing using gauze sponges, not tissue or cotton balls. Peri-Wound Care: Zinc Oxide Ointment  30g tube (Generic) 1 x Per Day/30 Days Discharge Instructions: Apply Zinc Oxide to periwound with each dressing change Prim Dressing: Sorbact Superabsorbent Dressing, 8x8 (in/in) (DME) (Generic) 1 x Per Day/30 Days ary Discharge Instructions: Needs 2 for the wound Secondary Dressing: ABD Pad, 8x10 (DME) (Generic) 1 x Per Day/30 Days Discharge Instructions: Apply over primary dressing as directed. Secured With: 65M Medipore Public affairs consultant Surgical T 2x10 (in/yd) (DME) (Generic) 1 x Per Day/30 Days ape Discharge Instructions: Secure with tape as directed. Electronic Signature(s) Signed: 05/22/2022 12:45:01 PM By: Fredirick Maudlin MD FACS Signed: 05/22/2022 3:40:41 PM By: Dellie Catholic  RN Previous Signature: 05/22/2022 11:34:18 AM Version By: Fredirick Maudlin MD FACS Entered By: Dellie Catholic on 05/22/2022 11:34:48 [name] ([id number]) [id number] Page 4 of 8 -------------------------------------------------------------------------------- Problem List Details Patient Name: Date of Service: [name] 05/22/2022 11:00 A M Medical Record Number: [id number] Patient Account Number: [id number] Date of Birth/Sex: Treating RN: 04/21/1951 (72 y.o. M) Primary Care Provider: [name] Other Clinician: Referring Provider: Treating Provider/Extender: [name]: 9 Active Problems ICD-10 Encounter Code Description Active Date MDM Diagnosis [id number] Pressure ulcer of left buttock, stage 3 03/19/2022 No Yes E66.01 Morbid (severe) obesity due to excess calories 03/19/2022 No Yes G62.89 Other specified polyneuropathies 03/19/2022 No Yes Z93.2 Ileostomy status 03/19/2022 No Yes E11.622 Type 2 diabetes mellitus with other skin ulcer 03/19/2022 No Yes Inactive Problems ICD-10 Code Description Active Date Inactive Date L98.492 Non-pressure chronic ulcer of skin of other sites with fat layer exposed 03/19/2022 03/19/2022 Resolved  Problems Electronic Signature(s) Signed: 05/22/2022 11:31:42 AM By: Fredirick Maudlin MD FACS Entered By: Fredirick Maudlin on 05/22/2022 11:31:41 -------------------------------------------------------------------------------- Progress Note Details Patient Name: Date of Service: [name]. 05/22/2022 11:00 A M Medical Record Number: [id number] Patient Account Number: [id number] Date of Birth/Sex: Treating RN: 1950/12/08 (72 y.o. M) Primary Care Provider: [name] Other Clinician: Referring Provider: Treating Provider/Extender: [name]: [address], [place] ([id number]) [id number] Page 5 of 8 Subjective Chief Complaint Information obtained from Patient Here for follow up abdominal wound present following laparotomy, colostomy. 03/19/2022: Here for pressure ulcer on left gluteus and ulcer adjacent to ileostomy History of Present Illness (HPI) On medihoney with border dressing due to stalling on collagen and concern ABD was pulling off skin. 09/11/14 Nearly healed, counseled given the thin attenuated scar skin I expect will have some recurrence wounds with slight trauma or maceration, however much improved today. cont ABD pads and medihoney. 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 He resides with his sister and is on a regular hospital bed. The 2 of them reported that for about the last year, he has had ulceration adjacent to his ileostomy as well as on his left gluteus and upper posterior thigh. Most recent hemoglobin A1c was 6.3%. On his left gluteus, there is a stage II  pressure ulcer with surrounding tissue maceration and excoriation. There is a little slough on the wound surface. Adjacent to his ileostomy, associated with his appliance, there is an ulcer that extends into the fat layer. It is clean without any slough or necrotic tissue. 04/10/2022: The left gluteal ulcer has expanded and is quite a bit larger today. There is slough and eschar accumulation. The patient's sister reports that the ileostomy associated wound got better and so they did not go to see the outpatient ostomy nurse. They have not received the low-air-loss mattress. 05/01/2022: Apparently the low-air-loss mattress has been denied by his insurance, but they are willing to cover a gel mattress overlay. The ileostomy-area wound has contracted further, per the patient's sister; we are not managing this wound at this time. The left gluteal ulcer seems to be about the same size, but there is extensive slough on all of the open surfaces. His skin is extremely  friable. 05/22/2022: The gel mattress overlay was finally delivered last week. The left gluteal ulcer has deteriorated somewhat. There is slough on all of the open surfaces and the total area of the wound is larger. It has a funky odor to it today. Patient History Information obtained from Patient. Family History Cancer - Father,Mother, Diabetes - Father,Mother,Siblings, Heart Disease - [place], Hypertension - Father,Siblings, Kidney Disease - Siblings. Social History Former smoker - ended on 08/10/2013, Marital Status - Single, Alcohol Use - Rarely, Drug Use - No History, Caffeine Use - Rarely. Medical History Constitutional Symptoms (General Health) Patient has history of Congestive Heart Failure Eyes Denies history of Cataracts, Glaucoma, Optic Neuritis Ear/Nose/Mouth/Throat Denies history of Chronic sinus problems/congestion, Middle ear problems Cardiovascular Patient has history of Hypertension Endocrine Patient has history  of Type I Diabetes Denies history of Type 1 Diabetes Genitourinary Denies history of End Stage Renal Disease Neurologic Patient has history of Neuropathy, Paraplegia Psychiatric Denies history of Anorexia/bulimia, Confinement Anxiety Medical A Surgical History Notes nd Cardiovascular atrial fibrillation Musculoskeletal arthritis Objective Constitutional Hypertensive, asymptomatic. no acute distress. Vitals Time Taken: 11:15 AM, Height: 77 in, Weight: 318 lbs, BMI: 37.7, Temperature: 97.6 F, Pulse: 87 bpm, Respiratory Rate: 18 breaths/min, Blood Pressure: 166/77 mmHg. Respiratory Normal work of breathing on room air. JESTER, KLINGBERG ([id number]) [id number] Page 6 of 8 General Notes: The left gluteal ulcer has deteriorated somewhat. There is slough on all of the open surfaces and the total area of the wound is larger. It has a funky odor to it today. Integumentary (Hair, Skin) Wound #3 status is Open. Original cause of wound was Gradually Appeared. The date acquired was: 05/14/2021. The wound has been in treatment 9 weeks. The wound is located on the Left Gluteus. The wound measures 10cm length x 7.5cm width x 0.2cm depth; 58.905cm^2 area and 11.781cm^3 volume. There is Fat Layer (Subcutaneous Tissue) exposed. There is no tunneling or undermining noted. There is a medium amount of serosanguineous drainage noted. The wound margin is distinct with the outline attached to the wound base. There is medium (34-66%) red, friable, hyper - granulation within the wound bed. There is a medium (34-66%) amount of necrotic tissue within the wound bed including Adherent Slough. The periwound skin appearance exhibited: Excoriation, Dry/Scaly, Erythema. The periwound skin appearance did not exhibit: Crepitus, Rash, Maceration. The surrounding wound skin color is noted with erythema. Periwound temperature was noted as No Abnormality. 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 There was a Selective/Open Wound Non-Viable Tissue Debridement with a total area of 75 sq cm performed by Fredirick Maudlin, MD. With the following instrument(s): Curette to remove Non-Viable tissue/material. Material removed includes [facility] after achieving pain control using Lidocaine 5% topical ointment. No specimens were taken. A time out was conducted at 11:20, prior to the start of the procedure. A Minimum amount of bleeding was controlled with Pressure. The procedure was tolerated well with a pain level of 0 throughout and a pain level of 0 following the procedure. Post Debridement Measurements: 10cm length x 7.5cm width x 0.2cm depth; 11.781cm^3 volume. Post debridement Stage noted as Category/Stage III. Character of Wound/Ulcer Post Debridement is improved. Post procedure Diagnosis Wound #3: Same as Pre-Procedure General Notes: Scribed for Dr. Celine Ahr by J.Scotton.  Plan 05/22/2022: The left gluteal ulcer has deteriorated somewhat. There is slough on all of the open surfaces and the total area of the wound is larger. It has a funky odor to it today. I used a curette to debride the slough from the wound. I do not think we are accomplishing our desired goals with silver alginate, I am going to try Sorbact to see if we can make some forward progress. I am also hopeful that moving him onto the gel mattress overlay will improve his ability to offload. Unfortunately, his insurance would not cover a level 2 surface. Follow-up in 3 weeks. Electronic Signature(s) Signed: 05/22/2022 11:41:29 AM By: Fredirick Maudlin MD FACS Entered By: Fredirick Maudlin on 05/22/2022  11:41:29 -------------------------------------------------------------------------------- HxROS Details Patient Name: Date of Service: [name]. 05/22/2022 11:00 A M Medical Record Number: [id number] Patient Account Number: [id number] Date of Birth/Sex: Treating RN: 1950/07/28 (72 y.o. M) Primary Care Provider: [name] Other Clinician: Referring Provider: Treating Provider/Extender: [name]: [address] [place] ([id number]) [id number] Page 7 of 8 Information Obtained From Patient Constitutional Symptoms (General Health) Medical History: Positive for: Congestive Heart Failure Eyes Medical History: Negative for: Cataracts; Glaucoma; Optic Neuritis Ear/Nose/Mouth/Throat Medical History: Negative for: Chronic sinus problems/congestion; Middle ear problems Cardiovascular Medical History: Positive for: Hypertension Past Medical History Notes: atrial fibrillation Endocrine Medical History: Positive for: Type I Diabetes Negative for: Type 1 Diabetes Time with diabetes: less then 10 Treated with: Oral agents Genitourinary Medical History: Negative for: End Stage Renal Disease Musculoskeletal Medical History: Past Medical History Notes: arthritis Neurologic Medical History: Positive for: Neuropathy; Paraplegia Psychiatric Medical History: Negative for: Anorexia/bulimia; Confinement Anxiety Immunizations Pneumococcal Vaccine: Received Pneumococcal Vaccination: Yes Received Pneumococcal Vaccination On or After 60th Birthday: Yes Implantable Devices None Family and Social History Cancer: Yes - Father,Mother; Diabetes: Yes - Father,Mother,Siblings; Heart Disease: Yes - Mother,Siblings; Hypertension: Yes - Father,Siblings; Kidney Disease: Yes - Siblings; Former smoker - ended on 08/10/2013; Marital Status - Single; Alcohol Use: Rarely; Drug Use: No History; Caffeine Use: Rarely; Financial Concerns:  No; Food, Clothing or Shelter Needs: No; Support System Lacking: No; Transportation Concerns: No Electronic Signature(s) Signed: 05/22/2022 12:45:01 PM By: Fredirick Maudlin MD FACS Entered By: Fredirick Maudlin on 05/22/2022 11:33:09 [name] ([id number]) [id number] Page 8 of 8 -------------------------------------------------------------------------------- SuperBill Details Patient Name: Date of Service: [name] 05/22/2022 Medical Record Number: [id number] Patient Account Number: [id number] Date of Birth/Sex: Treating RN: 09/11/1950 (72 y.o. M) Primary Care Provider: [name] Other Clinician: Referring Provider: Treating Provider/Extender: [name]: 9 Diagnosis Coding ICD-10 Codes Code Description [phone] Pressure ulcer of left buttock, stage 3 E66.01 Morbid (severe) obesity due to excess calories G62.89 Other specified polyneuropathies Z93.2 Ileostomy status E11.622 Type 2 diabetes mellitus with other skin ulcer Facility Procedures : CPT4 Code: [id number] Description: 15176 - DEBRIDE WOUND 1ST 20 SQ CM OR < ICD-10 Diagnosis Description L89.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, stage 3 Modifier: Quantity: 1 : CPT4 Code: 16073710 Description: 62694 - DEBRIDE WOUND EA ADDL 20 SQ CM ICD-10 Diagnosis Description L89.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, stage 3 Modifier: Quantity: 3 Physician Procedures : CPT4 Code Description Modifier [id number] 35009 - WC PHYS LEVEL 3 - EST PT 25 ICD-10 Diagnosis Description L89.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, stage 3 G62.89 Other specified polyneuropathies E11.622 Type 2 diabetes mellitus with other skin ulcer  E66.01 Morbid (severe) obesity due to excess calories Quantity: 1 : [id number]  97597 - WC PHYS DEBR WO ANESTH 20 SQ CM ICD-10 Diagnosis Description L89.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, stage 3 Quantity: 1 : [id number] 28003 - WC PHYS DEBR WO ANESTH EA ADD 20  CM ICD-10 Diagnosis Description L89.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, stage 3 Quantity: 3 Electronic Signature(s) Signed: 05/22/2022 11:41:51 AM By: Fredirick Maudlin MD FACS Entered By: Fredirick Maudlin on 05/22/2022 11:41:51

## 2022-05-23 NOTE — Progress Notes (Signed)
[name], [name] ([id number]) [id number] Page 1 of 7 Visit Report for 05/22/2022 Arrival Information Details Patient Name: Date of Service: [name] 05/22/2022 11:00 A M Medical Record Number: [id number] Patient Account Number: [id number] Date of Birth/Sex: Treating RN: [birth year] (72 y.o. Collene Gobble Primary Care Kariya Lavergne: [name] Other Clinician: Referring Maryum Batterson: Treating Thai Burgueno/Extender: [name]: 9 Visit Information History Since Last Visit Added or deleted any medications: No Patient Arrived: Stretcher Any new allergies or adverse reactions: No Arrival Time: 11:07 Had a fall or experienced change in No Accompanied By: sister activities of daily living that may affect Transfer Assistance: Manual risk of falls: Patient Identification Verified: Yes Signs or symptoms of abuse/neglect since last visito No Patient Requires Transmission-Based Precautions: No Hospitalized since last visit: No Implantable device outside of the clinic excluding No cellular tissue based products placed in the center since last visit: Pain Present Now: Yes Electronic Signature(s) Signed: 05/22/2022 3:40:41 PM By: Dellie Catholic RN Entered By: Dellie Catholic on 05/22/2022 11:13:33 -------------------------------------------------------------------------------- Encounter Discharge Information Details Patient Name: Date of Service: [name]. 05/22/2022 11:00 A M Medical Record Number: [id number] Patient Account Number: [id number] Date of Birth/Sex: Treating RN: [birth year] (72 y.o. Collene Gobble Primary Care Daniele Dillow: [name] Other Clinician: Referring Alika Saladin: Treating Xitlali Kastens/Extender: [name]: 9 Encounter Discharge Information Items Post Procedure Vitals Discharge Condition: Stable Temperature (F): 97.6 Ambulatory Status: Wheelchair Pulse (bpm):  87 Discharge Destination: Home Respiratory Rate (breaths/min): 18 Transportation: Ambulance Blood Pressure (mmHg): 166/77 Accompanied By: sister Schedule Follow-up Appointment: Yes Clinical Summary of Care: Patient Declined Electronic Signature(s) Signed: 05/22/2022 3:40:41 PM By: Dellie Catholic RN Entered By: Dellie Catholic on 05/22/2022 15:25:34 [name] ([id number]) [id number] Page 2 of 7 -------------------------------------------------------------------------------- Lower Extremity Assessment Details Patient Name: Date of Service: [name] 05/22/2022 11:00 A M Medical Record Number: [id number] Patient Account Number: [id number] Date of Birth/Sex: Treating RN: [birth year] (72 y.o. Collene Gobble Primary Care Zyaire Mccleod: [name] Other Clinician: Referring Velda Wendt: Treating Koleton Duchemin/Extender: [name]: 9 Electronic Signature(s) Signed: 05/22/2022 3:40:41 PM By: Dellie Catholic RN Entered By: Dellie Catholic on 05/22/2022 11:13:52 -------------------------------------------------------------------------------- Multi Wound Chart Details Patient Name: Date of Service: [name]. 05/22/2022 11:00 A M Medical Record Number: [id number] Patient Account Number: [id number] Date of Birth/Sex: Treating RN: 1950/12/20 (72 y.o. M) Primary Care Deni Berti: [name] Other Clinician: Referring Briston Lax: Treating Yuvan Medinger/Extender: [name]: 9 Vital Signs Height(in): 77 Pulse(bpm): 87 Weight(lbs): 318 Blood Pressure(mmHg): 166/77 Body Mass Index(BMI): 37.7 Temperature(F): 97.6 Respiratory Rate(breaths/min): 18 [3:Photos:] [N/A:N/A] Left Gluteus N/A N/A Wound Location: Gradually Appeared N/A N/A Wounding Event: Pressure Ulcer N/A N/A Primary Etiology: Congestive Heart Failure, N/A N/A Comorbid History: Hypertension, Type I  Diabetes, Neuropathy, Paraplegia 05/14/2021 N/A N/A Date Acquired: 9 N/A N/A Weeks of Treatment: Open N/A N/A Wound Status: No N/A N/A Wound Recurrence: 10x7.5x0.2 N/A N/A Measurements L x W x D (cm) 58.905 N/A N/A A (cm) : rea 11.781 N/A N/A Volume (cm) : 64.70% N/A N/A % Reduction in A rea: 29.40% N/A N/A % Reduction in Volume: Category/Stage III N/A N/A Classification: Medium N/A N/A Exudate A mount: Serosanguineous N/A N/A Exudate Type: red, brown N/A N/A Exudate Color: Distinct, outline attached N/A N/A Wound Margin: Medium (34-66%) N/A N/A Granulation A mount: Red, Hyper-granulation, Friable N/A N/A Granulation QualityUSAMA, [name] ([id number]) [id number] Page  3 of 7 Medium (34-66%) N/A N/A Necrotic Amount: Fat Layer (Subcutaneous Tissue): Yes N/A N/A Exposed Structures: Fascia: No Tendon: No Muscle: No Joint: No Bone: No None N/A N/A Epithelialization: Debridement - Selective/Open Wound N/A N/A Debridement: Pre-procedure Verification/Time Out 11:20 N/A N/A Taken: Lidocaine 5% topical ointment N/A N/A Pain Control: Slough N/A N/A Tissue Debrided: Non-Viable Tissue N/A N/A Level: 75 N/A N/A Debridement A (sq cm): rea Curette N/A N/A Instrument: Minimum N/A N/A Bleeding: Pressure N/A N/A Hemostasis A chieved: 0 N/A N/A Procedural Pain: 0 N/A N/A Post Procedural Pain: Procedure was tolerated well N/A N/A Debridement Treatment Response: 10x7.5x0.2 N/A N/A Post Debridement Measurements L x W x D (cm) 11.781 N/A N/A Post Debridement Volume: (cm) Category/Stage III N/A N/A Post Debridement Stage: Excoriation: Yes N/A N/A Periwound Skin Texture: Crepitus: No Rash: No Dry/Scaly: Yes N/A N/A Periwound Skin Moisture: Maceration: No Erythema: Yes N/A N/A Periwound Skin Color: No Abnormality N/A N/A Temperature: Debridement N/A N/A Procedures Performed: Treatment Notes Electronic Signature(s) Signed:  05/22/2022 11:31:49 AM By: Fredirick Maudlin MD FACS Entered By: Fredirick Maudlin on 05/22/2022 11:31:49 -------------------------------------------------------------------------------- Multi-Disciplinary Care Plan Details Patient Name: Date of Service: [name]. 05/22/2022 11:00 A M Medical Record Number: [id number] Patient Account Number: [id number] Date of Birth/Sex: Treating RN: July 09, 1950 (72 y.o. Collene Gobble Primary Care Lamekia Nolden: [name] Other Clinician: Referring Woody Kronberg: Treating Saatvik Thielman/Extender: [name]: 9 Active Inactive Nutrition Nursing Diagnoses: Potential for alteratiion in Nutrition/Potential for imbalanced nutrition Goals: Patient/caregiver verbalizes understanding of need to maintain therapeutic glucose control per primary care physician Date Initiated: 03/19/2022 Target Resolution Date: 11/08/2022 Goal Status: Active Interventions: Provide education on elevated blood sugars and impact on wound healing Treatment Activities: Dietary management education, guidance and counseling : 03/19/2022 Notes: [name], [name] ([id number]) [phone].pdf Page 4 of 7 Orientation to the Wound Care Program Nursing Diagnoses: Knowledge deficit related to the wound healing center program Goals: Patient/caregiver will verbalize understanding of the [place] [facility] Date Initiated: 03/19/2022 Target Resolution Date: 11/08/2022 Goal Status: Active Interventions: Provide education on orientation to the wound center Notes: Wound/Skin Impairment Nursing Diagnoses: Knowledge deficit related to ulceration/compromised skin integrity Goals: Ulcer/skin breakdown will have a volume reduction of 30% by week 4 Date Initiated: 03/19/2022 Target Resolution Date: 11/08/2022 Goal Status: Active Interventions: Assess ulceration(s) every visit Provide education on ulcer and skin care Treatment  Activities: Skin care regimen initiated : 03/19/2022 Notes: Electronic Signature(s) Signed: 05/22/2022 3:40:41 PM By: Dellie Catholic RN Entered By: Dellie Catholic on 05/22/2022 11:17:43 -------------------------------------------------------------------------------- Pain Assessment Details Patient Name: Date of Service: [name] 05/22/2022 11:00 A M Medical Record Number: [id number] Patient Account Number: [id number] Date of Birth/Sex: Treating RN: 1951-04-23 (72 y.o. [name] Primary Care Kimo Bancroft: [name] Other Clinician: Referring Kazimierz Springborn: Treating Meri Pelot/Extender: [name]: 9 Active Problems Location of Pain Severity and Description of Pain Patient Has Paino Yes Site Locations Pain Location: Pain in Ulcers Duration of the Pain. Constant / Intermittento Constant Rate the pain. Current Pain Level: [address] ([id number]) [id number] Page 5 of 7 Character of Pain Describe the Pain: Burning Pain Management and Medication Current Pain Management: Medication: Yes Electronic Signature(s) Signed: 05/22/2022 3:59:24 PM By: Adline Peals Entered By: Adline Peals on 05/22/2022 11:13:33 -------------------------------------------------------------------------------- Patient/Caregiver Education Details Patient Name: Date of Service: [name] 1/11/2024andnbsp11:00 [place] Record Number: [id number] Patient Account Number: [id number] Date of Birth/Gender: Treating  RN: [birth year] (72 y.o. Collene Gobble Primary Care Physician: [name] Other Clinician: Referring Physician: Treating Physician/Extender: [name]: 9 Education Assessment Education Provided To: Patient and Caregiver Education Topics Provided Wound/Skin Impairment: Methods: Explain/Verbal Responses: Return demonstration correctly Electronic  Signature(s) Signed: 05/22/2022 3:40:41 PM By: Dellie Catholic RN Entered By: Dellie Catholic on 05/22/2022 11:18:15 -------------------------------------------------------------------------------- Wound Assessment Details Patient Name: Date of Service: [name] 05/22/2022 11:00 A M Medical Record Number: [id number] Patient Account Number: [id number] Date of Birth/Sex: Treating RN: 06/27/1950 (72 y.o. [name] Primary Care Ajai Terhaar: [name] Other Clinician: Referring Lashia Niese: Treating Nusayba Cadenas/Extender: [name]: 9 Wound Status Wound Number: 3 Primary Pressure Ulcer Etiology: Wound Location: Left Gluteus Wound Open Wounding Event: Gradually Appeared Status: Date Acquired: 05/14/2021 Comorbid Congestive Heart Failure, Hypertension, Type I Diabetes, Weeks Of Treatment: 9 History: Neuropathy, Paraplegia Clustered Wound: No Photos [name], [name] ([id number]) [id number] Page 6 of 7 Wound Measurements Length: (cm) 10 Width: (cm) 7.5 Depth: (cm) 0.2 Area: (cm) 58.905 Volume: (cm) 11.781 % Reduction in Area: 64.7% % Reduction in Volume: 29.4% Epithelialization: None Tunneling: No Undermining: No Wound Description Classification: Category/Stage III Wound Margin: Distinct, outline attached Exudate Amount: Medium Exudate Type: Serosanguineous Exudate Color: red, brown Foul Odor After Cleansing: No Slough/Fibrino Yes Wound Bed Granulation Amount: Medium (34-66%) Exposed Structure Granulation Quality: Red, Hyper-granulation, Friable Fascia Exposed: No Necrotic Amount: Medium (34-66%) Fat Layer (Subcutaneous Tissue) Exposed: Yes Necrotic Quality: Adherent Slough Tendon Exposed: No Muscle Exposed: No Joint Exposed: No Bone Exposed: No Periwound Skin Texture Texture Color No Abnormalities Noted: No No Abnormalities Noted: No Crepitus: No Erythema: Yes Excoriation: Yes  Temperature / Pain Rash: No Temperature: No Abnormality Moisture No Abnormalities Noted: No Dry / Scaly: Yes Maceration: No Treatment Notes Wound #3 (Gluteus) Wound Laterality: Left Cleanser Soap and Water Discharge Instruction: May shower and wash wound with dial antibacterial soap and water prior to dressing change. Wound Cleanser Discharge Instruction: Cleanse the wound with wound cleanser prior to applying a clean dressing using gauze sponges, not tissue or cotton balls. Peri-Wound Care Zinc Oxide Ointment 30g tube Discharge Instruction: Apply Zinc Oxide to periwound with each dressing change Topical Primary Dressing Sorbact Superabsorbent Dressing, 8x8 (in/in) Discharge Instruction: Needs 2 for the wound Secondary Dressing ABD Pad, 8x10 Discharge Instruction: Apply over primary dressing as directed. Secured With [employer] Surgical T 2x10 (in/yd) ape Discharge Instruction: Secure with tape as directed. [name], [name] ([id number]) [id number] Page 7 of 7 Compression Wrap Compression Stockings Add-Ons Electronic Signature(s) Signed: 05/22/2022 3:40:41 PM By: Dellie Catholic RN Signed: 05/22/2022 3:59:24 PM By: Adline Peals Entered By: Dellie Catholic on 05/22/2022 11:21:15 -------------------------------------------------------------------------------- Vitals Details Patient Name: Date of Service: [name]. 05/22/2022 11:00 A M Medical Record Number: [id number] Patient Account Number: [id number] Date of Birth/Sex: Treating RN: 12-02-1950 (72 y.o. Collene Gobble Primary Care Clois Treanor: [name] Other Clinician: Referring Cierra Rothgeb: Treating Clerence Gubser/Extender: [name]: 9 Vital Signs Time Taken: 11:15 Temperature (F): 97.6 Height (in): 77 Pulse (bpm): 87 Weight (lbs): 318 Respiratory Rate (breaths/min): 18 Body Mass Index (BMI): 37.7 Blood Pressure (mmHg):  166/77 Reference Range: 80 - 120 mg / dl Electronic Signature(s) Signed: 05/22/2022 3:40:41 PM By: Dellie Catholic RN Entered By: Dellie Catholic on 05/22/2022 11:15:54

## 2022-05-27 ENCOUNTER — Ambulatory Visit: Payer: Medicare Other | Attending: Internal Medicine | Admitting: Internal Medicine

## 2022-05-27 ENCOUNTER — Encounter: Payer: Self-pay | Admitting: Internal Medicine

## 2022-05-27 VITALS — BP 141/74 | HR 83

## 2022-05-27 DIAGNOSIS — Z7901 Long term (current) use of anticoagulants: Secondary | ICD-10-CM

## 2022-05-27 DIAGNOSIS — E782 Mixed hyperlipidemia: Secondary | ICD-10-CM | POA: Diagnosis not present

## 2022-05-27 DIAGNOSIS — I480 Paroxysmal atrial fibrillation: Secondary | ICD-10-CM

## 2022-05-27 NOTE — Progress Notes (Signed)
Virtual Visit via Video Note   This visit type was conducted due to national recommendations for restrictions regarding the COVID-19 Pandemic (e.g. social distancing) in an effort to limit this patient's exposure and mitigate transmission in our community.  Due to his co-morbid illnesses, this patient is at least at moderate risk for complications without adequate follow up.  This format is felt to be most appropriate for this patient at this time.  All issues noted in this document were discussed and addressed.  A limited physical exam was performed with this format.  Please refer to the patient's chart for his consent to telehealth for [facility].   Evaluation Performed:  Video visit  Date:  05/27/2022   ID:  [name] 09-08-1950, MRN [id number]  Patient Location:  [place] [address]  Provider location:   [address], [place], [place] [address]  PCP:  [name], MD  Cardiologist:  Pixie Casino, MD Electrophysiologist:  None   Chief Complaint: Follow-up  History of Present Illness:    [name] is a 72 y.o. male who presents via audio/video conferencing for a telehealth visit today.  [name] was seen today via Doximity video visit. He is chronically bedbound due to neuropathy. He has had problems with chronic leg edema which has improved. Recently he has noted an irregular heartbeat and an EKG by a home health doctor seems to have confirmed afib - he has known PAF on Pradaxa. Labs stable recently - he was noted to have elevated triglycerides and changed to a fibrate and fish oil. He denies chest pain, DOE or symptomatic atrial fibrillation.  11/21/2019  [name] was seen today for video visit.  This works very well to him because he is chronically bedbound due to neuropathy.  He has persistent lower extremity edema and atrial fibrillation which seems more longstanding persistent.  He is on Pradaxa.  In November he had  a UTI and was noted be tachycardic however that resolved again after treatment.  He is apparently prone to UTIs and has had several recently.  He currently has a UTI of her heart rate was 85 today and blood pressure is well controlled.  He continues to have care with doctors making house calls who does labs every few months.  The family reports no worsening anemia and normal renal function.  He denies any bleeding difficulty on Pradaxa.  His triglycerides have been elevated in 3-400 range however after being started on fish oils and fenofibrate are now down in the 200s.  12/27/2020  [name] is seen today in follow-up.  Overall he seems to be fairly stable.  Back in January he was in the ER with pretty extreme pain in his leg.  He was also noted to have a UTI which probably set that off.  He received pain medicine and antibiotic treatments.  Since then he has been doing pretty well.  He is probably in persistent A. fib.  Is not had an EKG since 2020 however his blood pressure cuff shows irregularities.  Blood pressure is well controlled.  PCP had checked his cholesterol recently which showed his numbers to be generally in range except for triglycerides however they were somewhat lower according to his wife.  05/27/2022  [name] is doing well.  He is essentially bedbound.  He is undergoing some wound care for sacral decubitus.  He has not had any recent hospitalizations.  He had lipids  performed in October which appear to be pretty good with an LDL particle number of 526.  Triglycerides were not tested but he remains on fish oil in addition to low-dose atorvastatin.  He is also on Pradaxa for history of DVT and A-fib.  He denies any symptomatic A-fib.  He has had no chest pain or worsening shortness of breath.   Prior CV studies:   The following studies were reviewed today:  Chart reviewed  PMHx:  Past Medical History:  Diagnosis Date   Arthritis    Atrial fibrillation ([place])    a. Dx 05/2014 in  setting of seizure, PNA. S/p TEE/DCCV.   Bowel perforation ([place])    a. transvere and R colectomy and ileostomy in 12/25/13.   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   Psoriasis    Seizures (HCC)    Venous stasis    Weakness of both legs     Past Surgical History:  Procedure Laterality Date   ANKLE FRACTURE SURGERY  09/04/13   CARDIOVERSION N/A 05/24/2014   Procedure: CARDIOVERSION;  Surgeon: Josue Hector, MD;  Location: [facility];  Service: Cardiovascular;  Laterality: N/A;   COLONOSCOPY  03/26/2012   Procedure: COLONOSCOPY;  Surgeon: Beryle Beams, MD;  Location: [facility];  Service: Endoscopy;  Laterality: N/A;   COLOSTOMY  12-25-13   Ruptured colon   FLEXIBLE SIGMOIDOSCOPY N/A 12/09/2013   Procedure: FLEXIBLE SIGMOIDOSCOPY;  Surgeon: Beryle Beams, MD;  Location: [facility];  Service: Endoscopy;  Laterality: N/A;   LAPAROTOMY N/A 12/25/2013   Procedure: EXPLORATORY LAPAROTOMY/Transverse and right coloectomy/ end  ileostomy and mucus  fistula;  Surgeon: Ralene Ok, MD;  Location: [facility];  Service: General;  Laterality: N/A;   TEE WITHOUT CARDIOVERSION N/A 05/24/2014   Procedure: TRANSESOPHAGEAL ECHOCARDIOGRAM (TEE);  Surgeon: Josue Hector, MD;  Location[facility];  Service: Cardiovascular;  Laterality: N/A;   TONSILLECTOMY     as child    FAMHx:  Family History  Problem Relation Age of Onset   Vision loss Father    Hypertension Father    Diabetes Father    Heart disease Father    Cancer Father        colon   Diabetes Mother    Hypertension Mother    Heart disease  Mother    Dementia Mother    Cancer Mother        breast ca   Diabetes Sister    Hyperlipidemia Sister    Hypertension Sister    Diabetes Brother    Kidney disease Brother    Heart disease Brother     SOCHx:   reports that he quit smoking about 8 years ago. His smoking use included pipe. He has never used smokeless tobacco. He reports that he does not drink alcohol and does not use drugs.  ALLERGIES:  Allergies  Allergen Reactions   Penicillins Other (See Comments)    Pt has no personal reaction to this med, but his mother/brother do. Has patient had a  PCN reaction causing immediate rash, facial/tongue/throat swelling, SOB or lightheadedness with hypotension: No Has patient had a PCN reaction causing severe rash involving mucus membranes or skin necrosis: No Has patient had a PCN reaction that required hospitalization No Has patient had a PCN reaction occurring within the last 10 years: No If all of the above answers are "NO", then may proceed with Cephalosporin    MEDS:  Current Meds  Medication Sig   acetaminophen (TYLENOL) 500 MG tablet Take 500-1,000 mg by mouth every 6 (six) hours as needed for mild pain, moderate pain, fever or headache.   Alpha Lipoic Acid 200 MG CAPS Take 200 mg by mouth 2 (two) times daily.   atorvastatin (LIPITOR) 10 MG tablet Take 10 mg by mouth at bedtime.   Calcium Carbonate-Vitamin D 600-400 MG-UNIT tablet Take 1 tablet by mouth daily.   Cholecalciferol (VITAMIN D3 PO) Take 1 capsule by mouth daily.   dabigatran (PRADAXA) 150 MG CAPS capsule TAKE 1 CAPSULE BY MOUTH TWICE A DAY   DILT-XR 240 MG 24 hr capsule Take 240 mg by mouth daily.   doxepin (SINEQUAN) 75 MG capsule Take 150 mg by mouth at bedtime.    DULoxetine (CYMBALTA) 60 MG capsule Take 60 mg by mouth daily.   fenofibrate 160 MG tablet Take 160 mg by mouth daily.   Ferrous Sulfate (IRON PO) Take 1 tablet by mouth daily.   gabapentin (NEURONTIN) 600 MG tablet 600 mg tid po.   glipiZIDE  (GLUCOTROL XL) 2.5 MG 24 hr tablet Take 2.5 mg by mouth in the morning and at bedtime.    Lactobacillus (PROBIOTIC ACIDOPHILUS PO) Take 1 tablet by mouth daily.   lamoTRIgine (LAMICTAL) 150 MG tablet Take 1 tablet (150 mg total) by mouth 2 (two) times daily.   levETIRAcetam (KEPPRA) 750 MG tablet Take 2 tablets (1,500 mg total) by mouth 2 (two) times daily. Please call and make overdue appt for further refills. 1st attempt   losartan (COZAAR) 25 MG tablet Take 1 tablet (25 mg total) by mouth 2 (two) times daily. SCHEDULE OFFICE VISIT FOR FUTURE REFILLS.   metFORMIN (GLUCOPHAGE) 1000 MG tablet Take 1,000 mg by mouth 2 (two) times daily.   Multiple Vitamin (MULTIVITAMIN WITH MINERALS) TABS Take 1 tablet by mouth daily.   naloxone (NARCAN) nasal spray 4 mg/0.1 mL Place 1 spray into the nose once.   Omega-3 Fatty Acids (FISH OIL) 1000 MG CAPS Take 1,000 mg by mouth 2 (two) times a day.   oxyCODONE-acetaminophen (PERCOCET) 10-325 MG tablet Take 1 tablet by mouth 5 (five) times daily as needed.     ROS: Pertinent items noted in HPI and remainder of comprehensive ROS otherwise negative.  Labs/Other Tests and Data Reviewed:    Recent Labs: 01/05/2022: ALT 12 01/06/2022: Magnesium 1.9 01/07/2022: BUN 12; Creatinine, Ser 0.61; Hemoglobin 13.3; Platelets 289; Potassium 3.8; Sodium 141   Recent Lipid Panel Lab Results  Component Value Date/Time   CHOL 136 10/17/2013 12:00 AM   TRIG 65 10/17/2013 12:00 AM   HDL 45 10/17/2013 12:00 AM   LDLCALC 78 10/17/2013 12:00 AM    Wt Readings from Last 3 Encounters:  01/10/22 (!) 319 lb 12.8 oz (145.1 kg)  06/14/15 300 lb (136.1 kg)  04/15/15 300 lb (136.1 kg)     Exam:    Vital Signs:  BP (!) 141/74   Pulse 83   SpO2 92%    General appearance: alert, no distress, and moderately obese Lungs: no visual respiratory difficulty Extremities:  extremities normal, atraumatic, no cyanosis or edema Skin: Skin color, texture, turgor normal. No rashes or  lesions Neurologic: Grossly normal  ASSESSMENT & PLAN:    PAF-maintaining sinus, CHADSVASC score of 3 History of DVT on Pradaxa Nonambulatory Dyslipidemia -specifically high triglycerides Bilateral lower extremity venous stasis edema  [name] seems to be stable but is essentially bedbound.  He has had an issue with a sacral decubitus which is being treated by the wound care center and his wife.  He has no awareness of atrial fibrillation.  He remains on Pradaxa without bleeding issues.  He has had no recurrent clotting.  His cholesterol seems to be low although triglycerides were not recently checked.  He denies any issues with worsening swelling.  No changes to his meds today.  Patient Risk:   After full review of this patients clinical status, I feel that they are at least moderate risk at this time.  Time:   Today, I have spent 25 minutes with the patient with telehealth technology discussing PAF, HTN, dyslipidemia, edema.     Medication Adjustments/Labs and Tests Ordered: Current medicines are reviewed at length with the patient today.  Concerns regarding medicines are outlined above.   Tests Ordered: No orders of the defined types were placed in this encounter.    Medication Changes: No orders of the defined types were placed in this encounter.    Disposition:  in 1 year(s)  Pixie Casino, MD, [facility], [place] Director of the Advanced Lipid Disorders &  Cardiovascular Risk Reduction Clinic Diplomate of the American Board of Clinical Lipidology Attending Cardiologist  Direct Dial: [phone]  Fax: [phone]  Website:  [email or web address]  Pixie Casino, MD  05/27/2022 9:14 AM

## 2022-05-27 NOTE — Patient Instructions (Signed)
Medication Instructions:  NO CHANGES    Follow-Up: At [facility], you and your health needs are our priority.  As part of our continuing mission to provide you with exceptional heart care, we have created designated Provider Care Teams.  These Care Teams include your primary Cardiologist (physician) and Advanced Practice Providers (APPs -  Physician Assistants and Nurse Practitioners) who all work together to provide you with the care you need, when you need it.  We recommend signing up for the patient portal called "MyChart".  Sign up information is provided on this After Visit Summary.  MyChart is used to connect with patients for Virtual Visits (Telemedicine).  Patients are able to view lab/test results, encounter notes, upcoming appointments, etc.  Non-urgent messages can be sent to your provider as well.   To learn more about what you can do with MyChart, go to [email or web address].    Your next appointment:   12 month(s)  Provider:   Pixie Casino, MD

## 2022-06-12 ENCOUNTER — Ambulatory Visit (HOSPITAL_BASED_OUTPATIENT_CLINIC_OR_DEPARTMENT_OTHER): Payer: Medicare Other | Admitting: General Surgery

## 2022-06-13 ENCOUNTER — Encounter (HOSPITAL_BASED_OUTPATIENT_CLINIC_OR_DEPARTMENT_OTHER): Payer: Medicare Other | Attending: General Surgery | Admitting: General Surgery

## 2022-06-13 DIAGNOSIS — Z6837 Body mass index (BMI) 37.0-37.9, adult: Secondary | ICD-10-CM | POA: Diagnosis not present

## 2022-06-13 DIAGNOSIS — Z932 Ileostomy status: Secondary | ICD-10-CM | POA: Insufficient documentation

## 2022-06-13 DIAGNOSIS — G8220 Paraplegia, unspecified: Secondary | ICD-10-CM | POA: Diagnosis not present

## 2022-06-13 DIAGNOSIS — E11622 Type 2 diabetes mellitus with other skin ulcer: Secondary | ICD-10-CM | POA: Insufficient documentation

## 2022-06-13 DIAGNOSIS — G6289 Other specified polyneuropathies: Secondary | ICD-10-CM | POA: Insufficient documentation

## 2022-06-13 DIAGNOSIS — Z87891 Personal history of nicotine dependence: Secondary | ICD-10-CM | POA: Insufficient documentation

## 2022-06-13 DIAGNOSIS — L89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, stage 3: Secondary | ICD-10-CM | POA: Insufficient documentation

## 2022-06-13 NOTE — Progress Notes (Signed)
EDKER, PUNT ([id number]) [id number] Page 1 of 7 Visit Report for 06/13/2022 Arrival Information Details Patient Name: Date of Service: [name] 06/13/2022 10:30 A M Medical Record Number: [id number] Patient Account Number: [id number] Date of Birth/Sex: Treating RN: Jun 03, 1950 (72 y.o. [name] Primary Care Roselin Wiemann: [name] Other Clinician: Referring Yarima Penman: Treating Daemyn Gariepy/Extender: [name]: 12 Visit Information History Since Last Visit Added or deleted any medications: No Patient Arrived: Stretcher Any new allergies or adverse reactions: No Arrival Time: 10:38 Had a fall or experienced change in No Accompanied By: sister activities of daily living that may affect Transfer Assistance: Stretcher risk of falls: Patient Identification Verified: Yes Signs or symptoms of abuse/neglect since last visito No Secondary Verification Process Completed: Yes Hospitalized since last visit: No Patient Requires Transmission-Based Precautions: No Implantable device outside of the clinic excluding No cellular tissue based products placed in the center since last visit: Has Dressing in Place as Prescribed: Yes Pain Present Now: Yes Electronic Signature(s) Signed: 06/13/2022 4:41:03 PM By: Adline Peals Entered By: Adline Peals on 06/13/2022 10:38:24 -------------------------------------------------------------------------------- Encounter Discharge Information Details Patient Name: Date of Service: [name]. 06/13/2022 10:30 A M Medical Record Number: [id number] Patient Account Number: [id number] Date of Birth/Sex: Treating RN: 05-11-1951 (72 y.o. [name] Primary Care Shay Jhaveri: [name] Other Clinician: Referring Rizwan Kuyper: Treating Kamile Fassler/Extender: [name]: 12 Encounter Discharge Information Items Post Procedure  Vitals Discharge Condition: Stable Temperature (F): 98.1 Ambulatory Status: Stretcher Pulse (bpm): 80 Discharge Destination: Home Respiratory Rate (breaths/min): 20 Transportation: Private Auto Blood Pressure (mmHg): 160/89 Accompanied By: self Schedule Follow-up Appointment: Yes Clinical Summary of Care: Electronic Signature(s) Signed: 06/13/2022 11:10:46 AM By: [name] Entered By: Blanche East on 06/13/2022 11:10:46 [name] ([id number]) [id number] Page 2 of 7 -------------------------------------------------------------------------------- Lower Extremity Assessment Details Patient Name: Date of Service: [name] 06/13/2022 10:30 A M Medical Record Number: [id number] Patient Account Number: [id number] Date of Birth/Sex: Treating RN: 02-May-1951 (71 y.o. [name] Primary Care [name]: [name] Other Clinician: Referring [name]: Treating [name]/Extender: [name]: 12 Electronic Signature(s) Signed: 06/13/2022 4:41:03 PM By: Sabas Sous By: Adline Peals on 06/13/2022 10:39:40 -------------------------------------------------------------------------------- Multi Wound Chart Details Patient Name: Date of Service: [name]. 06/13/2022 10:30 A M Medical Record Number: [id number] Patient Account Number: [id number] Date of Birth/Sex: Treating RN: 1951/02/27 (72 y.o. M) Primary Care [name]: [name] Other Clinician: Referring Nygeria Lager: Treating [name]/Extender: [name]: 12 Vital Signs Height(in): 77 Pulse(bpm): 80 Weight(lbs): 318 Blood Pressure(mmHg): 160/89 Body Mass Index(BMI): 37.7 Temperature(F): 98.1 Respiratory Rate(breaths/min): 20 [3:Photos:] [N/A:N/A] Left Gluteus N/A N/A Wound Location: Gradually Appeared N/A N/A Wounding Event: Pressure Ulcer N/A N/A Primary  Etiology: Congestive Heart Failure, N/A N/A Comorbid History: Hypertension, Type I Diabetes, Neuropathy, Paraplegia 05/14/2021 N/A N/A Date Acquired: 12 N/A N/A Weeks of Treatment: Open N/A N/A Wound Status: No N/A N/A Wound Recurrence: 13x15x0.1 N/A N/A Measurements L x W x D (cm) 153.153 N/A N/A A (cm) : rea 15.315 N/A N/A Volume (cm) : 8.20% N/A N/A % Reduction in A rea: 8.20% N/A N/A % Reduction in Volume: Category/Stage III N/A N/A Classification: Medium N/A N/A Exudate A mount: Serosanguineous N/A N/A Exudate Type: red, brown N/A N/A Exudate Color: Distinct, outline attached N/A N/A Wound Margin: Large (67-100%) N/A N/A Granulation A mount: Red, Hyper-granulation, Friable N/A  N/A Granulation QualityGARO, [name] ([id number]) [id number] Page 3 of 7 Small (1-33%) N/A N/A Necrotic Amount: Fat Layer (Subcutaneous Tissue): Yes N/A N/A Exposed Structures: Fascia: No Tendon: No Muscle: No Joint: No Bone: No None N/A N/A Epithelialization: Debridement - Selective/Open Wound N/A N/A Debridement: Pre-procedure Verification/Time Out 10:43 N/A N/A Taken: Slough N/A N/A Tissue Debrided: Non-Viable Tissue N/A N/A Level: 195 N/A N/A Debridement A (sq cm): rea Curette N/A N/A Instrument: Moderate N/A N/A Bleeding: Pressure N/A N/A Hemostasis A chieved: Procedure was tolerated well N/A N/A Debridement Treatment Response: 13x15x0.1 N/A N/A Post Debridement Measurements L x W x D (cm) 15.315 N/A N/A Post Debridement Volume: (cm) Category/Stage III N/A N/A Post Debridement Stage: Excoriation: Yes N/A N/A Periwound Skin Texture: Crepitus: No Rash: No Dry/Scaly: Yes N/A N/A Periwound Skin Moisture: Maceration: No Erythema: Yes N/A N/A Periwound Skin Color: No Abnormality N/A N/A Temperature: Debridement N/A N/A Procedures Performed: Treatment Notes Electronic Signature(s) Signed: 06/13/2022 10:47:27 AM By: Fredirick Maudlin MD FACS Entered By: Fredirick Maudlin on 06/13/2022 10:47:27 -------------------------------------------------------------------------------- Multi-Disciplinary Care Plan Details Patient Name: Date of Service: [name]. 06/13/2022 10:30 A M Medical Record Number: [id number] Patient Account Number: [id number] Date of Birth/Sex: Treating RN: 10/26/1950 (72 y.o. [name] Primary Care Ocie Stanzione: [name] Other Clinician: Referring Ladeidra Borys: Treating Gavin Telford/Extender: [name]: 12 Active Inactive Nutrition Nursing Diagnoses: Potential for alteratiion in Nutrition/Potential for imbalanced nutrition Goals: Patient/caregiver verbalizes understanding of need to maintain therapeutic glucose control per primary care physician Date Initiated: 03/19/2022 Target Resolution Date: 11/08/2022 Goal Status: Active Interventions: Provide education on elevated blood sugars and impact on wound healing Treatment Activities: Dietary management education, guidance and counseling : 03/19/2022 Notes: Orientation to the Wound Care Program Nursing Diagnoses: KENYATTA, KEIDEL ([id number]) [phone].pdf Page 4 of 7 Knowledge deficit related to the wound healing center program Goals: Patient/caregiver will verbalize understanding of the [place] Date Initiated: 03/19/2022 Target Resolution Date: 11/08/2022 Goal Status: Active Interventions: Provide education on orientation to the wound center Notes: Wound/Skin Impairment Nursing Diagnoses: Knowledge deficit related to ulceration/compromised skin integrity Goals: Ulcer/skin breakdown will have a volume reduction of 30% by week 4 Date Initiated: 03/19/2022 Target Resolution Date: 11/08/2022 Goal Status: Active Interventions: Assess ulceration(s) every visit Provide education on ulcer and skin care Treatment Activities: Skin care regimen  initiated : 03/19/2022 Notes: Electronic Signature(s) Signed: 06/13/2022 4:41:03 PM By: Adline Peals Entered By: Adline Peals on 06/13/2022 10:39:52 -------------------------------------------------------------------------------- Pain Assessment Details Patient Name: Date of Service: [name]. 06/13/2022 10:30 A M Medical Record Number: [id number] Patient Account Number: [id number] Date of Birth/Sex: Treating RN: [birth year] (72 y.o. [name] Primary Care Zulma Court: [name] Other Clinician: Referring Katelee Schupp: Treating Rolan Wrightsman/Extender: [name]: 12 Active Problems Location of Pain Severity and Description of Pain Patient Has Paino Yes Site Locations Pain Location: Pain in Ulcers Duration of the Pain. Constant / Intermittento Constant Rate the pain. Current Pain Level: Insensate Pain Management and Medication Current Pain Management: HAWARD, POPE ([id number]) [phone].pdf Page 5 of 7 Electronic Signature(s) Signed: 06/13/2022 4:41:03 PM By: Sabas Sous By: Adline Peals on 06/13/2022 10:39:38 -------------------------------------------------------------------------------- Patient/Caregiver Education Details Patient Name: Date of Service: [name] 2/2/2024andnbsp10:30 [place] Record Number: [id number] Patient Account Number: [id number] Date of Birth/Gender: Treating RN: [birth year] (72 y.o. [name] Primary Care Physician: [name] Other Clinician: Referring Physician: Treating Physician/Extender: Fredirick Maudlin  [name] Weeks in Treatment: 12 Education Assessment Education Provided To: Patient Education Topics Provided Wound/Skin Impairment: Methods: Explain/Verbal Responses: Reinforcements needed, State content correctly [employer]) Signed: 06/13/2022 4:41:03 PM By: Adline Peals Entered By:  Adline Peals on 06/13/2022 10:40:05 -------------------------------------------------------------------------------- Wound Assessment Details Patient Name: Date of Service: [name]. 06/13/2022 10:30 A M Medical Record Number: [id number] Patient Account Number: [id number] Date of Birth/Sex: Treating RN: 04-06-1951 (73 y.o. [name] Primary Care Jaydalynn Olivero: [name] Other Clinician: Referring Keyla Milone: Treating Sriman Tally/Extender: [name]: 12 Wound Status Wound Number: 3 Primary Pressure Ulcer Etiology: Wound Location: Left Gluteus Wound Open Wounding Event: Gradually Appeared Status: Date Acquired: 05/14/2021 Comorbid Congestive Heart Failure, Hypertension, Type I Diabetes, Weeks Of Treatment: 12 History: Neuropathy, Paraplegia Clustered Wound: No Photos GURSHAN, SETTLEMIRE ([id number]) [id number] Page 6 of 7 Wound Measurements Length: (cm) 13 Width: (cm) 15 Depth: (cm) 0.1 Area: (cm) 153.153 Volume: (cm) 15.315 % Reduction in Area: 8.2% % Reduction in Volume: 8.2% Epithelialization: None Tunneling: No Undermining: No Wound Description Classification: Category/Stage III Wound Margin: Distinct, outline attached Exudate Amount: Medium Exudate Type: Serosanguineous Exudate Color: red, brown Foul Odor After Cleansing: No Slough/Fibrino Yes Wound Bed Granulation Amount: Large (67-100%) Exposed Structure Granulation Quality: Red, Hyper-granulation, Friable Fascia Exposed: No Necrotic Amount: Small (1-33%) Fat Layer (Subcutaneous Tissue) Exposed: Yes Necrotic Quality: Adherent Slough Tendon Exposed: No Muscle Exposed: No Joint Exposed: No Bone Exposed: No Periwound Skin Texture Texture Color No Abnormalities Noted: No No Abnormalities Noted: No Crepitus: No Erythema: Yes Excoriation: Yes Temperature / Pain Rash: No Temperature: No Abnormality Moisture No Abnormalities  Noted: No Dry / Scaly: Yes Maceration: No Treatment Notes Wound #3 (Gluteus) Wound Laterality: Left Cleanser Soap and Water Discharge Instruction: May shower and wash wound with dial antibacterial soap and water prior to dressing change. Wound Cleanser Discharge Instruction: Cleanse the wound with wound cleanser prior to applying a clean dressing using gauze sponges, not tissue or cotton balls. Peri-Wound Care Zinc Oxide Ointment 30g tube Discharge Instruction: Apply Zinc Oxide to periwound with each dressing change Topical Primary Dressing Sorbact Superabsorbent Dressing, 8x8 (in/in) Discharge Instruction: Needs 2 for the wound Secondary Dressing ABD Pad, 8x10 Discharge Instruction: Apply over primary dressing as directed. Secured With [employer] Surgical T 2x10 (in/yd) ape Discharge Instruction: Secure with tape as directed. GREGORI, ABRIL ([id number]) [id number] Page 7 of 7 Compression Wrap Compression Stockings Add-Ons Electronic Signature(s) Signed: 06/13/2022 4:41:03 PM By: Adline Peals Signed: 06/13/2022 5:05:53 PM By: [name] Entered By: Blanche East on 06/13/2022 10:40:05 -------------------------------------------------------------------------------- Vitals Details Patient Name: Date of Service: [name]. 06/13/2022 10:30 A M Medical Record Number: [id number] Patient Account Number: [id number] Date of Birth/Sex: Treating RN: [birth year] (72 y.o. [name] Primary Care Carinne Brandenburger: [name] Other Clinician: Referring Keeshia Sanderlin: Treating Kainon Varady/Extender: [name]: 12 Vital Signs Time Taken: 10:35 Temperature (F): 98.1 Height (in): 77 Pulse (bpm): 80 Weight (lbs): 318 Respiratory Rate (breaths/min): 20 Body Mass Index (BMI): 37.7 Blood Pressure (mmHg): 160/89 Reference Range: 80 - 120 mg / dl Electronic Signature(s) Signed: 06/13/2022 4:41:03 PM  By: Adline Peals Entered By: Adline Peals on 06/13/2022 10:39:25

## 2022-06-13 NOTE — Progress Notes (Addendum)
ERASTUS, NICASIO ([id number]) [id number] Page 1 of 8 Visit Report for 06/13/2022 Chief Complaint Document Details Patient Name: Date of Service: [name] 06/13/2022 10:30 A M Medical Record Number: [id number] Patient Account Number: [id number] Date of Birth/Sex: Treating RN: 12/22/1950 (72 y.o. M) Primary Care Provider: [name] Other Clinician: Referring Provider: Treating Provider/Extender: [name]: 12 Information Obtained from: Patient Chief Complaint Here for follow up abdominal wound present following laparotomy, colostomy. 03/19/2022: Here for pressure ulcer on left gluteus and ulcer adjacent to ileostomy Electronic Signature(s) Signed: 06/13/2022 10:47:37 AM By: Fredirick Maudlin MD FACS Entered By: Fredirick Maudlin on 06/13/2022 10:47:37 -------------------------------------------------------------------------------- Debridement Details Patient Name: Date of Service: [name]. 06/13/2022 10:30 A M Medical Record Number: [id number] Patient Account Number: [id number] Date of Birth/Sex: Treating RN: [birth year] (72 y.o. [name] Primary Care Provider: [name] Other Clinician: Referring Provider: Treating Provider/Extender: [name]: 12 Debridement Performed for Assessment: Wound #3 Left Gluteus Performed By: Physician Fredirick Maudlin, MD Debridement Type: Debridement Level of Consciousness (Pre-procedure): Awake and Alert Pre-procedure Verification/Time Out Yes - 10:43 Taken: Start Time: 10:44 T Area Debrided (L x W): otal 13 (cm) x 15 (cm) = 195 (cm) Tissue and other material debrided: Non-Viable, Slough, Biofilm, Slough Level: Non-Viable Tissue Debridement Description: Selective/Open Wound Instrument: Curette Bleeding: Moderate Hemostasis Achieved: Pressure Response to Treatment: Procedure was tolerated well Level of Consciousness  (Post- Awake and Alert procedure): Post Debridement Measurements of Total Wound Length: (cm) 13 Stage: Category/Stage III Width: (cm) 15 Depth: (cm) 0.1 Volume: (cm) 15.315 Character of Wound/Ulcer Post Debridement: Improved Post Procedure Diagnosis Same as Pre-procedure Notes Scribed for Dr. Celine Ahr by Blanche East, RN Electronic Signature(s) Signed: 06/13/2022 11:24:18 AM By: Fredirick Maudlin MD FACS Signed: 06/13/2022 5:05:53 PM By: [name] Entered By: Blanche East on 06/13/2022 10:45:58 [name] ([id number]) [id number] Page 2 of 8 -------------------------------------------------------------------------------- HPI Details Patient Name: Date of Service: [name] 06/13/2022 10:30 A M Medical Record Number: [id number] Patient Account Number: [id number] Date of Birth/Sex: Treating RN: Mar 17, 1951 (72 y.o. M) Primary Care Provider: [name] Other Clinician: Referring Provider: Treating Provider/Extender: [name]: 12 History of Present Illness HPI Description: On medihoney with border dressing due to stalling on collagen and concern ABD was pulling off skin. 09/11/14 Nearly healed, counseled given the thin attenuated scar skin I expect will have some recurrence wounds with slight trauma or maceration, however much improved today. cont ABD pads and medihoney. 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 He resides with his sister and is on a regular hospital bed. The 2 of them reported that for about the last year, he has had ulceration adjacent to his  ileostomy as well as on his left gluteus and upper posterior thigh. Most recent hemoglobin A1c was 6.3%. On his left gluteus, there is a stage II pressure ulcer with surrounding tissue maceration and excoriation. There is a little slough on the wound surface. Adjacent to his ileostomy, associated with his appliance, there is an ulcer that extends into the fat layer. It is clean without any slough or necrotic tissue. 04/10/2022: The left gluteal ulcer  has expanded and is quite a bit larger today. There is slough and eschar accumulation. The patient's sister reports that the ileostomy associated wound got better and so they did not go to see the outpatient ostomy nurse. They have not received the low-air-loss mattress. 05/01/2022: Apparently the low-air-loss mattress has been denied by his insurance, but they are willing to cover a gel mattress overlay. The ileostomy-area wound has contracted further, per the patient's sister; we are not managing this wound at this time. The left gluteal ulcer seems to be about the same size, but there is extensive slough on all of the open surfaces. His skin is extremely friable. 05/22/2022: The gel mattress overlay was finally delivered last week. The left gluteal ulcer has deteriorated somewhat. There is slough on all of the open surfaces and the total area of the wound is larger. It has a funky odor to it today. 06/13/2022: The gluteal wound is smaller and has substantially less slough. Epithelium is filling in around the margins. The odor that was present at his last visit has dissipated. Electronic Signature(s) Signed: 06/13/2022 10:48:27 AM By: Fredirick Maudlin MD FACS Entered By: Fredirick Maudlin on 06/13/2022 10:48:27 -------------------------------------------------------------------------------- Physical Exam Details Patient Name: Date of Service: [name] 06/13/2022 10:30 A M Medical Record Number: [id number] Patient Account Number: [id number] Date of  Birth/Sex: Treating RN: 11/25/1950 (72 y.o. M) Primary Care Provider: [name] Other Clinician: Referring Provider: Treating Provider/Extender: [name]: 12 Constitutional Hypertensive, asymptomatic. . . . no acute distress. Respiratory Normal work of breathing on room air. Notes 06/13/2022: The gluteal wound is smaller and has substantially less slough. Epithelium is filling in around the margins. The odor that was present at his last visit has dissipated. Electronic Signature(s) Signed: 06/13/2022 10:48:59 AM By: Fredirick Maudlin MD FACS Entered By: Fredirick Maudlin on 06/13/2022 10:48:59 Physician Orders Details -------------------------------------------------------------------------------- [name] ([id number]) [id number] Page 3 of 8 Patient Name: Date of Service: [name] 06/13/2022 10:30 A M Medical Record Number: [id number] Patient Account Number: [id number] Date of Birth/Sex: Treating RN: 1951/01/28 (72 y.o. [name] Primary Care Provider: [name] Other Clinician: Referring Provider: Treating Provider/Extender: [name]: 12 Verbal / Phone Orders: No Diagnosis Coding Follow-up Appointments Return appointment in 3 weeks. - *****STRETCHER*******Dr. Celine Ahr Rm 1 ***Needs extra time, STRETCHER**** Thursday 07/03/22 at 10:30 Off-Loading Wound #3 Left Gluteus Turn and reposition every 2 hours Home Health Wound #3 Left Gluteus Other Home Health Orders/Instructions: - [place] Wound Treatment Wound #3 - Gluteus Wound Laterality: Left Cleanser: Soap and Water 1 x Per Day/30 Days Discharge Instructions: May shower and wash wound with dial antibacterial soap and water prior to dressing change. Cleanser: Wound Cleanser (Generic) 1 x Per Day/30 Days Discharge Instructions: Cleanse the wound with wound cleanser prior to  applying a clean dressing using gauze sponges, not tissue or cotton balls. Peri-Wound Care: Zinc Oxide Ointment 30g tube (Generic) 1 x Per Day/30 Days Discharge Instructions: Apply Zinc Oxide to periwound with each dressing change Prim Dressing: Sorbact Superabsorbent Dressing, 8x8 (in/in) (Generic) 1 x Per Day/30 Days ary Discharge Instructions: Needs 2 for the wound Secondary Dressing: ABD Pad, 8x10 (Generic) 1 x Per Day/30 Days Discharge Instructions: Apply over primary dressing as directed. Secured With: 7M Medipore Public affairs consultant Surgical T 2x10 (in/yd) (Generic) 1 x Per Day/30 Days ape Discharge Instructions: Secure with tape as directed. Electronic Signature(s) Signed: 06/13/2022 11:24:18  AM By: Fredirick Maudlin MD FACS Signed: 06/13/2022 5:05:53 PM By: [name] Previous Signature: 06/13/2022 10:49:15 AM Version By: Fredirick Maudlin MD FACS Entered By: Blanche East on 06/13/2022 10:55:25 -------------------------------------------------------------------------------- Problem List Details Patient Name: Date of Service: [name]. 06/13/2022 10:30 A M Medical Record Number: [id number] Patient Account Number: [id number] Date of Birth/Sex: Treating RN: 31-May-1950 (73 y.o. M) Primary Care Provider: [name] Other Clinician: Referring Provider: Treating Provider/Extender: [name]: 12 Active Problems ICD-10 Encounter Code Description Active Date MDM Diagnosis [phone] Pressure ulcer of left buttock, stage 3 03/19/2022 No Yes E66.01 Morbid (severe) obesity due to excess calories 03/19/2022 No Yes [name], [name] ([id number]) [id number] Page 4 of 8 G62.89 Other specified polyneuropathies 03/19/2022 No Yes Z93.2 Ileostomy status 03/19/2022 No Yes E11.622 Type 2 diabetes mellitus with other skin ulcer 03/19/2022 No Yes Inactive Problems ICD-10 Code Description Active Date Inactive Date L98.492 Non-pressure  chronic ulcer of skin of other sites with fat layer exposed 03/19/2022 03/19/2022 Resolved Problems Electronic Signature(s) Signed: 06/13/2022 11:09:41 AM By: [name] Signed: 06/13/2022 11:24:18 AM By: Fredirick Maudlin MD FACS Previous Signature: 06/13/2022 10:46:50 AM Version By: Fredirick Maudlin MD FACS Entered By: Blanche East on 06/13/2022 11:09:40 -------------------------------------------------------------------------------- Progress Note Details Patient Name: Date of Service: [name]. 06/13/2022 10:30 A M Medical Record Number: [id number] Patient Account Number: [id number] Date of Birth/Sex: Treating RN: [birth year] (72 y.o. M) Primary Care Provider: [name] Other Clinician: Referring Provider: Treating Provider/Extender: [name]: 12 Subjective Chief Complaint Information obtained from Patient Here for follow up abdominal wound present following laparotomy, colostomy. 03/19/2022: Here for pressure ulcer on left gluteus and ulcer adjacent to ileostomy History of Present Illness (HPI) On medihoney with border dressing due to stalling on collagen and concern ABD was pulling off skin. 09/11/14 Nearly healed, counseled given the thin attenuated scar skin I expect will have some recurrence wounds with slight trauma or maceration, however much improved today. cont ABD pads and medihoney. 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 He resides with his sister and is on a regular hospital bed. The 2 of them reported that for about the last year, he has had ulceration adjacent to his ileostomy as well  as on his left gluteus and upper posterior thigh. Most recent hemoglobin A1c was 6.3%. On his left gluteus, there is a stage II pressure ulcer with surrounding tissue maceration and excoriation. There is a little slough on the wound surface. Adjacent to his ileostomy, associated with his appliance, there is an ulcer that extends into the fat layer. It is clean without any slough or necrotic tissue. 04/10/2022: The left gluteal ulcer has expanded and is quite a bit larger today. There is slough and eschar accumulation. The patient's sister reports that the ileostomy associated wound got better and so they did not go to see the outpatient ostomy nurse. They have not received the low-air-loss mattress. 05/01/2022: Apparently the low-air-loss mattress has been denied by his insurance, but they are willing to cover a gel mattress overlay. The ileostomy-area wound has contracted further, per the patient's sister; we are not managing this wound at this time. The left gluteal  ulcer seems to be about the same size, but there is extensive slough on all of the open surfaces. His skin is extremely friable. 05/22/2022: The gel mattress overlay was finally delivered last week. The left gluteal ulcer has deteriorated somewhat. There is slough on all of the open surfaces and the total area of the wound is larger. It has a funky odor to it today. 06/13/2022: The gluteal wound is smaller and has substantially less slough. Epithelium is filling in around the margins. The odor that was present at his last visit has dissipated. Patient History [name], [name] ([id number]) [id number] Page 5 of 8 Information obtained from Patient. Family History Cancer - Father,Mother, Diabetes - Father,Mother,Siblings, Heart Disease - [place], Hypertension - Father,Siblings, Kidney Disease - Siblings. Social History Former smoker - ended on 08/10/2013, Marital Status - Single, Alcohol Use - Rarely, Drug  Use - No History, Caffeine Use - Rarely. Medical History Constitutional Symptoms (General Health) Patient has history of Congestive Heart Failure Eyes Denies history of Cataracts, Glaucoma, Optic Neuritis Ear/Nose/Mouth/Throat Denies history of Chronic sinus problems/congestion, Middle ear problems Cardiovascular Patient has history of Hypertension Endocrine Patient has history of Type I Diabetes Denies history of Type 1 Diabetes Genitourinary Denies history of End Stage Renal Disease Neurologic Patient has history of Neuropathy, Paraplegia Psychiatric Denies history of Anorexia/bulimia, Confinement Anxiety Medical A Surgical History Notes nd Cardiovascular atrial fibrillation Musculoskeletal arthritis Objective Constitutional Hypertensive, asymptomatic. no acute distress. Vitals Time Taken: 10:35 AM, Height: 77 in, Weight: 318 lbs, BMI: 37.7, Temperature: 98.1 F, Pulse: 80 bpm, Respiratory Rate: 20 breaths/min, Blood Pressure: 160/89 mmHg. Respiratory Normal work of breathing on room air. General Notes: 06/13/2022: The gluteal wound is smaller and has substantially less slough. Epithelium is filling in around the margins. The odor that was present at his last visit has dissipated. Integumentary (Hair, Skin) Wound #3 status is Open. Original cause of wound was Gradually Appeared. The date acquired was: 05/14/2021. The wound has been in treatment 12 weeks. The wound is located on the Left Gluteus. The wound measures 13cm length x 15cm width x 0.1cm depth; 153.153cm^2 area and 15.315cm^3 volume. There is Fat Layer (Subcutaneous Tissue) exposed. There is no tunneling or undermining noted. There is a medium amount of serosanguineous drainage noted. The wound margin is distinct with the outline attached to the wound base. There is large (67-100%) red, friable, hyper - granulation within the wound bed. There is a small (1- 33%) amount of necrotic tissue within the wound bed including  Adherent Slough. The periwound skin appearance exhibited: Excoriation, Dry/Scaly, Erythema. The periwound skin appearance did not exhibit: Crepitus, Rash, Maceration. The surrounding wound skin color is noted with erythema. Periwound temperature was noted as No Abnormality. 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 There was a Selective/Open Wound Non-Viable Tissue Debridement with a total area of 195 sq cm performed by Fredirick Maudlin, MD. With the following instrument(s): Curette to remove Non-Viable tissue/material. Material removed includes Slough and Biofilm and. No specimens were taken. A time out was conducted at 10:43, prior to the start of the procedure. A Moderate amount of bleeding was controlled with Pressure. The procedure was tolerated well. Post Debridement Measurements: 13cm length x 15cm  width x 0.1cm depth; 15.315cm^3 volume. Post debridement Stage noted as Category/Stage III. Character of Wound/Ulcer Post Debridement is improved. Post procedure Diagnosis Wound #3: Same as Pre-Procedure General Notes: Scribed for Dr. Celine Ahr by Blanche East, RN. Plan Follow-up Appointments: Return appointment in 3 weeks. - *****STRETCHER*******Dr. Celine Ahr Rm 1 ***Needs extra time, STRETCHER**** Thursday 07/03/22 at 10:30 Off-Loading: Wound #3 Left Gluteus: Turn and reposition every 2 hours Home Health: Wound #3 Left Gluteus: Other Home Health Orders/Instructions: - [place] #3: - Gluteus Wound Laterality: Left Cleanser: Soap and Water 1 x Per Day/30 Days Discharge Instructions: May shower and wash  wound with dial antibacterial soap and water prior to dressing change. Cleanser: Wound Cleanser (Generic) 1 x Per Day/30 Days Discharge Instructions: Cleanse the wound with wound cleanser prior to applying a clean dressing using gauze sponges, not tissue or cotton balls. Peri-Wound Care: Zinc Oxide Ointment 30g tube (Generic) 1 x Per Day/30 Days Discharge Instructions: Apply Zinc Oxide to periwound with each dressing change Prim Dressing: Sorbact Superabsorbent Dressing, 8x8 (in/in) (Generic) 1 x Per Day/30 Days ary Discharge Instructions: Needs 2 for the wound Secondary Dressing: ABD Pad, 8x10 (Generic) 1 x Per Day/30 Days Discharge Instructions: Apply over primary dressing as directed. Secured With: 71M Medipore Public affairs consultant Surgical T 2x10 (in/yd) (Generic) 1 x Per Day/30 Days ape Discharge Instructions: Secure with tape as directed. 06/13/2022: The gluteal wound is smaller and has substantially less slough. Epithelium is filling in around the margins. The odor that was present at his last visit has dissipated. I used a curette to debride slough and biofilm from his wound. I think the Sorbact is doing a good job, so we will continue this. Follow-up in 3 weeks. Electronic Signature(s) Signed: 06/29/2022 2:19:36 PM By: Deon Pilling RN, BSN Signed: 07/07/2022 5:14:33 PM By: Fredirick Maudlin MD FACS Previous Signature: 06/13/2022 10:49:52 AM Version By: Fredirick Maudlin MD FACS Entered By: Deon Pilling on 06/29/2022 14:07:37 -------------------------------------------------------------------------------- HxROS Details Patient Name: Date of Service: [name]. 06/13/2022 10:30 A M Medical Record Number: [id number] Patient Account Number: [id number] Date of Birth/Sex: Treating RN: 24-Jun-1950 (72 y.o. M) Primary Care Provider: [name] Other Clinician: Referring Provider: Treating Provider/Extender: [name]: 12 Information Obtained  From Patient Constitutional Symptoms (General Health) Medical History: Positive for: Congestive Heart Failure Eyes Medical History: Negative for: Cataracts; Glaucoma; Optic Neuritis Ear/Nose/Mouth/Throat Medical History: Negative for: Chronic sinus problems/congestion; Middle ear problems [name], [name] ([id number]) [id number] Page 7 of 8 Cardiovascular Medical History: Positive for: Hypertension Past Medical History Notes: atrial fibrillation Endocrine Medical History: Positive for: Type I Diabetes Negative for: Type 1 Diabetes Time with diabetes: less then 10 Treated with: Oral agents Genitourinary Medical History: Negative for: End Stage Renal Disease Musculoskeletal Medical History: Past Medical History Notes: arthritis Neurologic Medical History: Positive for: Neuropathy; Paraplegia Psychiatric Medical History: Negative for: Anorexia/bulimia; Confinement Anxiety Immunizations Pneumococcal Vaccine: Received Pneumococcal Vaccination: Yes Received Pneumococcal Vaccination On or After 60th Birthday: Yes Implantable Devices None Family and Social History Cancer: Yes - Father,Mother; Diabetes: Yes - Father,Mother,Siblings; Heart Disease: Yes - Mother,Siblings; Hypertension: Yes - Father,Siblings; Kidney Disease: Yes - Siblings; Former smoker - ended on 08/10/2013; Marital Status - Single; Alcohol Use: Rarely; Drug Use: No History; Caffeine Use: Rarely; Financial Concerns: No; Food, Clothing or Shelter Needs: No; Support System Lacking: No; Transportation Concerns: No Electronic Signature(s) Signed: 06/13/2022 11:24:18 AM By: Fredirick Maudlin MD FACS Entered By: Fredirick Maudlin on 06/13/2022 10:48:33 --------------------------------------------------------------------------------  SuperBill Details Patient Name: Date of Service: [name] 06/13/2022 Medical Record Number: [id number] Patient Account Number: [id number] Date of  Birth/Sex: Treating RN: [birth year] (72 y.o. M) Primary Care Provider: [name] Other Clinician: Referring Provider: Treating Provider/Extender: [name]: 12 Diagnosis Coding ICD-10 Codes Code Description [phone] Pressure ulcer of left buttock, stage 3 E66.01 Morbid (severe) obesity due to excess calories G62.89 Other specified polyneuropathies [name], [name] ([id number]) [id number] Page 8 of 8 Z93.2 Ileostomy status E11.622 Type 2 diabetes mellitus with other skin ulcer Facility Procedures : CPT4 Code: [id number] Description: [id number] - DEBRIDE WOUND 1ST 20 SQ CM OR < ICD-10 Diagnosis Description L89.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, stage 3 Modifier: Quantity: 1 : CPT4 Code: [id number] Description: [id number] - DEBRIDE WOUND EA ADDL 20 SQ CM ICD-10 Diagnosis Description L89.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, stage 3 Modifier: Quantity: 9 Physician Procedures : CPT4 Code Description Modifier [id number] - WC PHYS LEVEL 3 - EST PT 25 ICD-10 Diagnosis Description L89.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, 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 E66.01 Morbid (severe) obesity due to excess  calories G62.89 Other specified polyneuropathies Quantity: 1 : [id number] - WC PHYS DEBR WO ANESTH 20 SQ CM ICD-10 Diagnosis Description L89.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, stage 3 Quantity: 1 : [id number] - WC PHYS DEBR WO ANESTH EA ADD 20 CM ICD-10 Diagnosis Description L89.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, stage 3 Quantity: 9 Electronic Signature(s) Signed: 06/13/2022 10:50:17 AM By: Fredirick Maudlin MD FACS Entered By: Fredirick Maudlin on 06/13/2022 10:50:17

## 2022-07-03 ENCOUNTER — Encounter (HOSPITAL_BASED_OUTPATIENT_CLINIC_OR_DEPARTMENT_OTHER): Payer: Medicare Other | Admitting: General Surgery

## 2022-07-03 DIAGNOSIS — E11622 Type 2 diabetes mellitus with other skin ulcer: Secondary | ICD-10-CM | POA: Diagnosis not present

## 2022-07-04 NOTE — Progress Notes (Signed)
LEDARIUS, TORBECK ([id number]) [id number] Page 1 of 9 Visit Report for 07/03/2022 Chief Complaint Document Details Patient Name: Date of Service: [name] 07/03/2022 10:30 A M Medical Record Number: [id number] Patient Account Number: [id number] Date of Birth/Sex: Treating RN: 1951/02/14 (72 y.o. M) Primary Care Provider: [name] Other Clinician: Referring Provider: Treating Provider/Extender: [name]: 15 Information Obtained from: Patient Chief Complaint Here for follow up abdominal wound present following laparotomy, colostomy. 03/19/2022: Here for pressure ulcer on left gluteus and ulcer adjacent to ileostomy Electronic Signature(s) Signed: 07/03/2022 12:06:50 PM By: Fredirick Maudlin MD FACS Entered By: Fredirick Maudlin on 07/03/2022 12:06:50 -------------------------------------------------------------------------------- Debridement Details Patient Name: Date of Service: [name]. 07/03/2022 10:30 A M Medical Record Number: [id number] Patient Account Number: [id number] Date of Birth/Sex: Treating RN: 04-14-1951 (72 y.o. Ernestene Mention Primary Care Provider: [name] Other Clinician: Referring Provider: Treating Provider/Extender: [name]: 15 Debridement Performed for Assessment: Wound #3 Left Gluteus Performed By: Physician Fredirick Maudlin, MD Debridement Type: Debridement Level of Consciousness (Pre-procedure): Awake and Alert Pre-procedure Verification/Time Out Yes - 11:45 Taken: Start Time: 11:47 Pain Control: Lidocaine 4% T opical Solution T Area Debrided (L x W): otal 6 (cm) x 6 (cm) = 36 (cm) Tissue and other material debrided: Non-Viable, Slough, Biofilm, Slough Level: Non-Viable Tissue Debridement Description: Selective/Open Wound Instrument: Curette Bleeding: Minimum Hemostasis Achieved: Pressure Procedural Pain: 4 Post  Procedural Pain: 3 Response to Treatment: Procedure was tolerated well Level of Consciousness (Post- Awake and Alert procedure): Post Debridement Measurements of Total Wound Length: (cm) 10 Stage: Category/Stage III Width: (cm) 12 Depth: (cm) 0.1 Volume: (cm) 9.425 Character of Wound/Ulcer Post Debridement: Improved JASIRE, SANDFORD ([id number]) [id number] Page 2 of 9 Post Procedure Diagnosis Same as Pre-procedure Notes scribed by Baruch Gouty, RN for Dr. Celine Ahr Electronic Signature(s) Signed: 07/03/2022 12:40:26 PM By: Fredirick Maudlin MD FACS Signed: 07/03/2022 5:03:11 PM By: Baruch Gouty RN, BSN Entered By: Baruch Gouty on 07/03/2022 11:48:50 -------------------------------------------------------------------------------- HPI Details Patient Name: Date of Service: [name]. 07/03/2022 10:30 A M Medical Record Number: [id number] Patient Account Number: [id number] Date of Birth/Sex: Treating RN: 1950-11-13 (72 y.o. M) Primary Care Provider: [name] Other Clinician: Referring Provider: Treating Provider/Extender: [name]: 15 History of Present Illness HPI Description: On medihoney with border dressing due to stalling on collagen and concern ABD was pulling off skin. 09/11/14 Nearly healed, counseled given the thin attenuated scar skin I expect will have some recurrence wounds with slight trauma or maceration, however much improved today. cont ABD pads and medihoney. 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 He resides with his sister and is on a regular  hospital bed. The 2 of them reported that for about the last year, he has had ulceration adjacent to his ileostomy as well as on his left gluteus and upper posterior thigh. Most recent hemoglobin A1c was 6.3%. On his left gluteus, there is a stage II pressure ulcer with surrounding tissue maceration and excoriation. There is a little slough on the wound surface. Adjacent to his ileostomy, associated with his appliance, there is an ulcer that extends into the fat  layer. It is clean without any slough or necrotic tissue. 04/10/2022: The left gluteal ulcer has expanded and is quite a bit larger today. There is slough and eschar accumulation. The patient's sister reports that the ileostomy associated wound got better and so they did not go to see the outpatient ostomy nurse. They have not received the low-air-loss mattress. 05/01/2022: Apparently the low-air-loss mattress has been denied by his insurance, but they are willing to cover a gel mattress overlay. The ileostomy-area wound has contracted further, per the patient's sister; we are not managing this wound at this time. The left gluteal ulcer seems to be about the same size, but there is extensive slough on all of the open surfaces. His skin is extremely friable. 05/22/2022: The gel mattress overlay was finally delivered last week. The left gluteal ulcer has deteriorated somewhat. There is slough on all of the open surfaces and the total area of the wound is larger. It has a funky odor to it today. 06/13/2022: The gluteal wound is smaller and has substantially less slough. Epithelium is filling in around the margins. The odor that was present at his last visit has dissipated. 07/03/2022: The gluteal wound is a little bit smaller again today. It seems more superficial and just has a light layer of slough on the surface. Electronic Signature(s) Signed: 07/03/2022 12:10:04 PM By: Fredirick Maudlin MD FACS Entered By: Fredirick Maudlin on 07/03/2022  12:10:04 -------------------------------------------------------------------------------- Physical Exam Details Patient Name: Date of Service: [name] 07/03/2022 10:30 A M Medical Record Number: [id number] Patient Account Number: [id number] Date of Birth/Sex: Treating RN: 06-05-1950 (71 y.o. Qwinton Zimbelman, Denice Bors ([id number]) [id number] Page 3 of 9 Primary Care Provider: [name] Other Clinician: Referring Provider: Treating Provider/Extender: [name]: 15 Constitutional . . . . no acute distress. Respiratory Normal work of breathing on room air. Notes 07/03/2022: The gluteal wound is a little bit smaller again today. It seems more superficial and just has a light layer of slough on the surface. Electronic Signature(s) Signed: 07/03/2022 12:10:38 PM By: Fredirick Maudlin MD FACS Entered By: Fredirick Maudlin on 07/03/2022 12:10:38 -------------------------------------------------------------------------------- Physician Orders Details Patient Name: Date of Service: [name]. 07/03/2022 10:30 A M Medical Record Number: [id number] Patient Account Number: [id number] Date of Birth/Sex: Treating RN: 06/06/1950 (72 y.o. 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 - *****STRETCHER*******Dr. Alvera Singh ***Needs extra time, STRETCHER**** Anesthetic Wound #3 Left Gluteus (In clinic) Topical Lidocaine 4% applied to wound bed Bathing/  Shower/ Hygiene May shower and wash wound with soap and water. Off-Loading Wound #3 Left Gluteus Gel mattress overlay (Group 1) Turn and reposition every 2 hours Wound Treatment Wound #3 - Gluteus Wound Laterality: Left Cleanser: Soap and Water 1 x Per Day/30 Days Discharge Instructions: May shower and wash wound with dial antibacterial soap and water prior to dressing change. Cleanser: Wound Cleanser (Generic) 1 x Per Day/30 Days Discharge Instructions: Cleanse the wound with wound cleanser prior to applying a clean dressing using gauze sponges, not tissue or cotton balls. Peri-Wound Care: Zinc Oxide Ointment 30g tube (Generic) 1  x Per Day/30 Days Discharge Instructions: Apply Zinc Oxide to periwound with each dressing change Prim Dressing: Sorbact Superabsorbent Dressing, 8x8 (in/in) (Generic) 1 x Per Day/30 Days JEREME, DETORO ([id number]) [id number].pdf Page 4 of 9 Discharge Instructions: Needs 2 for the wound Secondary Dressing: ABD Pad, 8x10 (Generic) 1 x Per Day/30 Days Discharge Instructions: Apply over primary dressing as directed. Secured With: 31M Medipore Public affairs consultant Surgical T 2x10 (in/yd) (Generic) 1 x Per Day/30 Days ape Discharge Instructions: Secure with tape as directed. Patient Medications llergies: penicillin A Notifications Medication Indication Start End prior to debridement 07/03/2022 lidocaine DOSE topical 4 % cream - cream topical Electronic Signature(s) Signed: 07/03/2022 12:40:26 PM By: Fredirick Maudlin MD FACS Entered By: Fredirick Maudlin on 07/03/2022 12:10:53 -------------------------------------------------------------------------------- Problem List Details Patient Name: Date of Service: [name]. 07/03/2022 10:30 A M Medical Record Number: [id number] Patient Account Number: [id number] Date of Birth/Sex: Treating RN: 12-11-1950 (72 y.o. Ernestene Mention Primary Care Provider: [name] Other  Clinician: Referring Provider: Treating Provider/Extender: [name]: 15 Active Problems ICD-10 Encounter Code Description Active Date MDM Diagnosis [phone] Pressure ulcer of left buttock, stage 3 03/19/2022 No Yes E66.01 Morbid (severe) obesity due to excess calories 03/19/2022 No Yes G62.89 Other specified polyneuropathies 03/19/2022 No Yes Z93.2 Ileostomy status 03/19/2022 No Yes E11.622 Type 2 diabetes mellitus with other skin ulcer 03/19/2022 No Yes Inactive Problems ICD-10 Code Description Active Date Inactive Date L98.492 Non-pressure chronic ulcer of skin of other sites with fat layer exposed 03/19/2022 03/19/2022 Resolved Problems JAYDIEL, HOLTHAUS ([id number]) [id number] Page 5 of 9 Electronic Signature(s) Signed: 07/03/2022 12:05:08 PM By: Fredirick Maudlin MD FACS Entered By: Fredirick Maudlin on 07/03/2022 12:05:07 -------------------------------------------------------------------------------- Progress Note Details Patient Name: Date of Service: [name]. 07/03/2022 10:30 A M Medical Record Number: [id number] Patient Account Number: [id number] Date of Birth/Sex: Treating RN: [birth year] (72 y.o. M) Primary Care Provider: [name] Other Clinician: Referring Provider: Treating Provider/Extender: [name]: 15 Subjective Chief Complaint Information obtained from Patient Here for follow up abdominal wound present following laparotomy, colostomy. 03/19/2022: Here for pressure ulcer on left gluteus and ulcer adjacent to ileostomy History of Present Illness (HPI) On medihoney with border dressing due to stalling on collagen and concern ABD was pulling off skin. 09/11/14 Nearly healed, counseled given the thin attenuated scar skin I expect will have some recurrence wounds with slight trauma or maceration, however much improved today. cont ABD pads and medihoney. 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 He resides with his sister and is on a regular hospital bed. The 2 of them reported that for about the last year, he has had ulceration adjacent to his ileostomy as well as on his left gluteus and upper posterior thigh. Most recent hemoglobin A1c was 6.3%. On his left gluteus, there is a stage II pressure ulcer with surrounding tissue maceration and excoriation. There is a little slough on the wound surface. Adjacent to his ileostomy, associated with his appliance, there is an ulcer that extends into the fat layer. It is clean without any slough or necrotic tissue. 04/10/2022: The left gluteal ulcer has expanded and is quite a bit larger  today. There is slough and eschar accumulation. The patient's sister reports that the ileostomy associated wound got better and so they did not go to see the outpatient ostomy nurse. They have not received the low-air-loss mattress. 05/01/2022: Apparently the low-air-loss mattress has been denied by his insurance, but they are willing to cover a gel mattress overlay. The ileostomy-area wound has contracted further, per the patient's sister; we are not managing this wound at this time. The left gluteal ulcer seems to be about the same size, but there is extensive slough on all of the open surfaces. His skin is extremely friable. 05/22/2022: The gel mattress overlay was finally delivered last week. The left gluteal ulcer has deteriorated somewhat. There is slough on all of the open surfaces and the total area of the wound is larger. It has a funky odor to it today. 06/13/2022: The gluteal wound is  smaller and has substantially less slough. Epithelium is filling in around the margins. The odor that was present at his last visit has dissipated. 07/03/2022: The gluteal wound is a little bit smaller again today. It seems more superficial and just has a light layer of slough on the surface. Patient History Information obtained from Patient. Family History Cancer - Father,Mother, Diabetes - Father,Mother,Siblings, Heart Disease - [place], Hypertension - Father,Siblings, Kidney Disease - Siblings. Social History Former smoker - ended on 08/10/2013, Marital Status - Single, Alcohol Use - Rarely, Drug Use - No History, Caffeine Use - Rarely. Medical History Constitutional Symptoms (General Health) Patient has history of Congestive Heart Failure Eyes Denies history of Cataracts, Glaucoma, Optic Neuritis Ear/Nose/Mouth/Throat Denies history of Chronic sinus problems/congestion, Middle ear problems Cardiovascular Patient has history of Hypertension Endocrine Patient has history of Type I Diabetes Denies history of Type 1 Diabetes Genitourinary Denies history of End Stage Renal Disease Neurologic KENA, CRIST ([id number]) [id number].pdf Page 6 of 9 Patient has history of Neuropathy, Paraplegia Psychiatric Denies history of Anorexia/bulimia, Confinement Anxiety Medical A Surgical History Notes nd Cardiovascular atrial fibrillation Musculoskeletal arthritis Objective Constitutional no acute distress. Vitals Time Taken: 11:18 AM, Height: 77 in, Weight: 318 lbs, BMI: 37.7, Temperature: 97.5 F, Pulse: 96 bpm, Respiratory Rate: 18 breaths/min, Blood Pressure: 131/87 mmHg, Capillary Blood Glucose: 150 mg/dl. General Notes: glucose per tp reoprt this am Respiratory Normal work of breathing on room air. General Notes: 07/03/2022: The gluteal wound is a little bit smaller again today. It seems more superficial and just has a light layer of slough on the  surface. Integumentary (Hair, Skin) Wound #3 status is Open. Original cause of wound was Gradually Appeared. The date acquired was: 05/14/2021. The wound has been in treatment 15 weeks. The wound is located on the Left Gluteus. The wound measures 10cm length x 12cm width x 0.1cm depth; 94.248cm^2 area and 9.425cm^3 volume. There is Fat Layer (Subcutaneous Tissue) exposed. There is no tunneling or undermining noted. There is a medium amount of serosanguineous drainage noted. The wound margin is distinct with the outline attached to the wound base. There is large (67-100%) red, friable granulation within the wound bed. There is a small (1-33%) amount of necrotic tissue within the wound bed including Adherent Slough. The periwound skin appearance exhibited: Scarring, Dry/Scaly. The periwound skin appearance did not exhibit: Crepitus, Excoriation, Rash, Maceration, Erythema. Periwound temperature was noted as No Abnormality. The periwound has tenderness on palpation. Assessment Active Problems ICD-10 Pressure ulcer of left buttock, stage 3 Morbid (severe) obesity due to excess calories Other specified polyneuropathies Ileostomy status Type 2  diabetes mellitus with other skin ulcer Procedures Wound #3 Pre-procedure diagnosis of Wound #3 is a Pressure Ulcer located on the Left Gluteus . There was a Selective/Open Wound Non-Viable Tissue Debridement with a total area of 36 sq cm performed by Fredirick Maudlin, MD. With the following instrument(s): Curette to remove Non-Viable tissue/material. Material removed includes Slough and Biofilm and after achieving pain control using Lidocaine 4% Topical Solution. No specimens were taken. A time out was conducted at 11:45, prior to the start of the procedure. A Minimum amount of bleeding was controlled with Pressure. The procedure was tolerated well with a pain level of 4 throughout and a pain level of 3 following the procedure. Post Debridement Measurements:  10cm length x 12cm width x 0.1cm depth; 9.425cm^3 volume. Post debridement Stage noted as Category/Stage III. Character of Wound/Ulcer Post Debridement is improved. Post procedure Diagnosis Wound #3: Same as Pre-Procedure General Notes: scribed by Baruch Gouty, RN for Dr. Celine Ahr. Plan Follow-up Appointments: Return appointment in 3 weeks. - *****STRETCHER*******Dr. Alvera Singh ***Needs extra time, STRETCHER**** Anesthetic: Wound #3 Left Gluteus: JESE, BENCE ([id number]) [id number] Page 7 of 9 (In clinic) Topical Lidocaine 4% applied to wound bed Bathing/ Shower/ Hygiene: May shower and wash wound with soap and water. Off-Loading: Wound #3 Left Gluteus: Gel mattress overlay (Group 1) Turn and reposition every 2 hours The following medication(s) was prescribed: lidocaine topical 4 % cream cream topical for prior to debridement was prescribed at facility WOUND #3: - Gluteus Wound Laterality: Left Cleanser: Soap and Water 1 x Per Day/30 Days Discharge Instructions: May shower and wash wound with dial antibacterial soap and water prior to dressing change. Cleanser: Wound Cleanser (Generic) 1 x Per Day/30 Days Discharge Instructions: Cleanse the wound with wound cleanser prior to applying a clean dressing using gauze sponges, not tissue or cotton balls. Peri-Wound Care: Zinc Oxide Ointment 30g tube (Generic) 1 x Per Day/30 Days Discharge Instructions: Apply Zinc Oxide to periwound with each dressing change Prim Dressing: Sorbact Superabsorbent Dressing, 8x8 (in/in) (Generic) 1 x Per Day/30 Days ary Discharge Instructions: Needs 2 for the wound Secondary Dressing: ABD Pad, 8x10 (Generic) 1 x Per Day/30 Days Discharge Instructions: Apply over primary dressing as directed. Secured With: 1M Medipore Public affairs consultant Surgical T 2x10 (in/yd) (Generic) 1 x Per Day/30 Days ape Discharge Instructions: Secure with tape as directed. 07/03/2022: The gluteal wound is a little  bit smaller again today. It seems more superficial and just has a light layer of slough on the surface. I used a curette to debride slough and biofilm from the wound surface. He seems to be doing well with the Sorbact absorbent pad dressing so we will continue this. Follow-up in 3 weeks. Electronic Signature(s) Signed: 07/03/2022 12:11:21 PM By: Fredirick Maudlin MD FACS Entered By: Fredirick Maudlin on 07/03/2022 12:11:21 -------------------------------------------------------------------------------- HxROS Details Patient Name: Date of Service: [name]. 07/03/2022 10:30 A M Medical Record Number: [id number] Patient Account Number: [id number] Date of Birth/Sex: Treating RN: 03/05/1951 (72 y.o. M) Primary Care Provider: [name] Other Clinician: Referring Provider: Treating Provider/Extender: [name]: 15 Information Obtained From Patient Constitutional Symptoms (General Health) Medical History: Positive for: Congestive Heart Failure Eyes Medical History: Negative for: Cataracts; Glaucoma; Optic Neuritis Ear/Nose/Mouth/Throat Medical History: Negative for: Chronic sinus problems/congestion; Middle ear problems Cardiovascular Medical History: Positive for: Hypertension Past Medical History Notes: atrial fibrillation Endocrine LUCION, WEGE ([id number]) [id number] Page 8 of 9 Medical History: Positive for: Type I Diabetes Negative  for: Type 1 Diabetes Time with diabetes: less then 10 Treated with: Oral agents Genitourinary Medical History: Negative for: End Stage Renal Disease Musculoskeletal Medical History: Past Medical History Notes: arthritis Neurologic Medical History: Positive for: Neuropathy; Paraplegia Psychiatric Medical History: Negative for: Anorexia/bulimia; Confinement Anxiety Immunizations Pneumococcal Vaccine: Received Pneumococcal Vaccination: Yes Received Pneumococcal  Vaccination On or After 60th Birthday: Yes Implantable Devices None Family and Social History Cancer: Yes - Father,Mother; Diabetes: Yes - Father,Mother,Siblings; Heart Disease: Yes - Mother,Siblings; Hypertension: Yes - Father,Siblings; Kidney Disease: Yes - Siblings; Former smoker - ended on 08/10/2013; Marital Status - Single; Alcohol Use: Rarely; Drug Use: No History; Caffeine Use: Rarely; Financial Concerns: No; Food, Clothing or Shelter Needs: No; Support System Lacking: No; Transportation Concerns: No Electronic Signature(s) Signed: 07/03/2022 12:40:26 PM By: Fredirick Maudlin MD FACS Entered By: Fredirick Maudlin on 07/03/2022 12:10:12 -------------------------------------------------------------------------------- SuperBill Details Patient Name: Date of Service: [name] 07/03/2022 Medical Record Number: [id number] Patient Account Number: [id number] Date of Birth/Sex: Treating RN: 07-19-1950 (72 y.o. 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 mellitus with other skin ulcer Facility Procedures JETT, DIVENS ([id number]): CPT4 Code Description [id number] - DEBRIDE WOUND 1ST 20 SQ CM OR < ICD-10 Diagnosis Description L89.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, stage 3 [phone].pdf Page 9 of 9: Modifier Quantity 1 DUVON, NATIONS ([id number]): [id number] - DEBRIDE WOUND EA ADDL 20 SQ CM ICD-10 Diagnosis Description L89.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, stage 3 [phone].pdf Page 9 of 9: 1 Physician Procedures : CPT4 Code Description Modifier [id number] - WC PHYS LEVEL 3  - EST PT 25 ICD-10 Diagnosis Description L89.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, 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 G62.89 Other specified polyneuropathies  E66.01 Morbid (severe) obesity due to excess calories Quantity: 1 : [id number] - WC PHYS DEBR WO ANESTH 20 SQ CM ICD-10 Diagnosis Description L89.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, stage 3 Quantity: 1 : [id number] - WC PHYS DEBR WO ANESTH EA ADD 20 CM ICD-10 Diagnosis Description L89.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, stage 3 Quantity: 1 Electronic Signature(s) Signed: 07/03/2022 12:13:00 PM By: Fredirick Maudlin MD FACS Entered By: Fredirick Maudlin on 07/03/2022 12:13:00

## 2022-07-04 NOTE — Progress Notes (Signed)
[name], [name] ([id number]) [id number] Page 1 of 7 Visit Report for 07/03/2022 Arrival Information Details Patient Name: Date of Service: [name] 07/03/2022 10:30 A M Medical Record Number: [id number] Patient Account Number: [id number] Date of Birth/Sex: Treating RN: [birth year] (72 y.o. Ernestene Mention Primary Care Coleman Kalas: [name] Other Clinician: Referring Dawsyn Ramsaran: Treating Despina Boan/Extender: [name]: 15 Visit Information History Since Last Visit Added or deleted any medications: No Patient Arrived: Stretcher Any new allergies or adverse reactions: No Arrival Time: 11:12 Had a fall or experienced change in No Accompanied By: EMS activities of daily living that may affect Transfer Assistance: Stretcher risk of falls: Patient Identification Verified: Yes Signs or symptoms of abuse/neglect since last visito No Secondary Verification Process Completed: Yes Hospitalized since last visit: No Patient Requires Transmission-Based Precautions: No Implantable device outside of the clinic excluding No cellular tissue based products placed in the center since last visit: Has Dressing in Place as Prescribed: Yes Pain Present Now: Yes Electronic Signature(s) Signed: 07/03/2022 5:03:11 PM By: Baruch Gouty RN, BSN Entered By: Baruch Gouty on 07/03/2022 11:17:38 -------------------------------------------------------------------------------- Encounter Discharge Information Details Patient Name: Date of Service: [name]. 07/03/2022 10:30 A M Medical Record Number: [id number] Patient Account Number: [id number] Date of Birth/Sex: Treating RN: [birth year] (72 y.o. Ernestene Mention Primary Care Daryana Whirley: [name] Other Clinician: Referring Tykeisha Peer: Treating Layia Walla/Extender: [name]: 15 Encounter Discharge Information Items Post Procedure  Vitals Discharge Condition: Stable Temperature (F): 97.5 Ambulatory Status: Stretcher Pulse (bpm): 96 Discharge Destination: Home Respiratory Rate (breaths/min): 18 Transportation: Ambulance Blood Pressure (mmHg): 131/87 Accompanied By: sister Schedule Follow-up Appointment: Yes Clinical Summary of Care: Patient Declined Electronic Signature(s) Signed: 07/03/2022 5:03:11 PM By: Baruch Gouty RN, BSN Entered By: Baruch Gouty on 07/03/2022 12:07:37 [name] ([id number].pdf Page 2 of 7 -------------------------------------------------------------------------------- Lower Extremity Assessment Details Patient Name: Date of Service: [name] 07/03/2022 10:30 A M Medical Record Number: [id number] Patient Account Number: [id number] Date of Birth/Sex: Treating RN: 10-16-1950 (71 y.o. Ernestene Mention Primary Care Satina Jerrell: [name] Other Clinician: Referring Alexee Delsanto: Treating Suhaas Agena/Extender: [name]: 15 Electronic Signature(s) Signed: 07/03/2022 5:03:11 PM By: Baruch Gouty RN, BSN Entered By: Baruch Gouty on 07/03/2022 11:27:08 -------------------------------------------------------------------------------- Multi Wound Chart Details Patient Name: Date of Service: [name]. 07/03/2022 10:30 A M Medical Record Number: [id number] Patient Account Number: [id number] Date of Birth/Sex: Treating RN: 10/12/1950 (72 y.o. M) Primary Care Aveline Daus: [name] Other Clinician: Referring Gurpreet Mikhail: Treating Sophy Mesler/Extender: [name]: 15 Vital Signs Height(in): 77 Capillary Blood Glucose(mg/dl): 150 Weight(lbs): 318 Pulse(bpm): 96 Body Mass Index(BMI): 37.7 Blood Pressure(mmHg): 131/87 Temperature(F): 97.5 Respiratory Rate(breaths/min): 18 [3:Photos:] [N/A:N/A] Left Gluteus N/A N/A Wound Location: Gradually Appeared N/A  N/A Wounding Event: Pressure Ulcer N/A N/A Primary Etiology: Congestive Heart Failure, N/A N/A Comorbid History: Hypertension, Type I Diabetes, Neuropathy, Paraplegia 05/14/2021 N/A N/A Date Acquired: 15 N/A N/A Weeks of Treatment: Open N/A N/A Wound Status: No N/A N/A Wound Recurrence: 10x12x0.1 N/A N/A Measurements L x W x D (cm) 94.248 N/A N/A A (cm) : rea 9.425 N/A N/A Volume (cm) : 43.50% N/A N/A % Reduction in A rea: 43.50% N/A N/A % Reduction in Volume: Category/Stage III N/A N/A Classification: Medium N/A N/A Exudate A mount: Serosanguineous N/A N/A Exudate Type: red, brown N/A N/A Exudate Color: Distinct, outline attached N/A N/A Wound Margin: Large (  67-100%) N/A N/A Granulation A mount: Red, Friable N/A N/A Granulation QualityDARRIN, DERVISEVIC ([id number]) [phone].pdf Page 3 of 7 Small (1-33%) N/A N/A Necrotic Amount: Fat Layer (Subcutaneous Tissue): Yes N/A N/A Exposed Structures: Fascia: No Tendon: No Muscle: No Joint: No Bone: No Small (1-33%) N/A N/A Epithelialization: Debridement - Selective/Open Wound N/A N/A Debridement: Pre-procedure Verification/Time Out 11:45 N/A N/A Taken: Lidocaine 4% Topical Solution N/A N/A Pain Control: Slough N/A N/A Tissue Debrided: Non-Viable Tissue N/A N/A Level: 36 N/A N/A Debridement A (sq cm): rea Curette N/A N/A Instrument: Minimum N/A N/A Bleeding: Pressure N/A N/A Hemostasis A chieved: 4 N/A N/A Procedural Pain: 3 N/A N/A Post Procedural Pain: Procedure was tolerated well N/A N/A Debridement Treatment Response: 10x12x0.1 N/A N/A Post Debridement Measurements L x W x D (cm) 9.425 N/A N/A Post Debridement Volume: (cm) Category/Stage III N/A N/A Post Debridement Stage: Scarring: Yes N/A N/A Periwound Skin Texture: Excoriation: No Crepitus: No Rash: No Dry/Scaly: Yes N/A N/A Periwound Skin Moisture: Maceration: No Erythema: No N/A N/A Periwound Skin  Color: No Abnormality N/A N/A Temperature: Yes N/A N/A Tenderness on Palpation: Debridement N/A N/A Procedures Performed: Treatment Notes Wound #3 (Gluteus) Wound Laterality: Left Cleanser Soap and Water Discharge Instruction: May shower and wash wound with dial antibacterial soap and water prior to dressing change. Wound Cleanser Discharge Instruction: Cleanse the wound with wound cleanser prior to applying a clean dressing using gauze sponges, not tissue or cotton balls. Peri-Wound Care Zinc Oxide Ointment 30g tube Discharge Instruction: Apply Zinc Oxide to periwound with each dressing change Topical Primary Dressing Sorbact Superabsorbent Dressing, 8x8 (in/in) Discharge Instruction: Needs 2 for the wound Secondary Dressing ABD Pad, 8x10 Discharge Instruction: Apply over primary dressing as directed. Secured With [employer] Surgical T 2x10 (in/yd) ape Discharge Instruction: Secure with tape as directed. Compression Wrap Compression Stockings Add-Ons Electronic Signature(s) Signed: 07/03/2022 12:06:42 PM By: Fredirick Maudlin MD FACS Entered By: Fredirick Maudlin on 07/03/2022 12:06:41 [name] ([id number].pdf Page 4 of 7 -------------------------------------------------------------------------------- Multi-Disciplinary Care Plan Details Patient Name: Date of Service: [name] 07/03/2022 10:30 A M Medical Record Number: [id number] Patient Account Number: [id number] Date of Birth/Sex: Treating RN: Feb 17, 1951 (72 y.o. Ernestene Mention Primary Care Himmat Enberg: [name] Other Clinician: Referring Dajuana Palen: Treating Idus Rathke/Extender: [name]: [place] reviewed with physician Active Inactive Nutrition Nursing Diagnoses: Potential for alteratiion in Nutrition/Potential for imbalanced nutrition Goals: Patient/caregiver verbalizes understanding of  need to maintain therapeutic glucose control per primary care physician Date Initiated: 03/19/2022 Target Resolution Date: 11/08/2022 Goal Status: Active Interventions: Provide education on elevated blood sugars and impact on wound healing Treatment Activities: Dietary management education, guidance and counseling : 03/19/2022 Notes: Wound/Skin Impairment Nursing Diagnoses: Knowledge deficit related to ulceration/compromised skin integrity Goals: Ulcer/skin breakdown will have a volume reduction of 30% by week 4 Date Initiated: 03/19/2022 Target Resolution Date: 11/08/2022 Goal Status: Active Interventions: Assess ulceration(s) every visit Provide education on ulcer and skin care Treatment Activities: Skin care regimen initiated : 03/19/2022 Notes: Electronic Signature(s) Signed: 07/03/2022 5:03:11 PM By: Baruch Gouty RN, BSN Entered By: Baruch Gouty on 07/03/2022 11:34:58 -------------------------------------------------------------------------------- Pain Assessment Details Patient Name: Date of Service: [name]. 07/03/2022 10:30 A M Medical Record Number: [id number] Patient Account Number: [id number] Date of Birth/Sex: Treating RN: July 18, 1950 (72 y.o. Ernestene Mention Primary Care Krrish Freund: [name] Other Clinician: Referring Adamarys Shall: Treating Shermon Bozzi/Extender: [name]: [phone]  [address] BAL[place], DEBLANC ([id number]) [id number] Page 5 of 7 Location of Pain Severity and Description of Pain Patient Has Paino Yes Site Locations Pain Location: Pain in Ulcers With Dressing Change: Yes Duration of the Pain. Constant / Intermittento Constant Rate the pain. Current Pain Level: 8 Worst Pain Level: 10 Character of Pain Describe the Pain: Aching, Other: sore Pain Management and Medication Current Pain Management: Medication: Yes Other: reposition Electronic Signature(s) Signed: 07/03/2022  5:03:11 PM By: Baruch Gouty RN, BSN Entered By: Baruch Gouty on 07/03/2022 11:19:51 -------------------------------------------------------------------------------- Patient/Caregiver Education Details Patient Name: Date of Service: [name] 2/22/2024andnbsp10:30 A M Medical Record Number: [id number] Patient Account Number: [id number] Date of Birth/Gender: Treating RN: March 31, 1951 (72 y.o. Ernestene Mention Primary Care Physician: [name] Other Clinician: Referring Physician: Treating Physician/Extender: [name]: 15 Education Assessment Education Provided To: Patient Education Topics Provided Pressure: Methods: Explain/Verbal Responses: Reinforcements needed, State content correctly Wound/Skin Impairment: Methods: Explain/Verbal Responses: Reinforcements needed, State content correctly Electronic Signature(s) Signed: 07/03/2022 5:03:11 PM By: Baruch Gouty RN, BSN Entered By: Baruch Gouty on 07/03/2022 11:35:23 [name] ([id number].pdf Page 6 of 7 -------------------------------------------------------------------------------- Wound Assessment Details Patient Name: Date of Service: [name] 07/03/2022 10:30 A M Medical Record Number: [id number] Patient Account Number: [id number] Date of Birth/Sex: Treating RN: 01/28/1951 (72 y.o. Ernestene Mention Primary Care Karon Heckendorn: [name] Other Clinician: Referring Skyylar Kopf: Treating Sumiko Ceasar/Extender: [name]: 15 Wound Status Wound Number: 3 Primary Pressure Ulcer Etiology: Wound Location: Left Gluteus Wound Open Wounding Event: Gradually Appeared Status: Date Acquired: 05/14/2021 Comorbid Congestive Heart Failure, Hypertension, Type I Diabetes, Weeks Of Treatment: 15 History: Neuropathy, Paraplegia Clustered Wound: No Photos Wound Measurements Length: (cm) 10 Width:  (cm) 12 Depth: (cm) 0.1 Area: (cm) 94.248 Volume: (cm) 9.425 % Reduction in Area: 43.5% % Reduction in Volume: 43.5% Epithelialization: Small (1-33%) Tunneling: No Undermining: No Wound Description Classification: Category/Stage III Wound Margin: Distinct, outline attached Exudate Amount: Medium Exudate Type: Serosanguineous Exudate Color: red, brown Foul Odor After Cleansing: No Slough/Fibrino Yes Wound Bed Granulation Amount: Large (67-100%) Exposed Structure Granulation Quality: Red, Friable Fascia Exposed: No Necrotic Amount: Small (1-33%) Fat Layer (Subcutaneous Tissue) Exposed: Yes Necrotic Quality: Adherent Slough Tendon Exposed: No Muscle Exposed: No Joint Exposed: No Bone Exposed: No Periwound Skin Texture Texture Color No Abnormalities Noted: No No Abnormalities Noted: No Crepitus: No Erythema: No Excoriation: No Temperature / Pain Rash: No Temperature: No Abnormality Scarring: Yes Tenderness on Palpation: Yes Moisture No Abnormalities Noted: No Dry / Scaly: Yes Maceration: No Treatment Notes BREYLEN, ANTILLA ([id number]) [phone].pdf Page 7 of 7 Wound #3 (Gluteus) Wound Laterality: Left Cleanser Soap and Water Discharge Instruction: May shower and wash wound with dial antibacterial soap and water prior to dressing change. Wound Cleanser Discharge Instruction: Cleanse the wound with wound cleanser prior to applying a clean dressing using gauze sponges, not tissue or cotton balls. Peri-Wound Care Zinc Oxide Ointment 30g tube Discharge Instruction: Apply Zinc Oxide to periwound with each dressing change Topical Primary Dressing Sorbact Superabsorbent Dressing, 8x8 (in/in) Discharge Instruction: Needs 2 for the wound Secondary Dressing ABD Pad, 8x10 Discharge Instruction: Apply over primary dressing as directed. Secured With [employer] Surgical T 2x10 (in/yd) ape Discharge Instruction: Secure with tape as  directed. Compression Wrap Compression Stockings Add-Ons Electronic Signature(s) Signed: 07/03/2022 5:03:11 PM By: Baruch Gouty RN, BSN Entered By: Baruch Gouty on 07/03/2022 11:34:28 -------------------------------------------------------------------------------- Vitals Details  Patient Name: Date of Service: [name] 07/03/2022 10:30 A M Medical Record Number: [id number] Patient Account Number: [id number] Date of Birth/Sex: Treating RN: 11/11/1950 (72 y.o. Ernestene Mention Primary Care Shterna Laramee: [name] Other Clinician: Referring Dareon Nunziato: Treating Jhayden Demuro/Extender: [name]: 15 Vital Signs Time Taken: 11:18 Temperature (F): 97.5 Height (in): 77 Pulse (bpm): 96 Weight (lbs): 318 Respiratory Rate (breaths/min): 18 Body Mass Index (BMI): 37.7 Blood Pressure (mmHg): 131/87 Capillary Blood Glucose (mg/dl): 150 Reference Range: 80 - 120 mg / dl Notes glucose per tp reoprt this am Electronic Signature(s) Signed: 07/03/2022 5:03:11 PM By: Baruch Gouty RN, BSN Entered By: Baruch Gouty on 07/03/2022 11:19:25

## 2022-07-25 ENCOUNTER — Encounter (HOSPITAL_BASED_OUTPATIENT_CLINIC_OR_DEPARTMENT_OTHER): Payer: Medicare Other | Admitting: Internal Medicine

## 2022-08-07 ENCOUNTER — Encounter (HOSPITAL_BASED_OUTPATIENT_CLINIC_OR_DEPARTMENT_OTHER): Payer: Medicare Other | Attending: General Surgery | Admitting: General Surgery

## 2022-08-07 DIAGNOSIS — G6289 Other specified polyneuropathies: Secondary | ICD-10-CM | POA: Diagnosis not present

## 2022-08-07 DIAGNOSIS — G8220 Paraplegia, unspecified: Secondary | ICD-10-CM | POA: Insufficient documentation

## 2022-08-07 DIAGNOSIS — I509 Heart failure, unspecified: Secondary | ICD-10-CM | POA: Insufficient documentation

## 2022-08-07 DIAGNOSIS — I110 Hypertensive heart disease with heart failure: Secondary | ICD-10-CM | POA: Diagnosis not present

## 2022-08-07 DIAGNOSIS — E1042 Type 1 diabetes mellitus with diabetic polyneuropathy: Secondary | ICD-10-CM | POA: Insufficient documentation

## 2022-08-07 DIAGNOSIS — Z87891 Personal history of nicotine dependence: Secondary | ICD-10-CM | POA: Diagnosis not present

## 2022-08-07 DIAGNOSIS — Z933 Colostomy status: Secondary | ICD-10-CM | POA: Insufficient documentation

## 2022-08-07 DIAGNOSIS — E11622 Type 2 diabetes mellitus with other skin ulcer: Secondary | ICD-10-CM | POA: Diagnosis present

## 2022-08-07 DIAGNOSIS — E10622 Type 1 diabetes mellitus with other skin ulcer: Secondary | ICD-10-CM | POA: Diagnosis not present

## 2022-08-07 DIAGNOSIS — Z6837 Body mass index (BMI) 37.0-37.9, adult: Secondary | ICD-10-CM | POA: Insufficient documentation

## 2022-08-07 DIAGNOSIS — L89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, stage 3: Secondary | ICD-10-CM | POA: Diagnosis not present

## 2022-08-07 DIAGNOSIS — I4891 Unspecified atrial fibrillation: Secondary | ICD-10-CM | POA: Diagnosis not present

## 2022-08-08 NOTE — Progress Notes (Signed)
HAMILTON, HEBERLEIN ([id number]) [id number] Page 1 of 8 Visit Report for 08/07/2022 Chief Complaint Document Details Patient Name: Date of Service: [name] 08/07/2022 10:00 A M Medical Record Number: [id number] Patient Account Number: [id number] Date of Birth/Sex: Treating RN: Dec 18, 1950 (72 y.o. Male) Primary Care Provider: [name] Other Clinician: Referring Provider: Treating Provider/Extender: [name]: 20 Information Obtained from: Patient Chief Complaint Here for follow up abdominal wound present following laparotomy, colostomy. 03/19/2022: Here for pressure ulcer on left gluteus and ulcer adjacent to ileostomy Electronic Signature(s) Signed: 08/07/2022 10:43:44 AM By: Fredirick Maudlin MD FACS Entered By: Fredirick Maudlin on 08/07/2022 10:43:44 -------------------------------------------------------------------------------- Debridement Details Patient Name: Date of Service: [name]. 08/07/2022 10:00 [place] Record Number: [id number] Patient Account Number: [id number] Date of Birth/Sex: Treating RN: May 02, 1951 (72 y.o. Male) Adline Peals Primary Care Provider: [name] Other Clinician: Referring Provider: Treating Provider/Extender: [name]: 20 Debridement Performed for Assessment: Wound #3 Left Gluteus Performed By: Physician Fredirick Maudlin, MD Debridement Type: Debridement Level of Consciousness (Pre-procedure): Awake and Alert Pre-procedure Verification/Time Out Yes - 10:34 Taken: Start Time: 10:34 Pain Control: Lidocaine 4% T opical Solution T Area Debrided (L x W): otal 4 (cm) x 4 (cm) = 16 (cm) Tissue and other material debrided: Non-Viable, Slough, Skin: Epidermis, Slough Level: Skin/Epidermis Debridement Description: Selective/Open Wound Instrument: Curette Bleeding: Minimum Hemostasis Achieved: Pressure Response to  Treatment: Procedure was tolerated well Level of Consciousness (Post- Awake and Alert procedure): Post Debridement Measurements of Total Wound Length: (cm) 15.4 Stage: Category/Stage III Width: (cm) 16.1 Depth: (cm) 0.1 Volume: (cm) 19.473 Character of Wound/Ulcer Post Debridement: Improved Post Procedure Diagnosis Same as Pre-procedure Notes scribed for Dr. Celine Ahr by Adline Peals, RN Electronic Signature(s) Signed: 08/07/2022 11:00:06 AM By: Fredirick Maudlin MD FACS Signed: 08/07/2022 4:40:02 PM By: Adline Peals Entered By: Adline Peals on 08/07/2022 10:36:19 [name] ([id number] Page 2 of 8 -------------------------------------------------------------------------------- HPI Details Patient Name: Date of Service: [name] 08/07/2022 10:00 A M Medical Record Number: [id number] Patient Account Number: [id number] Date of Birth/Sex: Treating RN: [birth year] (72 y.o. Male) Primary Care Provider: [name] Other Clinician: Referring Provider: Treating Provider/Extender: [name]: 20 History of Present Illness HPI Description: On medihoney with border dressing due to stalling on collagen and concern ABD was pulling off skin. 09/11/14 Nearly healed, counseled given the thin attenuated scar skin I expect will have some recurrence wounds with slight trauma or maceration, however much improved today. cont ABD pads and medihoney. 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 He resides with his sister and is on a regular hospital bed. The 2 of  them reported that for about the last year, he has had ulceration adjacent to his ileostomy as well as on his left gluteus and upper posterior thigh. Most recent hemoglobin A1c was 6.3%. On his left gluteus, there is a stage II pressure ulcer with surrounding tissue maceration and excoriation. There is a little slough on the wound surface. Adjacent to his ileostomy, associated with his appliance, there is an ulcer that extends into the fat layer. It is clean without any slough or necrotic  tissue. 04/10/2022: The left gluteal ulcer has expanded and is quite a bit larger today. There is slough and eschar accumulation. The patient's sister reports that the ileostomy associated wound got better and so they did not go to see the outpatient ostomy nurse. They have not received the low-air-loss mattress. 05/01/2022: Apparently the low-air-loss mattress has been denied by his insurance, but they are willing to cover a gel mattress overlay. The ileostomy-area wound has contracted further, per the patient's sister; we are not managing this wound at this time. The left gluteal ulcer seems to be about the same size, but there is extensive slough on all of the open surfaces. His skin is extremely friable. 05/22/2022: The gel mattress overlay was finally delivered last week. The left gluteal ulcer has deteriorated somewhat. There is slough on all of the open surfaces and the total area of the wound is larger. It has a funky odor to it today. 06/13/2022: The gluteal wound is smaller and has substantially less slough. Epithelium is filling in around the margins. The odor that was present at his last visit has dissipated. 07/03/2022: The gluteal wound is a little bit smaller again today. It seems more superficial and just has a light layer of slough on the surface. 08/07/2022: No significant change to the gluteal wound. The patient's sister does report that it has bled less and seems less friable. Electronic  Signature(s) Signed: 08/07/2022 10:44:13 AM By: Fredirick Maudlin MD FACS Entered By: Fredirick Maudlin on 08/07/2022 10:44:13 -------------------------------------------------------------------------------- Physical Exam Details Patient Name: Date of Service: [name] 08/07/2022 10:00 A M Medical Record Number: [id number] Patient Account Number: [id number] Date of Birth/Sex: Treating RN: [birth year] (72 y.o. Male) Primary Care Provider: [name] Other Clinician: Referring Provider: Treating Provider/Extender: [name]: 20 Constitutional . . . . no acute distress. Respiratory . Notes 08/07/2022: No significant change to the gluteal wound. The patient's sister does report that it has bled less and seems less friable. Electronic Signature(s) Signed: 08/07/2022 10:44:42 AM By: Fredirick Maudlin MD FACS Entered By: Fredirick Maudlin on 08/07/2022 10:44:41 [name] ([id number].pdf Page 3 of 8 -------------------------------------------------------------------------------- Physician Orders Details Patient Name: Date of Service: [name] 08/07/2022 10:00 A M Medical Record Number: [id number] Patient Account Number: [id number] Date of Birth/Sex: Treating RN: [birth year] (72 y.o. 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 - *****STRETCHER*******Dr. Celine Ahr Rm 3 Anesthetic Wound #3 Left Gluteus (In clinic)  Topical Lidocaine 4% applied to wound bed Bathing/ Shower/ Hygiene May shower and wash wound with soap and water. Off-Loading Wound #3 Left Gluteus Gel mattress overlay (Group 1) Turn and reposition every 2 hours Wound Treatment Wound #3 - Gluteus Wound Laterality: Left Cleanser: Soap and Water 1 x Per Day/30 Days Discharge Instructions: May shower and wash wound with dial antibacterial soap and water prior to dressing change. Cleanser: Wound Cleanser (Generic) 1 x Per Day/30 Days Discharge Instructions: Cleanse the wound with wound cleanser prior to applying a clean dressing using gauze sponges, not tissue or cotton balls. Peri-Wound Care: Zinc Oxide Ointment 30g tube (Generic) 1  x Per Day/30 Days Discharge Instructions: Apply Zinc Oxide to periwound with each dressing change Topical: Gentamicin 1 x Per Day/30 Days Discharge Instructions: As directed by physician Prim Dressing: Sorbact Superabsorbent Dressing, 8x8 (in/in) (Generic) 1 x Per Day/30 Days ary Discharge Instructions: Needs 2 for the wound Secondary Dressing: ABD Pad, 8x10 (Generic) 1 x Per Day/30 Days Discharge Instructions: Apply over primary dressing as directed. Secured With: 36M Medipore Public affairs consultant Surgical T 2x10 (in/yd) (Generic) 1 x Per Day/30 Days ape Discharge Instructions: Secure with tape as directed. Patient Medications llergies: penicillin A Notifications Medication Indication Start End 08/07/2022 lidocaine DOSE topical 4 % cream - cream topical 08/07/2022 gentamicin DOSE topical 0.1 % ointment - Apply thin layer to wound surface with dressing changes Electronic Signature(s) GUNAR, TRIMMER ([id number]) [id number] Page 4 of 8 Signed: 08/07/2022 11:00:06 AM By: Fredirick Maudlin MD FACS Previous Signature: 08/07/2022 10:45:39 AM Version By: Fredirick Maudlin MD FACS Entered By: Fredirick Maudlin on 08/07/2022  10:45:53 -------------------------------------------------------------------------------- Problem List Details Patient Name: Date of Service: [name]. 08/07/2022 10:00 A M Medical Record Number: [id number] Patient Account Number: [id number] Date of Birth/Sex: Treating RN: 07/26/1950 (72 y.o. Male) Primary Care Provider: [name] Other Clinician: Referring Provider: Treating Provider/Extender: [name]: 20 Active Problems ICD-10 Encounter Code Description Active Date MDM Diagnosis [phone] Pressure ulcer of left buttock, stage 3 03/19/2022 No Yes E66.01 Morbid (severe) obesity due to excess calories 03/19/2022 No Yes G62.89 Other specified polyneuropathies 03/19/2022 No Yes Z93.2 Ileostomy status 03/19/2022 No Yes E11.622 Type 2 diabetes mellitus with other skin ulcer 03/19/2022 No Yes Inactive Problems ICD-10 Code Description Active Date Inactive Date L98.492 Non-pressure chronic ulcer of skin of other sites with fat layer exposed 03/19/2022 03/19/2022 Resolved Problems Electronic Signature(s) Signed: 08/07/2022 10:42:37 AM By: Fredirick Maudlin MD FACS Entered By: Fredirick Maudlin on 08/07/2022 10:42:37 -------------------------------------------------------------------------------- Progress Note Details Patient Name: Date of Service: [name]. 08/07/2022 10:00 [place] Record Number: [id number] Patient Account Number: [id number] Date of Birth/Sex: Treating RN: 11-28-1950 (72 y.o. Male) Primary Care Provider: [name] Other Clinician: Referring Provider: Treating Provider/Extender: [name]: 20 Subjective Chief Complaint Information obtained from Patient Here for follow up abdominal wound present following laparotomy, colostomy. 03/19/2022: Here for pressure ulcer on left gluteus and ulcer adjacent to ileostomy CHRISTINE, SCHWEITZER ([id number]) [id number]  Page 5 of 8 History of Present Illness (HPI) On medihoney with border dressing due to stalling on collagen and concern ABD was pulling off skin. 09/11/14 Nearly healed, counseled given the thin attenuated scar skin I expect will have some recurrence wounds with slight trauma or maceration, however much improved today. cont ABD pads and medihoney. 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 He resides with his sister and is on a regular hospital bed. The 2 of them reported that for about the last year, he has had ulceration adjacent to his ileostomy as well as on his left gluteus and upper posterior thigh. Most recent hemoglobin A1c was 6.3%. On his left gluteus, there is a stage II pressure ulcer with surrounding tissue maceration and excoriation. There is a little slough on the wound surface. Adjacent to his ileostomy,  associated with his appliance, there is an ulcer that extends into the fat layer. It is clean without any slough or necrotic tissue. 04/10/2022: The left gluteal ulcer has expanded and is quite a bit larger today. There is slough and eschar accumulation. The patient's sister reports that the ileostomy associated wound got better and so they did not go to see the outpatient ostomy nurse. They have not received the low-air-loss mattress. 05/01/2022: Apparently the low-air-loss mattress has been denied by his insurance, but they are willing to cover a gel mattress overlay. The ileostomy-area wound has contracted further, per the patient's sister; we are not managing this wound at this time. The left gluteal ulcer seems to be about the same size,  but there is extensive slough on all of the open surfaces. His skin is extremely friable. 05/22/2022: The gel mattress overlay was finally delivered last week. The left gluteal ulcer has deteriorated somewhat. There is slough on all of the open surfaces and the total area of the wound is larger. It has a funky odor to it today. 06/13/2022: The gluteal wound is smaller and has substantially less slough. Epithelium is filling in around the margins. The odor that was present at his last visit has dissipated. 07/03/2022: The gluteal wound is a little bit smaller again today. It seems more superficial and just has a light layer of slough on the surface. 08/07/2022: No significant change to the gluteal wound. The patient's sister does report that it has bled less and seems less friable. Patient History Information obtained from Patient. Family History Cancer - Father,Mother, Diabetes - Father,Mother,Siblings, Heart Disease - [place], Hypertension - Father,Siblings, Kidney Disease - Siblings. Social History Former smoker - ended on 08/10/2013, Marital Status - Single, Alcohol Use - Rarely, Drug Use - No History, Caffeine Use - Rarely. Medical History Constitutional Symptoms (General Health) Patient has history of Congestive Heart Failure Eyes Denies history of Cataracts, Glaucoma, Optic Neuritis Ear/Nose/Mouth/Throat Denies history of Chronic sinus problems/congestion, Middle ear problems Cardiovascular Patient has history of Hypertension Endocrine Patient has history of Type I Diabetes Denies history of Type 1 Diabetes Genitourinary Denies history of End Stage Renal Disease Neurologic Patient has history of Neuropathy, Paraplegia Psychiatric Denies history of Anorexia/bulimia, Confinement Anxiety Medical A Surgical History Notes nd Cardiovascular atrial fibrillation Musculoskeletal arthritis Objective Constitutional no acute distress. Vitals Time Taken: 9:59 AM, Height: 77 in,  Weight: 318 lbs, BMI: 37.7, Temperature: 97.9 F, Pulse: 85 bpm, Respiratory Rate: 18 breaths/min, Blood Pressure: 137/80 mmHg, Capillary Blood Glucose: 157 mg/dl. General Notes: 08/07/2022: No significant change to the gluteal wound. The patient's sister does report that it has bled less and seems less friable. BURNET, ALEN ([id number]) [id number] Page 6 of 8 Integumentary (Hair, Skin) Wound #3 status is Open. Original cause of wound was Gradually Appeared. The date acquired was: 05/14/2021. The wound has been in treatment 20 weeks. The wound is located on the Left Gluteus. The wound measures 15.4cm length x 16.1cm width x 0.1cm depth; 194.732cm^2 area and 19.473cm^3 volume. There is Fat Layer (Subcutaneous Tissue) exposed. There is no tunneling or undermining noted. There is a medium amount of serosanguineous drainage noted. The wound margin is distinct with the outline attached to the wound base. There is large (67-100%) red, friable granulation within the wound bed. There is a small (1- 33%) amount of necrotic tissue within the wound bed including Adherent Slough. The periwound skin appearance exhibited: Scarring, Dry/Scaly. The periwound skin appearance did not exhibit: Crepitus, Excoriation, Rash, Maceration,  Erythema. Periwound temperature was noted as No Abnormality. The periwound has tenderness on palpation. 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 There was a Selective/Open Wound Skin/Epidermis Debridement with a total area of 16 sq cm performed by Fredirick Maudlin, MD. With the following instrument(s): Curette to remove Non-Viable tissue/material. Material removed includes  [facility] and Skin: Epidermis and after achieving pain control using Lidocaine 4% T opical Solution. No specimens were taken. A time out was conducted at 10:34, prior to the start of the procedure. A Minimum amount of bleeding was controlled with Pressure. The procedure was tolerated well. Post Debridement Measurements: 15.4cm length x 16.1cm width x 0.1cm depth; 19.473cm^3 volume. Post debridement Stage noted as Category/Stage III. Character of Wound/Ulcer Post Debridement is improved. Post procedure Diagnosis Wound #3: Same as Pre-Procedure General Notes: scribed for Dr. Celine Ahr by Adline Peals, RN. Plan Follow-up Appointments: Return appointment in 1 month. - *****STRETCHER*******Dr. Celine Ahr Rm 3 Anesthetic: Wound #3 Left Gluteus: (In clinic) Topical Lidocaine 4% applied to wound bed Bathing/ Shower/ Hygiene: May shower and wash wound with soap and water. Off-Loading: Wound #3 Left Gluteus: Gel mattress overlay (Group 1) Turn and reposition every 2 hours The following medication(s) was prescribed: lidocaine topical 4 % cream cream topical was prescribed at facility gentamicin topical 0.1 % ointment Apply thin layer to wound surface with dressing changes starting 08/07/2022 WOUND #3: - Gluteus Wound Laterality: Left Cleanser: Soap and Water 1 x Per Day/30 Days Discharge Instructions: May shower and wash wound with dial antibacterial soap and water prior to dressing change. Cleanser: Wound Cleanser (Generic) 1 x Per Day/30 Days Discharge Instructions: Cleanse the wound with wound cleanser prior to applying a clean dressing using gauze sponges, not tissue or cotton balls. Peri-Wound Care: Zinc Oxide Ointment 30g tube (Generic) 1 x Per Day/30 Days Discharge Instructions: Apply Zinc Oxide to periwound with each dressing change Topical: Gentamicin 1 x Per Day/30 Days Discharge Instructions: As directed by physician Prim Dressing: Sorbact Superabsorbent Dressing, 8x8 (in/in) (Generic) 1 x Per  Day/30 Days ary Discharge Instructions: Needs 2 for the wound Secondary Dressing: ABD Pad, 8x10 (Generic) 1 x Per Day/30 Days Discharge Instructions: Apply over primary dressing as directed. Secured With: 67M Medipore Public affairs consultant Surgical T 2x10 (in/yd) (Generic) 1 x Per Day/30 Days ape Discharge Instructions: Secure with tape as directed. 08/07/2022: No significant change to the gluteal wound. The patient's sister does report that it has bled less and seems less friable. I used a curette to debride slough and dry skin from his wound. Due to the minimal change in his wound, I am going to add a little topical gentamicin to the site. We will continue to use Sorbact. Offloading instructions reviewed. Follow-up in 1 month. SUKHMAN, GADDIS ([id number]) [id number] Page 7 of 8 Electronic Signature(s) Signed: 08/07/2022 10:46:35 AM By: Fredirick Maudlin MD FACS Entered By: Fredirick Maudlin on 08/07/2022 10:46:35 -------------------------------------------------------------------------------- HxROS Details Patient Name: Date of Service: [name]. 08/07/2022 10:00 A M Medical Record Number: [id number] Patient Account Number: [id number] Date of Birth/Sex: Treating RN: 1950-08-21 (72 y.o. Male) Primary Care Provider: [name] Other Clinician: Referring Provider: Treating Provider/Extender: [name]: 20 Information Obtained From Patient Constitutional Symptoms (General Health) Medical History: Positive  for: Congestive Heart Failure Eyes Medical History: Negative for: Cataracts; Glaucoma; Optic Neuritis Ear/Nose/Mouth/Throat Medical History: Negative for: Chronic sinus problems/congestion; Middle ear problems Cardiovascular Medical History: Positive for: Hypertension Past Medical History Notes: atrial fibrillation Endocrine Medical History: Positive for: Type I Diabetes Negative for: Type 1 Diabetes Time with  diabetes: less then 10 Treated with: Oral agents Genitourinary Medical History: Negative for: End Stage Renal Disease Musculoskeletal Medical History: Past Medical History Notes: arthritis Neurologic Medical History: Positive for: Neuropathy; Paraplegia Psychiatric Medical History: Negative for: Anorexia/bulimia; Confinement Anxiety Immunizations Pneumococcal Vaccine: Received Pneumococcal Vaccination: Yes Received Pneumococcal Vaccination On or After 60th Birthday: Yes Implantable [address] [place], [place] ([id number]) [phone].pdf Page 8 of 8 None Family and Social History Cancer: Yes - Father,Mother; Diabetes: Yes - Father,Mother,Siblings; Heart Disease: Yes - Mother,Siblings; Hypertension: Yes - Father,Siblings; Kidney Disease: Yes - Siblings; Former smoker - ended on 08/10/2013; Marital Status - Single; Alcohol Use: Rarely; Drug Use: No History; Caffeine Use: Rarely; Financial Concerns: No; Food, Clothing or Shelter Needs: No; Support System Lacking: No; Transportation Concerns: No Electronic Signature(s) Signed: 08/07/2022 11:00:06 AM By: Fredirick Maudlin MD FACS Entered By: Fredirick Maudlin on 08/07/2022 10:44:21 -------------------------------------------------------------------------------- SuperBill Details Patient Name: Date of Service: [name] 08/07/2022 Medical Record Number: [id number] Patient Account Number: [id number] Date of Birth/Sex: Treating RN: 10-Dec-1950 (72 y.o. Male) Primary Care Provider: [name] Other Clinician: Referring Provider: Treating Provider/Extender: [name]: 20 Diagnosis Coding ICD-10 Codes Code Description [phone] Pressure ulcer of left buttock, stage 3 E66.01 Morbid (severe) obesity due to excess calories G62.89 Other specified polyneuropathies Z93.2 Ileostomy status E11.622 Type 2 diabetes mellitus with other skin ulcer Facility Procedures : CPT4 Code:  [id number] Description: [id number] - DEBRIDE WOUND 1ST 20 SQ CM OR < ICD-10 Diagnosis Description L89.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, stage 3 Modifier: Quantity: 1 Physician Procedures : CPT4 Code Description Modifier [id number] - WC PHYS LEVEL 4 - EST PT 25 ICD-10 Diagnosis Description L89.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, stage 3 G62.89 Other specified polyneuropathies E11.622 Type 2 diabetes mellitus with other skin ulcer  E66.01 Morbid (severe) obesity due to excess calories Quantity: 1 : [id number] - WC PHYS DEBR WO ANESTH 20 SQ CM ICD-10 Diagnosis Description L89.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, stage 3 Quantity: 1 Electronic Signature(s) Signed: 08/07/2022 10:46:52 AM By: Fredirick Maudlin MD FACS Entered By: Fredirick Maudlin on 08/07/2022 10:46:51

## 2022-08-08 NOTE — Progress Notes (Signed)
KIREE, BROD [facility] ([id number]) [id number] Page 1 of 6 Visit Report for 08/07/2022 Arrival Information Details Patient Name: Date of Service: [name] 08/07/2022 10:00 A M Medical Record Number: [id number] Patient Account Number: [id number] Date of Birth/Sex: Treating RN: October 18, 1950 (72 y.o. Male) Primary Care Alyria Krack: [name] Other Clinician: Referring Romelia Bromell: Treating Ambre Kobayashi/Extender: [name]: 12 Visit Information History Since Last Visit All ordered tests and consults were completed: No Patient Arrived: Stretcher Added or deleted any medications: No Arrival Time: 09:58 Any new allergies or adverse reactions: No Accompanied By: sister Had a fall or experienced change in No Transfer Assistance: Other activities of daily living that may affect Patient Identification Verified: Yes risk of falls: Secondary Verification Process Completed: Yes Signs or symptoms of abuse/neglect since last visito No Patient Requires Transmission-Based Precautions: No Hospitalized since last visit: No Implantable device outside of the clinic excluding No cellular tissue based products placed in the center since last visit: Pain Present Now: No Electronic Signature(s) Signed: 08/07/2022 11:38:44 AM By: Worthy Rancher Entered By: Worthy Rancher on 08/07/2022 09:59:13 -------------------------------------------------------------------------------- Encounter Discharge Information Details Patient Name: Date of Service: [name]. 08/07/2022 10:00 [place] Record Number: [id number] Patient Account Number: [id number] Date of Birth/Sex: Treating RN: 1950-06-01 (72 y.o. Male) Adline Peals Primary Care Lekisha Mcghee: [name] Other Clinician: Referring Chales Pelissier: Treating Orabelle Rylee/Extender: [name]: 20 Encounter Discharge Information Items Post Procedure Vitals Discharge  Condition: Stable Temperature (F): 97.9 Ambulatory Status: Stretcher Pulse (bpm): 85 Discharge Destination: Home Respiratory Rate (breaths/min): 18 Transportation: Ambulance Blood Pressure (mmHg): 137/80 Accompanied By: sister Schedule Follow-up Appointment: Yes Clinical Summary of Care: Patient Declined Electronic Signature(s) Signed: 08/07/2022 4:40:02 PM By: Adline Peals Entered By: Adline Peals on 08/07/2022 10:54:39 -------------------------------------------------------------------------------- Lower Extremity Assessment Details Patient Name: Date of Service: [name] 08/07/2022 10:00 [place] Record Number: [id number] Patient Account Number: [id number] Date of Birth/Sex: Treating RN: 03/05/1951 (72 y.o. Male) Adline Peals Primary Care Gracious Renken: [name] Other Clinician: Referring Iden Stripling: Treating Geanette Buonocore/Extender: [name]: 20 Electronic Signature(s) Signed: 08/07/2022 4:40:02 PM By: Kathrin Greathouse, Denice Bors ([id number]) [id number] Page 2 of 6 Entered By: Adline Peals on 08/07/2022 09:54:26 -------------------------------------------------------------------------------- Multi Wound Chart Details Patient Name: Date of Service: [name] 08/07/2022 10:00 A M Medical Record Number: [id number] Patient Account Number: [id number] Date of Birth/Sex: Treating RN: [birth year] (71 y.o. Male) Primary Care Keia Rask: [name] Other Clinician: Referring Carden Teel: Treating Toribio Seiber/Extender: [name]: 20 Vital Signs Height(in): 77 Capillary Blood Glucose(mg/dl): 157 Weight(lbs): 318 Pulse(bpm): 61 Body Mass Index(BMI): 37.7 Blood Pressure(mmHg): 137/80 Temperature(F): 97.9 Respiratory Rate(breaths/min): 18 [3:Photos: No Photos Left Gluteus Wound Location: Gradually Appeared Wounding Event: Pressure Ulcer  Primary Etiology: Congestive Heart Failure, Comorbid History: Hypertension, Type I Diabetes, Neuropathy, Paraplegia 05/14/2021 Date Acquired: 20 Weeks of Treatment:  Open Wound Status: No Wound Recurrence: 15.4x16.1x0.1 Measurements L x W x D (cm) 194.732 A (cm) : rea 19.473 Volume (cm) : -16.70% % Reduction in A rea: -16.70% % Reduction in Volume: Category/Stage III Classification: Medium Exudate A mount:  Serosanguineous Exudate Type: red, brown Exudate Color: Distinct, outline attached Wound Margin: Large (67-100%) Granulation A mount: Red, Friable Granulation Quality: Small (1-33%) Necrotic A mount: Fat Layer (Subcutaneous Tissue): Yes N/A Exposed  Structures: Fascia: No Tendon: No Muscle: No Joint: No Bone: No Small (1-33%) Epithelialization: Debridement - Selective/Open Wound N/A  Debridement: Pre-procedure Verification/Time Out 10:34 Taken: Lidocaine 4% Topical Solution Pain Control: Slough  Tissue Debrided: Skin/Epidermis Level: 16 Debridement A (sq cm): rea Curette Instrument: Minimum Bleeding: Pressure Hemostasis A chieved: Procedure was tolerated well Debridement Treatment Response: 15.4x16.1x0.1 Post Debridement Measurements L x W x D  (cm) 19.473 Post Debridement Volume: (cm) Category/Stage III Post Debridement Stage: Scarring: Yes Periwound Skin Texture: Excoriation: No Crepitus: No Rash: No Dry/Scaly: Yes Periwound Skin Moisture: Maceration: No Erythema: No Periwound Skin Color: No  Abnormality Temperature: Yes Tenderness on Palpation: Debridement Procedures Performed:] [N/A:N/A N/A N/A N/A N/A N/A N/A N/A N/A N/A N/A N/A N/A N/A N/A N/A N/A N/A N/A N/A N/A N/A N/A N/A N/A N/A N/A N/A N/A N/A N/A N/A N/A N/A N/A N/A N/A N/A N/A N/A  N/A] Treatment Notes GEARY, PUZIO ([id number] Page 3 of 6 Wound #3 (Gluteus) Wound Laterality: Left Cleanser Soap and Water Discharge Instruction: May shower and wash wound with dial antibacterial soap and water prior  to dressing change. Wound Cleanser Discharge Instruction: Cleanse the wound with wound cleanser prior to applying a clean dressing using gauze sponges, not tissue or cotton balls. Peri-Wound Care Zinc Oxide Ointment 30g tube Discharge Instruction: Apply Zinc Oxide to periwound with each dressing change Topical Gentamicin Discharge Instruction: As directed by physician Primary Dressing Sorbact Superabsorbent Dressing, 8x8 (in/in) Discharge Instruction: Needs 2 for the wound Secondary Dressing ABD Pad, 8x10 Discharge Instruction: Apply over primary dressing as directed. Secured With [employer] Surgical T 2x10 (in/yd) ape Discharge Instruction: Secure with tape as directed. Compression Wrap Compression Stockings Add-Ons Electronic Signature(s) Signed: 08/07/2022 10:43:35 AM By: Fredirick Maudlin MD FACS Entered By: Fredirick Maudlin on 08/07/2022 10:43:35 -------------------------------------------------------------------------------- Multi-Disciplinary Care Plan Details Patient Name: Date of Service: [name]. 08/07/2022 10:00 A M Medical Record Number: [id number] Patient Account Number: [id number] Date of Birth/Sex: Treating RN: 12-27-1950 (72 y.o. Male) Adline Peals Primary Care Tadarrius Burch: [name] Other Clinician: Referring Emaya Preston: Treating Albi Rappaport/Extender: [name]: [place] reviewed with physician Active Inactive Nutrition Nursing Diagnoses: Potential for alteratiion in Nutrition/Potential for imbalanced nutrition Goals: Patient/caregiver verbalizes understanding of need to maintain therapeutic glucose control per primary care physician Date Initiated: 03/19/2022 Target Resolution Date: 11/08/2022 Goal Status: Active Interventions: Provide education on elevated blood sugars and impact on wound healing Treatment Activities: Dietary management education, guidance and counseling :  03/19/2022 Notes: DWYNE, VADNEY ([id number]) [phone].pdf Page 4 of 6 Wound/Skin Impairment Nursing Diagnoses: Knowledge deficit related to ulceration/compromised skin integrity Goals: Ulcer/skin breakdown will have a volume reduction of 30% by week 4 Date Initiated: 03/19/2022 Target Resolution Date: 11/08/2022 Goal Status: Active Interventions: Assess ulceration(s) every visit Provide education on ulcer and skin care Treatment Activities: Skin care regimen initiated : 03/19/2022 Notes: Electronic Signature(s) Signed: 08/07/2022 4:40:02 PM By: Adline Peals Entered By: Adline Peals on 08/07/2022 09:54:35 -------------------------------------------------------------------------------- Pain Assessment Details Patient Name: Date of Service: [name]. 08/07/2022 10:00 [place] Record Number: [id number] Patient Account Number: [id number] Date of Birth/Sex: Treating RN: Dec 15, 1950 (72 y.o. Male) Primary Care Resa Rinks: [name] Other Clinician: Referring Cinnamon Morency: Treating Keta [place]/Extender: [name]: 20 Active Problems Location of Pain Severity and Description of Pain Patient Has Paino Yes Site Locations Rate the pain. Current Pain Level: 5 Worst Pain Level: 10 Least Pain Level: 0 Tolerable Pain Level: 4 Pain Management and Medication Current Pain Management: Electronic Signature(s) Signed: 08/07/2022 11:38:44 AM  By: Worthy Rancher Entered By: Worthy Rancher on 08/07/2022 10:00:27 -------------------------------------------------------------------------------- Patient/Caregiver Education Details Patient Name: Date of Service: [name] 3/28/2024andnbsp10:00 A M Medical Record Number: [id number] Patient Account Number: [id number] Date of Birth/Gender: Treating RN: 11/11/1950 (72 y.o. Male) Avetis, Caster [employer] ([id number]) [id number] Page 5 of  6 Primary Care Physician: [name] Other Clinician: Referring Physician: Treating Physician/Extender: [name]: 20 Education Assessment Education Provided To: Patient Education Topics Provided Wound/Skin Impairment: Methods: Explain/Verbal Responses: Reinforcements needed, State content correctly Electronic Signature(s) Signed: 08/07/2022 4:40:02 PM By: Adline Peals Entered By: Adline Peals on 08/07/2022 09:54:46 -------------------------------------------------------------------------------- Wound Assessment Details Patient Name: Date of Service: [name]. 08/07/2022 10:00 A M Medical Record Number: [id number] Patient Account Number: [id number] Date of Birth/Sex: Treating RN: Sep 05, 1950 (72 y.o. Male) Adline Peals Primary Care Prisha Hiley: [name] Other Clinician: Referring Smita Lesh: Treating Butch Otterson/Extender: [name]: 20 Wound Status Wound Number: 3 Primary Pressure Ulcer Etiology: Wound Location: Left Gluteus Wound Open Wounding Event: Gradually Appeared Status: Date Acquired: 05/14/2021 Comorbid Congestive Heart Failure, Hypertension, Type I Diabetes, Weeks Of Treatment: 20 History: Neuropathy, Paraplegia Clustered Wound: No Wound Measurements Length: (cm) 15.4 Width: (cm) 16.1 Depth: (cm) 0.1 Area: (cm) 194.732 Volume: (cm) 19.473 % Reduction in Area: -16.7% % Reduction in Volume: -16.7% Epithelialization: Small (1-33%) Tunneling: No Undermining: No Wound Description Classification: Category/Stage III Wound Margin: Distinct, outline attached Exudate Amount: Medium Exudate Type: Serosanguineous Exudate Color: red, brown Foul Odor After Cleansing: No Slough/Fibrino Yes Wound Bed Granulation Amount: Large (67-100%) Exposed Structure Granulation Quality: Red, Friable Fascia Exposed: No Necrotic Amount: Small (1-33%) Fat Layer (Subcutaneous  Tissue) Exposed: Yes Necrotic Quality: Adherent Slough Tendon Exposed: No Muscle Exposed: No Joint Exposed: No Bone Exposed: No Periwound Skin Texture Texture Color No Abnormalities Noted: No No Abnormalities Noted: No Crepitus: No Erythema: No Excoriation: No Temperature / Pain Rash: No Temperature: No Abnormality Scarring: Yes Tenderness on Palpation: Yes Moisture No Abnormalities Noted: No LERRY, COUNSELMAN ([id number]) [phone].pdf Page 6 of 6 Dry / Scaly: Yes Maceration: No Treatment Notes Wound #3 (Gluteus) Wound Laterality: Left Cleanser Soap and Water Discharge Instruction: May shower and wash wound with dial antibacterial soap and water prior to dressing change. Wound Cleanser Discharge Instruction: Cleanse the wound with wound cleanser prior to applying a clean dressing using gauze sponges, not tissue or cotton balls. Peri-Wound Care Zinc Oxide Ointment 30g tube Discharge Instruction: Apply Zinc Oxide to periwound with each dressing change Topical Gentamicin Discharge Instruction: As directed by physician Primary Dressing Sorbact Superabsorbent Dressing, 8x8 (in/in) Discharge Instruction: Needs 2 for the wound Secondary Dressing ABD Pad, 8x10 Discharge Instruction: Apply over primary dressing as directed. Secured With [employer] Surgical T 2x10 (in/yd) ape Discharge Instruction: Secure with tape as directed. Compression Wrap Compression Stockings Add-Ons Electronic Signature(s) Signed: 08/07/2022 4:40:02 PM By: Adline Peals Entered By: Adline Peals on 08/07/2022 10:11:53 -------------------------------------------------------------------------------- Vitals Details Patient Name: Date of Service: [name]. 08/07/2022 10:00 A M Medical Record Number: [id number] Patient Account Number: [id number] Date of Birth/Sex: Treating RN: 03/01/1951 (72 y.o. Male) Primary Care Rosabelle Jupin: [name] Other  Clinician: Referring Tobiah Celestine: Treating Sable Knoles/Extender: [name]: 20 Vital Signs Time Taken: 09:59 Temperature (F): 97.9 Height (in): 77 Pulse (bpm): 85 Weight (lbs): 318 Respiratory Rate (breaths/min): 18 Body Mass Index (BMI): 37.7 Blood Pressure (mmHg): 137/80 Capillary Blood Glucose (mg/dl): 157 Reference Range: 80 -  120 mg / dl Electronic Signature(s) Signed: 08/07/2022 11:38:44 AM By: Worthy Rancher Entered By: Worthy Rancher on 08/07/2022 10:00:39

## 2022-09-05 ENCOUNTER — Encounter (HOSPITAL_BASED_OUTPATIENT_CLINIC_OR_DEPARTMENT_OTHER): Payer: Medicare Other | Attending: General Surgery | Admitting: General Surgery

## 2022-09-05 DIAGNOSIS — Z932 Ileostomy status: Secondary | ICD-10-CM | POA: Insufficient documentation

## 2022-09-05 DIAGNOSIS — G6289 Other specified polyneuropathies: Secondary | ICD-10-CM | POA: Diagnosis not present

## 2022-09-05 DIAGNOSIS — L89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, stage 3: Secondary | ICD-10-CM | POA: Insufficient documentation

## 2022-09-05 DIAGNOSIS — Z6837 Body mass index (BMI) 37.0-37.9, adult: Secondary | ICD-10-CM | POA: Diagnosis not present

## 2022-09-06 NOTE — Progress Notes (Signed)
ESTEPHAN, GALLARDO ([id number]) [id number] Page 1 of 10 Visit Report for 09/05/2022 Chief Complaint Document Details Patient Name: Date of Service: [name] 09/05/2022 10:15 A M Medical Record Number: [id number] Patient Account Number: [id number] Date of Birth/Sex: Treating RN: 10-31-1950 (72 y.o. M) Primary Care Provider: Caffie Damme Other Clinician: Referring Provider: Treating Provider/Extender: Haze Rushing in Treatment: 24 Information Obtained from: Patient Chief Complaint Here for follow up abdominal wound present following laparotomy, colostomy. 03/19/2022: Here for pressure ulcer on left gluteus and ulcer adjacent to ileostomy Electronic Signature(s) Signed: 09/05/2022 10:58:44 AM By: Duanne Guess MD FACS Entered By: Duanne Guess on 09/05/2022 10:58:44 -------------------------------------------------------------------------------- Debridement Details Patient Name: Date of Service: [name]. 09/05/2022 10:15 A M Medical Record Number: [id number] Patient Account Number: [id number] Date of Birth/Sex: Treating RN: [birth year] (72 y.o. [name] Primary Care Provider: Caffie Damme Other Clinician: Referring Provider: Treating Provider/Extender: Murvin Natal Weeks in Treatment: 24 Debridement Performed for Assessment: Wound #3 Left Gluteus Performed By: Physician Duanne Guess, MD Debridement Type: Debridement Level of Consciousness (Pre-procedure): Awake and Alert Pre-procedure Verification/Time Out Yes - 10:45 Taken: Start Time: 10:45 Pain Control: Lidocaine 4% T opical Solution Percent of Wound Bed Debrided: 100% T Area Debrided (cm): otal 55.95 Tissue and other material debrided: Non-Viable, Slough, Slough Level: Non-Viable Tissue Debridement Description: Selective/Open Wound Instrument: Curette Bleeding: Minimum Hemostasis Achieved: Pressure End Time:  10:46 Procedural Pain: 0 Post Procedural Pain: 0 Response to Treatment: Procedure was tolerated well Level of Consciousness (Post- Awake and Alert procedure): Post Debridement Measurements of Total Wound Length: (cm) 8.8 Stage: Category/Stage III Width: (cm) 8.1 Depth: (cm) 0.1 Volume: (cm) 5.598 Character of Wound/Ulcer Post Debridement: Improved Post Procedure Diagnosis Same as Pre-procedure Notes Scribed for Dr. Lady Gary by J.Scotton Electronic Signature(s) SOLACE, WENDORFF ([id number]) [id number].pdf Page 2 of 10 Signed: 09/05/2022 12:17:23 PM By: Duanne Guess MD FACS Signed: 09/05/2022 4:37:07 PM By: Karie Schwalbe RN Entered By: Karie Schwalbe on 09/05/2022 10:49:09 -------------------------------------------------------------------------------- Debridement Details Patient Name: Date of Service: [name]. 09/05/2022 10:15 A M Medical Record Number: [id number] Patient Account Number: [id number] Date of Birth/Sex: Treating RN: 11-22-1950 (72 y.o. [name] Primary Care Provider: Caffie Damme Other Clinician: Referring Provider: Treating Provider/Extender: Haze Rushing in Treatment: 24 Debridement Performed for Assessment: Wound #5 Scrotum Performed By: Physician Duanne Guess, MD Debridement Type: Debridement Level of Consciousness (Pre-procedure): Awake and Alert Pre-procedure Verification/Time Out Yes - 10:45 Taken: Start Time: 10:45 Pain Control: Lidocaine 4% Topical Solution Percent of Wound Bed Debrided: 100% T Area Debrided (cm): otal 1.32 Tissue and other material debrided: Non-Viable, Eschar, Slough, Slough Level: Non-Viable Tissue Debridement Description: Selective/Open Wound Instrument: Curette Bleeding: Minimum Hemostasis Achieved: Pressure End Time: 10:46 Procedural Pain: 0 Post Procedural Pain: 0 Response to Treatment: Procedure was tolerated well Level of Consciousness (Post-  Awake and Alert procedure): Post Debridement Measurements of Total Wound Length: (cm) 1.4 Width: (cm) 1.2 Depth: (cm) 0.1 Volume: (cm) 0.132 Character of Wound/Ulcer Post Debridement: Improved Post Procedure Diagnosis Same as Pre-procedure Notes Scribed for Dr. Lady Gary by J.Scotton Electronic Signature(s) Signed: 09/05/2022 12:17:23 PM By: Duanne Guess MD FACS Signed: 09/05/2022 4:37:07 PM By: Karie Schwalbe RN Entered By: Karie Schwalbe on 09/05/2022 10:50:12 -------------------------------------------------------------------------------- HPI Details Patient Name: Date of Service: [name]. 09/05/2022 10:15 A M Medical Record Number: [id number] Patient Account Number: [id number] Date of Birth/Sex: Treating RN: 1951/03/21 (72 y.o. M) Primary Care Provider:  Caffie Damme Other Clinician: Referring Provider: Treating Provider/Extender: Murvin Natal Weeks in Treatment: 24 History of Present Illness HPI Description: On medihoney with border dressing due to stalling on collagen and concern ABD was pulling off skin. 09/11/14 Nearly healed, counseled given the thin attenuated scar skin I expect will have some recurrence wounds with slight trauma or maceration, however much improved today. cont ABD pads and medihoney. 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 He resides with his sister and is on a regular hospital bed. The 2 of them reported that for about the last year, he has had ulceration adjacent to his ileostomy as well as on his left  gluteus and upper posterior thigh. Most recent hemoglobin A1c was 6.3%. On his left gluteus, there is a stage II pressure ulcer with surrounding tissue maceration and excoriation. There is a little slough on the wound surface. Adjacent to his ileostomy, associated with his appliance, there is an ulcer that extends into the fat layer. It is clean without any slough or necrotic tissue. 04/10/2022: The left gluteal ulcer has expanded and is quite a bit larger today. There is slough and eschar accumulation. The patient's sister reports that the ileostomy associated wound got better and so they did not go to see the outpatient ostomy nurse. They have not received the low-air-loss mattress. 05/01/2022: Apparently the low-air-loss mattress has been denied by his insurance, but they are willing to cover a gel mattress overlay. The ileostomy-area wound has contracted further, per the patient's sister; we are not managing this wound at this time. The left gluteal ulcer seems to be about the same size, but there is extensive slough on all of the open surfaces. His skin is extremely friable. 05/22/2022: The gel mattress overlay was finally delivered last week. The left gluteal ulcer has deteriorated somewhat. There is slough on all of the open surfaces and the total area of the wound is larger. It has a funky odor to it today. 06/13/2022: The gluteal wound is smaller and has substantially less slough. Epithelium is filling in around the margins. The odor that was present at his last visit has dissipated. 07/03/2022: The gluteal wound is a little bit smaller again today. It seems more superficial and just has a light layer of slough on the surface. 08/07/2022: No significant change to the gluteal wound. The patient's sister does report that it has bled less and seems less friable. 09/05/2022: The gluteal wound looks a little bit better, with smaller dimensions and less friable tissue. He has had some breakdown on his  perineum, adjacent to his scrotum. It appears similar in nature to the gluteal wound. Electronic Signature(s) Signed: 09/05/2022 11:14:06 AM By: Duanne Guess MD FACS Previous Signature: 09/05/2022 11:04:38 AM Version By: Duanne Guess MD FACS Entered By: Duanne Guess on 09/05/2022 11:14:06 -------------------------------------------------------------------------------- Physical Exam Details Patient Name: Date of Service: [name] 09/05/2022 10:15 A M Medical Record Number: [id number] Patient Account Number: [id number] Date of Birth/Sex: Treating RN: 07/02/1950 (72 y.o. M) Primary Care Provider: Caffie Damme Other Clinician: Referring Provider: Treating Provider/Extender: Murvin Natal Weeks in Treatment: 24 Constitutional . Bradycardic, asymptomatic. . . no acute distress.  Respiratory Normal work of breathing on room air. Notes 09/05/2022: The gluteal wound looks a little bit better, with smaller dimensions and less friable tissue. He has had some breakdown on his perineum, adjacent to his scrotum. It appears similar in nature to the gluteal wound. Electronic Signature(s) Signed: 09/05/2022 11:14:54 AM By: Duanne Guess MD FACS Entered By: Duanne Guess on 09/05/2022 11:14:53 -------------------------------------------------------------------------------- Physician Orders Details Patient Name: Date of Service: [name] 09/05/2022 10:15 A M Medical Record Number: [id number] Patient Account Number: [id number] Date of Birth/Sex: Treating RN: Jun 28, 1950 (72 y.o. 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 - *****STRETCHER*******Dr. Lady Gary Rm 3 Anesthetic Wound #3 Left Gluteus (In clinic) Topical Lidocaine 4% applied to wound bed Bathing/ Shower/ Hygiene May shower and wash wound with soap and water. Off-Loading Wound #3 Left Gluteus Gel mattress overlay (Group 1) Turn and reposition every 2 hours Wound Treatment Wound #3 - Gluteus Wound Laterality: Left Cleanser: Soap and Water 1 x Per Day/30 Days Discharge Instructions: May shower and wash wound with dial antibacterial soap and water prior to dressing change. Cleanser: Wound Cleanser (Generic) 1 x Per Day/30 Days Discharge Instructions: Cleanse the wound with wound cleanser prior to applying a clean dressing using gauze sponges, not tissue or cotton balls. Peri-Wound Care: Zinc Oxide Ointment 30g tube (Generic) 1 x Per Day/30 Days Discharge Instructions: Apply Zinc Oxide to periwound with each dressing change Topical: Gentamicin 1 x Per Day/30 Days Discharge Instructions: As directed by physician Prim Dressing: Sorbact Superabsorbent Dressing, 8x8 (in/in) (Generic) 1 x Per Day/30 Days ary Discharge Instructions: Needs 2 for the wound Secondary Dressing: ABD Pad, 8x10 (Generic) 1 x Per Day/30 Days Discharge Instructions: Apply over primary dressing as directed. Secured With: 57M Med[employer] Scientist, research (life sciences) Surgical T 2x10 (in/yd) (Generic) 1 x Per Day/30 Days ape Discharge Instructions: Secure with tape as directed. Wound #5 - Scrotum Cleanser: Soap and Water 1 x Per Day/30 Days Discharge Instructions: May shower and wash wound with dial antibacterial soap and water prior to dressing change. Cleanser: Wound Cleanser (Generic) 1 x Per Day/30 Days Discharge Instructions: Cleanse the wound with wound cleanser prior to  applying a clean dressing using gauze sponges, not tissue or cotton balls. Peri-Wound Care: Zinc Oxide Ointment 30g tube (Generic) 1 x Per Day/30 Days Discharge Instructions: Apply Zinc Oxide to periwound with each dressing change Topical: Gentamicin 1 x Per Day/30 Days Discharge Instructions: As directed by physician Prim Dressing: Sorbact Superabsorbent Dressing, 8x8 (in/in) (Generic) 1 x Per Day/30 Days ary Discharge Instructions: Needs 2 for the wound Secondary Dressing: ABD Pad, 8x10 (Generic) 1 x Per Day/30 Days Discharge Instructions: Apply over primary dressing as directed. Secured With: 57M Medipore Scientist, research (life sciences) Surgical T 2x10 (in/yd) (Generic) 1 x Per Day/30 Days ape Discharge Instructions: Secure with tape as directed. Electronic Signature(s) Signed: 09/05/2022 12:17:23 PM By: Duanne Guess MD FACS Entered By: Duanne Guess on 09/05/2022 11:15:08 [name] ([id number]) [id number] Page 5 of 10 -------------------------------------------------------------------------------- Problem List Details Patient Name: Date of Service: [name]. 09/05/2022 10:15  A M Medical Record Number: [id number] Patient Account Number: [id number] Date of Birth/Sex: Treating RN: 12-30-1950 (72 y.o. M) Primary Care Provider: Caffie Damme Other Clinician: Referring Provider: Treating Provider/Extender: Murvin Natal Weeks in Treatment: 24 Active Problems ICD-10 Encounter Code Description Active Date MDM Diagnosis [phone] Pressure ulcer of left buttock, stage 3 03/19/2022 No Yes E66.01 Morbid (severe) obesity due to excess calories 03/19/2022 No Yes G62.89 Other specified polyneuropathies 03/19/2022 No Yes Z93.2 Ileostomy status 03/19/2022 No Yes E11.622 Type 2 diabetes mellitus with other skin ulcer 03/19/2022 No Yes Inactive Problems ICD-10 Code Description Active Date Inactive Date L98.492 Non-pressure chronic ulcer of skin of other  sites with fat layer exposed 03/19/2022 03/19/2022 Resolved Problems Electronic Signature(s) Signed: 09/05/2022 10:58:29 AM By: Duanne Guess MD FACS Entered By: Duanne Guess on 09/05/2022 10:58:28 -------------------------------------------------------------------------------- Progress Note Details Patient Name: Date of Service: [name]. 09/05/2022 10:15 A M Medical Record Number: [id number] Patient Account Number: [id number] Date of Birth/Sex: Treating RN: 01-12-1951 (72 y.o. M) Primary Care Provider: Caffie Damme Other Clinician: Referring Provider: Treating Provider/Extender: Haze Rushing in Treatment: 24 Subjective Chief Complaint Information obtained from Patient Here for follow up abdominal wound present following laparotomy, colostomy. 03/19/2022: Here for pressure ulcer on left gluteus and ulcer adjacent to ileostomy History of Present Illness (HPI) On medihoney with border dressing due to stalling on collagen and concern ABD was pulling off skin. 09/11/14 Nearly healed, counseled given the thin attenuated scar skin I expect will have some recurrence wounds with slight trauma or maceration, however SHAWNEE, HIGHAM ([id number]) [id number] Page 6 of 10 much improved today. cont ABD pads and medihoney. 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 He resides with his sister and is on a regular hospital bed. The 2 of them reported that for about the last year, he has had ulceration adjacent to his ileostomy as well as on his left gluteus and upper posterior thigh. Most recent hemoglobin  A1c was 6.3%. On his left gluteus, there is a stage II pressure ulcer with surrounding tissue maceration and excoriation. There is a little slough on the wound surface. Adjacent to his ileostomy, associated with his appliance, there is an ulcer that extends into the fat layer. It is clean without any slough or necrotic tissue. 04/10/2022: The left gluteal ulcer has expanded and is quite a bit larger today. There is slough and eschar accumulation. The patient's sister reports that the ileostomy associated wound got better and so they did not go to see the outpatient ostomy nurse. They have not received the low-air-loss mattress. 05/01/2022: Apparently the low-air-loss mattress has been denied by his insurance, but they are willing to cover a gel mattress overlay. The ileostomy-area wound has contracted further, per the patient's sister; we are not managing this wound at this time. The left gluteal ulcer seems to be about the same size, but there is extensive slough on all of the open surfaces. His skin is extremely friable. 05/22/2022: The gel mattress overlay was finally delivered last week. The left gluteal ulcer has deteriorated somewhat. There is slough on all of the open surfaces and the total area of the wound is larger. It has a funky odor to it today.  06/13/2022: The gluteal wound is smaller and has substantially less slough. Epithelium is filling in around the margins. The odor that was present at his last visit has dissipated. 07/03/2022: The gluteal wound is a little bit smaller again today. It seems more superficial and just has a light layer of slough on the surface. 08/07/2022: No significant change to the gluteal wound. The patient's sister does report that it has bled less and seems less friable. 09/05/2022: The gluteal wound looks a little bit better, with smaller dimensions and less friable tissue. He has had some breakdown on his perineum, adjacent to his scrotum. It appears similar in  nature to the gluteal wound. Patient History Information obtained from Patient. Family History Cancer - Father,Mother, Diabetes - Father,Mother,Siblings, Heart Disease - Mother,Siblings, Hypertension - Father,Siblings, Kidney Disease - Siblings. Social History Former smoker - ended on 08/10/2013, Marital Status - Single, Alcohol Use - Rarely, Drug Use - No History, Caffeine Use - Rarely. Medical History Constitutional Symptoms (General Health) Patient has history of Congestive Heart Failure Eyes Denies history of Cataracts, Glaucoma, Optic Neuritis Ear/Nose/Mouth/Throat Denies history of Chronic sinus problems/congestion, Middle ear problems Cardiovascular Patient has history of Hypertension Endocrine Patient has history of Type I Diabetes Denies history of Type 1 Diabetes Genitourinary Denies history of End Stage Renal Disease Neurologic Patient has history of Neuropathy, Paraplegia Psychiatric Denies history of Anorexia/bulimia, Confinement Anxiety Medical A Surgical History Notes nd Cardiovascular atrial fibrillation Musculoskeletal arthritis Objective Constitutional Bradycardic, asymptomatic. no acute distress. Vitals Time Taken: 10:21 AM, Height: 77 in, Source: Stated, Weight: 318 lbs, Source: Stated, BMI: 37.7, Temperature: 97.7 F, Pulse: 51 bpm, Respiratory Rate: 18 breaths/min, Blood Pressure: 138/71 mmHg, Capillary Blood Glucose: 151 mg/dl. Respiratory Normal work of breathing on room air. General Notes: 09/05/2022: The gluteal wound looks a little bit better, with smaller dimensions and less friable tissue. He has had some breakdown on his ALFORD, GAMERO ([id number]) [id number] Page 7 of 10 perineum, adjacent to his scrotum. It appears similar in nature to the gluteal wound. Integumentary (Hair, Skin) Wound #3 status is Open. Original cause of wound was Gradually Appeared. The date acquired was: 05/14/2021. The wound has been in treatment  24 weeks. The wound is located on the Left Gluteus. The wound measures 8.8cm length x 8.1cm width x 0.1cm depth; 55.983cm^2 area and 5.598cm^3 volume. There is Fat Layer (Subcutaneous Tissue) exposed. There is no tunneling or undermining noted. There is a medium amount of serosanguineous drainage noted. The wound margin is distinct with the outline attached to the wound base. There is medium (34-66%) red, friable, hyper - granulation within the wound bed. There is a medium (34-66%) amount of necrotic tissue within the wound bed including Adherent Slough. The periwound skin appearance exhibited: Scarring, Dry/Scaly. The periwound skin appearance did not exhibit: Callus, Crepitus, Excoriation, Induration, Rash, Maceration, Atrophie Blanche, Cyanosis, Ecchymosis, Hemosiderin Staining, Mottled, Pallor, Rubor, Erythema. Periwound temperature was noted as No Abnormality. The periwound has tenderness on palpation. Wound #5 status is Open. Original cause of wound was Gradually Appeared. The date acquired was: 09/05/2022. The wound is located on the Scrotum. The wound measures 1.4cm length x 1.2cm width x 0.1cm depth; 1.319cm^2 area and 0.132cm^3 volume. There is Fat Layer (Subcutaneous Tissue) exposed. There is no tunneling or undermining noted. There is a medium amount of serosanguineous drainage noted. The wound margin is distinct with the outline attached to the wound base. There is medium (34-66%) red, pink, hyper - granulation within the wound bed. There is  a medium (34-66%) amount of necrotic tissue within the wound bed including Adherent Slough. The periwound skin appearance had no abnormalities noted for texture. The periwound skin appearance had no abnormalities noted for color. The periwound skin appearance did not exhibit: Dry/Scaly, Maceration. Periwound temperature was noted as No Abnormality. The periwound has tenderness on palpation. 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 There was a Selective/Open Wound Non-Viable Tissue Debridement with a total area of 55.95 sq cm performed by Duanne Guess, MD. With the following instrument(s): Curette to remove Non-Viable tissue/material. Material removed includes [facility] after achieving pain control using Lidocaine 4% Topical Solution. No specimens were taken. A time out was conducted at 10:45, prior to the start of the procedure. A Minimum amount of bleeding was controlled with Pressure. The procedure was tolerated well with a pain level of 0 throughout and a pain level of 0 following the procedure. Post Debridement Measurements: 8.8cm length x 8.1cm width x 0.1cm depth; 5.598cm^3 volume. Post debridement Stage noted as Category/Stage III. Character of Wound/Ulcer Post Debridement is improved. Post procedure Diagnosis Wound #3: Same as Pre-Procedure General Notes: Scribed for Dr. Lady Gary by J.Scotton. Wound #5 Pre-procedure diagnosis of Wound #5 is a Lesion located on the Scrotum . There was a Selective/Open Wound Non-Viable Tissue Debridement with a total area of 1.32 sq cm performed by Duanne Guess, MD. With the following instrument(s): Curette to remove Non-Viable tissue/material. Material removed includes Eschar and Slough and after achieving pain control using Lidocaine 4% Topical Solution. No specimens were taken. A time out was conducted at 10:45, prior to the start of the procedure. A Minimum amount of bleeding was controlled with Pressure. The procedure was tolerated well with a pain level of 0 throughout and a pain level of 0 following the procedure. Post Debridement Measurements: 1.4cm length x 1.2cm width x 0.1cm depth; 0.132cm^3  volume. Character of Wound/Ulcer Post Debridement is improved. Post procedure Diagnosis Wound #5: Same as Pre-Procedure General Notes: Scribed for Dr. Lady Gary by J.Scotton. Plan Follow-up Appointments: Return appointment in 1 month. - *****STRETCHER*******Dr. Lady Gary Rm 3 Anesthetic: Wound #3 Left Gluteus: (In clinic) Topical Lidocaine 4% applied to wound bed Bathing/ Shower/ Hygiene: May shower and wash wound with soap and water. Off-Loading: Wound #3 Left Gluteus: Gel mattress overlay (Group 1) Turn and reposition every 2 hours WOUND #3: - Gluteus Wound Laterality: Left Cleanser: Soap and Water 1 x Per Day/30 Days Discharge Instructions: May shower and wash wound with dial antibacterial soap and water prior to dressing change. Cleanser: Wound Cleanser (Generic) 1 x Per Day/30 Days Discharge Instructions: Cleanse the wound with wound cleanser prior to applying a clean dressing using gauze sponges, not tissue or cotton balls. Peri-Wound Care: Zinc Oxide Ointment 30g tube (Generic) 1 x Per Day/30 Days Discharge Instructions: Apply Zinc Oxide to periwound with each dressing change Topical: Gentamicin 1 x Per Day/30 Days Discharge Instructions: As directed by physician Prim Dressing: Sorbact Superabsorbent Dressing, 8x8 (in/in) (Generic) 1 x Per Day/30 Days SRIHITH, AQUILINO ([id number]) [id number] Page 8 of 10 Discharge Instructions: Needs 2 for the wound Secondary Dressing: ABD Pad, 8x10 (Generic) 1 x Per Day/30 Days Discharge Instructions: Apply over primary dressing as directed. Secured With: 78M [employer] Scientist, research (life sciences) Surgical T 2x10 (  in/yd) (Generic) 1 x Per Day/30 Days ape Discharge Instructions: Secure with tape as directed. WOUND #5: - Scrotum Wound Laterality: Cleanser: Soap and Water 1 x Per Day/30 Days Discharge Instructions: May shower and wash wound with dial antibacterial soap and water prior to dressing change. Cleanser: Wound Cleanser  (Generic) 1 x Per Day/30 Days Discharge Instructions: Cleanse the wound with wound cleanser prior to applying a clean dressing using gauze sponges, not tissue or cotton balls. Peri-Wound Care: Zinc Oxide Ointment 30g tube (Generic) 1 x Per Day/30 Days Discharge Instructions: Apply Zinc Oxide to periwound with each dressing change Topical: Gentamicin 1 x Per Day/30 Days Discharge Instructions: As directed by physician Prim Dressing: Sorbact Superabsorbent Dressing, 8x8 (in/in) (Generic) 1 x Per Day/30 Days ary Discharge Instructions: Needs 2 for the wound Secondary Dressing: ABD Pad, 8x10 (Generic) 1 x Per Day/30 Days Discharge Instructions: Apply over primary dressing as directed. Secured With: 57M [employer] Scientist, research (life sciences) Surgical T 2x10 (in/yd) (Generic) 1 x Per Day/30 Days ape Discharge Instructions: Secure with tape as directed. 09/05/2022: The gluteal wound looks a little bit better, with smaller dimensions and less friable tissue. He has had some breakdown on his perineum, adjacent to his scrotum. It appears similar in nature to the gluteal wound. I used a curette to debride slough on the gluteal wound and slough and eschar from the new wound on his perineum. We will treat both of these the same, with topical gentamicin and Sorbact superabsorbent dressing. Follow-up in 1 month. Electronic Signature(s) Signed: 09/05/2022 11:15:54 AM By: Duanne Guess MD FACS Entered By: Duanne Guess on 09/05/2022 11:15:54 -------------------------------------------------------------------------------- HxROS Details Patient Name: Date of Service: [name]. 09/05/2022 10:15 A M Medical Record Number: [id number] Patient Account Number: [id number] Date of Birth/Sex: Treating RN: [birth year] (72 y.o. M) Primary Care Provider: Caffie Damme Other Clinician: Referring Provider: Treating Provider/Extender: Haze Rushing in Treatment: 24 Information Obtained  From Patient Constitutional Symptoms (General Health) Medical History: Positive for: Congestive Heart Failure Eyes Medical History: Negative for: Cataracts; Glaucoma; Optic Neuritis Ear/Nose/Mouth/Throat Medical History: Negative for: Chronic sinus problems/congestion; Middle ear problems Cardiovascular Medical History: Positive for: Hypertension Past Medical History Notes: atrial fibrillation Endocrine Medical History: Positive for: Type I Diabetes Negative for: Type 1 Diabetes Time with diabetes: less then 10 Treated with: Oral agents JAMARKIS, BRANAM ([id number]) [id number] Page 9 of 10 Genitourinary Medical History: Negative for: End Stage Renal Disease Musculoskeletal Medical History: Past Medical History Notes: arthritis Neurologic Medical History: Positive for: Neuropathy; Paraplegia Psychiatric Medical History: Negative for: Anorexia/bulimia; Confinement Anxiety Immunizations Pneumococcal Vaccine: Received Pneumococcal Vaccination: Yes Received Pneumococcal Vaccination On or After 60th Birthday: Yes Implantable Devices None Family and Social History Cancer: Yes - Father,Mother; Diabetes: Yes - Father,Mother,Siblings; Heart Disease: Yes - Mother,Siblings; Hypertension: Yes - Father,Siblings; Kidney Disease: Yes - Siblings; Former smoker - ended on 08/10/2013; Marital Status - Single; Alcohol Use: Rarely; Drug Use: No History; Caffeine Use: Rarely; Financial Concerns: No; Food, Clothing or Shelter Needs: No; Support System Lacking: No; Transportation Concerns: No Electronic Signature(s) Signed: 09/05/2022 12:17:23 PM By: Duanne Guess MD FACS Entered By: Duanne Guess on 09/05/2022 11:14:12 -------------------------------------------------------------------------------- SuperBill Details Patient Name: Date of Service: [name] 09/05/2022 Medical Record Number: [id number] Patient Account Number: [id number] Date of  Birth/Sex: Treating RN: 03-15-1951 (72 y.o. 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 Facility Procedures : CPT4 Code: [id number] Description: 97597 - DEBRIDE WOUND 1ST 20 SQ CM OR < ICD-10 Diagnosis Description L89.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, stage 3 Modifier: Quantity: 1 Physician Procedures : CPT4 Code Description Modifier [id number] 99214 - WC PHYS LEVEL 4 - EST PT 25 ICD-10 Diagnosis Description L89.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, stage 3 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

## 2022-09-09 NOTE — Progress Notes (Signed)
Bruce, [name] ([id number]) [id number] Page 1 of 8 Visit Report for 09/05/2022 Arrival Information Details Patient Name: Date of Service: [name] 09/05/2022 10:15 A M Medical Record Number: [id number] Patient Account Number: [id number] Date of Birth/Sex: Treating RN: [birth year] (72 y.o. [name] Primary Care [name]: [name] Other Clinician: Referring [name]: Treating [name]/Extender: [name] in [name]: 24 Visit Information History Since Last Visit All ordered tests and consults were completed: Yes Patient Arrived: Stretcher Added or deleted any medications: No Arrival Time: 10:20 Any new allergies or adverse reactions: No Accompanied By: sister Had a fall or experienced change in No Transfer Assistance: Stretcher activities of daily living that may affect Patient Identification Verified: Yes risk of falls: Secondary Verification Process Completed: Yes Signs or symptoms of abuse/neglect since last visito No Patient Requires Transmission-Based Precautions: No Hospitalized since last visit: No Patient Has Alerts: No Implantable device outside of the clinic excluding No cellular tissue based products placed in the center since last visit: Has Dressing in Place as Prescribed: Yes Pain Present Now: Yes Electronic Signature(s) Signed: 09/08/2022 4:01:24 PM By: [name] Entered By: [name] on 09/05/2022 10:21:23 -------------------------------------------------------------------------------- Encounter Discharge Information Details Patient Name: Date of Service: [name]. 09/05/2022 10:15 A M Medical Record Number: [id number] Patient Account Number: [id number] Date of Birth/Sex: Treating RN: [birth year] (72 y.o. [name] Primary Care [name]: [name] Other Clinician: Referring [name]: Treating [name]/Extender: [name] in  [name]: 24 Encounter Discharge Information Items Post Procedure Vitals Discharge Condition: Stable Temperature (F): 97.7 Ambulatory Status: Stretcher Pulse (bpm): 51 Discharge Destination: Home Respiratory Rate (breaths/min): 18 Transportation: Private Auto Blood Pressure (mmHg): 138/71 Accompanied By: sister Schedule Follow-up Appointment: Yes Clinical Summary of Care: Patient Declined Electronic Signature(s) Signed: 09/05/2022 4:37:07 PM By: Karie Schwalbe RN Entered By: Karie Schwalbe on 09/05/2022 16:04:34 -------------------------------------------------------------------------------- Lower Extremity Assessment Details Patient Name: Date of Service: [name] 09/05/2022 10:15 A M Medical Record Number: [id number] Patient Account Number: [id number] Date of Birth/Sex: Treating RN: August 14, 1950 (73 y.o. [name] Primary Care [name]: [name] Other Clinician: Referring Addilyn Satterwhite: Treating [name]/Extender: [name]: 24 Electronic Signature(s) Signed: 09/08/2022 4:01:24 PM By: [name] ([id number]) PM By: [name] [id number].pdf Page 2 of 8 Signed: 09/08/2022 4:01:24 Entered By: [name] on 09/05/2022 10:27:42 -------------------------------------------------------------------------------- Multi Wound Chart Details Patient Name: Date of Service: [name] 09/05/2022 10:15 A M Medical Record Number: [id number] Patient Account Number: [id number] Date of Birth/Sex: Treating RN: Mar 30, 1951 (72 y.o. M) Primary Care [name]: [name] Other Clinician: Referring [name]: Treating Zamiah Tollett/Extender: [name]: 24 Vital Signs Height(in): 77 Capillary Blood Glucose(mg/dl): 220 Weight(lbs): 254 Pulse(bpm): 51 Body Mass Index(BMI): 37.7 Blood Pressure(mmHg): 138/71 Temperature(F): 97.7 Respiratory  Rate(breaths/min): 18 [3:Photos:] [N/A:N/A] Left Gluteus Scrotum N/A Wound Location: Gradually Appeared Gradually Appeared N/A Wounding Event: Pressure Ulcer Lesion N/A Primary Etiology: Congestive Heart Failure, Congestive Heart Failure, N/A Comorbid History: Hypertension, Type I Diabetes, Hypertension, Type I Diabetes, Neuropathy, Paraplegia Neuropathy, Paraplegia 05/14/2021 09/05/2022 N/A Date Acquired: 24 0 N/A Weeks of [name]: Open Open N/A Wound Status: No No N/A Wound Recurrence: 8.8x8.1x0.1 1.4x1.2x0.1 N/A Measurements L x W x D (cm) 55.983 1.319 N/A A (cm) : rea 5.598 0.132 N/A Volume (cm) : 66.50% N/A N/A % Reduction in A rea: 66.50% N/A N/A % Reduction in Volume: Category/Stage III Full Thickness Without Exposed  N/A Classification: Support Structures Medium Medium N/A Exudate A mount: Serosanguineous Serosanguineous N/A Exudate Type: red, brown red, brown N/A Exudate Color: Distinct, outline attached Distinct, outline attached N/A Wound Margin: Medium (34-66%) Medium (34-66%) N/A Granulation A mount: Red, Hyper-granulation, Friable Red, Pink, Hyper-granulation N/A Granulation Quality: Medium (34-66%) Medium (34-66%) N/A Necrotic A mount: Fat Layer (Subcutaneous Tissue): Yes Fat Layer (Subcutaneous Tissue): Yes N/A Exposed Structures: Fascia: No Fascia: No Tendon: No Tendon: No Muscle: No Muscle: No Joint: No Joint: No Bone: No Bone: No Small (1-33%) None N/A Epithelialization: Debridement - Selective/Open Wound Debridement - Selective/Open Wound N/A Debridement: Pre-procedure Verification/Time Out 10:45 10:45 N/A Taken: Lidocaine 4% Topical Solution Lidocaine 4% Topical Solution N/A Pain Control: Ambulance person, Slough N/A Tissue Debrided: Non-Viable Tissue Non-Viable Tissue N/A Level: 55.95 1.32 N/A Debridement A (sq cm): rea Curette Curette N/A Instrument: Minimum Minimum N/A Bleeding: Pressure Pressure  N/A Hemostasis A chieved: 0 0 N/A Procedural Pain: 0 0 N/A Post Procedural Pain: Procedure was tolerated well Procedure was tolerated well N/A Debridement [name] Response: 8.8x8.1x0.1 1.4x1.2x0.1 N/A Post Debridement Measurements L x W x D (cm) 5.598 0.132 N/A Post Debridement Volume: (cm) Category/Stage III N/A N/A Post Debridement Stage: [name] ([id number]) [id number] Page 3 of 8 Scarring: Yes No Abnormalities Noted N/A Periwound Skin Texture: Excoriation: No Induration: No Callus: No Crepitus: No Rash: No Dry/Scaly: Yes Maceration: No N/A Periwound Skin Moisture: Maceration: No Dry/Scaly: No Atrophie Blanche: No No Abnormalities Noted N/A Periwound Skin Color: Cyanosis: No Ecchymosis: No Erythema: No Hemosiderin Staining: No Mottled: No Pallor: No Rubor: No No Abnormality No Abnormality N/A Temperature: Yes Yes N/A Tenderness on Palpation: Debridement Debridement N/A Procedures Performed: [name] Notes Electronic Signature(s) Signed: 09/05/2022 10:58:36 AM By: Duanne Guess MD FACS Entered By: Duanne Guess on 09/05/2022 10:58:36 -------------------------------------------------------------------------------- Multi-Disciplinary Care Plan Details Patient Name: Date of Service: [name]. 09/05/2022 10:15 A M Medical Record Number: [id number] Patient Account Number: [id number] Date of Birth/Sex: Treating RN: 07-21-1950 (72 y.o. [name] Primary Care Toribio Seiber: [name] Other Clinician: Referring Aika Brzoska: Treating Neela Zecca/Extender: [name] in [name]: 24 Multidisciplinary Care Plan reviewed with physician Active Inactive Nutrition Nursing Diagnoses: Potential for alteratiion in Nutrition/Potential for imbalanced nutrition Goals: Patient/caregiver verbalizes understanding of need to maintain therapeutic glucose control per primary care physician Date  Initiated: 03/19/2022 Target Resolution Date: 11/08/2022 Goal Status: Active Interventions: Provide education on elevated blood sugars and impact on wound healing [name] Activities: Dietary management education, guidance and counseling : 03/19/2022 Notes: Wound/Skin Impairment Nursing Diagnoses: Knowledge deficit related to ulceration/compromised skin integrity Goals: Ulcer/skin breakdown will have a volume reduction of 30% by week 4 Date Initiated: 03/19/2022 Target Resolution Date: 11/08/2022 Goal Status: Active Interventions: Assess ulceration(s) every visit Provide education on ulcer and skin care [name] Activities: Skin care regimen initiated : 03/19/2022 [name], [name] ([id number]) [id number].pdf Page 4 of 8 Notes: Electronic Signature(s) Signed: 09/08/2022 4:01:24 PM By: [name] Entered By: [name] on 09/05/2022 10:34:41 -------------------------------------------------------------------------------- Pain Assessment Details Patient Name: Date of Service: [name] 09/05/2022 10:15 A M Medical Record Number: [id number] Patient Account Number: [id number] Date of Birth/Sex: Treating RN: 27-Nov-1950 (72 y.o. [name] Primary Care Datron Brakebill: [name] Other Clinician: Referring Londin Antone: Treating Son Barkan/Extender: [name]: 24 Active Problems Location of Pain Severity and Description of Pain Patient Has Paino Yes Site Locations Pain Location: Pain in Ulcers With Dressing Change: Yes Duration of the Pain. Constant /  Intermittento Constant Rate the pain. Current Pain Level: 5 Worst Pain Level: 5 Least Pain Level: 5 Character of Pain Describe the Pain: Tender Pain Management and Medication Current Pain Management: Electronic Signature(s) Signed: 09/08/2022 4:01:24 PM By: [name] Entered By: [name] on 09/05/2022  10:27:12 -------------------------------------------------------------------------------- Patient/Caregiver Education Details Patient Name: Date of Service: [name] 4/26/2024andnbsp10:15 A M Medical Record Number: [id number] Patient Account Number: [id number] Date of Birth/Gender: Treating RN: 1951-02-15 (72 y.o. [name] Primary Care Physician: [name] Other Clinician: Referring Physician: Treating Physician/Extender: [name] in [name]: 24 Education Assessment Education Provided To: Patient Education Topics Provided Wound/Skin Impairment: Methods: Explain/Verbal [name], [name] ([id number]) [id number] Page 5 of 8 Responses: Return demonstration correctly Electronic Signature(s) Signed: 09/05/2022 4:37:07 PM By: Karie Schwalbe RN Entered By: Karie Schwalbe on 09/05/2022 16:02:19 -------------------------------------------------------------------------------- Wound Assessment Details Patient Name: Date of Service: [name] 09/05/2022 10:15 A M Medical Record Number: [id number] Patient Account Number: [id number] Date of Birth/Sex: Treating RN: 15-Dec-1950 (72 y.o. [name] Primary Care Axelle Szwed: [name] Other Clinician: Referring Sheleen Conchas: Treating Demetric Parslow/Extender: [name]: 24 Wound Status Wound Number: 3 Primary Pressure Ulcer Etiology: Wound Location: Left Gluteus Wound Open Wounding Event: Gradually Appeared Status: Date Acquired: 05/14/2021 Comorbid Congestive Heart Failure, Hypertension, Type I Diabetes, Weeks Of [name]: 24 History: Neuropathy, Paraplegia Clustered Wound: No Photos Wound Measurements Length: (cm) 8.8 Width: (cm) 8.1 Depth: (cm) 0.1 Area: (cm) 55.983 Volume: (cm) 5.598 % Reduction in Area: 66.5% % Reduction in Volume: 66.5% Epithelialization: Small (1-33%) Tunneling: No Undermining: No Wound  Description Classification: Category/Stage III Wound Margin: Distinct, outline attached Exudate Amount: Medium Exudate Type: Serosanguineous Exudate Color: red, brown Foul Odor After Cleansing: No Slough/Fibrino Yes Wound Bed Granulation Amount: Medium (34-66%) Exposed Structure Granulation Quality: Red, Hyper-granulation, Friable Fascia Exposed: No Necrotic Amount: Medium (34-66%) Fat Layer (Subcutaneous Tissue) Exposed: Yes Necrotic Quality: Adherent Slough Tendon Exposed: No Muscle Exposed: No Joint Exposed: No Bone Exposed: No Periwound Skin Texture Texture Color No Abnormalities Noted: No No Abnormalities Noted: No Callus: No Atrophie Blanche: No Crepitus: No Cyanosis: No Excoriation: No Ecchymosis: No Induration: No Erythema: No Rash: No Hemosiderin Staining: No Scarring: Yes Mottled: No [name], [name] ([id number]) [id number] Page 6 of 8 Pallor: No Moisture Rubor: No No Abnormalities Noted: No Dry / Scaly: Yes Temperature / Pain Maceration: No Temperature: No Abnormality Tenderness on Palpation: Yes [name] Notes Wound #3 (Gluteus) Wound Laterality: Left Cleanser Soap and Water Discharge Instruction: May shower and wash wound with dial antibacterial soap and water prior to dressing change. Wound Cleanser Discharge Instruction: Cleanse the wound with wound cleanser prior to applying a clean dressing using gauze sponges, not tissue or cotton balls. Peri-Wound Care Zinc Oxide Ointment 30g tube Discharge Instruction: Apply Zinc Oxide to periwound with each dressing change Topical Gentamicin Discharge Instruction: As directed by physician Primary Dressing Sorbact Superabsorbent Dressing, 8x8 (in/in) Discharge Instruction: Needs 2 for the wound Secondary Dressing ABD Pad, 8x10 Discharge Instruction: Apply over primary dressing as directed. Secured With [employer] T 2x10 (in/yd) ape Discharge  Instruction: Secure with tape as directed. Compression Wrap Compression Stockings Add-Ons Electronic Signature(s) Signed: 09/08/2022 4:01:24 PM By: [name] Entered By: [name] on 09/05/2022 10:36:47 -------------------------------------------------------------------------------- Wound Assessment Details Patient Name: Date of Service: [name] 09/05/2022 10:15 A M Medical Record Number: [id number] Patient Account Number: [id number] Date of Birth/Sex: Treating RN: 1951/02/12 (72  y.o. [name] Primary Care Tallula Grindle: [name] Other Clinician: Referring Cleve Paolillo: Treating Barbara Keng/Extender: [name]: 24 Wound Status Wound Number: 5 Primary Lesion Etiology: Wound Location: Scrotum Wound Open Wounding Event: Gradually Appeared Status: Date Acquired: 09/05/2022 Comorbid Congestive Heart Failure, Hypertension, Type I Diabetes, Weeks Of [name]: 0 History: Neuropathy, Paraplegia Clustered Wound: No Photos [name], [name] ([id number]) [id number] Page 7 of 8 Wound Measurements Length: (cm) 1.4 Width: (cm) 1.2 Depth: (cm) 0.1 Area: (cm) 1.319 Volume: (cm) 0.132 % Reduction in Area: % Reduction in Volume: Epithelialization: None Tunneling: No Undermining: No Wound Description Classification: Full Thickness Without Exposed Support Structures Wound Margin: Distinct, outline attached Exudate Amount: Medium Exudate Type: Serosanguineous Exudate Color: red, brown Foul Odor After Cleansing: No Slough/Fibrino No Wound Bed Granulation Amount: Medium (34-66%) Exposed Structure Granulation Quality: Red, Pink, Hyper-granulation Fascia Exposed: No Necrotic Amount: Medium (34-66%) Fat Layer (Subcutaneous Tissue) Exposed: Yes Necrotic Quality: Adherent Slough Tendon Exposed: No Muscle Exposed: No Joint Exposed: No Bone Exposed: No Periwound Skin Texture Texture Color No  Abnormalities Noted: Yes No Abnormalities Noted: Yes Moisture Temperature / Pain No Abnormalities Noted: No Temperature: No Abnormality Dry / Scaly: No Tenderness on Palpation: Yes Maceration: No [name] Notes Wound #5 (Scrotum) Cleanser Soap and Water Discharge Instruction: May shower and wash wound with dial antibacterial soap and water prior to dressing change. Wound Cleanser Discharge Instruction: Cleanse the wound with wound cleanser prior to applying a clean dressing using gauze sponges, not tissue or cotton balls. Peri-Wound Care Zinc Oxide Ointment 30g tube Discharge Instruction: Apply Zinc Oxide to periwound with each dressing change Topical Gentamicin Discharge Instruction: As directed by physician Primary Dressing Sorbact Superabsorbent Dressing, 8x8 (in/in) Discharge Instruction: Needs 2 for the wound Secondary Dressing ABD Pad, 8x10 Discharge Instruction: Apply over primary dressing as directed. Secured With [employer] T 2x10 (in/yd) ape Discharge Instruction: Secure with tape as directed. [name], [name] ([id number]) [id number] Page 8 of 8 Compression Wrap Compression Stockings Add-Ons Electronic Signature(s) Signed: 09/08/2022 4:01:24 PM By: [name] Entered By: [name] on 09/05/2022 10:36:30 -------------------------------------------------------------------------------- Vitals Details Patient Name: Date of Service: [name]. 09/05/2022 10:15 A M Medical Record Number: [id number] Patient Account Number: [id number] Date of Birth/Sex: Treating RN: 03-04-1951 (72 y.o. [name] Primary Care Natausha Jungwirth: [name] Other Clinician: Referring Bubber Rothert: Treating Cathaleen Korol/Extender: [name]: 24 Vital Signs Time Taken: 10:21 Temperature (F): 97.7 Height (in): 77 Pulse (bpm): 51 Source: Stated Respiratory Rate (breaths/min): 18 Weight  (lbs): 318 Blood Pressure (mmHg): 138/71 Source: Stated Capillary Blood Glucose (mg/dl): 528 Body Mass Index (BMI): 37.7 Reference Range: 80 - 120 mg / dl Electronic Signature(s) Signed: 09/08/2022 4:01:24 PM By: [name] Entered By: [name] on 09/05/2022 10:22:48

## 2022-09-28 ENCOUNTER — Other Ambulatory Visit: Payer: Self-pay

## 2022-09-28 ENCOUNTER — Encounter (HOSPITAL_COMMUNITY): Payer: Self-pay

## 2022-09-28 ENCOUNTER — Emergency Department (HOSPITAL_COMMUNITY)
Admission: EM | Admit: 2022-09-28 | Discharge: 2022-09-29 | Disposition: A | Discharge status: Home / Self Care | Payer: Medicare Other | Attending: Emergency Medicine | Admitting: Emergency Medicine

## 2022-09-28 ENCOUNTER — Emergency Department (HOSPITAL_COMMUNITY): Payer: Medicare Other

## 2022-09-28 DIAGNOSIS — I482 Chronic atrial fibrillation, unspecified: Secondary | ICD-10-CM

## 2022-09-28 DIAGNOSIS — R109 Unspecified abdominal pain: Secondary | ICD-10-CM | POA: Diagnosis not present

## 2022-09-28 DIAGNOSIS — E119 Type 2 diabetes mellitus without complications: Secondary | ICD-10-CM | POA: Diagnosis not present

## 2022-09-28 DIAGNOSIS — I10 Essential (primary) hypertension: Secondary | ICD-10-CM | POA: Diagnosis not present

## 2022-09-28 DIAGNOSIS — M79606 Pain in leg, unspecified: Secondary | ICD-10-CM | POA: Diagnosis present

## 2022-09-28 DIAGNOSIS — G629 Polyneuropathy, unspecified: Secondary | ICD-10-CM

## 2022-09-28 DIAGNOSIS — Z7984 Long term (current) use of oral hypoglycemic drugs: Secondary | ICD-10-CM | POA: Insufficient documentation

## 2022-09-28 LAB — URINALYSIS, ROUTINE W REFLEX MICROSCOPIC
Bacteria, UA: NONE SEEN
Bilirubin Urine: NEGATIVE
Glucose, UA: NEGATIVE mg/dL
Ketones, ur: NEGATIVE mg/dL
Nitrite: NEGATIVE
Protein, ur: NEGATIVE mg/dL
RBC / HPF: >50 RBC/hpf (ref 0–5)
Specific Gravity, Urine: 1.012 (ref 1.005–1.030)
WBC, UA: >50 WBC/hpf (ref 0–5)
pH: 8 (ref 5.0–8.0)

## 2022-09-28 LAB — CBC WITH DIFFERENTIAL/PLATELET
Abs Immature Granulocytes: 0.02 10*3/uL (ref 0.00–0.07)
Basophils Absolute: 0 10*3/uL (ref 0.0–0.1)
Basophils Relative: 0 %
Eosinophils Absolute: 0.3 10*3/uL (ref 0.0–0.5)
Eosinophils Relative: 3 %
HCT: 43 % (ref 39.0–52.0)
Hemoglobin: 13 g/dL (ref 13.0–17.0)
Immature Granulocytes: 0 %
Lymphocytes Relative: 9 %
Lymphs Abs: 0.8 10*3/uL (ref 0.7–4.0)
MCH: 26.7 pg (ref 26.0–34.0)
MCHC: 30.2 g/dL (ref 30.0–36.0)
MCV: 88.3 fL (ref 80.0–100.0)
Monocytes Absolute: 0.6 10*3/uL (ref 0.1–1.0)
Monocytes Relative: 6 %
Neutro Abs: 8.1 10*3/uL — ABNORMAL HIGH (ref 1.7–7.7)
Neutrophils Relative %: 82 %
Platelets: 323 10*3/uL (ref 150–400)
RBC: 4.87 MIL/uL (ref 4.22–5.81)
RDW: 15.4 % (ref 11.5–15.5)
WBC: 9.9 10*3/uL (ref 4.0–10.5)
nRBC: 0 % (ref 0.0–0.2)

## 2022-09-28 LAB — COMPREHENSIVE METABOLIC PANEL
ALT: 16 U/L (ref 0–44)
AST: 19 U/L (ref 15–41)
Albumin: 3.5 g/dL (ref 3.5–5.0)
Alkaline Phosphatase: 41 U/L (ref 38–126)
Anion gap: 8 (ref 5–15)
BUN: 12 mg/dL (ref 8–23)
CO2: 28 mmol/L (ref 22–32)
Calcium: 8.9 mg/dL (ref 8.9–10.3)
Chloride: 100 mmol/L (ref 98–111)
Creatinine, Ser: 0.63 mg/dL (ref 0.61–1.24)
GFR, Estimated: >60 mL/min (ref >60)
Glucose, Bld: 138 mg/dL — ABNORMAL HIGH (ref 70–99)
Potassium: 4.4 mmol/L (ref 3.5–5.1)
Sodium: 136 mmol/L (ref 135–145)
Total Bilirubin: 0.6 mg/dL (ref 0.3–1.2)
Total Protein: 7.1 g/dL (ref 6.5–8.1)

## 2022-09-28 MED ORDER — HYDROMORPHONE HCL 1 MG/ML IJ SOLN
0.5000 mg | Freq: Once | INTRAMUSCULAR | Status: AC
  Administered 2022-09-28: 0.5 mg via INTRAVENOUS
  Filled 2022-09-28: qty 1

## 2022-09-28 MED ORDER — IOHEXOL 300 MG/ML  SOLN
100.0000 mL | Freq: Once | INTRAMUSCULAR | Status: AC | PRN
  Administered 2022-09-28: 100 mL via INTRAVENOUS

## 2022-09-28 MED ORDER — HYDROMORPHONE HCL 1 MG/ML IJ SOLN
1.0000 mg | Freq: Once | INTRAMUSCULAR | Status: AC
  Administered 2022-09-28: 1 mg via INTRAVENOUS
  Filled 2022-09-28: qty 1

## 2022-09-28 MED ORDER — SODIUM CHLORIDE 0.9 % IV SOLN: INTRAVENOUS | Status: DC

## 2022-09-28 NOTE — ED Notes (Signed)
Urine culture sent to main lab.  

## 2022-09-28 NOTE — Discharge Instructions (Signed)
Make an appointment to follow-up with your primary care doctor.  Also continue your current pain medications as you have at home and call your pain management doctor in the morning to see if they can make some adjustments to help with what appears to be worsening neuropathy pain.  Workup here tonight to include labs CT scan abdomen and pelvis and chest x-ray without any acute findings.  The only concern was that the urinalysis was not completely normal but not classic for urinary tract infection it was sent for culture you will be contacted if that urine culture is positive for urinary tract infection.  As we discussed return for any new or worse symptoms.

## 2022-09-28 NOTE — ED Notes (Signed)
[facility] called for transport home. 

## 2022-09-28 NOTE — ED Provider Notes (Signed)
[place] EMERGENCY DEPARTMENT AT [facility] Provider Note   CSN: [id number] Arrival date & time: 09/28/22  1613     History  Chief Complaint  Patient presents with   Leg Pain    Bilateral     [name] is a 72 y.o. male.  Patient brought in by EMS from home for bilateral leg pain that begins at the anus and extends down to his feet.  This is not a new thing this has been present for a long time.  But however since the end of April beginning of May it has been getting worse.  His pain management doctor did adjust his medications on May 2.  Does not seem to help too much.  They feel that this is all secondary to diabetic neuropathy.  Patient is also known to have a left buttocks decubitus is followed by wound care.  That apparently has been doing pretty well.  Patient has not had any new falls or injuries.  Patient is completely bedridden.  Patient has had difficulty with urinary tract infections in the past.  Patient does urinate on his own he does not self cath.  Patient had a little bit of a cough but no fevers no chest pain no abdominal pain.  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  That is also long-term.  Patient has a history of seizures and he is on Lamictal and Keppra for that.  Patient also has diabetes and has chronic atrial fibrillation and is on Pradaxa for that.  Patient is on several pain medicines and call including long-acting narcotics as well as gabapentin.  Patient is no longer on oxycodone with Tylenol.  However has started to take just extra strength Tylenol along with his other narcotics he has no narcotics that have Tylenol in them.  EMS has been out of the house the last 2 days for this complaint.  It appears that is probably worsening of his chronic pain.  But they are here to see to make sure nothing else is of more concern.  Past medical history  significant for seizures deep vein thrombosis weakness of both legs peripheral neuropathy has had trouble with venous stasis in the past but the edema is markedly down.  And hyperlipidemia.  The history of the bowel perforation is a patent for foramen ovale chronic atrial fibrillation hypertension diabetes without complication and obesity.  Is a former smoker quit in 2015.  Patient denies any fevers denies any nausea or vomiting pain as mentioned above has had a little bit of a cough late which she thought was secondary to tree pollen.  In addition the pain does not really include his lumbar back area.  Patient still has movement of his feet.  Still is voiding appropriately.       Home Medications Prior to Admission medications   Medication Sig Start Date End Date Taking? Authorizing Provider  acetaminophen (TYLENOL) 500 MG tablet Take 500-1,000 mg by mouth every 6 (six) hours as needed for mild pain, moderate pain, fever or headache.   Yes [provider]  atorvastatin (LIPITOR) 10 MG tablet Take 10 mg by mouth at bedtime. 12/13/20  Yes [provider]  Calcium Carbonate-Vitamin D 600-400 MG-UNIT tablet Take 1 tablet by mouth daily.   Yes [provider]  Cholecalciferol (VITAMIN D3 PO) Take 1 capsule by mouth daily.  Yes [provider]  dabigatran (PRADAXA) 150 MG CAPS capsule TAKE 1 CAPSULE BY MOUTH TWICE A DAY 12/20/21  Yes Hilty, Lisette Abu, MD  DILT-XR 240 MG 24 hr capsule Take 240 mg by mouth daily. 10/28/21  Yes [provider]  doxepin (SINEQUAN) 75 MG capsule Take 150 mg by mouth at bedtime.    Yes [provider]  DULoxetine (CYMBALTA) 60 MG capsule Take 60 mg by mouth daily.   Yes [provider]  fenofibrate 160 MG tablet Take 160 mg by mouth daily.   Yes [provider]  gabapentin (NEURONTIN) 800 MG tablet Take 800 mg by mouth 3 (three) times daily. 09/11/22  Yes [provider]  glipiZIDE (GLUCOTROL XL)  2.5 MG 24 hr tablet Take 2.5 mg by mouth in the morning and at bedtime.  05/16/17  Yes [provider]  Lactobacillus (PROBIOTIC ACIDOPHILUS PO) Take 1 tablet by mouth daily.   Yes [provider]  lamoTRIgine (LAMICTAL) 150 MG tablet Take 1 tablet (150 mg total) by mouth 2 (two) times daily. 03/11/22  Yes Dohmeier, Porfirio Mylar, MD  levETIRAcetam (KEPPRA) 750 MG tablet Take 2 tablets (1,500 mg total) by mouth 2 (two) times daily. Please call and make overdue appt for further refills. 1st attempt 03/11/22  Yes Dohmeier, Porfirio Mylar, MD  losartan (COZAAR) 25 MG tablet Take 1 tablet (25 mg total) by mouth 2 (two) times daily. SCHEDULE OFFICE VISIT FOR FUTURE REFILLS. 01/17/22  Yes Hilty, Lisette Abu, MD  metFORMIN (GLUCOPHAGE) 1000 MG tablet Take 1,000 mg by mouth 2 (two) times daily. 12/18/20  Yes [provider]  Multiple Vitamin (MULTIVITAMIN WITH MINERALS) TABS Take 1 tablet by mouth daily.   Yes [provider]  naloxone (NARCAN) nasal spray 4 mg/0.1 mL Place 1 spray into the nose once. 08/29/20  Yes [provider]  Omega-3 Fatty Acids (FISH OIL) 1000 MG CAPS Take 1,000 mg by mouth 2 (two) times a day.   Yes [provider]  XTAMPZA ER 27 MG C12A Take 1 capsule by mouth every 12 (twelve) hours. 09/12/22  Yes [provider]  Alpha Lipoic Acid 200 MG CAPS Take 200 mg by mouth 2 (two) times daily. Patient not taking: Reported on 09/28/2022 03/11/22   Dohmeier, Porfirio Mylar, MD  Ferrous Sulfate (IRON PO) Take 1 tablet by mouth daily.    [provider]      Allergies    Penicillins    Review of Systems   Review of Systems  Constitutional:  Negative for chills and fever.  HENT:  Negative for ear pain and sore throat.   Eyes:  Negative for pain and visual disturbance.  Respiratory:  Positive for cough. Negative for shortness of breath.   Cardiovascular:  Negative for chest pain and palpitations.  Gastrointestinal:  Negative for abdominal pain and  vomiting.  Genitourinary:  Negative for dysuria and hematuria.  Musculoskeletal:  Negative for arthralgias and back pain.  Skin:  Negative for color change and rash.  Neurological:  Positive for weakness and numbness. Negative for seizures and syncope.  All other systems reviewed and are negative.   Physical Exam Updated Vital Signs BP 114/65   Pulse 86   Temp 98.4 F (36.9 C) (Oral)   Resp (!) 25   Ht 1.956 m (6\' 5" )   Wt (!) 144.2 kg   SpO2 (!) 86%   BMI 37.71 kg/m  Physical Exam Vitals and nursing note reviewed.  Constitutional:      General:  He is not in acute distress.    Appearance: Normal appearance. He is well-developed.  HENT:     Head: Normocephalic and atraumatic.     Mouth/Throat:     Comments: Slightly dry mucous membranes. Eyes:     Extraocular Movements: Extraocular movements intact.     Conjunctiva/sclera: Conjunctivae normal.     Pupils: Pupils are equal, round, and reactive to light.  Cardiovascular:     Rate and Rhythm: Normal rate. Rhythm irregular.     Heart sounds: No murmur heard. Pulmonary:     Effort: Pulmonary effort is normal. No respiratory distress.     Breath sounds: Normal breath sounds.  Abdominal:     General: There is no distension.     Palpations: Abdomen is soft.     Tenderness: There is no abdominal tenderness. There is no guarding.     Comments: Colostomy bag right lower quadrant.  Also mucous fistula left side of the abdomen.  Musculoskeletal:        General: No swelling.     Cervical back: Neck supple.     Comments: No significant swelling to both lower extremities.  Does have some's chronic skin changes.  Good movement of the feet.  Dorsalis pedis pulses 1+ bilaterally.  Left buttocks decubitus that is not very deep.  Just some superficial skin breakdown.  Not deep into the subcutaneous fat.  Certainly not involving any muscle.  No bone exposure.  Skin:    General: Skin is warm and dry.     Capillary Refill: Capillary refill  takes less than 2 seconds.  Neurological:     Mental Status: He is alert and oriented to person, place, and time. Mental status is at baseline.  Psychiatric:        Mood and Affect: Mood normal.     ED Results / Procedures / Treatments   Labs (all labs ordered are listed, but only abnormal results are displayed) Labs Reviewed  CBC WITH DIFFERENTIAL/PLATELET - Abnormal; Notable for the following components:      Result Value   Neutro Abs 8.1 (*)    All other components within normal limits  URINALYSIS, ROUTINE W REFLEX MICROSCOPIC - Abnormal; Notable for the following components:   APPearance CLOUDY (*)    Hgb urine dipstick MODERATE (*)    Leukocytes,Ua LARGE (*)    All other components within normal limits  COMPREHENSIVE METABOLIC PANEL - Abnormal; Notable for the following components:   Glucose, Bld 138 (*)    All other components within normal limits  URINE CULTURE    EKG EKG Interpretation  Date/Time:  Sunday Sep 28 2022 17:46:53 EDT Ventricular Rate:  122 PR Interval:    QRS Duration: 97 QT Interval:  354 QTC Calculation: 484 R Axis:   -60 Text Interpretation: Atrial fibrillation Ventricular premature complex Left axis deviation Low voltage, precordial leads Borderline prolonged QT interval Confirmed by Vanetta Mulders [phone]) on 09/28/2022 6:05:54 PM  Radiology CT ABDOMEN PELVIS W CONTRAST  Result Date: 09/28/2022 CLINICAL DATA:  Abdominal pain EXAM: CT ABDOMEN AND PELVIS WITH CONTRAST TECHNIQUE: Multidetector CT imaging of the abdomen and pelvis was performed using the standard protocol following bolus administration of intravenous contrast. RADIATION DOSE REDUCTION: This exam was performed according to the departmental dose-optimization program which includes automated exposure control, adjustment of the mA and/or kV according to patient size and/or use of iterative reconstruction technique. CONTRAST:  OMNIPAQUE IOHEXOL 300 MG/ML  SOLN COMPARISON:  01/05/2022  FINDINGS: Lower chest: Chronic  scarring at the lung bases. No acute pleural or parenchymal lung disease. Hepatobiliary: Punctate calcified gallstone without evidence of acute cholecystitis. The gallbladder is moderately distended without gallbladder wall thickening or pericholecystic fluid. Diffuse hepatic steatosis. No focal liver abnormality. Pancreas: Unremarkable. No pancreatic ductal dilatation or surrounding inflammatory changes. Spleen: Stable 1.9 cm splenic hypodensity likely a cyst. Otherwise the spleen is normal in size and appearance. Adrenals/Urinary Tract: Stable nonobstructing 11 mm right renal calculus. Stable benign 1.3 cm right renal cortical cyst, which does not require specific follow-up. The left kidney is unremarkable. The adrenals are unremarkable. Bladder is only minimally distended, with diffuse nonspecific bladder wall thickening. Cystitis is not excluded. Stomach/Bowel: Stable postsurgical changes from right hemicolectomy, right mid abdominal diverting ileostomy, and left mid abdominal mucous fistula. No bowel obstruction or ileus. No bowel wall thickening or inflammatory change. Vascular/Lymphatic: Aortic atherosclerosis. Linear radiodensity within the left external iliac vein likely retained catheter or wire fragment, stable. No pathologic adenopathy. Reproductive: Prostate is unremarkable. Other: No free fluid or free intraperitoneal gas. No abdominal wall hernia. Musculoskeletal: There are no acute or destructive bony lesions. Subcutaneous fat stranding within the left gluteal region overlying the posterior left hip and left ischium. No frank ulceration or underlying osteomyelitis. Reconstructed images demonstrate no additional findings. IMPRESSION: 1. Extensive postsurgical changes as above. No bowel obstruction or ileus. 2. Nonspecific bladder wall thickening given decompressed state. If cystitis is a concern, correlation with urinalysis is recommended. 3. Cholelithiasis without CT  evidence for cholecystitis. 4. Hepatic steatosis. 5. Stable nonobstructing 11 mm right renal calculus. No obstructive uropathy within either kidney. 6.  Aortic Atherosclerosis (ICD10-I70.0). Electronically Signed   By: Sharlet Salina M.D.   On: 09/28/2022 21:55   DG Chest Port 1 View  Result Date: 09/28/2022 CLINICAL DATA:  Bilateral lower extremity pain, cough EXAM: PORTABLE CHEST 1 VIEW COMPARISON:  01/04/2022 FINDINGS: Single frontal view of the chest demonstrates stable enlargement of the cardiac silhouette. Chronic central vascular congestion without acute airspace disease, effusion, or pneumothorax. No acute bony abnormalities. IMPRESSION: 1. Chronic enlarged cardiac silhouette and central vascular congestion. No overt edema. Electronically Signed   By: Sharlet Salina M.D.   On: 09/28/2022 18:07    Procedures Procedures    Medications Ordered in ED Medications  0.9 %  sodium chloride infusion ( Intravenous New Bag/Given 09/28/22 1822)  HYDROmorphone (DILAUDID) injection 1 mg (has no administration in time range)  HYDROmorphone (DILAUDID) injection 0.5 mg (0.5 mg Intravenous Given 09/28/22 1822)  iohexol (OMNIPAQUE) 300 MG/ML solution 100 mL (100 mLs Intravenous Contrast Given 09/28/22 2141)  HYDROmorphone (DILAUDID) injection 1 mg (1 mg Intravenous Given 09/28/22 2140)    ED Course/ Medical Decision Making/ A&P                             Medical Decision Making Amount and/or Complexity of Data Reviewed Labs: ordered. Radiology: ordered.  Risk Prescription drug management.   Patient count with an extensive workup here.  Included CT abdomen and pelvis and chest x-ray.  Chest x-ray without evidence of any pulmonary edema.  CT abdomen and pelvis without any acute findings.  No evidence that the left buttocks of any osteomyelitis or deep space infection.  No intra-abdominal abnormalities.  Labs without significant abnormalities complete metabolic panel normal including liver function  test.  Electrolytes normal.  CBC no leukocytosis hemoglobin 13.0 platelets 323.  Urinalysis was cloudy there was large leukocytes RBCs were greater than 50  and white blood cells greater than 50 but there was no bacteria.  And squamous epithelia was 0-5.  Patient also without any dysuria.  Patient has had urinary tract infections in the past does not feel like he has 1 he has had some strange bugs that have grown.  Based on that we will send for urine culture and wait on results.  Will not empirically start treating with antibiotics.  Patient not febrile here.  Temp was 97.8.  But could be an early urinary tract infection.  Overall I think the pain problem is an exacerbation of his neuropathy.  Treated here with IV hydromorphone.  Patient looks a lot more comfortable but he states not really any better.  Will give a dose of right prior to discharge is hopefully hold him tonight.  And then have him contact his pain management doctor in the morning to see what adjustments they can make on his medications.  Recommended follow-up with his primary care doctor and also to follow-up on the urine culture.  Patient nontoxic no acute distress.   Final Clinical Impression(s) / ED Diagnoses Final diagnoses:  Chronic atrial fibrillation (HCC)  Neuropathy    Rx / DC Orders ED Discharge Orders     None         Vanetta Mulders, MD 09/28/22 2255

## 2022-09-28 NOTE — ED Notes (Signed)
Per [facility]- recollect needed on light green specimen. Mellody Dance Paramedic advised. [employer]

## 2022-09-28 NOTE — ED Notes (Signed)
Repeat green top sent to lab

## 2022-09-28 NOTE — ED Triage Notes (Signed)
Pt is coming from home. PT states that he has had bilateral leg pain that begins at his anus and extends to his feet. No sentential events, falls, or new injuries. Pt does have a sacral wound that he sees the wound clinic for, the clinic states the wound is healing well. Pt does have a caregiver at home.

## 2022-09-30 LAB — URINE CULTURE: Culture: 40000 — AB

## 2022-10-03 ENCOUNTER — Encounter (HOSPITAL_BASED_OUTPATIENT_CLINIC_OR_DEPARTMENT_OTHER): Payer: Medicare Other | Attending: General Surgery | Admitting: General Surgery

## 2022-10-03 DIAGNOSIS — L98492 Non-pressure chronic ulcer of skin of other sites with fat layer exposed: Secondary | ICD-10-CM | POA: Diagnosis not present

## 2022-10-03 DIAGNOSIS — E11622 Type 2 diabetes mellitus with other skin ulcer: Secondary | ICD-10-CM | POA: Diagnosis not present

## 2022-10-03 DIAGNOSIS — E1142 Type 2 diabetes mellitus with diabetic polyneuropathy: Secondary | ICD-10-CM | POA: Diagnosis not present

## 2022-10-03 DIAGNOSIS — Z6837 Body mass index (BMI) 37.0-37.9, adult: Secondary | ICD-10-CM | POA: Insufficient documentation

## 2022-10-03 DIAGNOSIS — Z932 Ileostomy status: Secondary | ICD-10-CM | POA: Insufficient documentation

## 2022-10-03 DIAGNOSIS — L89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, stage 3: Secondary | ICD-10-CM | POA: Diagnosis present

## 2022-10-31 ENCOUNTER — Ambulatory Visit (HOSPITAL_BASED_OUTPATIENT_CLINIC_OR_DEPARTMENT_OTHER): Payer: Medicare Other | Admitting: General Surgery

## 2022-11-10 NOTE — Progress Notes (Signed)
RODDIE, SPOSATO ([id number]) [id number] Page 1 of 11 Visit Report for 10/03/2022 Chief Complaint Document Details Patient Name: Date of Service: [name] 10/03/2022 10:30 A M Medical Record Number: [id number] Patient Account Number: [id number] Date of Birth/Sex: Treating RN: 10-28-1950 (72 y.o. M) Primary Care Provider: Caffie Damme Other Clinician: Referring Provider: Treating Provider/Extender: Haze Rushing in Treatment: 28 Information Obtained from: Patient Chief Complaint Here for follow up abdominal wound present following laparotomy, colostomy. 03/19/2022: Here for pressure ulcer on left gluteus and ulcer adjacent to ileostomy Electronic Signature(s) Signed: 10/03/2022 11:22:20 AM By: Duanne Guess MD FACS Entered By: Duanne Guess on 10/03/2022 11:22:19 -------------------------------------------------------------------------------- Debridement Details Patient Name: Date of Service: [name]. 10/03/2022 10:30 A M Medical Record Number: [id number] Patient Account Number: [id number] Date of Birth/Sex: Treating RN: 1951/03/07 (72 y.o. [name] Primary Care Provider: Caffie Damme Other Clinician: Referring Provider: Treating Provider/Extender: Haze Rushing in Treatment: 28 Debridement Performed for Assessment: Wound #3 Left Gluteus Performed By: Physician Duanne Guess, MD Debridement Type: Debridement Level of Consciousness (Pre-procedure): Awake and Alert Pre-procedure Verification/Time Out Yes - 11:00 Taken: Start Time: 11:01 Pain Control: Lidocaine 4% Topical Solution Percent of Wound Bed Debrided: 100% T Area Debrided (cm): otal 42.36 Tissue and other material debrided: Slough, Slough Level: Non-Viable Tissue Debridement Description: Selective/Open Wound Instrument: Curette Bleeding: Minimum Hemostasis Achieved: Pressure End Time: 11:05 Procedural Pain:  0 Post Procedural Pain: 0 Response to Treatment: Procedure was tolerated well Level of Consciousness (Post- Awake and Alert procedure): Post Debridement Measurements of Total Wound Length: (cm) 7.6 Stage: Category/Stage III Width: (cm) 7.1 Depth: (cm) 0.1 Volume: (cm) 4.238 [name], [name] ([id number]) [id number] Page 2 of 11 Character of Wound/Ulcer Post Debridement: Improved Post Procedure Diagnosis Same as Pre-procedure Notes Scribed for Dr Lady Gary by Brenton Grills RN Electronic Signature(s) Signed: 10/03/2022 12:25:35 PM By: Duanne Guess MD FACS Signed: 10/08/2022 10:30:00 AM By: Brenton Grills Entered By: Brenton Grills on 10/03/2022 11:06:49 -------------------------------------------------------------------------------- Debridement Details Patient Name: Date of Service: [name]. 10/03/2022 10:30 A M Medical Record Number: [id number] Patient Account Number: [id number] Date of Birth/Sex: Treating RN: 08/20/1950 (72 y.o. [name] Primary Care Provider: Caffie Damme Other Clinician: Referring Provider: Treating Provider/Extender: Haze Rushing in Treatment: 28 Debridement Performed for Assessment: Wound #5 Scrotum Performed By: Physician Duanne Guess, MD Debridement Type: Debridement Level of Consciousness (Pre-procedure): Awake and Alert Pre-procedure Verification/Time Out Yes - 11:00 Taken: Start Time: 11:01 Pain Control: Lidocaine 4% T opical Solution Percent of Wound Bed Debrided: 100% T Area Debrided (cm): otal 2.97 Tissue and other material debrided: Non-Viable, Slough, Slough Level: Non-Viable Tissue Debridement Description: Selective/Open Wound Instrument: Curette Bleeding: Minimum Hemostasis Achieved: Pressure End Time: 11:05 Procedural Pain: 0 Post Procedural Pain: 0 Response to Treatment: Procedure was tolerated well Level of Consciousness (Post- Awake and  Alert procedure): Post Debridement Measurements of Total Wound Length: (cm) 4.2 Width: (cm) 0.9 Depth: (cm) 0.1 Volume: (cm) 0.297 Character of Wound/Ulcer Post Debridement: Improved Post Procedure Diagnosis Same as Pre-procedure Notes Scribed for Dr Lady Gary by Brenton Grills RN Electronic Signature(s) Signed: 10/03/2022 12:25:35 PM By: Duanne Guess MD FACS Signed: 10/08/2022 10:30:00 AM By: Brenton Grills Entered By: Brenton Grills on 10/03/2022 11:07:33 [name] ([id number]) [id number] Page 3 of 11 -------------------------------------------------------------------------------- HPI Details Patient Name: Date of Service: [name] 10/03/2022 10:30 A M Medical Record Number: [id number] Patient Account Number: [id number] Date of Birth/Sex:  Treating RN: 1950/06/05 (72 y.o. M) Primary Care Provider: Caffie Damme Other Clinician: Referring Provider: Treating Provider/Extender: Murvin Natal Weeks in Treatment: 28 History of Present Illness HPI Description: On medihoney with border dressing due to stalling on collagen and concern ABD was pulling off skin. 09/11/14 Nearly healed, counseled given the thin attenuated scar skin I expect will have some recurrence wounds with slight trauma or maceration, however much improved today. cont ABD pads and medihoney. 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 He resides with his sister and is on a regular hospital bed. The 2 of them reported that for about the last year, he has had ulceration adjacent to his ileostomy as well as on his left gluteus and  upper posterior thigh. Most recent hemoglobin A1c was 6.3%. On his left gluteus, there is a stage II pressure ulcer with surrounding tissue maceration and excoriation. There is a little slough on the wound surface. Adjacent to his ileostomy, associated with his appliance, there is an ulcer that extends into the fat layer. It is clean without any slough or necrotic tissue. 04/10/2022: The left gluteal ulcer has expanded and is quite a bit larger today. There is slough and eschar accumulation. The patient's sister reports that the ileostomy associated wound got better and so they did not go to see the outpatient ostomy nurse. They have not received the low-air-loss mattress. 05/01/2022: Apparently the low-air-loss mattress has been denied by his insurance, but they are willing to cover a gel mattress overlay. The ileostomy-area wound has contracted further, per the patient's sister; we are not managing this wound at this time. The left gluteal ulcer seems to be about the same size, but there is extensive slough on all of the open surfaces. His skin is extremely friable. 05/22/2022: The gel mattress overlay was finally delivered last week. The left gluteal ulcer has deteriorated somewhat. There is slough on all of the open surfaces and the total area of the wound is larger. It has a funky odor to it today. 06/13/2022: The gluteal wound is smaller and has substantially less slough. Epithelium is filling in around the margins. The odor that was present at his last visit has dissipated. 07/03/2022: The gluteal wound is a little bit smaller again today. It seems more superficial and just has a light layer of slough on the surface. 08/07/2022: No significant change to the gluteal wound. The patient's sister does report that it has bled less and seems less friable. 09/05/2022: The gluteal wound looks a little bit better, with smaller dimensions and less friable tissue. He has had some breakdown on his perineum,  adjacent to his scrotum. It appears similar in nature to the gluteal wound. 10/03/2022: The wounds all look a little bit more superficial today. They are less friable. He does have a lot of scaly dry skin around each of the wound areas. There is slough on the wound surfaces. Electronic Signature(s) Signed: 10/03/2022 11:23:18 AM By: Duanne Guess MD FACS Entered By: Duanne Guess on 10/03/2022 11:23:18 -------------------------------------------------------------------------------- Physical Exam Details Patient Name: Date of Service: [name] 10/03/2022 10:30 A M Medical Record Number: [id number] Patient Account Number: [id number] Date of Birth/Sex: Treating RN: 1950/08/24 (72 y.o. M) Primary Care  Provider: Caffie Damme Other Clinician: Referring Provider: Treating Provider/Extender: Haze Rushing in Treatment: 28 Constitutional . Slightly bradycardic. . . no acute distress. [name], [name] ([id number]) [id number] Page 4 of 11 Respiratory Normal work of breathing on room air. Notes 10/03/2022: The wounds all look a little bit more superficial today. They are less friable. He does have a lot of scaly dry skin around each of the wound areas. There is slough on the wound surfaces. Electronic Signature(s) Signed: 10/03/2022 11:24:02 AM By: Duanne Guess MD FACS Entered By: Duanne Guess on 10/03/2022 11:24:02 -------------------------------------------------------------------------------- Physician Orders Details Patient Name: Date of Service: [name]. 10/03/2022 10:30 A M Medical Record Number: [id number] Patient Account Number: [id number] Date of Birth/Sex: Treating RN: [birth year] (72 y.o. 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 - *****STRETCHER*******Dr. Lady Gary Rm 3 Anesthetic Wound #3 Left Gluteus (In clinic) Topical Lidocaine 4% applied to wound bed Bathing/ Shower/ Hygiene May shower and wash wound with soap and water. Off-Loading Wound #3 Left Gluteus Gel mattress overlay (Group 1) Turn and reposition every 2 hours Wound Treatment Wound #3 - Gluteus Wound Laterality: Left Cleanser: Soap and Water 1 x Per Day/30 Days Discharge Instructions: May shower and wash wound with dial antibacterial soap and water prior to dressing change. Cleanser: Wound Cleanser (Generic) 1 x Per Day/30 Days Discharge Instructions: Cleanse the wound with wound cleanser prior to applying a clean dressing using gauze sponges, not tissue or cotton balls. Peri-Wound Care: Zinc Oxide Ointment 30g tube (Generic) 1 x Per Day/30 Days Discharge Instructions: Apply Zinc Oxide to periwound with each dressing change Topical: Gentamicin 1 x Per Day/30 Days Discharge Instructions: As directed by physician Prim Dressing: Sorbact Superabsorbent Dressing, 8x8 (in/in) (Generic) 1 x Per Day/30 Days ary Discharge Instructions: Needs 2 for the wound Secondary Dressing: ABD Pad, 8x10 (Generic) 1 x Per Day/30 Days Discharge Instructions: Apply over primary dressing as directed. [name], [name] ([id number]) [id number] Page 5 of 11 Secured With: 86M [employer] Scientist, research (life sciences) Surgical T 2x10 (in/yd) (Generic) 1 x Per Day/30 Days ape Discharge Instructions: Secure with tape as directed. Wound #5 - Scrotum Cleanser: Soap and Water 1 x Per Day/30 Days Discharge Instructions: May shower and wash wound with dial antibacterial  soap and water prior to dressing change. Cleanser: Wound Cleanser (Generic) 1 x Per Day/30 Days Discharge Instructions: Cleanse the wound with wound cleanser prior to applying a clean dressing using gauze sponges, not tissue or cotton balls. Peri-Wound Care: Zinc Oxide Ointment 30g tube (Generic) 1 x Per Day/30 Days Discharge Instructions: Apply Zinc Oxide to periwound with each dressing change Topical: Gentamicin 1 x Per Day/30 Days Discharge Instructions: As directed by physician Prim Dressing: Sorbact Superabsorbent Dressing, 8x8 (in/in) (Generic) 1 x Per Day/30 Days ary Discharge Instructions: Needs 2 for the wound Secondary Dressing: ABD Pad, 8x10 (Generic) 1 x Per Day/30 Days Discharge Instructions: Apply over primary dressing as directed. Secured With: 86M [employer] Scientist, research (life sciences) Surgical T 2x10 (in/yd) (Generic) 1 x Per Day/30 Days ape Discharge Instructions: Secure with tape as directed. Electronic Signature(s) Signed: 10/03/2022 12:25:35 PM By: Lady Gary,  Victorino Dike MD FACS Entered By: Duanne Guess on 10/03/2022 11:24:29 -------------------------------------------------------------------------------- Problem List Details Patient Name: Date of Service: [name] 10/03/2022 10:30 A M Medical Record Number: [id number] Patient Account Number: [id number] Date of Birth/Sex: Treating RN: [birth year] (72 y.o. [name] Primary Care Provider: Caffie Damme Other Clinician: Referring Provider: Treating Provider/Extender: Murvin Natal Weeks in Treatment: 28 Active Problems ICD-10 Encounter Code Description Active Date MDM Diagnosis [phone] Pressure ulcer of left buttock, stage 3 03/19/2022 No Yes E66.01 Morbid (severe) obesity due to excess calories 03/19/2022 No Yes G62.89 Other specified polyneuropathies 03/19/2022 No Yes Z93.2 Ileostomy status 03/19/2022 No Yes E11.622 Type 2 diabetes mellitus with other skin ulcer 03/19/2022 No Yes [name], [name]  ([id number]) [id number] Page 6 of 11 Inactive Problems ICD-10 Code Description Active Date Inactive Date L98.492 Non-pressure chronic ulcer of skin of other sites with fat layer exposed 03/19/2022 03/19/2022 Resolved Problems Electronic Signature(s) Signed: 10/03/2022 11:22:02 AM By: Duanne Guess MD FACS Entered By: Duanne Guess on 10/03/2022 11:22:01 -------------------------------------------------------------------------------- Progress Note Details Patient Name: Date of Service: [name]. 10/03/2022 10:30 A M Medical Record Number: [id number] Patient Account Number: [id number] Date of Birth/Sex: Treating RN: 1950/06/15 (72 y.o. M) Primary Care Provider: Caffie Damme Other Clinician: Referring Provider: Treating Provider/Extender: Haze Rushing in Treatment: 28 Subjective Chief Complaint Information obtained from Patient Here for follow up abdominal wound present following laparotomy, colostomy. 03/19/2022: Here for pressure ulcer on left gluteus and ulcer adjacent to ileostomy History of Present Illness (HPI) On medihoney with border dressing due to stalling on collagen and concern ABD was pulling off skin. 09/11/14 Nearly healed, counseled given the thin attenuated scar skin I expect will have some recurrence wounds with slight trauma or maceration, however much improved today. cont ABD pads and medihoney. 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 He resides with his sister and is on a regular hospital bed. The 2 of them reported that for about the last year, he has had ulceration  adjacent to his ileostomy as well as on his left gluteus and upper posterior thigh. Most recent hemoglobin A1c was 6.3%. On his left gluteus, there is a stage II pressure ulcer with surrounding tissue maceration and excoriation. There is a little slough on the wound surface. Adjacent to his ileostomy, associated with his appliance, there is an ulcer that extends into the fat layer. It is clean without any slough or necrotic tissue. 04/10/2022: The left gluteal ulcer has expanded and is quite a bit larger today. There is slough and eschar accumulation. The patient's sister reports that the ileostomy associated wound got better and so they did not go to see the outpatient ostomy nurse. They have not received the low-air-loss mattress. 05/01/2022: Apparently the low-air-loss mattress has been denied by his insurance, but they are willing to cover a gel mattress overlay. The ileostomy-area wound has contracted further, per the patient's sister; we are not managing this wound at this time. The left gluteal ulcer seems to be about the same size, but there is extensive slough on all of the open surfaces. His skin is extremely friable. 05/22/2022: The gel mattress overlay was finally delivered last week. The left gluteal ulcer has  deteriorated somewhat. There is slough on all of the open surfaces and the total area of the wound is larger. It has a funky odor to it today. 06/13/2022: The gluteal wound is smaller and has substantially less slough. Epithelium is filling in around the margins. The odor that was present at his last visit has dissipated. 07/03/2022: The gluteal wound is a little bit smaller again today. It seems more superficial and just has a light layer of slough on the surface. 08/07/2022: No significant change to the gluteal wound. The patient's sister does report that it has bled less and seems less friable. 09/05/2022: The gluteal wound looks a little bit better, with smaller dimensions and less  friable tissue. He has had some breakdown on his perineum, adjacent to his scrotum. It appears similar in nature to the gluteal wound. 10/03/2022: The wounds all look a little bit more superficial today. They are less friable. He does have a lot of scaly dry skin around each of the wound areas. There is slough on the wound surfaces. Patient History Information obtained from Patient. Family History Cancer - Father,Mother, Diabetes - Father,Mother,Siblings, Heart Disease - Mother,Siblings, Hypertension - Father,Siblings, Kidney Disease - Siblings. [name], [name] ([id number]) [id number] Page 7 of 83 Social History Former smoker - ended on 08/10/2013, Marital Status - Single, Alcohol Use - Rarely, Drug Use - No History, Caffeine Use - Rarely. Medical History Constitutional Symptoms ([name]) Patient has history of Congestive Heart Failure Eyes Denies history of Cataracts, Glaucoma, Optic Neuritis Ear/Nose/Mouth/Throat Denies history of Chronic sinus problems/congestion, Middle ear problems Cardiovascular Patient has history of Hypertension Endocrine Patient has history of Type I Diabetes Denies history of Type 1 Diabetes Genitourinary Denies history of End Stage Renal Disease Neurologic Patient has history of Neuropathy, Paraplegia Psychiatric Denies history of Anorexia/bulimia, Confinement Anxiety Medical A Surgical History Notes nd Cardiovascular atrial fibrillation Musculoskeletal arthritis Objective Constitutional Slightly bradycardic. no acute distress. Vitals Time Taken: 10:30 AM, Height: 77 in, Weight: 318 lbs, BMI: 37.7, Temperature: 98 F, Pulse: 59 bpm, Respiratory Rate: 18 breaths/min, Blood Pressure: 138/79 mmHg, Capillary Blood Glucose: 180 mg/dl. Respiratory Normal work of breathing on room air. [name] Notes: 10/03/2022: The wounds all look a little bit more superficial today. They are less friable. He does have a lot of scaly dry  skin around each of the wound areas. There is slough on the wound surfaces. Integumentary (Hair, Skin) Wound #3 status is Open. Original cause of wound was Gradually Appeared. The date acquired was: 05/14/2021. The wound has been in treatment 28 weeks. The wound is located on the Left Gluteus. The wound measures 7.6cm length x 7.1cm width x 0.1cm depth; 42.38cm^2 area and 4.238cm^3 volume. There is Fat Layer (Subcutaneous Tissue) exposed. There is no tunneling or undermining noted. There is a medium amount of serosanguineous drainage noted. The wound margin is distinct with the outline attached to the wound base. There is medium (34-66%) red, friable, hyper - granulation within the wound bed. There is a medium (34-66%) amount of necrotic tissue within the wound bed including Adherent Slough. The periwound skin appearance exhibited: Scarring, Dry/Scaly. The periwound skin appearance did not exhibit: Callus, Crepitus, Excoriation, Induration, Rash, Maceration, Atrophie Blanche, Cyanosis, Ecchymosis, Hemosiderin Staining, Mottled, Pallor, Rubor, Erythema. Periwound temperature was noted as No Abnormality. The periwound has tenderness on palpation. Wound #5 status is Open. Original cause of wound was Gradually Appeared. The date acquired was: 09/05/2022. The wound has been in treatment 4 weeks. The wound is located on  the Scrotum. The wound measures 4.2cm length x 0.9cm width x 0.1cm depth; 2.969cm^2 area and 0.297cm^3 volume. There is Fat Layer (Subcutaneous Tissue) exposed. There is a medium amount of serosanguineous drainage noted. The wound margin is distinct with the outline attached to the wound base. There is medium (34-66%) red, pink, hyper - granulation within the wound bed. There is a medium (34-66%) amount of necrotic tissue within the wound bed including Adherent Slough. The periwound skin appearance had no abnormalities noted for texture. The periwound skin appearance had no abnormalities noted  for color. The periwound skin appearance did not exhibit: Dry/Scaly, Maceration. Periwound temperature was noted as No Abnormality. The periwound has tenderness on palpation. 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 There was a Selective/Open Wound Non-Viable Tissue Debridement with a total area of 42.36 sq cm performed by Duanne Guess, MD. With the following instrument(s): Curette Material removed includes [facility] after achieving pain control using Lidocaine 4% Topical Solution. No specimens were taken. A time out was conducted at 11:00, prior to the start of the procedure. A Minimum amount of bleeding was controlled with Pressure. The procedure was tolerated well with a pain level of 0 throughout and a pain level of 0 following the procedure. Post Debridement Measurements: 7.6cm length x 7.1cm width x 0.1cm depth; 4.238cm^3 volume. Post debridement Stage noted as Category/Stage III. Character of Wound/Ulcer Post Debridement is improved. Post procedure Diagnosis Wound #3: Same as Pre-Procedure [name] Notes: Scribed for Dr Lady Gary by Brenton Grills RN. Wound #5 Pre-procedure diagnosis of Wound #5 is a Lesion located on the Scrotum . There was a Selective/Open Wound Non-Viable Tissue Debridement with a total area of 2.97 sq cm performed by Duanne Guess, MD. With the following instrument(s): Curette to remove Non-Viable tissue/material. Material removed includes [facility] after achieving pain control using Lidocaine 4% Topical Solution. No specimens were taken. A time out was conducted at 11:00, prior to the start of the procedure.  A Minimum amount of bleeding was controlled with Pressure. The procedure was tolerated well with a pain level of 0 throughout and a pain level of 0 following the procedure. Post Debridement Measurements: 4.2cm length x 0.9cm width x 0.1cm depth; 0.297cm^3 volume. Character of Wound/Ulcer Post Debridement is improved. Post procedure Diagnosis Wound #5: Same as Pre-Procedure [name] Notes: Scribed for Dr Lady Gary by Brenton Grills RN. Plan Follow-up Appointments: Return appointment in 1 month. - *****STRETCHER*******Dr. Lady Gary Rm 3 Anesthetic: Wound #3 Left Gluteus: (In clinic) Topical Lidocaine 4% applied to wound bed Bathing/ Shower/ Hygiene: May shower and wash wound with soap and water. Off-Loading: Wound #3 Left Gluteus: Gel mattress overlay (Group 1) Turn and reposition every 2 hours WOUND #3: - Gluteus Wound Laterality: Left Cleanser: Soap and Water 1 x Per Day/30 Days Discharge Instructions: May shower and wash wound with dial antibacterial soap and water prior to dressing change. Cleanser: Wound Cleanser (Generic) 1 x Per Day/30 Days Discharge Instructions: Cleanse the wound with wound cleanser prior to applying a clean dressing using gauze sponges, not tissue or cotton balls. Peri-Wound Care: Zinc Oxide Ointment 30g tube (Generic) 1 x Per Day/30 Days Discharge Instructions: Apply Zinc Oxide to periwound with each dressing change Topical: Gentamicin 1  x Per Day/30 Days Discharge Instructions: As directed by physician Prim Dressing: Sorbact Superabsorbent Dressing, 8x8 (in/in) (Generic) 1 x Per Day/30 Days ary Discharge Instructions: Needs 2 for the wound Secondary Dressing: ABD Pad, 8x10 (Generic) 1 x Per Day/30 Days Discharge Instructions: Apply over primary dressing as directed. Secured With: 22M Med[employer] Scientist, research (life sciences) Surgical T 2x10 (in/yd) (Generic) 1 x Per Day/30 Days ape Discharge Instructions: Secure with tape as directed. WOUND #5: - Scrotum Wound Laterality: Cleanser:  Soap and Water 1 x Per Day/30 Days Discharge Instructions: May shower and wash wound with dial antibacterial soap and water prior to dressing change. Cleanser: Wound Cleanser (Generic) 1 x Per Day/30 Days Discharge Instructions: Cleanse the wound with wound cleanser prior to applying a clean dressing using gauze sponges, not tissue or cotton balls. Peri-Wound Care: Zinc Oxide Ointment 30g tube (Generic) 1 x Per Day/30 Days Discharge Instructions: Apply Zinc Oxide to periwound with each dressing change Topical: Gentamicin 1 x Per Day/30 Days Discharge Instructions: As directed by physician Prim Dressing: Sorbact Superabsorbent Dressing, 8x8 (in/in) (Generic) 1 x Per Day/30 Days ary Discharge Instructions: Needs 2 for the wound Secondary Dressing: ABD Pad, 8x10 (Generic) 1 x Per Day/30 Days Discharge Instructions: Apply over primary dressing as directed. Secured With: 22M Medipore Scientist, research (life sciences) Surgical T 2x10 (in/yd) (Generic) 1 x Per Day/30 Days ape Discharge Instructions: Secure with tape as directed. 10/03/2022: The wounds all look a little bit more superficial today. They are less friable. He does have a lot of scaly dry skin around each of the wound areas. There is slough on the wound surfaces. I used a curette to debride slough off of all of the wound surfaces and also removed the dry skin around the wounds. Will continue topical gentamicin with Sorbact superabsorbent dressing. Follow-up in 1 month. Electronic Signature(s) Signed: 11/10/2022 2:49:45 PM By: Pearletha Alfred Signed: 11/10/2022 2:55:18 PM By: Duanne Guess MD FACS Previous Signature: 10/03/2022 11:25:34 AM Version By: Duanne Guess MD FACS Entered By: Pearletha Alfred on 11/10/2022 14:49:45 [name] ([id number]) [id number] Page 9 of 11 -------------------------------------------------------------------------------- HxROS Details Patient Name: Date of Service: [name] 10/03/2022 10:30 A  M Medical Record Number: [id number] Patient Account Number: [id number] Date of Birth/Sex: Treating RN: July 31, 1950 (72 y.o. M) Primary Care Provider: Caffie Damme Other Clinician: Referring Provider: Treating Provider/Extender: Haze Rushing in Treatment: 28 Information Obtained From Patient Constitutional Symptoms ([name]) Medical History: Positive for: Congestive Heart Failure Eyes Medical History: Negative for: Cataracts; Glaucoma; Optic Neuritis Ear/Nose/Mouth/Throat Medical History: Negative for: Chronic sinus problems/congestion; Middle ear problems Cardiovascular Medical History: Positive for: Hypertension Past Medical History Notes: atrial fibrillation Endocrine Medical History: Positive for: Type I Diabetes Negative for: Type 1 Diabetes Time with diabetes: less then 10 Treated with: Oral agents Genitourinary Medical History: Negative for: End Stage Renal Disease Musculoskeletal Medical History: Past Medical History Notes: arthritis Neurologic Medical History: Positive for: Neuropathy; Paraplegia Psychiatric Medical History: Negative for: Anorexia/bulimia; Confinement Anxiety Immunizations Pneumococcal Vaccine: Received Pneumococcal Vaccination: Yes Received Pneumococcal Vaccination On or After [address][name], [name] ([id number]) [id number] Page 10 of 11 Implantable Devices None Family and Social History Cancer: Yes - Father,Mother; Diabetes: Yes - Father,Mother,Siblings; Heart Disease: Yes - Mother,Siblings; Hypertension: Yes - Father,Siblings; Kidney Disease: Yes - Siblings; Former smoker - ended on 08/10/2013; Marital Status - Single; Alcohol Use: Rarely; Drug Use: No History; Caffeine Use: Rarely; Financial Concerns: No; Food, Clothing or Shelter Needs: No; Support System Lacking: No; Transportation  Concerns: No Electronic Signature(s) Signed: 10/03/2022 12:25:35 PM By: Duanne Guess  MD FACS Entered By: Duanne Guess on 10/03/2022 11:23:34 -------------------------------------------------------------------------------- SuperBill Details Patient Name: Date of Service: [name] 10/03/2022 Medical Record Number: [id number] Patient Account Number: [id number] Date of Birth/Sex: Treating RN: March 19, 1951 (72 y.o. 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 Facility Procedures : CPT4 Code: [id number] Description: 97597 - DEBRIDE WOUND 1ST 20 SQ CM OR < ICD-10 Diagnosis Description L89.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, stage 3 Modifier: Quantity: 1 : CPT4 Code: [id number] Description: 97598 - DEBRIDE WOUND EA ADDL 20 SQ CM ICD-10 Diagnosis Description L89.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, stage 3 Modifier: Quantity: 2 Physician Procedures : CPT4 Code Description Modifier [id number] 99214 - WC PHYS LEVEL 4 - EST PT 25 ICD-10 Diagnosis Description 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

## 2022-11-11 ENCOUNTER — Encounter (HOSPITAL_BASED_OUTPATIENT_CLINIC_OR_DEPARTMENT_OTHER): Payer: Medicare Other | Attending: General Surgery | Admitting: General Surgery

## 2022-11-11 DIAGNOSIS — E10622 Type 1 diabetes mellitus with other skin ulcer: Secondary | ICD-10-CM | POA: Diagnosis not present

## 2022-11-11 DIAGNOSIS — I110 Hypertensive heart disease with heart failure: Secondary | ICD-10-CM | POA: Insufficient documentation

## 2022-11-11 DIAGNOSIS — Z87891 Personal history of nicotine dependence: Secondary | ICD-10-CM | POA: Diagnosis not present

## 2022-11-11 DIAGNOSIS — L89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, stage 3: Secondary | ICD-10-CM | POA: Diagnosis present

## 2022-11-11 DIAGNOSIS — G6289 Other specified polyneuropathies: Secondary | ICD-10-CM | POA: Insufficient documentation

## 2022-11-11 DIAGNOSIS — I4891 Unspecified atrial fibrillation: Secondary | ICD-10-CM | POA: Diagnosis not present

## 2022-11-11 DIAGNOSIS — Z932 Ileostomy status: Secondary | ICD-10-CM | POA: Diagnosis not present

## 2022-11-11 DIAGNOSIS — G8220 Paraplegia, unspecified: Secondary | ICD-10-CM | POA: Insufficient documentation

## 2022-11-11 DIAGNOSIS — E1042 Type 1 diabetes mellitus with diabetic polyneuropathy: Secondary | ICD-10-CM | POA: Insufficient documentation

## 2022-11-11 DIAGNOSIS — Z933 Colostomy status: Secondary | ICD-10-CM | POA: Insufficient documentation

## 2022-11-11 DIAGNOSIS — I509 Heart failure, unspecified: Secondary | ICD-10-CM | POA: Insufficient documentation

## 2022-11-11 NOTE — Progress Notes (Addendum)
SHRIYAN, STOIBER ([id number]) [id number] Page 1 of 11 Visit Report for 11/11/2022 Chief Complaint Document Details Patient Name: Date of Service: [name] 11/11/2022 8:45 A M Medical Record Number: [id number] Patient Account Number: [id number] Date of Birth/Sex: Treating RN: [birth year] (72 y.o. M) Primary Care Provider: Caffie Damme Other Clinician: Referring Provider: Treating Provider/Extender: Haze Rushing in Treatment: 34 Information Obtained from: Patient Chief Complaint Here for follow up abdominal wound present following laparotomy, colostomy. 03/19/2022: Here for pressure ulcer on left gluteus and ulcer adjacent to ileostomy Electronic Signature(s) Signed: 11/11/2022 9:21:59 AM By: Duanne Guess MD FACS Entered By: Duanne Guess on 11/11/2022 09:21:59 -------------------------------------------------------------------------------- Debridement Details Patient Name: Date of Service: [name]. 11/11/2022 8:45 A M Medical Record Number: [id number] Patient Account Number: [id number] Date of Birth/Sex: Treating RN: 02-01-1951 (72 y.o. [name] Primary Care Provider: Caffie Damme Other Clinician: Referring Provider: Treating Provider/Extender: Haze Rushing in Treatment: 33 Debridement Performed for Assessment: Wound #3 Left Gluteus Performed By: Physician Duanne Guess, MD Debridement Type: Debridement Level of Consciousness (Pre-procedure): Awake and Alert Pre-procedure Verification/Time Out Yes - 09:10 Taken: Start Time: 09:10 Pain Control: Lidocaine 4% T opical Solution Percent of Wound Bed Debrided: 100% T Area Debrided (cm): otal 175.84 Tissue and other material debrided: Non-Viable, Slough, Skin: Dermis , Skin: Epidermis, Slough Level: Skin/Epidermis Debridement Description: Selective/Open Wound Instrument: Curette Bleeding: Minimum Hemostasis Achieved:  Pressure End Time: 09:12 Procedural Pain: 0 Post Procedural Pain: 0 Response to Treatment: Procedure was tolerated well Level of Consciousness (Post- Awake and Alert procedure): Post Debridement Measurements of Total Wound Length: (cm) 14 Stage: Category/Stage III Width: (cm) 16 Depth: (cm) 0.1 Volume: (cm) 17.593 [name], [name] ([id number]) [id number] Page 2 of 11 Character of Wound/Ulcer Post Debridement: Improved Post Procedure Diagnosis Same as Pre-procedure Notes Scribed for Dr. Lady Gary by J.Scotton Electronic Signature(s) Signed: 11/11/2022 10:50:39 AM By: Duanne Guess MD FACS Signed: 11/11/2022 5:05:24 PM By: Karie Schwalbe RN Entered By: Karie Schwalbe on 11/11/2022 09:19:47 -------------------------------------------------------------------------------- Debridement Details Patient Name: Date of Service: [name]. 11/11/2022 8:45 A M Medical Record Number: [id number] Patient Account Number: [id number] Date of Birth/Sex: Treating RN: [birth year] (72 y.o. [name] Primary Care Provider: Caffie Damme Other Clinician: Referring Provider: Treating Provider/Extender: Haze Rushing in Treatment: 33 Debridement Performed for Assessment: Wound #5 Perineum Performed By: Physician Duanne Guess, MD Debridement Type: Debridement Level of Consciousness (Pre-procedure): Awake and Alert Pre-procedure Verification/Time Out Yes - 09:10 Taken: Start Time: 09:10 Pain Control: Lidocaine 4% T opical Solution Percent of Wound Bed Debrided: 100% T Area Debrided (cm): otal 2.36 Tissue and other material debrided: Non-Viable, Slough, Skin: Dermis , Skin: Epidermis, Slough Level: Skin/Epidermis Debridement Description: Selective/Open Wound Instrument: Curette Bleeding: Minimum Hemostasis Achieved: Pressure End Time: 09:12 Procedural Pain: 0 Post Procedural Pain: 0 Response to Treatment: Procedure was tolerated  well Level of Consciousness (Post- Awake and Alert procedure): Post Debridement Measurements of Total Wound Length: (cm) 3 Width: (cm) 1 Depth: (cm) 0.1 Volume: (cm) 0.236 Character of Wound/Ulcer Post Debridement: Improved Post Procedure Diagnosis Same as Pre-procedure Notes Scribed for Dr. Lady Gary by J.Scotton Electronic Signature(s) Signed: 11/11/2022 10:50:39 AM By: Duanne Guess MD FACS Signed: 11/11/2022 5:05:24 PM By: Karie Schwalbe RN Entered By: Karie Schwalbe on 11/11/2022 09:21:26 [name] ([id number]) [id number] Page 3 of 11 -------------------------------------------------------------------------------- HPI Details Patient Name: Date of Service: [name] 11/11/2022 8:45 A M Medical Record Number:  [id number] Patient Account Number: [id number] Date of Birth/Sex: Treating RN: 07/19/1950 (72 y.o. M) Primary Care Provider: Caffie Damme Other Clinician: Referring Provider: Treating Provider/Extender: Haze Rushing in Treatment: 18 History of Present Illness HPI Description: On medihoney with border dressing due to stalling on collagen and concern ABD was pulling off skin. 09/11/14 Nearly healed, counseled given the thin attenuated scar skin I expect will have some recurrence wounds with slight trauma or maceration, however much improved today. cont ABD pads and medihoney. 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 He resides with his sister and is on a regular hospital bed. The 2 of them reported that for about the last year, he has had ulceration adjacent to his ileostomy  as well as on his left gluteus and upper posterior thigh. Most recent hemoglobin A1c was 6.3%. On his left gluteus, there is a stage II pressure ulcer with surrounding tissue maceration and excoriation. There is a little slough on the wound surface. Adjacent to his ileostomy, associated with his appliance, there is an ulcer that extends into the fat layer. It is clean without any slough or necrotic tissue. 04/10/2022: The left gluteal ulcer has expanded and is quite a bit larger today. There is slough and eschar accumulation. The patient's sister reports that the ileostomy associated wound got better and so they did not go to see the outpatient ostomy nurse. They have not received the low-air-loss mattress. 05/01/2022: Apparently the low-air-loss mattress has been denied by his insurance, but they are willing to cover a gel mattress overlay. The ileostomy-area wound has contracted further, per the patient's sister; we are not managing this wound at this time. The left gluteal ulcer seems to be about the same size, but there is extensive slough on all of the open surfaces. His skin is extremely friable. 05/22/2022: The gel mattress overlay was finally delivered last week. The left gluteal ulcer has deteriorated somewhat. There is slough on all of the open surfaces and the total area of the wound is larger. It has a funky odor to it today. 06/13/2022: The gluteal wound is smaller and has substantially less slough. Epithelium is filling in around the margins. The odor that was present at his last visit has dissipated. 07/03/2022: The gluteal wound is a little bit smaller again today. It seems more superficial and just has a light layer of slough on the surface. 08/07/2022: No significant change to the gluteal wound. The patient's sister does report that it has bled less and seems less friable. 09/05/2022: The gluteal wound looks a little bit better, with smaller dimensions and less friable tissue. He has had  some breakdown on his perineum, adjacent to his scrotum. It appears similar in nature to the gluteal wound. 10/03/2022: The wounds all look a little bit more superficial today. They are less friable. He does have a lot of scaly dry skin around each of the wound areas. There is slough on the wound surfaces. 11/11/2022: The wound dimensions are basically unchanged. The perineal ulcer looks a little bit deeper. He has scaly shaggy skin hanging from all of the wound surfaces along with some slough. Electronic Signature(s) Signed: 11/11/2022 9:22:40 AM By: Duanne Guess MD FACS Entered By: Duanne Guess on  11/11/2022 09:22:40 -------------------------------------------------------------------------------- Physical Exam Details Patient Name: Date of Service: [name] 11/11/2022 8:45 A M Medical Record Number: [id number] Patient Account Number: [id number] Date of Birth/Sex: Treating RN: February 18, 1951 (72 y.o. M) Primary Care Provider: Caffie Damme Other Clinician: Referring Provider: Treating Provider/Extender: Murvin Natal Weeks in Treatment: [address], [place] ([id number]) [id number] Page 4 of 11 Hypertensive, asymptomatic. . . . no acute distress. Respiratory Normal work of breathing on room air. Notes 11/11/2022: The wound dimensions are basically unchanged. The perineal ulcer looks a little bit deeper. He has scaly shaggy skin hanging from all of the wound surfaces along with some slough. Electronic Signature(s) Signed: 11/11/2022 9:23:14 AM By: Duanne Guess MD FACS Entered By: Duanne Guess on 11/11/2022 09:23:14 -------------------------------------------------------------------------------- Physician Orders Details Patient Name: Date of Service: [name] 11/11/2022 8:45 A M Medical Record Number: [id number] Patient Account Number: [id number] Date of Birth/Sex: Treating RN: 05/20/1950 (72 y.o. 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 - *****STRETCHER*******Dr. Lady Gary Rm 3 Anesthetic Wound #3 Left Gluteus (In clinic) Topical Lidocaine 4% applied to wound bed Bathing/ Shower/ Hygiene May shower and wash wound with soap and water. Off-Loading Wound #3 Left Gluteus Gel mattress overlay (Group 1) Turn and reposition every 2 hours Wound Treatment Wound #3 - Gluteus Wound Laterality: Left Cleanser: Soap and Water 1 x Per Day/30 Days Discharge Instructions: May shower and wash wound with dial antibacterial soap and water prior to dressing change. Cleanser: Wound Cleanser (Generic) 1 x Per Day/30 Days Discharge Instructions: Cleanse the wound with wound cleanser prior to applying a clean dressing using gauze sponges, not tissue or cotton balls. Peri-Wound Care: Zinc Oxide Ointment 30g tube (Generic) 1 x Per Day/30 Days Discharge Instructions: Apply Zinc Oxide to periwound with each dressing change Topical: Gentamicin (Generic) 1 x Per Day/30 Days Discharge Instructions: As directed by physician Prim Dressing: Sorbact Superabsorbent Dressing, 8x8 (in/in) (DME) (Generic) 1 x Per Day/30 Days ary Discharge Instructions: Needs 2 for the wound [name], [name] ([id number]) [id number] Page 5 of 11 Secondary Dressing: ABD Pad, 8x10 (Generic) 1 x Per Day/30 Days Discharge Instructions: Apply over primary dressing as directed. Secured With: 54M Medipore Scientist, research (life sciences) Surgical T 2x10 (in/yd) (DME)  (Generic) 1 x Per Day/30 Days ape Discharge Instructions: Secure with tape as directed. Wound #5 - Perineum Cleanser: Soap and Water 1 x Per Day/30 Days Discharge Instructions: May shower and wash wound with dial antibacterial soap and water prior to dressing change. Cleanser: Wound Cleanser (DME) (Generic) 1 x Per Day/30 Days Discharge Instructions: Cleanse the wound with wound cleanser prior to applying a clean dressing using gauze sponges, not tissue or cotton balls. Peri-Wound Care: Zinc Oxide Ointment 30g tube (Generic) 1 x Per Day/30 Days Discharge Instructions: Apply Zinc Oxide to periwound with each dressing change Topical: Gentamicin 1 x Per Day/30 Days Discharge Instructions: As directed by physician Prim Dressing: Maxorb Extra Ag+ Alginate Dressing, 4x4.75 (in/in) (DME) (Generic) 1 x Per Day/30 Days ary Discharge Instructions: Apply to wound bed as instructed Prim Dressing: Sorbact Superabsorbent Dressing, 8x8 (in/in) (Generic) 1 x  Per Day/30 Days ary Discharge Instructions: Needs 2 for the wound Secondary Dressing: ABD Pad, 8x10 (DME) (Generic) 1 x Per Day/30 Days Discharge Instructions: Apply over primary dressing as directed. Secured With: 70M Medipore Scientist, research (life sciences) Surgical T 2x10 (in/yd) (DME) (Generic) 1 x Per Day/30 Days ape Discharge Instructions: Secure with tape as directed. Electronic Signature(s) Signed: 11/11/2022 10:50:39 AM By: Duanne Guess MD FACS Signed: 11/11/2022 5:05:24 PM By: Karie Schwalbe RN Entered By: Karie Schwalbe on 11/11/2022 09:34:15 -------------------------------------------------------------------------------- Problem List Details Patient Name: Date of Service: [name]. 11/11/2022 8:45 A M Medical Record Number: [id number] Patient Account Number: [id number] Date of Birth/Sex: Treating RN: 1950/08/25 (72 y.o. M) Primary Care Provider: Caffie Damme Other Clinician: Referring Provider: Treating Provider/Extender: Murvin Natal Weeks in Treatment: 49 Active Problems ICD-10 Encounter Code Description Active Date MDM Diagnosis [phone] Pressure ulcer of left buttock, stage 3 03/19/2022 No Yes E66.01 Morbid (severe) obesity due to excess calories 03/19/2022 No Yes G62.89 Other specified polyneuropathies 03/19/2022 No Yes Z93.2 Ileostomy status 03/19/2022 No Yes ARNIE, BANGHART ([id number]) [id number] Page 6 of 11 E11.622 Type 2 diabetes mellitus with other skin ulcer 03/19/2022 No Yes Inactive Problems ICD-10 Code Description Active Date Inactive Date L98.492 Non-pressure chronic ulcer of skin of other sites with fat layer exposed 03/19/2022 03/19/2022 Resolved Problems Electronic Signature(s) Signed: 11/11/2022 9:07:07 AM By: Duanne Guess MD FACS Entered By: Duanne Guess on 11/11/2022 09:07:07 -------------------------------------------------------------------------------- Progress Note Details Patient Name: Date of Service: [name]. 11/11/2022 8:45 A M Medical Record Number: [id number] Patient Account Number: [id number] Date of Birth/Sex: Treating RN: 29-Jan-1951 (72 y.o. M) Primary Care Provider: Caffie Damme Other Clinician: Referring Provider: Treating Provider/Extender: Haze Rushing in Treatment: 84 Subjective Chief Complaint Information obtained from Patient Here for follow up abdominal wound present following laparotomy, colostomy. 03/19/2022: Here for pressure ulcer on left gluteus and ulcer adjacent to ileostomy History of Present Illness (HPI) On medihoney with border dressing due to stalling on collagen and concern ABD was pulling off skin. 09/11/14 Nearly healed, counseled given the thin attenuated scar skin I expect will have some recurrence wounds with slight trauma or maceration, however much improved today. cont ABD pads and medihoney. 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 He resides with his sister and is on a regular hospital bed. The 2 of them reported that for about the last year, he has had ulceration adjacent to his ileostomy as well as on his left gluteus and upper posterior thigh. Most recent hemoglobin A1c was 6.3%. On his left gluteus, there is a stage II pressure ulcer with surrounding tissue maceration and excoriation. There is a little slough on the wound surface. Adjacent to his ileostomy, associated with his appliance, there is an ulcer that extends into the fat layer. It is clean without any slough or necrotic tissue. 04/10/2022: The left gluteal ulcer has expanded and is quite a bit larger today. There is slough and eschar accumulation. The patient's sister reports that the ileostomy associated wound got better and so they did not go to see the outpatient ostomy nurse. They have not received the low-air-loss mattress. 05/01/2022: Apparently the low-air-loss mattress has been denied by his insurance, but  they are willing to cover a gel mattress overlay. The ileostomy-area wound has contracted further, per the patient's sister; we are not managing this wound at this time. The left gluteal ulcer seems to be about the same size, but there is extensive slough on all of the open surfaces. His skin is extremely friable. 05/22/2022: The gel mattress overlay was finally delivered last week. The left gluteal ulcer has deteriorated somewhat. There is slough on all of the open surfaces and the total area of the wound is larger. It has a funky odor to it today. 06/13/2022: The gluteal wound is smaller and has substantially less slough. Epithelium is filling in around  the margins. The odor that was present at his last visit has dissipated. 07/03/2022: The gluteal wound is a little bit smaller again today. It seems more superficial and just has a light layer of slough on the surface. 08/07/2022: No significant change to the gluteal wound. The patient's sister does report that it has bled less and seems less friable. 09/05/2022: The gluteal wound looks a little bit better, with smaller dimensions and less friable tissue. He has had some breakdown on his perineum, adjacent to his scrotum. It appears similar in nature to the gluteal wound. 10/03/2022: The wounds all look a little bit more superficial today. They are less friable. He does have a lot of scaly dry skin around each of the wound areas. [name], [name] ([id number]) [id number] Page 7 of 11 There is slough on the wound surfaces. 11/11/2022: The wound dimensions are basically unchanged. The perineal ulcer looks a little bit deeper. He has scaly shaggy skin hanging from all of the wound surfaces along with some slough. Patient History Information obtained from Patient. Family History Cancer - Father,Mother, Diabetes - Father,Mother,Siblings, Heart Disease - Mother,Siblings, Hypertension - Father,Siblings, Kidney Disease - Siblings. Social History Former smoker - ended on 08/10/2013, Marital Status - Single, Alcohol Use - Rarely, Drug Use - No History, Caffeine Use - Rarely. Medical History Constitutional Symptoms ([name]) Patient has history of Congestive Heart Failure Eyes Denies history of Cataracts, Glaucoma, Optic Neuritis Ear/Nose/Mouth/Throat Denies history of Chronic sinus problems/congestion, Middle ear problems Cardiovascular Patient has history of Hypertension Endocrine Patient has history of Type I Diabetes Denies history of Type 1 Diabetes Genitourinary Denies history of End Stage Renal Disease Neurologic Patient has history of Neuropathy,  Paraplegia Psychiatric Denies history of Anorexia/bulimia, Confinement Anxiety Medical A Surgical History Notes nd Cardiovascular atrial fibrillation Musculoskeletal arthritis Objective Constitutional Hypertensive, asymptomatic. no acute distress. Vitals Time Taken: 8:45 AM, Height: 77 in, Weight: 318 lbs, BMI: 37.7, Temperature: 97.9 F, Pulse: 80 bpm, Respiratory Rate: 18 breaths/min, Blood Pressure: 150/75 mmHg, Capillary Blood Glucose: 168 mg/dl. Respiratory Normal work of breathing on room air. [name] Notes: 11/11/2022: The wound dimensions are basically unchanged. The perineal ulcer looks a little bit deeper. He has scaly shaggy skin hanging from all of the wound surfaces along with some slough. Integumentary (Hair, Skin) Wound #3 status is Open. Original cause of wound was Gradually Appeared. The date acquired was: 05/14/2021. The wound has been in treatment 33 weeks. The wound is located on the Left Gluteus. The wound measures 14cm length x 16cm width x 0.1cm depth; 175.929cm^2 area and 17.593cm^3 volume. There is Fat Layer (Subcutaneous Tissue) exposed. There is no tunneling or undermining noted. There is a medium amount of serosanguineous drainage noted. The wound margin is distinct with the outline attached to the wound base. There is medium (34-66%) red, friable, hyper -  granulation within the wound bed. There is a medium (34-66%) amount of necrotic tissue within the wound bed including Eschar and Adherent Slough. The periwound skin appearance had no abnormalities noted for color. The periwound skin appearance exhibited: Scarring, Dry/Scaly. The periwound skin appearance did not exhibit: Callus, Crepitus, Excoriation, Induration, Rash, Maceration. Periwound temperature was noted as No Abnormality. The periwound has tenderness on palpation. Wound #5 status is Open. Original cause of wound was Gradually Appeared. The date acquired was: 09/05/2022. The wound has been in treatment 9  weeks. The wound is located on the Perineum. The wound measures 3cm length x 1cm width x 0.1cm depth; 2.356cm^2 area and 0.236cm^3 volume. There is Fat Layer (Subcutaneous Tissue) exposed. There is no tunneling or undermining noted. There is a medium amount of serosanguineous drainage noted. The wound margin is distinct with the outline attached to the wound base. There is medium (34-66%) red, pink, hyper - granulation within the wound bed. There is a medium (34- 66%) amount of necrotic tissue within the wound bed including Adherent Slough. The periwound skin appearance had no abnormalities noted for texture. The periwound skin appearance had no abnormalities noted for color. The periwound skin appearance did not exhibit: Dry/Scaly, Maceration. Periwound temperature was noted as No Abnormality. The periwound has tenderness on palpation. 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 There was a Selective/Open Wound Skin/Epidermis Debridement with a total area of 175.84 sq cm performed by Duanne Guess, MD. With the following instrument(s): Curette to remove Non-Viable tissue/material. Material removed includes Slough, Skin: Dermis, and Skin: Epidermis after achieving pain control using Lidocaine 4% T opical Solution. No specimens were taken. A time out was conducted at 09:10, prior to the start of the procedure. A Minimum amount of bleeding was controlled with Pressure. The procedure was tolerated well with a pain level of 0 throughout and a pain level of 0 following the procedure. Post Debridement Measurements: 14cm length x 16cm width x  0.1cm depth; 17.593cm^3 volume. Post debridement Stage noted as Category/Stage III. Character of Wound/Ulcer Post Debridement is improved. Post procedure Diagnosis Wound #3: Same as Pre-Procedure [name] Notes: Scribed for Dr. Lady Gary by J.Scotton. Wound #5 Pre-procedure diagnosis of Wound #5 is a Lesion located on the Perineum . There was a Selective/Open Wound Skin/Epidermis Debridement with a total area of 2.36 sq cm performed by Duanne Guess, MD. With the following instrument(s): Curette to remove Non-Viable tissue/material. Material removed includes Slough, Skin: Dermis, and Skin: Epidermis after achieving pain control using Lidocaine 4% T opical Solution. No specimens were taken. A time out was conducted at 09:10, prior to the start of the procedure. A Minimum amount of bleeding was controlled with Pressure. The procedure was tolerated well with a pain level of 0 throughout and a pain level of 0 following the procedure. Post Debridement Measurements: 3cm length x 1cm width x 0.1cm depth; 0.236cm^3 volume. Character of Wound/Ulcer Post Debridement is improved. Post procedure Diagnosis Wound #5: Same as Pre-Procedure [name] Notes: Scribed for Dr. Lady Gary by J.Scotton. Plan Follow-up Appointments: Return appointment in 1 month. - *****STRETCHER*******Dr. Lady Gary Rm 3 Anesthetic: Wound #3 Left Gluteus: (In clinic) Topical Lidocaine 4% applied to wound bed Bathing/ Shower/ Hygiene: May shower  and wash wound with soap and water. Off-Loading: Wound #3 Left Gluteus: Gel mattress overlay (Group 1) Turn and reposition every 2 hours WOUND #3: - Gluteus Wound Laterality: Left Cleanser: Soap and Water 1 x Per Day/30 Days Discharge Instructions: May shower and wash wound with dial antibacterial soap and water prior to dressing change. Cleanser: Wound Cleanser (Generic) 1 x Per Day/30 Days Discharge Instructions: Cleanse the wound with wound cleanser prior to applying a clean dressing using  gauze sponges, not tissue or cotton balls. Peri-Wound Care: Zinc Oxide Ointment 30g tube (Generic) 1 x Per Day/30 Days Discharge Instructions: Apply Zinc Oxide to periwound with each dressing change Topical: Gentamicin (Generic) 1 x Per Day/30 Days Discharge Instructions: As directed by physician Prim Dressing: Sorbact Superabsorbent Dressing, 8x8 (in/in) (DME) (Generic) 1 x Per Day/30 Days ary Discharge Instructions: Needs 2 for the wound Secondary Dressing: ABD Pad, 8x10 (Generic) 1 x Per Day/30 Days Discharge Instructions: Apply over primary dressing as directed. Secured With: 2M Medipore Scientist, research (life sciences) Surgical T 2x10 (in/yd) (DME) (Generic) 1 x Per Day/30 Days ape Discharge Instructions: Secure with tape as directed. WOUND #5: - Perineum Wound Laterality: Cleanser: Soap and Water 1 x Per Day/30 Days Discharge Instructions: May shower and wash wound with dial antibacterial soap and water prior to dressing change. Cleanser: Wound Cleanser (DME) (Generic) 1 x Per Day/30 Days Discharge Instructions: Cleanse the wound with wound cleanser prior to applying a clean dressing using gauze sponges, not tissue or cotton balls. Peri-Wound Care: Zinc Oxide Ointment 30g tube (Generic) 1 x Per Day/30 Days Discharge Instructions: Apply Zinc Oxide to periwound with each dressing change Topical: Gentamicin 1 x Per Day/30 Days Discharge Instructions: As directed by physician Prim Dressing: Maxorb Extra Ag+ Alginate Dressing, 4x4.75 (in/in) (DME) (Generic) 1 x Per Day/30 Days ary Discharge Instructions: Apply to wound bed as instructed Prim Dressing: Sorbact Superabsorbent Dressing, 8x8 (in/in) (Generic) 1 x Per Day/30 Days ary Discharge Instructions: Needs 2 for the wound Secondary Dressing: ABD Pad, 8x10 (DME) (Generic) 1 x Per Day/30 Days Discharge Instructions: Apply over primary dressing as directed. Secured With: 2M Med[employer] Scientist, research (life sciences) Surgical T 2x10 (in/yd) (DME) (Generic) 1 x Per Day/30  Days ape Discharge Instructions: Secure with tape as directed. 11/11/2022: The wound dimensions are basically unchanged. The perineal ulcer looks a little bit deeper. He has scaly shaggy skin hanging from all of the wound surfaces along with some slough. [name], [name] ([id number]) [id number] Page 9 of 11 I used a curette to debride the shaggy dry skin and slough from all of his wounds. I think the perineum may have gotten a little bit deeper secondary to moisture so I am going to change the contact layer there to silver alginate. Will continue the topical gentamicin and Sorbact to the other surfaces. Follow-up in 1 month. Electronic Signature(s) Signed: 11/11/2022 5:27:02 PM By: Bruce Stall RN, BSN Signed: 11/12/2022 7:55:03 AM By: Duanne Guess MD FACS Previous Signature: 11/11/2022 9:27:29 AM Version By: Duanne Guess MD FACS Entered By: [name] on 11/11/2022 17:25:54 -------------------------------------------------------------------------------- HxROS Details Patient Name: Date of Service: [name]. 11/11/2022 8:45 A M Medical Record Number: [id number] Patient Account Number: [id number] Date of Birth/Sex: Treating RN: May 29, 1950 (72 y.o. M) Primary Care Provider: Caffie Damme Other Clinician: Referring Provider: Treating Provider/Extender: Haze Rushing in Treatment: 41 Information Obtained From Patient Constitutional Symptoms ([name]) Medical History: Positive for: Congestive Heart Failure Eyes Medical History: Negative for: Cataracts; Glaucoma; Optic Neuritis Ear/Nose/Mouth/Throat  Medical History: Negative for: Chronic sinus problems/congestion; Middle ear problems Cardiovascular Medical History: Positive for: Hypertension Past Medical History Notes: atrial fibrillation Endocrine Medical History: Positive for: Type I Diabetes Negative for: Type 1 Diabetes Time with diabetes: less then  10 Treated with: Oral agents Genitourinary Medical History: Negative for: End Stage Renal Disease Musculoskeletal Medical History: Past Medical History Notes: arthritis Neurologic Medical History: Positive for: Neuropathy; Paraplegia [name], [name] ([id number]) [id number] Page 10 of 11 Psychiatric Medical History: Negative for: Anorexia/bulimia; Confinement Anxiety Immunizations Pneumococcal Vaccine: Received Pneumococcal Vaccination: Yes Received Pneumococcal Vaccination On or After 60th Birthday: Yes Implantable Devices None Family and Social History Cancer: Yes - Father,Mother; Diabetes: Yes - Father,Mother,Siblings; Heart Disease: Yes - Mother,Siblings; Hypertension: Yes - Father,Siblings; Kidney Disease: Yes - Siblings; Former smoker - ended on 08/10/2013; Marital Status - Single; Alcohol Use: Rarely; Drug Use: No History; Caffeine Use: Rarely; Financial Concerns: No; Food, Clothing or Shelter Needs: No; Support System Lacking: No; Transportation Concerns: No Electronic Signature(s) Signed: 11/11/2022 10:50:39 AM By: Duanne Guess MD FACS Entered By: Duanne Guess on 11/11/2022 09:22:50 -------------------------------------------------------------------------------- SuperBill Details Patient Name: Date of Service: [name] 11/11/2022 Medical Record Number: [id number] Patient Account Number: [id number] Date of Birth/Sex: Treating RN: January 29, 1951 (72 y.o. 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 Facility Procedures : CPT4 Code: [id number] Description:  97597 - DEBRIDE WOUND 1ST 20 SQ CM OR < ICD-10 Diagnosis Description L89.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, stage 3 Modifier: Quantity: 1 : CPT4 Code: [id number] Description: 97598 - DEBRIDE WOUND EA ADDL 20 SQ CM ICD-10 Diagnosis Description L89.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, stage 3 Modifier: Quantity: 8 Physician Procedures : CPT4 Code Description Modifier [id number] 99214 - WC PHYS LEVEL 4 - EST PT 25 ICD-10 Diagnosis Description L89.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, 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

## 2022-11-26 NOTE — Progress Notes (Signed)
[name] ([id number]) [id number] Page 1 of 8 Visit Report for 11/11/2022 Arrival Information Details Patient Name: Date of Service: [name] 11/11/2022 8:45 A M Medical Record Number: [id number] Patient Account Number: [id number] Date of Birth/Sex: Treating RN: [birth year] (72 y.o. [name] Primary Care [name]: [name] Other Clinician: Referring [name]: Treating [name]/Extender: [name] in Treatment: 72 Visit Information History Since Last Visit All ordered tests and consults were completed: Yes Patient Arrived: Stretcher Added or deleted any medications: No Arrival Time: 08:45 Any new allergies or adverse reactions: No Accompanied By: spouse Had a fall or experienced change in No Transfer Assistance: None activities of daily living that may affect Patient Identification Verified: Yes risk of falls: Secondary Verification Process Completed: Yes Signs or symptoms of abuse/neglect since last visito No Patient Requires Transmission-Based Precautions: No Hospitalized since last visit: No Patient Has Alerts: No Implantable device outside of the clinic excluding No cellular tissue based products placed in the center since last visit: Has Dressing in Place as Prescribed: Yes Pain Present Now: No Electronic Signature(s) Signed: 11/26/2022 7:59:22 AM By: [name] Entered By: [name] on 11/11/2022 08:45:57 -------------------------------------------------------------------------------- Encounter Discharge Information Details Patient Name: Date of Service: [name]. 11/11/2022 8:45 A M Medical Record Number: [id number] Patient Account Number: [id number] Date of Birth/Sex: Treating RN: [birth year] (72 y.o. [name] Primary Care [name]: [name] Other Clinician: Referring [name]: Treating [name]/Extender: [name] in Treatment:  46 Encounter Discharge Information Items Post Procedure Vitals Discharge Condition: Stable Temperature (F): 97.9 Ambulatory Status: Wheelchair Pulse (bpm): 80 Discharge Destination: Home Respiratory Rate (breaths/min): 18 Transportation: Private Auto Blood Pressure (mmHg): 150/75 Accompanied By: [name] soeur Schedule Follow-up Appointment: Yes Clinical Summary of Care: Patient Declined Electronic Signature(s) Signed: 11/11/2022 5:05:24 PM By: Bruce Schwalbe RN Entered By: [name] on 11/11/2022 17:04:14 [name] ([id number]) [id number] Page 2 of 8 -------------------------------------------------------------------------------- Lower Extremity Assessment Details Patient Name: Date of Service: [name] 11/11/2022 8:45 A M Medical Record Number: [id number] Patient Account Number: [id number] Date of Birth/Sex: Treating RN: [birth year] (72 y.o. [name] Primary Care [name]: [name] Other Clinician: Referring [name]: Treating [name]/Extender: [name] in Treatment: 33 Electronic Signature(s) Signed: 11/26/2022 7:59:22 AM By: [name] Entered By: [name] on 11/11/2022 08:48:45 -------------------------------------------------------------------------------- Multi Wound Chart Details Patient Name: Date of Service: [name] 11/11/2022 8:45 A M Medical Record Number: [id number] Patient Account Number: [id number] Date of Birth/Sex: Treating RN: Mar 05, 1951 (72 y.o. M) Primary Care Telsa Dillavou: [name] Other Clinician: Referring [name]: Treating [name]/Extender: [name] in Treatment: 33 Vital Signs Height(in): 77 Capillary Blood Glucose(mg/dl): 347 Weight(lbs): 425 Pulse(bpm): 80 Body Mass Index(BMI): 37.7 Blood Pressure(mmHg): 150/75 Temperature(F): 97.9 Respiratory Rate(breaths/min): 18 [3:Photos:] [N/A:N/A] Left Gluteus Scrotum  N/A Wound Location: Gradually Appeared Gradually Appeared N/A Wounding Event: Pressure Ulcer Lesion N/A Primary Etiology: Congestive Heart Failure, Congestive Heart Failure, N/A Comorbid History: Hypertension, Type I Diabetes, Hypertension, Type I Diabetes, Neuropathy, Paraplegia Neuropathy, Paraplegia 05/14/2021 09/05/2022 N/A Date Acquired: 33 9 N/A [name] of Treatment: Open Open N/A Wound Status: No No N/A Wound Recurrence: 14x16x0.1 0.1x0.1x0.1 N/A Measurements L x W x D (cm) 175.929 0.008 N/A A (cm) : rea 17.593 0.001 N/A Volume (cm) : -5.40% 99.40% N/A % Reduction in Area: -5.40% 99.20% N/A % Reduction in Volume: Category/Stage III Full Thickness Without Exposed N/A Classification: Support Structures Medium Medium N/A  Exudate Amount: Serosanguineous Serosanguineous N/A Exudate Type: red, brown red, brown N/A Exudate Color: Distinct, outline attached Distinct, outline attached N/A Wound Margin: Medium (34-66%) Medium (34-66%) N/A Granulation Amount: [name] ([id number]) [id number] Page 3 of 8 Red, Hyper-granulation, Friable Red, Pink, Hyper-granulation N/A Granulation Quality: Medium (34-66%) Medium (34-66%) N/A Necrotic Amount: Eschar, Adherent Slough Adherent Slough N/A Necrotic Tissue: Fat Layer (Subcutaneous Tissue): Yes Fat Layer (Subcutaneous Tissue): Yes N/A Exposed Structures: Fascia: No Fascia: No Tendon: No Tendon: No Muscle: No Muscle: No Joint: No Joint: No Bone: No Bone: No Small (1-33%) None N/A Epithelialization: Scarring: Yes No Abnormalities Noted N/A Periwound Skin Texture: Excoriation: No Induration: No Callus: No Crepitus: No Rash: No Dry/Scaly: Yes Maceration: No N/A Periwound Skin Moisture: Maceration: No Dry/Scaly: No Atrophie Blanche: No No Abnormalities Noted N/A Periwound Skin Color: Cyanosis: No Ecchymosis: No Erythema: No Hemosiderin Staining: No Mottled: No Pallor:  No Rubor: No No Abnormality No Abnormality N/A Temperature: Yes Yes N/A Tenderness on Palpation: Treatment Notes Electronic Signature(s) Signed: 11/11/2022 9:07:29 AM By: Duanne Guess MD [name] Entered By: Duanne [name] on 11/11/2022 09:07:28 -------------------------------------------------------------------------------- Multi-Disciplinary Care Plan Details Patient Name: Date of Service: [name]. 11/11/2022 8:45 A M Medical Record Number: [id number] Patient Account Number: [id number] Date of Birth/Sex: Treating RN: Apr 03, 1951 (72 y.o. [name] Primary Care Rita Vialpando: [name] Other Clinician: Referring Isreal Moline: Treating Nivea Wojdyla/Extender: [name] in Treatment: 33 Multidisciplinary Care Plan reviewed with physician Active Inactive Nutrition Nursing Diagnoses: Potential for alteratiion in Nutrition/Potential for imbalanced nutrition Goals: Patient/caregiver verbalizes understanding of need to maintain therapeutic glucose control per primary care physician Date Initiated: 03/19/2022 Target Resolution Date: 03/10/2023 Goal Status: Active Interventions: Provide education on elevated blood sugars and impact on wound healing Treatment Activities: Dietary management education, guidance and counseling : 03/19/2022 Notes: Wound/Skin Impairment Nursing Diagnoses: Knowledge deficit related to ulceration/compromised skin integrity QAMAR, AUGHENBAUGH ([id number]) [id number] Page 4 of 8 Goals: Ulcer/skin breakdown will have a volume reduction of 30% by week 4 Date Initiated: 03/19/2022 Target Resolution Date: 03/10/2023 Goal Status: Active Interventions: Assess ulceration(s) every visit Provide education on ulcer and skin care Treatment Activities: Skin care regimen initiated : 03/19/2022 Notes: Electronic Signature(s) Signed: 11/11/2022 5:05:24 PM By: Bruce Schwalbe RN Entered By: [name] on  11/11/2022 17:02:37 -------------------------------------------------------------------------------- Pain Assessment Details Patient Name: Date of Service: [name]. 11/11/2022 8:45 A M Medical Record Number: [id number] Patient Account Number: [id number] Date of Birth/Sex: Treating RN: 11-Jun-1950 (72 y.o. [name] Primary Care Erendida Wrenn: [name] Other Clinician: Referring Cortavious Nix: Treating [place] Kappes/Extender: [name] in Treatment: 33 Active Problems Location of Pain Severity and Description of Pain Patient Has Paino Yes Site Locations Rate the pain. Current Pain Level: 7 Pain Management and Medication Current Pain Management: Notes "I'm always in pain" Electronic Signature(s) Signed: 11/26/2022 7:59:22 AM By: [name] Entered By: [name] on 11/11/2022 08:48:32 [name] ([id number]) [id number] Page 5 of 8 -------------------------------------------------------------------------------- Patient/Caregiver Education Details Patient Name: Date of Service: [name] 7/2/2024andnbsp8:45 A M Medical Record Number: [id number] Patient Account Number: [id number] Date of Birth/Gender: Treating RN: [birth year] (72 y.o. [name] Primary Care Physician: [name] Other Clinician: Referring Physician: Treating Physician/Extender: [name] in Treatment: 21 Education Assessment Education Provided To: Patient Education Topics Provided Wound/Skin Impairment: Methods: Explain/Verbal Responses: Return demonstration correctly Electronic Signature(s) Signed: 11/11/2022 5:05:24 PM By: Bruce Schwalbe RN Entered By: [name] on 11/11/2022  17:02:52 -------------------------------------------------------------------------------- Wound Assessment Details Patient Name: Date of Service: [name] 11/11/2022 8:45 A M Medical Record Number:  [id number] Patient Account Number: [id number] Date of Birth/Sex: Treating RN: 16-Nov-1950 (72 y.o. [name] Primary Care Pate Aylward: [name] Other Clinician: Referring Lakasha Mcfall: Treating Gorje Iyer/Extender: [name] in Treatment: 33 Wound Status Wound Number: 3 Primary Pressure Ulcer Etiology: Wound Location: Left Gluteus Wound Open Wounding Event: Gradually Appeared Status: Date Acquired: 05/14/2021 Comorbid Congestive Heart Failure, Hypertension, Type I Diabetes, [name] Of Treatment: 33 History: Neuropathy, Paraplegia Clustered Wound: No Photos Wound Measurements Length: (cm) 14 Width: (cm) 16 Talaga, Syed S ([id number]) Depth: (cm) 0.1 Area: (cm) 175.92 Volume: (cm) 17.593 % Reduction in Area: -5.4% % Reduction in Volume: -5.4% [id number] Page 6 of 8 Epithelialization: Small (1-33%) 9 Tunneling: No Undermining: No Wound Description Classification: Category/Stage III Wound Margin: Distinct, outline attached Exudate Amount: Medium Exudate Type: Serosanguineous Exudate Color: red, brown Foul Odor After Cleansing: No Slough/Fibrino Yes Wound Bed Granulation Amount: Medium (34-66%) Exposed Structure Granulation Quality: Red, Hyper-granulation, Friable Fascia Exposed: No Necrotic Amount: Medium (34-66%) Fat Layer (Subcutaneous Tissue) Exposed: Yes Necrotic Quality: Eschar, Adherent Slough Tendon Exposed: No Muscle Exposed: No Joint Exposed: No Bone Exposed: No Periwound Skin Texture Texture Color No Abnormalities Noted: No No Abnormalities Noted: Yes Callus: No Temperature / Pain Crepitus: No Temperature: No Abnormality Excoriation: No Tenderness on Palpation: Yes Induration: No Rash: No Scarring: Yes Moisture No Abnormalities Noted: No Dry / Scaly: Yes Maceration: No Treatment Notes Wound #3 (Gluteus) Wound Laterality: Left Cleanser Soap and Water Discharge Instruction: May shower and  wash wound with dial antibacterial soap and water prior to dressing change. Wound Cleanser Discharge Instruction: Cleanse the wound with wound cleanser prior to applying a clean dressing using gauze sponges, not tissue or cotton balls. Peri-Wound Care Zinc Oxide Ointment 30g tube Discharge Instruction: Apply Zinc Oxide to periwound with each dressing change Topical Gentamicin Discharge Instruction: As directed by physician Primary Dressing Sorbact Superabsorbent Dressing, 8x8 (in/in) Discharge Instruction: Needs 2 for the wound Secondary Dressing ABD Pad, 8x10 Discharge Instruction: Apply over primary dressing as directed. Secured With [employer] T 2x10 (in/yd) ape Discharge Instruction: Secure with tape as directed. Compression Wrap Compression Stockings Add-Ons Electronic Signature(s) Signed: 11/26/2022 7:59:22 AM By: [name] Entered By: [name] on 11/11/2022 09:04:56 [name] ([id number]) [id number] Page 7 of 8 -------------------------------------------------------------------------------- Wound Assessment Details Patient Name: Date of Service: [name] 11/11/2022 8:45 A M Medical Record Number: [id number] Patient Account Number: [id number] Date of Birth/Sex: Treating RN: 1950/09/25 (72 y.o. [name] Primary Care Yevette Knust: [name] Other Clinician: Referring Bradi Arbuthnot: Treating Nickalaus Crooke/Extender: [name] in Treatment: 33 Wound Status Wound Number: 5 Primary Lesion Etiology: Wound Location: Perineum Wound Open Wounding Event: Gradually Appeared Status: Date Acquired: 09/05/2022 Comorbid Congestive Heart Failure, Hypertension, Type I Diabetes, [name] Of Treatment: 9 History: Neuropathy, Paraplegia Clustered Wound: No Photos Wound Measurements Length: (cm) 3 Width: (cm) 1 Depth: (cm) 0.1 Area: (cm) 2.356 Volume: (cm) 0.236 % Reduction in Area:  -78.6% % Reduction in Volume: -78.8% Epithelialization: None Tunneling: No Undermining: No Wound Description Classification: Full Thickness Without Exposed Suppor Wound Margin: Distinct, outline attached Exudate Amount: Medium Exudate Type: Serosanguineous Exudate Color: red, brown t Structures Foul Odor After Cleansing: No Slough/Fibrino No Wound Bed Granulation Amount: Medium (34-66%) Exposed Structure Granulation Quality: Red, Pink, Hyper-granulation Fascia Exposed: No Necrotic Amount: Medium (34-66%) Fat Layer (Subcutaneous  Tissue) Exposed: Yes Necrotic Quality: Adherent Slough Tendon Exposed: No Muscle Exposed: No Joint Exposed: No Bone Exposed: No Periwound Skin Texture Texture Color No Abnormalities Noted: Yes No Abnormalities Noted: Yes Moisture Temperature / Pain No Abnormalities Noted: No Temperature: No Abnormality Dry / Scaly: No Tenderness on Palpation: Yes Maceration: No Treatment Notes Wound #5 Ples Specter REYMUNDO, WINSHIP ([id number]) [id number] Page 8 of 8 Cleanser Soap and Water Discharge Instruction: May shower and wash wound with dial antibacterial soap and water prior to dressing change. Wound Cleanser Discharge Instruction: Cleanse the wound with wound cleanser prior to applying a clean dressing using gauze sponges, not tissue or cotton balls. Peri-Wound Care Zinc Oxide Ointment 30g tube Discharge Instruction: Apply Zinc Oxide to periwound with each dressing change Topical Gentamicin Discharge Instruction: As directed by physician Primary Dressing Maxorb Extra Ag+ Alginate Dressing, 4x4.75 (in/in) Discharge Instruction: Apply to wound bed as instructed Sorbact Superabsorbent Dressing, 8x8 (in/in) Discharge Instruction: Needs 2 for the wound Secondary Dressing ABD Pad, 8x10 Discharge Instruction: Apply over primary dressing as directed. Secured With [employer] T 2x10 (in/yd) ape Discharge  Instruction: Secure with tape as directed. Compression Wrap Compression Stockings Add-Ons Electronic Signature(s) Signed: 11/11/2022 5:05:24 PM By: Bruce Schwalbe RN Signed: 11/26/2022 7:59:22 AM By: [name] Entered By: [name] on 11/11/2022 09:16:34 -------------------------------------------------------------------------------- Vitals Details Patient Name: Date of Service: [name]. 11/11/2022 8:45 A M Medical Record Number: [id number] Patient Account Number: [id number] Date of Birth/Sex: Treating RN: 31-Dec-1950 (72 y.o. [name] Primary Care Joeline Freer: [name] Other Clinician: Referring Ismael Karge: Treating Chesley Valls/Extender: [name] in Treatment: 33 Vital Signs Time Taken: 08:45 Temperature (F): 97.9 Height (in): 77 Pulse (bpm): 80 Weight (lbs): 318 Respiratory Rate (breaths/min): 18 Body Mass Index (BMI): 37.7 Blood Pressure (mmHg): 150/75 Capillary Blood Glucose (mg/dl): 086 Reference Range: 80 - 120 mg / dl Electronic Signature(s) Signed: 11/26/2022 7:59:22 AM By: [name] Entered By: [name] on 11/11/2022 08:46:45

## 2022-12-02 ENCOUNTER — Other Ambulatory Visit: Payer: Self-pay | Admitting: Neurology

## 2022-12-02 DIAGNOSIS — G40A01 Absence epileptic syndrome, not intractable, with status epilepticus: Secondary | ICD-10-CM

## 2022-12-02 DIAGNOSIS — R198 Other specified symptoms and signs involving the digestive system and abdomen: Secondary | ICD-10-CM

## 2022-12-02 DIAGNOSIS — Z7901 Long term (current) use of anticoagulants: Secondary | ICD-10-CM

## 2022-12-02 DIAGNOSIS — G6289 Other specified polyneuropathies: Secondary | ICD-10-CM

## 2022-12-02 DIAGNOSIS — Z86718 Personal history of other venous thrombosis and embolism: Secondary | ICD-10-CM

## 2022-12-11 ENCOUNTER — Encounter (HOSPITAL_BASED_OUTPATIENT_CLINIC_OR_DEPARTMENT_OTHER): Payer: Medicare Other | Attending: General Surgery | Admitting: General Surgery

## 2022-12-11 DIAGNOSIS — I509 Heart failure, unspecified: Secondary | ICD-10-CM | POA: Diagnosis not present

## 2022-12-11 DIAGNOSIS — E11622 Type 2 diabetes mellitus with other skin ulcer: Secondary | ICD-10-CM | POA: Insufficient documentation

## 2022-12-11 DIAGNOSIS — E1140 Type 2 diabetes mellitus with diabetic neuropathy, unspecified: Secondary | ICD-10-CM | POA: Diagnosis not present

## 2022-12-11 DIAGNOSIS — Z833 Family history of diabetes mellitus: Secondary | ICD-10-CM | POA: Insufficient documentation

## 2022-12-11 DIAGNOSIS — Z87891 Personal history of nicotine dependence: Secondary | ICD-10-CM | POA: Diagnosis not present

## 2022-12-11 DIAGNOSIS — I4891 Unspecified atrial fibrillation: Secondary | ICD-10-CM | POA: Diagnosis not present

## 2022-12-11 DIAGNOSIS — Z932 Ileostomy status: Secondary | ICD-10-CM | POA: Diagnosis not present

## 2022-12-11 DIAGNOSIS — I110 Hypertensive heart disease with heart failure: Secondary | ICD-10-CM | POA: Insufficient documentation

## 2022-12-11 DIAGNOSIS — G8220 Paraplegia, unspecified: Secondary | ICD-10-CM | POA: Diagnosis not present

## 2022-12-11 DIAGNOSIS — L89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, stage 3: Secondary | ICD-10-CM | POA: Insufficient documentation

## 2022-12-11 NOTE — Progress Notes (Addendum)
[name] ([id number]) [id number] Page 1 of 10 Visit Report for 12/11/2022 Chief Complaint Document Details Patient Name: Date of Service: [name] 12/11/2022 11:00 A M Medical Record Number: [id number] Patient Account Number: [id number] Date of Birth/Sex: Treating RN: 1950/11/15 (72 y.o. M) Primary Care Provider: Caffie Damme Other Clinician: Referring Provider: Treating Provider/Extender: Haze Rushing in Treatment: 38 Information Obtained from: Patient Chief Complaint Here for follow up abdominal wound present following laparotomy, colostomy. 03/19/2022: Here for pressure ulcer on left gluteus and ulcer adjacent to ileostomy Electronic Signature(s) Signed: 12/11/2022 11:22:06 AM By: Duanne Guess MD FACS Entered By: Duanne Guess on 12/11/2022 11:22:06 -------------------------------------------------------------------------------- Debridement Details Patient Name: Date of Service: [name]. 12/11/2022 11:00 A M Medical Record Number: [id number] Patient Account Number: [id number] Date of Birth/Sex: Treating RN: 1950-10-06 (72 y.o. [name] Primary Care Provider: Caffie Damme Other Clinician: Referring Provider: Treating Provider/Extender: Haze Rushing in Treatment: 38 Debridement Performed for Assessment: Wound #3 Left Gluteus Performed By: Physician Duanne Guess, MD Debridement Type: Debridement Level of Consciousness (Pre-procedure): Awake and Alert Pre-procedure Verification/Time Out Yes - 11:10 Taken: Start Time: 11:13 Pain Control: Lidocaine 4% T opical Solution Percent of Wound Bed Debrided: 100% T Area Debrided (cm): otal 117.75 Tissue and other material debrided: Non-Viable, Slough, Slough Level: Non-Viable Tissue Debridement Description: Selective/Open Wound Instrument: Curette Bleeding: Minimum Hemostasis Achieved: Pressure Response to Treatment:  Procedure was tolerated well Level of Consciousness (Post- Awake and Alert procedure): Post Debridement Measurements of Total Wound Length: (cm) 10 Stage: Category/Stage III Width: (cm) 15 Depth: (cm) 0.1 Volume: (cm) 11.781 Character of Wound/Ulcer Post Debridement: Improved Post Procedure Diagnosis [name] ([id number]) [id number] Page 2 of 10 Same as Pre-procedure Notes Scribed for Dr [name] by Bruce Pulling, RN Electronic Signature(s) Signed: 12/11/2022 12:27:09 PM By: Duanne Guess MD FACS Signed: 12/11/2022 3:40:07 PM By: Bruce Pulling RN, BSN Entered By: [name] on 12/11/2022 11:14:49 -------------------------------------------------------------------------------- HPI Details Patient Name: Date of Service: [name]. 12/11/2022 11:00 A M Medical Record Number: [id number] Patient Account Number: [id number] Date of Birth/Sex: Treating RN: 10-02-1950 (72 y.o. M) Primary Care Provider: Caffie Damme Other Clinician: Referring Provider: Treating Provider/Extender: Haze Rushing in Treatment: 27 History of Present Illness HPI Description: On medihoney with border dressing due to stalling on collagen and concern ABD was [name] off skin. 09/11/14 Nearly healed, counseled given the thin attenuated scar skin I expect will have some recurrence wounds with slight trauma or maceration, however much improved today. cont ABD pads and medihoney. 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 He resides with his sister and is on a regular hospital bed. The 2 of them reported that for about  the last year, he has had ulceration adjacent to his ileostomy as well as on his left gluteus and upper posterior thigh. Most recent hemoglobin A1c was 6.3%. On his left gluteus, there is a stage II pressure ulcer with surrounding tissue maceration and excoriation. There is a little slough on the wound surface. Adjacent to his ileostomy, associated with his appliance, there is an ulcer that extends into the fat layer. It is clean without any slough or necrotic tissue. 04/10/2022:  The left gluteal ulcer has expanded and is quite a bit larger today. There is slough and eschar accumulation. The patient's sister reports that the ileostomy associated wound got better and so they did not go to see the outpatient ostomy nurse. They have not received the low-air-loss mattress. 05/01/2022: Apparently the low-air-loss mattress has been denied by his insurance, but they are willing to cover a gel mattress overlay. The ileostomy-area wound has contracted further, per the patient's sister; we are not managing this wound at this time. The left gluteal ulcer seems to be about the same size, but there is extensive slough on all of the open surfaces. His skin is extremely friable. 05/22/2022: The gel mattress overlay was finally delivered last week. The left gluteal ulcer has deteriorated somewhat. There is slough on all of the open surfaces and the total area of the wound is larger. It has a funky odor to it today. 06/13/2022: The gluteal wound is smaller and has substantially less slough. Epithelium is filling in around the margins. The odor that was present at his last visit has dissipated. 07/03/2022: The gluteal wound is a little bit smaller again today. It seems more superficial and just has a light layer of slough on the surface. 08/07/2022: No significant change to the gluteal wound. The patient's sister does report that it has bled less and seems less friable. 09/05/2022: The gluteal wound looks a little bit  better, with smaller dimensions and less friable tissue. He has had some breakdown on his perineum, adjacent to his scrotum. It appears similar in nature to the gluteal wound. 10/03/2022: The wounds all look a little bit more superficial today. They are less friable. He does have a lot of scaly dry skin around each of the wound areas. There is slough on the wound surfaces. 11/11/2022: The wound dimensions are basically unchanged. The perineal ulcer looks a little bit deeper. He has scaly shaggy skin hanging from all of the wound surfaces along with some slough. 12/11/2022: All of the open areas have converged and appear deeper today. There is slough on the surfaces along with some hypertrophic granulation tissue. Electronic Signature(s) Signed: 12/11/2022 11:22:52 AM By: Duanne Guess MD FACS Entered By: Duanne Guess on 12/11/2022 11:22:52 [name] ([id number]) [id number] Page 3 of 10 -------------------------------------------------------------------------------- Physical Exam Details Patient Name: Date of Service: [name] 12/11/2022 11:00 A M Medical Record Number: [id number] Patient Account Number: [id number] Date of Birth/Sex: Treating RN: [birth year] (72 y.o. M) Primary Care Provider: Caffie Damme Other Clinician: Referring Provider: Treating Provider/Extender: Murvin Natal Weeks in Treatment: 38 Constitutional . . . . no acute distress. Respiratory Normal work of breathing on room air. Notes 12/11/2022: All of the open areas have converged and appear deeper today. There is slough on the surfaces along with some hypertrophic granulation tissue. Electronic Signature(s) Signed: 12/11/2022 11:23:32 AM By: Duanne Guess MD FACS Entered By: Duanne Guess on 12/11/2022 11:23:31 -------------------------------------------------------------------------------- Physician Orders Details Patient Name: Date of Service: [name]. 12/11/2022 11:00 A M Medical Record Number: [id number] Patient Account Number: [id number] Date of Birth/Sex: Treating RN: 1951/02/02 (72 y.o. [name] Primary Care Provider: Caffie Damme Other Clinician: Referring Provider: Treating Provider/Extender: Haze Rushing in Treatment: 39 Verbal / Phone Orders: No Diagnosis Coding ICD-10 Coding Code Description [phone] Pressure ulcer of left buttock, stage 3 E66.01 Morbid (severe) obesity due to excess calories G62.89 Other specified polyneuropathies Z93.2 Ileostomy status E11.622 Type 2  diabetes mellitus with other skin ulcer Follow-up Appointments Return appointment in 1 month. - *****STRETCHER*******Dr. Lady [name] Rm 3 Anesthetic Wound #3 Left Gluteus (In clinic) Topical Lidocaine 4% applied to wound bed Bathing/ Shower/ Hygiene May shower and wash wound with soap and water. Off-Loading Wound #3 Left Gluteus Gel mattress overlay (Group 1) Turn and reposition every 2 hours Wound Treatment TALION, LOWRIE ([id number]) [id number] Page 4 of 10 Wound #3 - Gluteus Wound Laterality: Left Cleanser: Soap and Water 1 x Per Day/30 Days Discharge Instructions: May shower and wash wound with dial antibacterial soap and water prior to dressing change. Cleanser: Wound Cleanser (Generic) 1 x Per Day/30 Days Discharge Instructions: Cleanse the wound with wound cleanser prior to applying a clean dressing using gauze sponges, not tissue or cotton balls. Peri-Wound Care: Zinc Oxide Ointment 30g tube (Generic) 1 x Per Day/30 Days Discharge Instructions: Apply Zinc Oxide to periwound with each dressing change Topical: Gentamicin (Generic) 1 x Per Day/30 Days Discharge Instructions: As directed by physician Prim Dressing: Maxorb Extra Ag+ Alginate Dressing, 4x4.75 (in/in) 1 x Per Day/30 Days ary Discharge Instructions: Apply to wound bed as instructed Secondary Dressing: ABD Pad, 8x10  (Generic) 1 x Per Day/30 Days Discharge Instructions: Apply over primary dressing as directed. Secondary Dressing: Zetuvit Plus 4x8 in 1 x Per Day/30 Days Discharge Instructions: Apply over primary dressing as directed. Secured With: 64M Medipore Scientist, research (life sciences) Surgical T 2x10 (in/yd) (Generic) 1 x Per Day/30 Days ape Discharge Instructions: Secure with tape as directed. Patient Medications llergies: penicillin A Notifications Medication Indication Start End 12/11/2022 lidocaine DOSE topical 4 % cream - cream topical once daily Electronic Signature(s) Signed: 12/11/2022 12:27:09 PM By: Duanne Guess MD FACS Entered By: Duanne Guess on 12/11/2022 11:23:47 -------------------------------------------------------------------------------- Problem List Details Patient Name: Date of Service: [name]. 12/11/2022 11:00 A M Medical Record Number: [id number] Patient Account Number: [id number] Date of Birth/Sex: Treating RN: 1950-10-13 (72 y.o. M) Primary Care Provider: Caffie Damme Other Clinician: Referring Provider: Treating Provider/Extender: Murvin Natal Weeks in Treatment: 75 Active Problems ICD-10 Encounter Code Description Active Date MDM Diagnosis [phone] Pressure ulcer of left buttock, stage 3 03/19/2022 No Yes E66.01 Morbid (severe) obesity due to excess calories 03/19/2022 No Yes G62.89 Other specified polyneuropathies 03/19/2022 No Yes Z93.2 Ileostomy status 03/19/2022 No Yes NAYIB, HORNBAKER ([id number]) [id number] Page 5 of 10 E11.622 Type 2 diabetes mellitus with other skin ulcer 03/19/2022 No Yes Inactive Problems ICD-10 Code Description Active Date Inactive Date L98.492 Non-pressure chronic ulcer of skin of other sites with fat layer exposed 03/19/2022 03/19/2022 Resolved Problems Electronic Signature(s) Signed: 12/11/2022 11:20:36 AM By: Duanne Guess MD FACS Entered By: Duanne Guess on 12/11/2022  11:20:36 -------------------------------------------------------------------------------- Progress Note Details Patient Name: Date of Service: [name]. 12/11/2022 11:00 A M Medical Record Number: [id number] Patient Account Number: [id number] Date of Birth/Sex: Treating RN: [birth year] (72 y.o. M) Primary Care Provider: Caffie Damme Other Clinician: Referring Provider: Treating Provider/Extender: Haze Rushing in Treatment: 54 Subjective Chief Complaint Information obtained from Patient Here for follow up abdominal wound present following laparotomy, colostomy. 03/19/2022: Here for pressure ulcer on left gluteus and ulcer adjacent to ileostomy History of Present Illness (HPI) On medihoney with border dressing due to stalling on collagen and concern ABD was [name] off skin. 09/11/14 Nearly healed, counseled given the thin attenuated scar skin I expect will have some recurrence wounds with slight trauma or maceration, however much improved today. cont ABD pads and  medihoney. 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 He resides with his sister and is on a regular hospital bed. The 2 of them reported that for about the last year, he has had ulceration adjacent to his ileostomy as well as on his left gluteus and upper posterior thigh. Most recent hemoglobin A1c was 6.3%. On his left gluteus, there is a stage II pressure ulcer with surrounding tissue maceration and excoriation. There is a little slough on the wound surface. Adjacent to his ileostomy, associated with his appliance, there is an ulcer that extends into the fat layer. It  is clean without any slough or necrotic tissue. 04/10/2022: The left gluteal ulcer has expanded and is quite a bit larger today. There is slough and eschar accumulation. The patient's sister reports that the ileostomy associated wound got better and so they did not go to see the outpatient ostomy nurse. They have not received the low-air-loss mattress. 05/01/2022: Apparently the low-air-loss mattress has been denied by his insurance, but they are willing to cover a gel mattress overlay. The ileostomy-area wound has contracted further, per the patient's sister; we are not managing this wound at this time. The left gluteal ulcer seems to be about the same size, but there is extensive slough on all of the open surfaces. His skin is extremely friable. 05/22/2022: The gel mattress overlay was finally delivered last week. The left gluteal ulcer has deteriorated somewhat. There is slough on all of the open surfaces and the total area of the wound is larger. It has a funky odor to it today. 06/13/2022: The gluteal wound is smaller and has substantially less slough. Epithelium is filling in around the margins. The odor that was present at his last visit has dissipated. 07/03/2022: The gluteal wound is a little bit smaller again today. It seems more superficial and just has a light layer of slough on the surface. 08/07/2022: No significant change to the gluteal wound. The patient's sister does report that it has bled less and seems less friable. 09/05/2022: The gluteal wound looks a little bit better, with smaller dimensions and less friable tissue. He has had some breakdown on his perineum, adjacent to his scrotum. It appears similar in nature to the gluteal wound. BERTIE, RAO ([id number]) [id number] Page 6 of 10 10/03/2022: The wounds all look a little bit more superficial today. They are less friable. He does have a lot of scaly dry skin around each of the wound areas. There is  slough on the wound surfaces. 11/11/2022: The wound dimensions are basically unchanged. The perineal ulcer looks a little bit deeper. He has scaly shaggy skin hanging from all of the wound surfaces along with some slough. 12/11/2022: All of the open areas have converged and appear deeper today. There is slough on the surfaces along with some hypertrophic granulation tissue. Patient History Information obtained from Patient. Family History Cancer - Father,Mother, Diabetes - Father,Mother,Siblings, Heart Disease - Mother,Siblings, Hypertension - Father,Siblings, Kidney Disease - Siblings. Social History Former smoker - ended on 08/10/2013, Marital Status - Single, Alcohol Use - Rarely, Drug Use - No History, Caffeine Use - Rarely. Medical History Constitutional Symptoms (General Health) Patient has history of Congestive Heart Failure Eyes Denies history of Cataracts, Glaucoma,  Optic Neuritis Ear/Nose/Mouth/Throat Denies history of Chronic sinus problems/congestion, Middle ear problems Cardiovascular Patient has history of Hypertension Endocrine Patient has history of Type II Diabetes Denies history of Type 1 Diabetes, Type I Diabetes Genitourinary Denies history of End Stage Renal Disease Neurologic Patient has history of Neuropathy, Paraplegia Psychiatric Denies history of Anorexia/bulimia, Confinement Anxiety Patient is treated with Oral Agents. Medical A Surgical History Notes nd Cardiovascular atrial fibrillation Musculoskeletal arthritis Objective Constitutional no acute distress. Vitals Time Taken: 11:00 AM, Height: 77 in, Weight: 318 lbs, BMI: 37.7, Temperature: 97.7 F, Pulse: 66 bpm, Respiratory Rate: 18 breaths/min, Blood Pressure: 118/81 mmHg, Capillary Blood Glucose: 125 mg/dl. Respiratory Normal work of breathing on room air. General Notes: 12/11/2022: All of the open areas have converged and appear deeper today. There is slough on the surfaces along with some  hypertrophic granulation tissue. Integumentary (Hair, Skin) Wound #3 status is Open. Original cause of wound was Gradually Appeared. The date acquired was: 05/14/2021. The wound has been in treatment 38 weeks. The wound is located on the Left Gluteus. The wound measures 10cm length x 15cm width x 0.1cm depth; 117.81cm^2 area and 11.781cm^3 volume. There is Fat Layer (Subcutaneous Tissue) exposed. There is no tunneling or undermining noted. There is a medium amount of serosanguineous drainage noted. The wound margin is distinct with the outline attached to the wound base. There is large (67-100%) red, friable, hyper - granulation within the wound bed. There is a small (1- 33%) amount of necrotic tissue within the wound bed including Adherent Slough. The periwound skin appearance had no abnormalities noted for color. The periwound skin appearance exhibited: Scarring, Dry/Scaly. The periwound skin appearance did not exhibit: Callus, Crepitus, Excoriation, Induration, Rash, Maceration. Periwound temperature was noted as No Abnormality. The periwound has tenderness on palpation. Wound #5 status is Converted. Original cause of wound was Gradually Appeared. The date acquired was: 09/05/2022. The wound has been in treatment 13 weeks. The wound is located on the Perineum. The wound measures 1cm length x 1cm width x 0.1cm depth; 0.785cm^2 area and 0.079cm^3 volume. There is Fat Layer (Subcutaneous Tissue) exposed. There is no tunneling or undermining noted. There is a medium amount of serosanguineous drainage noted. The wound margin is distinct with the outline attached to the wound base. There is medium (34-66%) red, pink, hyper - granulation within the wound bed. There is a medium (34-66%) amount of necrotic tissue within the wound bed including Adherent Slough. The periwound skin appearance had no abnormalities noted for texture. The periwound skin appearance had no abnormalities noted for color. The periwound  skin appearance did not exhibit: Dry/Scaly, Maceration. Periwound temperature was noted as No Abnormality. The periwound has tenderness on palpation. 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 There was a Selective/Open Wound Non-Viable Tissue Debridement with a total area of 117.75 sq cm performed by Duanne Guess, MD. With the following instrument(s): Curette to remove Non-Viable tissue/material. Material removed includes [facility] after achieving pain control using Lidocaine 4% Topical Solution. No specimens were taken. A time out was conducted at 11:10, prior to the start of the procedure. A Minimum amount of bleeding was controlled with Pressure. The procedure was tolerated well. Post Debridement Measurements: 10cm  length x 15cm width x 0.1cm depth; 11.781cm^3 volume. Post debridement Stage noted as Category/Stage III. Character of Wound/Ulcer Post Debridement is improved. Post procedure Diagnosis Wound #3: Same as Pre-Procedure General Notes: Scribed for Dr [name] by Bruce Pulling, RN. Plan Follow-up Appointments: Return appointment in 1 month. - *****STRETCHER*******Dr. Lady [name] Rm 3 Anesthetic: Wound #3 Left Gluteus: (In clinic) Topical Lidocaine 4% applied to wound bed Bathing/ Shower/ Hygiene: May shower and wash wound with soap and water. Off-Loading: Wound #3 Left Gluteus: Gel mattress overlay (Group 1) Turn and reposition every 2 hours The following medication(s) was prescribed: lidocaine topical 4 % cream cream topical once daily was prescribed at facility WOUND #3: - Gluteus Wound Laterality: Left Cleanser: Soap  and Water 1 x Per Day/30 Days Discharge Instructions: May shower and wash wound with dial antibacterial soap and water prior to dressing change. Cleanser: Wound Cleanser (Generic) 1 x Per Day/30 Days Discharge Instructions: Cleanse the wound with wound cleanser prior to applying a clean dressing using gauze sponges, not tissue or cotton balls. Peri-Wound Care: Zinc Oxide Ointment 30g tube (Generic) 1 x Per Day/30 Days Discharge Instructions: Apply Zinc Oxide to periwound with each dressing change Topical: Gentamicin (Generic) 1 x Per Day/30 Days Discharge Instructions: As directed by physician Prim Dressing: Maxorb Extra Ag+ Alginate Dressing, 4x4.75 (in/in) 1 x Per Day/30 Days ary Discharge Instructions: Apply to wound bed as instructed Secondary Dressing: ABD Pad, 8x10 (Generic) 1 x Per Day/30 Days Discharge Instructions: Apply over primary dressing as directed. Secondary Dressing: Zetuvit Plus 4x8 in 1 x Per Day/30 Days Discharge Instructions: Apply over primary dressing as directed. Secured With: 8M Medipore Scientist, research (life sciences) Surgical T 2x10 (in/yd) (Generic) 1 x Per Day/30 Days ape Discharge Instructions: Secure with tape as directed. 12/11/2022: All of the open areas have converged and appear deeper today. There is slough on the surfaces along with some hypertrophic granulation tissue. I used a curette to debride the slough from the wound surfaces. I think the progression of the wounds is likely due to inadequate moisture control. I am going to change his contact layer to silver alginate on the entire wound surface. We will continue the periwound zinc oxide and topical gentamicin. Follow-up in 1 month. Electronic Signature(s) Signed: 01/15/2023 2:57:36 PM By: Duanne Guess MD FACS Previous Signature: 12/11/2022 11:25:00 AM Version By: Duanne Guess MD FACS Entered By: Duanne Guess on 01/15/2023 14:57:36 [name] ([id number]) [id number] Page 8 of  10 -------------------------------------------------------------------------------- HxROS Details Patient Name: Date of Service: [name] 12/11/2022 11:00 A M Medical Record Number: [id number] Patient Account Number: [id number] Date of Birth/Sex: Treating RN: 28-Sep-1950 (72 y.o. M) Primary Care Provider: Caffie Damme Other Clinician: Referring Provider: Treating Provider/Extender: Haze Rushing in Treatment: 48 Information Obtained From Patient Constitutional Symptoms (General Health) Medical History: Positive for: Congestive Heart Failure Eyes Medical History: Negative for: Cataracts; Glaucoma; Optic Neuritis Ear/Nose/Mouth/Throat Medical History: Negative for: Chronic sinus problems/congestion; Middle ear problems Cardiovascular Medical History: Positive for: Hypertension Past Medical History Notes: atrial fibrillation Endocrine Medical History: Positive for: Type II Diabetes Negative for: Type 1 Diabetes; Type I Diabetes Time with diabetes: less then 10 Treated with: Oral agents Genitourinary Medical History: Negative for: End Stage Renal Disease Musculoskeletal Medical History: Past Medical History Notes: arthritis Neurologic Medical History: Positive for: Neuropathy; Paraplegia Psychiatric Medical History: Negative for: Anorexia/bulimia; Confinement Anxiety Immunizations Pneumococcal Vaccine: Received Pneumococcal Vaccination: Yes Received Pneumococcal Vaccination On or After 60th Birthday: Yes Implantable Devices None [place], [place]  S ([id number]) [id number] Page 9 of 10 Family and Social History Cancer: Yes - Father,Mother; Diabetes: Yes - Father,Mother,Siblings; Heart Disease: Yes - Mother,Siblings; Hypertension: Yes - Father,Siblings; Kidney Disease: Yes - Siblings; Former smoker - ended on 08/10/2013; Marital Status - Single; Alcohol Use: Rarely; Drug Use: No History; Caffeine Use:  Rarely; Financial Concerns: No; Food, Clothing or Shelter Needs: No; Support System Lacking: No; Transportation Concerns: No Electronic Signature(s) Signed: 03/16/2023 4:15:21 PM By: Duanne Guess MD FACS Previous Signature: 12/11/2022 12:27:09 PM Version By: Duanne Guess MD FACS Entered By: Duanne Guess on 01/15/2023 14:57:21 -------------------------------------------------------------------------------- SuperBill Details Patient Name: Date of Service: [name] 12/11/2022 Medical Record Number: [id number] Patient Account Number: [id number] Date of Birth/Sex: Treating RN: 11-Feb-1951 (72 y.o. 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 Facility Procedures : CPT4 Code: 21308657 Description: 97597 - DEBRIDE WOUND 1ST 20 SQ CM OR < ICD-10 Diagnosis Description L89.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, stage 3 Modifier: Quantity: 1 : CPT4 Code: 84696295 Description: 97598 - DEBRIDE WOUND EA ADDL 20 SQ CM ICD-10 Diagnosis Description L89.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, stage 3 Modifier: Quantity: 5 Physician Procedures : CPT4 Code Description Modifier [id number] 99214 - WC PHYS LEVEL 4 - EST PT 25 ICD-10 Diagnosis Description L89.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, stage 3 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.89 Other specified polyneuropathies E11.622 Type 2 diabetes mellitus with other skin ulcer  E66.01 Morbid (severe) obesity due to excess calories Quantity: 1 : [id number] 97597 - WC PHYS DEBR WO ANESTH 20 SQ CM ICD-10 Diagnosis Description L89.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, stage 3 Quantity: 1 : [id number] 97598 - WC PHYS  DEBR WO ANESTH EA ADD 20 CM ICD-10 Diagnosis Description L89.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, stage 3 Quantity: 5 Electronic Signature(s) Signed: 12/11/2022 11:25:32 AM By: Duanne Guess MD FACS [name] ([id number]) [id number] Page 10 of 10 Entered By: Duanne Guess on 12/11/2022 11:25:32

## 2022-12-11 NOTE — Progress Notes (Signed)
QWANELL, PELEGRIN ([id number]) [id number] Page 1 of 8 Visit Report for 12/11/2022 Arrival Information Details Patient Name: Date of Service: [name] 12/11/2022 11:00 A M Medical Record Number: [id number] Patient Account Number: [id number] Date of Birth/Sex: Treating RN: April 03, 1951 (72 y.o. [name] Primary Care Bobette Leyh: Caffie Damme Other Clinician: Referring Geniya Fulgham: Treating Taffy Delconte/Extender: Haze Rushing in Treatment: 15 Visit Information History Since Last Visit Added or deleted any medications: No Patient Arrived: Stretcher Any new allergies or adverse reactions: No Arrival Time: 10:45 Had a fall or experienced change in No Accompanied By: sister activities of daily living that may affect Transfer Assistance: Manual risk of falls: Patient Identification Verified: Yes Signs or symptoms of abuse/neglect since last visito No Secondary Verification Process Completed: Yes Hospitalized since last visit: No Patient Requires Transmission-Based Precautions: No Implantable device outside of the clinic excluding No Patient Has Alerts: No cellular tissue based products placed in the center since last visit: Has Dressing in Place as Prescribed: Yes Pain Present Now: Yes Electronic Signature(s) Signed: 12/11/2022 3:40:07 PM By: Redmond Pulling RN, BSN Entered By: Redmond Pulling on 12/11/2022 11:01:13 -------------------------------------------------------------------------------- Encounter Discharge Information Details Patient Name: Date of Service: [name]. 12/11/2022 11:00 A M Medical Record Number: [id number] Patient Account Number: [id number] Date of Birth/Sex: Treating RN: November 27, 1950 (72 y.o. [name] Primary Care Cindia Hustead: Caffie Damme Other Clinician: Referring Pryce Folts: Treating Rayen Dafoe/Extender: Haze Rushing in Treatment: 65 Encounter Discharge Information Items Post  Procedure Vitals Discharge Condition: Stable Temperature (F): 97.7 Ambulatory Status: Stretcher Pulse (bpm): 66 Discharge Destination: Home Respiratory Rate (breaths/min): 18 Transportation: Ambulance Blood Pressure (mmHg): 118/81 Accompanied By: sister Schedule Follow-up Appointment: Yes Clinical Summary of Care: Patient Declined Electronic Signature(s) Signed: 12/11/2022 3:40:07 PM By: Redmond Pulling RN, BSN Entered By: Redmond Pulling on 12/11/2022 11:37:17 [name] ([id number]) [id number] Page 2 of 8 -------------------------------------------------------------------------------- Lower Extremity Assessment Details Patient Name: Date of Service: [name] 12/11/2022 11:00 A M Medical Record Number: [id number] Patient Account Number: [id number] Date of Birth/Sex: Treating RN: 1950-07-24 (72 y.o. [name] Primary Care Macil Crady: Caffie Damme Other Clinician: Referring Maggie Senseney: Treating Herbert Marken/Extender: Murvin Natal Weeks in Treatment: 67 Electronic Signature(s) Signed: 12/11/2022 3:40:07 PM By: Redmond Pulling RN, BSN Entered By: Redmond Pulling on 12/11/2022 11:08:23 -------------------------------------------------------------------------------- Multi Wound Chart Details Patient Name: Date of Service: [name]. 12/11/2022 11:00 A M Medical Record Number: [id number] Patient Account Number: [id number] Date of Birth/Sex: Treating RN: July 09, 1950 (71 y.o. M) Primary Care Lavaeh Bau: Caffie Damme Other Clinician: Referring Zarian Colpitts: Treating Canaan Prue/Extender: Murvin Natal Weeks in Treatment: 49 Vital Signs Height(in): 77 Capillary Blood Glucose(mg/dl): 742 Weight(lbs): 595 Pulse(bpm): 66 Body Mass Index(BMI): 37.7 Blood Pressure(mmHg): 118/81 Temperature(F): 97.7 Respiratory Rate(breaths/min): 18 [3:Photos:] [5:No Photos] [N/A:N/A] Left Gluteus Perineum N/A Wound Location: Gradually  Appeared Gradually Appeared N/A Wounding Event: Pressure Ulcer Lesion N/A Primary Etiology: Congestive Heart Failure, Congestive Heart Failure, N/A Comorbid History: Hypertension, Type I Diabetes, Hypertension, Type I Diabetes, Neuropathy, Paraplegia Neuropathy, Paraplegia 05/14/2021 09/05/2022 N/A Date Acquired: 40 13 N/A Weeks of Treatment: Open Converted N/A Wound Status: No No N/A Wound Recurrence: 10x15x0.1 1x1x0.1 N/A Measurements L x W x D (cm) 117.81 0.785 N/A A (cm) : rea 11.781 0.079 N/A Volume (cm) : 29.40% 40.50% N/A % Reduction in Area: 29.40% 40.20% N/A % Reduction in Volume: Category/Stage III Full Thickness Without Exposed N/A Classification: Support Structures Medium Medium N/A Exudate Amount: Serosanguineous  Serosanguineous N/A Exudate Type: red, brown red, brown N/A Exudate Color: Distinct, outline attached Distinct, outline attached N/A Wound Margin: Large (67-100%) Medium (34-66%) N/A Granulation Amount: MERICK, LAPID ([id number]) [id number] Page 3 of 8 Red, Hyper-granulation, Friable Red, Pink, Hyper-granulation N/A Granulation Quality: Small (1-33%) Medium (34-66%) N/A Necrotic Amount: Fat Layer (Subcutaneous Tissue): Yes Fat Layer (Subcutaneous Tissue): Yes N/A Exposed Structures: Fascia: No Fascia: No Tendon: No Tendon: No Muscle: No Muscle: No Joint: No Joint: No Bone: No Bone: No Small (1-33%) None N/A Epithelialization: Debridement - Selective/Open Wound N/A N/A Debridement: Pre-procedure Verification/Time Out 11:10 N/A N/A Taken: Lidocaine 4% Topical Solution N/A N/A Pain Control: Slough N/A N/A Tissue Debrided: Non-Viable Tissue N/A N/A Level: 117.75 N/A N/A Debridement A (sq cm): rea Curette N/A N/A Instrument: Minimum N/A N/A Bleeding: Pressure N/A N/A Hemostasis A chieved: Procedure was tolerated well N/A N/A Debridement Treatment Response: 10x15x0.1 N/A N/A Post Debridement  Measurements L x W x D (cm) 11.781 N/A N/A Post Debridement Volume: (cm) Category/Stage III N/A N/A Post Debridement Stage: Scarring: Yes No Abnormalities Noted N/A Periwound Skin Texture: Excoriation: No Induration: No Callus: No Crepitus: No Rash: No Dry/Scaly: Yes Maceration: No N/A Periwound Skin Moisture: Maceration: No Dry/Scaly: No Atrophie Blanche: No No Abnormalities Noted N/A Periwound Skin Color: Cyanosis: No Ecchymosis: No Erythema: No Hemosiderin Staining: No Mottled: No Pallor: No Rubor: No No Abnormality No Abnormality N/A Temperature: Yes Yes N/A Tenderness on Palpation: Debridement N/A N/A Procedures Performed: Treatment Notes Electronic Signature(s) Signed: 12/11/2022 11:21:27 AM By: Duanne Guess MD FACS Entered By: Duanne Guess on 12/11/2022 11:21:27 -------------------------------------------------------------------------------- Multi-Disciplinary Care Plan Details Patient Name: Date of Service: [name]. 12/11/2022 11:00 A M Medical Record Number: [id number] Patient Account Number: [id number] Date of Birth/Sex: Treating RN: 1951/03/08 (72 y.o. [name] Primary Care Bernis Schreur: Caffie Damme Other Clinician: Referring Guss Farruggia: Treating Rockie Vawter/Extender: Haze Rushing in Treatment: 38 Multidisciplinary Care Plan reviewed with physician Active Inactive Nutrition Nursing Diagnoses: Potential for alteratiion in Nutrition/Potential for imbalanced nutrition Goals: Patient/caregiver verbalizes understanding of need to maintain therapeutic glucose control per primary care physician Date Initiated: 03/19/2022 Target Resolution Date: 03/10/2023 SPARSH, KEAST ([id number]) [phone].pdf Page 4 of 8 Goal Status: Active Interventions: Provide education on elevated blood sugars and impact on wound healing Treatment Activities: Dietary management education, guidance and counseling :  03/19/2022 Notes: Wound/Skin Impairment Nursing Diagnoses: Knowledge deficit related to ulceration/compromised skin integrity Goals: Ulcer/skin breakdown will have a volume reduction of 30% by week 4 Date Initiated: 03/19/2022 Target Resolution Date: 03/10/2023 Goal Status: Active Interventions: Assess ulceration(s) every visit Provide education on ulcer and skin care Treatment Activities: Skin care regimen initiated : 03/19/2022 Notes: Electronic Signature(s) Signed: 12/11/2022 3:40:07 PM By: Redmond Pulling RN, BSN Entered By: Redmond Pulling on 12/11/2022 11:09:23 -------------------------------------------------------------------------------- Pain Assessment Details Patient Name: Date of Service: [name]. 12/11/2022 11:00 A M Medical Record Number: [id number] Patient Account Number: [id number] Date of Birth/Sex: Treating RN: 1950/06/14 (72 y.o. [name] Primary Care Anakin Varkey: Caffie Damme Other Clinician: Referring Isay Perleberg: Treating Shantal Roan/Extender: Haze Rushing in Treatment: 15 Active Problems Location of Pain Severity and Description of Pain Patient Has Paino Yes Site Locations Rate the pain. Current Pain Level: 10 Pain Management and Medication Current Pain Management: ROSHON, RAMALEY ([id number]) [id number] Page 5 of 8 Electronic Signature(s) Signed: 12/11/2022 3:40:07 PM By: Redmond Pulling RN, BSN Entered By: Redmond Pulling on 12/11/2022 11:08:06 -------------------------------------------------------------------------------- Patient/Caregiver Education Details Patient Name: Date  of Service: [name] 8/1/2024andnbsp11:00 A M Medical Record Number: [id number] Patient Account Number: [id number] Date of Birth/Gender: Treating RN: 04-12-1951 (72 y.o. [name] Primary Care Physician: Caffie Damme Other Clinician: Referring Physician: Treating Physician/Extender: Haze Rushing in Treatment: 71 Education Assessment Education Provided To: Patient Education Topics Provided Wound/Skin Impairment: Methods: Explain/Verbal Responses: State content correctly [employer]) Signed: 12/11/2022 3:40:07 PM By: Redmond Pulling RN, BSN Entered By: Redmond Pulling on 12/11/2022 11:09:46 -------------------------------------------------------------------------------- Wound Assessment Details Patient Name: Date of Service: [name]. 12/11/2022 11:00 A M Medical Record Number: [id number] Patient Account Number: [id number] Date of Birth/Sex: Treating RN: [birth year] (72 y.o. [name] Primary Care Maydell Knoebel: Caffie Damme Other Clinician: Referring Zacharie Portner: Treating Tierre Gerard/Extender: Murvin Natal Weeks in Treatment: 38 Wound Status Wound Number: 3 Primary Pressure Ulcer Etiology: Wound Location: Left Gluteus Wound Open Wounding Event: Gradually Appeared Status: Date Acquired: 05/14/2021 Comorbid Congestive Heart Failure, Hypertension, Type I Diabetes, Weeks Of Treatment: 38 History: Neuropathy, Paraplegia Clustered Wound: No Photos ARNET, MICHAELSON ([id number]) [id number] Page 6 of 8 Wound Measurements Length: (cm) 10 Width: (cm) 15 Depth: (cm) 0.1 Area: (cm) 117.81 Volume: (cm) 11.781 % Reduction in Area: 29.4% % Reduction in Volume: 29.4% Epithelialization: Small (1-33%) Tunneling: No Undermining: No Wound Description Classification: Category/Stage III Wound Margin: Distinct, outline attached Exudate Amount: Medium Exudate Type: Serosanguineous Exudate Color: red, brown Foul Odor After Cleansing: No Slough/Fibrino Yes Wound Bed Granulation Amount: Large (67-100%) Exposed Structure Granulation Quality: Red, Hyper-granulation, Friable Fascia Exposed: No Necrotic Amount: Small (1-33%) Fat Layer (Subcutaneous Tissue) Exposed: Yes Necrotic Quality: Adherent Slough Tendon Exposed:  No Muscle Exposed: No Joint Exposed: No Bone Exposed: No Periwound Skin Texture Texture Color No Abnormalities Noted: No No Abnormalities Noted: Yes Callus: No Temperature / Pain Crepitus: No Temperature: No Abnormality Excoriation: No Tenderness on Palpation: Yes Induration: No Rash: No Scarring: Yes Moisture No Abnormalities Noted: No Dry / Scaly: Yes Maceration: No Treatment Notes Wound #3 (Gluteus) Wound Laterality: Left Cleanser Soap and Water Discharge Instruction: May shower and wash wound with dial antibacterial soap and water prior to dressing change. Wound Cleanser Discharge Instruction: Cleanse the wound with wound cleanser prior to applying a clean dressing using gauze sponges, not tissue or cotton balls. Peri-Wound Care Zinc Oxide Ointment 30g tube Discharge Instruction: Apply Zinc Oxide to periwound with each dressing change Topical Gentamicin Discharge Instruction: As directed by physician Primary Dressing Maxorb Extra Ag+ Alginate Dressing, 4x4.75 (in/in) Discharge Instruction: Apply to wound bed as instructed Secondary Dressing EYAN, GUSTAVESON ([id number]) [id number].pdf Page 7 of 8 ABD Pad, 8x10 Discharge Instruction: Apply over primary dressing as directed. Zetuvit Plus 4x8 in Discharge Instruction: Apply over primary dressing as directed. Secured With [employer] T 2x10 (in/yd) ape Discharge Instruction: Secure with tape as directed. Compression Wrap Compression Stockings Add-Ons Electronic Signature(s) Signed: 12/11/2022 3:38:42 PM By: Samuella Bruin Signed: 12/11/2022 3:40:07 PM By: Redmond Pulling RN, BSN Entered By: Samuella Bruin on 12/11/2022 11:08:08 -------------------------------------------------------------------------------- Wound Assessment Details Patient Name: Date of Service: [name]. 12/11/2022 11:00 A M Medical Record Number: [id number] Patient Account Number:  [id number] Date of Birth/Sex: Treating RN: 1950/08/09 (72 y.o. [name] Primary Care Ruari Duggan: Caffie Damme Other Clinician: Referring Colene Mines: Treating Krupa Stege/Extender: Murvin Natal Weeks in Treatment: 38 Wound Status Wound Number: 5 Primary Lesion Etiology: Wound Location: Perineum Wound Converted Wounding Event: Gradually Appeared Status: Date Acquired: 09/05/2022 Comorbid Congestive Heart  Failure, Hypertension, Type I Diabetes, Weeks Of Treatment: 13 History: Neuropathy, Paraplegia Clustered Wound: No Wound Measurements Length: (cm) 1 Width: (cm) 1 Depth: (cm) 0.1 Area: (cm) 0.785 Volume: (cm) 0.079 % Reduction in Area: 40.5% % Reduction in Volume: 40.2% Epithelialization: None Tunneling: No Undermining: No Wound Description Classification: Full Thickness Without Exposed Support Structures Wound Margin: Distinct, outline attached Exudate Amount: Medium Exudate Type: Serosanguineous Exudate Color: red, brown Foul Odor After Cleansing: No Slough/Fibrino No Wound Bed Granulation Amount: Medium (34-66%) Exposed Structure Granulation Quality: Red, Pink, Hyper-granulation Fascia Exposed: No Necrotic Amount: Medium (34-66%) Fat Layer (Subcutaneous Tissue) Exposed: Yes Necrotic Quality: Adherent Slough Tendon Exposed: No Muscle Exposed: No Joint Exposed: No Bone Exposed: No Periwound Skin Texture Texture Color No Abnormalities Noted: Yes No Abnormalities Noted: Yes Moisture Temperature / Pain No Abnormalities Noted: No Temperature: No Abnormality Dry / Scaly: No Tenderness on PalpationValentino Hue ALIN, MAISTO ([id number]) [id number] Page 8 of 8 Maceration: No Treatment Notes Wound #5 (Perineum) Cleanser Peri-Wound Care Topical Primary Dressing Secondary Dressing Secured With Compression Wrap Compression Stockings Add-Ons Electronic Signature(s) Signed: 12/11/2022 3:40:07 PM By: Redmond Pulling RN,  BSN Entered By: Redmond Pulling on 12/11/2022 11:07:17 -------------------------------------------------------------------------------- Vitals Details Patient Name: Date of Service: [name]. 12/11/2022 11:00 A M Medical Record Number: [id number] Patient Account Number: [id number] Date of Birth/Sex: Treating RN: [birth year] (72 y.o. [name] Primary Care Pauline Pegues: Caffie Damme Other Clinician: Referring Ana Woodroof: Treating Geovonni Meyerhoff/Extender: Murvin Natal Weeks in Treatment: 38 Vital Signs Time Taken: 11:00 Temperature (F): 97.7 Height (in): 77 Pulse (bpm): 66 Weight (lbs): 318 Respiratory Rate (breaths/min): 18 Body Mass Index (BMI): 37.7 Blood Pressure (mmHg): 118/81 Capillary Blood Glucose (mg/dl): 782 Reference Range: 80 - 120 mg / dl Electronic Signature(s) Signed: 12/11/2022 3:40:07 PM By: Redmond Pulling RN, BSN Entered By: Redmond Pulling on 12/11/2022 11:01:52

## 2022-12-25 ENCOUNTER — Other Ambulatory Visit: Payer: Self-pay | Admitting: Internal Medicine

## 2022-12-28 ENCOUNTER — Other Ambulatory Visit: Payer: Self-pay | Admitting: Internal Medicine

## 2022-12-28 DIAGNOSIS — I480 Paroxysmal atrial fibrillation: Secondary | ICD-10-CM

## 2022-12-29 NOTE — Telephone Encounter (Signed)
Prescription refill request for Pradaxa received.  Indication:Afib Last office visit:1/24 Weight:144.2  kg Age:72 Scr:0.63  5/24 CrCl:222.53  ml/min  Prescription refilled

## 2023-01-08 ENCOUNTER — Encounter (HOSPITAL_BASED_OUTPATIENT_CLINIC_OR_DEPARTMENT_OTHER): Payer: Medicare Other | Admitting: General Surgery

## 2023-01-08 DIAGNOSIS — E11622 Type 2 diabetes mellitus with other skin ulcer: Secondary | ICD-10-CM | POA: Diagnosis not present

## 2023-01-22 NOTE — Progress Notes (Signed)
[name], [name] ([id number]) [id number] Page 1 of 7 Visit Report for 01/08/2023 Arrival Information Details Patient Name: Date of Service: [name] 01/08/2023 11:00 A M Medical Record Number: [id number] Patient Account Number: [id number] Date of Birth/Sex: Treating RN: 12/10/1950 (72 y.o. [name] Primary Care Zyara Riling: Caffie Damme Other Clinician: Referring Lailah Marcelli: Treating Cleve Paolillo/Extender: Haze Rushing in Treatment: 41 Visit Information History Since Last Visit Added or deleted any medications: No Patient Arrived: Stretcher Any new allergies or adverse reactions: No Arrival Time: 10:52 Had a fall or experienced change in No Accompanied By: sister, EMS activities of daily living that may affect Transfer Assistance: Stretcher risk of falls: Patient Identification Verified: Yes Signs or symptoms of abuse/neglect since last visito No Secondary Verification Process Completed: Yes Hospitalized since last visit: No Patient Requires Transmission-Based Precautions: No Implantable device outside of the clinic excluding No Patient Has Alerts: No cellular tissue based products placed in the center since last visit: Has Dressing in Place as Prescribed: Yes Pain Present Now: No Electronic Signature(s) Signed: 01/22/2023 2:13:46 PM By: Tommie Ard RN Entered By: Tommie Ard on 01/08/2023 10:53:07 -------------------------------------------------------------------------------- Encounter Discharge Information Details Patient Name: Date of Service: [name]. 01/08/2023 11:00 A M Medical Record Number: [id number] Patient Account Number: [id number] Date of Birth/Sex: Treating RN: 24-Sep-1950 (72 y.o. [name] Primary Care Shekelia Boutin: Caffie Damme Other Clinician: Referring Redell Bhandari: Treating Arseniy Toomey/Extender: Haze Rushing in Treatment: 53 Encounter Discharge Information Items Post  Procedure Vitals Discharge Condition: Stable Temperature (F): 98.1 Ambulatory Status: Stretcher Pulse (bpm): 90 Discharge Destination: Home Respiratory Rate (breaths/min): 18 Transportation: Ambulance Blood Pressure (mmHg): 143/84 Accompanied By: sister Schedule Follow-up Appointment: Yes Clinical Summary of Care: Electronic Signature(s) Signed: 01/08/2023 12:47:14 PM By: Tommie Ard RN Entered By: Tommie Ard on 01/08/2023 12:47:14 [name] ([id number]) [id number] Page 2 of 7 -------------------------------------------------------------------------------- Lower Extremity Assessment Details Patient Name: Date of Service: [name] 01/08/2023 11:00 A M Medical Record Number: [id number] Patient Account Number: [id number] Date of Birth/Sex: Treating RN: 1950/07/16 (72 y.o. [name] Primary Care Quaran Kedzierski: Caffie Damme Other Clinician: Referring Tysheena Ginzburg: Treating Justus Droke/Extender: Murvin Natal Weeks in Treatment: 42 Electronic Signature(s) Signed: 01/22/2023 2:13:46 PM By: Tommie Ard RN Entered By: Tommie Ard on 01/08/2023 11:05:40 -------------------------------------------------------------------------------- Multi Wound Chart Details Patient Name: Date of Service: [name]. 01/08/2023 11:00 A M Medical Record Number: [id number] Patient Account Number: [id number] Date of Birth/Sex: Treating RN: [birth year] (72 y.o. M) Primary Care Samuel Rittenhouse: Caffie Damme Other Clinician: Referring Caitriona Sundquist: Treating Maryuri Warnke/Extender: Murvin Natal Weeks in Treatment: 42 Vital Signs Height(in): 77 Pulse(bpm): 90 Weight(lbs): 318 Blood Pressure(mmHg): 143/84 Body Mass Index(BMI): 37.7 Temperature(F): 98.1 Respiratory Rate(breaths/min): 18 [3:Photos:] [[name]/A:[name]/A] Left Gluteus [name]/A [name]/A Wound Location: Gradually Appeared [name]/A [name]/A Wounding Event: Pressure Ulcer [name]/A [name]/A Primary  Etiology: Congestive Heart Failure, [name]/A [name]/A Comorbid History: Hypertension, Type I Diabetes, Neuropathy, Paraplegia 05/14/2021 [name]/A [name]/A Date Acquired: 26 [name]/A [name]/A Weeks of Treatment: Open [name]/A [name]/A Wound Status: No [name]/A [name]/A Wound Recurrence: 12x15.5x0.1 [name]/A [name]/A Measurements L x W x D (cm) 146.084 [name]/A [name]/A A (cm) : rea 14.608 [name]/A [name]/A Volume (cm) : 12.50% [name]/A [name]/A % Reduction in A rea: 12.50% [name]/A [name]/A % Reduction in Volume: Category/Stage III [name]/A [name]/A Classification: Medium [name]/A [name]/A Exudate A mount: Serosanguineous [name]/A [name]/A Exudate Type: red, brown [name]/A [name]/A Exudate Color: Distinct, outline attached [name]/A [name]/A Wound Margin: Large (67-100%) [name]/A [name]/A Granulation  A mount: Red, Hyper-granulation, Friable [name]/A [name]/A Granulation QualityKAMIR, [name] ([id number]) [id number] Page 3 of 7 Small (1-33%) [name]/A [name]/A Necrotic Amount: Fat Layer (Subcutaneous Tissue): Yes [name]/A [name]/A Exposed Structures: Fascia: No Tendon: No Muscle: No Joint: No Bone: No Small (1-33%) [name]/A [name]/A Epithelialization: Debridement - Selective/Open Wound [name]/A [name]/A Debridement: Pre-procedure Verification/Time Out 11:14 [name]/A [name]/A Taken: Lidocaine 5% topical ointment [name]/A [name]/A Pain Control: Necrotic/Eschar, Slough [name]/A [name]/A Tissue Debrided: Non-Viable Tissue [name]/A [name]/A Level: 73 [name]/A [name]/A Debridement A (sq cm): rea Curette [name]/A [name]/A Instrument: Minimum [name]/A [name]/A Bleeding: Pressure [name]/A [name]/A Hemostasis A chieved: Procedure was tolerated well [name]/A [name]/A Debridement Treatment Response: 12x15.5x0.1 [name]/A [name]/A Post Debridement Measurements L x W x D (cm) 14.608 [name]/A [name]/A Post Debridement Volume: (cm) Category/Stage III [name]/A [name]/A Post Debridement Stage: Scarring: Yes [name]/A [name]/A Periwound Skin Texture: Excoriation: No Induration: No Callus: No Crepitus: No Rash: No Dry/Scaly: Yes [name]/A [name]/A Periwound Skin Moisture: Maceration: No Atrophie Blanche: No [name]/A [name]/A Periwound Skin Color: Cyanosis: No Ecchymosis:  No Erythema: No Hemosiderin Staining: No Mottled: No Pallor: No Rubor: No No Abnormality [name]/A [name]/A Temperature: Yes [name]/A [name]/A Tenderness on Palpation: Debridement [name]/A [name]/A Procedures Performed: Treatment Notes Electronic Signature(s) Signed: 01/08/2023 11:20:17 AM By: Duanne Guess MD FACS Entered By: Duanne Guess on 01/08/2023 11:20:16 -------------------------------------------------------------------------------- Multi-Disciplinary Care Plan Details Patient Name: Date of Service: [name]. 01/08/2023 11:00 A M Medical Record Number: [id number] Patient Account Number: [id number] Date of Birth/Sex: Treating RN: 1950-08-05 (72 y.o. [name] Primary Care Cabe Lashley: Caffie Damme Other Clinician: Referring Errin Whitelaw: Treating Mushka Laconte/Extender: Haze Rushing in Treatment: 71 Multidisciplinary Care Plan reviewed with physician Active Inactive Nutrition Nursing Diagnoses: Potential for alteratiion in Nutrition/Potential for imbalanced nutrition Goals: Patient/caregiver verbalizes understanding of need to maintain therapeutic glucose control per primary care physician Date Initiated: 03/19/2022 Target Resolution Date: 03/10/2023 Goal Status: Active [name], [name] ([id number]) [id number] Page 4 of 7 Interventions: Provide education on elevated blood sugars and impact on wound healing Treatment Activities: Dietary management education, guidance and counseling : 03/19/2022 Notes: Wound/Skin Impairment Nursing Diagnoses: Knowledge deficit related to ulceration/compromised skin integrity Goals: Ulcer/skin breakdown will have a volume reduction of 30% by week 4 Date Initiated: 03/19/2022 Target Resolution Date: 03/10/2023 Goal Status: Active Interventions: Assess ulceration(s) every visit Provide education on ulcer and skin care Treatment Activities: Skin care regimen initiated : 03/19/2022 Notes: Electronic  Signature(s) Signed: 01/08/2023 12:46:13 PM By: Tommie Ard RN Previous Signature: 01/08/2023 11:09:08 AM Version By: Tommie Ard RN Entered By: Tommie Ard on 01/08/2023 12:46:12 -------------------------------------------------------------------------------- Pain Assessment Details Patient Name: Date of Service: [name]. 01/08/2023 11:00 A M Medical Record Number: [id number] Patient Account Number: [id number] Date of Birth/Sex: Treating RN: 07/06/1950 (72 y.o. [name] Primary Care Lysa Livengood: Caffie Damme Other Clinician: Referring Rayson Rando: Treating Trei Schoch/Extender: Haze Rushing in Treatment: 68 Active Problems Location of Pain Severity and Description of Pain Patient Has Paino Yes Site Locations Pain Management and Medication Current Pain Management: [name], [name] ([id number]) [id number] Page 5 of 7 Goals for Pain Management chronic pain Electronic Signature(s) Signed: 01/22/2023 2:13:46 PM By: Tommie Ard RN Entered By: Tommie Ard on 01/08/2023 10:57:22 -------------------------------------------------------------------------------- Patient/Caregiver Education Details Patient Name: Date of Service: [name], [name] S. 8/29/2024andnbsp11:00 A M Medical Record Number: [id number] Patient Account Number: [id number] Date of Birth/Gender: Treating RN: June 18, 1950 (72 y.o. [name] Primary Care Physician: Caffie Damme Other Clinician: Referring Physician: Treating Physician/Extender: Duanne Guess  Caffie Damme Weeks in Treatment: 42 Education Assessment Education Provided To: Patient Education Topics Provided Wound Debridement: Methods: Explain/Verbal Responses: Reinforcements needed, State content correctly Wound/Skin Impairment: Methods: Explain/Verbal Responses: Reinforcements needed, State content correctly Electronic Signature(s) Signed: 01/22/2023 2:13:46 PM By: Tommie Ard  RN Entered By: Tommie Ard on 01/08/2023 11:14:11 -------------------------------------------------------------------------------- Wound Assessment Details Patient Name: Date of Service: [name]. 01/08/2023 11:00 A M Medical Record Number: [id number] Patient Account Number: [id number] Date of Birth/Sex: Treating RN: [birth year] (72 y.o. [name] Primary Care Gillermo Poch: Caffie Damme Other Clinician: Referring Shirlene Andaya: Treating Chasitee Zenker/Extender: Murvin Natal Weeks in Treatment: 42 Wound Status Wound Number: 3 Primary Pressure Ulcer Etiology: Wound Location: Left Gluteus Wound Open Wounding Event: Gradually Appeared Status: Date Acquired: 05/14/2021 Comorbid Congestive Heart Failure, Hypertension, Type I Diabetes, Weeks Of Treatment: 42 History: Neuropathy, Paraplegia Clustered Wound: No Photos [name], [name] ([id number]) [id number] Page 6 of 7 Wound Measurements Length: (cm) 12 Width: (cm) 15.5 Depth: (cm) 0.1 Area: (cm) 146.084 Volume: (cm) 14.608 % Reduction in Area: 12.5% % Reduction in Volume: 12.5% Epithelialization: Small (1-33%) Tunneling: No Undermining: No Wound Description Classification: Category/Stage III Wound Margin: Distinct, outline attached Exudate Amount: Medium Exudate Type: Serosanguineous Exudate Color: red, brown Foul Odor After Cleansing: No Slough/Fibrino Yes Wound Bed Granulation Amount: Large (67-100%) Exposed Structure Granulation Quality: Red, Hyper-granulation, Friable Fascia Exposed: No Necrotic Amount: Small (1-33%) Fat Layer (Subcutaneous Tissue) Exposed: Yes Necrotic Quality: Adherent Slough Tendon Exposed: No Muscle Exposed: No Joint Exposed: No Bone Exposed: No Periwound Skin Texture Texture Color No Abnormalities Noted: No No Abnormalities Noted: Yes Callus: No Temperature / Pain Crepitus: No Temperature: No Abnormality Excoriation: No Tenderness on Palpation:  Yes Induration: No Rash: No Scarring: Yes Moisture No Abnormalities Noted: No Dry / Scaly: Yes Maceration: No Treatment Notes Wound #3 (Gluteus) Wound Laterality: Left Cleanser Soap and Water Discharge Instruction: May shower and wash wound with dial antibacterial soap and water prior to dressing change. Wound Cleanser Discharge Instruction: Cleanse the wound with wound cleanser prior to applying a clean dressing using gauze sponges, not tissue or cotton balls. Peri-Wound Care Topical Primary Dressing MediHoney Gel, tube 1.5 (oz) Discharge Instruction: Apply to wound bed as instructed MediHoney Calcium Alginate Dressing 4x5 in Discharge Instruction: Apply to wound bed as instructed Secondary Dressing ABD Pad, 8x10 Discharge Instruction: Apply over primary dressing as directed. [name], [name] ([id number]) [id number] Page 7 of 7 Secured With [employer] T 2x10 (in/yd) ape Discharge Instruction: Secure with tape as directed. Compression Wrap Compression Stockings Add-Ons Electronic Signature(s) Signed: 01/22/2023 2:13:46 PM By: Tommie Ard RN Entered By: Tommie Ard on 01/08/2023 10:59:51 -------------------------------------------------------------------------------- Vitals Details Patient Name: Date of Service: [name]. 01/08/2023 11:00 A M Medical Record Number: [id number] Patient Account Number: [id number] Date of Birth/Sex: Treating RN: 08/21/1950 (72 y.o. [name] Primary Care Evangela Heffler: Caffie Damme Other Clinician: Referring Aliza Moret: Treating Lorean Ekstrand/Extender: Murvin Natal Weeks in Treatment: 42 Vital Signs Time Taken: 10:54 Temperature (F): 98.1 Height (in): 77 Pulse (bpm): 90 Weight (lbs): 318 Respiratory Rate (breaths/min): 18 Body Mass Index (BMI): 37.7 Blood Pressure (mmHg): 143/84 Reference Range: 80 - 120 mg / dl Electronic Signature(s) Signed: 01/22/2023 2:13:46 PM  By: Tommie Ard RN Entered By: Tommie Ard on 01/08/2023 10:54:21

## 2023-01-22 NOTE — Progress Notes (Signed)
BATTISTA, WINKELMANN ([id number]) [id number] Page 1 of 9 Visit Report for 01/08/2023 Chief Complaint Document Details Patient Name: Date of Service: [name] 01/08/2023 11:00 A M Medical Record Number: [id number] Patient Account Number: [id number] Date of Birth/Sex: Treating RN: 1950/09/11 (72 y.o. M) Primary Care Provider: Caffie Damme Other Clinician: Referring Provider: Treating Provider/Extender: Haze Rushing in Treatment: 23 Information Obtained from: Patient Chief Complaint Here for follow up abdominal wound present following laparotomy, colostomy. 03/19/2022: Here for pressure ulcer on left gluteus and ulcer adjacent to ileostomy Electronic Signature([name]) Signed: 01/08/2023 11:20:33 AM By: Duanne Guess MD FACS Entered By: Duanne Guess on 01/08/2023 11:20:32 -------------------------------------------------------------------------------- Debridement Details Patient Name: Date of Service: [name]. 01/08/2023 11:00 A M Medical Record Number: [id number] Patient Account Number: [id number] Date of Birth/Sex: Treating RN: 07-Jun-1950 (72 y.o. [name] Primary Care Provider: Caffie Damme Other Clinician: Referring Provider: Treating Provider/Extender: Haze Rushing in Treatment: 42 Debridement Performed for Assessment: Wound #3 Left Gluteus Performed By: Physician Duanne Guess, MD Debridement Type: Debridement Level of Consciousness (Pre-procedure): Awake and Alert Pre-procedure Verification/Time Out Yes - 11:14 Taken: Start Time: 01:15 Pain Control: Lidocaine 5% topical ointment Percent of Wound Bed Debrided: 50% T Area Debrided (cm): otal 73 Tissue and other material debrided: Non-Viable, Eschar, Slough, Slough Level: Non-Viable Tissue Debridement Description: Selective/Open Wound Instrument: Curette Bleeding: Minimum Hemostasis Achieved: Pressure Response to Treatment:  Procedure was tolerated well Level of Consciousness (Post- Awake and Alert procedure): Post Debridement Measurements of Total Wound Length: (cm) 12 Stage: Category/Stage III Width: (cm) 15.5 Depth: (cm) 0.1 Volume: (cm) 14.608 Character of Wound/Ulcer Post Debridement: Requires Further Debridement Post Procedure Diagnosis [name], [name] ([id number]) [id number] Page 2 of 9 Same as Pre-procedure Notes Scribed for Dr. Lady Gary by Tommie Ard, RN Electronic Signature([name]) Signed: 01/08/2023 11:32:26 AM By: Duanne Guess MD FACS Signed: 01/22/2023 2:13:46 PM By: Tommie Ard RN Entered By: Tommie Ard on 01/08/2023 11:19:09 -------------------------------------------------------------------------------- HPI Details Patient Name: Date of Service: [name]. 01/08/2023 11:00 A M Medical Record Number: [id number] Patient Account Number: [id number] Date of Birth/Sex: Treating RN: 05/18/1950 (72 y.o. M) Primary Care Provider: Caffie Damme Other Clinician: Referring Provider: Treating Provider/Extender: Haze Rushing in Treatment: 56 History of Present Illness HPI Description: On medihoney with border dressing due to stalling on collagen and concern ABD was pulling off skin. 09/11/14 Nearly healed, counseled given the thin attenuated scar skin I expect will have some recurrence wounds with slight trauma or maceration, however much improved today. cont ABD pads and medihoney. 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 He resides with his sister and is on a regular hospital bed. The 2 of them  reported that for about the last year, he has had ulceration adjacent to his ileostomy as well as on his left gluteus and upper posterior thigh. Most recent hemoglobin A1c was 6.3%. On his left gluteus, there is a stage II pressure ulcer with surrounding tissue maceration and excoriation. There is a little slough on the wound surface. Adjacent to his ileostomy, associated with his appliance, there is an ulcer that extends into the fat layer. It is clean without any slough or necrotic tissue.  04/10/2022: The left gluteal ulcer has expanded and is quite a bit larger today. There is slough and eschar accumulation. The patient'[name] sister reports that the ileostomy associated wound got better and so they did not go to see the outpatient ostomy nurse. They have not received the low-air-loss mattress. 05/01/2022: Apparently the low-air-loss mattress has been denied by his insurance, but they are willing to cover a gel mattress overlay. The ileostomy-area wound has contracted further, per the patient'[name] sister; we are not managing this wound at this time. The left gluteal ulcer seems to be about the same size, but there is extensive slough on all of the open surfaces. His skin is extremely friable. 05/22/2022: The gel mattress overlay was finally delivered last week. The left gluteal ulcer has deteriorated somewhat. There is slough on all of the open surfaces and the total area of the wound is larger. It has a funky odor to it today. 06/13/2022: The gluteal wound is smaller and has substantially less slough. Epithelium is filling in around the margins. The odor that was present at his last visit has dissipated. 07/03/2022: The gluteal wound is a little bit smaller again today. It seems more superficial and just has a light layer of slough on the surface. 08/07/2022: No significant change to the gluteal wound. The patient'[name] sister does report that it has bled less and seems less friable. 09/05/2022: The gluteal wound  looks a little bit better, with smaller dimensions and less friable tissue. He has had some breakdown on his perineum, adjacent to his scrotum. It appears similar in nature to the gluteal wound. 10/03/2022: The wounds all look a little bit more superficial today. They are less friable. He does have a lot of scaly dry skin around each of the wound areas. There is slough on the wound surfaces. 11/11/2022: The wound dimensions are basically unchanged. The perineal ulcer looks a little bit deeper. He has scaly shaggy skin hanging from all of the wound surfaces along with some slough. 12/11/2022: All of the open areas have converged and appear deeper today. There is slough on the surfaces along with some hypertrophic granulation tissue. 01/08/2023: There has been some epithelialization between the wound sites so we are back to a patchwork of open areas. There is some slough accumulation on the surfaces with a little bit of eschar. No malodor or purulent drainage. Electronic Signature([name]) Signed: 01/08/2023 11:21:30 AM By: Duanne Guess MD FACS Entered By: Duanne Guess on 01/08/2023 11:21:30 [name] ([id number]) [id number] Page 3 of 9 -------------------------------------------------------------------------------- Physical Exam Details Patient Name: Date of Service: [name] 01/08/2023 11:00 A M Medical Record Number: [id number] Patient Account Number: [id number] Date of Birth/Sex: Treating RN: Jun 20, 1950 (72 y.o. M) Primary Care Provider: Caffie Damme Other Clinician: Referring Provider: Treating Provider/Extender: Murvin Natal Weeks in Treatment: 41 Constitutional Slightly hypertensive. . . . no acute distress. Respiratory Normal work of breathing on room air. Notes 01/08/2023: There has been some epithelialization between the wound sites so we are back to a patchwork of open areas. There is some slough accumulation on the surfaces  with a little bit of eschar. No malodor or purulent drainage. Electronic Signature([name]) Signed: 01/08/2023 11:22:08 AM By: Duanne Guess MD FACS Entered By: Duanne Guess on 01/08/2023 11:22:08 -------------------------------------------------------------------------------- Physician Orders Details Patient Name: Date of Service: [name]. 01/08/2023 11:00 A M Medical Record Number: [id number] Patient Account Number: [id number] Date of Birth/Sex: Treating RN: 06/07/1950 (71 y.o. 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 - *****STRETCHER*******Dr. Lady Gary Rm 3 Anesthetic Wound #3 Left Gluteus (In clinic) Topical Lidocaine 4% applied to wound bed Bathing/ Shower/ Hygiene May shower and wash wound with soap and water. Off-Loading Wound #3 Left Gluteus Gel mattress overlay (Group 1) Turn and reposition every 2 hours [name], [name] ([id number]) [id number] Page 4 of 9 Wound Treatment Wound #3 - Gluteus Wound Laterality: Left Cleanser: Soap and Water 1 x Per Day/30 Days Discharge Instructions: May shower and wash wound with dial antibacterial soap and water prior to dressing change. Cleanser: Wound Cleanser (Generic) 1 x Per Day/30 Days Discharge Instructions: Cleanse the wound with wound cleanser prior to applying a clean dressing using gauze sponges, not tissue or cotton balls. Prim Dressing: MediHoney Gel, tube 1.5 (oz) 1 x Per Day/30 Days ary Discharge Instructions: Apply  to wound bed as instructed Prim Dressing: MediHoney Calcium Alginate Dressing 4x5 in (DME) (Generic) 1 x Per Day/30 Days ary Discharge Instructions: Apply to wound bed as instructed Secondary Dressing: ABD Pad, 8x10 (DME) (Generic) 1 x Per Day/30 Days Discharge Instructions: Apply over primary dressing as directed. Secured With: 59M Medipore Scientist, research (life sciences) Surgical T 2x10 (in/yd) (DME) (Generic) 1 x Per Day/30 Days ape Discharge Instructions: Secure with tape as directed. Electronic Signature([name]) Signed: 01/08/2023 4:06:12 PM By: Duanne Guess MD FACS Signed: 01/22/2023 2:13:46 PM By: Tommie Ard RN Previous Signature: 01/08/2023 11:32:26 AM Version By: Duanne Guess MD FACS Previous Signature: 01/08/2023 11:08:38 AM Version By: Tommie Ard RN Entered By: Tommie Ard on 01/08/2023 12:45:59 -------------------------------------------------------------------------------- Problem List Details Patient Name: Date of Service: [name] 01/08/2023 11:00 A M Medical Record Number: [id number] Patient Account Number: [id number] Date of Birth/Sex: Treating RN: 1951/01/23 (72 y.o. M) Primary Care Provider: Caffie Damme Other Clinician: Referring Provider: Treating Provider/Extender: Murvin Natal Weeks in Treatment: 77 Active Problems ICD-10 Encounter Code Description Active Date MDM Diagnosis [phone] Pressure ulcer of left buttock, stage 3 03/19/2022 No Yes E11.622 Type 2 diabetes mellitus with other skin ulcer 03/19/2022 No Yes E66.01 Morbid (severe) obesity due to excess calories 03/19/2022 No Yes G62.89 Other specified polyneuropathies 03/19/2022 No Yes Z93.2 Ileostomy status 03/19/2022 No Yes Inactive Problems ICD-10 Code Description Active Date Inactive Date [name], [name] ([id number]) [id number] Page 5 of 9 [phone] Non-pressure chronic ulcer of skin of other sites with fat layer exposed 03/19/2022 03/19/2022 Resolved  Problems Electronic Signature([name]) Signed: 01/08/2023 11:20:07 AM By: Duanne Guess MD FACS Entered By: Duanne Guess on 01/08/2023 11:20:06 -------------------------------------------------------------------------------- Progress Note Details Patient Name: Date of Service: [name]. 01/08/2023 11:00 A M Medical Record Number: [id number] Patient Account Number: [id number] Date of Birth/Sex: Treating RN: Aug 20, 1950 (72 y.o. M) Primary Care Provider: Caffie Damme Other Clinician: Referring Provider: Treating Provider/Extender: Haze Rushing in Treatment: 19 Subjective Chief Complaint Information obtained from Patient Here for follow up abdominal wound present following laparotomy, colostomy. 03/19/2022: Here for pressure ulcer on left gluteus and ulcer adjacent to ileostomy History of Present Illness (HPI) On medihoney with border dressing due to stalling on collagen and concern ABD was pulling off skin.  09/11/14 Nearly healed, counseled given the thin attenuated scar skin I expect will have some recurrence wounds with slight trauma or maceration, however much improved today. cont ABD pads and medihoney. 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 He resides with his sister and is on a regular hospital bed. The 2 of them reported that for about the last year, he has had ulceration adjacent to his ileostomy as well as on his left gluteus and upper posterior thigh. Most recent hemoglobin A1c was 6.3%. On his left gluteus, there is a stage II pressure ulcer with surrounding tissue maceration and excoriation. There is a little  slough on the wound surface. Adjacent to his ileostomy, associated with his appliance, there is an ulcer that extends into the fat layer. It is clean without any slough or necrotic tissue. 04/10/2022: The left gluteal ulcer has expanded and is quite a bit larger today. There is slough and eschar accumulation. The patient'[name] sister reports that the ileostomy associated wound got better and so they did not go to see the outpatient ostomy nurse. They have not received the low-air-loss mattress. 05/01/2022: Apparently the low-air-loss mattress has been denied by his insurance, but they are willing to cover a gel mattress overlay. The ileostomy-area wound has contracted further, per the patient'[name] sister; we are not managing this wound at this time. The left gluteal ulcer seems to be about the same size, but there is extensive slough on all of the open surfaces. His skin is extremely friable. 05/22/2022: The gel mattress overlay was finally delivered last week. The left gluteal ulcer has deteriorated somewhat. There is slough on all of the open surfaces and the total area of the wound is larger. It has a funky odor to it today. 06/13/2022: The gluteal wound is smaller and has substantially less slough. Epithelium is filling in around the margins. The odor that was present at his last visit has dissipated. 07/03/2022: The gluteal wound is a little bit smaller again today. It seems more superficial and just has a light layer of slough on the surface. 08/07/2022: No significant change to the gluteal wound. The patient'[name] sister does report that it has bled less and seems less friable. 09/05/2022: The gluteal wound looks a little bit better, with smaller dimensions and less friable tissue. He has had some breakdown on his perineum, adjacent to his scrotum. It appears similar in nature to the gluteal wound. 10/03/2022: The wounds all look a little bit more superficial today. They are less friable. He does have a lot of  scaly dry skin around each of the wound areas. There is slough on the wound surfaces. 11/11/2022: The wound dimensions are basically unchanged. The perineal ulcer looks a little bit deeper. He has scaly shaggy skin hanging from all of the wound surfaces along with some slough. 12/11/2022: All of the open areas have converged and appear deeper today. There is slough on the surfaces along with some hypertrophic granulation tissue. 01/08/2023: There has been some epithelialization between the wound sites so we are back to a patchwork of open areas. There is some slough accumulation on the surfaces with a little bit of eschar. No malodor or purulent drainage. Patient History Information obtained from Patient. [name], [name] (  [id number]) [id number] Page 6 of 9 Family History Cancer - Father,Mother, Diabetes - Father,Mother,Siblings, Heart Disease - Mother,Siblings, Hypertension - Father,Siblings, Kidney Disease - Siblings. Social History Former smoker - ended on 08/10/2013, Marital Status - Single, Alcohol Use - Rarely, Drug Use - No History, Caffeine Use - Rarely. Medical History Constitutional Symptoms ([name]) Patient has history of Congestive Heart Failure Eyes Denies history of Cataracts, Glaucoma, Optic Neuritis Ear/Nose/Mouth/Throat Denies history of Chronic sinus problems/congestion, Middle ear problems Cardiovascular Patient has history of Hypertension Endocrine Patient has history of Type I Diabetes Denies history of Type 1 Diabetes Genitourinary Denies history of End Stage Renal Disease Neurologic Patient has history of Neuropathy, Paraplegia Psychiatric Denies history of Anorexia/bulimia, Confinement Anxiety Medical A Surgical History Notes nd Cardiovascular atrial fibrillation Musculoskeletal arthritis Objective Constitutional Slightly hypertensive. no acute distress. Vitals Time Taken: 10:54 AM, Height: 77 in, Weight: 318 lbs, BMI: 37.7,  Temperature: 98.1 F, Pulse: 90 bpm, Respiratory Rate: 18 breaths/min, Blood Pressure: 143/84 mmHg. Respiratory Normal work of breathing on room air. [name] Notes: 01/08/2023: There has been some epithelialization between the wound sites so we are back to a patchwork of open areas. There is some slough accumulation on the surfaces with a little bit of eschar. No malodor or purulent drainage. Integumentary (Hair, Skin) Wound #3 status is Open. Original cause of wound was Gradually Appeared. The date acquired was: 05/14/2021. The wound has been in treatment 42 weeks. The wound is located on the Left Gluteus. The wound measures 12cm length x 15.5cm width x 0.1cm depth; 146.084cm^2 area and 14.608cm^3 volume. There is Fat Layer (Subcutaneous Tissue) exposed. There is no tunneling or undermining noted. There is a medium amount of serosanguineous drainage noted. The wound margin is distinct with the outline attached to the wound base. There is large (67-100%) red, friable, hyper - granulation within the wound bed. There is a small (1-33%) amount of necrotic tissue within the wound bed including Adherent Slough. The periwound skin appearance had no abnormalities noted for color. The periwound skin appearance exhibited: Scarring, Dry/Scaly. The periwound skin appearance did not exhibit: Callus, Crepitus, Excoriation, Induration, Rash, Maceration. Periwound temperature was noted as No Abnormality. The periwound has tenderness on palpation. 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 There was a Selective/Open Wound Non-Viable Tissue Debridement  with a total area of 73 sq cm performed by Duanne Guess, MD. With the following instrument([name]): Curette to remove Non-Viable tissue/material. Material removed includes Eschar and Slough and after achieving pain control using Lidocaine 5% topical ointment. No specimens were taken. A time out was conducted at 11:14, prior to the start of the procedure. A Minimum amount of bleeding was controlled with Pressure. The procedure was tolerated well. Post Debridement Measurements: 12cm length x 15.5cm width x 0.1cm depth; 14.608cm^3 volume. Post debridement Stage noted as Category/Stage III. Character of Wound/Ulcer Post Debridement requires further debridement. Post procedure Diagnosis Wound #3: Same as Pre-Procedure [name] Notes: Scribed for Dr. Lady Gary by Tommie Ard, RN. Plan Follow-up Appointments: Return appointment in 1 month. - *****STRETCHER*******Dr. Lady Gary Rm 3 Anesthetic: Wound #3 Left Gluteus: (In clinic) Topical Lidocaine 4% applied to wound bed Bathing/ Shower/ Hygiene: May shower and wash wound with soap and water.  Off-Loading: Wound #3 Left Gluteus: Gel mattress overlay (Group 1) Turn and reposition every 2 hours WOUND #3: - Gluteus Wound Laterality: Left Cleanser: Soap and Water 1 x Per Day/30 Days Discharge Instructions: May shower and wash wound with dial antibacterial soap and water prior to dressing change. Cleanser: Wound Cleanser (Generic) 1 x Per Day/30 Days Discharge Instructions: Cleanse the wound with wound cleanser prior to applying a clean dressing using gauze sponges, not tissue or cotton balls. Prim Dressing: MediHoney Gel, tube 1.5 (oz) 1 x Per Day/30 Days ary Discharge Instructions: Apply to wound bed as instructed Prim Dressing: MediHoney Calcium Alginate Dressing 4x5 in (DME) (Generic) 1 x Per Day/30 Days ary Discharge Instructions: Apply to wound bed as instructed Secondary Dressing: ABD Pad, 8x10 (DME) (Generic) 1 x Per Day/30 Days Discharge  Instructions: Apply over primary dressing as directed. Secured With: 78M Medipore Scientist, research (life sciences) Surgical T 2x10 (in/yd) (DME) (Generic) 1 x Per Day/30 Days ape Discharge Instructions: Secure with tape as directed. 01/08/2023: There has been some epithelialization between the wound sites so we are back to a patchwork of open areas. There is some slough accumulation on the surfaces with a little bit of eschar. No malodor or purulent drainage. I used a curette to debride slough and eschar from his wounds. Some of the slough was quite densely adherent and given the friability of his tissue, I was not able to completely remove it. For the next week, rather than using the topical gentamicin, I am going to have his sister apply Medihoney to the areas with the slough so that hopefully I will breakdown in the more easily removed. After week, they should resume using gentamicin. Continue silver alginate. Follow-up in 1 month. Electronic Signature([name]) Signed: 01/14/2023 1:37:38 PM By: Duanne Guess MD FACS Signed: 01/14/2023 6:09:26 PM By: Shawn Stall RN, BSN Previous Signature: 01/08/2023 11:24:41 AM Version By: Duanne Guess MD FACS Entered By: Shawn Stall on 01/14/2023 10:41:00 -------------------------------------------------------------------------------- HxROS Details Patient Name: Date of Service: [name]. 01/08/2023 11:00 A M Medical Record Number: [id number] Patient Account Number: [id number] Date of Birth/Sex: Treating RN: [birth year] (72 y.o. M) Primary Care Provider: Caffie Damme Other Clinician: Referring Provider: Treating Provider/Extender: Haze Rushing in Treatment: 72 Information Obtained From Patient Constitutional Symptoms ([name]) Medical History: Positive for: Congestive Heart Failure [name], [name] ([id number]) [id number] Page 8 of 9 Eyes Medical History: Negative for: Cataracts; Glaucoma; Optic  Neuritis Ear/Nose/Mouth/Throat Medical History: Negative for: Chronic sinus problems/congestion; Middle ear problems Cardiovascular Medical History: Positive for: Hypertension Past Medical History Notes: atrial fibrillation Endocrine Medical History: Positive for: Type I Diabetes Negative for: Type 1 Diabetes Time with diabetes: less then 10 Treated with: Oral agents Genitourinary Medical History: Negative for: End Stage Renal Disease Musculoskeletal Medical History: Past Medical History Notes: arthritis Neurologic Medical History: Positive for: Neuropathy; Paraplegia Psychiatric Medical History: Negative for: Anorexia/bulimia; Confinement Anxiety Immunizations Pneumococcal Vaccine: Received Pneumococcal Vaccination: Yes Received Pneumococcal Vaccination On or After 60th Birthday: Yes Implantable Devices None Family and Social History Cancer: Yes - Father,Mother; Diabetes: Yes - Father,Mother,Siblings; Heart Disease: Yes - Mother,Siblings; Hypertension: Yes - Father,Siblings; Kidney Disease: Yes - Siblings; Former smoker - ended on 08/10/2013; Marital Status - Single; Alcohol Use: Rarely; Drug Use: No History; Caffeine Use: Rarely; Financial Concerns: No; Food, Clothing or Shelter Needs: No; Support System Lacking: No; Transportation Concerns: No Electronic Signature([name]) Signed: 01/08/2023 11:32:26 AM By: Duanne Guess MD FACS Entered By: Duanne Guess on 01/08/2023 11:21:40 -------------------------------------------------------------------------------- SuperBill Details  Patient Name: Date of Service: [name] 01/08/2023 Medical Record Number: [id number] Patient Account Number: [id number] [name], [name] ([id number]) [id number] Page 9 of 9 Date of Birth/Sex: Treating RN: November 09, 1950 (72 y.o. 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 Facility Procedures : CPT4 Code: [id number] Description: 97597 - DEBRIDE WOUND 1ST 20 SQ CM OR < ICD-10 Diagnosis Description L89.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, stage 3 Modifier: Quantity: 1 : CPT4 Code: [id number] Description: 97598 - DEBRIDE WOUND EA ADDL 20 SQ CM ICD-10 Diagnosis Description L89.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, stage 3 Modifier: Quantity: 3 Physician Procedures : CPT4 Code Description Modifier [id number] 99214 - WC PHYS LEVEL 4 - EST PT 25 ICD-10 Diagnosis Description L89.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, 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

## 2023-02-05 ENCOUNTER — Encounter (HOSPITAL_BASED_OUTPATIENT_CLINIC_OR_DEPARTMENT_OTHER): Payer: Medicare Other | Attending: General Surgery | Admitting: General Surgery

## 2023-02-05 DIAGNOSIS — G8220 Paraplegia, unspecified: Secondary | ICD-10-CM | POA: Insufficient documentation

## 2023-02-05 DIAGNOSIS — Z932 Ileostomy status: Secondary | ICD-10-CM | POA: Insufficient documentation

## 2023-02-05 DIAGNOSIS — Z6837 Body mass index (BMI) 37.0-37.9, adult: Secondary | ICD-10-CM | POA: Insufficient documentation

## 2023-02-05 DIAGNOSIS — E119 Type 2 diabetes mellitus without complications: Secondary | ICD-10-CM | POA: Diagnosis not present

## 2023-02-05 DIAGNOSIS — L89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, stage 3: Secondary | ICD-10-CM | POA: Diagnosis present

## 2023-02-05 DIAGNOSIS — Z9049 Acquired absence of other specified parts of digestive tract: Secondary | ICD-10-CM | POA: Diagnosis not present

## 2023-02-05 DIAGNOSIS — G6289 Other specified polyneuropathies: Secondary | ICD-10-CM | POA: Diagnosis not present

## 2023-02-05 NOTE — Progress Notes (Signed)
or necrotic tissue. 04/10/2022: The left gluteal ulcer has expanded and is quite a bit larger today. There is slough and eschar accumulation. The patient's sister reports that the ileostomy associated wound got better and so they did not go to see the outpatient ostomy nurse. They have not received the low-air-loss mattress. 05/01/2022: Apparently the low-air-loss mattress has been denied by his insurance, but they are willing to cover a gel mattress overlay. The ileostomy-area wound has contracted further, per the patient's sister; we are not managing this wound at this time. The left gluteal ulcer seems to be about the same size, but there is extensive slough on all of the open surfaces. His skin is extremely friable. 05/22/2022: The gel mattress overlay was finally delivered last week. The left gluteal ulcer has deteriorated somewhat. There is slough on all of the open surfaces and the total area of the wound is larger. It has a funky odor to it today. 06/13/2022: The gluteal wound is smaller and has substantially less slough. Epithelium is filling in around the margins. The odor that was present at his last visit has dissipated. 07/03/2022: The gluteal wound is a little bit smaller again today. It seems more superficial and just has a light layer of slough on the surface. 08/07/2022: No significant change to the gluteal wound. The patient's sister does report that it has bled less and seems  less friable. 09/05/2022: The gluteal wound looks a little bit better, with smaller dimensions and less friable tissue. He has had some breakdown on his perineum, adjacent to his scrotum. It appears similar in nature to the gluteal wound. 10/03/2022: The wounds all look a little bit more superficial today. They are less friable. He does have a lot of scaly dry skin around each of the wound areas. There is slough on the wound surfaces. 11/11/2022: The wound dimensions are basically unchanged. The perineal ulcer looks a little bit deeper. He has scaly shaggy skin hanging from all of the wound surfaces along with some slough. 12/11/2022: All of the open areas have converged and appear deeper today. There is slough on the surfaces along with some hypertrophic granulation tissue. 01/08/2023: There has been some epithelialization between the wound sites so we are back to a patchwork of open areas. There is some slough accumulation on the surfaces with a little bit of eschar. No malodor or purulent drainage. 02/05/2023: The wound area measured larger today. The patient's sister thinks that the Medihoney is keeping things too wet and contributed to some moisture- related breakdown. There is slough on all of the open wound surfaces. Electronic Signature(s) Signed: 02/05/2023 11:30:04 AM By: Duanne Guess MD FACS Entered By: Duanne Guess on 02/05/2023 11:30:04 [name] ([id number]) [id number] Page 3 of 9 -------------------------------------------------------------------------------- Physical Exam Details Patient Name: Date of Service: [name] 02/05/2023 11:00 A M Medical Record Number: [id number] Patient Account Number: [id number] Date of Birth/Sex: Treating RN: [name] (72 y.o. M) Primary Care Provider: Caffie Damme Other Clinician: Referring Provider: Treating Provider/Extender: Murvin Natal Weeks in Treatment:  48 Constitutional Slightly hypertensive. Tachycardic, asymptomatic. . . no acute distress. Respiratory Normal work of breathing on room air. Notes 02/05/2023: The wound area measured larger today. The patient's sister thinks that the Medihoney is keeping things too wet and contributed to some moisture- related breakdown. There is slough on all of the open wound surfaces. Electronic Signature(s) Signed: 02/05/2023 11:31:50 AM By: Duanne Guess MD FACS Entered By: Duanne Guess on 02/05/2023 11:31:50 --------------------------------------------------------------------------------  [name] ([id number]) [id number] Page 1 of 9 Visit Report for 02/05/2023 Chief Complaint Document Details Patient Name: Date of Service: [name] 02/05/2023 11:00 A M Medical Record Number: [id number] Patient Account Number: [id number] Date of Birth/Sex: Treating RN: [name]-06-07 (72 y.o. M) Primary Care Provider: Caffie Damme Other Clinician: Referring Provider: Treating Provider/Extender: Haze Rushing in Treatment: 1 Information Obtained from: Patient Chief Complaint Here for follow up abdominal wound present following laparotomy, colostomy. 03/19/2022: Here for pressure ulcer on left gluteus and ulcer adjacent to ileostomy Electronic Signature(s) Signed: 02/05/2023 11:29:13 AM By: Duanne Guess MD FACS Entered By: Duanne Guess on 02/05/2023 11:29:13 -------------------------------------------------------------------------------- Debridement Details Patient Name: Date of Service: [name]. 02/05/2023 11:00 A M Medical Record Number: [id number] Patient Account Number: [id number] Date of Birth/Sex: Treating RN: 10-04-[name] (72 y.o. Marlan Palau Primary Care Provider: Caffie Damme Other Clinician: Referring Provider: Treating Provider/Extender: Haze Rushing in Treatment: 46 Debridement Performed for Assessment: Wound #3 Left Gluteus Performed By: Physician Duanne Guess, MD The following information was scribed by: [name] The information was scribed for: Duanne Guess Debridement Type: Debridement Level of Consciousness (Pre-procedure): Awake and Alert Pre-procedure Verification/Time Out Yes - 10:51 Taken: Start Time: 10:51 Pain Control: Lidocaine 4% T opical Solution Percent of Wound Bed Debrided: 30% T Area Debrided (cm): otal 85.06 Tissue and other material debrided: Non-Viable, Slough, Slough Level: Non-Viable Tissue Debridement Description:  Selective/Open Wound Instrument: Curette Bleeding: Minimum Hemostasis Achieved: Pressure Response to Treatment: Procedure was tolerated well Level of Consciousness (Post- Awake and Alert procedure): Post Debridement Measurements of Total Wound Length: (cm) 17.2 Stage: Category/Stage III Width: (cm) 21 Depth: (cm) 0.1 Volume: (cm) 28.369 Character of Wound/Ulcer Post Debridement: Improved FELIX, CORRALES ([id number]) [id number] Page 2 of 9 Post Procedure Diagnosis Same as Pre-procedure Electronic Signature(s) Signed: 02/05/2023 12:58:46 PM By: Duanne Guess MD FACS Signed: 02/05/2023 3:34:33 PM By: [name] Entered By: [name] on 02/05/2023 11:00:26 -------------------------------------------------------------------------------- HPI Details Patient Name: Date of Service: [name]. 02/05/2023 11:00 A M Medical Record Number: [id number] Patient Account Number: [id number] Date of Birth/Sex: Treating RN: 09-12-[name] (72 y.o. M) Primary Care Provider: Caffie Damme Other Clinician: Referring Provider: Treating Provider/Extender: Haze Rushing in Treatment: 31 History of Present Illness HPI Description: On medihoney with border dressing due to stalling on collagen and concern ABD was pulling off skin. 09/11/14 Nearly healed, counseled given the thin attenuated scar skin I expect will have some recurrence wounds with slight trauma or maceration, however much improved today. cont ABD pads and medihoney. 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 He resides with his sister and  is on a regular hospital bed. The 2 of them reported that for about the last year, he has had ulceration adjacent to his ileostomy as well as on his left gluteus and upper posterior thigh. Most recent hemoglobin A1c was 6.3%. On his left gluteus, there is a stage II pressure ulcer with surrounding tissue maceration and excoriation. There is a little slough on the wound surface. Adjacent to his ileostomy, associated with his appliance, there is an ulcer that extends into the fat layer. It is clean without any slough  or necrotic tissue. 04/10/2022: The left gluteal ulcer has expanded and is quite a bit larger today. There is slough and eschar accumulation. The patient's sister reports that the ileostomy associated wound got better and so they did not go to see the outpatient ostomy nurse. They have not received the low-air-loss mattress. 05/01/2022: Apparently the low-air-loss mattress has been denied by his insurance, but they are willing to cover a gel mattress overlay. The ileostomy-area wound has contracted further, per the patient's sister; we are not managing this wound at this time. The left gluteal ulcer seems to be about the same size, but there is extensive slough on all of the open surfaces. His skin is extremely friable. 05/22/2022: The gel mattress overlay was finally delivered last week. The left gluteal ulcer has deteriorated somewhat. There is slough on all of the open surfaces and the total area of the wound is larger. It has a funky odor to it today. 06/13/2022: The gluteal wound is smaller and has substantially less slough. Epithelium is filling in around the margins. The odor that was present at his last visit has dissipated. 07/03/2022: The gluteal wound is a little bit smaller again today. It seems more superficial and just has a light layer of slough on the surface. 08/07/2022: No significant change to the gluteal wound. The patient's sister does report that it has bled less and seems  less friable. 09/05/2022: The gluteal wound looks a little bit better, with smaller dimensions and less friable tissue. He has had some breakdown on his perineum, adjacent to his scrotum. It appears similar in nature to the gluteal wound. 10/03/2022: The wounds all look a little bit more superficial today. They are less friable. He does have a lot of scaly dry skin around each of the wound areas. There is slough on the wound surfaces. 11/11/2022: The wound dimensions are basically unchanged. The perineal ulcer looks a little bit deeper. He has scaly shaggy skin hanging from all of the wound surfaces along with some slough. 12/11/2022: All of the open areas have converged and appear deeper today. There is slough on the surfaces along with some hypertrophic granulation tissue. 01/08/2023: There has been some epithelialization between the wound sites so we are back to a patchwork of open areas. There is some slough accumulation on the surfaces with a little bit of eschar. No malodor or purulent drainage. 02/05/2023: The wound area measured larger today. The patient's sister thinks that the Medihoney is keeping things too wet and contributed to some moisture- related breakdown. There is slough on all of the open wound surfaces. Electronic Signature(s) Signed: 02/05/2023 11:30:04 AM By: Duanne Guess MD FACS Entered By: Duanne Guess on 02/05/2023 11:30:04 [name] ([id number]) [id number] Page 3 of 9 -------------------------------------------------------------------------------- Physical Exam Details Patient Name: Date of Service: [name] 02/05/2023 11:00 A M Medical Record Number: [id number] Patient Account Number: [id number] Date of Birth/Sex: Treating RN: [name] (72 y.o. M) Primary Care Provider: Caffie Damme Other Clinician: Referring Provider: Treating Provider/Extender: Murvin Natal Weeks in Treatment:  48 Constitutional Slightly hypertensive. Tachycardic, asymptomatic. . . no acute distress. Respiratory Normal work of breathing on room air. Notes 02/05/2023: The wound area measured larger today. The patient's sister thinks that the Medihoney is keeping things too wet and contributed to some moisture- related breakdown. There is slough on all of the open wound surfaces. Electronic Signature(s) Signed: 02/05/2023 11:31:50 AM By: Duanne Guess MD FACS Entered By: Duanne Guess on 02/05/2023 11:31:50 --------------------------------------------------------------------------------  9 -------------------------------------------------------------------------------- SuperBill Details Patient Name: Date of Service: [name] 02/05/2023 Medical Record Number: [id number] Patient Account Number: [id number] Date of Birth/Sex: Treating RN: [birth year]12/[name] (72 y.o. 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 E66.01 Morbid (severe) obesity due to excess calories G62.89 Other specified polyneuropathies Z93.2 Ileostomy status Facility Procedures : CPT4 Code: [id number] Description: 97597 - DEBRIDE WOUND 1ST 20 SQ CM OR < ICD-10 Diagnosis Description L89.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, stage 3 Modifier: Quantity: 1 : CPT4 Code: [id number] Description: 97598 - DEBRIDE WOUND EA ADDL 20 SQ CM ICD-10 Diagnosis Description L89.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, stage 3 Modifier: Quantity: 4 Physician Procedures : CPT4 Code Description Modifier [id number] 99213 - WC PHYS LEVEL 3 - EST PT 25 ICD-10 Diagnosis Description L89.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, 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 E66.01 Morbid (severe) obesity due to excess  calories G62.89 Other specified polyneuropathies Quantity: 1 : 0630160 97597 - WC PHYS DEBR WO ANESTH 20 SQ CM ICD-10 Diagnosis Description L89.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, stage 3 Quantity: 1 : [id number] 97598 - WC PHYS DEBR WO ANESTH EA ADD 20 CM ICD-10 Diagnosis Description 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 M) Primary Care Provider: Caffie Damme Other Clinician: Referring Provider: Treating Provider/Extender: Haze Rushing in Treatment: 1 Information Obtained from: Patient Chief Complaint Here for follow up abdominal wound present following laparotomy, colostomy. 03/19/2022: Here for pressure ulcer on left gluteus and ulcer adjacent to ileostomy Electronic Signature(s) Signed: 02/05/2023 11:29:13 AM By: Duanne Guess MD FACS Entered By: Duanne Guess on 02/05/2023 11:29:13 -------------------------------------------------------------------------------- Debridement Details Patient Name: Date of Service: [name]. 02/05/2023 11:00 A M Medical Record Number: [id number] Patient Account Number: [id number] Date of Birth/Sex: Treating RN: 10-04-[name] (72 y.o. Marlan Palau Primary Care Provider: Caffie Damme Other Clinician: Referring Provider: Treating Provider/Extender: Haze Rushing in Treatment: 46 Debridement Performed for Assessment: Wound #3 Left Gluteus Performed By: Physician Duanne Guess, MD The following information was scribed by: [name] The information was scribed for: Duanne Guess Debridement Type: Debridement Level of Consciousness (Pre-procedure): Awake and Alert Pre-procedure Verification/Time Out Yes - 10:51 Taken: Start Time: 10:51 Pain Control: Lidocaine 4% T opical Solution Percent of Wound Bed Debrided: 30% T Area Debrided (cm): otal 85.06 Tissue and other material debrided: Non-Viable, Slough, Slough Level: Non-Viable Tissue Debridement Description:  Selective/Open Wound Instrument: Curette Bleeding: Minimum Hemostasis Achieved: Pressure Response to Treatment: Procedure was tolerated well Level of Consciousness (Post- Awake and Alert procedure): Post Debridement Measurements of Total Wound Length: (cm) 17.2 Stage: Category/Stage III Width: (cm) 21 Depth: (cm) 0.1 Volume: (cm) 28.369 Character of Wound/Ulcer Post Debridement: Improved FELIX, CORRALES ([id number]) [id number] Page 2 of 9 Post Procedure Diagnosis Same as Pre-procedure Electronic Signature(s) Signed: 02/05/2023 12:58:46 PM By: Duanne Guess MD FACS Signed: 02/05/2023 3:34:33 PM By: [name] Entered By: [name] on 02/05/2023 11:00:26 -------------------------------------------------------------------------------- HPI Details Patient Name: Date of Service: [name]. 02/05/2023 11:00 A M Medical Record Number: [id number] Patient Account Number: [id number] Date of Birth/Sex: Treating RN: 09-12-[name] (72 y.o. M) Primary Care Provider: Caffie Damme Other Clinician: Referring Provider: Treating Provider/Extender: Haze Rushing in Treatment: 31 History of Present Illness HPI Description: On medihoney with border dressing due to stalling on collagen and concern ABD was pulling off skin. 09/11/14 Nearly healed, counseled given the thin attenuated scar skin I expect will have some recurrence wounds with slight trauma or maceration, however much improved today. cont ABD pads and medihoney. 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 He resides with his sister and  is on a regular hospital bed. The 2 of them reported that for about the last year, he has had ulceration adjacent to his ileostomy as well as on his left gluteus and upper posterior thigh. Most recent hemoglobin A1c was 6.3%. On his left gluteus, there is a stage II pressure ulcer with surrounding tissue maceration and excoriation. There is a little slough on the wound surface. Adjacent to his ileostomy, associated with his appliance, there is an ulcer that extends into the fat layer. It is clean without any slough  or necrotic tissue. 04/10/2022: The left gluteal ulcer has expanded and is quite a bit larger today. There is slough and eschar accumulation. The patient's sister reports that the ileostomy associated wound got better and so they did not go to see the outpatient ostomy nurse. They have not received the low-air-loss mattress. 05/01/2022: Apparently the low-air-loss mattress has been denied by his insurance, but they are willing to cover a gel mattress overlay. The ileostomy-area wound has contracted further, per the patient's sister; we are not managing this wound at this time. The left gluteal ulcer seems to be about the same size, but there is extensive slough on all of the open surfaces. His skin is extremely friable. 05/22/2022: The gel mattress overlay was finally delivered last week. The left gluteal ulcer has deteriorated somewhat. There is slough on all of the open surfaces and the total area of the wound is larger. It has a funky odor to it today. 06/13/2022: The gluteal wound is smaller and has substantially less slough. Epithelium is filling in around the margins. The odor that was present at his last visit has dissipated. 07/03/2022: The gluteal wound is a little bit smaller again today. It seems more superficial and just has a light layer of slough on the surface. 08/07/2022: No significant change to the gluteal wound. The patient's sister does report that it has bled less and seems  less friable. 09/05/2022: The gluteal wound looks a little bit better, with smaller dimensions and less friable tissue. He has had some breakdown on his perineum, adjacent to his scrotum. It appears similar in nature to the gluteal wound. 10/03/2022: The wounds all look a little bit more superficial today. They are less friable. He does have a lot of scaly dry skin around each of the wound areas. There is slough on the wound surfaces. 11/11/2022: The wound dimensions are basically unchanged. The perineal ulcer looks a little bit deeper. He has scaly shaggy skin hanging from all of the wound surfaces along with some slough. 12/11/2022: All of the open areas have converged and appear deeper today. There is slough on the surfaces along with some hypertrophic granulation tissue. 01/08/2023: There has been some epithelialization between the wound sites so we are back to a patchwork of open areas. There is some slough accumulation on the surfaces with a little bit of eschar. No malodor or purulent drainage. 02/05/2023: The wound area measured larger today. The patient's sister thinks that the Medihoney is keeping things too wet and contributed to some moisture- related breakdown. There is slough on all of the open wound surfaces. Electronic Signature(s) Signed: 02/05/2023 11:30:04 AM By: Duanne Guess MD FACS Entered By: Duanne Guess on 02/05/2023 11:30:04 [name] ([id number]) [id number] Page 3 of 9 -------------------------------------------------------------------------------- Physical Exam Details Patient Name: Date of Service: [name] 02/05/2023 11:00 A M Medical Record Number: [id number] Patient Account Number: [id number] Date of Birth/Sex: Treating RN: [name] (72 y.o. M) Primary Care Provider: Caffie Damme Other Clinician: Referring Provider: Treating Provider/Extender: Murvin Natal Weeks in Treatment:  48 Constitutional Slightly hypertensive. Tachycardic, asymptomatic. . . no acute distress. Respiratory Normal work of breathing on room air. Notes 02/05/2023: The wound area measured larger today. The patient's sister thinks that the Medihoney is keeping things too wet and contributed to some moisture- related breakdown. There is slough on all of the open wound surfaces. Electronic Signature(s) Signed: 02/05/2023 11:31:50 AM By: Duanne Guess MD FACS Entered By: Duanne Guess on 02/05/2023 11:31:50 --------------------------------------------------------------------------------  or necrotic tissue. 04/10/2022: The left gluteal ulcer has expanded and is quite a bit larger today. There is slough and eschar accumulation. The patient's sister reports that the ileostomy associated wound got better and so they did not go to see the outpatient ostomy nurse. They have not received the low-air-loss mattress. 05/01/2022: Apparently the low-air-loss mattress has been denied by his insurance, but they are willing to cover a gel mattress overlay. The ileostomy-area wound has contracted further, per the patient's sister; we are not managing this wound at this time. The left gluteal ulcer seems to be about the same size, but there is extensive slough on all of the open surfaces. His skin is extremely friable. 05/22/2022: The gel mattress overlay was finally delivered last week. The left gluteal ulcer has deteriorated somewhat. There is slough on all of the open surfaces and the total area of the wound is larger. It has a funky odor to it today. 06/13/2022: The gluteal wound is smaller and has substantially less slough. Epithelium is filling in around the margins. The odor that was present at his last visit has dissipated. 07/03/2022: The gluteal wound is a little bit smaller again today. It seems more superficial and just has a light layer of slough on the surface. 08/07/2022: No significant change to the gluteal wound. The patient's sister does report that it has bled less and seems  less friable. 09/05/2022: The gluteal wound looks a little bit better, with smaller dimensions and less friable tissue. He has had some breakdown on his perineum, adjacent to his scrotum. It appears similar in nature to the gluteal wound. 10/03/2022: The wounds all look a little bit more superficial today. They are less friable. He does have a lot of scaly dry skin around each of the wound areas. There is slough on the wound surfaces. 11/11/2022: The wound dimensions are basically unchanged. The perineal ulcer looks a little bit deeper. He has scaly shaggy skin hanging from all of the wound surfaces along with some slough. 12/11/2022: All of the open areas have converged and appear deeper today. There is slough on the surfaces along with some hypertrophic granulation tissue. 01/08/2023: There has been some epithelialization between the wound sites so we are back to a patchwork of open areas. There is some slough accumulation on the surfaces with a little bit of eschar. No malodor or purulent drainage. 02/05/2023: The wound area measured larger today. The patient's sister thinks that the Medihoney is keeping things too wet and contributed to some moisture- related breakdown. There is slough on all of the open wound surfaces. Electronic Signature(s) Signed: 02/05/2023 11:30:04 AM By: Duanne Guess MD FACS Entered By: Duanne Guess on 02/05/2023 11:30:04 [name] ([id number]) [id number] Page 3 of 9 -------------------------------------------------------------------------------- Physical Exam Details Patient Name: Date of Service: [name] 02/05/2023 11:00 A M Medical Record Number: [id number] Patient Account Number: [id number] Date of Birth/Sex: Treating RN: [name] (72 y.o. M) Primary Care Provider: Caffie Damme Other Clinician: Referring Provider: Treating Provider/Extender: Murvin Natal Weeks in Treatment:  48 Constitutional Slightly hypertensive. Tachycardic, asymptomatic. . . no acute distress. Respiratory Normal work of breathing on room air. Notes 02/05/2023: The wound area measured larger today. The patient's sister thinks that the Medihoney is keeping things too wet and contributed to some moisture- related breakdown. There is slough on all of the open wound surfaces. Electronic Signature(s) Signed: 02/05/2023 11:31:50 AM By: Duanne Guess MD FACS Entered By: Duanne Guess on 02/05/2023 11:31:50 --------------------------------------------------------------------------------

## 2023-02-09 NOTE — Progress Notes (Signed)
[name] ([id number]) [id number] Page 1 of 7 Visit Report for 02/05/2023 Arrival Information Details Patient Name: Date of Service: [name] 02/05/2023 11:00 A M Medical Record Number: [id number] Patient Account Number: [id number] Date of Birth/Sex: Treating RN: [birth year] (72 y.o. [name] Primary Care Murdis Flitton: Caffie Damme Other Clinician: Referring Sherry Blackard: Treating Zhania Shaheen/Extender: Haze Rushing in Treatment: 77 Visit Information History Since Last Visit Added or deleted any medications: No Patient Arrived: Stretcher Any new allergies or adverse reactions: No Arrival Time: 10:32 Had a fall or experienced change in No Accompanied By: sister activities of daily living that may affect Transfer Assistance: Stretcher risk of falls: Patient Identification Verified: Yes Signs or symptoms of abuse/neglect since last visito No Secondary Verification Process Completed: Yes Hospitalized since last visit: No Patient Requires Transmission-Based Precautions: No Implantable device outside of the clinic excluding No Patient Has Alerts: No cellular tissue based products placed in the center since last visit: Has Dressing in Place as Prescribed: Yes Pain Present Now: Yes Electronic Signature(s) Signed: 02/05/2023 3:34:33 PM By: Samuella Bruin Entered By: Samuella Bruin on 02/05/2023 10:32:57 -------------------------------------------------------------------------------- Encounter Discharge Information Details Patient Name: Date of Service: [name]. 02/05/2023 11:00 A M Medical Record Number: [id number] Patient Account Number: [id number] Date of Birth/Sex: Treating RN: 10/24/1950 (72 y.o. [name] Primary Care Haseeb Fiallos: Caffie Damme Other Clinician: Referring Toben Acuna: Treating Trentyn Boisclair/Extender: Haze Rushing in Treatment: 57 Encounter Discharge Information  Items Post Procedure Vitals Discharge Condition: Stable Temperature (F): 97.5 Ambulatory Status: Stretcher Pulse (bpm): 112 Discharge Destination: Home Respiratory Rate (breaths/min): 18 Transportation: Ambulance Blood Pressure (mmHg): 135/103 Accompanied By: sister Schedule Follow-up Appointment: Yes Clinical Summary of Care: Patient Declined Electronic Signature(s) Signed: 02/05/2023 3:34:33 PM By: Samuella Bruin Entered By: Samuella Bruin on 02/05/2023 11:01:40 [name] ([id number]) [phone].pdf Page 2 of 7 -------------------------------------------------------------------------------- Lower Extremity Assessment Details Patient Name: Date of Service: [name] 02/05/2023 11:00 A M Medical Record Number: [id number] Patient Account Number: [id number] Date of Birth/Sex: Treating RN: 07-22-1950 (72 y.o. [name] Primary Care Ashiah Karpowicz: Caffie Damme Other Clinician: Referring Tieshia Rettinger: Treating Leandria Thier/Extender: Murvin Natal Weeks in Treatment: 46 Electronic Signature(s) Signed: 02/05/2023 3:34:33 PM By: Samuella Bruin Entered By: Samuella Bruin on 02/05/2023 10:33:22 -------------------------------------------------------------------------------- Multi Wound Chart Details Patient Name: Date of Service: [name]. 02/05/2023 11:00 A M Medical Record Number: [id number] Patient Account Number: [id number] Date of Birth/Sex: Treating RN: 1950-06-19 (72 y.o. M) Primary Care Amarra Sawyer: Caffie Damme Other Clinician: Referring Press Casale: Treating Lena Fieldhouse/Extender: Haze Rushing in Treatment: 59 Vital Signs Height(in): 77 Pulse(bpm): 112 Weight(lbs): 318 Blood Pressure(mmHg): 135/103 Body Mass Index(BMI): 37.7 Temperature(F): 97.5 Respiratory Rate(breaths/min): 18 [3:Photos:] [N/A:N/A] Left Gluteus N/A N/A Wound Location: Gradually Appeared N/A N/A Wounding  Event: Pressure Ulcer N/A N/A Primary Etiology: Congestive Heart Failure, N/A N/A Comorbid History: Hypertension, Type I Diabetes, Neuropathy, Paraplegia 05/14/2021 N/A N/A Date Acquired: 93 N/A N/A Weeks of Treatment: Open N/A N/A Wound Status: No N/A N/A Wound Recurrence: 17.2x21x0.1 N/A N/A Measurements L x W x D (cm) 283.686 N/A N/A A (cm) : rea 28.369 N/A N/A Volume (cm) : -70.00% N/A N/A % Reduction in A rea: -70.00% N/A N/A % Reduction in Volume: Category/Stage III N/A N/A Classification: Medium N/A N/A Exudate A mount: Serosanguineous N/A N/A Exudate Type: red, brown N/A N/A Exudate Color: Distinct, outline attached N/A N/A Wound Margin: Large (67-100%) N/A N/A Granulation A mount:  Red, Hyper-granulation, Friable N/A N/A Granulation QualityNEWTON, [name] ([id number]) [id number] Page 3 of 7 Small (1-33%) N/A N/A Necrotic Amount: Fat Layer (Subcutaneous Tissue): Yes N/A N/A Exposed Structures: Fascia: No Tendon: No Muscle: No Joint: No Bone: No Small (1-33%) N/A N/A Epithelialization: Debridement - Selective/Open Wound N/A N/A Debridement: Pre-procedure Verification/Time Out 10:51 N/A N/A Taken: Lidocaine 4% Topical Solution N/A N/A Pain Control: Slough N/A N/A Tissue Debrided: Non-Viable Tissue N/A N/A Level: 85.06 N/A N/A Debridement A (sq cm): rea Curette N/A N/A Instrument: Minimum N/A N/A Bleeding: Pressure N/A N/A Hemostasis A chieved: Procedure was tolerated well N/A N/A Debridement Treatment Response: 17.2x21x0.1 N/A N/A Post Debridement Measurements L x W x D (cm) 28.369 N/A N/A Post Debridement Volume: (cm) Category/Stage III N/A N/A Post Debridement Stage: Scarring: Yes N/A N/A Periwound Skin Texture: Excoriation: No Induration: No Callus: No Crepitus: No Rash: No Dry/Scaly: Yes N/A N/A Periwound Skin Moisture: Maceration: No Atrophie Blanche: No N/A N/A Periwound Skin  Color: Cyanosis: No Ecchymosis: No Erythema: No Hemosiderin Staining: No Mottled: No Pallor: No Rubor: No No Abnormality N/A N/A Temperature: Yes N/A N/A Tenderness on Palpation: Debridement N/A N/A Procedures Performed: Treatment Notes Wound #3 (Gluteus) Wound Laterality: Left Cleanser Soap and Water Discharge Instruction: May shower and wash wound with dial antibacterial soap and water prior to dressing change. Wound Cleanser Discharge Instruction: Cleanse the wound with wound cleanser prior to applying a clean dressing using gauze sponges, not tissue or cotton balls. Peri-Wound Care Topical Primary Dressing Maxorb Extra Ag+ Alginate Dressing, 4x4.75 (in/in) Discharge Instruction: Apply to wound bed as instructed Secondary Dressing ABD Pad, 8x10 Discharge Instruction: Apply over primary dressing as directed. Secured With 66M Medipore H Soft Cloth Surgical T ape, 4 x 10 (in/yd) Discharge Instruction: Secure with tape as directed. Compression Wrap Compression Stockings Add-Ons Electronic Signature(s) Signed: 02/05/2023 11:29:06 AM By: Duanne Guess MD FACS Entered By: Duanne Guess on 02/05/2023 11:29:06 [name] ([id number]) [id number] Page 4 of 7 -------------------------------------------------------------------------------- Multi-Disciplinary Care Plan Details Patient Name: Date of Service: [name] 02/05/2023 11:00 A M Medical Record Number: [id number] Patient Account Number: [id number] Date of Birth/Sex: Treating RN: [birth year] (72 y.o. [name] Primary Care Kennth Vanbenschoten: Caffie Damme Other Clinician: Referring Leyland Kenna: Treating Manan Olmo/Extender: Haze Rushing in Treatment: 89 Multidisciplinary Care Plan reviewed with physician Active Inactive Nutrition Nursing Diagnoses: Potential for alteratiion in Nutrition/Potential for imbalanced nutrition Goals: Patient/caregiver verbalizes  understanding of need to maintain therapeutic glucose control per primary care physician Date Initiated: 03/19/2022 Target Resolution Date: 03/10/2023 Goal Status: Active Interventions: Provide education on elevated blood sugars and impact on wound healing Treatment Activities: Dietary management education, guidance and counseling : 03/19/2022 Notes: Wound/Skin Impairment Nursing Diagnoses: Knowledge deficit related to ulceration/compromised skin integrity Goals: Ulcer/skin breakdown will have a volume reduction of 30% by week 4 Date Initiated: 03/19/2022 Target Resolution Date: 03/10/2023 Goal Status: Active Interventions: Assess ulceration(s) every visit Provide education on ulcer and skin care Treatment Activities: Skin care regimen initiated : 03/19/2022 Notes: Electronic Signature(s) Signed: 02/09/2023 2:03:22 PM By: Brenton Grills Entered By: Brenton Grills on 02/05/2023 10:45:27 -------------------------------------------------------------------------------- Pain Assessment Details Patient Name: Date of Service: [name]. 02/05/2023 11:00 A M Medical Record Number: [id number] Patient Account Number: [id number] Date of Birth/Sex: Treating RN: [birth year] (72 y.o. [name] Primary Care Vegas Fritze: Caffie Damme Other Clinician: Referring Nena Hampe: Treating Anwar Crill/Extender: Murvin Natal [place], Madolyn Frieze ([id number]) [id number] Page 5 of 7 Weeks in Treatment:  46 Active Problems Location of Pain Severity and Description of Pain Patient Has Paino Yes Site Locations Pain Location: Pain in Ulcers Duration of the Pain. Constant / Intermittento Constant Rate the pain. Current Pain Level: 10 Character of Pain Describe the Pain: Burning Pain Management and Medication Current Pain Management: Medication: Yes Electronic Signature(s) Signed: 02/05/2023 3:34:33 PM By: Samuella Bruin Entered By: Samuella Bruin  on 02/05/2023 10:33:20 -------------------------------------------------------------------------------- Patient/Caregiver Education Details Patient Name: Date of Service: [name] 9/26/2024andnbsp11:00 A M Medical Record Number: [id number] Patient Account Number: [id number] Date of Birth/Gender: Treating RN: Aug 15, 1950 (71 y.o. [name] Primary Care Physician: Caffie Damme Other Clinician: Referring Physician: Treating Physician/Extender: Haze Rushing in Treatment: 60 Education Assessment Education Provided To: Patient Education Topics Provided Wound/Skin Impairment: Methods: Explain/Verbal Responses: Reinforcements needed, State content correctly Electronic Signature(s) Signed: 02/05/2023 3:34:33 PM By: Samuella Bruin Entered By: Samuella Bruin on 02/05/2023 11:00:59 [name] ([id number]) [id number] Page 6 of 7 -------------------------------------------------------------------------------- Wound Assessment Details Patient Name: Date of Service: [name] 02/05/2023 11:00 A M Medical Record Number: [id number] Patient Account Number: [id number] Date of Birth/Sex: Treating RN: 09-21-1950 (72 y.o. [name] Primary Care Keats Kingry: Caffie Damme Other Clinician: Referring Macklen Wilhoite: Treating Janiesha Diehl/Extender: Murvin Natal Weeks in Treatment: 46 Wound Status Wound Number: 3 Primary Pressure Ulcer Etiology: Wound Location: Left Gluteus Wound Open Wounding Event: Gradually Appeared Status: Date Acquired: 05/14/2021 Comorbid Congestive Heart Failure, Hypertension, Type I Diabetes, Weeks Of Treatment: 46 History: Neuropathy, Paraplegia Clustered Wound: No Photos Wound Measurements Length: (cm) 17.2 Width: (cm) 21 Depth: (cm) 0.1 Area: (cm) 283.686 Volume: (cm) 28.369 % Reduction in Area: -70% % Reduction in Volume: -70% Epithelialization: Small (1-33%) Wound  Description Classification: Category/Stage III Wound Margin: Distinct, outline attached Exudate Amount: Medium Exudate Type: Serosanguineous Exudate Color: red, brown Foul Odor After Cleansing: No Slough/Fibrino Yes Wound Bed Granulation Amount: Large (67-100%) Exposed Structure Granulation Quality: Red, Hyper-granulation, Friable Fascia Exposed: No Necrotic Amount: Small (1-33%) Fat Layer (Subcutaneous Tissue) Exposed: Yes Necrotic Quality: Adherent Slough Tendon Exposed: No Muscle Exposed: No Joint Exposed: No Bone Exposed: No Periwound Skin Texture Texture Color No Abnormalities Noted: No No Abnormalities Noted: Yes Callus: No Temperature / Pain Crepitus: No Temperature: No Abnormality Excoriation: No Tenderness on Palpation: Yes Induration: No Rash: No Scarring: Yes Moisture No Abnormalities Noted: No Dry / Scaly: Yes Maceration: No Bruce, [name] ([id number]) [id number] Page 7 of 7 Treatment Notes Wound #3 (Gluteus) Wound Laterality: Left Cleanser Soap and Water Discharge Instruction: May shower and wash wound with dial antibacterial soap and water prior to dressing change. Wound Cleanser Discharge Instruction: Cleanse the wound with wound cleanser prior to applying a clean dressing using gauze sponges, not tissue or cotton balls. Peri-Wound Care Topical Primary Dressing Maxorb Extra Ag+ Alginate Dressing, 4x4.75 (in/in) Discharge Instruction: Apply to wound bed as instructed Secondary Dressing ABD Pad, 8x10 Discharge Instruction: Apply over primary dressing as directed. Secured With 40M Medipore H Soft Cloth Surgical T ape, 4 x 10 (in/yd) Discharge Instruction: Secure with tape as directed. Compression Wrap Compression Stockings Add-Ons Electronic Signature(s) Signed: 02/09/2023 2:03:22 PM By: Brenton Grills Entered By: Brenton Grills on 02/05/2023  10:46:32 -------------------------------------------------------------------------------- Vitals Details Patient Name: Date of Service: [name]. 02/05/2023 11:00 A M Medical Record Number: [id number] Patient Account Number: [id number] Date of Birth/Sex: Treating RN: [birth year] (72 y.o. [name] Primary Care [place] Freyre: Caffie Damme Other Clinician: Referring Cassie Shedlock: Treating Victor Granados/Extender: Duanne Guess  Caffie Damme Weeks in Treatment: 46 Vital Signs Time Taken: 10:32 Temperature (F): 97.5 Height (in): 77 Pulse (bpm): 112 Weight (lbs): 318 Respiratory Rate (breaths/min): 18 Body Mass Index (BMI): 37.7 Blood Pressure (mmHg): 135/103 Reference Range: 80 - 120 mg / dl Electronic Signature(s) Signed: 02/05/2023 3:34:33 PM By: Samuella Bruin Entered By: Samuella Bruin on 02/05/2023 10:33:11

## 2023-02-16 ENCOUNTER — Inpatient Hospital Stay (HOSPITAL_COMMUNITY): Payer: Medicare Other

## 2023-02-16 ENCOUNTER — Inpatient Hospital Stay (HOSPITAL_COMMUNITY)
Admission: EM | Admit: 2023-02-16 | Discharge: 2023-02-27 | DRG: 291 | Disposition: A | Discharge status: Home / Self Care | Payer: Medicare Other | Attending: Internal Medicine | Admitting: Internal Medicine

## 2023-02-16 ENCOUNTER — Other Ambulatory Visit: Payer: Self-pay

## 2023-02-16 ENCOUNTER — Emergency Department (HOSPITAL_COMMUNITY): Payer: Medicare Other

## 2023-02-16 ENCOUNTER — Encounter (HOSPITAL_COMMUNITY): Payer: Self-pay

## 2023-02-16 DIAGNOSIS — L8990 Pressure ulcer of unspecified site, unspecified stage: Secondary | ICD-10-CM | POA: Diagnosis present

## 2023-02-16 DIAGNOSIS — I110 Hypertensive heart disease with heart failure: Principal | ICD-10-CM | POA: Diagnosis present

## 2023-02-16 DIAGNOSIS — E785 Hyperlipidemia, unspecified: Secondary | ICD-10-CM | POA: Diagnosis present

## 2023-02-16 DIAGNOSIS — E1142 Type 2 diabetes mellitus with diabetic polyneuropathy: Secondary | ICD-10-CM | POA: Diagnosis present

## 2023-02-16 DIAGNOSIS — Z8672 Personal history of thrombophlebitis: Secondary | ICD-10-CM

## 2023-02-16 DIAGNOSIS — Z821 Family history of blindness and visual loss: Secondary | ICD-10-CM

## 2023-02-16 DIAGNOSIS — E1151 Type 2 diabetes mellitus with diabetic peripheral angiopathy without gangrene: Secondary | ICD-10-CM | POA: Diagnosis present

## 2023-02-16 DIAGNOSIS — I10 Essential (primary) hypertension: Secondary | ICD-10-CM | POA: Diagnosis not present

## 2023-02-16 DIAGNOSIS — Z803 Family history of malignant neoplasm of breast: Secondary | ICD-10-CM

## 2023-02-16 DIAGNOSIS — Z933 Colostomy status: Secondary | ICD-10-CM

## 2023-02-16 DIAGNOSIS — Z7984 Long term (current) use of oral hypoglycemic drugs: Secondary | ICD-10-CM

## 2023-02-16 DIAGNOSIS — G4733 Obstructive sleep apnea (adult) (pediatric): Secondary | ICD-10-CM | POA: Diagnosis present

## 2023-02-16 DIAGNOSIS — E876 Hypokalemia: Secondary | ICD-10-CM | POA: Diagnosis not present

## 2023-02-16 DIAGNOSIS — I4891 Unspecified atrial fibrillation: Secondary | ICD-10-CM | POA: Diagnosis not present

## 2023-02-16 DIAGNOSIS — J9602 Acute respiratory failure with hypercapnia: Secondary | ICD-10-CM | POA: Diagnosis present

## 2023-02-16 DIAGNOSIS — I5031 Acute diastolic (congestive) heart failure: Secondary | ICD-10-CM

## 2023-02-16 DIAGNOSIS — Z79899 Other long term (current) drug therapy: Secondary | ICD-10-CM

## 2023-02-16 DIAGNOSIS — Z8249 Family history of ischemic heart disease and other diseases of the circulatory system: Secondary | ICD-10-CM

## 2023-02-16 DIAGNOSIS — Z8701 Personal history of pneumonia (recurrent): Secondary | ICD-10-CM

## 2023-02-16 DIAGNOSIS — E66813 Obesity, class 3: Secondary | ICD-10-CM | POA: Diagnosis present

## 2023-02-16 DIAGNOSIS — Q2112 Patent foramen ovale: Secondary | ICD-10-CM

## 2023-02-16 DIAGNOSIS — I5033 Acute on chronic diastolic (congestive) heart failure: Secondary | ICD-10-CM | POA: Diagnosis present

## 2023-02-16 DIAGNOSIS — Z83438 Family history of other disorder of lipoprotein metabolism and other lipidemia: Secondary | ICD-10-CM

## 2023-02-16 DIAGNOSIS — J9601 Acute respiratory failure with hypoxia: Secondary | ICD-10-CM

## 2023-02-16 DIAGNOSIS — I509 Heart failure, unspecified: Principal | ICD-10-CM

## 2023-02-16 DIAGNOSIS — I4821 Permanent atrial fibrillation: Secondary | ICD-10-CM | POA: Diagnosis present

## 2023-02-16 DIAGNOSIS — M1990 Unspecified osteoarthritis, unspecified site: Secondary | ICD-10-CM | POA: Diagnosis present

## 2023-02-16 DIAGNOSIS — Z87891 Personal history of nicotine dependence: Secondary | ICD-10-CM

## 2023-02-16 DIAGNOSIS — Z7401 Bed confinement status: Secondary | ICD-10-CM

## 2023-02-16 DIAGNOSIS — I480 Paroxysmal atrial fibrillation: Secondary | ICD-10-CM | POA: Diagnosis present

## 2023-02-16 DIAGNOSIS — Z87440 Personal history of urinary (tract) infections: Secondary | ICD-10-CM

## 2023-02-16 DIAGNOSIS — G40A09 Absence epileptic syndrome, not intractable, without status epilepticus: Secondary | ICD-10-CM | POA: Diagnosis present

## 2023-02-16 DIAGNOSIS — L409 Psoriasis, unspecified: Secondary | ICD-10-CM | POA: Diagnosis present

## 2023-02-16 DIAGNOSIS — R569 Unspecified convulsions: Secondary | ICD-10-CM

## 2023-02-16 DIAGNOSIS — Z6838 Body mass index (BMI) 38.0-38.9, adult: Secondary | ICD-10-CM

## 2023-02-16 DIAGNOSIS — E119 Type 2 diabetes mellitus without complications: Secondary | ICD-10-CM

## 2023-02-16 DIAGNOSIS — M79606 Pain in leg, unspecified: Secondary | ICD-10-CM

## 2023-02-16 DIAGNOSIS — Z833 Family history of diabetes mellitus: Secondary | ICD-10-CM

## 2023-02-16 DIAGNOSIS — L89153 Pressure ulcer of sacral region, stage 3: Secondary | ICD-10-CM | POA: Diagnosis present

## 2023-02-16 DIAGNOSIS — Z932 Ileostomy status: Secondary | ICD-10-CM | POA: Diagnosis not present

## 2023-02-16 DIAGNOSIS — Z841 Family history of disorders of kidney and ureter: Secondary | ICD-10-CM

## 2023-02-16 DIAGNOSIS — G894 Chronic pain syndrome: Secondary | ICD-10-CM | POA: Diagnosis present

## 2023-02-16 DIAGNOSIS — Z86718 Personal history of other venous thrombosis and embolism: Secondary | ICD-10-CM

## 2023-02-16 DIAGNOSIS — E669 Obesity, unspecified: Secondary | ICD-10-CM | POA: Diagnosis present

## 2023-02-16 DIAGNOSIS — G629 Polyneuropathy, unspecified: Secondary | ICD-10-CM

## 2023-02-16 DIAGNOSIS — Z880 Allergy status to penicillin: Secondary | ICD-10-CM

## 2023-02-16 DIAGNOSIS — E7800 Pure hypercholesterolemia, unspecified: Secondary | ICD-10-CM | POA: Diagnosis not present

## 2023-02-16 DIAGNOSIS — Z7902 Long term (current) use of antithrombotics/antiplatelets: Secondary | ICD-10-CM

## 2023-02-16 DIAGNOSIS — N400 Enlarged prostate without lower urinary tract symptoms: Secondary | ICD-10-CM | POA: Diagnosis present

## 2023-02-16 DIAGNOSIS — I878 Other specified disorders of veins: Secondary | ICD-10-CM | POA: Diagnosis present

## 2023-02-16 DIAGNOSIS — E1165 Type 2 diabetes mellitus with hyperglycemia: Secondary | ICD-10-CM | POA: Diagnosis present

## 2023-02-16 DIAGNOSIS — Z9049 Acquired absence of other specified parts of digestive tract: Secondary | ICD-10-CM

## 2023-02-16 LAB — COMPREHENSIVE METABOLIC PANEL
ALT: 13 U/L (ref 0–44)
AST: 13 U/L — ABNORMAL LOW (ref 15–41)
Albumin: 3.3 g/dL — ABNORMAL LOW (ref 3.5–5.0)
Alkaline Phosphatase: 36 U/L — ABNORMAL LOW (ref 38–126)
Anion gap: 7 (ref 5–15)
BUN: 17 mg/dL (ref 8–23)
CO2: 31 mmol/L (ref 22–32)
Calcium: 8.5 mg/dL — ABNORMAL LOW (ref 8.9–10.3)
Chloride: 96 mmol/L — ABNORMAL LOW (ref 98–111)
Creatinine, Ser: 0.65 mg/dL (ref 0.61–1.24)
GFR, Estimated: >60 mL/min (ref >60)
Glucose, Bld: 189 mg/dL — ABNORMAL HIGH (ref 70–99)
Potassium: 5.1 mmol/L (ref 3.5–5.1)
Sodium: 134 mmol/L — ABNORMAL LOW (ref 135–145)
Total Bilirubin: 0.6 mg/dL (ref 0.3–1.2)
Total Protein: 7.5 g/dL (ref 6.5–8.1)

## 2023-02-16 LAB — CBC WITH DIFFERENTIAL/PLATELET
Abs Immature Granulocytes: 0.04 10*3/uL (ref 0.00–0.07)
Basophils Absolute: 0 10*3/uL (ref 0.0–0.1)
Basophils Relative: 0 %
Eosinophils Absolute: 0.3 10*3/uL (ref 0.0–0.5)
Eosinophils Relative: 3 %
HCT: 42.5 % (ref 39.0–52.0)
Hemoglobin: 12.3 g/dL — ABNORMAL LOW (ref 13.0–17.0)
Immature Granulocytes: 1 %
Lymphocytes Relative: 10 %
Lymphs Abs: 0.8 10*3/uL (ref 0.7–4.0)
MCH: 27.5 pg (ref 26.0–34.0)
MCHC: 28.9 g/dL — ABNORMAL LOW (ref 30.0–36.0)
MCV: 95.1 fL (ref 80.0–100.0)
Monocytes Absolute: 0.6 10*3/uL (ref 0.1–1.0)
Monocytes Relative: 7 %
Neutro Abs: 6.5 10*3/uL (ref 1.7–7.7)
Neutrophils Relative %: 79 %
Platelets: 281 10*3/uL (ref 150–400)
RBC: 4.47 MIL/uL (ref 4.22–5.81)
RDW: 16.2 % — ABNORMAL HIGH (ref 11.5–15.5)
WBC: 8.2 10*3/uL (ref 4.0–10.5)
nRBC: 0.2 % (ref 0.0–0.2)

## 2023-02-16 LAB — URINALYSIS, W/ REFLEX TO CULTURE (INFECTION SUSPECTED)
Bilirubin Urine: NEGATIVE
Glucose, UA: NEGATIVE mg/dL
Hgb urine dipstick: NEGATIVE
Ketones, ur: NEGATIVE mg/dL
Nitrite: NEGATIVE
Protein, ur: NEGATIVE mg/dL
Specific Gravity, Urine: 1.013 (ref 1.005–1.030)
WBC, UA: >50 WBC/hpf (ref 0–5)
pH: 5 (ref 5.0–8.0)

## 2023-02-16 LAB — BASIC METABOLIC PANEL
Anion gap: 7 (ref 5–15)
BUN: 16 mg/dL (ref 8–23)
CO2: 34 mmol/L — ABNORMAL HIGH (ref 22–32)
Calcium: 8.5 mg/dL — ABNORMAL LOW (ref 8.9–10.3)
Chloride: 93 mmol/L — ABNORMAL LOW (ref 98–111)
Creatinine, Ser: 0.57 mg/dL — ABNORMAL LOW (ref 0.61–1.24)
GFR, Estimated: >60 mL/min (ref >60)
Glucose, Bld: 202 mg/dL — ABNORMAL HIGH (ref 70–99)
Potassium: 4.7 mmol/L (ref 3.5–5.1)
Sodium: 134 mmol/L — ABNORMAL LOW (ref 135–145)

## 2023-02-16 LAB — HEMOGLOBIN A1C
Hgb A1c MFr Bld: 8 % — ABNORMAL HIGH (ref 4.8–5.6)
Mean Plasma Glucose: 182.9 mg/dL

## 2023-02-16 LAB — ECHOCARDIOGRAM COMPLETE
Height: 77 in
Weight: 5200 [oz_av]

## 2023-02-16 LAB — VAS US ABI WITH/WO TBI
Left ABI: 1.17
Right ABI: 1.36

## 2023-02-16 LAB — BLOOD GAS, VENOUS
Acid-Base Excess: 7.7 mmol/L — ABNORMAL HIGH (ref 0.0–2.0)
Acid-Base Excess: 10.8 mmol/L — ABNORMAL HIGH (ref 0.0–2.0)
Bicarbonate: 36.8 mmol/L — ABNORMAL HIGH (ref 20.0–28.0)
Bicarbonate: 39 mmol/L — ABNORMAL HIGH (ref 20.0–28.0)
O2 Saturation: 86.5 %
O2 Saturation: 47.2 %
Patient temperature: 37
Patient temperature: 37
pCO2, Ven: 73 mm[Hg] (ref 44–60)
pCO2, Ven: 69 mm[Hg] — ABNORMAL HIGH (ref 44–60)
pH, Ven: 7.31 (ref 7.25–7.43)
pH, Ven: 7.36 (ref 7.25–7.43)
pO2, Ven: 56 mm[Hg] — ABNORMAL HIGH (ref 32–45)
pO2, Ven: <31 mm[Hg] — CL (ref 32–45)

## 2023-02-16 LAB — BRAIN NATRIURETIC PEPTIDE: B Natriuretic Peptide: 198.7 pg/mL — ABNORMAL HIGH (ref 0.0–100.0)

## 2023-02-16 LAB — GLUCOSE, CAPILLARY
Glucose-Capillary: 174 mg/dL — ABNORMAL HIGH (ref 70–99)
Glucose-Capillary: 157 mg/dL — ABNORMAL HIGH (ref 70–99)
Glucose-Capillary: 115 mg/dL — ABNORMAL HIGH (ref 70–99)

## 2023-02-16 LAB — PHOSPHORUS: Phosphorus: 2.4 mg/dL — ABNORMAL LOW (ref 2.5–4.6)

## 2023-02-16 LAB — PROTIME-INR
INR: 1.3 — ABNORMAL HIGH (ref 0.8–1.2)
Prothrombin Time: 16.5 s — ABNORMAL HIGH (ref 11.4–15.2)

## 2023-02-16 LAB — I-STAT CG4 LACTIC ACID, ED
Lactic Acid, Venous: 1.1 mmol/L (ref 0.5–1.9)
Lactic Acid, Venous: 0.8 mmol/L (ref 0.5–1.9)

## 2023-02-16 LAB — MAGNESIUM: Magnesium: 1.9 mg/dL (ref 1.7–2.4)

## 2023-02-16 LAB — CBG MONITORING, ED: Glucose-Capillary: 187 mg/dL — ABNORMAL HIGH (ref 70–99)

## 2023-02-16 LAB — APTT: aPTT: 67 s — ABNORMAL HIGH (ref 24–36)

## 2023-02-16 LAB — MRSA NEXT GEN BY PCR, NASAL: MRSA by PCR Next Gen: NOT DETECTED

## 2023-02-16 MED ORDER — INSULIN ASPART 100 UNIT/ML IJ SOLN
0.0000 [IU] | Freq: Three times a day (TID) | INTRAMUSCULAR | Status: DC
  Administered 2023-02-16 – 2023-02-17 (×4): 4 [IU] via SUBCUTANEOUS
  Administered 2023-02-17 – 2023-02-18 (×3): 3 [IU] via SUBCUTANEOUS
  Administered 2023-02-18: 4 [IU] via SUBCUTANEOUS
  Administered 2023-02-19: 7 [IU] via SUBCUTANEOUS
  Administered 2023-02-19: 4 [IU] via SUBCUTANEOUS
  Administered 2023-02-19 – 2023-02-20 (×2): 3 [IU] via SUBCUTANEOUS
  Administered 2023-02-20 – 2023-02-21 (×3): 4 [IU] via SUBCUTANEOUS
  Administered 2023-02-21: 7 [IU] via SUBCUTANEOUS
  Administered 2023-02-21 – 2023-02-22 (×2): 4 [IU] via SUBCUTANEOUS
  Administered 2023-02-22: 11 [IU] via SUBCUTANEOUS
  Administered 2023-02-22: 7 [IU] via SUBCUTANEOUS
  Administered 2023-02-23: 11 [IU] via SUBCUTANEOUS
  Administered 2023-02-23: 7 [IU] via SUBCUTANEOUS
  Administered 2023-02-23: 4 [IU] via SUBCUTANEOUS
  Administered 2023-02-24 (×2): 7 [IU] via SUBCUTANEOUS
  Administered 2023-02-24: 4 [IU] via SUBCUTANEOUS
  Administered 2023-02-25: 7 [IU] via SUBCUTANEOUS
  Administered 2023-02-25: 4 [IU] via SUBCUTANEOUS
  Administered 2023-02-25: 7 [IU] via SUBCUTANEOUS
  Administered 2023-02-26: 11 [IU] via SUBCUTANEOUS
  Administered 2023-02-26: 2 [IU] via SUBCUTANEOUS
  Administered 2023-02-26: 7 [IU] via SUBCUTANEOUS
  Administered 2023-02-27: 11 [IU] via SUBCUTANEOUS
  Administered 2023-02-27: 4 [IU] via SUBCUTANEOUS
  Filled 2023-02-16: qty 0.2

## 2023-02-16 MED ORDER — EMPAGLIFLOZIN 10 MG PO TABS: 10.0000 mg | ORAL_TABLET | Freq: Every day | ORAL | Status: DC

## 2023-02-16 MED ORDER — FENOFIBRATE 160 MG PO TABS
160.0000 mg | ORAL_TABLET | Freq: Every day | ORAL | Status: DC
  Administered 2023-02-16 – 2023-02-27 (×12): 160 mg via ORAL
  Filled 2023-02-16 (×13): qty 1

## 2023-02-16 MED ORDER — HYDROCERIN EX CREA
TOPICAL_CREAM | Freq: Two times a day (BID) | CUTANEOUS | Status: DC
  Administered 2023-02-17 – 2023-02-25 (×2): 1 via TOPICAL
  Filled 2023-02-16 (×2): qty 113

## 2023-02-16 MED ORDER — ADULT MULTIVITAMIN W/MINERALS CH
1.0000 | ORAL_TABLET | Freq: Every day | ORAL | Status: DC
  Administered 2023-02-16 – 2023-02-27 (×12): 1 via ORAL
  Filled 2023-02-16 (×13): qty 1

## 2023-02-16 MED ORDER — ORAL CARE MOUTH RINSE: 15.0000 mL | OROMUCOSAL | Status: DC | PRN

## 2023-02-16 MED ORDER — ACETAMINOPHEN 325 MG PO TABS
650.0000 mg | ORAL_TABLET | Freq: Once | ORAL | Status: AC
  Administered 2023-02-16: 650 mg via ORAL
  Filled 2023-02-16: qty 2

## 2023-02-16 MED ORDER — DILTIAZEM HCL ER COATED BEADS 300 MG PO CP24
300.0000 mg | ORAL_CAPSULE | Freq: Every day | ORAL | Status: DC
  Administered 2023-02-16 – 2023-02-23 (×8): 300 mg via ORAL
  Filled 2023-02-16 (×8): qty 1

## 2023-02-16 MED ORDER — FUROSEMIDE 10 MG/ML IJ SOLN
80.0000 mg | Freq: Two times a day (BID) | INTRAMUSCULAR | Status: DC
  Administered 2023-02-16 – 2023-02-18 (×4): 80 mg via INTRAVENOUS
  Filled 2023-02-16 (×4): qty 8

## 2023-02-16 MED ORDER — FENTANYL CITRATE PF 50 MCG/ML IJ SOSY
50.0000 ug | PREFILLED_SYRINGE | Freq: Once | INTRAMUSCULAR | Status: AC
  Administered 2023-02-16: 50 ug via INTRAVENOUS
  Filled 2023-02-16: qty 1

## 2023-02-16 MED ORDER — FUROSEMIDE 10 MG/ML IJ SOLN
40.0000 mg | Freq: Once | INTRAMUSCULAR | Status: AC
  Administered 2023-02-16: 40 mg via INTRAVENOUS
  Filled 2023-02-16: qty 4

## 2023-02-16 MED ORDER — LAMOTRIGINE 25 MG PO TABS
150.0000 mg | ORAL_TABLET | Freq: Two times a day (BID) | ORAL | Status: DC
  Administered 2023-02-16 – 2023-02-27 (×23): 150 mg via ORAL
  Filled 2023-02-16 (×23): qty 2

## 2023-02-16 MED ORDER — HYDROMORPHONE HCL 1 MG/ML IJ SOLN
1.0000 mg | INTRAMUSCULAR | Status: DC | PRN
  Administered 2023-02-16 – 2023-02-18 (×7): 1 mg via INTRAVENOUS
  Filled 2023-02-16 (×8): qty 1

## 2023-02-16 MED ORDER — OMEGA-3-ACID ETHYL ESTERS 1 G PO CAPS
1.0000 g | ORAL_CAPSULE | Freq: Two times a day (BID) | ORAL | Status: DC
  Administered 2023-02-16 – 2023-02-27 (×22): 1 g via ORAL
  Filled 2023-02-16 (×23): qty 1

## 2023-02-16 MED ORDER — CHLORHEXIDINE GLUCONATE CLOTH 2 % EX PADS
6.0000 | MEDICATED_PAD | Freq: Every day | CUTANEOUS | Status: DC
  Administered 2023-02-16 – 2023-02-17 (×2): 6 via TOPICAL

## 2023-02-16 MED ORDER — GABAPENTIN 400 MG PO CAPS
800.0000 mg | ORAL_CAPSULE | Freq: Three times a day (TID) | ORAL | Status: DC
  Administered 2023-02-16 – 2023-02-27 (×34): 800 mg via ORAL
  Filled 2023-02-16 (×35): qty 2

## 2023-02-16 MED ORDER — ORAL CARE MOUTH RINSE
15.0000 mL | OROMUCOSAL | Status: DC
  Administered 2023-02-16 – 2023-02-17 (×4): 15 mL via OROMUCOSAL

## 2023-02-16 MED ORDER — ALPHA-LIPOIC ACID 600 MG PO TABS: 1.0000 | ORAL_TABLET | Freq: Every day | ORAL | Status: DC

## 2023-02-16 MED ORDER — ACETAMINOPHEN 650 MG RE SUPP: 650.0000 mg | Freq: Four times a day (QID) | RECTAL | Status: DC | PRN

## 2023-02-16 MED ORDER — LOSARTAN POTASSIUM 50 MG PO TABS
25.0000 mg | ORAL_TABLET | Freq: Two times a day (BID) | ORAL | Status: DC
  Administered 2023-02-16 (×2): 25 mg via ORAL
  Filled 2023-02-16 (×2): qty 1

## 2023-02-16 MED ORDER — METFORMIN HCL 500 MG PO TABS
1000.0000 mg | ORAL_TABLET | Freq: Two times a day (BID) | ORAL | Status: DC
  Administered 2023-02-16 (×2): 1000 mg via ORAL
  Filled 2023-02-16 (×3): qty 2

## 2023-02-16 MED ORDER — ONDANSETRON HCL 4 MG/2ML IJ SOLN: 4.0000 mg | Freq: Four times a day (QID) | INTRAMUSCULAR | Status: DC | PRN

## 2023-02-16 MED ORDER — ACETAMINOPHEN 325 MG PO TABS
650.0000 mg | ORAL_TABLET | Freq: Four times a day (QID) | ORAL | Status: DC | PRN
  Administered 2023-02-16 – 2023-02-23 (×4): 650 mg via ORAL
  Filled 2023-02-16 (×6): qty 2

## 2023-02-16 MED ORDER — DULOXETINE HCL 30 MG PO CPEP
60.0000 mg | ORAL_CAPSULE | Freq: Every day | ORAL | Status: DC
  Administered 2023-02-16 – 2023-02-27 (×12): 60 mg via ORAL
  Filled 2023-02-16 (×13): qty 2

## 2023-02-16 MED ORDER — ATORVASTATIN CALCIUM 10 MG PO TABS
10.0000 mg | ORAL_TABLET | Freq: Every day | ORAL | Status: DC
  Administered 2023-02-16 – 2023-02-26 (×11): 10 mg via ORAL
  Filled 2023-02-16 (×11): qty 1

## 2023-02-16 MED ORDER — IOHEXOL 350 MG/ML SOLN
75.0000 mL | Freq: Once | INTRAVENOUS | Status: AC | PRN
  Administered 2023-02-16: 80 mL via INTRAVENOUS

## 2023-02-16 MED ORDER — HEPARIN (PORCINE) 25000 UT/250ML-% IV SOLN
2050.0000 [IU]/h | INTRAVENOUS | Status: AC
  Administered 2023-02-16: 1750 [IU]/h via INTRAVENOUS
  Administered 2023-02-17: 2050 [IU]/h via INTRAVENOUS
  Filled 2023-02-16 (×2): qty 250

## 2023-02-16 MED ORDER — GLIPIZIDE ER 2.5 MG PO TB24
2.5000 mg | ORAL_TABLET | Freq: Two times a day (BID) | ORAL | Status: DC
  Administered 2023-02-16: 2.5 mg via ORAL
  Filled 2023-02-16 (×2): qty 1

## 2023-02-16 MED ORDER — FUROSEMIDE 10 MG/ML IJ SOLN: 40.0000 mg | Freq: Two times a day (BID) | INTRAMUSCULAR | Status: DC

## 2023-02-16 MED ORDER — LEVETIRACETAM 500 MG PO TABS
1500.0000 mg | ORAL_TABLET | Freq: Two times a day (BID) | ORAL | Status: DC
  Administered 2023-02-16 – 2023-02-27 (×23): 1500 mg via ORAL
  Filled 2023-02-16 (×23): qty 3

## 2023-02-16 MED ORDER — ONDANSETRON HCL 4 MG PO TABS: 4.0000 mg | ORAL_TABLET | Freq: Four times a day (QID) | ORAL | Status: DC | PRN

## 2023-02-16 MED ORDER — PROCHLORPERAZINE EDISYLATE 10 MG/2ML IJ SOLN
10.0000 mg | Freq: Once | INTRAMUSCULAR | Status: AC
  Administered 2023-02-16: 10 mg via INTRAVENOUS
  Filled 2023-02-16: qty 2

## 2023-02-16 MED ORDER — DABIGATRAN ETEXILATE MESYLATE 150 MG PO CAPS
150.0000 mg | ORAL_CAPSULE | Freq: Two times a day (BID) | ORAL | Status: DC
  Administered 2023-02-16: 150 mg via ORAL
  Filled 2023-02-16: qty 1

## 2023-02-16 MED ORDER — DILTIAZEM HCL ER 240 MG PO CP24: 240.0000 mg | ORAL_CAPSULE | Freq: Every day | ORAL | Status: DC

## 2023-02-16 NOTE — Progress Notes (Signed)
PHARMACIST - PHYSICIAN ORDER COMMUNICATION  CONCERNING: P&T Medication Policy on Herbal Medications  DESCRIPTION:  This patient's order for: Alpha Lipoic Acid has been noted.  This product(s) is classified as an "herbal" or natural product. Due to a lack of definitive safety studies or FDA approval, nonstandard manufacturing practices, plus the potential risk of unknown drug-drug interactions while on inpatient medications, the Pharmacy and Therapeutics Committee does not permit the use of "herbal" or natural products of this type within [facility].   ACTION TAKEN: The pharmacy department is unable to verify this order at this time and your patient has been informed of this safety policy. Please reevaluate patient's clinical condition at discharge and address if the herbal or natural product(s) should be resumed at that time.   Greer Pickerel, PharmD, BCPS Clinical Pharmacist 02/16/2023 8:23 AM

## 2023-02-16 NOTE — Progress Notes (Signed)
*  PRELIMINARY RESULTS* Echocardiogram 2D Echocardiogram has been performed.  Laddie Aquas 02/16/2023, 1:46 PM

## 2023-02-16 NOTE — Progress Notes (Signed)
Rt took pt off BIPAP and placed on 4 LPM [place]. Pt doing well at this time.

## 2023-02-16 NOTE — ED Notes (Signed)
ED TO INPATIENT HANDOFF REPORT  Name/Age/Gender [name] 72 y.o. male  Code Status    Code Status Orders  (From admission, onward)           Start     Ordered   02/16/23 0808  Full code  Continuous       Question:  By:  Answer:  Consent: discussion documented in EHR   02/16/23 0807           Code Status History     Date Active Date Inactive Code Status Order ID Comments User Context   01/05/2022 0106 01/10/2022 1558 Full Code [id number]  Darlin Drop, DO ED   05/20/2014 2030 05/24/2014 2108 Full Code [id number]  Leslye Peer, MD Inpatient   01/30/2014 0534 02/02/2014 2104 Full Code [id number]  Eduard Clos, MD Inpatient   12/25/2013 1914 01/03/2014 1853 Full Code [id number]  Axel Filler, MD Inpatient   12/25/2013 1521 12/25/2013 1914 Full Code [id number]  Sherrie George, PA-C ED   09/05/2013 1136 12/25/2013 1521 DNR [id number]  Margit Hanks, MD Outpatient   09/01/2013 0547 09/02/2013 1641 Full Code [id number]  Eduard Clos, MD Inpatient       Home/SNF/Other Home  Chief Complaint Acute respiratory failure with hypoxia and hypercapnia (HCC) [J96.01, J96.02]  Level of Care/Admitting Diagnosis ED Disposition     ED Disposition  Admit   Condition  --   Comment  Hospital Area: [facility] [[id number]]  Level of Care: Stepdown [14]  Admit to SDU based on following criteria: Respiratory Distress:  Frequent assessment and/or intervention to maintain adequate ventilation/respiration, pulmonary toilet, and respiratory treatment.  May admit patient to [facility] or [facility] if equivalent level of care is available:: Yes  Covid Evaluation: Asymptomatic - no recent exposure (last 10 days) testing not required  Diagnosis: Acute respiratory failure with hypoxia and hypercapnia [facility]) [[id number]]  Admitting Physician: Briscoe Deutscher [[id number]]  Attending Physician: Briscoe Deutscher [[id number]]  Certification:: I certify this patient will need  inpatient services for at least 2 midnights  Expected Medical Readiness: 02/19/2023          Medical History Past Medical History:  Diagnosis Date   Arthritis    Atrial fibrillation (HCC)    a. Dx 05/2014 in setting of seizure, PNA. S/p TEE/DCCV.   Bowel perforation (HCC)    a. transvere and R colectomy and ileostomy in 12/25/13.   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   Psoriasis    Seizures (HCC)    Venous stasis    Weakness of both legs     Allergies Allergies  Allergen Reactions   Penicillins Other (See Comments)    Pt has no personal reaction to this med, but his mother/brother do. Has patient had a PCN reaction causing immediate rash, facial/tongue/throat swelling, SOB or lightheadedness with hypotension: No Has patient had a PCN reaction causing severe rash involving mucus membranes or skin necrosis: No Has patient had a PCN reaction that required  hospitalization No Has patient had a PCN reaction occurring within the last 10 years: No If all of the above answers are "NO", then may proceed with Cephalosporin    IV Location/Drains/Wounds Patient Lines/Drains/Airways Status     Active Line/Drains/Airways     Name Placement date Placement time Site Days   Peripheral IV 02/16/23 18 G Left;Posterior Forearm 02/16/23  0150  Forearm  less than 1   Peripheral IV 02/16/23 18 G Posterior;Right Hand 02/16/23  0150  Hand  less than 1   Peripheral IV 02/16/23 20 G Left Antecubital 02/16/23  0150  Antecubital  less than 1   Colostomy RUQ 12/25/13  2000  RUQ  3340   Colostomy LUQ  12/25/13  2100  LUQ  3340   Airway 05/24/14  1305  -- 3190   Pressure Ulcer 05/20/14 Stage II -  Partial thickness loss of dermis presenting as a shallow open ulcer with a red, pink wound bed without slough. 05/20/14  2030  -- 3194   Wound / Incision (Open or Dehisced) 01/30/14 Incision - Dehisced Abdomen Mid 01/30/14  1145  Abdomen  3304   Wound / Incision (Open or Dehisced) 05/22/14 Incision - Open Abdomen Lower non healing hypergranulation tissue at the midline incision 05/22/14  --  Abdomen  3192   Wound / Incision (Open or Dehisced) 02/23/16 Other (Comment) Right;Left;Anterior;Medial weeping , cellulitis bil. lower legs 02/23/16  0624  --  2550            Labs/Imaging Results for orders placed or performed during the hospital encounter of 02/16/23 (from the past 48 hour(s))  Blood gas, venous     Status: Abnormal   Collection Time: 02/16/23  1:17 AM  Result Value Ref Range   pH, Ven 7.31 7.25 - 7.43   pCO2, Ven 73 (HH) 44 - 60 mmHg    Comment: CRITICAL RESULT CALLED TO, READ BACK BY AND VERIFIED WITH: [place] C. @0204  02/16/23 MCLEAN K.    pO2, Ven 56 (H) 32 - 45 mmHg   Bicarbonate 36.8 (H) 20.0 - 28.0 mmol/L   Acid-Base Excess 7.7 (H) 0.0 - 2.0 mmol/L   O2 Saturation 86.5 %   Patient temperature 37.0     Comment: Performed at [facility], [address], [place], [place] [address]  Comprehensive metabolic panel     Status: Abnormal   Collection Time: 02/16/23  1:23 AM  Result Value Ref Range   Sodium 134 (L) 135 - 145 mmol/L   Potassium 5.1 3.5 - 5.1 mmol/L   Chloride 96 (L) 98 - 111 mmol/L   CO2 31 22 - 32 mmol/L   Glucose, Bld 189 (H) 70 - 99 mg/dL    Comment: Glucose reference range applies only to samples taken after fasting for at least 8 hours.   BUN 17 8 - 23 mg/dL   Creatinine, Ser 7.84 0.61 - 1.24 mg/dL   Calcium 8.5 (L) 8.9 - 10.3 mg/dL   Total Protein 7.5 6.5 - 8.1 g/dL   Albumin 3.3 (L) 3.5 - 5.0 g/dL   AST 13 (L) 15 - 41 U/L   ALT  13 0 - 44 U/L   Alkaline Phosphatase 36 (L) 38 - 126 U/L   Total Bilirubin 0.6 0.3 - 1.2 mg/dL   GFR, Estimated >69 >62 mL/min    Comment: (NOTE) Calculated using the CKD-EPI Creatinine Equation (2021)    Anion gap 7 5 - 15    Comment: Performed at [facility], [address], [place], [place] [address]  CBC with Differential     Status: Abnormal   Collection Time: 02/16/23  1:23 AM  Result Value Ref Range   WBC 8.2 4.0 - 10.5 K/uL   RBC 4.47 4.22 - 5.81 MIL/uL   Hemoglobin 12.3 (L) 13.0 - 17.0 g/dL   HCT 84.6 96.2 - 95.2 %   MCV 95.1 80.0 - 100.0 fL   MCH 27.5 26.0 - 34.0 pg   MCHC 28.9 (L) 30.0 - 36.0 g/dL   RDW 84.1 (H) 32.4 - 40.1 %   Platelets 281 150 - 400 K/uL   nRBC 0.2 0.0 - 0.2 %   Neutrophils Relative % 79 %   Neutro Abs 6.5 1.7 - 7.7 K/uL   Lymphocytes Relative 10 %   Lymphs Abs 0.8 0.7 - 4.0 K/uL   Monocytes Relative 7 %   Monocytes Absolute 0.6 0.1 - 1.0 K/uL   Eosinophils Relative 3 %   Eosinophils Absolute 0.3 0.0 - 0.5 K/uL   Basophils Relative 0 %   Basophils Absolute 0.0 0.0 - 0.1 K/uL   Immature Granulocytes 1 %   Abs Immature Granulocytes 0.04 0.00 - 0.07 K/uL    Comment: Performed at [facility], [address], [place], [place] [address]  Protime-INR     Status: Abnormal   Collection Time: 02/16/23  1:23 AM  Result Value Ref Range   Prothrombin Time 16.5 (H) 11.4 - 15.2 seconds   INR 1.3 (H) 0.8 - 1.2    Comment: (NOTE) INR goal varies based on device and disease states. Performed at [facility], [address], [place], [place] [address]   APTT     Status: Abnormal   Collection Time: 02/16/23  1:23 AM  Result Value Ref Range   aPTT 67 (H) 24 - 36 seconds    Comment:        IF BASELINE aPTT IS ELEVATED, SUGGEST PATIENT RISK ASSESSMENT BE USED TO DETERMINE APPROPRIATE ANTICOAGULANT THERAPY. Performed at [facility], [address], [place], [place] [address]    Brain natriuretic peptide     Status: Abnormal   Collection Time: 02/16/23  1:23 AM  Result Value Ref Range   B Natriuretic Peptide 198.7 (H) 0.0 - 100.0 pg/mL    Comment: Performed at [facility], [address] [place], [place] [address]  POC CBG, ED     Status: Abnormal   Collection Time: 02/16/23  1:32 AM  Result Value Ref Range   Glucose-Capillary 187 (H) 70 - 99 mg/dL    Comment: Glucose reference range applies only to samples taken after fasting for at least 8 hours.  I-Stat Lactic Acid, ED     Status: None   Collection Time: 02/16/23  1:36 AM  Result Value Ref Range   Lactic Acid, Venous 1.1 0.5 - 1.9 mmol/L  Blood Culture (routine x 2)     Status: None (Preliminary result)   Collection Time: 02/16/23  1:45 AM   Specimen: BLOOD RIGHT HAND  Result Value Ref Range   Specimen Description BLOOD RIGHT HAND    Special Requests      BOTTLES DRAWN AEROBIC AND ANAEROBIC Blood Culture adequate volume   Culture      NO GROWTH < 12 HOURS Performed at [facility], [address], [place], [place] [address]    Report Status PENDING   Blood Culture (routine x 2)  Status: None (Preliminary result)   Collection Time: 02/16/23  2:04 AM   Specimen: BLOOD  Result Value Ref Range   Specimen Description BLOOD SITE NOT SPECIFIED    Special Requests      BOTTLES DRAWN AEROBIC AND ANAEROBIC Blood Culture results may not be optimal due to an excessive volume of blood received in culture bottles   Culture      NO GROWTH < 12 HOURS Performed at [facility], [address] [place], [place] [address]    Report Status PENDING   I-Stat Lactic Acid, ED     Status: None   Collection Time: 02/16/23  4:07 AM  Result Value Ref Range   Lactic Acid, Venous 0.8 0.5 - 1.9 mmol/L  Urinalysis, w/ Reflex to Culture (Infection Suspected) -Urine, Clean Catch     Status: Abnormal   Collection Time: 02/16/23  8:27 AM  Result Value Ref Range   Specimen Source URINE, CLEAN  CATCH    Color, Urine YELLOW YELLOW   APPearance HAZY (A) CLEAR   Specific Gravity, Urine 1.013 1.005 - 1.030   pH 5.0 5.0 - 8.0   Glucose, UA NEGATIVE NEGATIVE mg/dL   Hgb urine dipstick NEGATIVE NEGATIVE   Bilirubin Urine NEGATIVE NEGATIVE   Ketones, ur NEGATIVE NEGATIVE mg/dL   Protein, ur NEGATIVE NEGATIVE mg/dL   Nitrite NEGATIVE NEGATIVE   Leukocytes,Ua LARGE (A) NEGATIVE   RBC / HPF 6-10 0 - 5 RBC/hpf   WBC, UA >50 0 - 5 WBC/hpf    Comment:        Reflex urine culture not performed if WBC <=10, OR if Squamous epithelial cells >5. If Squamous epithelial cells >5 suggest recollection.    Bacteria, UA FEW (A) NONE SEEN   Squamous Epithelial / HPF 0-5 0 - 5 /HPF   Mucus PRESENT    Hyaline Casts, UA PRESENT     Comment: Performed at [facility], [address], [place], [place] [address]  Blood gas, venous     Status: Abnormal   Collection Time: 02/16/23  8:27 AM  Result Value Ref Range   pH, Ven 7.36 7.25 - 7.43   pCO2, Ven 69 (H) 44 - 60 mmHg   pO2, Ven <31 (LL) 32 - 45 mmHg    Comment: CRITICAL RESULT CALLED TO, READ BACK BY AND VERIFIED WITH: [place],D. RN AT 2440 ON 02/16/23. FA    Bicarbonate 39.0 (H) 20.0 - 28.0 mmol/L   Acid-Base Excess 10.8 (H) 0.0 - 2.0 mmol/L   O2 Saturation 47.2 %   Patient temperature 37.0     Comment: Performed at [facility], [address], [place], [place] [address]  Basic metabolic panel     Status: Abnormal   Collection Time: 02/16/23  8:27 AM  Result Value Ref Range   Sodium 134 (L) 135 - 145 mmol/L   Potassium 4.7 3.5 - 5.1 mmol/L   Chloride 93 (L) 98 - 111 mmol/L   CO2 34 (H) 22 - 32 mmol/L   Glucose, Bld 202 (H) 70 - 99 mg/dL    Comment: Glucose reference range applies only to samples taken after fasting for at least 8 hours.   BUN 16 8 - 23 mg/dL   Creatinine, Ser 5.36 (L) 0.61 - 1.24 mg/dL   Calcium 8.5 (L) 8.9 - 10.3 mg/dL   GFR, Estimated >64 >40 mL/min    Comment:  (NOTE) Calculated using the CKD-EPI Creatinine Equation (2021)    Anion  gap 7 5 - 15    Comment: Performed at [facility], [address], [place], [place] [address]  Magnesium     Status: None   Collection Time: 02/16/23  8:27 AM  Result Value Ref Range   Magnesium 1.9 1.7 - 2.4 mg/dL    Comment: Performed at [facility], [address], [place], [place] [address]  Phosphorus     Status: Abnormal   Collection Time: 02/16/23  8:27 AM  Result Value Ref Range   Phosphorus 2.4 (L) 2.5 - 4.6 mg/dL    Comment: Performed at [facility], [address], [place], [place] [address]   DG Chest Port 1 View  Result Date: 02/16/2023 CLINICAL DATA:  Questionable sepsis EXAM: PORTABLE CHEST 1 VIEW COMPARISON:  09/28/2022 FINDINGS: Hazy appearance of the bilateral chest, although somewhat greater on the right, with interstitial opacity. Fissural fluid is likely on the right. Cardiopericardial enlargement. Artifact from EKG leads. IMPRESSION: CHF pattern. Electronically Signed   By: Tiburcio Pea M.D.   On: 02/16/2023 04:23    Pending Labs Unresulted Labs (From admission, onward)     Start     Ordered   02/18/23 0500  Basic metabolic panel  Daily,   R     Comments: As Scheduled for 5 days    02/16/23 0815   02/17/23 0500  HIV Antibody (routine testing w rflx)  (HIV Antibody (Routine testing w reflex) panel)  Tomorrow morning,   R        02/16/23 0807   02/17/23 0500  CBC  Tomorrow morning,   R        02/16/23 0807   02/17/23 0500  Comprehensive metabolic panel  Tomorrow morning,   R        02/16/23 0807   02/16/23 0827  Urine Culture  Once,   R        02/16/23 0827   02/16/23 0809  Hemoglobin A1c  Once,   R       Comments: To assess prior glycemic control    02/16/23 0809            Vitals/Pain Today's Vitals   02/16/23 0900 02/16/23 0930 02/16/23 1000 02/16/23 1100  BP: (!) 160/101 (!) 159/106 (!) 154/106 (!) 163/88   Pulse: 73 (!) 114 65 91  Resp: 16 (!) 25 (!) 21 (!) 23  Temp:      TempSrc:      SpO2: 95% 94% 92% 96%  Weight:      Height:      PainSc:        Isolation Precautions No active isolations  Medications Medications  acetaminophen (TYLENOL) tablet 650 mg (has no administration in time range)    Or  acetaminophen (TYLENOL) suppository 650 mg (has no administration in time range)  ondansetron (ZOFRAN) tablet 4 mg (has no administration in time range)    Or  ondansetron (ZOFRAN) injection 4 mg (has no administration in time range)  insulin aspart (novoLOG) injection 0-20 Units (has no administration in time range)  atorvastatin (LIPITOR) tablet 10 mg (has no administration in time range)  DULoxetine (CYMBALTA) DR capsule 60 mg (60 mg Oral Given 02/16/23 0920)  gabapentin (NEURONTIN) capsule 800 mg (800 mg Oral Given 02/16/23 0918)  glipiZIDE (GLUCOTROL XL) 24 hr tablet 2.5 mg (has no administration in time range)  lamoTRIgine (LAMICTAL) tablet 150 mg (150 mg Oral Given 02/16/23 0919)  levETIRAcetam (KEPPRA) tablet 1,500 mg (1,500 mg  Oral Given 02/16/23 0918)  losartan (COZAAR) tablet 25 mg (25 mg Oral Given 02/16/23 0921)  metFORMIN (GLUCOPHAGE) tablet 1,000 mg (1,000 mg Oral Given 02/16/23 0920)  multivitamin with minerals tablet 1 tablet (1 tablet Oral Given 02/16/23 0921)  omega-3 acid ethyl esters (LOVAZA) capsule 1 g (has no administration in time range)  dabigatran (PRADAXA) capsule 150 mg (150 mg Oral Given 02/16/23 0917)  fenofibrate tablet 160 mg (has no administration in time range)  diltiazem (DILACOR XR) 24 hr capsule 240 mg (has no administration in time range)  empagliflozin (JARDIANCE) tablet 10 mg (has no administration in time range)  furosemide (LASIX) injection 40 mg (has no administration in time range)  acetaminophen (TYLENOL) tablet 650 mg (650 mg Oral Given 02/16/23 0354)  furosemide (LASIX) injection 40 mg (40 mg Intravenous Given 02/16/23 0450)  fentaNYL (SUBLIMAZE)  injection 50 mcg (50 mcg Intravenous Given 02/16/23 0745)    Mobility non-ambulatory

## 2023-02-16 NOTE — ED Provider Notes (Signed)
[facility] AT [facility] Provider Note  CSN: [id number] Arrival date & time: 02/16/23 0056  Chief Complaint(s) Shortness of Breath  HPI [name] is a 72 y.o. male with a past medical history listed below including absence seizure epilepsy, A-fib on Pradaxa, prior DVTs here for gradually worsening generalized fatigue and unresponsiveness.  EMS was called out by the patient's family member which she reports is his sister at home for being unresponsive.  When EMS arrived, they noted patient was hypoxic with sats in the 60s on room air.  He does not use supplemental oxygen at home.  Patient was placed on nonrebreather and brought in.  He remained hemodynamically stable and route.  EMS reports several intermittent bouts of unresponsiveness at which point the patient became apneic and desatted down to 60s.  These lasted few seconds to few minutes at a time.  EMS reports 3-4 times during transport.  The history is provided by the patient, the EMS personnel and a relative.    Past Medical History Past Medical History:  Diagnosis Date   Arthritis    Atrial fibrillation (HCC)    a. Dx 05/2014 in setting of seizure, PNA. S/p TEE/DCCV.   Bowel perforation (HCC)    a. transvere and R colectomy and ileostomy in 12/25/13.   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   Venous stasis 07/11/2016   Lower extremity edema 07/11/2016   Epilepsy without status epilepticus, not intractable (HCC) 07/05/2014   Absence epileptic syndrome, not intractable, with status epilepticus (HCC) 07/05/2014   Neuropathy involving both lower extremities 07/05/2014   PFO (patent foramen ovale)    PAF (paroxysmal atrial fibrillation) (HCC)    Obesity    Chronic anticoagulation 05/23/2014   Atrial fibrillation with rapid ventricular response (HCC) 05/22/2014   Seizures (HCC) 05/20/2014   Acute respiratory failure with hypoxia (HCC) 05/20/2014   Sepsis (HCC) 01/31/2014   Lower urinary tract infectious disease 01/30/2014   Fever 01/30/2014   Hyperglycemia 01/30/2014   Free intraperitoneal air 12/25/2013   Perforated viscus 12/25/2013   Vitamin B 12 deficiency 12/18/2013   Colon abnormality 12/18/2013   Peripheral neuropathy 09/05/2013   Hyperlipidemia    Bimalleolar ankle fracture 09/01/2013   History of DVT (deep vein thrombosis) 09/01/2013   Ankle fracture 09/01/2013   Fracture of ankle, closed 09/01/2013   Internal hemorrhoids 10/06/2012   External hemorrhoids 10/06/2012   Anal skin tag 10/06/2012   Home Medication(s) Prior to Admission medications   Medication Sig Start Date End Date Taking? Authorizing Provider  acetaminophen (TYLENOL) 500 MG tablet Take 500-1,000 mg by mouth every 6 (six) hours as  needed for mild pain, moderate pain, fever or headache.   Yes [provider]  Alpha-Lipoic Acid 600 MG TABS Take 1 tablet by mouth daily.   Yes [provider]  atorvastatin (LIPITOR) 10 MG tablet Take 10 mg by mouth at bedtime. 12/13/20  Yes [provider]  Calcium Carbonate-Vitamin D 600-400 MG-UNIT tablet Take 1 tablet by mouth daily.   Yes [provider]  Cholecalciferol (VITAMIN D3 PO)  Take 1 capsule by mouth daily.   Yes [provider]  DILT-XR 240 MG 24 hr capsule Take 240 mg by mouth daily. 10/28/21  Yes [provider]  doxepin (SINEQUAN) 75 MG capsule Take 150 mg by mouth at bedtime.    Yes [provider]  DULoxetine (CYMBALTA) 60 MG capsule Take 60 mg by mouth daily.   Yes [provider]  fenofibrate 160 MG tablet Take 160 mg by mouth daily.   Yes [provider]  Ferrous Sulfate (IRON PO) Take 1 tablet by mouth daily.   Yes [provider]  gabapentin (NEURONTIN) 800 MG tablet Take 800 mg by mouth 3 (three) times daily. 09/11/22  Yes [provider]  glipiZIDE (GLUCOTROL XL) 2.5 MG 24 hr tablet Take 2.5 mg by mouth in the morning and at bedtime.  05/16/17  Yes [provider]  Lactobacillus (PROBIOTIC ACIDOPHILUS PO) Take 1 tablet by mouth daily.   Yes [provider]  lamoTRIgine (LAMICTAL) 150 MG tablet Take 1 tablet (150 mg total) by mouth 2 (two) times daily. 03/11/22  Yes Dohmeier, Porfirio Mylar, MD  levETIRAcetam (KEPPRA) 750 MG tablet TAKE 2 TABLETS BY MOUTH 2 TIMES DAILY. PLEASE CALL AND MAKE OVERDUE APPT FOR FURTHER REFILLS. 12/02/22  Yes Dohmeier, Porfirio Mylar, MD  losartan (COZAAR) 25 MG tablet TAKE 1 TABLET (25 MG TOTAL) BY MOUTH 2 (TWO) TIMES DAILY. 12/25/22  Yes Hilty, Lisette Abu, MD  metFORMIN (GLUCOPHAGE) 1000 MG tablet Take 1,000 mg by mouth 2 (two) times daily. 12/18/20  Yes [provider]  Multiple Vitamin (MULTIVITAMIN WITH MINERALS) TABS Take 1 tablet by mouth daily.   Yes [provider]  naloxone (NARCAN) nasal spray 4 mg/0.1 mL Place 1 spray into the nose once. 08/29/20  Yes [provider]  Omega-3 Fatty Acids (FISH OIL) 1000 MG CAPS Take 1,000 mg by mouth 2 (two) times a day.   Yes [provider]  OVER THE COUNTER MEDICATION Apply 1 Application topically See admin instructions. Apply Silver Alginate Dressing to Slow healing wound   Yes [provider]  oxyCODONE-acetaminophen (PERCOCET) 10-325 MG tablet Take 1 tablet by mouth. 02/13/23  Yes [provider]  PRADAXA 150 MG CAPS capsule TAKE 1 CAPSULE BY MOUTH TWICE A DAY 12/29/22  Yes Hilty, Lisette Abu, MD  gentamicin ointment (GARAMYCIN) 0.1 % Apply 1 Application topically. Patient not taking: Reported on 02/16/2023 01/05/23   [provider]  nitrofurantoin (MACRODANTIN) 100 MG capsule Take 100 mg by mouth 2 (two) times daily. Patient not taking: Reported on 02/16/2023 01/13/23   [provider]  XTAMPZA ER 27 MG C12A Take 1 capsule by mouth every 12 (twelve) hours. Patient not taking: Reported on 02/16/2023 09/12/22   [provider]  Allergies Penicillins  Review of Systems Review of Systems As noted in HPI  Physical Exam Vital Signs  I have reviewed the triage vital signs BP 108/76 (BP Location: Right Arm)   Pulse 61   Temp 97.6 F (36.4 C) (Oral)   Resp 13   Ht 6\' 5"  (1.956 m)   Wt (!) 147.4 kg   SpO2 97%   BMI 38.54 kg/m   Physical Exam Vitals reviewed.  Constitutional:      General: He is not in acute distress.    Appearance: He is well-developed. He is not diaphoretic.  HENT:     Head: Normocephalic and atraumatic.     Nose: Nose normal.  Eyes:     General: No scleral icterus.       Right eye: No discharge.        Left eye: No discharge.     Conjunctiva/sclera: Conjunctivae normal.     Pupils: Pupils are equal, round, and reactive to light.  Cardiovascular:     Rate and Rhythm: Normal rate and regular rhythm.     Heart sounds: No murmur heard.    No friction rub. No gallop.  Pulmonary:     Effort: Pulmonary effort is normal. No respiratory distress.     Breath sounds: Normal breath sounds. No stridor. No rales.  Abdominal:     General: There is no distension.     Palpations: Abdomen is  soft.     Tenderness: There is no abdominal tenderness.  Musculoskeletal:        General: No tenderness.     Cervical back: Normal range of motion and neck supple.  Skin:    General: Skin is warm and dry.     Findings: No erythema or rash.  Neurological:     Mental Status: He is oriented to person, place, and time.     Comments: Sleepy but arouses easily.     ED Results and Treatments Labs (all labs ordered are listed, but only abnormal results are displayed) Labs Reviewed  BLOOD GAS, VENOUS - Abnormal; Notable for the following components:      Result Value   pCO2, Ven 73 (*)    pO2, Ven 56 (*)    Bicarbonate 36.8 (*)    Acid-Base Excess 7.7 (*)    All other components within normal limits  COMPREHENSIVE METABOLIC PANEL - Abnormal; Notable for the following components:   Sodium 134 (*)    Chloride 96 (*)    Glucose, Bld 189 (*)    Calcium 8.5 (*)    Albumin 3.3 (*)    AST 13 (*)    Alkaline Phosphatase 36 (*)    All other components within normal limits  CBC WITH DIFFERENTIAL/PLATELET - Abnormal; Notable for the following components:   Hemoglobin 12.3 (*)    MCHC 28.9 (*)    RDW 16.2 (*)    All other components within normal limits  PROTIME-INR - Abnormal; Notable for the following components:   Prothrombin Time 16.5 (*)    INR 1.3 (*)    All other components within normal limits  APTT - Abnormal; Notable for the following components:   aPTT 67 (*)    All other components within normal limits  BRAIN NATRIURETIC PEPTIDE - Abnormal; Notable for the following components:   B Natriuretic Peptide 198.7 (*)    All other components within normal limits  CBG MONITORING, ED - Abnormal; Notable for the following components:   Glucose-Capillary 187 (*)  All other components within normal limits  CULTURE, BLOOD (ROUTINE X 2)  CULTURE, BLOOD (ROUTINE X 2)  URINALYSIS, W/ REFLEX TO CULTURE (INFECTION SUSPECTED)  I-STAT CG4 LACTIC ACID, ED  I-STAT CG4 LACTIC ACID, ED                                                                                                                          EKG  EKG Interpretation Date/Time:  Monday February 16 2023 01:14:30 EDT Ventricular Rate:  113 PR Interval:    QRS Duration:  110 QT Interval:  339 QTC Calculation: 465 R Axis:   -39  Text Interpretation: Atrial fibrillation Left axis deviation Low voltage, extremity and precordial leads Abnormal R-wave progression, late transition Confirmed by Drema Pry [phone]) on 02/16/2023 1:23:56 AM       Radiology DG Chest Port 1 View  Result Date: 02/16/2023 CLINICAL DATA:  Questionable sepsis EXAM: PORTABLE CHEST 1 VIEW COMPARISON:  09/28/2022 FINDINGS: Hazy appearance of the bilateral chest, although somewhat greater on the right, with interstitial opacity. Fissural fluid is likely on the right. Cardiopericardial enlargement. Artifact from EKG leads. IMPRESSION: CHF pattern. Electronically Signed   By: Tiburcio Pea M.D.   On: 02/16/2023 04:23    Medications Ordered in ED Medications  acetaminophen (TYLENOL) tablet 650 mg (650 mg Oral Given 02/16/23 0354)  furosemide (LASIX) injection 40 mg (40 mg Intravenous Given 02/16/23 0450)   Procedures .1-3 Lead EKG Interpretation  Performed by: Nira Conn, MD Authorized by: Nira Conn, MD     Interpretation: abnormal     ECG rate assessment: normal     Rhythm: atrial fibrillation     Ectopy: none     Conduction: normal   .Critical Care  Performed by: Nira Conn, MD Authorized by: Nira Conn, MD   Critical care provider statement:    Critical care time (minutes):  45   Critical care time was exclusive of:  Separately billable procedures and treating other patients   Critical care was necessary to treat or prevent imminent or life-threatening deterioration of the following conditions:  Respiratory failure and cardiac failure   Critical care was time spent personally by me on the  following activities:  Development of treatment plan with patient or surrogate, discussions with consultants, evaluation of patient's response to treatment, examination of patient, obtaining history from patient or surrogate, review of old charts, re-evaluation of patient's condition, pulse oximetry, ordering and review of radiographic studies, ordering and review of laboratory studies and ordering and performing treatments and interventions   Care discussed with: admitting provider     (including critical care time) Medical Decision Making / ED Course   Medical Decision Making Amount and/or Complexity of Data Reviewed Labs: ordered. Decision-making details documented in ED Course. Radiology: ordered and independent interpretation performed. Decision-making details documented in ED Course. ECG/medicine tests: ordered and independent interpretation performed. Decision-making details documented in ED Course.  Risk OTC drugs. Prescription drug management. Decision  regarding hospitalization.    Workup for differential diagnosis listed below  Workup is consistent with CHF exacerbation.  Last EF in 2016 was 60 to 65%.  Will will need admission and repeat echo.  Patient given dose of IV Lasix.  Also placed on BiPAP to assist with pulmonary edema.    VBG notable for compensated respiratory acidosis with CO2 of 73.  Short-term BiPAP ordered.   CBC without leukocytosis or anemia. CMP without significant electrolyte derangements or renal sufficiency. No source of infection noted requiring antibiotics at this time.  Spoke with Dr. Antionette Char from hospitalist service who agrees to admit patient for further workup and management.    Final Clinical Impression(s) / ED Diagnoses Final diagnoses:  Heart failure, unspecified HF chronicity, unspecified heart failure type (HCC)  Acute respiratory failure with hypoxia (HCC)    This chart was dictated using voice recognition software.  Despite best efforts  to proofread,  errors can occur which can change the documentation meaning.    Nira Conn, MD 02/16/23 [phone]

## 2023-02-16 NOTE — Progress Notes (Signed)
PHARMACY - ANTICOAGULATION CONSULT NOTE  Pharmacy Consult for IV heparin (PTA Pradaxa) Indication: atrial fibrillation  Allergies  Allergen Reactions   Penicillins Other (See Comments)    Pt has no personal reaction to this med, but his mother/brother do. Has patient had a PCN reaction causing immediate rash, facial/tongue/throat swelling, SOB or lightheadedness with hypotension: No Has patient had a PCN reaction causing severe rash involving mucus membranes or skin necrosis: No Has patient had a PCN reaction that required hospitalization No Has patient had a PCN reaction occurring within the last 10 years: No If all of the above answers are "NO", then may proceed with Cephalosporin    Patient Measurements: Height: 6\' 5"  (195.6 cm) Weight: (!) 147.4 kg (325 lb) IBW/kg (Calculated) : 89.1 Heparin Dosing Weight: 122 kg  Vital Signs: Temp: 97.8 F (36.6 C) (10/07 0850) Temp Source: Oral (10/07 0850) BP: 163/88 (10/07 1100) Pulse Rate: 91 (10/07 1100)  Labs: Recent Labs    02/16/23 0123 02/16/23 0827  HGB 12.3*  --   HCT 42.5  --   PLT 281  --   APTT 67*  --   LABPROT 16.5*  --   INR 1.3*  --   CREATININE 0.65 0.57*    Estimated Creatinine Clearance: 136.6 mL/min (A) (by C-G formula based on SCr of 0.57 mg/dL (L)).   Medical History: Past Medical History:  Diagnosis Date   Arthritis    Atrial fibrillation (HCC)    a. Dx 05/2014 in setting of seizure, PNA. S/p TEE/DCCV.   Bowel perforation (HCC)    a. transvere and R colectomy and ileostomy in 12/25/13.   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 TEE 05/2014: Atrial septal aneurysm with likely small PFO,  bubble study not done.   Psoriasis    Seizures (HCC)    Venous stasis    Weakness of both legs     Medications:  Scheduled:   atorvastatin  10 mg Oral QHS   Chlorhexidine Gluconate Cloth  6 each Topical Daily   diltiazem  300 mg Oral Daily   DULoxetine  60 mg Oral Daily   fenofibrate  160 mg Oral Daily   furosemide  80 mg Intravenous BID   gabapentin  800 mg Oral TID   glipiZIDE  2.5 mg Oral BID WC   insulin aspart  0-20 Units Subcutaneous TID WC   lamoTRIgine  150 mg Oral BID   levETIRAcetam  1,500 mg Oral BID   losartan  25 mg Oral BID   metFORMIN  1,000 mg Oral BID WC   multivitamin with minerals  1 tablet Oral Daily   omega-3 acid ethyl esters  1 g Oral BID   Infusions:   Assessment: 72 yo with heart failure exacerbation. Plan to transition off of PTA Pradaxa while inpatient in anticipation of possible cardiac procedure to IV heparin. Patient received dose of Pradaxa this AM at 0917 so will not need to start the IV heparin until at least 12 hours after this dose. Baseline labs already done.   Goal of Therapy:  Heparin level 0.3-0.7 units/ml Monitor  platelets by anticoagulation protocol: Yes   Plan:  Start IV heparin at dose of 1750 units/hr, NO Bolus Check heparin level in AM Daily CBC  Berkley Harvey 02/16/2023,12:23 PM

## 2023-02-16 NOTE — ED Notes (Signed)
Date and time results received: 02/16/23 0205 (use smartphrase ".now" to insert current time)  Test: pCO2 Critical Value: 40  Name of Provider Notified: P.Cardama MD  Orders Received? Or Actions Taken?:  n/a

## 2023-02-16 NOTE — ED Notes (Addendum)
1:20 AM  Patient BIB EMS from home for SOB with dyspnea. Per EMS, patient was found satting at 54% on RA. Patient denies hx of CHF/COPD. He was brought in 15 L nonrebreather via EMS. Patient has diminished lung sounds to lower lung lobes. He also endorses left sided to medial CP, describing pain as pressure, rating pain 3/10 on pain scale. He is alert and oriented x 3, disoriented to time. He denies any nausea, vomiting, fevers, chills, diarrhea, abdominal pains, urinary sx, dizziness, lightheadedness, HA. Patient currently on 6 L [place] satting at 94%. RA sat at 82%. Patient has bilateral colostomies to upper abdominal quadrants. Provider at bedside. IV's placed. Labs drawn and sent. Respiratory present at bedside. Call light in reach. Bed in lowest position. Pending results.   2:30 AM  Patient colostomy bags emptied. Patient repositioned in stretcher for comfort. Patient in NAD. Bed in lowest position. Call light in reach. Plan of care ongoing.   2:40 AM  Patient sister present at bedside. Patient sister updated regarding patient's plan of care.   4:00 AM  Patient medicated per order. Patient provided ice chips per providers order. Patient in NAD. No further needs voiced by patient at this time. Call light in reach. Bed in lowest position. Plan of care ongoing.   4:55 AM  Patient medicated per order. Patient and sister informed of need for BIPAP and all questions and concerns answered by MD, who is present at bedside. Respiratory called for BIPAP placement. Patient placed on external condom catheter and informed of need for urine sample, to which he voices understanding. Vital signs updated. Pending admission to hospital. No further needs or concerns voiced by patient or family. Call light in reach. Bed in lowest position.   5:12 AM  Respiratory present at bedside.

## 2023-02-16 NOTE — Progress Notes (Signed)
   02/16/23 2143  BiPAP/CPAP/SIPAP  BiPAP/CPAP/SIPAP Pt Type Adult (Placed on for H/S, RN aware.)  BiPAP/CPAP/SIPAP V60  Mask Type Full face mask  Mask Size Large  Set Rate 12 breaths/min  Respiratory Rate 20 breaths/min  IPAP 8 cmH20  EPAP 4 cmH2O  FiO2 (%) 40 %  Minute Ventilation 9  Leak 18  Peak Inspiratory Pressure (PIP) 11  Tidal Volume (Vt) 448  Patient Home Equipment No  Auto Titrate No  Press High Alarm 25 cmH2O  Press Low Alarm 5 cmH2O  CPAP/SIPAP surface wiped down Yes   Pt. placed on BiPAP V60 at this time, RN aware, pt. tolerating well.  02/16/23 2143  BiPAP/CPAP/SIPAP  BiPAP/CPAP/SIPAP Pt Type Adult (Placed on for H/S, RN aware.)  BiPAP/CPAP/SIPAP V60  Mask Type Full face mask  Mask Size Large  Set Rate 12 breaths/min  Respiratory Rate 20 breaths/min  IPAP 8 cmH20  EPAP 4 cmH2O  FiO2 (%) 40 %  Minute Ventilation 9  Leak 18  Peak Inspiratory Pressure (PIP) 11  Tidal Volume (Vt) 448  Patient Home Equipment No  Auto Titrate No  Press High Alarm 25 cmH2O  Press Low Alarm 5 cmH2O  CPAP/SIPAP surface wiped down Yes   Pt. placed on BiPAP V-60 at this time, RN aware, pt. t  02/16/23 2143  BiPAP/CPAP/SIPAP  BiPAP/CPAP/SIPAP Pt Type Adult (Placed on for H/S, RN aware.)  BiPAP/CPAP/SIPAP V60  Mask Type Full face mask  Mask Size Large  Set Rate 12 breaths/min  Respiratory Rate 20 breaths/min  IPAP 8 cmH20  EPAP 4 cmH2O  FiO2 (%) 40 %  Minute Ventilation 9  Leak 18  Peak Inspiratory Pressure (PIP) 11  Tidal Volume (Vt) 448  Patient Home Equipment No  Auto Titrate No  Press High Alarm 25 cmH2O  Press Low Alarm 5 cmH2O  CPAP/SIPAP surface wiped down Yes   olerating well.

## 2023-02-16 NOTE — Consult Note (Addendum)
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  Affected area is approx 10% black at the wound edges, 90% red and moist, mod amt tan drainage. 12X20X.3cm with loose shaggy edges, appearance is also consistent with chronic shear. Pt turned, cleaned, and linens changed. Inner groin and abd skin folds are red, moist and macerated.  Appearance is consistent with intertrigo and moisture associated skin damage and probable candidiasis Bilat legs with dry crusted skin; appearance is consistent with chronic venous stasis changes. No open wounds or drainage.    Dressing procedure/placement/frequency: Pt is on a low airloss mattress to decrease pressure and has a Purewick to attempt to contain his urine. Topical treatment orders provided for bedside nurses to perform as follows: Apply Xeroform gauze and abd pad to left buttock/outer thigh Q day. Interdry silver-impregnated fabric in the skin folds to absorb moisture and provide antimicrobial benefits.  Eucerin cream to assist with removal of loose skin to bilat legs.   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 There is a full thickness wound from 10:00 o;clock to 12:00 o'clock to the peristomal skin area, red and moist, approx 2X4X.2cm, small amt yellow drainage. Pouch changes performed.  Instructions provided for bedside nurses to perform as follows: Use Supplies: barrier ring [place] # [phone] and flat pouch [place] # [phone] to left stoma Barrier ring [place] # [id number] and convex pouch Lawson # [phone] to right stoma.  Apply foam dressing under right pouch to wound each time.  2 sets of  each extra supply left at the bedside.  Pt states ostomy has been in place several years.   Please re-consult if further assistance is needed.  Thank-you,  Cammie Mcgee MSN, RN, CWOCN, [place], CNS [phone]

## 2023-02-16 NOTE — Consult Note (Addendum)
Cardiology Consultation   Patient ID: [name] MRN: [id number]; DOB: [birth year]  Admit date: 02/16/2023 Date of Consult: 02/16/2023  PCP:  Caffie Damme, MD   [facility] Providers Cardiologist:  Chrystie Nose, MD   {  Patient Profile:   [name] is a 72 y.o. 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 Robb Matar.  History of Present Illness:   [name] is a 72 year old male with the above history who is followed by Dr. Rennis Golden. He is primarily followed by Dr. Rennis Golden for paroxysmal atrial fibrillation He was initially diagnosed with atrial fibrillation in 05/2014 during an admission for pneumonia. She underwent successful TEE/ DCCV with restoration of sinus rhythm. He also has chronic lower extremity edema due to venous stasis and is bedbound due to severe peripheral neuropathy. He has not had an in-person office visit since 2019 due to being bedbound. His last virtual visit was in 05/2022 with Dr. Rennis Golden as which time he was doing well from a cardiac standpoint without any symptomatic atrial fibrillation. He was following with [facility] for a sacral decubitus ulcer.  Patient presented to the ED via EMS earlier this morning after being found unresponsive by his sister. He was hypoxic on EMS arrival with sats in the 60s. He was placed on a non-rebreather. EMS reported intermittent bouts of unresponsiveness at which point patient became apneic and desatted down to the 60s. These episodes would last anywhere from a few seconds to a few minutes.  Upon arrival to the ED, O2 sats in the low 80s on room air but  improved with supplemental O2 via nasal cannula. EKG showed atrial fibrillation, rate 113 bpm, with non-specific T wave changes. BNP mildly elevated at 198. Chest x-ray showed cardiopericardial enlargement with hazy appearance of the bilateral chest and interstitial opacities felt to be consistent with CHF. WBC 8.2, Hgb 12.3. Plts 281. Na 134, K 5.1, Glucose 189, BUN 17, Cr 0.65. Albumin 3.3, AST 13, ALT 13, Alk Phos 36, Total Bili 0.6. Lactic acid 1.1. Venous blood gas showed pH of 7.31, pCO2 of 73, pO2 56, Bicarb 36.8. Blood cultures negative to date. He was started on BiPAP and given a dose of IV Lasix. He was admitted and Cardiology consulted for further evaluation.  At the time of this evaluation, patient is off BiPAP and on 4L of HFNC with O2 sats in the low 90s. Sister is at bedside. Patient is bedbound due to his severe peripheral neuropathy and lives with his sister. Sister states that O2 sat has been low the past 1-2 days and his extremities have felt very cold. She states his O2 sat had dropped to the 60s to 70s (as low as 49 at one point). His sister thought he may just be dehydrate. Patient woke up shortly after midnight today to go to the bathroom. When his sister went to dump out his urinal, she noticed that he was very lethargic and minimally responsive. She states that he was groaning and kept falling back to sleep easily. This is why she called 911. However, he denies feeling short of breath. He is not on any supplemental O2  at home. He denies any orthopnea or PND. He has chronic lower extremity edema due to venous stais but this has actually been doing well the last couple of years. He denies any chest pain. No palpitations, lightheadedness, dizziness, or syncope. He denies any recent fevers or illness. No URI or GI symptoms. She states he had a little bit of blood in his urine a while ago but these resolved after treatment of UTI with antibiotics. No other abnormal bleeding. He reports  compliance with his Pradaxa.   Past Medical History:  Diagnosis Date   Arthritis    Atrial fibrillation (HCC)    a. Dx 05/2014 in setting of seizure, PNA. S/p TEE/DCCV.   Bowel perforation (HCC)    a. transvere and R colectomy and ileostomy in 12/25/13.   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   Psoriasis    Seizures (HCC)    Venous stasis    Weakness of both legs     Past Surgical History:  Procedure Laterality Date   ANKLE FRACTURE SURGERY  09/04/13   CARDIOVERSION N/A 05/24/2014   Procedure: CARDIOVERSION;  Surgeon: Wendall Stade, MD;  Location: [facility] ENDOSCOPY;  Service: Cardiovascular;  Laterality: N/A;   COLONOSCOPY  03/26/2012   Procedure: COLONOSCOPY;  Surgeon: Theda Belfast, MD;  Location: [facility] ENDOSCOPY;  Service: Endoscopy;  Laterality: N/A;   COLOSTOMY  12-25-13   Ruptured colon   FLEXIBLE SIGMOIDOSCOPY N/A 12/09/2013   Procedure: FLEXIBLE SIGMOIDOSCOPY;  Surgeon: Theda Belfast, MD;  Location: [facility] ENDOSCOPY;  Service: Endoscopy;  Laterality: N/A;   LAPAROTOMY N/A 12/25/2013   Procedure: EXPLORATORY LAPAROTOMY/Transverse and right coloectomy/ end  ileostomy and mucus  fistula;  Surgeon: Axel Filler, MD;  Location: [facility] OR;  Service: General;  Laterality: N/A;   TEE WITHOUT CARDIOVERSION N/A 05/24/2014   Procedure: TRANSESOPHAGEAL ECHOCARDIOGRAM (TEE);  Surgeon: Wendall Stade, MD;  Location: [facility] ENDOSCOPY;  Service: Cardiovascular;  Laterality: N/A;   TONSILLECTOMY     as child    Home Medications:  Prior to Admission medications   Medication Sig Start Date End Date Taking?  Authorizing Provider  acetaminophen (TYLENOL) 500 MG tablet Take 500-1,000 mg by mouth every 6 (six) hours as needed for mild pain, moderate pain, fever or headache.   Yes [provider]  Alpha-Lipoic Acid 600 MG TABS Take 1 tablet by mouth daily.   Yes [provider]  atorvastatin (LIPITOR) 10 MG tablet Take 10 mg by mouth at bedtime. 12/13/20  Yes [provider]  Calcium Carbonate-Vitamin D 600-400 MG-UNIT tablet Take 1 tablet by mouth daily.   Yes [provider]  Cholecalciferol (VITAMIN D3 PO) Take 1 capsule by mouth daily.   Yes [provider]  DILT-XR 240 MG 24 hr capsule Take 240 mg by mouth daily. 10/28/21  Yes [provider]  doxepin (SINEQUAN) 75 MG capsule Take 150 mg by mouth at bedtime.    Yes [provider]  DULoxetine (CYMBALTA) 60 MG capsule Take 60 mg by mouth daily.  Yes [provider]  fenofibrate 160 MG tablet Take 160 mg by mouth daily.   Yes [provider]  Ferrous Sulfate (IRON PO) Take 1 tablet by mouth daily.   Yes [provider]  gabapentin (NEURONTIN) 800 MG tablet Take 800 mg by mouth 3 (three) times daily. 09/11/22  Yes [provider]  glipiZIDE (GLUCOTROL XL) 2.5 MG 24 hr tablet Take 2.5 mg by mouth in the morning and at bedtime.  05/16/17  Yes [provider]  Lactobacillus (PROBIOTIC ACIDOPHILUS PO) Take 1 tablet by mouth daily.   Yes [provider]  lamoTRIgine (LAMICTAL) 150 MG tablet Take 1 tablet (150 mg total) by mouth 2 (two) times daily. 03/11/22  Yes Dohmeier, Porfirio Mylar, MD  levETIRAcetam (KEPPRA) 750 MG tablet TAKE 2 TABLETS BY MOUTH 2 TIMES DAILY. PLEASE CALL AND MAKE OVERDUE APPT FOR FURTHER REFILLS. 12/02/22  Yes Dohmeier, Porfirio Mylar, MD  losartan (COZAAR) 25 MG tablet TAKE 1 TABLET (25 MG TOTAL) BY MOUTH 2 (TWO) TIMES DAILY. 12/25/22  Yes Hilty, Lisette Abu, MD  metFORMIN (GLUCOPHAGE) 1000 MG tablet Take 1,000 mg by mouth 2 (two) times daily.  12/18/20  Yes [provider]  Multiple Vitamin (MULTIVITAMIN WITH MINERALS) TABS Take 1 tablet by mouth daily.   Yes [provider]  naloxone (NARCAN) nasal spray 4 mg/0.1 mL Place 1 spray into the nose once. 08/29/20  Yes [provider]  Omega-3 Fatty Acids (FISH OIL) 1000 MG CAPS Take 1,000 mg by mouth 2 (two) times a day.   Yes [provider]  OVER THE COUNTER MEDICATION Apply 1 Application topically See admin instructions. Apply Silver Alginate Dressing to Slow healing wound   Yes [provider]  oxyCODONE-acetaminophen (PERCOCET) 10-325 MG tablet Take 1 tablet by mouth. 02/13/23  Yes [provider]  PRADAXA 150 MG CAPS capsule TAKE 1 CAPSULE BY MOUTH TWICE A DAY 12/29/22  Yes Hilty, Lisette Abu, MD  gentamicin ointment (GARAMYCIN) 0.1 % Apply 1 Application topically. Patient not taking: Reported on 02/16/2023 01/05/23   [provider]  nitrofurantoin (MACRODANTIN) 100 MG capsule Take 100 mg by mouth 2 (two) times daily. Patient not taking: Reported on 02/16/2023 01/13/23   [provider]  XTAMPZA ER 27 MG C12A Take 1 capsule by mouth every 12 (twelve) hours. Patient not taking: Reported on 02/16/2023 09/12/22   [provider]    Inpatient Medications: Scheduled Meds:  atorvastatin  10 mg Oral QHS   dabigatran  150 mg Oral BID   diltiazem  300 mg Oral Daily   DULoxetine  60 mg Oral Daily   fenofibrate  160 mg Oral Daily   furosemide  40 mg Intravenous BID   gabapentin  800 mg Oral TID   glipiZIDE  2.5 mg Oral BID   insulin aspart  0-20 Units Subcutaneous TID WC   lamoTRIgine  150 mg Oral BID   levETIRAcetam  1,500 mg Oral BID   losartan  25 mg Oral BID   metFORMIN  1,000 mg Oral BID WC   multivitamin with minerals  1 tablet Oral Daily   omega-3 acid ethyl esters  1 g Oral BID   Continuous Infusions:  PRN Meds: acetaminophen **OR** acetaminophen, ondansetron **OR** ondansetron (ZOFRAN) IV  Allergies:     Allergies  Allergen Reactions   Penicillins Other (See Comments)    Pt has no personal reaction to this med, but his mother/brother do. Has patient had a PCN reaction causing immediate rash, facial/tongue/throat swelling, SOB  or lightheadedness with hypotension: No Has patient had a PCN reaction causing severe rash involving mucus membranes or skin necrosis: No Has patient had a PCN reaction that required hospitalization No Has patient had a PCN reaction occurring within the last 10 years: No If all of the above answers are "NO", then may proceed with Cephalosporin    Social History:   Social History   Socioeconomic History   Marital status: Single    Spouse name: Not on file   Number of children: 0   Years of education: HS   Highest education level: Not on file  Occupational History   Not on file  Tobacco Use   Smoking status: Former    Types: Pipe    Quit date: 08/10/2013    Years since quitting: 9.5   Smokeless tobacco: Never  Substance and Sexual Activity   Alcohol use: No    Alcohol/week: 0.0 standard drinks of alcohol   Drug use: No   Sexual activity: Not on file  Other Topics Concern   Not on file  Social History Narrative   Patient is single and lives with his sister.   Patient is retired from [employer].   Patient has a high school education.   Patient drinks one cup of caffeine daily.   Patient is right-handed.      Social Determinants of Health   Financial Resource Strain: Not on file  Food Insecurity: Not on file  Transportation Needs: Not on file  Physical Activity: Not on file  Stress: Not on file  Social Connections: Not on file  Intimate Partner Violence: Not on file    Family History:   Family History  Problem Relation Age of Onset   Vision loss Father    Hypertension Father    Diabetes Father    Heart disease Father    Cancer Father        colon   Diabetes Mother    Hypertension Mother    Heart disease Mother    Dementia Mother    Cancer  Mother        breast ca   Diabetes Sister    Hyperlipidemia Sister    Hypertension Sister    Diabetes Brother    Kidney disease Brother    Heart disease Brother      ROS:  Please see the history of present illness.  All other ROS reviewed and negative.     Physical Exam/Data:   Vitals:   02/16/23 0900 02/16/23 0930 02/16/23 1000 02/16/23 1100  BP: (!) 160/101 (!) 159/106 (!) 154/106 (!) 163/88  Pulse: 73 (!) 114 65 91  Resp: 16 (!) 25 (!) 21 (!) 23  Temp:      TempSrc:      SpO2: 95% 94% 92% 96%  Weight:      Height:       No intake or output data in the 24 hours ending 02/16/23 1159    02/16/2023    1:47 AM 09/28/2022    4:36 PM 01/10/2022    5:00 AM  Last 3 Weights  Weight (lbs) 325 lb 318 lb 319 lb 12.8 oz  Weight (kg) 147.419 kg 144.244 kg 145.06 kg     Body mass index is 38.54 kg/m.  General: 72 y.o. morbidly obese Caucasian male resting comfortably in no acute distress. HEENT: Normocephalic and atraumatic. Sclera clear.  Neck: Supple. Unable to assess JVD due to body habitus and neck size. Heart: Tachycardic with irregularly irregular rhythm.  No murmurs, gallops, or rubs. Radial pulses 2+ and equal bilaterally. Lungs: No increased work of breathing. Clear to ausculation bilaterally. No wheezes, rhonchi, or rales.  Abdomen: Soft, non-distended, and non-tender to palpation. Bowel sounds present in all 4 quadrants.  MSK: Normal strength and tone for age. Extremities: 1+ pitting edema of bilateral lower extremities (mostly around ankles) Radial pulses 2+ and equal bilaterally. Distal pedal pulses difficult to palpate. Skin: Lower extremities (especially bilaterally feet) cool the touch. Chronic hyperpigmentation and skin changes consistent with venous insufficiency/ venous stasis. Neuro: Alert and oriented x3. No focal deficits. Psych: Normal affect. Responds appropriately.   EKG:  The EKG was personally reviewed and demonstrates:  Atrial fibrillation, rate 113 bpm,  with non-specific T wave changes.  Telemetry:  Telemetry was personally reviewed and demonstrates:  Atrial fibrillation with rates mostly in the 100s to 110s (but as high as 130s to 140 at times).  Relevant CV Studies:  Echocardiogram 05/23/2014: Study Conclusions: - Left ventricle: The cavity size was normal. There was mild    concentric hypertrophy. Systolic function was vigorous. The    estimated ejection fraction was in the range of 65% to 70%. Wall    motion was normal; there were no regional wall motion    abnormalities. The study was not technically sufficient to allow    evaluation of LV diastolic dysfunction due to atrial    fibrillation.  - Aortic valve: Trileaflet; normal thickness leaflets. There was no    regurgitation.  - Aortic root: The aortic root was normal in size.  - Mitral valve: Structurally normal valve. There was no    regurgitation.  - Left atrium: The atrium was mildly dilated.  - Right ventricle: Systolic function was normal.  - Right atrium: The atrium was normal in size.  - Tricuspid valve: There was mild regurgitation.  - Pulmonic valve: There was no regurgitation.  - Pulmonary arteries: Systolic pressure was within the normal    range.  - Inferior vena cava: The vessel was normal in size.  - Pericardium, extracardiac: There was no pericardial effusion.   Impressions: - Limited quality study, but there appears to be normal    biventricular size and systolic function. No regional wall motion    abnormalities.    Mild tricuspid regurgitation.    Laboratory Data:  High Sensitivity Troponin:  No results for input(s): "TROPONINIHS" in the last 720 hours.   Chemistry Recent Labs  Lab 02/16/23 0123 02/16/23 0827  NA 134* 134*  K 5.1 4.7  CL 96* 93*  CO2 31 34*  GLUCOSE 189* 202*  BUN 17 16  CREATININE 0.65 0.57*  CALCIUM 8.5* 8.5*  MG  --  1.9  GFRNONAA >60 >60  ANIONGAP 7 7    Recent Labs  Lab 02/16/23 0123  PROT 7.5  ALBUMIN 3.3*   AST 13*  ALT 13  ALKPHOS 36*  BILITOT 0.6   Lipids No results for input(s): "CHOL", "TRIG", "HDL", "LABVLDL", "LDLCALC", "CHOLHDL" in the last 168 hours.  Hematology Recent Labs  Lab 02/16/23 0123  WBC 8.2  RBC 4.47  HGB 12.3*  HCT 42.5  MCV 95.1  MCH 27.5  MCHC 28.9*  RDW 16.2*  PLT 281   Thyroid No results for input(s): "TSH", "FREET4" in the last 168 hours.  BNP Recent Labs  Lab 02/16/23 0123  BNP 198.7*    DDimer No results for input(s): "DDIMER" in the last 168 hours.   Radiology/Studies:  [facility] Chest Port 1 [address]  Result Date: 02/16/2023 CLINICAL DATA:  Questionable sepsis EXAM: PORTABLE CHEST 1 VIEW COMPARISON:  09/28/2022 FINDINGS: Hazy appearance of the bilateral chest, although somewhat greater on the right, with interstitial opacity. Fissural fluid is likely on the right. Cardiopericardial enlargement. Artifact from EKG leads. IMPRESSION: CHF pattern. Electronically Signed   By: Tiburcio Pea M.D.   On: 02/16/2023 04:23     Assessment and Plan:   Acute Hypoxic and Hypercarbic Respiratory Failure Patient presented to the ED after sister found him unresponsive.  HE was noted to be severely hypoxic on arrival of EMS with O2 sats as low as the 60s. Venous blood gas in the ED showed pH of 7.31, pCO2 of 73, pO2 56, Bicarb 36.8. BNP mildly elevated at 198. Chest x-ray showed cardiopericardial enlargement with hazy appearance of the bilateral chest and interstitial opacities felt to be consistent with CHF. He was initially on BiPAP but now is on 4L of O2 via HFNC. - See recommendations for CHF as above.  - Otherwise, per primary team. Suspect respiratory failure is multifactorial (deconditioning, BMI, suspected obstructive sleep apnea/ obesity hypoventilation syndrome, and CHF). He would benefit from outpatient sleep study. Of note, he has a history of multiple DVTs and is on Pradaxa for anticoagulation. He denies missing any doses. However, will discuss getting CTA to  rule out PE with MD given significant hypoxia as well as tachycardia on presentation.  Acute HFpEF Patient presented to the ED after sister found him unresponsive.  HE was noted to be severely hypoxic on arrival of EMS with O2 sats as low as the 60s. Venous blood gas in the ED showed pH of 7.31, pCO2 of 73, pO2 56, Bicarb 36.8. BNP mildly elevated at 198. Chest x-ray showed cardiopericardial enlargement with hazy appearance of the bilateral chest and interstitial opacities felt to be consistent with CHF. He was started on BiPAP and given one dose of IV Lasix 40mg  in the ED. - Difficult to assess volume status due to body habitus. He is currently on 4L of HFNC. Sister states he did not have significant increase in urine output after 40mg  of IV Lasix. - Echo pending. Would like to get heart rate better controlled prior to this. Johnnette Gourd increase IV Lasix to 80mg  twice daily.  - Primary team as started Jardiance. I would recommend holding off on SGLT2 inhibitor given he is bedbound and prone to UTIs per sister. Will cancel this. - If EF is down, will need to add GDMT. - Continue to monitor daily weights, strict I/Os, and renal function.  Persistent Atrial Fibrillation Patient has a history of long-standing persistent atrial fibrillation. Rates as high as the 140s on admission. - Rates currently in the 100s to 100s. - On Diltiazem XR 240mg  daily at home. Will increase to 300mg  daily. However, if EF is down, will need to stop this. - Will hold off on adding beta-blocker at this time given decompensated CHF. - On Pradaxa at home. Will stop this and switch to IV Heparin in case in procedures are required. - Suspect RVR is also related to respiratory failure and has this improves so will his heart rate.  Hypertension BP elevated. - Will increase Diltiazem XR to 300mg  daily as above. - Continue Losartan 25mg  twice daily. - Can continue to adjust medications as needed.  Hyperlipidemia - Continue  Lipitor 10mg  daily.  Cool Extremities Sister reports lower extremities have been cool the last couple of days. He has chronic lower extremity edema secondary to venous  stasis with chronic skin changes from this but sister states this has actually been well controlled. - Lower extremities are cool on exam and distal pedal pulses difficult to palpate.  - He is bed bound and has chronic pain from peripheral neuropathy so difficult to tell whether he has had any claudication.  - Will check ABIs and lower arterial dopplers..  Otherwise, per primary team: - Type 2 diabetes mellitus - Severe peripheral neuropathy - S/p colectomy and ileostomy  - Seizure disorder - Multiple DVT - Chronic pain - Sacral decubitus ulcer   Risk Assessment/Risk Scores:    New York Heart Association (NYHA) Functional Class Unable to assess functional class as patient is bedbound but he denies any resting symtpoms.   CHA2DS2-VASc Score = 4  This indicates a 4.8% annual risk of stroke. The patient's score is based upon: CHF History: 1 HTN History: 1 Diabetes History: 1 Stroke History: 0 Vascular Disease History: 0 Age Score: 1 Gender Score: 0   For questions or updates, please contact  [facility] Please consult [email or web address] for contact info under    Signed, Corrin Parker, PA-C  02/16/2023 11:59 AM   Personally seen and examined. Agree with APP above with the following comments:  Briefly 72 yo M with a history of permanent AF,Chronic venous statis with multiple DVTs, HTN, HLD, and DM with peripheral neuropathy and morbid obesity.  He has a hx of bowel perforation and has a R sided colectomy.  Patient notes that he hasn't been himself lately.  Sister noted that she was using the urinal late late night and has AMS.  She has noticed decreased UOP and LE edema; legs were cool to the touch.  This lead to ED eval and admission.  Patient is somnolent; at time of evaluation, I increased him to  8 L.  He denies symptoms but gave limited verbal feedback.  RT in the room, BIPAP in the room.  Exam notable for  Morbid obesity , Mild distress Asterixis; responsive but not conversant Decrease breath sounds in bases, IRIR tachycardia Non draining ileostomy, dullness to abdominal percussion +1 bilateral pitting edema, legs warm to touch, difficult to Doppler pulses  BNP mildly elevated 198  Would recommend  Permanent Atrial Fibrillation - Plan to switch to a short-acting anticoagulant and hold off further AV nodal agents in cases procedures are needed; I suspect his LVEF may not be still preserved - transition to heparin  Acute on Chronic Heart Failure (NOS) - Pending echocardiogram, I have asked to expedite. - normal LFTs and lactate would argue against cardiogenic shock; his AMS could be hypercarbic in nature,  -  Plan to increase Lasix to twice daily (80 IV), hold off on SGLT2 inhibitor, and monitor weights, ins and outs, renal function, and electrolytes. -Monitor weights, ins and outs, renal function, and electrolytes. - low threshold to return to BIPAP  Morbid Obesity, Hypertension, Hyperlipidemia - Continue atorvastatin 10mg . Losartan 50mg  PO split into 25mg  BID is reasonable for now. May eventually need to be on Entresto.  Riley Lam, MD FASE [facility] Cardiologist [facility]  [address] [place], [address] [place], [place] [address] [phone]  12:14 PM

## 2023-02-16 NOTE — Progress Notes (Signed)
Ankle-brachial index completed. Please see CV Procedures for preliminary results.  Shona Simpson, RVT 02/16/23 2:30 PM

## 2023-02-16 NOTE — ED Triage Notes (Signed)
Pt. BIB [facility] for shortness of breath. Pt. Meets EMS sepsis criteria. EMS called by wife for unresponsiveness. Pt. Hypoxic on EMS arrival. C/o of back pain. Pt. Is bed bound.

## 2023-02-16 NOTE — H&P (Signed)
History and Physical    Patient: [name] BJY:[id number] DOB: 1950/06/01 DOA: 02/16/2023 DOS: the patient was seen and examined on 02/16/2023 PCP: Caffie Damme, MD  Patient coming from: Home  Chief Complaint:  Chief Complaint  Patient presents with   Shortness of Breath   HPI: [name] is a 72 y.o. male with medical history significant of osteoarthritis, paroxysmal atrial fibrillation, patent foramen ovale, history of right LE DVT, history of left LE DVT, venous stasis, hyperlipidemia, BPH, hypertension, class II obesity, psoriasis, seizure disorder, perforated viscus in 2015 undergoing exploratory laparotomy with proximal transverse and right coloectomy/ end ileostomy and mucus who presented to the emergency department complaints of dyspnea for the past 2 days.  Last night he went to urinate.  His sister went to jump out of the patient's urinal and found him unresponsive.  EMS was called.  EMS noticed on brief periods of apnea with O2 saturations in the 60s and he was placed on nonrebreather mask.  He was subsequently placed on BiPAP in the emergency department but he has improved and is on nasal cannula at 4 LPM. He denied fever, chills, rhinorrhea, sore throat, wheezing or hemoptysis.  No chest pain, palpitations, diaphoresis, PND, orthopnea but occasionally has pitting edema of the lower extremities.  No abdominal pain, nausea, emesis, diarrhea, constipation, melena or hematochezia.  No flank pain, dysuria, frequency or hematuria.  No polyuria, polydipsia, polyphagia or blurred vision.  He has been having significant bilateral feet neuropathy pain.  Lab work: CBC showed white count of 8.2, hemoglobin 12.3 g/dL and platelets 086.  PT was 16.5, INR 1.3 and PTT 67.  Lactic acid was normal twice.  BNP 199 pg/mL.  CMP with normal electrolytes after sodium and calcium correction, normal renal function, glucose 189 mg deciliter, albumin 3.3 g/dL, AST was 13 units/L and alkaline phosphatase 36  units/L.  Normal total protein, ALT and total bilirubin.  Imaging: Portable 1 view chest radiograph showing hazy appearance of the chest bilaterally with ca cardiopericardial enlargement consistent with CHF.  ED course: Initial vital signs were temperature 97.7 F, pulse 104, respirations 22, BP 124/67 mmHg and O2 sat 82% on room air.  The patient was subsequently placed on nasal cannula 6 L/min but then require BiPAP ventilation.   Review of Systems: As mentioned in the history of present illness. All other systems reviewed and are negative. Past Medical History:  Diagnosis Date   Arthritis    Atrial fibrillation (HCC)    a. Dx 05/2014 in setting of seizure, PNA. S/p TEE/DCCV.   Bowel perforation (HCC)    a. transvere and R colectomy and ileostomy in 12/25/13.   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   Psoriasis    Seizures (HCC)    Venous stasis    Weakness of both legs    Past Surgical History:  Procedure Laterality Date   ANKLE FRACTURE SURGERY  09/04/13   CARDIOVERSION N/A 05/24/2014   Procedure: CARDIOVERSION;  Surgeon: Wendall Stade, MD;  Location:  [facility]COPY;  Service: Cardiovascular;  Laterality: N/A;   COLONOSCOPY  03/26/2012   Procedure: COLONOSCOPY;  Surgeon: Theda Belfast, MD;  Location: [facility] ENDOSCOPY;  Service: Endoscopy;  Laterality: N/A;   COLOSTOMY  12-25-13   Ruptured colon   FLEXIBLE SIGMOIDOSCOPY N/A 12/09/2013   Procedure: FLEXIBLE SIGMOIDOSCOPY;  Surgeon: Theda Belfast, MD;  Location: [facility] ENDOSCOPY;  Service: Endoscopy;  Laterality: N/A;   LAPAROTOMY N/A 12/25/2013    Procedure: EXPLORATORY LAPAROTOMY/Transverse and right coloectomy/ end  ileostomy and mucus  fistula;  Surgeon: Axel Filler, MD;  Location: [facility] OR;  Service: General;  Laterality: N/A;   TEE WITHOUT CARDIOVERSION N/A 05/24/2014   Procedure: TRANSESOPHAGEAL ECHOCARDIOGRAM (TEE);  Surgeon: Wendall Stade, MD;  Location: [facility] ENDOSCOPY;  Service: Cardiovascular;  Laterality: N/A;   TONSILLECTOMY     as child   Social History:  reports that he quit smoking about 9 years ago. His smoking use included pipe. He has never used smokeless tobacco. He reports that he does not drink alcohol and does not use drugs.  Allergies  Allergen Reactions   Penicillins Other (See Comments)    Pt has no personal reaction to this med, but his mother/brother do. Has patient had a PCN reaction causing immediate rash, facial/tongue/throat swelling, SOB or lightheadedness with hypotension: No Has patient had a PCN reaction causing severe rash involving mucus membranes or skin necrosis: No Has patient had a PCN reaction that required hospitalization No Has patient had a PCN reaction occurring within the last 10 years: No If all of the above answers are "NO", then may proceed with Cephalosporin    Family History  Problem Relation Age of Onset   Vision loss Father    Hypertension Father    Diabetes Father    Heart disease Father    Cancer Father        colon   Diabetes Mother    Hypertension Mother    Heart disease Mother    Dementia Mother    Cancer Mother        breast ca   Diabetes Sister    Hyperlipidemia Sister    Hypertension Sister    Diabetes Brother    Kidney disease Brother    Heart disease Brother     Prior to Admission medications   Medication Sig Start Date End Date Taking? Authorizing Provider  acetaminophen (TYLENOL) 500 MG tablet Take 500-1,000 mg by mouth every 6 (six) hours as needed for mild pain, moderate pain, fever or headache.   Yes [provider]  Alpha-Lipoic Acid 600  MG TABS Take 1 tablet by mouth daily.   Yes [provider]  atorvastatin (LIPITOR) 10 MG tablet Take 10 mg by mouth at bedtime. 12/13/20  Yes [provider]  Calcium Carbonate-Vitamin D 600-400 MG-UNIT tablet Take 1 tablet by mouth daily.   Yes [provider]  Cholecalciferol (VITAMIN D3 PO) Take 1 capsule by mouth daily.   Yes [provider]  DILT-XR 240 MG 24 hr capsule Take 240 mg by mouth daily. 10/28/21  Yes [provider]  doxepin (SINEQUAN) 75 MG capsule Take 150 mg by mouth at bedtime.    Yes [provider]  DULoxetine (CYMBALTA) 60 MG capsule Take 60 mg by mouth daily.   Yes [provider]  fenofibrate 160 MG tablet Take 160 mg by mouth daily.   Yes [provider]  Ferrous Sulfate (IRON PO) Take 1 tablet by mouth daily.   Yes [provider]  gabapentin (NEURONTIN) 800 MG tablet Take 800 mg by mouth 3 (three) times daily. 09/11/22  Yes [provider]  glipiZIDE (GLUCOTROL XL) 2.5 MG 24 hr tablet Take 2.5 mg by mouth in the morning and at bedtime.  05/16/17  Yes [provider]  Lactobacillus (PROBIOTIC ACIDOPHILUS PO) Take 1 tablet by mouth daily.   Yes [provider]  lamoTRIgine (LAMICTAL) 150 MG tablet Take 1 tablet (150 mg total) by mouth 2 (two) times daily. 03/11/22  Yes Dohmeier, Porfirio Mylar, MD  levETIRAcetam (KEPPRA) 750 MG tablet TAKE 2 TABLETS BY MOUTH 2 TIMES DAILY. PLEASE CALL AND MAKE OVERDUE APPT FOR FURTHER REFILLS. 12/02/22  Yes Dohmeier, Porfirio Mylar, MD  losartan (COZAAR) 25 MG tablet TAKE 1 TABLET (25 MG TOTAL) BY MOUTH 2 (TWO) TIMES DAILY. 12/25/22  Yes Hilty, Lisette Abu, MD  metFORMIN (GLUCOPHAGE) 1000 MG tablet Take 1,000 mg by mouth 2 (two) times daily. 12/18/20  Yes [provider]  Multiple Vitamin (MULTIVITAMIN WITH MINERALS) TABS Take 1 tablet by mouth daily.   Yes [provider]  naloxone (NARCAN) nasal spray 4 mg/0.1 mL Place 1 spray into the nose  once. 08/29/20  Yes [provider]  Omega-3 Fatty Acids (FISH OIL) 1000 MG CAPS Take 1,000 mg by mouth 2 (two) times a day.   Yes [provider]  OVER THE COUNTER MEDICATION Apply 1 Application topically See admin instructions. Apply Silver Alginate Dressing to Slow healing wound   Yes [provider]  oxyCODONE-acetaminophen (PERCOCET) 10-325 MG tablet Take 1 tablet by mouth. 02/13/23  Yes [provider]  PRADAXA 150 MG CAPS capsule TAKE 1 CAPSULE BY MOUTH TWICE A DAY 12/29/22  Yes Hilty, Lisette Abu, MD  gentamicin ointment (GARAMYCIN) 0.1 % Apply 1 Application topically. Patient not taking: Reported on 02/16/2023 01/05/23   [provider]  nitrofurantoin (MACRODANTIN) 100 MG capsule Take 100 mg by mouth 2 (two) times daily. Patient not taking: Reported on 02/16/2023 01/13/23   [provider]  XTAMPZA ER 27 MG C12A Take 1 capsule by mouth every 12 (twelve) hours. Patient not taking: Reported on 02/16/2023 09/12/22   [provider]    Physical Exam: Vitals:   02/16/23 0400 02/16/23 0455 02/16/23 0509 02/16/23 0730  BP: 102/67 108/76  119/74  Pulse: (!) 117 97 61 (!) 108  Resp: 19 (!) 24 13 (!) 25  Temp:  97.6 F (36.4 C)    TempSrc:  Oral    SpO2: 95% 94% 97% 97%  Weight:      Height:       Physical Exam Vitals and nursing note reviewed.  Constitutional:      General: He is awake. He is not in acute distress.    Appearance: He is well-developed. He is morbidly obese. He is ill-appearing.     Interventions: Nasal cannula in place.  HENT:     Head: Normocephalic.     Nose: No rhinorrhea.     Mouth/Throat:     Mouth: Mucous membranes are moist.  Eyes:     General: No scleral icterus.    Pupils: Pupils are equal, round, and reactive to light.  Neck:     Vascular: No JVD.  Cardiovascular:     Rate and Rhythm: Normal rate and regular rhythm.     Heart sounds: S1 normal and S2 normal.  Pulmonary:     Effort: No tachypnea.      Breath sounds: Rales present. No wheezing or rhonchi.  Abdominal:  General: Abdomen is protuberant. The ostomy site is clean. Bowel sounds are normal. There is no distension.     Palpations: Abdomen is soft.     Tenderness: There is no abdominal tenderness.  Musculoskeletal:     Cervical back: Neck supple.     Right lower leg: 1+ Pitting Edema present.     Left lower leg: 1+ Pitting Edema present.  Skin:    General: Skin is warm and dry.     Comments: Hemosiderin skin changes of lower extremities distally.  Neurological:     General: No focal deficit present.     Mental Status: He is oriented to person, place, and time and easily aroused.  Psychiatric:        Mood and Affect: Mood normal.        Behavior: Behavior normal. Behavior is cooperative.    Data Reviewed:  Results are pending, will review when available.  05/23/2014 TTE -------------------------------------------------------------------  LV EF: 65% -   70%   -------------------------------------------------------------------  Indications:     Atrial fibrillation - 427.31.   -------------------------------------------------------------------  History:  PMH:   Fever.  Risk factors:  Seizures. Sepsis.  Dyslipidemia.   -------------------------------------------------------------------  Study Conclusions   - Left ventricle: The cavity size was normal. There was mild    concentric hypertrophy. Systolic function was vigorous. The    estimated ejection fraction was in the range of 65% to 70%. Wall    motion was normal; there were no regional wall motion    abnormalities. The study was not technically sufficient to allow    evaluation of LV diastolic dysfunction due to atrial    fibrillation.  - Aortic valve: Trileaflet; normal thickness leaflets. There was no    regurgitation.  - Aortic root: The aortic root was normal in size.  - Mitral valve: Structurally normal valve. There was no    regurgitation.  -  Left atrium: The atrium was mildly dilated.  - Right ventricle: Systolic function was normal.  - Right atrium: The atrium was normal in size.  - Tricuspid valve: There was mild regurgitation.  - Pulmonic valve: There was no regurgitation.  - Pulmonary arteries: Systolic pressure was within the normal    range.  - Inferior vena cava: The vessel was normal in size.  - Pericardium, extracardiac: There was no pericardial effusion.   EKG: Vent. rate 113 BPM PR interval * ms QRS duration 110 ms QT/QTcB 339/465 ms P-R-T axes * -39 79 Atrial fibrillation Left axis deviation Low voltage, extremity and precordial leads Abnormal R-wave progression, late transition  Assessment and Plan: Principal Problem:   Acute respiratory failure with hypoxia and hypercapnia (HCC) In the setting of:   Volume overload Stepdown/inpatient Continue supplemental oxygen.   Sodium and fluid restriction. Continue furosemide 80 mg IVP daily. Monitor daily weights, intake and output. Continue losartan 25 mg p.o. daily. No beta-blocker due to acute decompensation. Check echocardiogram. Cardiology consult appreciated.  Active Problems:   PFO (patent foramen ovale) Check echocardiogram.    PAF (paroxysmal atrial fibrillation) (HCC) CHA?DS?-VASc Score Continue Pradaxa 150 mg p.o. daily. Continue diltiazem now at 300 mg p.o. daily. (Cardiology increased diltiazem from 240 mg).    History of DVT (deep vein thrombosis) On Pradaxa.    Hyperlipidemia Continue Lovaza twice daily. Continue atorvastatin 10 mg p.o. daily. Continue fenofibrate 160 mg p.o. daily.    Peripheral neuropathy Continue Cymbalta 60 mg p.o. daily. Continue gabapentin 300 mg p.o. 3 times daily.    Seizures (HCC)  Continue Keppra 1500 mg p.o. twice daily per Continue Lamictal 150 mg p.o. twice daily. Follow-up with neurology as an outpatient.    Obesity Current BMI 38.54 kg/m. Lifestyle modifications. Follow-up with closely  PCP.    Pressure injury of skin Continue local care.  Type 2 diabetes mellitus Continue glipizide 2.5 mg p.o. daily. Carbohydrate modified diet. CBG monitoring with RI SS. Check hemoglobin A1c.    Advance Care Planning:   Code Status: Full Code   Consults: Cardiology Merlyn Lot MD).  Family Communication:   Severity of Illness: The appropriate patient status for this patient is INPATIENT. Inpatient status is judged to be reasonable and necessary in order to provide the required intensity of service to ensure the patient's safety. The patient's presenting symptoms, physical exam findings, and initial radiographic and laboratory data in the context of their chronic comorbidities is felt to place them at high risk for further clinical deterioration. Furthermore, it is not anticipated that the patient will be medically stable for discharge from the hospital within 2 midnights of admission.   * I certify that at the point of admission it is my clinical judgment that the patient will require inpatient hospital care spanning beyond 2 midnights from the point of admission due to high intensity of service, high risk for further deterioration and high frequency of surveillance required.*  Author: Bobette Mo, MD 02/16/2023 8:12 AM  For on call review [email or web address].   This document was prepared using Dragon voice recognition software and may contain some unintended transcription errors.

## 2023-02-17 DIAGNOSIS — J9601 Acute respiratory failure with hypoxia: Secondary | ICD-10-CM | POA: Diagnosis not present

## 2023-02-17 DIAGNOSIS — J9602 Acute respiratory failure with hypercapnia: Secondary | ICD-10-CM | POA: Diagnosis not present

## 2023-02-17 LAB — GLUCOSE, CAPILLARY
Glucose-Capillary: 157 mg/dL — ABNORMAL HIGH (ref 70–99)
Glucose-Capillary: 158 mg/dL — ABNORMAL HIGH (ref 70–99)
Glucose-Capillary: 133 mg/dL — ABNORMAL HIGH (ref 70–99)
Glucose-Capillary: 138 mg/dL — ABNORMAL HIGH (ref 70–99)

## 2023-02-17 LAB — COMPREHENSIVE METABOLIC PANEL
ALT: 11 U/L (ref 0–44)
AST: 20 U/L (ref 15–41)
Albumin: 3.2 g/dL — ABNORMAL LOW (ref 3.5–5.0)
Alkaline Phosphatase: 36 U/L — ABNORMAL LOW (ref 38–126)
Anion gap: 11 (ref 5–15)
BUN: 18 mg/dL (ref 8–23)
CO2: 33 mmol/L — ABNORMAL HIGH (ref 22–32)
Calcium: 8.5 mg/dL — ABNORMAL LOW (ref 8.9–10.3)
Chloride: 92 mmol/L — ABNORMAL LOW (ref 98–111)
Creatinine, Ser: 0.61 mg/dL (ref 0.61–1.24)
GFR, Estimated: >60 mL/min (ref >60)
Glucose, Bld: 138 mg/dL — ABNORMAL HIGH (ref 70–99)
Potassium: 4.3 mmol/L (ref 3.5–5.1)
Sodium: 136 mmol/L (ref 135–145)
Total Bilirubin: 0.6 mg/dL (ref 0.3–1.2)
Total Protein: 7 g/dL (ref 6.5–8.1)

## 2023-02-17 LAB — HEPARIN LEVEL (UNFRACTIONATED): Heparin Unfractionated: <0.1 [IU]/mL — ABNORMAL LOW (ref 0.30–0.70)

## 2023-02-17 LAB — CBC
HCT: 42.6 % (ref 39.0–52.0)
Hemoglobin: 12.1 g/dL — ABNORMAL LOW (ref 13.0–17.0)
MCH: 26.8 pg (ref 26.0–34.0)
MCHC: 28.4 g/dL — ABNORMAL LOW (ref 30.0–36.0)
MCV: 94.2 fL (ref 80.0–100.0)
Platelets: 281 10*3/uL (ref 150–400)
RBC: 4.52 MIL/uL (ref 4.22–5.81)
RDW: 16.4 % — ABNORMAL HIGH (ref 11.5–15.5)
WBC: 8.8 10*3/uL (ref 4.0–10.5)
nRBC: 0 % (ref 0.0–0.2)

## 2023-02-17 LAB — URINE CULTURE

## 2023-02-17 LAB — HIV ANTIBODY (ROUTINE TESTING W REFLEX): HIV Screen 4th Generation wRfx: NONREACTIVE

## 2023-02-17 MED ORDER — ORAL CARE MOUTH RINSE: 15.0000 mL | OROMUCOSAL | Status: DC | PRN

## 2023-02-17 MED ORDER — DABIGATRAN ETEXILATE MESYLATE 75 MG PO CAPS
150.0000 mg | ORAL_CAPSULE | Freq: Two times a day (BID) | ORAL | Status: DC
  Administered 2023-02-17 – 2023-02-27 (×21): 150 mg via ORAL
  Filled 2023-02-17 (×2): qty 2
  Filled 2023-02-17 (×6): qty 1
  Filled 2023-02-17: qty 2
  Filled 2023-02-17 (×9): qty 1
  Filled 2023-02-17: qty 2
  Filled 2023-02-17: qty 1
  Filled 2023-02-17 (×2): qty 2

## 2023-02-17 NOTE — Progress Notes (Signed)
PHARMACY - ANTICOAGULATION CONSULT NOTE  Pharmacy Consult for IV heparin (PTA Pradaxa) Indication: atrial fibrillation  Allergies  Allergen Reactions   Penicillins Other (See Comments)    Pt has no personal reaction to this med, but his mother/brother do. Has patient had a PCN reaction causing immediate rash, facial/tongue/throat swelling, SOB or lightheadedness with hypotension: No Has patient had a PCN reaction causing severe rash involving mucus membranes or skin necrosis: No Has patient had a PCN reaction that required hospitalization No Has patient had a PCN reaction occurring within the last 10 years: No If all of the above answers are "NO", then may proceed with Cephalosporin    Patient Measurements: Height: 6\' 5"  (195.6 cm) Weight: (!) 148.4 kg (327 lb 2.6 oz) IBW/kg (Calculated) : 89.1 Heparin Dosing Weight: 122 kg  Vital Signs: Temp: 98.2 F (36.8 C) (10/08 0333) Temp Source: Axillary (10/08 0333) BP: 112/72 (10/08 0400) Pulse Rate: 100 (10/08 0500)  Labs: Recent Labs    02/16/23 0123 02/16/23 0827 02/17/23 0436  HGB 12.3*  --  12.1*  HCT 42.5  --  42.6  PLT 281  --  281  APTT 67*  --   --   LABPROT 16.5*  --   --   INR 1.3*  --   --   HEPARINUNFRC  --   --  <0.10*  CREATININE 0.65 0.57* 0.61    Estimated Creatinine Clearance: 137.1 mL/min (by C-G formula based on SCr of 0.61 mg/dL).   Medical History: Past Medical History:  Diagnosis Date   Arthritis    Atrial fibrillation (HCC)    a. Dx 05/2014 in setting of seizure, PNA. S/p TEE/DCCV.   Bowel perforation (HCC)    a. transvere and R colectomy and ileostomy in 12/25/13.   Deep vein thrombophlebitis of left leg (HCC)    Deep vein thrombosis of right lower extremity (HCC)    Depression    Diabetes mellitus without complication (HCC)    DVT (deep venous thrombosis) (HCC)    multiple times   Hyperlipidemia    Hyperplasia of prostate    Hypertension    Obesity    Peripheral neuropathy    PFO  (patent foramen ovale)    a. 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   Psoriasis    Seizures (HCC)    Venous stasis    Weakness of both legs     Medications:  Scheduled:   atorvastatin  10 mg Oral QHS   Chlorhexidine Gluconate Cloth  6 each Topical Daily   diltiazem  300 mg Oral Daily   DULoxetine  60 mg Oral Daily   fenofibrate  160 mg Oral Daily   furosemide  80 mg Intravenous BID   gabapentin  800 mg Oral TID   glipiZIDE  2.5 mg Oral BID WC   hydrocerin   Topical BID   insulin aspart  0-20 Units Subcutaneous TID WC   lamoTRIgine  150 mg Oral BID   levETIRAcetam  1,500 mg Oral BID   losartan  25 mg Oral BID   metFORMIN  1,000 mg Oral BID WC   multivitamin with minerals  1 tablet Oral Daily   omega-3 acid ethyl esters  1 g Oral BID   mouth rinse  15 mL Mouth Rinse 4 times per day   Infusions:   heparin 1,750 Units/hr (02/17/23 0500)    Assessment: 72 yo with heart failure exacerbation. Plan to transition off of PTA Pradaxa  while inpatient in anticipation of possible cardiac procedure to IV heparin. Patient received dose of Pradaxa this AM at 0917 so will not need to start the IV heparin until at least 12 hours after this dose. Baseline labs already done.   02/17/23 Heparin level < 0.1 with heparin gtt @ 1750 units/hr RN reports no interruption in therapy, no line issues No bleeding complications reported Hgb = 12.1, Pltc 281 SCr = 0.61  Goal of Therapy:  Heparin level 0.3-0.7 units/ml Monitor platelets by anticoagulation protocol: Yes   Plan:  Increase IV heparin to 2050 units/hr Check heparin level 8 hr after rate increase Daily heparin level & CBC  Katie Faraone, Joselyn Glassman, PharmD 02/17/2023,5:34 AM

## 2023-02-17 NOTE — Progress Notes (Signed)
[facility] Progress Note Patient: [name] NWG:[id number] DOB: 12-Jul-1950 DOA: 02/16/2023  DOS: the patient was seen and examined on 02/17/2023  Brief hospital course: PMH of PAF, PFO, DVT, HLD, HTN, seizure, ileostomy, obesity, neuropathy with chronic venous stasis present to the hospital with complaints of cough and shortness of breath.  Also had an episode of unresponsiveness at home. When EMS evaluated the patient they found that the patient had some brief episodes of apnea as well as oxygen saturation in 60s. Per family oxygenation has been an issue for last few days. Found to have acute on chronic diastolic CHF.  Cardiology following.  Receiving aggressive diuresis and BiPAP therapy.  Assessment and Plan: Acute respiratory failure with hypoxia and mild hypercapnia. Suspect presentation is mostly in the setting of hypoxia in the setting of acute diastolic CHF.  Does have hypercapnia on VBG.  Wife mentions that the patient does not have any evidence of sleep apnea. No evidence of bronchospasm at the time of my evaluation. Receiving IV diuresis. Continue BiPAP therapy nightly.  Acute HFpEF. Echocardiogram shows preserved EF. Receiving IV Lasix 20 mg twice daily. Will continue the same for now. Repeat chest x-ray tomorrow. Appreciate cardio consultation.  Will monitor.  Permanent A-fib. Patient does not have paroxysmal A-fib. Remains tachycardic for now. On Cardizem which is currently resume. Also on Pradaxa which is currently resumed.  HTN. Holding ARB. Using blood pressure for aggressive IV diuresis.  PAD. HLD. On Lipitor.  History of seizure disorder. On Keppra as well as on Lamictal. No recent seizures reported. Will monitor.  Chronic diabetic neuropathy. Chronic pain syndrome. On gabapentin.  Also on Cymbalta. Per PDMP review on Percocet 10/325 5 times a day.  Obesity Class 3 Body mass index is 38.8 kg/m.  Placing the pt at higher risk of poor  outcomes. Suspect that the patient is actually suffering from OSA and may benefit from outpatient sleep study given hypercarbia. Continue BiPAP nightly scheduled for now.  Type 2 diabetes mellitus, uncontrolled with hyperglycemia without long-term insulin use with neuropathy. Holding oral hypoglycemic agent.  Currently on sliding scale insulin.   Chronic venous stasis History of DVT. On Pradaxa.  I am unable to find patient's prior DVT history. Continue.  Subjective: Breathing better.  Hard of hearing.  No nausea no vomiting.  No chest pain.  No abdominal pain.  No diarrhea.  Physical Exam: General: in moderate distress, No Rash Cardiovascular: S1 and S2 Present, No Murmur Respiratory: Good respiratory effort, Bilateral Air entry present.  Basal crackles, occasional wheezes Abdomen: Bowel Sound present, No tenderness Extremities: Severe edema Neuro: Alert and oriented x3, no new focal deficit  Data Reviewed: I have Reviewed nursing notes, Vitals, and Lab results. Since last encounter, pertinent lab results CBC and BMP   . I have ordered test including CBC and BMP  .   Disposition: Status is: Inpatient Remains inpatient appropriate because: Needing aggressive IV diuresis.  dabigatran (PRADAXA) capsule 150 mg   Family Communication: Wife at bedside Level of care: Stepdown continue.  Needing BiPAP. Vitals:   02/17/23 1700 02/17/23 1752 02/17/23 1800 02/17/23 1818  BP:    (!) 145/78  Pulse: (!) 104 (!) 141 (!) 112 (!) 108  Resp: (!) 24 18 20 20   Temp:      TempSrc:      SpO2: 100% 100% 100% 100%  Weight:      Height:         Author: Lynden Oxford, MD 02/17/2023 6:50 PM  Please look on [email or web address] to find out who is on call.

## 2023-02-17 NOTE — Progress Notes (Signed)
Progress Note  Patient Name: [name] Date of Encounter: 02/17/2023 Primary Cardiologist: Chrystie Nose, MD   Subjective   Overnight patient is much or alert. Patient notes no CP, SOB, Palpitations.  Vital Signs    Vitals:   02/17/23 0431 02/17/23 0500 02/17/23 0600 02/17/23 0800  BP:   123/72   Pulse: 82 100 100   Resp: (!) 21 (!) 23 20   Temp:    98.1 F (36.7 C)  TempSrc:    Axillary  SpO2: 93% 95% 93%   Weight:  (!) 148.4 kg    Height:        Intake/Output Summary (Last 24 hours) at 02/17/2023 1008 Last data filed at 02/17/2023 0650 Gross per 24 hour  Intake 604.98 ml  Output 2820 ml  Net -2215.02 ml   Filed Weights   02/16/23 0147 02/17/23 0500  Weight: (!) 147.4 kg (!) 148.4 kg    Physical Exam   GEN: No acute distress.  Morbid obesity Neck: thick neck; unable to assess JVD Cardiac: IRIR tachycardia, no murmurs, rubs, or gallops.  Respiratory: Clear to auscultation bilaterally; decreased breath sounds GI: Soft, nontender, non-distended  MS: +1 edema  Labs   Telemetry: AF rates ~ 100 bpm   Chemistry Recent Labs  Lab 02/16/23 0123 02/16/23 0827 02/17/23 0436  NA 134* 134* 136  K 5.1 4.7 4.3  CL 96* 93* 92*  CO2 31 34* 33*  GLUCOSE 189* 202* 138*  BUN 17 16 18   CREATININE 0.65 0.57* 0.61  CALCIUM 8.5* 8.5* 8.5*  PROT 7.5  --  7.0  ALBUMIN 3.3*  --  3.2*  AST 13*  --  20  ALT 13  --  11  ALKPHOS 36*  --  36*  BILITOT 0.6  --  0.6  GFRNONAA >60 >60 >60  ANIONGAP 7 7 11      Hematology Recent Labs  Lab 02/16/23 0123 02/17/23 0436  WBC 8.2 8.8  RBC 4.47 4.52  HGB 12.3* 12.1*  HCT 42.5 42.6  MCV 95.1 94.2  MCH 27.5 26.8  MCHC 28.9* 28.4*  RDW 16.2* 16.4*  PLT 281 281    Cardiac EnzymesNo results for input(s): "TROPONINI" in the last 168 hours. No results for input(s): "TROPIPOC" in the last 168 hours.   BNP Recent Labs  Lab 02/16/23 0123  BNP 198.7*     DDimer No results for input(s): "DDIMER" in the last 168  hours.   Cardiac Studies   Cardiac Studies & Procedures       ECHOCARDIOGRAM  ECHOCARDIOGRAM COMPLETE 02/16/2023  Narrative ECHOCARDIOGRAM REPORT    Patient Name:   [name] Date of Exam: 02/16/2023 Medical Rec #:  [id number]      Height:       77.0 in Accession #:    [id number]     Weight:       325.0 lb Date of Birth:  10-Sep-1950      BSA:          2.749 m Patient Age:    72 years       BP:           129/69 mmHg Patient Gender: M              HR:           125 bpm. Exam Location:  Inpatient  Procedure: 3D Echo, Cardiac Doppler and Color Doppler  Indications:    CHF I50.9  History:  Patient has prior history of Echocardiogram examinations, most recent 05/23/2014. Arrythmias:Atrial Flutter, Signs/Symptoms:Dyspnea; Risk Factors:Dyslipidemia, Former Smoker and Hypertension. TEE 05/2014: Atrial septal aneurysm with likely small PFO.  Sonographer:    Dondra Prader RVT RCS Referring Phys: [phone] DAVID MANUEL ORTIZ   Sonographer Comments: HR 105-144 bpm Due to very very poor and limited windows, Definity deemed inappropriate to use at this time. Unable to obtain all picutures and measurements due to A-fib w/ rvr. Patient unable to follow directions IMPRESSIONS   1. Left ventricular ejection fraction, by estimation, is 60 to 65%. The left ventricle has normal function. The left ventricle has no regional wall motion abnormalities. Left ventricular diastolic parameters are indeterminate. 2. Right ventricular systolic function is normal. The right ventricular size is normal. 3. The mitral valve is normal in structure. No evidence of mitral valve regurgitation. No evidence of mitral stenosis. 4. The aortic valve is tricuspid. Aortic valve regurgitation is not visualized. No aortic stenosis is present.  FINDINGS Left Ventricle: Left ventricular ejection fraction, by estimation, is 60 to 65%. The left ventricle has normal function. The left ventricle has no regional wall  motion abnormalities. The left ventricular internal cavity size was normal in size. There is no left ventricular hypertrophy. Left ventricular diastolic function could not be evaluated due to atrial fibrillation. Left ventricular diastolic parameters are indeterminate.  Right Ventricle: The right ventricular size is normal. No increase in right ventricular wall thickness. Right ventricular systolic function is normal.  Left Atrium: Left atrial size was normal in size.  Right Atrium: Right atrial size was normal in size.  Pericardium: There is no evidence of pericardial effusion.  Mitral Valve: The mitral valve is normal in structure. No evidence of mitral valve regurgitation. No evidence of mitral valve stenosis.  Tricuspid Valve: The tricuspid valve is normal in structure. Tricuspid valve regurgitation is trivial. No evidence of tricuspid stenosis.  Aortic Valve: The aortic valve is tricuspid. Aortic valve regurgitation is not visualized. No aortic stenosis is present.  Pulmonic Valve: The pulmonic valve was normal in structure. Pulmonic valve regurgitation is not visualized. No evidence of pulmonic stenosis.  Aorta: The aortic root is normal in size and structure.  IAS/Shunts: No atrial level shunt detected by color flow Doppler.   LEFT VENTRICLE PLAX 2D LVOT diam:     2.20 cm LVOT Area:     3.80 cm   RIGHT VENTRICLE TAPSE (M-mode): 1.8 cm  PULMONIC VALVE AORTA                 PV Vmax:       0.87 m/s Ao Root diam: 3.50 cm PV Peak grad:  3.0 mmHg   TRICUSPID VALVE TR Peak grad:   12.2 mmHg TR Vmax:        175.00 cm/s  SHUNTS Systemic Diam: 2.20 cm  Chilton Si MD Electronically signed by Chilton Si MD Signature Date/Time: 02/16/2023/2:37:39 PM    Final                  Assessment & Plan    Acute Hypoxic and Hypercarbic Respiratory Failure (multi-factorial) Acute HFpEF - hypervolemic, continue  IV Lasix 80mg  twice daily.  - holding of SGLT2i;  may add MRA through course - Continue to monitor daily weights, strict I/Os, and renal function.   Permanent Atrial Fibrillation - On Diltiazem XR 300mg  daily - no procedures planned; will switch back to pradaxa   Hypertension with morbid obesity - ARB and CCB as above  PAD Hyperlipidemia - Continue Lipitor 10mg  daily.  - triphasic duplex pulses; no change in therapy at this time   Otherwise, per primary team: - Type 2 diabetes mellitus - Severe peripheral neuropathy - S/p colectomy and ileostomy  - Seizure disorder - Multiple DVT - Chronic pain - Sacral decubitus ulcer      For questions or updates, please contact [facility] Please consult [email or web address] for contact info under Cardiology/STEMI.      Riley Lam, MD FASE [facility] Cardiologist [facility]  [address] [place], [address] [place], [place] [address] [phone]  10:08 AM

## 2023-02-17 NOTE — Progress Notes (Signed)
   02/17/23 0431  BiPAP/CPAP/SIPAP  BiPAP/CPAP/SIPAP Pt Type Adult   RT Note Pt. seen on rounds, had removed V60 Bipap just prior to my arrival, refused to wear any further, placed on previous HFNC(Salter device) at 5 lpm with 94=-95% Oxygen saturations.

## 2023-02-17 NOTE — Progress Notes (Signed)
   02/17/23 2337  BiPAP/CPAP/SIPAP  BiPAP/CPAP/SIPAP Pt Type Adult  BiPAP/CPAP/SIPAP V60  Reason BIPAP/CPAP not in use Other(comment)   Pt. seen on rounds, V60 BiPAP remains at bedside, declined placement at this time, pt. currently 99% on room air while in no apparent distress, A/OX3, RN made aware, RN/RT to monitor.

## 2023-02-17 NOTE — Progress Notes (Signed)
Heart Failure Navigator Progress Note  Assessed for Heart & Vascular TOC clinic readiness.  Patient does not meet criteria due to EF 60-65%, per MD note patient is bed bound. .   Navigator will sign off at this time.   Rhae Hammock, BSN, Scientist, clinical (histocompatibility and immunogenetics) Only

## 2023-02-17 NOTE — Plan of Care (Signed)
  Problem: Coping: Goal: Ability to adjust to condition or change in health will improve Outcome: Progressing   Problem: Fluid Volume: Goal: Ability to maintain a balanced intake and output will improve Outcome: Progressing   Problem: Education: Goal: Knowledge of General Education information will improve Description: Including pain rating scale, medication(s)/side effects and non-pharmacologic comfort measures Outcome: Progressing   

## 2023-02-18 DIAGNOSIS — J9602 Acute respiratory failure with hypercapnia: Secondary | ICD-10-CM | POA: Diagnosis not present

## 2023-02-18 DIAGNOSIS — J9601 Acute respiratory failure with hypoxia: Secondary | ICD-10-CM | POA: Diagnosis not present

## 2023-02-18 LAB — GLUCOSE, CAPILLARY
Glucose-Capillary: 140 mg/dL — ABNORMAL HIGH (ref 70–99)
Glucose-Capillary: 158 mg/dL — ABNORMAL HIGH (ref 70–99)
Glucose-Capillary: 141 mg/dL — ABNORMAL HIGH (ref 70–99)
Glucose-Capillary: 148 mg/dL — ABNORMAL HIGH (ref 70–99)

## 2023-02-18 LAB — BASIC METABOLIC PANEL
Anion gap: 12 (ref 5–15)
BUN: 21 mg/dL (ref 8–23)
CO2: 32 mmol/L (ref 22–32)
Calcium: 8.5 mg/dL — ABNORMAL LOW (ref 8.9–10.3)
Chloride: 90 mmol/L — ABNORMAL LOW (ref 98–111)
Creatinine, Ser: 0.64 mg/dL (ref 0.61–1.24)
GFR, Estimated: >60 mL/min (ref >60)
Glucose, Bld: 125 mg/dL — ABNORMAL HIGH (ref 70–99)
Potassium: 3.4 mmol/L — ABNORMAL LOW (ref 3.5–5.1)
Sodium: 134 mmol/L — ABNORMAL LOW (ref 135–145)

## 2023-02-18 LAB — CBC
HCT: 39.6 % (ref 39.0–52.0)
Hemoglobin: 11.7 g/dL — ABNORMAL LOW (ref 13.0–17.0)
MCH: 26.9 pg (ref 26.0–34.0)
MCHC: 29.5 g/dL — ABNORMAL LOW (ref 30.0–36.0)
MCV: 91 fL (ref 80.0–100.0)
Platelets: 280 10*3/uL (ref 150–400)
RBC: 4.35 MIL/uL (ref 4.22–5.81)
RDW: 16.5 % — ABNORMAL HIGH (ref 11.5–15.5)
WBC: 8.8 10*3/uL (ref 4.0–10.5)
nRBC: 0 % (ref 0.0–0.2)

## 2023-02-18 LAB — MAGNESIUM: Magnesium: 2 mg/dL (ref 1.7–2.4)

## 2023-02-18 MED ORDER — FUROSEMIDE 10 MG/ML IJ SOLN
120.0000 mg | Freq: Two times a day (BID) | INTRAVENOUS | Status: DC
  Administered 2023-02-18 – 2023-02-23 (×9): 120 mg via INTRAVENOUS
  Filled 2023-02-18: qty 10
  Filled 2023-02-18: qty 12
  Filled 2023-02-18: qty 10
  Filled 2023-02-18: qty 12
  Filled 2023-02-18 (×2): qty 10
  Filled 2023-02-18: qty 12
  Filled 2023-02-18 (×3): qty 10
  Filled 2023-02-18: qty 12
  Filled 2023-02-18 (×2): qty 10

## 2023-02-18 MED ORDER — POTASSIUM CHLORIDE CRYS ER 20 MEQ PO TBCR
60.0000 meq | EXTENDED_RELEASE_TABLET | Freq: Two times a day (BID) | ORAL | Status: AC
  Administered 2023-02-18 – 2023-02-20 (×4): 60 meq via ORAL
  Filled 2023-02-18: qty 3
  Filled 2023-02-18: qty 6
  Filled 2023-02-18 (×2): qty 3

## 2023-02-18 MED ORDER — POTASSIUM CHLORIDE CRYS ER 20 MEQ PO TBCR: 60.0000 meq | EXTENDED_RELEASE_TABLET | Freq: Two times a day (BID) | ORAL | Status: DC

## 2023-02-18 MED ORDER — POTASSIUM CHLORIDE CRYS ER 20 MEQ PO TBCR
40.0000 meq | EXTENDED_RELEASE_TABLET | Freq: Two times a day (BID) | ORAL | Status: DC
  Administered 2023-02-18: 40 meq via ORAL
  Filled 2023-02-18: qty 2

## 2023-02-18 NOTE — Progress Notes (Signed)
PROGRESS NOTE    [name]  ZOX:[id number] DOB: [birth year] DOA: 02/16/2023 PCP: Caffie Damme, MD   Brief Narrative: [name] is a 72 y.o. 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  Patient presented secondary to shortness of breath and was found to have respiratory failure secondary to acute heart failure. Cardiology consulted. IV diuresis started.   Assessment and Plan:  Acute respiratory failure with hypoxia and hypercapnia Appears to be secondary to acute heart failure.  Patient has been weaned to room air.  Resolved.  Acute heart failure with preserved EF Cardiology consulted.  Patient started on Lasix IV diuresis for management.  Echocardiogram significant for an LVEF of 60 to 65% with indeterminate diastolic parameters and no regional wall motion abnormality.  Urine output of 2.8 L over the last 4 hours with one unmeasured urine occurrence.  -Cardiology recommendations: Increasing to Lasix 120 mg IV twice daily  Permanent atrial fibrillation Intermittent rapid ventricular rate.  Patient is on diltiazem and Pradaxa as an outpatient. -Continue diltiazem 200 mg daily -Continue Pradaxa  Primary hypertension Patient is on diltiazem and losartan as an outpatient.  Losartan held secondary to aggressive diuresis. -Continue diltiazem  Diabetes mellitus type 2 Poorly controlled with hyperglycemia and hemoglobin A1c of 8.0%.  Patient is managed on glipizide as an outpatient, which were held on admission.  Sliding scale insulin initiated on admission. -Continue sliding scale insulin  Seizure disorder Patient is on Lamictal and Keppra as an outpatient.-Continue Lamictal and Keppra  Peripheral neuropathy Chronic pain Patient is on gabapentin and Cymbalta as an  outpatient. -Continue gabapentin and Cymbalta  Obesity Estimated body mass index is 38.8 kg/m as calculated from the following:   Height as of this encounter: 6\' 5"  (1.956 m).   Weight as of this encounter: 148.4 kg.  History of DVT -Continue Pradaxa  Hyperlipidemia Lipitor and omega-3 fatty acid  Pressure injury Left buttocks. Present on admission.     DVT prophylaxis: Pradaxa Code Status:   Code Status: Full Code Family Communication: None at bedside. Called sister but no answer Disposition Plan: Home likely in 2 to 3 days pending continued cardiology recommendations   Consultants:  Cardiology  Procedures:  Transthoracic Echocardiogram  Antimicrobials: None    Subjective: Patient reports breathing better. Coughing is better.  Objective: BP (!) 93/46   Pulse 85   Temp 98.1 F (36.7 C) (Oral)   Resp 20   Ht 6\' 5"  (1.956 m)   Wt (!) 148.4 kg   SpO2 93%   BMI 38.80 kg/m   Examination:  General exam: Appears calm and comfortable. Lying flat in bed. Respiratory system: Diminished but clear to auscultation. Respiratory effort normal. Cardiovascular system: S1 & S2 heard, Irregular rhythm, normal rate. Gastrointestinal system: Abdomen is obese, soft and nontender. Normal bowel sounds heard. Two ostomy bags noted. Central nervous system: Alert and oriented. No focal neurological deficits. Musculoskeletal: BLE edema. No calf tenderness Psychiatry: Judgement and insight appear normal. Mood & affect appropriate.    Data Reviewed: I have personally reviewed following labs and imaging studies  CBC Lab Results  Component Value Date   WBC 8.8 02/18/2023   RBC 4.35 02/18/2023   HGB 11.7 (L) 02/18/2023   HCT 39.6 02/18/2023   MCV 91.0 02/18/2023   MCH 26.9 02/18/2023   PLT 280 02/18/2023   MCHC 29.5 (L)  02/18/2023   RDW 16.5 (H) 02/18/2023   LYMPHSABS 0.8 02/16/2023   MONOABS 0.6 02/16/2023   EOSABS 0.3 02/16/2023   BASOSABS 0.0 02/16/2023     Last  metabolic panel Lab Results  Component Value Date   NA 134 (L) 02/18/2023   K 3.4 (L) 02/18/2023   CL 90 (L) 02/18/2023   CO2 32 02/18/2023   BUN 21 02/18/2023   CREATININE 0.64 02/18/2023   GLUCOSE 125 (H) 02/18/2023   GFRNONAA >60 02/18/2023   GFRAA 128 11/26/2016   CALCIUM 8.5 (L) 02/18/2023   PHOS 2.4 (L) 02/16/2023   PROT 7.0 02/17/2023   ALBUMIN 3.2 (L) 02/17/2023   LABGLOB 3.1 11/26/2016   AGRATIO 1.1 (L) 11/26/2016   BILITOT 0.6 02/17/2023   ALKPHOS 36 (L) 02/17/2023   AST 20 02/17/2023   ALT 11 02/17/2023   ANIONGAP 12 02/18/2023    GFR: Estimated Creatinine Clearance: 137.1 mL/min (by C-G formula based on SCr of 0.64 mg/dL).  Recent Results (from the past 240 hour(s))  Blood Culture (routine x 2)     Status: None (Preliminary result)   Collection Time: 02/16/23  1:45 AM   Specimen: BLOOD RIGHT HAND  Result Value Ref Range Status   Specimen Description BLOOD RIGHT HAND  Final   Special Requests   Final    BOTTLES DRAWN AEROBIC AND ANAEROBIC Blood Culture adequate volume   Culture   Final    NO GROWTH 2 DAYS Performed at [facility], [address], [place], [place] [address]    Report Status PENDING  Incomplete  Blood Culture (routine x 2)     Status: None (Preliminary result)   Collection Time: 02/16/23  2:04 AM   Specimen: BLOOD  Result Value Ref Range Status   Specimen Description BLOOD SITE NOT SPECIFIED  Final   Special Requests   Final    BOTTLES DRAWN AEROBIC AND ANAEROBIC Blood Culture results may not be optimal due to an excessive volume of blood received in culture bottles   Culture   Final    NO GROWTH 2 DAYS Performed at [facility], [address], [place], [place] [address]    Report Status PENDING  Incomplete  Urine Culture     Status: Abnormal   Collection Time: 02/16/23  8:27 AM   Specimen: Urine, Random  Result Value Ref Range Status   Specimen Description   Final    URINE, RANDOM Performed at [facility], [address] [place], [place] [address]    Special Requests   Final    NONE Reflexed from [id number] Performed at [facility], [address] [place], [place] [address]    Culture MULTIPLE SPECIES PRESENT, SUGGEST RECOLLECTION (A)  Final   Report Status 02/17/2023 FINAL  Final  MRSA Next Gen by PCR, Nasal     Status: None   Collection Time: 02/16/23 12:05 PM   Specimen: Nasal Mucosa; Nasal Swab  Result Value Ref Range Status   MRSA by PCR Next Gen NOT DETECTED NOT DETECTED Final    Comment: (NOTE) The GeneXpert MRSA Assay (FDA approved for NASAL specimens only), is one component of a comprehensive MRSA colonization surveillance program. It is not intended to diagnose MRSA infection nor to guide or monitor treatment for MRSA infections. Test performance is not FDA approved in patients less than 17 years old. Performed at [facility], [address], [place], [place] [address]  Radiology Studies: CT Angio Chest Pulmonary Embolism (PE) W or WO Contrast  Result Date: 02/16/2023 CLINICAL DATA:  Pulmonary embolism (PE) suspected, low to intermediate prob, neg D-dimer Shortness of breath. EXAM: CT ANGIOGRAPHY CHEST WITH CONTRAST TECHNIQUE: Multidetector CT imaging of the chest was performed using the standard protocol during bolus administration of intravenous contrast. Multiplanar CT image reconstructions and MIPs were obtained to evaluate the vascular anatomy. RADIATION DOSE REDUCTION: This exam was performed according to the departmental dose-optimization program which includes automated exposure control, adjustment of the mA and/or kV according to patient size and/or use of iterative reconstruction technique. CONTRAST:  80mL OMNIPAQUE IOHEXOL 350 MG/ML SOLN COMPARISON:  Radiograph earlier today FINDINGS: Cardiovascular: Motion artifact limitations. Evaluation of the pulmonary arteries are diagnostic to the proximal segmental level.  Allowing for this limitation there is no central pulmonary embolus. The heart is enlarged. Aortic atherosclerosis and tortuosity. There are coronary artery calcifications no pericardial effusion. Mediastinum/Nodes: Small mediastinal lymph nodes not enlarged by size criteria. No definite hilar adenopathy. No esophageal wall thickening. No thyroid nodule. Lungs/Pleura: Small to moderate right and diminutive left pleural effusion. There is associated compressive atelectasis. Question of faint ground-glass opacities in the left upper lung zone. Motion artifact limits more detailed assessment. Retained mucus in the trachea. Calcified granuloma in the right lower lobe. Upper Abdomen: Suspected hepatic steatosis. Low-density lesion in the spleen, unchanged from 09/28/2022 abdominal CT. No further follow-up imaging is recommended. Musculoskeletal: Mild compression deformities of T4 and T5. Technically age indeterminate these are likely chronic. No focal bone lesion. No chest wall soft tissue abnormality. Review of the MIP images confirms the above findings. IMPRESSION: 1. Motion artifact limitations. Allowing for this limitation there is no central pulmonary embolus. 2. Small to moderate right and diminutive left pleural effusion with associated compressive atelectasis. Question of faint ground-glass opacities in the left upper lung zone, may be pulmonary edema, infectious or inflammatory. 3. Cardiomegaly. Coronary artery calcifications. Aortic Atherosclerosis (ICD10-I70.0). Electronically Signed   By: Narda Rutherford M.D.   On: 02/16/2023 17:26   VAS [place] ABI WITH/WO TBI  Result Date: 02/16/2023  LOWER EXTREMITY DOPPLER STUDY Patient Name:  MANSEL STROTHER  Date of Exam:   02/16/2023 Medical Rec #: [id number]       Accession #:    [id number] Date of Birth: 28-Jun-1950       Patient Gender: M Patient Age:   20 years Exam Location:  [facility] Procedure:      VAS [place] ABI WITH/WO TBI Referring Phys: CALLIE GOODRICH  --------------------------------------------------------------------------------  Indications: Rest pain. High Risk Factors: Hypertension, hyperlipidemia, Diabetes.  Limitations: Today's exam was limited due to involuntary patient movement. Comparison Study: No prior study Performing Technologist: Shona Simpson  Examination Guidelines: A complete evaluation includes at minimum, Doppler waveform signals and systolic blood pressure reading at the level of bilateral brachial, anterior tibial, and posterior tibial arteries, when vessel segments are accessible. Bilateral testing is considered an integral part of a complete examination. Photoelectric Plethysmograph (PPG) waveforms and toe systolic pressure readings are included as required and additional duplex testing as needed. Limited examinations for reoccurring indications may be performed as noted.  ABI Findings: +--------+------------------+-----+---------+--------+ Right   Rt Pressure (mmHg)IndexWaveform Comment  +--------+------------------+-----+---------+--------+ [id number]                    triphasic         +--------+------------------+-----+---------+--------+ PTA     207  1.36 biphasic          +--------+------------------+-----+---------+--------+ DP      201               1.32 triphasic         +--------+------------------+-----+---------+--------+ +--------+------------------+-----+---------+-------+ Left    Lt Pressure (mmHg)IndexWaveform Comment +--------+------------------+-----+---------+-------+ [id number]                    triphasic        +--------+------------------+-----+---------+-------+ PTA     178               1.17 biphasic         +--------+------------------+-----+---------+-------+ DP      170               1.12 biphasic         +--------+------------------+-----+---------+-------+ +-------+-----------+-----------+------------+------------+ ABI/TBIToday's ABIToday's  TBIPrevious ABIPrevious TBI +-------+-----------+-----------+------------+------------+ Right  1.36                                           +-------+-----------+-----------+------------+------------+ Left   1.17                                           +-------+-----------+-----------+------------+------------+ Unable to assess Great Toe blood flow, due to misshaped toes.  Summary: Right: Resting right ankle-brachial index indicates noncompressible right lower extremity arteries. Possibly clacified, compression was present but at high pressures. Left: Resting left ankle-brachial index is within normal range. *See table(s) above for measurements and observations.  Electronically signed by Gerarda Fraction on 02/16/2023 at 4:49:47 PM.    Final    ECHOCARDIOGRAM COMPLETE  Result Date: 02/16/2023    ECHOCARDIOGRAM REPORT   Patient Name:   DEYTON ELLENBECKER Date of Exam: 02/16/2023 Medical Rec #:  [id number]      Height:       77.0 in Accession #:    [id number]     Weight:       325.0 lb Date of Birth:  [birth year]      BSA:          2.749 m Patient Age:    72 years       BP:           129/69 mmHg Patient Gender: M              HR:           125 bpm. Exam Location:  Inpatient Procedure: 3D Echo, Cardiac Doppler and Color Doppler Indications:    CHF I50.9  History:        Patient has prior history of Echocardiogram examinations, most                 recent 05/23/2014. Arrythmias:Atrial Flutter,                 Signs/Symptoms:Dyspnea; Risk Factors:Dyslipidemia, Former Smoker                 and Hypertension. 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  Sonographer:    Dondra Prader RVT RCS Referring Phys: [phone] DAVID MANUEL ORTIZ  Sonographer Comments: HR 105-144 bpm Due to very very poor and limited  windows, Definity deemed inappropriate to use at this time. Unable to obtain all picutures and measurements due to A-fib w/ rvr. Patient unable to follow directions IMPRESSIONS  1. Left  ventricular ejection fraction, by estimation, is 60 to 65%. The left ventricle has normal function. The left ventricle has no regional wall motion abnormalities. Left ventricular diastolic parameters are indeterminate.  2. Right ventricular systolic function is normal. The right ventricular size is normal.  3. The mitral valve is normal in structure. No evidence of mitral valve regurgitation. No evidence of mitral stenosis.  4. The aortic valve is tricuspid. Aortic valve regurgitation is not visualized. No aortic stenosis is present. FINDINGS  Left Ventricle: Left ventricular ejection fraction, by estimation, is 60 to 65%. The left ventricle has normal function. The left ventricle has no regional wall motion abnormalities. The left ventricular internal cavity size was normal in size. There is  no left ventricular hypertrophy. Left ventricular diastolic function could not be evaluated due to atrial fibrillation. Left ventricular diastolic parameters are indeterminate. Right Ventricle: The right ventricular size is normal. No increase in right ventricular wall thickness. Right ventricular systolic function is normal. Left Atrium: Left atrial size was normal in size. Right Atrium: Right atrial size was normal in size. Pericardium: There is no evidence of pericardial effusion. Mitral Valve: The mitral valve is normal in structure. No evidence of mitral valve regurgitation. No evidence of mitral valve stenosis. Tricuspid Valve: The tricuspid valve is normal in structure. Tricuspid valve regurgitation is trivial. No evidence of tricuspid stenosis. Aortic Valve: The aortic valve is tricuspid. Aortic valve regurgitation is not visualized. No aortic stenosis is present. Pulmonic Valve: The pulmonic valve was normal in structure. Pulmonic valve regurgitation is not visualized. No evidence of pulmonic stenosis. Aorta: The aortic root is normal in size and structure. IAS/Shunts: No atrial level shunt detected by color flow  Doppler.  LEFT VENTRICLE PLAX 2D LVOT diam:     2.20 cm LVOT Area:     3.80 cm  RIGHT VENTRICLE TAPSE (M-mode): 1.8 cm                       PULMONIC VALVE AORTA                 PV Vmax:       0.87 m/s Ao Root diam: 3.50 cm PV Peak grad:  3.0 mmHg  TRICUSPID VALVE TR Peak grad:   12.2 mmHg TR Vmax:        175.00 cm/s  SHUNTS Systemic Diam: 2.20 cm Chilton Si MD Electronically signed by Chilton Si MD Signature Date/Time: 02/16/2023/2:37:39 PM    Final       LOS: 2 days    Jacquelin Hawking, MD [facility] 02/18/2023, 8:49 AM   If 7PM-7AM, please contact night-coverage [email or web address]

## 2023-02-18 NOTE — Hospital Course (Signed)
[name] is a 72 y.o. 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  Patient presented secondary to shortness of breath and was found to have respiratory failure secondary to acute heart failure. Cardiology consulted. IV diuresis started.

## 2023-02-18 NOTE — Progress Notes (Addendum)
Rounding Note    Patient Name: [name] Date of Encounter: 02/18/2023  [facility] Cardiologist: Chrystie Nose, MD   Subjective   No acute overnight events. He is more alert this morning than when I saw him 2 days ago but his sister states he still has moments where his is lethargic. He denies any chest pain or significant shortness of breath. Maintaining O2 sats in the mid 90s on room air. His main complaint this morning is leg pain. He states he is having some aching in his legs that feels a little different than his chronic neuropathic pain.   Inpatient Medications    Scheduled Meds:  atorvastatin  10 mg Oral QHS   Chlorhexidine Gluconate Cloth  6 each Topical Daily   dabigatran  150 mg Oral Q12H   diltiazem  300 mg Oral Daily   DULoxetine  60 mg Oral Daily   fenofibrate  160 mg Oral Daily   furosemide  80 mg Intravenous BID   gabapentin  800 mg Oral TID   hydrocerin   Topical BID   insulin aspart  0-20 Units Subcutaneous TID WC   lamoTRIgine  150 mg Oral BID   levETIRAcetam  1,500 mg Oral BID   multivitamin with minerals  1 tablet Oral Daily   omega-3 acid ethyl esters  1 g Oral BID   Continuous Infusions:  PRN Meds: acetaminophen **OR** acetaminophen, HYDROmorphone (DILAUDID) injection, ondansetron **OR** ondansetron (ZOFRAN) IV, mouth rinse   Vital Signs    Vitals:   02/18/23 0426 02/18/23 0600 02/18/23 0650 02/18/23 0800  BP:  (!) 93/46    Pulse:  97 85   Resp:  20 20   Temp: 98.1 F (36.7 C)   98.1 F (36.7 C)  TempSrc: Oral   Oral  SpO2:  95% 93%   Weight:      Height:        Intake/Output Summary (Last 24 hours) at 02/18/2023 1008 Last data filed at 02/18/2023 0541 Gross per 24 hour  Intake 1271.89 ml  Output 2470 ml  Net -1198.11 ml      02/18/2023    4:00 AM 02/17/2023    5:00 AM 02/16/2023    1:47 AM  Last 3 Weights  Weight (lbs) 327 lb 2.6 oz 327 lb 2.6 oz 325 lb  Weight (kg) 148.4 kg 148.4 kg 147.419 kg      Telemetry     Atrial fibrillation with rates mostly in the 90s to 110s but occasionally as high as the 120s to 130s. Occasional PVCs. - Personally Reviewed  ECG    No new ECG tracing today. - Personally Reviewed  Physical Exam   GEN: Morbidly obese Caucasian male resting comfortably in  no acute distress.   Neck: Unable to assess JVD due to body habitus. Cardiac: Tachycardic with irregularly irregular rhythm. No murmurs, rubs, or gallops.  Respiratory: Clear to auscultation bilaterally. No wheezes, rhonchi, or rales appreciated. GI: Soft, obese, and non-distended.  MS: 1+ pitting edema of bilateral lower extremities with chronic skin changes consistent with venous stasis. Neuro:  No focal deficits. Psych: Normal affect.  Labs    High Sensitivity Troponin:  No results for input(s): "TROPONINIHS" in the last 720 hours.   Chemistry Recent Labs  Lab 02/16/23 0123 02/16/23 0827 02/17/23 0436 02/18/23 0311  NA 134* 134* 136 134*  K 5.1 4.7 4.3 3.4*  CL 96* 93* 92* 90*  CO2 31 34* 33* 32  GLUCOSE 189* 202*  138* 125*  BUN 17 16 18 21   CREATININE 0.65 0.57* 0.61 0.64  CALCIUM 8.5* 8.5* 8.5* 8.5*  MG  --  1.9  --   --   PROT 7.5  --  7.0  --   ALBUMIN 3.3*  --  3.2*  --   AST 13*  --  20  --   ALT 13  --  11  --   ALKPHOS 36*  --  36*  --   BILITOT 0.6  --  0.6  --   GFRNONAA >60 >60 >60 >60  ANIONGAP 7 7 11 12     Lipids No results for input(s): "CHOL", "TRIG", "HDL", "LABVLDL", "LDLCALC", "CHOLHDL" in the last 168 hours.  Hematology Recent Labs  Lab 02/16/23 0123 02/17/23 0436 02/18/23 0311  WBC 8.2 8.8 8.8  RBC 4.47 4.52 4.35  HGB 12.3* 12.1* 11.7*  HCT 42.5 42.6 39.6  MCV 95.1 94.2 91.0  MCH 27.5 26.8 26.9  MCHC 28.9* 28.4* 29.5*  RDW 16.2* 16.4* 16.5*  PLT 281 281 280   Thyroid No results for input(s): "TSH", "FREET4" in the last 168 hours.  BNP Recent Labs  Lab 02/16/23 0123  BNP 198.7*    DDimer No results for input(s): "DDIMER" in the last 168 hours.    Radiology    CT Angio Chest Pulmonary Embolism (PE) W or WO Contrast  Result Date: 02/16/2023 CLINICAL DATA:  Pulmonary embolism (PE) suspected, low to intermediate prob, neg D-dimer Shortness of breath. EXAM: CT ANGIOGRAPHY CHEST WITH CONTRAST TECHNIQUE: Multidetector CT imaging of the chest was performed using the standard protocol during bolus administration of intravenous contrast. Multiplanar CT image reconstructions and MIPs were obtained to evaluate the vascular anatomy. RADIATION DOSE REDUCTION: This exam was performed according to the departmental dose-optimization program which includes automated exposure control, adjustment of the mA and/or kV according to patient size and/or use of iterative reconstruction technique. CONTRAST:  80mL OMNIPAQUE IOHEXOL 350 MG/ML SOLN COMPARISON:  Radiograph earlier today FINDINGS: Cardiovascular: Motion artifact limitations. Evaluation of the pulmonary arteries are diagnostic to the proximal segmental level. Allowing for this limitation there is no central pulmonary embolus. The heart is enlarged. Aortic atherosclerosis and tortuosity. There are coronary artery calcifications no pericardial effusion. Mediastinum/Nodes: Small mediastinal lymph nodes not enlarged by size criteria. No definite hilar adenopathy. No esophageal wall thickening. No thyroid nodule. Lungs/Pleura: Small to moderate right and diminutive left pleural effusion. There is associated compressive atelectasis. Question of faint ground-glass opacities in the left upper lung zone. Motion artifact limits more detailed assessment. Retained mucus in the trachea. Calcified granuloma in the right lower lobe. Upper Abdomen: Suspected hepatic steatosis. Low-density lesion in the spleen, unchanged from 09/28/2022 abdominal CT. No further follow-up imaging is recommended. Musculoskeletal: Mild compression deformities of T4 and T5. Technically age indeterminate these are likely chronic. No focal bone lesion. No  chest wall soft tissue abnormality. Review of the MIP images confirms the above findings. IMPRESSION: 1. Motion artifact limitations. Allowing for this limitation there is no central pulmonary embolus. 2. Small to moderate right and diminutive left pleural effusion with associated compressive atelectasis. Question of faint ground-glass opacities in the left upper lung zone, may be pulmonary edema, infectious or inflammatory. 3. Cardiomegaly. Coronary artery calcifications. Aortic Atherosclerosis (ICD10-I70.0). Electronically Signed   By: Narda Rutherford M.D.   On: 02/16/2023 17:26   VAS [place] ABI WITH/WO TBI  Result Date: 02/16/2023  LOWER EXTREMITY DOPPLER STUDY Patient Name:  [name]  Date of Exam:  02/16/2023 Medical Rec #: [id number]       Accession #:    [id number] Date of Birth: November 12, 1950       Patient Gender: M Patient Age:   50 years Exam Location:  [facility] Procedure:      VAS [place] ABI WITH/WO TBI Referring Phys: CALLIE GOODRICH --------------------------------------------------------------------------------  Indications: Rest pain. High Risk Factors: Hypertension, hyperlipidemia, Diabetes.  Limitations: Today's exam was limited due to involuntary patient movement. Comparison Study: No prior study Performing Technologist: Shona Simpson  Examination Guidelines: A complete evaluation includes at minimum, Doppler waveform signals and systolic blood pressure reading at the level of bilateral brachial, anterior tibial, and posterior tibial arteries, when vessel segments are accessible. Bilateral testing is considered an integral part of a complete examination. Photoelectric Plethysmograph (PPG) waveforms and toe systolic pressure readings are included as required and additional duplex testing as needed. Limited examinations for reoccurring indications may be performed as noted.  ABI Findings: +--------+------------------+-----+---------+--------+ Right   Rt Pressure (mmHg)IndexWaveform  Comment  +--------+------------------+-----+---------+--------+ [id number]                    triphasic         +--------+------------------+-----+---------+--------+ PTA     207               1.36 biphasic          +--------+------------------+-----+---------+--------+ DP      201               1.32 triphasic         +--------+------------------+-----+---------+--------+ +--------+------------------+-----+---------+-------+ Left    Lt Pressure (mmHg)IndexWaveform Comment +--------+------------------+-----+---------+-------+ [id number]                    triphasic        +--------+------------------+-----+---------+-------+ PTA     178               1.17 biphasic         +--------+------------------+-----+---------+-------+ DP      170               1.12 biphasic         +--------+------------------+-----+---------+-------+ +-------+-----------+-----------+------------+------------+ ABI/TBIToday's ABIToday's TBIPrevious ABIPrevious TBI +-------+-----------+-----------+------------+------------+ Right  1.36                                           +-------+-----------+-----------+------------+------------+ Left   1.17                                           +-------+-----------+-----------+------------+------------+ Unable to assess Great Toe blood flow, due to misshaped toes.  Summary: Right: Resting right ankle-brachial index indicates noncompressible right lower extremity arteries. Possibly clacified, compression was present but at high pressures. Left: Resting left ankle-brachial index is within normal range. *See table(s) above for measurements and observations.  Electronically signed by Gerarda Fraction on 02/16/2023 at 4:49:47 PM.    Final    ECHOCARDIOGRAM COMPLETE  Result Date: 02/16/2023    ECHOCARDIOGRAM REPORT   Patient Name:   [name] Date of Exam: 02/16/2023 Medical Rec #:  [id number]      Height:       77.0 in Accession #:     [id number]     Weight:  325.0 lb Date of Birth:  April 30, 1951      BSA:          2.749 m Patient Age:    72 years       BP:           129/69 mmHg Patient Gender: M              HR:           125 bpm. Exam Location:  Inpatient Procedure: 3D Echo, Cardiac Doppler and Color Doppler Indications:    CHF I50.9  History:        Patient has prior history of Echocardiogram examinations, most                 recent 05/23/2014. Arrythmias:Atrial Flutter,                 Signs/Symptoms:Dyspnea; Risk Factors:Dyslipidemia, Former Smoker                 and Hypertension. 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  Sonographer:    Dondra Prader RVT RCS Referring Phys: [phone] DAVID MANUEL ORTIZ  Sonographer Comments: HR 105-144 bpm Due to very very poor and limited windows, Definity deemed inappropriate to use at this time. Unable to obtain all picutures and measurements due to A-fib w/ rvr. Patient unable to follow directions IMPRESSIONS  1. Left ventricular ejection fraction, by estimation, is 60 to 65%. The left ventricle has normal function. The left ventricle has no regional wall motion abnormalities. Left ventricular diastolic parameters are indeterminate.  2. Right ventricular systolic function is normal. The right ventricular size is normal.  3. The mitral valve is normal in structure. No evidence of mitral valve regurgitation. No evidence of mitral stenosis.  4. The aortic valve is tricuspid. Aortic valve regurgitation is not visualized. No aortic stenosis is present. FINDINGS  Left Ventricle: Left ventricular ejection fraction, by estimation, is 60 to 65%. The left ventricle has normal function. The left ventricle has no regional wall motion abnormalities. The left ventricular internal cavity size was normal in size. There is  no left ventricular hypertrophy. Left ventricular diastolic function could not be evaluated due to atrial fibrillation. Left ventricular diastolic parameters are  indeterminate. Right Ventricle: The right ventricular size is normal. No increase in right ventricular wall thickness. Right ventricular systolic function is normal. Left Atrium: Left atrial size was normal in size. Right Atrium: Right atrial size was normal in size. Pericardium: There is no evidence of pericardial effusion. Mitral Valve: The mitral valve is normal in structure. No evidence of mitral valve regurgitation. No evidence of mitral valve stenosis. Tricuspid Valve: The tricuspid valve is normal in structure. Tricuspid valve regurgitation is trivial. No evidence of tricuspid stenosis. Aortic Valve: The aortic valve is tricuspid. Aortic valve regurgitation is not visualized. No aortic stenosis is present. Pulmonic Valve: The pulmonic valve was normal in structure. Pulmonic valve regurgitation is not visualized. No evidence of pulmonic stenosis. Aorta: The aortic root is normal in size and structure. IAS/Shunts: No atrial level shunt detected by color flow Doppler.  LEFT VENTRICLE PLAX 2D LVOT diam:     2.20 cm LVOT Area:     3.80 cm  RIGHT VENTRICLE TAPSE (M-mode): 1.8 cm                       PULMONIC VALVE  AORTA                 PV Vmax:       0.87 m/s Ao Root diam: 3.50 cm PV Peak grad:  3.0 mmHg  TRICUSPID VALVE TR Peak grad:   12.2 mmHg TR Vmax:        175.00 cm/s  SHUNTS Systemic Diam: 2.20 cm Chilton Si MD Electronically signed by Chilton Si MD Signature Date/Time: 02/16/2023/2:37:39 PM    Final     Cardiac Studies   Echocardiogram 02/16/2023: 1. Left ventricular ejection fraction, by estimation, is 60 to 65%. The  left ventricle has normal function. The left ventricle has no regional  wall motion abnormalities. Left ventricular diastolic parameters are  indeterminate.   2. Right ventricular systolic function is normal. The right ventricular  size is normal.   3. The mitral valve is normal in structure. No evidence of mitral valve  regurgitation. No evidence of mitral stenosis.    4. The aortic valve is tricuspid. Aortic valve regurgitation is not  visualized. No aortic stenosis is present.  _______________  ABIs 02/16/2023: Summary:  Right: Resting right ankle-brachial index indicates noncompressible right  lower extremity arteries.   Possibly clacified, compression was present but at high pressures.  Left: Resting left ankle-brachial index is within normal range.    Patient Profile     72 y.o. 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 Robb Matar.   Assessment & Plan    Acute Hypoxic and Hypercarbic Respiratory Failure Patient presented to the ED after sister found him unresponsive.  HE was noted to be severely hypoxic on arrival of EMS with O2 sats as low as the 60s. Venous blood gas in the ED showed pH of 7.31, pCO2 of 73, pO2 56, Bicarb 36.8. BNP mildly elevated at 198. Chest x-ray showed cardiopericardial enlargement with hazy appearance of the bilateral chest and interstitial opacities felt to be consistent with CHF. He was initially on BiPAP but now is on 4L of O2 via HFNC. - See recommendations for CHF as above.  - Otherwise, per primary team. Suspect respiratory failure is multifactorial (deconditioning, BMI, suspected obstructive sleep apnea/ obesity hypoventilation syndrome, and CHF). He would benefit from outpatient sleep study.   Acute HFpEF Patient presented to the ED after sister found him unresponsive.  HE was noted to be severely hypoxic on arrival of EMS with O2 sats as low as the 60s. Venous blood gas in the ED showed pH of 7.31, pCO2 of 73, pO2 56, Bicarb 36.8. BNP mildly elevated  at 198. Chest x-ray showed cardiopericardial enlargement with hazy appearance of the bilateral chest and interstitial opacities felt to be consistent with CHF. Echo showed  LVEF of 60-65% with normal wall motion, normal RV size and function, and no significant valvular disease. He was started on IV Lasix. Documented urinary output of 2.9 L yesterday and net negative 4L this admission. Renal function stable.  - It is very difficult to assess volume status given his body habitus. Given renal function is stable, would continue diuresis with IV Lasix 80mg  twice daily. - Will hold off on starting Spironolactone right now given soft BP at  times but hopefully can add this at a later time. - Would avoid SGLT2 inhibitors given he is bedbound and prone to UTIs. - Continue to monitor daily weights, strict I/Os, and renal function.   Persistent Atrial Fibrillation Patient has a history of long-standing persistent atrial fibrillation. Rates as high as the 140s on admission. - Rates mostly in the 90s to 110s but occasionally in the 120s to 130s. - Continue Diltiazem CD 300mg  daily. Unable to uptitrate this due to soft BP and need for ongoing diuresis.  - Will discuss MD about the potential of using Amiodarone for rate control if rates remain elevated. - Continue home Pradaxa 150mg  twice daily.   Hypertension BP soft at times but stable. - Continue Diltiazem CD 300mg  daily. - Continue to hold Losartan given soft BP to allow for additional rate control and diuresis. .   Hyperlipidemia - Continue Lipitor 10mg  daily.   PAD ABIs this admission showed non-compressible arteries on the right but were normal on the left. - Continue Pradaxa and Lipitor as above.  Hypokalemia Potassium 3.4 today.  - Will replete. - Will recheck Magnesium level.   Otherwise, per primary team: - Type 2 diabetes mellitus - Severe peripheral neuropathy - S/p colectomy and ileostomy  - Seizure disorder - Multiple DVT - Chronic  pain - Sacral decubitus ulcer    For questions or updates, please contact [facility] Please consult [email or web address] for contact info under        Signed, Corrin Parker, PA-C  02/18/2023, 10:08 AM    Personally seen and examined. Agree with APP above with the following comments:  Patient notes worsening cough but improved leg swelling.  Asymptomatic from AF.  Not OOB.  Exam notable for  - tachycardia with IRIR rhythm and distant heart sounds, Rhonchi bilaterally, +1 edema to both thighs, leg wounds are well dressed; bilateral boots placed, morbidly obese, rest as above  Would recommend  - will increase to lasix 120 mg IV BID - Will increase K as well - Pending Mg Level - discussed protein calorie malnutrition with family, albumin ~ 3.2 - Ok for nutritional supplements - will work to get as close to euvolemic as he will tolerate  Riley Lam, MD FASE [facility] Cardiologist [facility]  [address] [place], [address] [place], [place] [address] [phone]  1:16 PM

## 2023-02-18 NOTE — Plan of Care (Signed)
  Problem: Coping: Goal: Ability to adjust to condition or change in health will improve Outcome: Progressing   Problem: Fluid Volume: Goal: Ability to maintain a balanced intake and output will improve Outcome: Progressing   Problem: Health Behavior/Discharge Planning: Goal: Ability to manage health-related needs will improve Outcome: Progressing   Problem: Metabolic: Goal: Ability to maintain appropriate glucose levels will improve Outcome: Progressing   Problem: Nutritional: Goal: Maintenance of adequate nutrition will improve Outcome: Progressing   Problem: Skin Integrity: Goal: Risk for impaired skin integrity will decrease Outcome: Progressing   Problem: Tissue Perfusion: Goal: Adequacy of tissue perfusion will improve Outcome: Progressing   Problem: Education: Goal: Knowledge of General Education information will improve Description: Including pain rating scale, medication(s)/side effects and non-pharmacologic comfort measures Outcome: Progressing   Problem: Health Behavior/Discharge Planning: Goal: Ability to manage health-related needs will improve Outcome: Progressing   Problem: Clinical Measurements: Goal: Will remain free from infection Outcome: Progressing Goal: Respiratory complications will improve Outcome: Progressing Goal: Cardiovascular complication will be avoided Outcome: Progressing   Problem: Activity: Goal: Risk for activity intolerance will decrease Outcome: Progressing   Problem: Coping: Goal: Level of anxiety will decrease Outcome: Progressing   Problem: Elimination: Goal: Will not experience complications related to bowel motility Outcome: Progressing   Problem: Pain Managment: Goal: General experience of comfort will improve Outcome: Progressing   Problem: Safety: Goal: Ability to remain free from injury will improve Outcome: Progressing   Problem: Skin Integrity: Goal: Risk for impaired skin integrity will  decrease Outcome: Progressing

## 2023-02-18 NOTE — Plan of Care (Signed)
Patient refused BIPAP during the night but wakeup alert this morning.   Problem: Clinical Measurements: Goal: Will remain free from infection Outcome: Progressing Goal: Respiratory complications will improve Outcome: Progressing Goal: Cardiovascular complication will be avoided Outcome: Progressing   Problem: Nutrition: Goal: Adequate nutrition will be maintained Outcome: Progressing

## 2023-02-19 DIAGNOSIS — J9601 Acute respiratory failure with hypoxia: Secondary | ICD-10-CM | POA: Diagnosis not present

## 2023-02-19 DIAGNOSIS — J9602 Acute respiratory failure with hypercapnia: Secondary | ICD-10-CM | POA: Diagnosis not present

## 2023-02-19 LAB — BASIC METABOLIC PANEL
Anion gap: 12 (ref 5–15)
BUN: 21 mg/dL (ref 8–23)
CO2: 32 mmol/L (ref 22–32)
Calcium: 8.4 mg/dL — ABNORMAL LOW (ref 8.9–10.3)
Chloride: 91 mmol/L — ABNORMAL LOW (ref 98–111)
Creatinine, Ser: 0.76 mg/dL (ref 0.61–1.24)
GFR, Estimated: >60 mL/min (ref >60)
Glucose, Bld: 139 mg/dL — ABNORMAL HIGH (ref 70–99)
Potassium: 3.8 mmol/L (ref 3.5–5.1)
Sodium: 135 mmol/L (ref 135–145)

## 2023-02-19 LAB — CBC
HCT: 41.1 % (ref 39.0–52.0)
Hemoglobin: 12.2 g/dL — ABNORMAL LOW (ref 13.0–17.0)
MCH: 26.7 pg (ref 26.0–34.0)
MCHC: 29.7 g/dL — ABNORMAL LOW (ref 30.0–36.0)
MCV: 89.9 fL (ref 80.0–100.0)
Platelets: 257 10*3/uL (ref 150–400)
RBC: 4.57 MIL/uL (ref 4.22–5.81)
RDW: 16.7 % — ABNORMAL HIGH (ref 11.5–15.5)
WBC: 8 10*3/uL (ref 4.0–10.5)
nRBC: 0 % (ref 0.0–0.2)

## 2023-02-19 LAB — GLUCOSE, CAPILLARY
Glucose-Capillary: 156 mg/dL — ABNORMAL HIGH (ref 70–99)
Glucose-Capillary: 219 mg/dL — ABNORMAL HIGH (ref 70–99)
Glucose-Capillary: 131 mg/dL — ABNORMAL HIGH (ref 70–99)
Glucose-Capillary: 172 mg/dL — ABNORMAL HIGH (ref 70–99)

## 2023-02-19 LAB — MAGNESIUM: Magnesium: 2 mg/dL (ref 1.7–2.4)

## 2023-02-19 MED ORDER — HYDROCODONE-ACETAMINOPHEN 10-325 MG PO TABS
1.0000 | ORAL_TABLET | ORAL | Status: DC | PRN
  Administered 2023-02-19 – 2023-02-27 (×25): 1 via ORAL
  Filled 2023-02-19 (×28): qty 1

## 2023-02-19 MED ORDER — OXYCODONE-ACETAMINOPHEN 10-325 MG PO TABS: 1.0000 | ORAL_TABLET | ORAL | Status: DC | PRN

## 2023-02-19 MED ORDER — HYDROMORPHONE HCL 1 MG/ML IJ SOLN
1.0000 mg | Freq: Once | INTRAMUSCULAR | Status: AC
  Administered 2023-02-19: 1 mg via INTRAVENOUS
  Filled 2023-02-19: qty 1

## 2023-02-19 NOTE — TOC Initial Note (Signed)
Transition of Care [facility]) - Initial/Assessment Note    Patient Details  Name: [name] MRN: [id number] Date of Birth: 06/17/1950  Transition of Care [facility]) CM/SW Contact:    Otelia Santee, LCSW Phone Number: 02/19/2023, 10:46 AM  Clinical Narrative:                 Met with pt and sister in room. Pt is bed bound at baseline. Pt shares he has a hospital bed at home. Pt does not receive home care services and states that his sister is his caregiver. Pt currently requiring O2. TOC will follow for DC need. Pt will require PTAR for transportation home at discharge.   Expected Discharge Plan: Home/Self Care Barriers to Discharge: Continued Medical Work up   Patient Goals and CMS Choice Patient states their goals for this hospitalization and ongoing recovery are:: To return home CMS Medicare.gov Compare Post Acute Care list provided to::  (NA) Choice offered to / list presented to :  (NA) [place] ownership interest in [facility].provided to::  (NA)    Expected Discharge Plan and Services In-house Referral: Clinical Social Work Discharge Planning Services: NA Post Acute Care Choice: Durable Medical Equipment Living arrangements for the past 2 months: Single Family Home                                      Prior Living Arrangements/Services Living arrangements for the past 2 months: Single Family Home Lives with:: Siblings Patient language and need for interpreter reviewed:: Yes Do you feel safe going back to the place where you live?: Yes      Need for Family Participation in Patient Care: No (Comment) Care giver support system in place?: Yes (comment) Current home services: DME (Hospital bed) Criminal Activity/Legal Involvement Pertinent to Current Situation/Hospitalization: No - Comment as needed  Activities of Daily Living      Permission Sought/Granted Permission sought to share information with : Family Supports Permission granted to share  information with : Yes, Verbal Permission Granted  Share Information with NAME: Limmie Schoenberg     Permission granted to share info w Relationship: Sister  Permission granted to share info w Contact Information: [phone]  Emotional Assessment Appearance:: Appears older than stated age Attitude/Demeanor/Rapport: Engaged Affect (typically observed): Accepting Orientation: : Oriented to Self, Oriented to Place, Oriented to  Time, Oriented to Situation Alcohol / Substance Use: Not Applicable Psych Involvement: No (comment)  Admission diagnosis:  Acute respiratory failure with hypoxia (HCC) [J96.01] Acute respiratory failure with hypoxia and hypercapnia (HCC) [J96.01, J96.02] Heart failure, unspecified HF chronicity, unspecified heart failure type (HCC) [I50.9] Patient Active Problem List   Diagnosis Date Noted   Acute respiratory failure with hypoxia and hypercapnia (HCC) 02/16/2023   Pressure injury of skin 02/16/2023   Type 2 diabetes mellitus (HCC) 02/16/2023   UTI (urinary tract infection) 01/05/2022   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   Venous stasis 07/11/2016   Lower extremity edema 07/11/2016   Epilepsy without status epilepticus, not intractable (HCC) 07/05/2014   Absence epileptic syndrome, not intractable, with status epilepticus (HCC) 07/05/2014   Neuropathy involving both lower extremities 07/05/2014   PFO (patent foramen ovale)    PAF (paroxysmal atrial fibrillation) (HCC)    Obesity    Chronic anticoagulation 05/23/2014   Atrial fibrillation with rapid ventricular response (HCC) 05/22/2014   Seizures (HCC)  05/20/2014   Acute respiratory failure with hypoxia (HCC) 05/20/2014   Sepsis (HCC) 01/31/2014   Lower urinary tract infectious disease 01/30/2014   Fever 01/30/2014   Hyperglycemia 01/30/2014   Free intraperitoneal air 12/25/2013   Perforated viscus 12/25/2013   Vitamin B 12 deficiency  12/18/2013   Colon abnormality 12/18/2013   Peripheral neuropathy 09/05/2013   Hyperlipidemia    Bimalleolar ankle fracture 09/01/2013   History of DVT (deep vein thrombosis) 09/01/2013   Ankle fracture 09/01/2013   Fracture of ankle, closed 09/01/2013   Internal hemorrhoids 10/06/2012   External hemorrhoids 10/06/2012   Anal skin tag 10/06/2012   PCP:  Caffie Damme, MD Pharmacy:   [id number] [place], [place] - [address] [place] [address] Phone: [phone] Fax: [phone]     Social Determinants of Health (SDOH) Social History: SDOH Screenings   Tobacco Use: Medium Risk (02/16/2023)   SDOH Interventions:     Readmission Risk Interventions    02/19/2023   10:43 AM 01/07/2022   11:09 AM  Readmission Risk Prevention Plan  Post Dischage Appt  Complete  Medication Screening  Complete  Transportation Screening Complete Complete  PCP or Specialist Appt within 5-7 Days Complete   Home Care Screening Complete   Medication Review (RN CM) Complete

## 2023-02-19 NOTE — Plan of Care (Signed)
  Problem: Coping: Goal: Ability to adjust to condition or change in health will improve Outcome: Progressing   Problem: Fluid Volume: Goal: Ability to maintain a balanced intake and output will improve Outcome: Progressing   Problem: Metabolic: Goal: Ability to maintain appropriate glucose levels will improve Outcome: Progressing   Problem: Clinical Measurements: Goal: Ability to maintain clinical measurements within normal limits will improve Outcome: Progressing   Problem: Pain Managment: Goal: General experience of comfort will improve Outcome: Progressing   Problem: Safety: Goal: Ability to remain free from injury will improve Outcome: Progressing

## 2023-02-19 NOTE — Progress Notes (Signed)
   02/19/23 2311  BiPAP/CPAP/SIPAP  BiPAP/CPAP/SIPAP Pt Type Adult  Reason BIPAP/CPAP not in use Other(comment) (PT refused, seemed confused.)

## 2023-02-19 NOTE — Progress Notes (Signed)
PROGRESS NOTE    [name]  XBM:[id number] DOB: 01-05-1951 DOA: 02/16/2023 PCP: Caffie Damme, MD   Brief Narrative: [name] is a 72 y.o. 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  Patient presented secondary to shortness of breath and was found to have respiratory failure secondary to acute heart failure. Cardiology consulted. IV diuresis started.   Assessment and Plan:  Acute respiratory failure with hypoxia and hypercapnia Appears to be secondary to acute heart failure.  Patient has been weaned to room air.  Resolved.  Acute heart failure with preserved EF Cardiology consulted.  Patient started on Lasix IV diuresis for management.  Echocardiogram significant for an LVEF of 60 to 65% with indeterminate diastolic parameters and no regional wall motion abnormality.  Urine output of 4.7 L over the last 24 hours. Weight of 327 lbs on admission. Weight of 322.5 lbs today. -Cardiology recommendations: Lasix 120 mg IV twice daily with potassium supplementation  Permanent atrial fibrillation Intermittent rapid ventricular rate.  Patient is on diltiazem and Pradaxa as an outpatient. -Continue diltiazem 200 mg daily -Continue Pradaxa  Primary hypertension Patient is on diltiazem and losartan as an outpatient.  Losartan held secondary to aggressive diuresis. -Continue diltiazem  Diabetes mellitus type 2 Poorly controlled with hyperglycemia and hemoglobin A1c of 8.0%.  Patient is managed on glipizide as an outpatient, which were held on admission.  Sliding scale insulin initiated on admission. -Continue sliding scale insulin  Seizure disorder Patient is on Lamictal and Keppra as an outpatient.-Continue Lamictal and Keppra  Peripheral neuropathy Chronic pain Patient is  on gabapentin and Cymbalta as an outpatient. -Continue gabapentin and Cymbalta  Obesity Estimated body mass index is 38.25 kg/m as calculated from the following:   Height as of this encounter: 6\' 5"  (1.956 m).   Weight as of this encounter: 146.3 kg.  History of DVT -Continue Pradaxa  Hyperlipidemia Lipitor and omega-3 fatty acid  Pressure injury Left buttocks. Present on admission.     DVT prophylaxis: Pradaxa Code Status:   Code Status: Full Code Family Communication: Sister at bedside. Disposition Plan: Home likely in 2 to 3 days pending continued cardiology recommendations   Consultants:  Cardiology  Procedures:  Transthoracic Echocardiogram  Antimicrobials: None    Subjective: Buttock pain and lower leg pain. No other concerns.  Objective: BP (!) 125/58   Pulse (!) 101   Temp 97.6 F (36.4 C) (Oral)   Resp 18   Ht 6\' 5"  (1.956 m)   Wt (!) 146.3 kg   SpO2 94%   BMI 38.25 kg/m   Examination:  General exam: Appears calm and comfortable Respiratory system: Diminished. Respiratory effort normal. Cardiovascular system: S1 & S2 heard, irregular rhythm, normal rate.  Gastrointestinal system: Abdomen is nondistended, soft and nontender.  Normal bowel sounds heard. Central nervous system: Alert and oriented. No focal neurological deficits. Musculoskeletal: BLE edema. No calf tenderness Skin: No cyanosis. No rashes Psychiatry: Judgement and insight appear normal. Mood & affect appropriate.    Data Reviewed: I have personally reviewed following labs and imaging studies  CBC Lab Results  Component Value Date   WBC 8.0 02/19/2023   RBC 4.57 02/19/2023   HGB 12.2 (L) 02/19/2023   HCT 41.1 02/19/2023   MCV 89.9 02/19/2023   MCH 26.7 02/19/2023   PLT 257 02/19/2023   MCHC 29.7 (L) 02/19/2023  RDW 16.7 (H) 02/19/2023   LYMPHSABS 0.8 02/16/2023   MONOABS 0.6 02/16/2023   EOSABS 0.3 02/16/2023   BASOSABS 0.0 02/16/2023     Last metabolic  panel Lab Results  Component Value Date   NA 135 02/19/2023   K 3.8 02/19/2023   CL 91 (L) 02/19/2023   CO2 32 02/19/2023   BUN 21 02/19/2023   CREATININE 0.76 02/19/2023   GLUCOSE 139 (H) 02/19/2023   GFRNONAA >60 02/19/2023   GFRAA 128 11/26/2016   CALCIUM 8.4 (L) 02/19/2023   PHOS 2.4 (L) 02/16/2023   PROT 7.0 02/17/2023   ALBUMIN 3.2 (L) 02/17/2023   LABGLOB 3.1 11/26/2016   AGRATIO 1.1 (L) 11/26/2016   BILITOT 0.6 02/17/2023   ALKPHOS 36 (L) 02/17/2023   AST 20 02/17/2023   ALT 11 02/17/2023   ANIONGAP 12 02/19/2023    GFR: Estimated Creatinine Clearance: 136.1 mL/min (by C-G formula based on SCr of 0.76 mg/dL).  Recent Results (from the past 240 hour(s))  Blood Culture (routine x 2)     Status: None (Preliminary result)   Collection Time: 02/16/23  1:45 AM   Specimen: BLOOD RIGHT HAND  Result Value Ref Range Status   Specimen Description BLOOD RIGHT HAND  Final   Special Requests   Final    BOTTLES DRAWN AEROBIC AND ANAEROBIC Blood Culture adequate volume   Culture   Final    NO GROWTH 2 DAYS Performed at [facility], [address], [place], [place] [address]    Report Status PENDING  Incomplete  Blood Culture (routine x 2)     Status: None (Preliminary result)   Collection Time: 02/16/23  2:04 AM   Specimen: BLOOD  Result Value Ref Range Status   Specimen Description BLOOD SITE NOT SPECIFIED  Final   Special Requests   Final    BOTTLES DRAWN AEROBIC AND ANAEROBIC Blood Culture results may not be optimal due to an excessive volume of blood received in culture bottles   Culture   Final    NO GROWTH 2 DAYS Performed at [facility], [address] [place], [place] [address]    Report Status PENDING  Incomplete  Urine Culture     Status: Abnormal   Collection Time: 02/16/23  8:27 AM   Specimen: Urine, Random  Result Value Ref Range Status   Specimen Description   Final    URINE, RANDOM Performed at [facility], [address], [place], [place] [address]    Special Requests   Final    NONE Reflexed from [id number] Performed at [facility], [address], [place], [place] [address]    Culture MULTIPLE SPECIES PRESENT, SUGGEST RECOLLECTION (A)  Final   Report Status 02/17/2023 FINAL  Final  MRSA Next Gen by PCR, Nasal     Status: None   Collection Time: 02/16/23 12:05 PM   Specimen: Nasal Mucosa; Nasal Swab  Result Value Ref Range Status   MRSA by PCR Next Gen NOT DETECTED NOT DETECTED Final    Comment: (NOTE) The GeneXpert MRSA Assay (FDA approved for NASAL specimens only), is one component of a comprehensive MRSA colonization surveillance program. It is not intended to diagnose MRSA infection nor to guide or monitor treatment for MRSA infections. Test performance is not FDA approved in patients less than 61 years old. Performed at [facility], [address] [place], [place] [address]       Radiology Studies: No  results found.    LOS: 3 days    Bruce Hawking, MD [facility] 02/19/2023, 2:38 PM   If 7PM-7AM, please contact night-coverage [email or web address]

## 2023-02-19 NOTE — Progress Notes (Signed)
Rounding Note    Patient Name: [name] Date of Encounter: 02/19/2023  [facility] Cardiologist: Chrystie Nose, MD   Subjective   Left unit in interim.  He is doing well save for pain control; still on O2.  Inpatient Medications    Scheduled Meds:  atorvastatin  10 mg Oral QHS   dabigatran  150 mg Oral Q12H   diltiazem  300 mg Oral Daily   DULoxetine  60 mg Oral Daily   fenofibrate  160 mg Oral Daily   gabapentin  800 mg Oral TID   hydrocerin   Topical BID   insulin aspart  0-20 Units Subcutaneous TID WC   lamoTRIgine  150 mg Oral BID   levETIRAcetam  1,500 mg Oral BID   multivitamin with minerals  1 tablet Oral Daily   omega-3 acid ethyl esters  1 g Oral BID   potassium chloride  60 mEq Oral BID   Continuous Infusions:  furosemide 120 mg (02/19/23 1043)   PRN Meds: acetaminophen **OR** acetaminophen, ondansetron **OR** ondansetron (ZOFRAN) IV, mouth rinse   Vital Signs    Vitals:   02/18/23 1940 02/19/23 0300 02/19/23 0508 02/19/23 0847  BP: (!) 131/59  122/81 136/83  Pulse: (!) 107  68 99  Resp: 19  20 18   Temp: 97.7 F (36.5 C)   97.8 F (36.6 C)  TempSrc: Oral   Oral  SpO2: 93%  92% 92%  Weight:  (!) 146.3 kg    Height:        Intake/Output Summary (Last 24 hours) at 02/19/2023 1151 Last data filed at 02/19/2023 1043 Gross per 24 hour  Intake 840 ml  Output 5125 ml  Net -4285 ml      02/19/2023    3:00 AM 02/18/2023    4:00 AM 02/17/2023    5:00 AM  Last 3 Weights  Weight (lbs) 322 lb 8.5 oz 327 lb 2.6 oz 327 lb 2.6 oz  Weight (kg) 146.3 kg 148.4 kg 148.4 kg      Telemetry    AF- rate controlled ~ 107- Personally Reviewed   Physical Exam   GEN: Morbidly obese Caucasian male resting comfortably in  no acute distress.   Neck: Unable to assess JVD due to body habitus. Cardiac: Tachycardic with irregularly irregular rhythm. No murmurs, rubs, or gallops.  Respiratory: Clear to auscultation bilaterally. No wheezes,  rhonchi, or rales appreciated. GI: Soft, obese, and non-distended.  MS: +1 painful  pitting edema of bilateral lower extremities with chronic skin changes consistent with venous stasis. Neuro:  No focal deficits. Psych: Normal affect.  Labs    High Sensitivity Troponin:  No results for input(s): "TROPONINIHS" in the last 720 hours.   Chemistry Recent Labs  Lab 02/16/23 0123 02/16/23 0827 02/17/23 0436 02/18/23 0311 02/19/23 0523  NA 134* 134* 136 134* 135  K 5.1 4.7 4.3 3.4* 3.8  CL 96* 93* 92* 90* 91*  CO2 31 34* 33* 32 32  GLUCOSE 189* 202* 138* 125* 139*  BUN 17 16 18 21 21   CREATININE 0.65 0.57* 0.61 0.64 0.76  CALCIUM 8.5* 8.5* 8.5* 8.5* 8.4*  MG  --  1.9  --  2.0 2.0  PROT 7.5  --  7.0  --   --   ALBUMIN 3.3*  --  3.2*  --   --   AST 13*  --  20  --   --   ALT 13  --  11  --   --  ALKPHOS 36*  --  36*  --   --   BILITOT 0.6  --  0.6  --   --   GFRNONAA >60 >60 >60 >60 >60  ANIONGAP 7 7 11 12 12     Lipids No results for input(s): "CHOL", "TRIG", "HDL", "LABVLDL", "LDLCALC", "CHOLHDL" in the last 168 hours.  Hematology Recent Labs  Lab 02/17/23 0436 02/18/23 0311 02/19/23 0523  WBC 8.8 8.8 8.0  RBC 4.52 4.35 4.57  HGB 12.1* 11.7* 12.2*  HCT 42.6 39.6 41.1  MCV 94.2 91.0 89.9  MCH 26.8 26.9 26.7  MCHC 28.4* 29.5* 29.7*  RDW 16.4* 16.5* 16.7*  PLT 281 280 257   Thyroid No results for input(s): "TSH", "FREET4" in the last 168 hours.  BNP Recent Labs  Lab 02/16/23 0123  BNP 198.7*    DDimer No results for input(s): "DDIMER" in the last 168 hours.   Radiology    No results found.  Cardiac Studies   Echocardiogram 02/16/2023: 1. Left ventricular ejection fraction, by estimation, is 60 to 65%. The  left ventricle has normal function. The left ventricle has no regional  wall motion abnormalities. Left ventricular diastolic parameters are  indeterminate.   2. Right ventricular systolic function is normal. The right ventricular  size is normal.   3.  The mitral valve is normal in structure. No evidence of mitral valve  regurgitation. No evidence of mitral stenosis.   4. The aortic valve is tricuspid. Aortic valve regurgitation is not  visualized. No aortic stenosis is present.  _______________  ABIs 02/16/2023: Summary:  Right: Resting right ankle-brachial index indicates noncompressible right  lower extremity arteries.   Possibly clacified, compression was present but at high pressures.  Left: Resting left ankle-brachial index is within normal range.    Patient Profile     72 y.o. 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 Robb Matar.   Assessment & Plan    Acute Hypoxic and Hypercarbic Respiratory Failure - down to [place] - See recommendations for CHF as above.  - Otherwise, per primary team. Suspect respiratory failure is multifactorial (deconditioning, BMI, suspected obstructive sleep apnea/ obesity hypoventilation syndrome, and CHF). He would benefit from outpatient sleep study.   Acute HFpEF - It is very difficult to assess volume status given his body habitus. Given renal function is stable, would continue diuresis with high dose lasix - Will hold off on starting Spironolactone right now given soft BP at times but hopefully can add this at a later time. - Would avoid SGLT2 inhibitors given he is bedbound and prone to UTIs. - Continue to monitor daily weights, strict I/Os, and renal function.   Permanent Atrial Fibrillation  - Continue Diltiazem CD 300mg  daily. Unable to uptitrate this due to soft BP and need for ongoing diuresis.  - Will discuss MD about the  potential of using Amiodarone for rate control if rates remain elevated. - Continue home Pradaxa 150mg  twice daily.   Hypertension - Continue Diltiazem CD 300mg  daily. - Continue to hold Losartan given soft BP to allow for additional rate control and diuresis. .   Hyperlipidemia - Continue Lipitor 10mg  daily.   PAD ABIs this  admission showed non-compressible arteries on the right but were normal on the left. - Continue Pradaxa and Lipitor as above.  Hypokalemia - improved still needs high dose repletion while on lasix   Otherwise, per primary team: - Type 2 diabetes mellitus - Severe peripheral neuropathy - S/p colectomy and ileostomy  - Seizure disorder - Multiple DVT - Chronic pain - Sacral decubitus ulcer    For questions or updates, please contact [facility] Please consult [email or web address] for contact info under     Riley Lam, MD [facility] Cardiologist [facility]  [address] [place], [address] [place], [place] [address] [phone]  11:51 AM

## 2023-02-20 DIAGNOSIS — J9601 Acute respiratory failure with hypoxia: Secondary | ICD-10-CM | POA: Diagnosis not present

## 2023-02-20 DIAGNOSIS — J9602 Acute respiratory failure with hypercapnia: Secondary | ICD-10-CM | POA: Diagnosis not present

## 2023-02-20 LAB — BASIC METABOLIC PANEL
Anion gap: 12 (ref 5–15)
BUN: 20 mg/dL (ref 8–23)
CO2: 30 mmol/L (ref 22–32)
Calcium: 8.7 mg/dL — ABNORMAL LOW (ref 8.9–10.3)
Chloride: 93 mmol/L — ABNORMAL LOW (ref 98–111)
Creatinine, Ser: 0.67 mg/dL (ref 0.61–1.24)
GFR, Estimated: >60 mL/min (ref >60)
Glucose, Bld: 148 mg/dL — ABNORMAL HIGH (ref 70–99)
Potassium: 4.3 mmol/L (ref 3.5–5.1)
Sodium: 135 mmol/L (ref 135–145)

## 2023-02-20 LAB — CBC
HCT: 42.7 % (ref 39.0–52.0)
Hemoglobin: 12.6 g/dL — ABNORMAL LOW (ref 13.0–17.0)
MCH: 27.2 pg (ref 26.0–34.0)
MCHC: 29.5 g/dL — ABNORMAL LOW (ref 30.0–36.0)
MCV: 92 fL (ref 80.0–100.0)
Platelets: 261 10*3/uL (ref 150–400)
RBC: 4.64 MIL/uL (ref 4.22–5.81)
RDW: 16.9 % — ABNORMAL HIGH (ref 11.5–15.5)
WBC: 9.9 10*3/uL (ref 4.0–10.5)
nRBC: 0 % (ref 0.0–0.2)

## 2023-02-20 LAB — GLUCOSE, CAPILLARY
Glucose-Capillary: 132 mg/dL — ABNORMAL HIGH (ref 70–99)
Glucose-Capillary: 190 mg/dL — ABNORMAL HIGH (ref 70–99)
Glucose-Capillary: 189 mg/dL — ABNORMAL HIGH (ref 70–99)
Glucose-Capillary: 197 mg/dL — ABNORMAL HIGH (ref 70–99)

## 2023-02-20 NOTE — Plan of Care (Signed)

## 2023-02-20 NOTE — Progress Notes (Signed)
Progress Note  Patient Name: [name] Date of Encounter: 02/20/2023 Primary Cardiologist: Chrystie Nose, MD   Subjective   Overnight some nocturnal confusion noted by staff. Patient notes no shortness of breath.  He is discouraged by his pain.  Vital Signs    Vitals:   02/19/23 1216 02/19/23 1652 02/19/23 1954 02/20/23 0505  BP: (!) 125/58 130/75 124/74 115/78  Pulse: (!) 101 (!) 101 (!) 106 86  Resp:  18 18 18   Temp:  99.1 F (37.3 C) 98.3 F (36.8 C) 98 F (36.7 C)  TempSrc:  Oral Axillary Axillary  SpO2: 94% 93% 95% 95%  Weight:      Height:        Intake/Output Summary (Last 24 hours) at 02/20/2023 0638 Last data filed at 02/20/2023 0500 Gross per 24 hour  Intake 757.1 ml  Output 4160 ml  Net -3402.9 ml   Filed Weights   02/17/23 0500 02/18/23 0400 02/19/23 0300  Weight: (!) 148.4 kg (!) 148.4 kg (!) 146.3 kg    Physical Exam   GEN: No acute distress.   NECK: Thick neck, unable to assess JVD. CARDIOVASCULAR: IRIR rhythm, distant heart sounds. ABDOMEN: Ileostomy site appears normal. EXTREMITIES: Bilateral painful edema. Respiratory: Clear to auscultation bilaterally.   Labs   Telemetry: atrial fibrillation with rare premature ventricular contractions, average heart rate approximately 110 bpm   Chemistry Recent Labs  Lab 02/16/23 0123 02/16/23 0827 02/17/23 0436 02/18/23 0311 02/19/23 0523 02/20/23 0542  NA 134*   < > 136 134* 135 135  K 5.1   < > 4.3 3.4* 3.8 4.3  CL 96*   < > 92* 90* 91* 93*  CO2 31   < > 33* 32 32 30  GLUCOSE 189*   < > 138* 125* 139* 148*  BUN 17   < > 18 21 21 20   CREATININE 0.65   < > 0.61 0.64 0.76 0.67  CALCIUM 8.5*   < > 8.5* 8.5* 8.4* 8.7*  PROT 7.5  --  7.0  --   --   --   ALBUMIN 3.3*  --  3.2*  --   --   --   AST 13*  --  20  --   --   --   ALT 13  --  11  --   --   --   ALKPHOS 36*  --  36*  --   --   --   BILITOT 0.6  --  0.6  --   --   --   GFRNONAA >60   < > >60 >60 >60 >60  ANIONGAP 7   < >  11 12 12 12    < > = values in this interval not displayed.     Hematology Recent Labs  Lab 02/18/23 0311 02/19/23 0523 02/20/23 0542  WBC 8.8 8.0 9.9  RBC 4.35 4.57 4.64  HGB 11.7* 12.2* 12.6*  HCT 39.6 41.1 42.7  MCV 91.0 89.9 92.0  MCH 26.9 26.7 27.2  MCHC 29.5* 29.7* 29.5*  RDW 16.5* 16.7* 16.9*  PLT 280 257 261    Cardiac EnzymesNo results for input(s): "TROPONINI" in the last 168 hours. No results for input(s): "TROPIPOC" in the last 168 hours.   BNP Recent Labs  Lab 02/16/23 0123  BNP 198.7*     DDimer No results for input(s): "DDIMER" in the last 168 hours.   Cardiac Studies   Cardiac Studies & Procedures  ECHOCARDIOGRAM  ECHOCARDIOGRAM COMPLETE 02/16/2023  Narrative ECHOCARDIOGRAM REPORT    Patient Name:   [name] Date of Exam: 02/16/2023 Medical Rec #:  [id number]      Height:       77.0 in Accession #:    [id number]     Weight:       325.0 lb Date of Birth:  [birth year]      BSA:          2.749 m Patient Age:    72 years       BP:           129/69 mmHg Patient Gender: M              HR:           125 bpm. Exam Location:  Inpatient  Procedure: 3D Echo, Cardiac Doppler and Color Doppler  Indications:    CHF I50.9  History:        Patient has prior history of Echocardiogram examinations, most recent 05/23/2014. Arrythmias:Atrial Flutter, Signs/Symptoms:Dyspnea; Risk Factors:Dyslipidemia, Former Smoker and Hypertension. TEE 05/2014: Atrial septal aneurysm with likely small PFO.  Sonographer:    Dondra Prader RVT RCS Referring Phys: [phone] DAVID MANUEL ORTIZ   Sonographer Comments: HR 105-144 bpm Due to very very poor and limited windows, Definity deemed inappropriate to use at this time. Unable to obtain all picutures and measurements due to A-fib w/ rvr. Patient unable to follow directions IMPRESSIONS   1. Left ventricular ejection fraction, by estimation, is 60 to 65%. The left ventricle has normal function. The left ventricle  has no regional wall motion abnormalities. Left ventricular diastolic parameters are indeterminate. 2. Right ventricular systolic function is normal. The right ventricular size is normal. 3. The mitral valve is normal in structure. No evidence of mitral valve regurgitation. No evidence of mitral stenosis. 4. The aortic valve is tricuspid. Aortic valve regurgitation is not visualized. No aortic stenosis is present.  FINDINGS Left Ventricle: Left ventricular ejection fraction, by estimation, is 60 to 65%. The left ventricle has normal function. The left ventricle has no regional wall motion abnormalities. The left ventricular internal cavity size was normal in size. There is no left ventricular hypertrophy. Left ventricular diastolic function could not be evaluated due to atrial fibrillation. Left ventricular diastolic parameters are indeterminate.  Right Ventricle: The right ventricular size is normal. No increase in right ventricular wall thickness. Right ventricular systolic function is normal.  Left Atrium: Left atrial size was normal in size.  Right Atrium: Right atrial size was normal in size.  Pericardium: There is no evidence of pericardial effusion.  Mitral Valve: The mitral valve is normal in structure. No evidence of mitral valve regurgitation. No evidence of mitral valve stenosis.  Tricuspid Valve: The tricuspid valve is normal in structure. Tricuspid valve regurgitation is trivial. No evidence of tricuspid stenosis.  Aortic Valve: The aortic valve is tricuspid. Aortic valve regurgitation is not visualized. No aortic stenosis is present.  Pulmonic Valve: The pulmonic valve was normal in structure. Pulmonic valve regurgitation is not visualized. No evidence of pulmonic stenosis.  Aorta: The aortic root is normal in size and structure.  IAS/Shunts: No atrial level shunt detected by color flow Doppler.   LEFT VENTRICLE PLAX 2D LVOT diam:     2.20 cm LVOT Area:     3.80  cm   RIGHT VENTRICLE TAPSE (M-mode): 1.8 cm  PULMONIC VALVE AORTA  PV Vmax:       0.87 m/s Ao Root diam: 3.50 cm PV Peak grad:  3.0 mmHg   TRICUSPID VALVE TR Peak grad:   12.2 mmHg TR Vmax:        175.00 cm/s  SHUNTS Systemic Diam: 2.20 cm  Chilton Si MD Electronically signed by Chilton Si MD Signature Date/Time: 02/16/2023/2:37:39 PM    Final                  Assessment & Plan   Acute on Chronic Heart Failure with Preserved Ejection Fraction - Responding well to high dose Lasix (120mg  IV BID). Potassium levels are stable with high dose supplementation. SGLT2 inhibitor not added due to bed-bound status and high risk of UTI. MRA may be added during the course. -Continue Lasix 120mg  IV BID. -Monitor potassium levels and adjust supplementation as needed.  Permanent Atrial Fibrillation and Hypertension - ARB stopped for rate control.. Diltiazem increased resulting in rate control ~ 110. Patient is on home dose of Pradaxa. -Continue Diltiazem and Pradaxa. -Monitor heart rate and adjust medication as needed.  Peripheral Arterial Disease and Hyperlipidemia Triphasic pulses evaluated by Doppler. Patient is on Lipitor. -Continue Lipitor.  History of Multiple DVTs Requires anticoagulation in addition to the indication for atrial fibrillation. - AC as per TRH  Morbid Obesity and Obstructive Sleep Apnea - CPAP is encouraged - Diuresis as above  Otherwise, per primary team: - Type 2 diabetes mellitus - Severe peripheral neuropathy - S/p colectomy and ileostomy  - Seizure disorder - Chronic pain - Sacral decubitus ulcer  For questions or updates, please contact [facility] Please consult [email or web address] for contact info under Cardiology/STEMI.      Riley Lam, MD FASE [facility] Cardiologist [facility]  [address] [place], [address] [place], [place] [address] [phone]  6:38 AM

## 2023-02-20 NOTE — Progress Notes (Signed)
PROGRESS NOTE    TAUHEED MCFAYDEN  ZOX:[id number] DOB: 31-Mar-1951 DOA: 02/16/2023 PCP: Caffie Damme, MD   Brief Narrative: [name] is a 72 y.o. 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  Patient presented secondary to shortness of breath and was found to have respiratory failure secondary to acute heart failure. Cardiology consulted. IV diuresis started.   Assessment and Plan:  Acute respiratory failure with hypoxia and hypercapnia Appears to be secondary to acute heart failure.  Patient has been weaned to room air.  Resolved.  Acute heart failure with preserved EF Cardiology consulted.  Patient started on Lasix IV diuresis for management.  Echocardiogram significant for an LVEF of 60 to 65% with indeterminate diastolic parameters and no regional wall motion abnormality.  Urine output of 3.9 L over the last 24 hours. Weight of 327 lbs on admission. Weight of pending today. -Cardiology recommendations: Lasix 120 mg IV twice daily with potassium supplementation  Permanent atrial fibrillation Intermittent rapid ventricular rate.  Patient is on diltiazem and Pradaxa as an outpatient. -Continue diltiazem 200 mg daily -Continue Pradaxa  Primary hypertension Patient is on diltiazem and losartan as an outpatient.  Losartan held secondary to aggressive diuresis. -Continue diltiazem  Diabetes mellitus type 2 Poorly controlled with hyperglycemia and hemoglobin A1c of 8.0%.  Patient is managed on glipizide as an outpatient, which were held on admission.  Sliding scale insulin initiated on admission. -Continue sliding scale insulin  Seizure disorder Patient is on Lamictal and Keppra as an outpatient.-Continue Lamictal and Keppra  Peripheral neuropathy Chronic pain Patient is on  gabapentin and Cymbalta as an outpatient. -Continue gabapentin and Cymbalta  Obesity Estimated body mass index is 38.25 kg/m as calculated from the following:   Height as of this encounter: 6\' 5"  (1.956 m).   Weight as of this encounter: 146.3 kg.  History of DVT -Continue Pradaxa  Hyperlipidemia Lipitor and omega-3 fatty acid  Pressure injury Left buttocks. Present on admission.     DVT prophylaxis: Pradaxa Code Status:   Code Status: Full Code Family Communication: Sister at bedside. Disposition Plan: Home likely in 2 to 3 days pending continued cardiology recommendations   Consultants:  Cardiology  Procedures:  Transthoracic Echocardiogram  Antimicrobials: None    Subjective: Continued chronic pain. No other concerns.  Objective: BP 115/78 (BP Location: Left Arm)   Pulse 86   Temp 98 F (36.7 C) (Axillary)   Resp 18   Ht 6\' 5"  (1.956 m)   Wt (!) 146.3 kg   SpO2 95%   BMI 38.25 kg/m   Examination:  General exam: Appears calm and comfortable Respiratory system: Clear to auscultation. Respiratory effort normal. Cardiovascular system: S1 & S2 heard, irregular rhythm and regular Gastrointestinal system: Abdomen is nondistended, soft and nontender. Normal bowel sounds heard. Central nervous system: Alert and oriented. No focal neurological deficits. Psychiatry: Judgement and insight appear normal. Mood & affect appropriate.    Data Reviewed: I have personally reviewed following labs and imaging studies  CBC Lab Results  Component Value Date   WBC 9.9 02/20/2023   RBC 4.64 02/20/2023   HGB 12.6 (L) 02/20/2023   HCT 42.7 02/20/2023   MCV 92.0 02/20/2023   MCH 27.2 02/20/2023   PLT 261 02/20/2023   MCHC 29.5 (L) 02/20/2023   RDW 16.9 (H) 02/20/2023   LYMPHSABS 0.8 02/16/2023  MONOABS 0.6 02/16/2023   EOSABS 0.3 02/16/2023   BASOSABS 0.0 02/16/2023     Last metabolic panel Lab Results  Component Value Date   NA 135 02/20/2023   K 4.3  02/20/2023   CL 93 (L) 02/20/2023   CO2 30 02/20/2023   BUN 20 02/20/2023   CREATININE 0.67 02/20/2023   GLUCOSE 148 (H) 02/20/2023   GFRNONAA >60 02/20/2023   GFRAA 128 11/26/2016   CALCIUM 8.7 (L) 02/20/2023   PHOS 2.4 (L) 02/16/2023   PROT 7.0 02/17/2023   ALBUMIN 3.2 (L) 02/17/2023   LABGLOB 3.1 11/26/2016   AGRATIO 1.1 (L) 11/26/2016   BILITOT 0.6 02/17/2023   ALKPHOS 36 (L) 02/17/2023   AST 20 02/17/2023   ALT 11 02/17/2023   ANIONGAP 12 02/20/2023    GFR: Estimated Creatinine Clearance: 136.1 mL/min (by C-G formula based on SCr of 0.67 mg/dL).  Recent Results (from the past 240 hour(s))  Blood Culture (routine x 2)     Status: None (Preliminary result)   Collection Time: 02/16/23  1:45 AM   Specimen: BLOOD RIGHT HAND  Result Value Ref Range Status   Specimen Description BLOOD RIGHT HAND  Final   Special Requests   Final    BOTTLES DRAWN AEROBIC AND ANAEROBIC Blood Culture adequate volume   Culture   Final    NO GROWTH 3 DAYS Performed at [facility], [address] [place], [place] [address]    Report Status PENDING  Incomplete  Blood Culture (routine x 2)     Status: None (Preliminary result)   Collection Time: 02/16/23  2:04 AM   Specimen: BLOOD  Result Value Ref Range Status   Specimen Description BLOOD SITE NOT SPECIFIED  Final   Special Requests   Final    BOTTLES DRAWN AEROBIC AND ANAEROBIC Blood Culture results may not be optimal due to an excessive volume of blood received in culture bottles   Culture   Final    NO GROWTH 3 DAYS Performed at [facility], [address], [place], [place] [address]    Report Status PENDING  Incomplete  Urine Culture     Status: Abnormal   Collection Time: 02/16/23  8:27 AM   Specimen: Urine, Random  Result Value Ref Range Status   Specimen Description   Final    URINE, RANDOM Performed at [facility], [address] [place], [place] [address]    Special Requests   Final    NONE  Reflexed from [phone] Performed at [facility], [address], [place], [place] [address]    Culture MULTIPLE SPECIES PRESENT, SUGGEST RECOLLECTION (A)  Final   Report Status 02/17/2023 FINAL  Final  MRSA Next Gen by PCR, Nasal     Status: None   Collection Time: 02/16/23 12:05 PM   Specimen: Nasal Mucosa; Nasal Swab  Result Value Ref Range Status   MRSA by PCR Next Gen NOT DETECTED NOT DETECTED Final    Comment: (NOTE) The GeneXpert MRSA Assay (FDA approved for NASAL specimens only), is one component of a comprehensive MRSA colonization surveillance program. It is not intended to diagnose MRSA infection nor to guide or monitor treatment for MRSA infections. Test performance is not FDA approved in patients less than 76 years old. Performed at [facility], [address] [place], [place] [address]       Radiology Studies: No results found.    LOS: 4 days  Jacquelin Hawking, MD [facility] 02/20/2023, 10:34 AM   If 7PM-7AM, please contact night-coverage [email or web address]

## 2023-02-20 NOTE — Progress Notes (Signed)
   02/20/23 2302  BiPAP/CPAP/SIPAP  Reason BIPAP/CPAP not in use Other(comment);Non-compliant (Pt refused again tonight. machine remained bedside RN aware)

## 2023-02-21 DIAGNOSIS — J9601 Acute respiratory failure with hypoxia: Secondary | ICD-10-CM | POA: Diagnosis not present

## 2023-02-21 DIAGNOSIS — J9602 Acute respiratory failure with hypercapnia: Secondary | ICD-10-CM | POA: Diagnosis not present

## 2023-02-21 LAB — CBC
HCT: 44.3 % (ref 39.0–52.0)
Hemoglobin: 12.8 g/dL — ABNORMAL LOW (ref 13.0–17.0)
MCH: 26.6 pg (ref 26.0–34.0)
MCHC: 28.9 g/dL — ABNORMAL LOW (ref 30.0–36.0)
MCV: 91.9 fL (ref 80.0–100.0)
Platelets: 302 10*3/uL (ref 150–400)
RBC: 4.82 MIL/uL (ref 4.22–5.81)
RDW: 16.7 % — ABNORMAL HIGH (ref 11.5–15.5)
WBC: 9.6 10*3/uL (ref 4.0–10.5)
nRBC: 0 % (ref 0.0–0.2)

## 2023-02-21 LAB — GLUCOSE, CAPILLARY
Glucose-Capillary: 158 mg/dL — ABNORMAL HIGH (ref 70–99)
Glucose-Capillary: 196 mg/dL — ABNORMAL HIGH (ref 70–99)
Glucose-Capillary: 221 mg/dL — ABNORMAL HIGH (ref 70–99)
Glucose-Capillary: 181 mg/dL — ABNORMAL HIGH (ref 70–99)

## 2023-02-21 LAB — CULTURE, BLOOD (ROUTINE X 2)
Culture: NO GROWTH
Culture: NO GROWTH
Special Requests: ADEQUATE

## 2023-02-21 LAB — BASIC METABOLIC PANEL
Anion gap: 11 (ref 5–15)
BUN: 31 mg/dL — ABNORMAL HIGH (ref 8–23)
CO2: 34 mmol/L — ABNORMAL HIGH (ref 22–32)
Calcium: 9.1 mg/dL (ref 8.9–10.3)
Chloride: 91 mmol/L — ABNORMAL LOW (ref 98–111)
Creatinine, Ser: 0.77 mg/dL (ref 0.61–1.24)
GFR, Estimated: >60 mL/min (ref >60)
Glucose, Bld: 162 mg/dL — ABNORMAL HIGH (ref 70–99)
Potassium: 4 mmol/L (ref 3.5–5.1)
Sodium: 136 mmol/L (ref 135–145)

## 2023-02-21 NOTE — Progress Notes (Signed)
Progress Note  Patient Name: [name] Date of Encounter: 02/21/2023 Primary Cardiologist: Chrystie Nose, MD   Subjective   He is sleeping comfortably on O2  Vital Signs    Vitals:   02/20/23 0730 02/20/23 1318 02/20/23 2023 02/21/23 0531  BP:  (!) 101/47 111/72 117/78  Pulse:  95 60 100  Resp:  19 16 16   Temp:  98.4 F (36.9 C) (!) 97.4 F (36.3 C) 98 F (36.7 C)  TempSrc:  Oral    SpO2:  94% 93% 92%  Weight: (!) 142.5 kg   (!) 141.7 kg  Height:        Intake/Output Summary (Last 24 hours) at 02/21/2023 0905 Last data filed at 02/21/2023 0802 Gross per 24 hour  Intake 358 ml  Output 3075 ml  Net -2717 ml   Filed Weights   02/19/23 0300 02/20/23 0730 02/21/23 0531  Weight: (!) 146.3 kg (!) 142.5 kg (!) 141.7 kg    Physical Exam   GEN: No acute distress.   NECK: Thick neck, unable to assess JVD. CARDIOVASCULAR: IRIR rhythm, distant heart sounds. ABDOMEN: Ileostomy site appears normal. EXTREMITIES: Bilateral painful edem; venous stasis with wounds Respiratory: Clear to auscultation bilaterally.   Labs   Telemetry: atrial fibrillation  rate controlled mostly   Chemistry Recent Labs  Lab 02/16/23 0123 02/16/23 0827 02/17/23 0436 02/18/23 0311 02/19/23 0523 02/20/23 0542 02/21/23 0544  NA 134*   < > 136   < > 135 135 136  K 5.1   < > 4.3   < > 3.8 4.3 4.0  CL 96*   < > 92*   < > 91* 93* 91*  CO2 31   < > 33*   < > 32 30 34*  GLUCOSE 189*   < > 138*   < > 139* 148* 162*  BUN 17   < > 18   < > 21 20 31*  CREATININE 0.65   < > 0.61   < > 0.76 0.67 0.77  CALCIUM 8.5*   < > 8.5*   < > 8.4* 8.7* 9.1  PROT 7.5  --  7.0  --   --   --   --   ALBUMIN 3.3*  --  3.2*  --   --   --   --   AST 13*  --  20  --   --   --   --   ALT 13  --  11  --   --   --   --   ALKPHOS 36*  --  36*  --   --   --   --   BILITOT 0.6  --  0.6  --   --   --   --   GFRNONAA >60   < > >60   < > >60 >60 >60  ANIONGAP 7   < > 11   < > 12 12 11    < > = values in this  interval not displayed.     Hematology Recent Labs  Lab 02/19/23 0523 02/20/23 0542 02/21/23 0544  WBC 8.0 9.9 9.6  RBC 4.57 4.64 4.82  HGB 12.2* 12.6* 12.8*  HCT 41.1 42.7 44.3  MCV 89.9 92.0 91.9  MCH 26.7 27.2 26.6  MCHC 29.7* 29.5* 28.9*  RDW 16.7* 16.9* 16.7*  PLT 257 261 302    Cardiac EnzymesNo results for input(s): "TROPONINI" in the last 168 hours. No results for input(s): "TROPIPOC" in the  last 168 hours.   BNP Recent Labs  Lab 02/16/23 0123  BNP 198.7*     DDimer No results for input(s): "DDIMER" in the last 168 hours.   Cardiac Studies   Cardiac Studies & Procedures       ECHOCARDIOGRAM  ECHOCARDIOGRAM COMPLETE 02/16/2023  Narrative ECHOCARDIOGRAM REPORT    Patient Name:   DARDEN FLEMISTER Date of Exam: 02/16/2023 Medical Rec #:  [id number]      Height:       77.0 in Accession #:    [id number]     Weight:       325.0 lb Date of Birth:  Jul 29, 1950      BSA:          2.749 m Patient Age:    72 years       BP:           129/69 mmHg Patient Gender: M              HR:           125 bpm. Exam Location:  Inpatient  Procedure: 3D Echo, Cardiac Doppler and Color Doppler  Indications:    CHF I50.9  History:        Patient has prior history of Echocardiogram examinations, most recent 05/23/2014. Arrythmias:Atrial Flutter, Signs/Symptoms:Dyspnea; Risk Factors:Dyslipidemia, Former Smoker and Hypertension. TEE 05/2014: Atrial septal aneurysm with likely small PFO.  Sonographer:    Dondra Prader RVT RCS Referring Phys: [phone] DAVID MANUEL ORTIZ   Sonographer Comments: HR 105-144 bpm Due to very very poor and limited windows, Definity deemed inappropriate to use at this time. Unable to obtain all picutures and measurements due to A-fib w/ rvr. Patient unable to follow directions IMPRESSIONS   1. Left ventricular ejection fraction, by estimation, is 60 to 65%. The left ventricle has normal function. The left ventricle has no regional wall motion  abnormalities. Left ventricular diastolic parameters are indeterminate. 2. Right ventricular systolic function is normal. The right ventricular size is normal. 3. The mitral valve is normal in structure. No evidence of mitral valve regurgitation. No evidence of mitral stenosis. 4. The aortic valve is tricuspid. Aortic valve regurgitation is not visualized. No aortic stenosis is present.  FINDINGS Left Ventricle: Left ventricular ejection fraction, by estimation, is 60 to 65%. The left ventricle has normal function. The left ventricle has no regional wall motion abnormalities. The left ventricular internal cavity size was normal in size. There is no left ventricular hypertrophy. Left ventricular diastolic function could not be evaluated due to atrial fibrillation. Left ventricular diastolic parameters are indeterminate.  Right Ventricle: The right ventricular size is normal. No increase in right ventricular wall thickness. Right ventricular systolic function is normal.  Left Atrium: Left atrial size was normal in size.  Right Atrium: Right atrial size was normal in size.  Pericardium: There is no evidence of pericardial effusion.  Mitral Valve: The mitral valve is normal in structure. No evidence of mitral valve regurgitation. No evidence of mitral valve stenosis.  Tricuspid Valve: The tricuspid valve is normal in structure. Tricuspid valve regurgitation is trivial. No evidence of tricuspid stenosis.  Aortic Valve: The aortic valve is tricuspid. Aortic valve regurgitation is not visualized. No aortic stenosis is present.  Pulmonic Valve: The pulmonic valve was normal in structure. Pulmonic valve regurgitation is not visualized. No evidence of pulmonic stenosis.  Aorta: The aortic root is normal in size and structure.  IAS/Shunts: No atrial level shunt detected by color flow Doppler.  LEFT VENTRICLE PLAX 2D LVOT diam:     2.20 cm LVOT Area:     3.80 cm   RIGHT VENTRICLE TAPSE  (M-mode): 1.8 cm  PULMONIC VALVE AORTA                 PV Vmax:       0.87 m/s Ao Root diam: 3.50 cm PV Peak grad:  3.0 mmHg   TRICUSPID VALVE TR Peak grad:   12.2 mmHg TR Vmax:        175.00 cm/s  SHUNTS Systemic Diam: 2.20 cm  Chilton Si MD Electronically signed by Chilton Si MD Signature Date/Time: 02/16/2023/2:37:39 PM    Final                  Assessment & Plan   Acute on Chronic Heart Failure with Preserved Ejection Fraction - Responding well to high dose Lasix (120mg  IV BID). Potassium levels are stable with high dose supplementation. SGLT2 inhibitor not added due to bed-bound status and high risk of UTI. MRA may be added during the course. -Continue Lasix 120mg  IV BID over the weekend -Monitor potassium levels and adjust supplementation as needed.  Permanent Atrial Fibrillation and Hypertension  - rate controlled - ARB stopped for rate control.. Diltiazem increased resulting in rate control ~ 110. Patient is on home dose of Pradaxa. -Continue Diltiazem and Pradaxa. -Monitor heart rate and adjust medication as needed.  Peripheral Arterial Disease and Hyperlipidemia Triphasic pulses evaluated by Doppler. Patient is on Lipitor. -Continue Lipitor.  History of Multiple DVTs Requires anticoagulation in addition to the indication for atrial fibrillation. - AC as per TRH  Morbid Obesity and Obstructive Sleep Apnea - CPAP is encouraged - Diuresis as above  Otherwise, per primary team: - Type 2 diabetes mellitus - Severe peripheral neuropathy - S/p colectomy and ileostomy  - Seizure disorder - Chronic pain - Sacral decubitus ulcer  For questions or updates, please contact [facility] Please consult [email or web address] for contact info under Cardiology/STEMI.   No changes today. Will follow peripherally over the weekend    Maisie Fus   Time Spent Directly with Patient:  I have spent a total of 35 minutes with the patient reviewing  hospital notes, telemetry, EKGs, labs and examining the patient as well as establishing an assessment and plan that was discussed personally with the patient.  > 50% of time was spent in direct patient care.

## 2023-02-21 NOTE — Progress Notes (Signed)
PROGRESS NOTE    [name]  RUE:[id number] DOB: 1950-06-29 DOA: 02/16/2023 PCP: Caffie Damme, MD   Brief Narrative: [name] is a 72 y.o. 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  Patient presented secondary to shortness of breath and was found to have respiratory failure secondary to acute heart failure. Cardiology consulted. IV diuresis started.   Assessment and Plan:  Acute respiratory failure with hypoxia and hypercapnia Appears to be secondary to acute heart failure.  Patient has been weaned to room air.  Resolved.  Acute heart failure with preserved EF Cardiology consulted.  Patient started on Lasix IV diuresis for management.  Echocardiogram significant for an LVEF of 60 to 65% with indeterminate diastolic parameters and no regional wall motion abnormality.  Urine output of 2.7 L over the last 24 hours. Weight of 327 lbs on admission. Weight of 312 lbs today. Creatinine appears to be trending upward. -Cardiology recommendations: Lasix 120 mg IV twice daily with potassium supplementation  Permanent atrial fibrillation Intermittent rapid ventricular rate.  Patient is on diltiazem and Pradaxa as an outpatient. -Continue diltiazem 200 mg daily -Continue Pradaxa  Primary hypertension Patient is on diltiazem and losartan as an outpatient.  Losartan held secondary to aggressive diuresis. -Continue diltiazem  Diabetes mellitus type 2 Poorly controlled with hyperglycemia and hemoglobin A1c of 8.0%.  Patient is managed on glipizide as an outpatient, which were held on admission.  Sliding scale insulin initiated on admission. -Continue sliding scale insulin  Seizure disorder Patient is on Lamictal and Keppra as an outpatient.-Continue Lamictal and  Keppra  Peripheral neuropathy Chronic pain Patient is on gabapentin and Cymbalta as an outpatient. -Continue gabapentin and Cymbalta  Obesity Estimated body mass index is 37.04 kg/m as calculated from the following:   Height as of this encounter: 6\' 5"  (1.956 m).   Weight as of this encounter: 141.7 kg.  History of DVT -Continue Pradaxa  Hyperlipidemia Lipitor and omega-3 fatty acid  Pressure injury Left buttocks. Present on admission.   DVT prophylaxis: Pradaxa Code Status:   Code Status: Full Code Family Communication: Sister at bedside. Disposition Plan: Home likely in 2 to 3 days pending continued cardiology recommendations   Consultants:  Cardiology  Procedures:  Transthoracic Echocardiogram  Antimicrobials: None    Subjective: No issues noted from overnight.  Objective: BP 117/78 (BP Location: Left Arm)   Pulse 100   Temp 98 F (36.7 C)   Resp 16   Ht 6\' 5"  (1.956 m)   Wt (!) 141.7 kg   SpO2 92%   BMI 37.04 kg/m   Examination:  General exam: Appears calm and comfortable Respiratory system: Clear to auscultation. Respiratory effort normal. Cardiovascular system: S1 & S2 heard, RRR. Gastrointestinal system: Abdomen is soft and nontender. Normal bowel sounds heard. Central nervous system: Sleeping but arouses easily  Data Reviewed: I have personally reviewed following labs and imaging studies  CBC Lab Results  Component Value Date   WBC 9.6 02/21/2023   RBC 4.82 02/21/2023   HGB 12.8 (L) 02/21/2023   HCT 44.3 02/21/2023   MCV 91.9 02/21/2023   MCH 26.6 02/21/2023   PLT 302 02/21/2023   MCHC 28.9 (L) 02/21/2023   RDW 16.7 (H) 02/21/2023   LYMPHSABS 0.8 02/16/2023   MONOABS 0.6 02/16/2023   EOSABS 0.3 02/16/2023   BASOSABS 0.0 02/16/2023  Last metabolic panel Lab Results  Component Value Date   NA 136 02/21/2023   K 4.0 02/21/2023   CL 91 (L) 02/21/2023   CO2 34 (H) 02/21/2023   BUN 31 (H) 02/21/2023   CREATININE 0.77  02/21/2023   GLUCOSE 162 (H) 02/21/2023   GFRNONAA >60 02/21/2023   GFRAA 128 11/26/2016   CALCIUM 9.1 02/21/2023   PHOS 2.4 (L) 02/16/2023   PROT 7.0 02/17/2023   ALBUMIN 3.2 (L) 02/17/2023   LABGLOB 3.1 11/26/2016   AGRATIO 1.1 (L) 11/26/2016   BILITOT 0.6 02/17/2023   ALKPHOS 36 (L) 02/17/2023   AST 20 02/17/2023   ALT 11 02/17/2023   ANIONGAP 11 02/21/2023    GFR: Estimated Creatinine Clearance: 133.8 mL/min (by C-G formula based on SCr of 0.77 mg/dL).  Recent Results (from the past 240 hour(s))  Blood Culture (routine x 2)     Status: None (Preliminary result)   Collection Time: 02/16/23  1:45 AM   Specimen: BLOOD RIGHT HAND  Result Value Ref Range Status   Specimen Description BLOOD RIGHT HAND  Final   Special Requests   Final    BOTTLES DRAWN AEROBIC AND ANAEROBIC Blood Culture adequate volume   Culture   Final    NO GROWTH 4 DAYS Performed at [facility], [address] [place], [place] [address]    Report Status PENDING  Incomplete  Blood Culture (routine x 2)     Status: None (Preliminary result)   Collection Time: 02/16/23  2:04 AM   Specimen: BLOOD  Result Value Ref Range Status   Specimen Description BLOOD SITE NOT SPECIFIED  Final   Special Requests   Final    BOTTLES DRAWN AEROBIC AND ANAEROBIC Blood Culture results may not be optimal due to an excessive volume of blood received in culture bottles   Culture   Final    NO GROWTH 4 DAYS Performed at [facility], [address] [place], [place] [address]    Report Status PENDING  Incomplete  Urine Culture     Status: Abnormal   Collection Time: 02/16/23  8:27 AM   Specimen: Urine, Random  Result Value Ref Range Status   Specimen Description   Final    URINE, RANDOM Performed at [facility], [address], [place], [place] [address]    Special Requests   Final    NONE Reflexed from [id number] Performed at [facility], [address]  [place], [place] [address]    Culture MULTIPLE SPECIES PRESENT, SUGGEST RECOLLECTION (A)  Final   Report Status 02/17/2023 FINAL  Final  MRSA Next Gen by PCR, Nasal     Status: None   Collection Time: 02/16/23 12:05 PM   Specimen: Nasal Mucosa; Nasal Swab  Result Value Ref Range Status   MRSA by PCR Next Gen NOT DETECTED NOT DETECTED Final    Comment: (NOTE) The GeneXpert MRSA Assay (FDA approved for NASAL specimens only), is one component of a comprehensive MRSA colonization surveillance program. It is not intended to diagnose MRSA infection nor to guide or monitor treatment for MRSA infections. Test performance is not FDA approved in patients less than 14 years old. Performed at [facility], [address] [place], [place] [address]       Radiology Studies: No results found.    LOS: 5 days    Jacquelin Hawking, MD [facility] 02/21/2023, 9:09 AM   If 7PM-7AM, please contact night-coverage [email or web address]

## 2023-02-21 NOTE — Progress Notes (Signed)
   02/21/23 2246  BiPAP/CPAP/SIPAP  BiPAP/CPAP/SIPAP DREAMSTATIOND  Reason BIPAP/CPAP not in use Non-compliant   Unit remains at bedside.

## 2023-02-21 NOTE — Plan of Care (Signed)
  Problem: Education: Goal: Ability to describe self-care measures that may prevent or decrease complications (Diabetes Survival Skills Education) will improve Outcome: Progressing   Problem: Coping: Goal: Ability to adjust to condition or change in health will improve Outcome: Progressing   Problem: Fluid Volume: Goal: Ability to maintain a balanced intake and output will improve Outcome: Progressing   Problem: Nutritional: Goal: Maintenance of adequate nutrition will improve Outcome: Progressing   Problem: Skin Integrity: Goal: Risk for impaired skin integrity will decrease Outcome: Progressing   Problem: Pain Managment: Goal: General experience of comfort will improve Outcome: Progressing   Problem: Skin Integrity: Goal: Risk for impaired skin integrity will decrease Outcome: Progressing

## 2023-02-21 NOTE — Plan of Care (Signed)

## 2023-02-22 DIAGNOSIS — J9601 Acute respiratory failure with hypoxia: Secondary | ICD-10-CM | POA: Diagnosis not present

## 2023-02-22 DIAGNOSIS — J9602 Acute respiratory failure with hypercapnia: Secondary | ICD-10-CM | POA: Diagnosis not present

## 2023-02-22 LAB — BASIC METABOLIC PANEL
Anion gap: 14 (ref 5–15)
BUN: 38 mg/dL — ABNORMAL HIGH (ref 8–23)
CO2: 31 mmol/L (ref 22–32)
Calcium: 9.2 mg/dL (ref 8.9–10.3)
Chloride: 94 mmol/L — ABNORMAL LOW (ref 98–111)
Creatinine, Ser: 0.72 mg/dL (ref 0.61–1.24)
GFR, Estimated: >60 mL/min (ref >60)
Glucose, Bld: 163 mg/dL — ABNORMAL HIGH (ref 70–99)
Potassium: 3.6 mmol/L (ref 3.5–5.1)
Sodium: 139 mmol/L (ref 135–145)

## 2023-02-22 LAB — GLUCOSE, CAPILLARY
Glucose-Capillary: 167 mg/dL — ABNORMAL HIGH (ref 70–99)
Glucose-Capillary: 275 mg/dL — ABNORMAL HIGH (ref 70–99)
Glucose-Capillary: 234 mg/dL — ABNORMAL HIGH (ref 70–99)
Glucose-Capillary: 213 mg/dL — ABNORMAL HIGH (ref 70–99)

## 2023-02-22 MED ORDER — DOXEPIN HCL 50 MG PO CAPS
150.0000 mg | ORAL_CAPSULE | Freq: Every day | ORAL | Status: DC
  Administered 2023-02-23 – 2023-02-26 (×5): 150 mg via ORAL
  Filled 2023-02-22 (×6): qty 3

## 2023-02-22 MED ORDER — MELATONIN 5 MG PO TABS
5.0000 mg | ORAL_TABLET | Freq: Once | ORAL | Status: AC
  Administered 2023-02-22: 5 mg via ORAL
  Filled 2023-02-22: qty 1

## 2023-02-22 NOTE — Progress Notes (Signed)
PROGRESS NOTE    [name]  ZOX:[id number] DOB: [birth year] DOA: 02/16/2023 PCP: Bruce Damme, MD   Brief Narrative: [name] is a 72 y.o. 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  Patient presented secondary to shortness of breath and was found to have respiratory failure secondary to acute heart failure. Cardiology consulted. IV diuresis started.   Assessment and Plan:  Acute respiratory failure with hypoxia and hypercapnia Appears to be secondary to acute heart failure.  Patient has been weaned to room air.  Resolved.  Acute heart failure with preserved EF Cardiology consulted.  Patient started on Lasix IV diuresis for management.  Echocardiogram significant for an LVEF of 60 to 65% with indeterminate diastolic parameters and no regional wall motion abnormality.  Urine output not documented over the last 24 hours. Weight of 327 lbs on admission. Weight of 312 lbs today. BUN trending upward; creatinine stable. -Cardiology recommendations: Lasix 120 mg IV twice daily with potassium supplementation  Permanent atrial fibrillation Intermittent rapid ventricular rate.  Patient is on diltiazem and Pradaxa as an outpatient. -Continue diltiazem 200 mg daily -Continue Pradaxa  Primary hypertension Patient is on diltiazem and losartan as an outpatient.  Losartan held secondary to aggressive diuresis. -Continue diltiazem  Diabetes mellitus type 2 Poorly controlled with hyperglycemia and hemoglobin A1c of 8.0%.  Patient is managed on glipizide as an outpatient, which were held on admission.  Sliding scale insulin initiated on admission. -Continue sliding scale insulin  Seizure disorder Patient is on Lamictal and Keppra as an outpatient.-Continue Lamictal and  Keppra  Peripheral neuropathy Chronic pain Patient is on gabapentin and Cymbalta as an outpatient. -Continue gabapentin and Cymbalta  Obesity Estimated body mass index is 37.02 kg/m as calculated from the following:   Height as of this encounter: 6\' 5"  (1.956 m).   Weight as of this encounter: 141.6 kg.  History of DVT -Continue Pradaxa  Hyperlipidemia Lipitor and omega-3 fatty acid  Pressure injury Left buttocks. Present on admission.  Bedbound status Stable. -OT eval   DVT prophylaxis: Pradaxa Code Status:   Code Status: Full Code Family Communication: Sister at bedside. Disposition Plan: Home likely in 2 to 3 days pending continued cardiology recommendations   Consultants:  Cardiology  Procedures:  Transthoracic Echocardiogram  Antimicrobials: None    Subjective: Continues to have pain around his buttocks where he has his pressure injury  Objective: BP 113/67 (BP Location: Left Arm)   Pulse 100   Temp 98.6 F (37 C) (Oral)   Resp 20   Ht 6\' 5"  (1.956 m)   Wt (!) 141.6 kg   SpO2 94%   BMI 37.02 kg/m   Examination:  General exam: Appears calm and comfortable Respiratory system: Clear to auscultation. Respiratory effort normal. Cardiovascular system: S1 & S2 heard, RRR. Gastrointestinal system: Abdomen is soft and nontender. Normal bowel sounds heard. Central nervous system: Alert and oriented. No focal neurological deficits. Musculoskeletal: LE edema.  Psychiatry: Judgement and insight appear normal. Mood & affect appropriate.    Data Reviewed: I have personally reviewed following labs and imaging studies  CBC Lab Results  Component Value Date   WBC 9.6 02/21/2023   RBC 4.82 02/21/2023   HGB 12.8 (L) 02/21/2023   HCT 44.3 02/21/2023   MCV 91.9 02/21/2023   MCH 26.6 02/21/2023   PLT 302 02/21/2023   MCHC  28.9 (L) 02/21/2023   RDW 16.7 (H) 02/21/2023   LYMPHSABS 0.8 02/16/2023   MONOABS 0.6 02/16/2023   EOSABS 0.3 02/16/2023    BASOSABS 0.0 02/16/2023     Last metabolic panel Lab Results  Component Value Date   NA 139 02/22/2023   K 3.6 02/22/2023   CL 94 (L) 02/22/2023   CO2 31 02/22/2023   BUN 38 (H) 02/22/2023   CREATININE 0.72 02/22/2023   GLUCOSE 163 (H) 02/22/2023   GFRNONAA >60 02/22/2023   GFRAA 128 11/26/2016   CALCIUM 9.2 02/22/2023   PHOS 2.4 (L) 02/16/2023   PROT 7.0 02/17/2023   ALBUMIN 3.2 (L) 02/17/2023   LABGLOB 3.1 11/26/2016   AGRATIO 1.1 (L) 11/26/2016   BILITOT 0.6 02/17/2023   ALKPHOS 36 (L) 02/17/2023   AST 20 02/17/2023   ALT 11 02/17/2023   ANIONGAP 14 02/22/2023    GFR: Estimated Creatinine Clearance: 133.8 mL/min (by C-G formula based on SCr of 0.72 mg/dL).  Recent Results (from the past 240 hour(s))  Blood Culture (routine x 2)     Status: None   Collection Time: 02/16/23  1:45 AM   Specimen: BLOOD RIGHT HAND  Result Value Ref Range Status   Specimen Description BLOOD RIGHT HAND  Final   Special Requests   Final    BOTTLES DRAWN AEROBIC AND ANAEROBIC Blood Culture adequate volume   Culture   Final    NO GROWTH 5 DAYS Performed at [facility], [address], [place], [place] [address]    Report Status 02/21/2023 FINAL  Final  Blood Culture (routine x 2)     Status: None   Collection Time: 02/16/23  2:04 AM   Specimen: BLOOD  Result Value Ref Range Status   Specimen Description BLOOD SITE NOT SPECIFIED  Final   Special Requests   Final    BOTTLES DRAWN AEROBIC AND ANAEROBIC Blood Culture results may not be optimal due to an excessive volume of blood received in culture bottles   Culture   Final    NO GROWTH 5 DAYS Performed at [facility], [address] [place], [place] [address]    Report Status 02/21/2023 FINAL  Final  Urine Culture     Status: Abnormal   Collection Time: 02/16/23  8:27 AM   Specimen: Urine, Random  Result Value Ref Range Status   Specimen Description   Final    URINE, RANDOM Performed at [facility], [address] [place], [place] [address]    Special Requests   Final    NONE Reflexed from [id number] Performed at [facility], [address], [place], [place] [address]    Culture MULTIPLE SPECIES PRESENT, SUGGEST RECOLLECTION (A)  Final   Report Status 02/17/2023 FINAL  Final  MRSA Next Gen by PCR, Nasal     Status: None   Collection Time: 02/16/23 12:05 PM   Specimen: Nasal Mucosa; Nasal Swab  Result Value Ref Range Status   MRSA by PCR Next Gen NOT DETECTED NOT DETECTED Final    Comment: (NOTE) The GeneXpert MRSA Assay (FDA approved for NASAL specimens only), is one component of a comprehensive MRSA colonization surveillance program. It is not intended to diagnose MRSA infection nor to guide or monitor treatment for MRSA infections. Test performance is not FDA approved in patients less than 67 years old. Performed at [facility], [address], [place], [place] [address]  Radiology Studies: No results found.    LOS: 6 days    Jacquelin Hawking, MD [facility] 02/22/2023, 10:23 AM   If 7PM-7AM, please contact night-coverage [email or web address]

## 2023-02-22 NOTE — Plan of Care (Signed)

## 2023-02-22 NOTE — Progress Notes (Signed)
   02/22/23 2218  BiPAP/CPAP/SIPAP  BiPAP/CPAP/SIPAP DREAMSTATIOND  Reason BIPAP/CPAP not in use Non-compliant  BiPAP/CPAP /SiPAP Vitals  Bilateral Breath Sounds Diminished  MEWS Score/Color  MEWS Score 0  MEWS Score Color Green     02/22/23 2218  BiPAP/CPAP/SIPAP  BiPAP/CPAP/SIPAP DREAMSTATIOND  Reason BIPAP/CPAP not in use Non-compliant  BiPAP/CPAP /SiPAP Vitals  Bilateral Breath Sounds Diminished  MEWS Score/Color  MEWS Score 0  MEWS Score Color [place]

## 2023-02-23 DIAGNOSIS — J9601 Acute respiratory failure with hypoxia: Secondary | ICD-10-CM | POA: Diagnosis not present

## 2023-02-23 DIAGNOSIS — J9602 Acute respiratory failure with hypercapnia: Secondary | ICD-10-CM | POA: Diagnosis not present

## 2023-02-23 DIAGNOSIS — I5031 Acute diastolic (congestive) heart failure: Secondary | ICD-10-CM

## 2023-02-23 DIAGNOSIS — I4891 Unspecified atrial fibrillation: Secondary | ICD-10-CM

## 2023-02-23 DIAGNOSIS — I10 Essential (primary) hypertension: Secondary | ICD-10-CM | POA: Diagnosis not present

## 2023-02-23 LAB — GLUCOSE, CAPILLARY
Glucose-Capillary: 192 mg/dL — ABNORMAL HIGH (ref 70–99)
Glucose-Capillary: 213 mg/dL — ABNORMAL HIGH (ref 70–99)
Glucose-Capillary: 270 mg/dL — ABNORMAL HIGH (ref 70–99)
Glucose-Capillary: 163 mg/dL — ABNORMAL HIGH (ref 70–99)

## 2023-02-23 LAB — BASIC METABOLIC PANEL
Anion gap: 11 (ref 5–15)
BUN: 44 mg/dL — ABNORMAL HIGH (ref 8–23)
CO2: 33 mmol/L — ABNORMAL HIGH (ref 22–32)
Calcium: 9.1 mg/dL (ref 8.9–10.3)
Chloride: 96 mmol/L — ABNORMAL LOW (ref 98–111)
Creatinine, Ser: 0.85 mg/dL (ref 0.61–1.24)
GFR, Estimated: >60 mL/min (ref >60)
Glucose, Bld: 186 mg/dL — ABNORMAL HIGH (ref 70–99)
Potassium: 3.2 mmol/L — ABNORMAL LOW (ref 3.5–5.1)
Sodium: 140 mmol/L (ref 135–145)

## 2023-02-23 MED ORDER — INSULIN GLARGINE-YFGN 100 UNIT/ML ~~LOC~~ SOLN
15.0000 [IU] | Freq: Every day | SUBCUTANEOUS | Status: DC
  Administered 2023-02-23 – 2023-02-27 (×5): 15 [IU] via SUBCUTANEOUS
  Filled 2023-02-23 (×5): qty 0.15

## 2023-02-23 MED ORDER — SPIRONOLACTONE 25 MG PO TABS
25.0000 mg | ORAL_TABLET | Freq: Every day | ORAL | Status: DC
  Administered 2023-02-23 – 2023-02-27 (×5): 25 mg via ORAL
  Filled 2023-02-23 (×6): qty 1

## 2023-02-23 MED ORDER — TORSEMIDE 20 MG PO TABS
40.0000 mg | ORAL_TABLET | Freq: Two times a day (BID) | ORAL | Status: DC
  Administered 2023-02-23 – 2023-02-24 (×2): 40 mg via ORAL
  Filled 2023-02-23 (×3): qty 2

## 2023-02-23 MED ORDER — DILTIAZEM HCL ER COATED BEADS 240 MG PO CP24
360.0000 mg | ORAL_CAPSULE | Freq: Every day | ORAL | Status: DC
  Administered 2023-02-24 – 2023-02-27 (×4): 360 mg via ORAL
  Filled 2023-02-23 (×5): qty 1

## 2023-02-23 MED ORDER — POTASSIUM CHLORIDE CRYS ER 20 MEQ PO TBCR
40.0000 meq | EXTENDED_RELEASE_TABLET | Freq: Once | ORAL | Status: AC
  Administered 2023-02-23: 40 meq via ORAL
  Filled 2023-02-23: qty 2

## 2023-02-23 NOTE — Progress Notes (Signed)
PROGRESS NOTE    [name]  EAV:[id number] DOB: April 25, 1951 DOA: 02/16/2023 PCP: Caffie Damme, MD   Brief Narrative: [name] is a 72 y.o. 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  Patient presented secondary to shortness of breath and was found to have respiratory failure secondary to acute heart failure. Cardiology consulted. IV diuresis started.   Assessment and Plan:  Acute respiratory failure with hypoxia and hypercapnia Appears to be secondary to acute heart failure.  Patient has been weaned to room air.  Resolved.  Acute heart failure with preserved EF Cardiology consulted.  Patient started on Lasix IV diuresis for management.  Echocardiogram significant for an LVEF of 60 to 65% with indeterminate diastolic parameters and no regional wall motion abnormality.  Urine output of 2.2 L Nover the last 24 hours. Weight of 327 lbs on admission. Weight of 310 lbs today. BUN continues trending upward; creatinine elevated. -Cardiology recommendations: Lasix 120 mg IV twice daily with potassium supplementation  Permanent atrial fibrillation Intermittent rapid ventricular rate.  Patient is on diltiazem and Pradaxa as an outpatient. -Continue diltiazem 200 mg daily -Continue Pradaxa  Primary hypertension Patient is on diltiazem and losartan as an outpatient.  Losartan held secondary to aggressive diuresis. -Continue diltiazem  Diabetes mellitus type 2 Poorly controlled with hyperglycemia and hemoglobin A1c of 8.0%.  Patient is managed on glipizide as an outpatient, which were held on admission.  Sliding scale insulin initiated on admission. -Continue sliding scale insulin  Seizure disorder Patient is on Lamictal and Keppra as an outpatient.-Continue Lamictal and  Keppra  Peripheral neuropathy Chronic pain Patient is on gabapentin and Cymbalta as an outpatient. -Continue gabapentin and Cymbalta  Obesity Estimated body mass index is 36.76 kg/m as calculated from the following:   Height as of this encounter: 6\' 5"  (1.956 m).   Weight as of this encounter: 140.6 kg.  History of DVT -Continue Pradaxa  Hyperlipidemia Lipitor and omega-3 fatty acid  Pressure injury Left buttocks. Present on admission.  Bedbound status Stable. -OT eval   DVT prophylaxis: Pradaxa Code Status:   Code Status: Full Code Family Communication: None at bedside. Disposition Plan: Home likely in 1 to 2 days pending continued cardiology recommendations   Consultants:  Cardiology  Procedures:  Transthoracic Echocardiogram  Antimicrobials: None    Subjective: No concerns this morning. Asking about timing of discharge.  Objective: BP 126/61 (BP Location: Right Arm)   Pulse 82   Temp 99.3 F (37.4 C) (Oral)   Resp 14   Ht 6\' 5"  (1.956 m)   Wt (!) 140.6 kg   SpO2 95%   BMI 36.76 kg/m   Examination:  General exam: Appears calm and comfortable Respiratory system: Clear to auscultation. Respiratory effort normal. Cardiovascular system: S1 & S2 heard, RRR. Gastrointestinal system: Abdomen is soft and nontender. Normal bowel sounds heard. Central nervous system: Alert and oriented.\   Data Reviewed: I have personally reviewed following labs and imaging studies  CBC Lab Results  Component Value Date   WBC 9.6 02/21/2023   RBC 4.82 02/21/2023   HGB 12.8 (L) 02/21/2023   HCT 44.3 02/21/2023   MCV 91.9 02/21/2023   MCH 26.6 02/21/2023   PLT 302 02/21/2023   MCHC 28.9 (L) 02/21/2023   RDW 16.7 (H) 02/21/2023   LYMPHSABS 0.8 02/16/2023   MONOABS 0.6 02/16/2023  EOSABS 0.3 02/16/2023   BASOSABS 0.0 02/16/2023     Last metabolic panel Lab Results  Component Value Date   NA 140 02/23/2023   K 3.2 (L) 02/23/2023   CL 96 (L) 02/23/2023    CO2 33 (H) 02/23/2023   BUN 44 (H) 02/23/2023   CREATININE 0.85 02/23/2023   GLUCOSE 186 (H) 02/23/2023   GFRNONAA >60 02/23/2023   GFRAA 128 11/26/2016   CALCIUM 9.1 02/23/2023   PHOS 2.4 (L) 02/16/2023   PROT 7.0 02/17/2023   ALBUMIN 3.2 (L) 02/17/2023   LABGLOB 3.1 11/26/2016   AGRATIO 1.1 (L) 11/26/2016   BILITOT 0.6 02/17/2023   ALKPHOS 36 (L) 02/17/2023   AST 20 02/17/2023   ALT 11 02/17/2023   ANIONGAP 11 02/23/2023    GFR: Estimated Creatinine Clearance: 125.5 mL/min (by C-G formula based on SCr of 0.85 mg/dL).  Recent Results (from the past 240 hour(s))  Blood Culture (routine x 2)     Status: None   Collection Time: 02/16/23  1:45 AM   Specimen: BLOOD RIGHT HAND  Result Value Ref Range Status   Specimen Description BLOOD RIGHT HAND  Final   Special Requests   Final    BOTTLES DRAWN AEROBIC AND ANAEROBIC Blood Culture adequate volume   Culture   Final    NO GROWTH 5 DAYS Performed at [facility], [address] [place], [place] [address]    Report Status 02/21/2023 FINAL  Final  Blood Culture (routine x 2)     Status: None   Collection Time: 02/16/23  2:04 AM   Specimen: BLOOD  Result Value Ref Range Status   Specimen Description BLOOD SITE NOT SPECIFIED  Final   Special Requests   Final    BOTTLES DRAWN AEROBIC AND ANAEROBIC Blood Culture results may not be optimal due to an excessive volume of blood received in culture bottles   Culture   Final    NO GROWTH 5 DAYS Performed at [facility], [address], [place], [place] [address]    Report Status 02/21/2023 FINAL  Final  Urine Culture     Status: Abnormal   Collection Time: 02/16/23  8:27 AM   Specimen: Urine, Random  Result Value Ref Range Status   Specimen Description   Final    URINE, RANDOM Performed at [facility], [address], [place], [place] [address]    Special Requests   Final    NONE Reflexed from [id number] Performed at [facility], [address], [place], [place] [address]    Culture MULTIPLE SPECIES PRESENT, SUGGEST RECOLLECTION (A)  Final   Report Status 02/17/2023 FINAL  Final  MRSA Next Gen by PCR, Nasal     Status: None   Collection Time: 02/16/23 12:05 PM   Specimen: Nasal Mucosa; Nasal Swab  Result Value Ref Range Status   MRSA by PCR Next Gen NOT DETECTED NOT DETECTED Final    Comment: (NOTE) The GeneXpert MRSA Assay (FDA approved for NASAL specimens only), is one component of a comprehensive MRSA colonization surveillance program. It is not intended to diagnose MRSA infection nor to guide or monitor treatment for MRSA infections. Test performance is not FDA approved in patients less than 15 years old. Performed at [facility], [address] [place], [place] [address]       Radiology Studies: No results found.    LOS: 7 days    Jacquelin Hawking, MD [facility]  02/23/2023, 8:50 AM   If 7PM-7AM, please contact night-coverage [email or web address]

## 2023-02-23 NOTE — Evaluation (Signed)
Occupational Therapy Evaluation Patient Details Name: [name] MRN: [id number] DOB: 1950/10/03 Today's Date: 02/23/2023   History of Present Illness Patient is a 72 year old male who presented on 10/7 with increased shortness of breath. Patient was admitted with acute respiratory failure with hypoxia and hypercapnia, acute heart failure with preserved EF, permanent A fib, PMH: seizure disorder, peripheral neuropathy, HTN, obesity, DVT, pressure injury,   Clinical Impression   Patient is a 72 year old male who was admitted for above. Patient lives at home with sister with ADLs completed at bed level. Patient with multiple other co morbidities limiting patient to bed at this time. Patient endorsed wanting a HEP to complete at home to maintain strength and ability to participate in rolling to reduce caregiver burden. Patient was educated on using gait belt as leg lifter to move RLE to reduce caregiver burden in rolling to L side.  OT to continue to follow to develop HEP for patient to use in next level of care.       If plan is discharge home, recommend the following: Assistance with cooking/housework;Direct supervision/assist for medications management;Assist for transportation;Help with stairs or ramp for entrance;Direct supervision/assist for financial management;A lot of help with bathing/dressing/bathroom    Functional Status Assessment  Patient has had a recent decline in their functional status and/or demonstrates limited ability to make significant improvements in function in a reasonable and predictable amount of time  Equipment Recommendations  None recommended by OT       Precautions / Restrictions Precautions Precaution Comments: monitor HR and O2 Restrictions Weight Bearing Restrictions: No      Mobility Bed Mobility Overal bed mobility: Needs Assistance Bed Mobility: Rolling Rolling: Mod assist         General bed mobility comments: mod A to roll to L side and  min guard to roll to R side in bed with use of bed rails. patient was educated on using leg lifter to get RLE over ontop of LLE and then use trunk to turn. patient demonstrated understanding with increased time with min A to compelte roll to L side this way.              ADL either performed or assessed with clinical judgement   ADL Overall ADL's : Needs assistance/impaired         Upper Body Bathing: Bed level;Moderate assistance   Lower Body Bathing: Bed level;Total assistance   Upper Body Dressing : Bed level;Total assistance   Lower Body Dressing: Bed level;Total assistance     Toilet Transfer Details (indicate cue type and reason): does not get out of bed. patients sister reported that patient gets "shakes" when standing and is unable to at this time after ankle fracture patient was unable to stand or attempt hopping limiting time out of bed. "nerve" issues in hips/legs limit ability to engage in sitting up as well. Toileting- Clothing Manipulation and Hygiene: Total assistance;Bed level         General ADL Comments: patient and sister in room reporting that patient needed HEP to be able to complete in bed. patient was educated on importance of ROM movements for BLE and BUE. patient verbalized and demonstrated understanding.     Vision   Vision Assessment?: No apparent visual deficits            Pertinent Vitals/Pain Pain Assessment Pain Assessment: Faces Faces Pain Scale: Hurts little more Pain Location: back and BLE with movement Pain Descriptors / Indicators: Discomfort, Grimacing  Pain Intervention(s): Premedicated before session, Limited activity within patient's tolerance, Monitored during session     Extremity/Trunk Assessment Upper Extremity Assessment Upper Extremity Assessment: RUE deficits/detail;LUE deficits/detail RUE Deficits / Details: BUE ROM WFL LUE Deficits / Details: BUE ROM WFL   Lower Extremity Assessment Lower Extremity Assessment: RLE  deficits/detail RLE Deficits / Details: limited to about 10 degrees of movement in knee sister reported having arthritis.       Communication Communication Communication: Hearing impairment   Cognition Arousal: Alert Behavior During Therapy: WFL for tasks assessed/performed Overall Cognitive Status: Within Functional Limits for tasks assessed         General Comments: HOH with sister in room during session. patietns sister was able to answer most of questions during session for patient.                Home Living Family/patient expects to be discharged to:: Private residence Living Arrangements: Other relatives (sister) Available Help at Discharge: Family;Available 24 hours/day Type of Home: House          Prior Functioning/Environment Prior Level of Function : Needs assist               ADLs Comments: patient has been bed bound since ankle fracture.        OT Problem List: Decreased range of motion;Decreased activity tolerance;Impaired UE functional use      OT Treatment/Interventions: Patient/family education;Therapeutic exercise;Self-care/ADL training    OT Goals(Current goals can be found in the care plan section) Acute Rehab OT Goals Patient Stated Goal: to get back home OT Goal Formulation: With patient/family Time For Goal Achievement: 03/09/23 Potential to Achieve Goals: Fair  OT Frequency: Min 1X/week       AM-PAC OT "6 Clicks" Daily Activity     Outcome Measure Help from another person eating meals?: A Little Help from another person taking care of personal grooming?: A Little Help from another person toileting, which includes using toliet, bedpan, or urinal?: A Lot Help from another person bathing (including washing, rinsing, drying)?: A Lot Help from another person to put on and taking off regular upper body clothing?: A Lot Help from another person to put on and taking off regular lower body clothing?: A Lot 6 Click Score: 14   End of  Session Equipment Utilized During Treatment: (P) Gait belt (used as leg lifter.) Nurse Communication: Other (comment) (ok to participate)  Activity Tolerance: Patient tolerated treatment well Patient left: in bed;with call bell/phone within reach  OT Visit Diagnosis: Pain;Muscle weakness (generalized) (M62.81)                Time: 1610-9604 OT Time Calculation (min): 25 min Charges:  OT General Charges $OT Visit: 1 Visit OT Evaluation $OT Eval Low Complexity: 1 Low OT Treatments $Therapeutic Activity: 8-22 mins  Rosalio Loud, MS Acute Rehabilitation Department Office# [phone]   Selinda Flavin 02/23/2023, 1:31 PM

## 2023-02-23 NOTE — Progress Notes (Addendum)
Rounding Note    Patient Name: [name] Date of Encounter: 02/23/2023  [facility] Cardiologist: Chrystie Nose, MD   Subjective   No acute overnight events. He notes an occasional cough but no significant shortness of breath. Although, he is still on 2L of O2 via nasal cannula (he was not requiring any supplemental O2 at home). No chest pain or palpitations.   Inpatient Medications    Scheduled Meds:  atorvastatin  10 mg Oral QHS   dabigatran  150 mg Oral Q12H   diltiazem  300 mg Oral Daily   doxepin  150 mg Oral QHS   DULoxetine  60 mg Oral Daily   fenofibrate  160 mg Oral Daily   gabapentin  800 mg Oral TID   hydrocerin   Topical BID   insulin aspart  0-20 Units Subcutaneous TID WC   insulin glargine-yfgn  15 Units Subcutaneous Daily   lamoTRIgine  150 mg Oral BID   levETIRAcetam  1,500 mg Oral BID   multivitamin with minerals  1 tablet Oral Daily   omega-3 acid ethyl esters  1 g Oral BID   Continuous Infusions:  furosemide 120 mg (02/23/23 1054)   PRN Meds: acetaminophen **OR** acetaminophen, HYDROcodone-acetaminophen, ondansetron **OR** ondansetron (ZOFRAN) IV, mouth rinse   Vital Signs    Vitals:   02/22/23 1945 02/23/23 0428 02/23/23 0500 02/23/23 1212  BP: 117/78  126/61 123/68  Pulse: 89  82 75  Resp:    (!) 22  Temp: 97.6 F (36.4 C)  99.3 F (37.4 C) 97.6 F (36.4 C)  TempSrc: Oral  Oral   SpO2: 92%  95% 96%  Weight:  (!) 140.6 kg    Height:        Intake/Output Summary (Last 24 hours) at 02/23/2023 1238 Last data filed at 02/23/2023 0900 Gross per 24 hour  Intake 716 ml  Output 2200 ml  Net -1484 ml      02/23/2023    4:28 AM 02/22/2023    5:19 AM 02/21/2023    8:00 AM  Last 3 Weights  Weight (lbs) 309 lb 15.5 oz 312 lb 2.7 oz 312 lb 6.3 oz  Weight (kg) 140.6 kg 141.6 kg 141.7 kg      Telemetry    Atrial fibrillation with rates mostly in the 90s to low 100s (occasionally in the 120s). - Personally  Reviewed  ECG    No new ECG tracing today. - Personally Reviewed  Physical Exam   GEN: Morbidly obese Caucasian male resting comfortably in  no acute distress.   Neck: Unable to assess JVD due to body habitus. Cardiac: Irregularly irregular rhythm with borderline tachycardia.. No murmurs, rubs, or gallops.  Respiratory: Clear to auscultation bilaterally. No wheezes, rhonchi, or rales appreciated. GI: Soft, obese, and non-distended. S/p ileostomy.  MS: Trace pitting edema of bilateral lower extremities with chronic skin changes consistent with venous stasis. Neuro:  No focal deficits. Psych: Normal affect.  Labs    High Sensitivity Troponin:  No results for input(s): "TROPONINIHS" in the last 720 hours.   Chemistry Recent Labs  Lab 02/17/23 0436 02/18/23 0311 02/19/23 0523 02/20/23 0542 02/21/23 0544 02/22/23 0548 02/23/23 0520  NA 136 134* 135   < > 136 139 140  K 4.3 3.4* 3.8   < > 4.0 3.6 3.2*  CL 92* 90* 91*   < > 91* 94* 96*  CO2 33* 32 32   < > 34* 31 33*  GLUCOSE 138* 125*  139*   < > 162* 163* 186*  BUN 18 21 21    < > 31* 38* 44*  CREATININE 0.61 0.64 0.76   < > 0.77 0.72 0.85  CALCIUM 8.5* 8.5* 8.4*   < > 9.1 9.2 9.1  MG  --  2.0 2.0  --   --   --   --   PROT 7.0  --   --   --   --   --   --   ALBUMIN 3.2*  --   --   --   --   --   --   AST 20  --   --   --   --   --   --   ALT 11  --   --   --   --   --   --   ALKPHOS 36*  --   --   --   --   --   --   BILITOT 0.6  --   --   --   --   --   --   GFRNONAA >60 >60 >60   < > >60 >60 >60  ANIONGAP 11 12 12    < > 11 14 11    < > = values in this interval not displayed.    Lipids No results for input(s): "CHOL", "TRIG", "HDL", "LABVLDL", "LDLCALC", "CHOLHDL" in the last 168 hours.  Hematology Recent Labs  Lab 02/19/23 0523 02/20/23 0542 02/21/23 0544  WBC 8.0 9.9 9.6  RBC 4.57 4.64 4.82  HGB 12.2* 12.6* 12.8*  HCT 41.1 42.7 44.3  MCV 89.9 92.0 91.9  MCH 26.7 27.2 26.6  MCHC 29.7* 29.5* 28.9*  RDW 16.7*  16.9* 16.7*  PLT 257 261 302   Thyroid No results for input(s): "TSH", "FREET4" in the last 168 hours.  BNPNo results for input(s): "BNP", "PROBNP" in the last 168 hours.  DDimer No results for input(s): "DDIMER" in the last 168 hours.   Radiology    No results found.  Cardiac Studies   Echocardiogram 02/16/2023: 1. Left ventricular ejection fraction, by estimation, is 60 to 65%. The  left ventricle has normal function. The left ventricle has no regional  wall motion abnormalities. Left ventricular diastolic parameters are  indeterminate.   2. Right ventricular systolic function is normal. The right ventricular  size is normal.   3. The mitral valve is normal in structure. No evidence of mitral valve  regurgitation. No evidence of mitral stenosis.   4. The aortic valve is tricuspid. Aortic valve regurgitation is not  visualized. No aortic stenosis is present.  _______________   ABIs 02/16/2023: Summary:  Right: Resting right ankle-brachial index indicates noncompressible right  lower extremity arteries.   Possibly clacified, compression was present but at high pressures.  Left: Resting left ankle-brachial index is within normal range.     Patient Profile     72 y.o. 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 Robb Matar.   Assessment & Plan    Acute Hypoxic and Hypercarbic Respiratory Failure Patient presented to the ED after sister found him unresponsive.  HE was noted to be severely hypoxic on arrival  of EMS with O2 sats as low as the 60s. Venous blood gas in the ED showed pH of 7.31, pCO2 of 73, pO2 56, Bicarb  36.8. BNP mildly elevated at 198. Chest x-ray showed cardiopericardial enlargement with hazy appearance of the bilateral chest and interstitial opacities felt to be consistent with CHF. He was initially on BiPAP but now is on 4L of O2 via HFNC. - See recommendations for CHF as above.  - Otherwise, per primary team. Suspect respiratory failure is multifactorial (CHF, suspected obstructive sleep apnea/ obesity hypoventilation syndrome, and BMI). He would benefit from outpatient sleep study.   Acute HFpEF Patient presented to the ED after sister found him unresponsive.  HE was noted to be severely hypoxic on arrival of EMS with O2 sats as low as the 60s. Venous blood gas in the ED showed pH of 7.31, pCO2 of 73, pO2 56, Bicarb 36.8. BNP mildly elevated at 198. Chest x-ray showed cardiopericardial enlargement with hazy appearance of the bilateral chest and interstitial opacities felt to be consistent with CHF. Echo showed  LVEF of 60-65% with normal wall motion, normal RV size and function, and no significant valvular disease. He has been aggressively diuresed with IV Lasix - currently on 120mg  twice daily. Documented urinary output of 2.2 L yesterday and net negative 16.3L this admission. Weight down 18 lbs since admission. BUN is starting to rise but creatinine stable. - It is very difficult to assess volume status given his body habitus.  However, given BUN is rising, I think we may be getting close to euvolemic. Will discuss switching to PO Torsemide with MD. - Will start Spironolactone 25mg  daily.  - Would avoid SGLT2 inhibitors given he is bedbound and prone to UTIs. - Continue to monitor daily weights, strict I/Os, and renal function.   Persistent Atrial Fibrillation Patient has a history of long-standing persistent atrial fibrillation. Rates as high as the 140s on admission. - Rates in the 90s to low 100s.  - Will increase Diltiazem CD to 360mg  daily.  - Continue home Pradaxa 150mg  twice daily.    Hypertension BP well controlled.  - Continue Diltiazem CD 300mg  daily. - Will start Spironolactone 25mg  daily.  - Continue to hold home Losartan.    Hyperlipidemia - Continue Lipitor 10mg  daily.   PAD ABIs this admission showed non-compressible arteries on the right but were normal on the left. - Continue Pradaxa and Lipitor as above.   Hypokalemia Potassium 3.2 today.  - Will replete.  - Will start Spironolactone 25mg  daily.    Otherwise, per primary team: - Type 2 diabetes mellitus - Severe peripheral neuropathy - S/p colectomy and ileostomy  - Seizure disorder - Multiple DVT - Chronic pain - Sacral decubitus ulcer  For questions or updates, please contact [facility] Please consult [email or web address] for contact info under        Signed, Corrin Parker, PA-C  02/23/2023, 12:38 PM

## 2023-02-23 NOTE — Plan of Care (Signed)
  Problem: Education: Goal: Ability to describe self-care measures that may prevent or decrease complications (Diabetes Survival Skills Education) will improve Outcome: Progressing Goal: Individualized Educational Video(s) Outcome: Progressing   Problem: Coping: Goal: Ability to adjust to condition or change in health will improve Outcome: Progressing   Problem: Fluid Volume: Goal: Ability to maintain a balanced intake and output will improve Outcome: Progressing   Problem: Nutritional: Goal: Maintenance of adequate nutrition will improve Outcome: Progressing Goal: Progress toward achieving an optimal weight will improve Outcome: Progressing   Problem: Education: Goal: Knowledge of General Education information will improve Description: Including pain rating scale, medication(s)/side effects and non-pharmacologic comfort measures Outcome: Progressing   Problem: Activity: Goal: Risk for activity intolerance will decrease Outcome: Progressing   Problem: Nutrition: Goal: Adequate nutrition will be maintained Outcome: Progressing   Problem: Pain Managment: Goal: General experience of comfort will improve Outcome: Progressing   Problem: Safety: Goal: Ability to remain free from injury will improve Outcome: Progressing   Problem: Skin Integrity: Goal: Risk for impaired skin integrity will decrease Outcome: Progressing   Problem: Elimination: Goal: Will not experience complications related to bowel motility Outcome: Progressing Goal: Will not experience complications related to urinary retention Outcome: Progressing

## 2023-02-23 NOTE — Inpatient Diabetes Management (Signed)
Inpatient Diabetes Program Recommendations  AACE/ADA: New Consensus Statement on Inpatient Glycemic Control (2015)  Target Ranges:  Prepandial:   less than 140 mg/dL      Peak postprandial:   less than 180 mg/dL (1-2 hours)      Critically ill patients:  140 - 180 mg/dL   Lab Results  Component Value Date   GLUCAP 192 (H) 02/23/2023   HGBA1C 8.0 (H) 02/16/2023    Review of Glycemic Control  Latest Reference Range & Units 02/22/23 07:54 02/22/23 11:22 02/22/23 16:26 02/22/23 22:01 02/23/23 07:39  Glucose-Capillary 70 - 99 mg/dL 409 (H) 811 (H) 914 (H) 213 (H) 192 (H)   Diabetes history: DM 2 Outpatient Diabetes medications:  Glucotrol XL 2.5 mg bid Metformin 1000 mg bid Current orders for Inpatient glycemic control:  Novolog 0-20 units tid with meals Inpatient Diabetes Program Recommendations:    While in the hospital, consider adding Semglee 15 units daily.  Can likely restart oral medications (Metformin, Glucotrol) at discharge.    Thanks,  Beryl Meager, RN, BC-ADM Inpatient Diabetes Coordinator Pager [phone]  (8a-5p)

## 2023-02-23 NOTE — Progress Notes (Signed)
   02/23/23 2242  BiPAP/CPAP/SIPAP  BiPAP/CPAP/SIPAP DREAMSTATIOND  Reason BIPAP/CPAP not in use Non-compliant

## 2023-02-24 DIAGNOSIS — J9601 Acute respiratory failure with hypoxia: Secondary | ICD-10-CM | POA: Diagnosis not present

## 2023-02-24 DIAGNOSIS — J9602 Acute respiratory failure with hypercapnia: Secondary | ICD-10-CM | POA: Diagnosis not present

## 2023-02-24 DIAGNOSIS — I10 Essential (primary) hypertension: Secondary | ICD-10-CM | POA: Diagnosis not present

## 2023-02-24 DIAGNOSIS — I4891 Unspecified atrial fibrillation: Secondary | ICD-10-CM | POA: Diagnosis not present

## 2023-02-24 DIAGNOSIS — I5031 Acute diastolic (congestive) heart failure: Secondary | ICD-10-CM | POA: Diagnosis not present

## 2023-02-24 LAB — GLUCOSE, CAPILLARY
Glucose-Capillary: 184 mg/dL — ABNORMAL HIGH (ref 70–99)
Glucose-Capillary: 236 mg/dL — ABNORMAL HIGH (ref 70–99)
Glucose-Capillary: 224 mg/dL — ABNORMAL HIGH (ref 70–99)
Glucose-Capillary: 269 mg/dL — ABNORMAL HIGH (ref 70–99)

## 2023-02-24 LAB — BASIC METABOLIC PANEL
Anion gap: 11 (ref 5–15)
BUN: 42 mg/dL — ABNORMAL HIGH (ref 8–23)
CO2: 33 mmol/L — ABNORMAL HIGH (ref 22–32)
Calcium: 9 mg/dL (ref 8.9–10.3)
Chloride: 95 mmol/L — ABNORMAL LOW (ref 98–111)
Creatinine, Ser: 0.84 mg/dL (ref 0.61–1.24)
GFR, Estimated: >60 mL/min (ref >60)
Glucose, Bld: 174 mg/dL — ABNORMAL HIGH (ref 70–99)
Potassium: 3.4 mmol/L — ABNORMAL LOW (ref 3.5–5.1)
Sodium: 139 mmol/L (ref 135–145)

## 2023-02-24 LAB — MAGNESIUM: Magnesium: 2.1 mg/dL (ref 1.7–2.4)

## 2023-02-24 MED ORDER — TORSEMIDE 20 MG PO TABS
60.0000 mg | ORAL_TABLET | Freq: Two times a day (BID) | ORAL | Status: DC
  Administered 2023-02-24 – 2023-02-27 (×7): 60 mg via ORAL
  Filled 2023-02-24 (×7): qty 3

## 2023-02-24 MED ORDER — POTASSIUM CHLORIDE CRYS ER 20 MEQ PO TBCR
40.0000 meq | EXTENDED_RELEASE_TABLET | Freq: Two times a day (BID) | ORAL | Status: AC
  Administered 2023-02-24 (×2): 40 meq via ORAL
  Filled 2023-02-24 (×2): qty 2

## 2023-02-24 NOTE — Plan of Care (Signed)
  Problem: Coping: Goal: Ability to adjust to condition or change in health will improve Outcome: Progressing   Problem: Fluid Volume: Goal: Ability to maintain a balanced intake and output will improve Outcome: Progressing   Problem: Metabolic: Goal: Ability to maintain appropriate glucose levels will improve Outcome: Progressing   Problem: Nutritional: Goal: Maintenance of adequate nutrition will improve Outcome: Progressing   Problem: Clinical Measurements: Goal: Will remain free from infection Outcome: Progressing   Problem: Activity: Goal: Risk for activity intolerance will decrease Outcome: Progressing   Problem: Nutrition: Goal: Adequate nutrition will be maintained Outcome: Progressing

## 2023-02-24 NOTE — Plan of Care (Signed)

## 2023-02-24 NOTE — Progress Notes (Signed)
PROGRESS NOTE    KRISHAWN VANDERWEELE  YQI:[id number] DOB: 1951-02-21 DOA: 02/16/2023 PCP: Caffie Damme, MD   Brief Narrative: [name] is a 72 y.o. 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  Patient presented secondary to shortness of breath and was found to have respiratory failure secondary to acute heart failure. Cardiology consulted. IV diuresis started.   Assessment and Plan:  Acute respiratory failure with hypoxia and hypercapnia Appears to be secondary to acute heart failure.  Patient has been weaned to room air.  Resolved.  Acute heart failure with preserved EF Cardiology consulted.  Patient started on Lasix IV diuresis for management.  Echocardiogram significant for an LVEF of 60 to 65% with indeterminate diastolic parameters and no regional wall motion abnormality.  Urine output of 2.2 L Nover the last 24 hours. Weight of 327 lbs on admission. Weight of 310 lbs today. BUN continues trending upward; creatinine elevated. -Cardiology recommendations: transition to torsemide; added spironolactone  Permanent atrial fibrillation Intermittent rapid ventricular rate.  Patient is on diltiazem and Pradaxa as an outpatient. -Cardiology recommendations: increased to diltiazem 360 mg daily -Continue Pradaxa  Primary hypertension Patient is on diltiazem and losartan as an outpatient.  Losartan held secondary to aggressive diuresis. -Continue diltiazem  Diabetes mellitus type 2 Poorly controlled with hyperglycemia and hemoglobin A1c of 8.0%.  Patient is managed on glipizide as an outpatient, which were held on admission.  Sliding scale insulin initiated on admission. -Continue sliding scale insulin  Seizure disorder Patient is on Lamictal and Keppra as an outpatient.-Continue  Lamictal and Keppra  Peripheral neuropathy Chronic pain Patient is on gabapentin and Cymbalta as an outpatient. -Continue gabapentin and Cymbalta  Obesity Estimated body mass index is 36.78 kg/m as calculated from the following:   Height as of this encounter: 6\' 5"  (1.956 m).   Weight as of this encounter: 140.7 kg.  History of DVT -Continue Pradaxa  Hyperlipidemia Lipitor and omega-3 fatty acid  S/p ileostomy Noted.  Pressure injury Left buttocks. Present on admission.  Bedbound status Stable. OT consulted.   DVT prophylaxis: Pradaxa Code Status:   Code Status: Full Code Family Communication: None at bedside. Disposition Plan: Home likely in 1  day pending continued cardiology recommendations   Consultants:  Cardiology  Procedures:  Transthoracic Echocardiogram  Antimicrobials: None    Subjective: No issues from overnight  Objective: BP (!) 130/93   Pulse (!) 56   Temp 97.8 F (36.6 C) (Oral)   Resp 20   Ht 6\' 5"  (1.956 m)   Wt (!) 140.7 kg   SpO2 93%   BMI 36.78 kg/m   Examination:  General exam: Appears calm and comfortable Respiratory system: Clear to auscultation. Respiratory effort normal. Cardiovascular system: S1 & S2 heard, irregular rhythm Gastrointestinal system: Abdomen is soft and nontender.  Normal bowel sounds heard. Central nervous system: Alert and oriented. No focal neurological deficits. Psychiatry: Judgement and insight appear normal. Mood & affect appropriate.    Data Reviewed: I have personally reviewed following labs and imaging studies  CBC Lab Results  Component Value Date   WBC 9.6 02/21/2023   RBC 4.82 02/21/2023   HGB 12.8 (L) 02/21/2023   HCT 44.3 02/21/2023   MCV 91.9 02/21/2023   MCH 26.6 02/21/2023   PLT 302 02/21/2023   MCHC 28.9 (L) 02/21/2023   RDW 16.7 (H) 02/21/2023  LYMPHSABS 0.8 02/16/2023   MONOABS 0.6 02/16/2023   EOSABS 0.3 02/16/2023   BASOSABS 0.0 02/16/2023     Last metabolic  panel Lab Results  Component Value Date   NA 139 02/24/2023   K 3.4 (L) 02/24/2023   CL 95 (L) 02/24/2023   CO2 33 (H) 02/24/2023   BUN 42 (H) 02/24/2023   CREATININE 0.84 02/24/2023   GLUCOSE 174 (H) 02/24/2023   GFRNONAA >60 02/24/2023   GFRAA 128 11/26/2016   CALCIUM 9.0 02/24/2023   PHOS 2.4 (L) 02/16/2023   PROT 7.0 02/17/2023   ALBUMIN 3.2 (L) 02/17/2023   LABGLOB 3.1 11/26/2016   AGRATIO 1.1 (L) 11/26/2016   BILITOT 0.6 02/17/2023   ALKPHOS 36 (L) 02/17/2023   AST 20 02/17/2023   ALT 11 02/17/2023   ANIONGAP 11 02/24/2023    GFR: Estimated Creatinine Clearance: 127 mL/min (by C-G formula based on SCr of 0.84 mg/dL).  Recent Results (from the past 240 hour(s))  Blood Culture (routine x 2)     Status: None   Collection Time: 02/16/23  1:45 AM   Specimen: BLOOD RIGHT HAND  Result Value Ref Range Status   Specimen Description BLOOD RIGHT HAND  Final   Special Requests   Final    BOTTLES DRAWN AEROBIC AND ANAEROBIC Blood Culture adequate volume   Culture   Final    NO GROWTH 5 DAYS Performed at [facility], [address] [place], [place] [address]    Report Status 02/21/2023 FINAL  Final  Blood Culture (routine x 2)     Status: None   Collection Time: 02/16/23  2:04 AM   Specimen: BLOOD  Result Value Ref Range Status   Specimen Description BLOOD SITE NOT SPECIFIED  Final   Special Requests   Final    BOTTLES DRAWN AEROBIC AND ANAEROBIC Blood Culture results may not be optimal due to an excessive volume of blood received in culture bottles   Culture   Final    NO GROWTH 5 DAYS Performed at [facility], [address], [place], [place] [address]    Report Status 02/21/2023 FINAL  Final  Urine Culture     Status: Abnormal   Collection Time: 02/16/23  8:27 AM   Specimen: Urine, Random  Result Value Ref Range Status   Specimen Description   Final    URINE, RANDOM Performed at [facility], [address], [place], [place]  [address]    Special Requests   Final    NONE Reflexed from [phone] [phone] Performed at [facility], [address], [place], [place] [address]    Culture MULTIPLE SPECIES PRESENT, SUGGEST RECOLLECTION (A)  Final   Report Status 02/17/2023 FINAL  Final  MRSA Next Gen by PCR, Nasal     Status: None   Collection Time: 02/16/23 12:05 PM   Specimen: Nasal Mucosa; Nasal Swab  Result Value Ref Range Status   MRSA by PCR Next Gen NOT DETECTED NOT DETECTED Final    Comment: (NOTE) The GeneXpert MRSA Assay (FDA approved for NASAL specimens only), is one component of a comprehensive MRSA colonization surveillance program. It is not intended to diagnose MRSA infection nor to guide or monitor treatment for MRSA infections. Test performance is not FDA approved in patients less than 51 years old. Performed at [facility], [address] [place], [place] [address]       Radiology Studies: No results found.    LOS:  8 days    Jacquelin Hawking, MD [facility] 02/24/2023, 1:38 PM   If 7PM-7AM, please contact night-coverage [email or web address]

## 2023-02-24 NOTE — Progress Notes (Signed)
Rounding Note    Patient Name: [name] Date of Encounter: 02/24/2023  [facility] Cardiologist: Chrystie Nose, MD   Subjective   No acute overnight events. Sleeping soundly this morning and quickly fell back asleep after I woke him up.  No significant shortness of breath, chest pain, or palpitations.   Inpatient Medications    Scheduled Meds:  atorvastatin  10 mg Oral QHS   dabigatran  150 mg Oral Q12H   diltiazem  360 mg Oral Daily   doxepin  150 mg Oral QHS   DULoxetine  60 mg Oral Daily   fenofibrate  160 mg Oral Daily   gabapentin  800 mg Oral TID   hydrocerin   Topical BID   insulin aspart  0-20 Units Subcutaneous TID WC   insulin glargine-yfgn  15 Units Subcutaneous Daily   lamoTRIgine  150 mg Oral BID   levETIRAcetam  1,500 mg Oral BID   multivitamin with minerals  1 tablet Oral Daily   omega-3 acid ethyl esters  1 g Oral BID   spironolactone  25 mg Oral Daily   torsemide  40 mg Oral BID   Continuous Infusions:  PRN Meds: acetaminophen **OR** acetaminophen, HYDROcodone-acetaminophen, ondansetron **OR** ondansetron (ZOFRAN) IV, mouth rinse   Vital Signs    Vitals:   02/23/23 1212 02/23/23 2003 02/24/23 0450 02/24/23 0453  BP: 123/68 123/67 (!) 121/93   Pulse: 75 99 (!) 56   Resp: (!) 22 18 20    Temp: 97.6 F (36.4 C) 97.7 F (36.5 C) 97.8 F (36.6 C)   TempSrc: Oral Oral Oral   SpO2: 96% 95% 93%   Weight:    (!) 140.7 kg  Height:        Intake/Output Summary (Last 24 hours) at 02/24/2023 0743 Last data filed at 02/23/2023 2243 Gross per 24 hour  Intake 236 ml  Output 550 ml  Net -314 ml      02/24/2023    4:53 AM 02/23/2023    4:28 AM 02/22/2023    5:19 AM  Last 3 Weights  Weight (lbs) 310 lb 3 oz 309 lb 15.5 oz 312 lb 2.7 oz  Weight (kg) 140.7 kg 140.6 kg 141.6 kg      Telemetry    Atrial fibrillation with rates in the 90s to 110s at rest.  - Personally Reviewed  ECG    No new ECG tracings today. - Personally  Reviewed  Physical Exam   GEN: Morbidly obese Caucasian male resting comfortably in  no acute distress.   Neck: Unable to assess JVD due to body habitus. Cardiac: Borderline tachycardic with irregularly irregular rhythm. No murmurs, rubs, or gallops.  Respiratory: Clear to auscultation anteriorly. No wheezes, rhonchi, or rales appreciated. GI: Soft, obese, and non-distended. S/p ileostomy.  MS: Trace pitting edema of bilateral lower extremities with chronic skin changes consistent with venous stasis. Neuro:  No focal deficits. Psych: Normal affect.  Labs    High Sensitivity Troponin:  No results for input(s): "TROPONINIHS" in the last 720 hours.   Chemistry Recent Labs  Lab 02/18/23 0311 02/19/23 0523 02/20/23 0542 02/22/23 0548 02/23/23 0520 02/24/23 0524  NA 134* 135   < > 139 140 139  K 3.4* 3.8   < > 3.6 3.2* 3.4*  CL 90* 91*   < > 94* 96* 95*  CO2 32 32   < > 31 33* 33*  GLUCOSE 125* 139*   < > 163* 186* 174*  BUN 21  21   < > 38* 44* 42*  CREATININE 0.64 0.76   < > 0.72 0.85 0.84  CALCIUM 8.5* 8.4*   < > 9.2 9.1 9.0  MG 2.0 2.0  --   --   --   --   GFRNONAA >60 >60   < > >60 >60 >60  ANIONGAP 12 12   < > 14 11 11    < > = values in this interval not displayed.    Lipids No results for input(s): "CHOL", "TRIG", "HDL", "LABVLDL", "LDLCALC", "CHOLHDL" in the last 168 hours.  Hematology Recent Labs  Lab 02/19/23 0523 02/20/23 0542 02/21/23 0544  WBC 8.0 9.9 9.6  RBC 4.57 4.64 4.82  HGB 12.2* 12.6* 12.8*  HCT 41.1 42.7 44.3  MCV 89.9 92.0 91.9  MCH 26.7 27.2 26.6  MCHC 29.7* 29.5* 28.9*  RDW 16.7* 16.9* 16.7*  PLT 257 261 302   Thyroid No results for input(s): "TSH", "FREET4" in the last 168 hours.  BNPNo results for input(s): "BNP", "PROBNP" in the last 168 hours.  DDimer No results for input(s): "DDIMER" in the last 168 hours.   Radiology    No results found.  Cardiac Studies   Echocardiogram 02/16/2023: 1. Left ventricular ejection fraction, by  estimation, is 60 to 65%. The  left ventricle has normal function. The left ventricle has no regional  wall motion abnormalities. Left ventricular diastolic parameters are  indeterminate.   2. Right ventricular systolic function is normal. The right ventricular  size is normal.   3. The mitral valve is normal in structure. No evidence of mitral valve  regurgitation. No evidence of mitral stenosis.   4. The aortic valve is tricuspid. Aortic valve regurgitation is not  visualized. No aortic stenosis is present.  _______________   ABIs 02/16/2023: Summary:  Right: Resting right ankle-brachial index indicates noncompressible right  lower extremity arteries.   Possibly clacified, compression was present but at high pressures.  Left: Resting left ankle-brachial index is within normal range.   Patient Profile     72 y.o. 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 He was noted to be in acute hypoxic and hypercarbic respiratory failure with pCO2 of 73 initially requiring BiPAP. Cardiology was consulted for evaluation of acute CHF at the request of Dr. Robb Matar.   Assessment & Plan    Acute Hypoxic and Hypercarbic Respiratory Failure Patient presented to the ED after sister found him unresponsive.  HE was noted to be severely hypoxic on arrival of EMS with O2 sats as low as the 60s. Venous blood gas in the ED showed pH of 7.31, pCO2 of 73, pO2 56, Bicarb 36.8. BNP mildly elevated at 198. Chest x-ray showed cardiopericardial enlargement with hazy appearance of the bilateral chest and interstitial opacities felt to be consistent with  CHF. He was initially on BiPAP but now is on 4L of O2 via HFNC. - See recommendations for CHF as above.  - Otherwise, per primary team. Suspect respiratory failure is multifactorial (CHF, suspected obstructive sleep apnea/ obesity hypoventilation syndrome, and BMI). He would benefit from outpatient sleep study (would do split night sleep study not home sleep study). We can help arrange this  as an outpatient.   Acute HFpEF Patient presented to the ED after sister found him unresponsive.  HE was noted to be severely hypoxic on arrival of EMS with O2 sats as low as the 60s. Venous blood gas in the ED showed pH of 7.31, pCO2 of 73, pO2 56, Bicarb 36.8. BNP mildly elevated at 198. Chest x-ray showed cardiopericardial enlargement with hazy appearance of the bilateral chest and interstitial opacities felt to be consistent with CHF. Echo showed  LVEF of 60-65% with normal wall motion, normal RV size and function, and no significant valvular disease. He was aggressively diuresed with IV Lasix for several days and then switched to Torsemide on 10/14.  Documented urinary output of 2.2 L yesterday and net negative 16.8L this admission. Weight down 17 lbs since admission. Renal function is stable. - It is very difficult to assess volume status given his body habitus.   - Continue Torsemide 40mg  twice daily.  - Continue Spironolactone 25mg  daily (started on 10/14).  - Would avoid SGLT2 inhibitors given he is bedbound and prone to UTIs. - Continue to monitor daily weights, strict I/Os, and renal function.   Persistent Atrial Fibrillation Patient has a history of long-standing persistent atrial fibrillation. Rates as high as the 140s on admission. - Rates in the 90s to low 110s.  - Diltiazem CD was increased from 300mg  to 360mg  daily yesterday. He is getting the first dose of 360mg  this morning. - Can add Lopressor as well if needed but will see how he does on higher dose of Diltiazem. - Continue home Pradaxa 150mg   twice daily.   Hypertension BP mostly well controlled.  - Continue Diltiazem CD 300mg  daily. - Continue Spironolactone 25mg  daily.  - Continue to hold home Losartan.    Hyperlipidemia - Continue Lipitor 10mg  daily.   PAD ABIs this admission showed non-compressible arteries on the right but were normal on the left. - Continue Pradaxa and Lipitor as above.   Hypokalemia Potassium 3.4 today.  - Will replete.  - Continue Spironolactone 25mg  daily.  - Will recheck Magnesium as well.   Otherwise, per primary team: - Type 2 diabetes mellitus - Severe peripheral neuropathy - S/p colectomy and ileostomy  - Seizure disorder - Multiple DVT - Chronic pain - Sacral decubitus ulcer  For questions or updates, please contact [facility] Please consult [email or web address] for contact info under        Signed, Corrin Parker, PA-C  02/24/2023, 7:43 AM

## 2023-02-24 NOTE — Progress Notes (Signed)
Pt adamantly refused dressing change from me or Revonda Standard. This is the second night in a row that the dressing change was refused during night shift. Pt was educated about refusing the dressing change. Pt stated that he will have it changed when he gets a bath during day shift.

## 2023-02-24 NOTE — Progress Notes (Signed)
Occupational Therapy Treatment Patient Details Name: [name] MRN: [id number] DOB: 19-Mar-1951 Today's Date: 02/24/2023   History of present illness Patient is a 72 year old male who presented on 10/7 with increased shortness of breath. Patient was admitted with acute respiratory failure with hypoxia and hypercapnia, acute heart failure with preserved EF, permanent A fib, PMH: seizure disorder, peripheral neuropathy, HTN, obesity, DVT, pressure injury,   OT comments  Patient and sister were educated on HEP and it being completed in addition to all normal  ADLs. Patient and sister verbalized understanding. Patient and sister were educated on strategies to participate in HEP as independently as possible. Patient and sister were also educated on rolling to reduce pressure on bottom during the day. Patient and sister verbalized understanding. D/c from skilled OT services at this time.       If plan is discharge home, recommend the following:  Assistance with cooking/housework;Direct supervision/assist for medications management;Assist for transportation;Help with stairs or ramp for entrance;Direct supervision/assist for financial management;A lot of help with bathing/dressing/bathroom   Equipment Recommendations  None recommended by OT       Precautions / Restrictions Precautions Precaution Comments: monitor HR and O2 Restrictions Weight Bearing Restrictions: No              ADL either performed or assessed with clinical judgement   ADL Overall ADL's : Needs assistance/impaired         General ADL Comments: when asked how rolling had gone since last session, patient reported he has not needed to roll with nursing staff since therapy was last present. patient was educated that with pressure injuries the best thing for them was getting pressure off bottom completely. patients sister reported that he has a special pad at home that he uses in bed to reduce pressure. patient and  sister were educated on rolling to get pressure off bottom each hour. patients sister reported that he is unable to hold sidelying fully without additional support from someone else. patient and sister were reminded that patient did hold partial side lying during last session and that he did this with no physical A from therapist just a few cues. patient's sister verbalized understanding.      Cognition Arousal: Alert Behavior During Therapy: WFL for tasks assessed/performed         General Comments: patient HOH with need for increased cues at times. patients sister was present during session as well. did not appear invested in education.        Exercises General Exercises - Upper Extremity Shoulder Horizontal ABduction: AROM, 5 reps, Theraband Theraband Level (Shoulder Horizontal Abduction): Level 2 (Red) Elbow Flexion: 5 reps, Strengthening, Right, Left, Theraband Theraband Level (Elbow Flexion): Level 2 (Red) Elbow Extension: AROM, Left, Right, 5 reps, Theraband Theraband Level (Elbow Extension): Level 2 (Red)            Pertinent Vitals/ Pain       Pain Assessment Pain Assessment: No/denies pain         Frequency  Min 1X/week        Progress Toward Goals  OT Goals(current goals can now be found in the care plan section)  Progress towards OT goals: Goals met/education completed, patient discharged from OT      Activity Tolerance Patient tolerated treatment well   Patient Left in bed;with call bell/phone within reach;with family/visitor present   Nurse Communication Other (comment) (ok to participate in session)        Time: 1201-1219 OT  Time Calculation (min): 18 min  Charges: OT General Charges $OT Visit: 1 Visit OT Treatments $Therapeutic Activity: 8-22 mins  Rosalio Loud, MS Acute Rehabilitation Department Office# [phone]   Selinda Flavin 02/24/2023, 12:56 PM

## 2023-02-25 DIAGNOSIS — I4891 Unspecified atrial fibrillation: Secondary | ICD-10-CM | POA: Diagnosis not present

## 2023-02-25 DIAGNOSIS — J9602 Acute respiratory failure with hypercapnia: Secondary | ICD-10-CM | POA: Diagnosis not present

## 2023-02-25 DIAGNOSIS — I10 Essential (primary) hypertension: Secondary | ICD-10-CM | POA: Diagnosis not present

## 2023-02-25 DIAGNOSIS — I509 Heart failure, unspecified: Secondary | ICD-10-CM

## 2023-02-25 DIAGNOSIS — I5031 Acute diastolic (congestive) heart failure: Secondary | ICD-10-CM | POA: Diagnosis not present

## 2023-02-25 DIAGNOSIS — J9601 Acute respiratory failure with hypoxia: Secondary | ICD-10-CM | POA: Diagnosis not present

## 2023-02-25 LAB — BASIC METABOLIC PANEL
Anion gap: 14 (ref 5–15)
BUN: 41 mg/dL — ABNORMAL HIGH (ref 8–23)
CO2: 30 mmol/L (ref 22–32)
Calcium: 9.5 mg/dL (ref 8.9–10.3)
Chloride: 100 mmol/L (ref 98–111)
Creatinine, Ser: 0.83 mg/dL (ref 0.61–1.24)
GFR, Estimated: >60 mL/min (ref >60)
Glucose, Bld: 192 mg/dL — ABNORMAL HIGH (ref 70–99)
Potassium: 4.4 mmol/L (ref 3.5–5.1)
Sodium: 144 mmol/L (ref 135–145)

## 2023-02-25 LAB — GLUCOSE, CAPILLARY
Glucose-Capillary: 171 mg/dL — ABNORMAL HIGH (ref 70–99)
Glucose-Capillary: 245 mg/dL — ABNORMAL HIGH (ref 70–99)
Glucose-Capillary: 247 mg/dL — ABNORMAL HIGH (ref 70–99)
Glucose-Capillary: 238 mg/dL — ABNORMAL HIGH (ref 70–99)

## 2023-02-25 MED ORDER — FLUTICASONE PROPIONATE 50 MCG/ACT NA SUSP
1.0000 | Freq: Every day | NASAL | Status: DC
  Administered 2023-02-25 – 2023-02-27 (×3): 1 via NASAL
  Filled 2023-02-25: qty 16

## 2023-02-25 MED ORDER — METOPROLOL SUCCINATE ER 25 MG PO TB24
25.0000 mg | ORAL_TABLET | Freq: Every day | ORAL | Status: DC
  Administered 2023-02-25 – 2023-02-26 (×2): 25 mg via ORAL
  Filled 2023-02-25 (×2): qty 1

## 2023-02-25 NOTE — Plan of Care (Signed)
  Problem: Nutritional: Goal: Progress toward achieving an optimal weight will improve Outcome: Progressing   Problem: Skin Integrity: Goal: Risk for impaired skin integrity will decrease Outcome: Progressing

## 2023-02-25 NOTE — Progress Notes (Signed)
Rounding Note    Patient Name: [name] Date of Encounter: 02/25/2023  [facility] Cardiologist: Chrystie Nose, MD   Subjective   Denies any chest pain or shortness of breath.  Diuresed well overnight putting out 1.8 L yesterday and is net -18.8 L since admission.  Inpatient Medications    Scheduled Meds:  atorvastatin  10 mg Oral QHS   dabigatran  150 mg Oral Q12H   diltiazem  360 mg Oral Daily   doxepin  150 mg Oral QHS   DULoxetine  60 mg Oral Daily   fenofibrate  160 mg Oral Daily   fluticasone  1 spray Each Nare Daily   gabapentin  800 mg Oral TID   hydrocerin   Topical BID   insulin aspart  0-20 Units Subcutaneous TID WC   insulin glargine-yfgn  15 Units Subcutaneous Daily   lamoTRIgine  150 mg Oral BID   levETIRAcetam  1,500 mg Oral BID   multivitamin with minerals  1 tablet Oral Daily   omega-3 acid ethyl esters  1 g Oral BID   spironolactone  25 mg Oral Daily   torsemide  60 mg Oral BID   Continuous Infusions:  PRN Meds: acetaminophen **OR** acetaminophen, HYDROcodone-acetaminophen, ondansetron **OR** ondansetron (ZOFRAN) IV, mouth rinse   Vital Signs    Vitals:   02/24/23 1934 02/25/23 0500 02/25/23 0537 02/25/23 1245  BP: 130/80  110/79 115/63  Pulse: (!) 110  81 (!) 104  Resp:      Temp: 97.6 F (36.4 C)  (!) 97.3 F (36.3 C)   TempSrc: Oral  Oral   SpO2: 93%  94% 95%  Weight:  (!) 139.4 kg    Height:        Intake/Output Summary (Last 24 hours) at 02/25/2023 1331 Last data filed at 02/25/2023 1249 Gross per 24 hour  Intake 450 ml  Output 2600 ml  Net -2150 ml      02/25/2023    5:00 AM 02/24/2023    4:53 AM 02/23/2023    4:28 AM  Last 3 Weights  Weight (lbs) 307 lb 5.1 oz 310 lb 3 oz 309 lb 15.5 oz  Weight (kg) 139.4 kg 140.7 kg 140.6 kg      Telemetry    -year-old fibrillation with rate in the low 100s- Personally Reviewed  ECG    No new ECG tracings today. - Personally Reviewed  Physical Exam   GEN:  Well nourished, well developed in no acute distress morbid obesity HEENT: Normal NECK: No JVD; No carotid bruits LYMPHATICS: No lymphadenopathy CARDIAC: Irregularly irregular, no murmurs, rubs, gallops RESPIRATORY:  Clear to auscultation without rales, wheezing or rhonchi  ABDOMEN: Soft, non-tender, non-distended MUSCULOSKELETAL:  No edema; No deformity  SKIN: Warm and dry NEUROLOGIC:  Alert and oriented x 3 PSYCHIATRIC:  Normal affect  Labs    High Sensitivity Troponin:  No results for input(s): "TROPONINIHS" in the last 720 hours.   Chemistry Recent Labs  Lab 02/19/23 0523 02/20/23 0542 02/23/23 0520 02/24/23 0524 02/25/23 0510  NA 135   < > 140 139 144  K 3.8   < > 3.2* 3.4* 4.4  CL 91*   < > 96* 95* 100  CO2 32   < > 33* 33* 30  GLUCOSE 139*   < > 186* 174* 192*  BUN 21   < > 44* 42* 41*  CREATININE 0.76   < > 0.85 0.84 0.83  CALCIUM 8.4*   < >  9.1 9.0 9.5  MG 2.0  --   --  2.1  --   GFRNONAA >60   < > >60 >60 >60  ANIONGAP 12   < > 11 11 14    < > = values in this interval not displayed.    Lipids No results for input(s): "CHOL", "TRIG", "HDL", "LABVLDL", "LDLCALC", "CHOLHDL" in the last 168 hours.  Hematology Recent Labs  Lab 02/19/23 0523 02/20/23 0542 02/21/23 0544  WBC 8.0 9.9 9.6  RBC 4.57 4.64 4.82  HGB 12.2* 12.6* 12.8*  HCT 41.1 42.7 44.3  MCV 89.9 92.0 91.9  MCH 26.7 27.2 26.6  MCHC 29.7* 29.5* 28.9*  RDW 16.7* 16.9* 16.7*  PLT 257 261 302   Thyroid No results for input(s): "TSH", "FREET4" in the last 168 hours.  BNPNo results for input(s): "BNP", "PROBNP" in the last 168 hours.  DDimer No results for input(s): "DDIMER" in the last 168 hours.   Radiology    No results found.  Cardiac Studies   Echocardiogram 02/16/2023: 1. Left ventricular ejection fraction, by estimation, is 60 to 65%. The  left ventricle has normal function. The left ventricle has no regional  wall motion abnormalities. Left ventricular diastolic parameters are   indeterminate.   2. Right ventricular systolic function is normal. The right ventricular  size is normal.   3. The mitral valve is normal in structure. No evidence of mitral valve  regurgitation. No evidence of mitral stenosis.   4. The aortic valve is tricuspid. Aortic valve regurgitation is not  visualized. No aortic stenosis is present.  _______________   ABIs 02/16/2023: Summary:  Right: Resting right ankle-brachial index indicates noncompressible right  lower extremity arteries.   Possibly clacified, compression was present but at high pressures.  Left: Resting left ankle-brachial index is within normal range.   Patient Profile     72 y.o. 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 He was noted to be in acute hypoxic and hypercarbic respiratory failure with pCO2 of 73 initially requiring BiPAP. Cardiology was consulted for evaluation of acute CHF at the request of Dr. Robb Matar.   Assessment & Plan    Acute Hypoxic and Hypercarbic Respiratory Failure Patient presented to the ED after sister found him unresponsive.  HE was noted to be severely hypoxic on arrival of EMS with O2 sats as low as the 60s. Venous blood gas in the ED showed pH of 7.31, pCO2 of 73, pO2 56, Bicarb 36.8. BNP mildly elevated at 198.  -Chest x-ray showed cardiopericardial enlargement with hazy appearance of the bilateral chest and interstitial opacities felt to be consistent with CHF.  -He was initially on BiPAP but now is on 4L of O2 via HFNC. -Suspect respiratory failure is multifactorial (CHF, suspected obstructive sleep apnea/ obesity  hypoventilation syndrome, and BMI).  -He would benefit from outpatient sleep study (would do split night sleep study not home sleep study). We can help arrange this as an outpatient.   Acute HFpEF -BNP mildly elevated at 198.  But may be falsely low in the setting of morbid obesity -chest x-ray showed cardiopericardial enlargement with hazy appearance of the bilateral chest and interstitial opacities felt to be  consistent with CHF.  -Echo showed  LVEF of 60-65% with normal wall motion, normal RV size and function, and no significant valvular disease.  -He was aggressively diuresed with IV Lasix for several days and then switched to Torsemide initially 40mg  twice daily but urine output dropped off so it was increased to 60 mg twice daily -Weight is down 3 pounds from yesterday and 20 pounds from admission -He put out 1.8 L yesterday and is net -18.8 L since admission. -Serum creatinine stable at 0.83, BUN 41 and CO2 30. -It is very difficult to assess volume status given his body habitus.   -Continue Torsemide 60 mg twice daily.  -Continue Spironolactone 25mg  daily (started on 10/14).  -Would avoid SGLT2 inhibitors given he is bedbound and prone to UTIs. -Continue to monitor daily weights, strict I/Os, and renal function.   Persistent Atrial Fibrillation Patient has a history of long-standing persistent atrial fibrillation. Rates as high as the 140s on admission. -Currently remains in atrial fibrillation with heart rate 90 to 100 bpm -Diltiazem CD was increased from 300mg  to 360mg  daily yesterday.  -BP stable at 115/63 mmHg -Add Toprol XL 25 mg daily for better heart rate control -Continue Pradaxa 150 mg twice daily   Hypertension -BP well-controlled -Continue Cardizem CD 360 mg daily, spironolactone 25 mg daily  -Starting Toprol XL 25 mg daily for better heart rate control  Hyperlipidemia - Continue Lipitor 10mg  daily.   PAD ABIs this admission showed non-compressible arteries on  the right but were normal on the left. - Continue Pradaxa and Lipitor as above. - Can be followed up outpatient   Hypokalemia -Potassium 4.4 today -Mag 2.1 -Continue spironolactone 25 mg daily and  potassium supplementation as needed  Otherwise, per primary team: - Type 2 diabetes mellitus - Severe peripheral neuropathy - S/p colectomy and ileostomy  - Seizure disorder - Multiple DVT - Chronic pain - Sacral decubitus ulcer  For questions or updates, please contact [facility] Please consult [email or web address] for contact info under    Total time spent with patient today 35 minutes. This includes reviewing records, evaluating the patient and coordinating care. Face-to-face time >50%.    Signed, Armanda Magic, MD  02/25/2023, 1:31 PM

## 2023-02-25 NOTE — Progress Notes (Signed)
   02/25/23 0000  BiPAP/CPAP/SIPAP  BiPAP/CPAP/SIPAP Pt Type Adult  BiPAP/CPAP/SIPAP DREAMSTATIOND  Mask Type Full face mask  Mask Size Large  IPAP 14 cmH20  EPAP 6 cmH2O  Flow Rate 2 lpm  Patient Home Equipment No  Auto Titrate No

## 2023-02-25 NOTE — Progress Notes (Signed)
   02/25/23 2319  BiPAP/CPAP/SIPAP  BiPAP/CPAP/SIPAP Pt Type Adult  Reason BIPAP/CPAP not in use Non-compliant (Patient refused BiPAP qhs.  Patient wished to sleep with his nasal cannula instead.  Patient encouraged to contact RT should he change his mind.)

## 2023-02-25 NOTE — Plan of Care (Addendum)
Educated and encouraged patient to try to burp and empty own colostomy bag. Problem: Education: Goal: Ability to describe self-care measures that may prevent or decrease complications (Diabetes Survival Skills Education) will improve Outcome: Progressing Goal: Individualized Educational Video(s) Outcome: Progressing   Problem: Nutritional: Goal: Maintenance of adequate nutrition will improve Outcome: Progressing Goal: Progress toward achieving an optimal weight will improve Outcome: Progressing   Problem: Skin Integrity: Goal: Risk for impaired skin integrity will decrease Outcome: Progressing   Problem: Tissue Perfusion: Goal: Adequacy of tissue perfusion will improve Outcome: Progressing

## 2023-02-25 NOTE — Inpatient Diabetes Management (Signed)
Inpatient Diabetes Program Recommendations  AACE/ADA: New Consensus Statement on Inpatient Glycemic Control (2015)  Target Ranges:  Prepandial:   less than 140 mg/dL      Peak postprandial:   less than 180 mg/dL (1-2 hours)      Critically ill patients:  140 - 180 mg/dL   Lab Results  Component Value Date   GLUCAP 171 (H) 02/25/2023   HGBA1C 8.0 (H) 02/16/2023    Review of Glycemic Control  Latest Reference Range & Units 02/23/23 23:08 02/24/23 07:25 02/24/23 11:45 02/24/23 16:42 02/24/23 21:07 02/25/23 07:47  Glucose-Capillary 70 - 99 mg/dL 161 (H) 096 (H) 045 (H) 224 (H) 269 (H) 171 (H)  Diabetes history: DM 2 Outpatient Diabetes medications:  Glucotrol XL 2.5 mg bid Metformin 1000 mg bid Current orders for Inpatient glycemic control:  Novolog 0-20 units tid with meals Semglee 15 units daily  Inpatient Diabetes Program Recommendations:    If appropriate, consider adding Novolog 2 units tid with meals (hold if patients eat less than 50% or NPO).   Thanks,  Beryl Meager, RN, BC-ADM Inpatient Diabetes Coordinator Pager [phone]  (8a-5p)

## 2023-02-25 NOTE — Progress Notes (Signed)
  Progress Note   Patient: [name]:[id number] DOB: 30-Jul-1950 DOA: 02/16/2023     9 DOS: the patient was seen and examined on 02/25/2023   Brief hospital course: ARTICE BERGERSON is a 72 y.o. 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  Patient presented secondary to shortness of breath and was found to have respiratory failure secondary to acute heart failure. Cardiology consulted. IV diuresis started.  Assessment and Plan: Acute respiratory failure with hypoxia and hypercapnia secondary to acute heart failure.  Patient has been weaned to room air.  Much improved -Recs for outpatient sleep study on d/c. Cardiology to arrange   Acute heart failure with preserved EF Cardiology consulted.  Patient started on Lasix IV diuresis for management.  Echocardiogram significant for an LVEF of 60 to 65% with indeterminate diastolic parameters and no regional wall motion abnormality.   -Cardiology recommendations: transitioned to torsemide; added spironolactone   Permanent atrial fibrillation Intermittent rapid ventricular rate.  Patient is on diltiazem and Pradaxa as an outpatient. -Cardiology recommendations: increased to diltiazem 360 mg daily -Continue Pradaxa -HR remains elevated this AM. Toprol XL 25mg  added   Primary hypertension Patient is on diltiazem and losartan as an outpatient.  Losartan held secondary to aggressive diuresis. -Continue diltiazem   Diabetes mellitus type 2 Poorly controlled with hyperglycemia and hemoglobin A1c of 8.0%.  Patient is managed on glipizide as an outpatient, which were held on admission.  Sliding scale insulin initiated on admission. -Continue sliding scale insulin   Seizure disorder Patient is on Lamictal and Keppra as an outpatient.-Continue  Lamictal and Keppra   Peripheral neuropathy Chronic pain Patient is on gabapentin and Cymbalta as an outpatient. -Continue gabapentin and Cymbalta   Obesity Estimated body mass index is 36.78 kg/m as calculated from the following:   Height as of this encounter: 6\' 5"  (1.956 m).   Weight as of this encounter: 140.7 kg.   History of DVT -Continue Pradaxa   Hyperlipidemia Lipitor and omega-3 fatty acid   S/p ileostomy Noted.   Pressure injury Left buttocks. Present on admission.   Bedbound status Stable. OT consulted.      Subjective: Without complaints today.   Physical Exam: Vitals:   02/24/23 1934 02/25/23 0500 02/25/23 0537 02/25/23 1245  BP: 130/80  110/79 115/63  Pulse: (!) 110  81 (!) 104  Resp:      Temp: 97.6 F (36.4 C)  (!) 97.3 F (36.3 C)   TempSrc: Oral  Oral   SpO2: 93%  94% 95%  Weight:  (!) 139.4 kg    Height:       General exam: Awake, laying in bed, in nad Respiratory system: Normal respiratory effort, no wheezing Cardiovascular system: regular rate, s1, s2 Gastrointestinal system: Soft, nondistended, positive BS Central nervous system: CN2-12 grossly intact, strength intact Extremities: Perfused, no clubbing Skin: Normal skin turgor, no notable skin lesions seen Psychiatry: Mood normal // no visual hallucinations   Data Reviewed:  Labs reviewed: Na 144, K 4.4, Cr 0.83  Family Communication: Pt in room, family at bedside  Disposition: Status is: Inpatient Remains inpatient appropriate because: severity of illness  Planned Discharge Destination: Home    Author: Rickey Barbara, MD 02/25/2023 6:24 PM  For on call review [email or web address].

## 2023-02-26 DIAGNOSIS — I5031 Acute diastolic (congestive) heart failure: Secondary | ICD-10-CM | POA: Diagnosis not present

## 2023-02-26 DIAGNOSIS — J9602 Acute respiratory failure with hypercapnia: Secondary | ICD-10-CM | POA: Diagnosis not present

## 2023-02-26 DIAGNOSIS — I509 Heart failure, unspecified: Secondary | ICD-10-CM | POA: Diagnosis not present

## 2023-02-26 DIAGNOSIS — E7800 Pure hypercholesterolemia, unspecified: Secondary | ICD-10-CM

## 2023-02-26 DIAGNOSIS — I4891 Unspecified atrial fibrillation: Secondary | ICD-10-CM | POA: Diagnosis not present

## 2023-02-26 DIAGNOSIS — J9601 Acute respiratory failure with hypoxia: Secondary | ICD-10-CM | POA: Diagnosis not present

## 2023-02-26 DIAGNOSIS — I10 Essential (primary) hypertension: Secondary | ICD-10-CM | POA: Diagnosis not present

## 2023-02-26 LAB — CBC
HCT: 42.3 % (ref 39.0–52.0)
Hemoglobin: 12.3 g/dL — ABNORMAL LOW (ref 13.0–17.0)
MCH: 27 pg (ref 26.0–34.0)
MCHC: 29.1 g/dL — ABNORMAL LOW (ref 30.0–36.0)
MCV: 92.8 fL (ref 80.0–100.0)
Platelets: 320 10*3/uL (ref 150–400)
RBC: 4.56 MIL/uL (ref 4.22–5.81)
RDW: 16.1 % — ABNORMAL HIGH (ref 11.5–15.5)
WBC: 8.4 10*3/uL (ref 4.0–10.5)
nRBC: 0 % (ref 0.0–0.2)

## 2023-02-26 LAB — GLUCOSE, CAPILLARY
Glucose-Capillary: 259 mg/dL — ABNORMAL HIGH (ref 70–99)
Glucose-Capillary: 202 mg/dL — ABNORMAL HIGH (ref 70–99)
Glucose-Capillary: 196 mg/dL — ABNORMAL HIGH (ref 70–99)
Glucose-Capillary: 226 mg/dL — ABNORMAL HIGH (ref 70–99)

## 2023-02-26 LAB — COMPREHENSIVE METABOLIC PANEL
ALT: 15 U/L (ref 0–44)
AST: 17 U/L (ref 15–41)
Albumin: 3.3 g/dL — ABNORMAL LOW (ref 3.5–5.0)
Alkaline Phosphatase: 42 U/L (ref 38–126)
Anion gap: 12 (ref 5–15)
BUN: 41 mg/dL — ABNORMAL HIGH (ref 8–23)
CO2: 31 mmol/L (ref 22–32)
Calcium: 9.2 mg/dL (ref 8.9–10.3)
Chloride: 99 mmol/L (ref 98–111)
Creatinine, Ser: 0.92 mg/dL (ref 0.61–1.24)
GFR, Estimated: >60 mL/min (ref >60)
Glucose, Bld: 180 mg/dL — ABNORMAL HIGH (ref 70–99)
Potassium: 4 mmol/L (ref 3.5–5.1)
Sodium: 142 mmol/L (ref 135–145)
Total Bilirubin: 0.6 mg/dL (ref 0.3–1.2)
Total Protein: 7.8 g/dL (ref 6.5–8.1)

## 2023-02-26 MED ORDER — METOPROLOL SUCCINATE ER 25 MG PO TB24
25.0000 mg | ORAL_TABLET | Freq: Once | ORAL | Status: DC
  Filled 2023-02-26: qty 1

## 2023-02-26 MED ORDER — METOPROLOL SUCCINATE ER 50 MG PO TB24: 50.0000 mg | ORAL_TABLET | Freq: Every day | ORAL | Status: DC

## 2023-02-26 MED ORDER — METOPROLOL TARTRATE 12.5 MG HALF TABLET
12.5000 mg | ORAL_TABLET | Freq: Once | ORAL | Status: AC
  Administered 2023-02-26: 12.5 mg via ORAL
  Filled 2023-02-26: qty 1

## 2023-02-26 MED ORDER — METOPROLOL SUCCINATE ER 25 MG PO TB24
37.5000 mg | ORAL_TABLET | Freq: Every day | ORAL | Status: DC
  Administered 2023-02-27: 37.5 mg via ORAL
  Filled 2023-02-26: qty 2

## 2023-02-26 NOTE — Progress Notes (Signed)
  Progress Note   Patient: [name] ZOX:[id number] DOB: 11/02/1950 DOA: 02/16/2023     10 DOS: the patient was seen and examined on 02/26/2023   Brief hospital course: [name] is a 72 y.o. 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  Patient presented secondary to shortness of breath and was found to have respiratory failure secondary to acute heart failure. Cardiology consulted. IV diuresis started.  Assessment and Plan: Acute respiratory failure with hypoxia and hypercapnia secondary to acute heart failure.  Patient has been weaned to room air.  Much improved -Recs for outpatient sleep study on d/c. Cardiology to arrange   Acute heart failure with preserved EF Cardiology consulted.  Patient started on Lasix IV diuresis for management.  Echocardiogram significant for an LVEF of 60 to 65% with indeterminate diastolic parameters and no regional wall motion abnormality.   -Cardiology recommendations: transitioned to torsemide 40mg  bid; added spironolactone   Permanent atrial fibrillation Intermittent rapid ventricular rate.  Patient is on diltiazem and Pradaxa as an outpatient. -Cardiology recommendations: increased to diltiazem 360 mg daily -Continue Pradaxa -HR remains elevated this AM. Toprol was increased to 50mg  per Cardiology   Primary hypertension Patient is on diltiazem and losartan as an outpatient.  Losartan held secondary to aggressive diuresis. -Continue diltiazem, metoprolol per above   Diabetes mellitus type 2 Poorly controlled with hyperglycemia and hemoglobin A1c of 8.0%.  Patient is managed on glipizide as an outpatient, which were held on admission.  Sliding scale insulin initiated on admission. -Continue sliding scale insulin   Seizure disorder Patient  is on Lamictal and Keppra as an outpatient.-Continue Lamictal and Keppra   Peripheral neuropathy Chronic pain Patient is on gabapentin and Cymbalta as an outpatient. -Continue gabapentin and Cymbalta   Obesity Estimated body mass index is 36.78 kg/m as calculated from the following:   Height as of this encounter: 6\' 5"  (1.956 m).   Weight as of this encounter: 140.7 kg.   History of DVT -Continue Pradaxa   Hyperlipidemia Lipitor and omega-3 fatty acid   S/p ileostomy Noted.   Pressure injury Left buttocks. Present on admission.   Bedbound status Stable. OT consulted.      Subjective: Eager to go home soon  Physical Exam: Vitals:   02/26/23 0459 02/26/23 0535 02/26/23 1330 02/26/23 1404  BP:  114/65 111/78 111/78  Pulse:  80 93 93  Resp:  19 19   Temp:  98.2 F (36.8 C) 97.6 F (36.4 C)   TempSrc:  Oral Oral   SpO2:  96% 92%   Weight: (!) 139.4 kg     Height:       General exam: Conversant, in no acute distress Respiratory system: normal chest rise, clear, no audible wheezing Cardiovascular system: regular rhythm, s1-s2 Gastrointestinal system: Nondistended, nontender, pos BS Central nervous system: No seizures, no tremors Extremities: No cyanosis, no joint deformities Skin: No rashes, no pallor Psychiatry: Affect normal // no auditory hallucinations   Data Reviewed:  Labs reviewed: Na 142, K 4.0, Cr 0.92, WBC 8.4, Hgb 12.3, Plts 320  Family Communication: Pt in room, family at bedside  Disposition: Status is: Inpatient Remains inpatient appropriate because: severity of illness  Planned Discharge Destination: Home    Author: Rickey Barbara, MD 02/26/2023 5:26 PM  For on call review [email or web address].

## 2023-02-26 NOTE — TOC Progression Note (Signed)
Transition of Care [facility]) - Progression Note    Patient Details  Name: [name] MRN: [id number] Date of Birth: 01-Oct-1950  [facility]) CM/SW Contact  Otelia Santee, LCSW Phone Number: 02/26/2023, 11:26 AM  Clinical Narrative:    Pt to return home via [facility] at discharge.  TOC continuing to follow for medical readiness.   Expected Discharge Plan: Home/Self Care Barriers to Discharge: Continued Medical Work up  Expected Discharge Plan and Services In-house Referral: Clinical Social Work Discharge Planning Services: NA Post Acute Care Choice: Durable Medical Equipment Living arrangements for the past 2 months: Single Family Home                                       Social Determinants of Health (SDOH) Interventions SDOH Screenings   Food Insecurity: No Food Insecurity (02/26/2023)  Housing: Low Risk  (02/26/2023)  Transportation Needs: No Transportation Needs (02/26/2023)  Utilities: Not At Risk (02/26/2023)  Tobacco Use: Medium Risk (02/16/2023)    Readmission Risk Interventions    02/19/2023   10:43 AM 01/07/2022   11:09 AM  Readmission Risk Prevention Plan  Post Dischage Appt  Complete  Medication Screening  Complete  Transportation Screening Complete Complete  PCP or Specialist Appt within 5-7 Days Complete   Home Care Screening Complete   Medication Review (RN CM) Complete

## 2023-02-26 NOTE — Progress Notes (Signed)
Rounding Note    Patient Name: [name] Date of Encounter: 02/26/2023  [facility] Cardiologist: Chrystie Nose, MD   Subjective   No chest pain or shortness of breath continues to diurese well.  He put out 2.9 L yesterday and is net -20 L since admission. Inpatient Medications    Scheduled Meds:  atorvastatin  10 mg Oral QHS   dabigatran  150 mg Oral Q12H   diltiazem  360 mg Oral Daily   doxepin  150 mg Oral QHS   DULoxetine  60 mg Oral Daily   fenofibrate  160 mg Oral Daily   fluticasone  1 spray Each Nare Daily   gabapentin  800 mg Oral TID   hydrocerin   Topical BID   insulin aspart  0-20 Units Subcutaneous TID WC   insulin glargine-yfgn  15 Units Subcutaneous Daily   lamoTRIgine  150 mg Oral BID   levETIRAcetam  1,500 mg Oral BID   metoprolol succinate  25 mg Oral Daily   multivitamin with minerals  1 tablet Oral Daily   omega-3 acid ethyl esters  1 g Oral BID   spironolactone  25 mg Oral Daily   torsemide  60 mg Oral BID   Continuous Infusions:  PRN Meds: acetaminophen **OR** acetaminophen, HYDROcodone-acetaminophen, ondansetron **OR** ondansetron (ZOFRAN) IV, mouth rinse   Vital Signs    Vitals:   02/25/23 1245 02/25/23 1916 02/26/23 0459 02/26/23 0535  BP: 115/63 (!) 118/91  114/65  Pulse: (!) 104 65  80  Resp:  18  19  Temp:  98.2 F (36.8 C)  98.2 F (36.8 C)  TempSrc:  Oral  Oral  SpO2: 95% 90%  96%  Weight:   (!) 139.4 kg   Height:        Intake/Output Summary (Last 24 hours) at 02/26/2023 1020 Last data filed at 02/26/2023 0459 Gross per 24 hour  Intake 560 ml  Output 2900 ml  Net -2340 ml      02/26/2023    4:59 AM 02/25/2023    5:00 AM 02/24/2023    4:53 AM  Last 3 Weights  Weight (lbs) 307 lb 5.1 oz 307 lb 5.1 oz 310 lb 3 oz  Weight (kg) 139.4 kg 139.4 kg 140.7 kg      Telemetry    Atrial fibrillation heart rate in the low 100s but gets up to 120s at times- Personally Reviewed  ECG    No new ECG  tracings today. - Personally Reviewed  Physical Exam   GEN: Well nourished, well developed in no acute distress [place] obese HEENT: Normal NECK: No JVD; No carotid bruits LYMPHATICS: No lymphadenopathy CARDIAC: irregularly irregular no murmurs, rubs, gallops RESPIRATORY:  Clear to auscultation without rales, wheezing or rhonchi  ABDOMEN: Soft, non-tender, non-distended MUSCULOSKELETAL:  No edema; No deformity  SKIN: Warm and dry NEUROLOGIC:  Alert and oriented x 3 PSYCHIATRIC:  Normal affect  Labs    High Sensitivity Troponin:  No results for input(s): "TROPONINIHS" in the last 720 hours.   Chemistry Recent Labs  Lab 02/24/23 0524 02/25/23 0510 02/26/23 0625  NA 139 144 142  K 3.4* 4.4 4.0  CL 95* 100 99  CO2 33* 30 31  GLUCOSE 174* 192* 180*  BUN 42* 41* 41*  CREATININE 0.84 0.83 0.92  CALCIUM 9.0 9.5 9.2  MG 2.1  --   --   PROT  --   --  7.8  ALBUMIN  --   --  3.3*  AST  --   --  17  ALT  --   --  15  ALKPHOS  --   --  42  BILITOT  --   --  0.6  GFRNONAA >60 >60 >60  ANIONGAP 11 14 12     Lipids No results for input(s): "CHOL", "TRIG", "HDL", "LABVLDL", "LDLCALC", "CHOLHDL" in the last 168 hours.  Hematology Recent Labs  Lab 02/20/23 0542 02/21/23 0544 02/26/23 0518  WBC 9.9 9.6 8.4  RBC 4.64 4.82 4.56  HGB 12.6* 12.8* 12.3*  HCT 42.7 44.3 42.3  MCV 92.0 91.9 92.8  MCH 27.2 26.6 27.0  MCHC 29.5* 28.9* 29.1*  RDW 16.9* 16.7* 16.1*  PLT 261 302 320   Thyroid No results for input(s): "TSH", "FREET4" in the last 168 hours.  BNPNo results for input(s): "BNP", "PROBNP" in the last 168 hours.  DDimer No results for input(s): "DDIMER" in the last 168 hours.   Radiology    No results found.  Cardiac Studies   Echocardiogram 02/16/2023: 1. Left ventricular ejection fraction, by estimation, is 60 to 65%. The  left ventricle has normal function. The left ventricle has no regional  wall motion abnormalities. Left ventricular diastolic parameters are   indeterminate.   2. Right ventricular systolic function is normal. The right ventricular  size is normal.   3. The mitral valve is normal in structure. No evidence of mitral valve  regurgitation. No evidence of mitral stenosis.   4. The aortic valve is tricuspid. Aortic valve regurgitation is not  visualized. No aortic stenosis is present.  _______________   ABIs 02/16/2023: Summary:  Right: Resting right ankle-brachial index indicates noncompressible right  lower extremity arteries.   Possibly clacified, compression was present but at high pressures.  Left: Resting left ankle-brachial index is within normal range.   Patient Profile     72 y.o. 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 He was noted to be in acute hypoxic and hypercarbic respiratory failure with pCO2 of 73 initially requiring BiPAP. Cardiology was consulted for evaluation of acute CHF at the request of Dr. Robb Matar.   Assessment & Plan    Acute Hypoxic and Hypercarbic Respiratory Failure Patient presented to the ED after sister found him unresponsive.  HE was noted to be severely hypoxic on arrival of EMS with O2 sats as low as the 60s. Venous blood gas in the ED showed pH of 7.31, pCO2 of 73, pO2 56, Bicarb 36.8. BNP mildly elevated at 198.  -Chest x-ray showed cardiopericardial enlargement with hazy appearance of the bilateral chest and interstitial opacities felt to be consistent with CHF.  -He was initially on BiPAP but now is on 4L of O2 via HFNC. -Suspect respiratory failure is multifactorial (CHF, suspected obstructive sleep apnea/ obesity  hypoventilation syndrome, and BMI).  -He would benefit from outpatient sleep study (would do split night sleep study not home sleep study). We can help arrange this as an outpatient.   Acute HFpEF -BNP mildly elevated at 198.  But may be falsely low in the setting of morbid obesity -chest x-ray showed cardiopericardial enlargement with hazy appearance of the bilateral chest and interstitial opacities felt to be  consistent with CHF.  -Echo showed  LVEF of 60-65% with normal wall motion, normal RV size and function, and no significant valvular disease.  -He was aggressively diuresed with IV Lasix for several days and then switched to Torsemide initially 40mg  twice daily but urine output dropped off so it was increased to 60 mg twice daily -Continues to have good urine output on torsemide 40 mg twice daily. -Weight is down 20 pounds from admission -He put out 2.9 L yesterday and is net -20 L since admission -Serum creatinine remained stable at 0.92 with potassium 4 -It is very difficult to assess volume status given his body habitus.   -Continue Torsemide 60 mg twice daily.  -Continue Spironolactone 25mg  daily -Would avoid SGLT2 inhibitors given he is bedbound and prone to UTIs. -Continue to monitor daily weights, strict I/Os, and renal function.   Persistent Atrial Fibrillation Patient has a history of long-standing persistent atrial fibrillation. Rates as high as the 140s on admission. -Currently remains in atrial fibrillation with heart rate 90 to 100 bpm at times gets up in the 120s -Continue Cardizem CD 360 mg daily and Pradaxa 150 mg twice daily -Increase Toprol to 50 mg daily for better heart rate control   Hypertension -BP controlled at 114/65 -Continue Cardizem CD 360 mg daily, Toprol XL 50 mg daily and spironolactone 25 mg daily  Hyperlipidemia - Continue Lipitor 10mg  daily.   PAD ABIs this admission showed non-compressible arteries on the right but were normal on the left. -  Continue Pradaxa and Lipitor as above. - Can be followed up outpatient   Hypokalemia -Potassium 4 today -Mag 2.1 -Continue spironolactone 25 mg daily and  potassium supplementation as needed  Otherwise, per primary team: - Type 2 diabetes mellitus - Severe peripheral neuropathy - S/p colectomy and ileostomy  - Seizure disorder - Multiple DVT - Chronic pain - Sacral decubitus ulcer  For questions or updates, please contact [facility] Please consult [email or web address] for contact info under       Signed, Armanda Magic, MD  02/26/2023, 10:20 AM

## 2023-02-26 NOTE — Plan of Care (Signed)

## 2023-02-26 NOTE — Progress Notes (Signed)
   02/26/23 1955  BiPAP/CPAP/SIPAP  BiPAP/CPAP/SIPAP Pt Type Adult  Reason BIPAP/CPAP not in use Non-compliant (Equipment remains at bedside should he change his mind)

## 2023-02-27 ENCOUNTER — Other Ambulatory Visit: Payer: Self-pay | Admitting: Neurology

## 2023-02-27 DIAGNOSIS — G40A01 Absence epileptic syndrome, not intractable, with status epilepticus: Secondary | ICD-10-CM

## 2023-02-27 DIAGNOSIS — I5031 Acute diastolic (congestive) heart failure: Secondary | ICD-10-CM | POA: Diagnosis not present

## 2023-02-27 DIAGNOSIS — I4891 Unspecified atrial fibrillation: Secondary | ICD-10-CM | POA: Diagnosis not present

## 2023-02-27 DIAGNOSIS — G6289 Other specified polyneuropathies: Secondary | ICD-10-CM

## 2023-02-27 DIAGNOSIS — I10 Essential (primary) hypertension: Secondary | ICD-10-CM | POA: Diagnosis not present

## 2023-02-27 DIAGNOSIS — J9601 Acute respiratory failure with hypoxia: Secondary | ICD-10-CM | POA: Diagnosis not present

## 2023-02-27 DIAGNOSIS — R198 Other specified symptoms and signs involving the digestive system and abdomen: Secondary | ICD-10-CM

## 2023-02-27 DIAGNOSIS — Z7901 Long term (current) use of anticoagulants: Secondary | ICD-10-CM

## 2023-02-27 DIAGNOSIS — Z86718 Personal history of other venous thrombosis and embolism: Secondary | ICD-10-CM

## 2023-02-27 DIAGNOSIS — I509 Heart failure, unspecified: Secondary | ICD-10-CM | POA: Diagnosis not present

## 2023-02-27 DIAGNOSIS — J9602 Acute respiratory failure with hypercapnia: Secondary | ICD-10-CM | POA: Diagnosis not present

## 2023-02-27 LAB — COMPREHENSIVE METABOLIC PANEL
ALT: 15 U/L (ref 0–44)
AST: 17 U/L (ref 15–41)
Albumin: 3.3 g/dL — ABNORMAL LOW (ref 3.5–5.0)
Alkaline Phosphatase: 43 U/L (ref 38–126)
Anion gap: 11 (ref 5–15)
BUN: 41 mg/dL — ABNORMAL HIGH (ref 8–23)
CO2: 31 mmol/L (ref 22–32)
Calcium: 9.1 mg/dL (ref 8.9–10.3)
Chloride: 97 mmol/L — ABNORMAL LOW (ref 98–111)
Creatinine, Ser: 1 mg/dL (ref 0.61–1.24)
GFR, Estimated: >60 mL/min (ref >60)
Glucose, Bld: 186 mg/dL — ABNORMAL HIGH (ref 70–99)
Potassium: 3.6 mmol/L (ref 3.5–5.1)
Sodium: 139 mmol/L (ref 135–145)
Total Bilirubin: 0.4 mg/dL (ref 0.3–1.2)
Total Protein: 7.5 g/dL (ref 6.5–8.1)

## 2023-02-27 LAB — GLUCOSE, CAPILLARY
Glucose-Capillary: 197 mg/dL — ABNORMAL HIGH (ref 70–99)
Glucose-Capillary: 282 mg/dL — ABNORMAL HIGH (ref 70–99)

## 2023-02-27 LAB — CBC
HCT: 44.1 % (ref 39.0–52.0)
Hemoglobin: 12.6 g/dL — ABNORMAL LOW (ref 13.0–17.0)
MCH: 26.5 pg (ref 26.0–34.0)
MCHC: 28.6 g/dL — ABNORMAL LOW (ref 30.0–36.0)
MCV: 92.8 fL (ref 80.0–100.0)
Platelets: 329 10*3/uL (ref 150–400)
RBC: 4.75 MIL/uL (ref 4.22–5.81)
RDW: 16 % — ABNORMAL HIGH (ref 11.5–15.5)
WBC: 8.3 10*3/uL (ref 4.0–10.5)
nRBC: 0 % (ref 0.0–0.2)

## 2023-02-27 MED ORDER — DILTIAZEM HCL ER COATED BEADS 360 MG PO CP24: 360.0000 mg | ORAL_CAPSULE | Freq: Every day | ORAL | 0 refills | Status: DC

## 2023-02-27 MED ORDER — LEVETIRACETAM 750 MG PO TABS: 1500.0000 mg | ORAL_TABLET | Freq: Two times a day (BID) | ORAL | 0 refills | Status: DC

## 2023-02-27 MED ORDER — METOPROLOL SUCCINATE ER 25 MG PO TB24: 12.5000 mg | ORAL_TABLET | Freq: Once | ORAL | Status: DC

## 2023-02-27 MED ORDER — POTASSIUM CHLORIDE CRYS ER 20 MEQ PO TBCR
40.0000 meq | EXTENDED_RELEASE_TABLET | Freq: Once | ORAL | Status: AC
  Administered 2023-02-27: 40 meq via ORAL
  Filled 2023-02-27: qty 2

## 2023-02-27 MED ORDER — SPIRONOLACTONE 25 MG PO TABS: 25.0000 mg | ORAL_TABLET | Freq: Every day | ORAL | 0 refills | Status: DC

## 2023-02-27 MED ORDER — TORSEMIDE 60 MG PO TABS: 60.0000 mg | ORAL_TABLET | Freq: Two times a day (BID) | ORAL | 0 refills | Status: DC

## 2023-02-27 MED ORDER — METOPROLOL SUCCINATE ER 50 MG PO TB24
50.0000 mg | ORAL_TABLET | Freq: Every day | ORAL | 0 refills | Status: DC
Start: 2023-02-27 — End: 2023-03-24

## 2023-02-27 NOTE — Plan of Care (Signed)
  Problem: Fluid Volume: Goal: Ability to maintain a balanced intake and output will improve Outcome: Progressing   Problem: Health Behavior/Discharge Planning: Goal: Ability to identify and utilize available resources and services will improve Outcome: Progressing   Problem: Metabolic: Goal: Ability to maintain appropriate glucose levels will improve Outcome: Progressing   Problem: Nutritional: Goal: Maintenance of adequate nutrition will improve Outcome: Progressing   Problem: Skin Integrity: Goal: Risk for impaired skin integrity will decrease Outcome: Progressing   Problem: Education: Goal: Knowledge of General Education information will improve Description: Including pain rating scale, medication(s)/side effects and non-pharmacologic comfort measures Outcome: Progressing   Problem: Clinical Measurements: Goal: Ability to maintain clinical measurements within normal limits will improve Outcome: Progressing Goal: Will remain free from infection Outcome: Progressing

## 2023-02-27 NOTE — TOC Transition Note (Signed)
Transition of Care [facility]) - CM/SW Discharge Note   Patient Details  Name: LEMICHAEL KONIG MRN: [id number] Date of Birth: 04/17/1951  Transition of Care [facility] CM/SW Contact:  Otelia Santee, LCSW Phone Number: 02/27/2023, 3:09 PM   Clinical Narrative:    Pt to return home at discharge with sister providing caregiving services. Pt to transport home via [facility]. [facility] called at 3:10pm for transportation.      Barriers to Discharge: Barriers Resolved   Patient Goals and CMS Choice CMS Medicare.gov Compare Post Acute Care list provided to::  (NA) Choice offered to / list presented to :  (NA)  Discharge Placement                  Patient to be transferred to facility by: [facility] Name of family member notified: Pt    Discharge Plan and Services Additional resources added to the After Visit Summary for   In-house Referral: Clinical Social Work Discharge Planning Services: NA Post Acute Care Choice: Durable Medical Equipment          DME Arranged: N/A DME Agency: NA                  Social Determinants of Health (SDOH) Interventions SDOH Screenings   Food Insecurity: No Food Insecurity (02/26/2023)  Housing: Low Risk  (02/26/2023)  Transportation Needs: No Transportation Needs (02/26/2023)  Utilities: Not At Risk (02/26/2023)  Tobacco Use: Medium Risk (02/16/2023)     Readmission Risk Interventions    02/19/2023   10:43 AM 01/07/2022   11:09 AM  Readmission Risk Prevention Plan  Post Dischage Appt  Complete  Medication Screening  Complete  Transportation Screening Complete Complete  PCP or Specialist Appt within 5-7 Days Complete   Home Care Screening Complete   Medication Review (RN CM) Complete

## 2023-02-27 NOTE — Discharge Summary (Signed)
Physician Discharge Summary   Patient: [name] MRN: [id number] DOB: October 16, 1950  Admit date:     02/16/2023  Discharge date: 02/27/23  Discharge Physician: Rickey Barbara   PCP: Caffie Damme, MD   Recommendations at discharge:    Follow up with PCP in 1-2 weeks Follow up with Cardiology as scheduled  Discharge Diagnoses: Principal Problem:   Acute respiratory failure with hypoxia and hypercapnia (HCC) Active Problems:   History of DVT (deep vein thrombosis)   Hyperlipidemia   Peripheral neuropathy   Seizures (HCC)   Atrial fibrillation with RVR (HCC)   PFO (patent foramen ovale)   PAF (paroxysmal atrial fibrillation) (HCC)   Obesity   Pressure injury of skin   Type 2 diabetes mellitus (HCC)   Acute diastolic CHF (congestive heart failure) (HCC)   Primary hypertension  Resolved Problems:   * No resolved hospital problems. *  Hospital Course: [name] is a 72 y.o. 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  Patient presented secondary to shortness of breath and was found to have respiratory failure secondary to acute heart failure. Cardiology consulted. IV diuresis started.  Assessment and Plan: Acute respiratory failure with hypoxia and hypercapnia secondary to acute heart failure.  Patient has been weaned to room air.  Much improved -Recs for outpatient sleep study on d/c. Cardiology to arrange   Acute heart failure with preserved EF Cardiology consulted.  Patient initially started on Lasix IV diuresis for management.  Echocardiogram significant for an LVEF of 60 to 65% with indeterminate diastolic parameters and no regional wall motion abnormality.   -Cardiology recommendations: transitioned to torsemide 40mg  bid; added spironolactone   Permanent atrial  fibrillation Intermittent rapid ventricular rate.  Patient is on diltiazem and Pradaxa as an outpatient. -Cardiology recommendations: increased to diltiazem 360 mg daily -Continue Pradaxa -HR remained elevated this AM. Toprol was increased to 50mg  on d/c after discussing with Cardiology. OK to d/c per Cardiology on day of d/c   Primary hypertension Patient is on diltiazem and losartan as an outpatient.  Losartan held secondary to aggressive diuresis. -Continue diltiazem, metoprolol per above   Diabetes mellitus type 2 Poorly controlled with hyperglycemia and hemoglobin A1c of 8.0%.  Patient is managed on glipizide as an outpatient, which were held on admission.  Sliding scale insulin initiated on admission. -Continue sliding scale insulin -cont home meds on d/c   Seizure disorder Patient is on Lamictal and Keppra as an outpatient.-Continue Lamictal and Keppra   Peripheral neuropathy Chronic pain Patient is on gabapentin and Cymbalta as an outpatient. -Continue gabapentin and Cymbalta   Obesity Estimated body mass index is 36.78 kg/m as calculated from the following:   Height as of this encounter: 6\' 5"  (1.956 m).   Weight as of this encounter: 140.7 kg.   History of DVT -Continue Pradaxa   Hyperlipidemia Lipitor and omega-3 fatty acid   S/p ileostomy Noted.   Pressure injury Left buttocks. Present on admission.   Bedbound status Stable.        Consultants: Cardiology Procedures performed:   Disposition: Home Diet recommendation:  Cardiac and Carb modified diet DISCHARGE MEDICATION: Allergies as of 02/27/2023       Reactions   Penicillins Other (See Comments)   Pt has no personal reaction to this med, but his mother/brother do. Has patient had a PCN reaction causing immediate  rash, facial/tongue/throat swelling, SOB or lightheadedness with hypotension: No Has patient had a PCN reaction causing severe rash involving mucus membranes or skin necrosis: No Has  patient had a PCN reaction that required hospitalization No Has patient had a PCN reaction occurring within the last 10 years: No If all of the above answers are "NO", then may proceed with Cephalosporin        Medication List     STOP taking these medications    Dilt-XR 240 MG 24 hr capsule Generic drug: diltiazem   gentamicin ointment 0.1 % Commonly known as: GARAMYCIN   losartan 25 MG tablet Commonly known as: COZAAR   nitrofurantoin 100 MG capsule Commonly known as: MACRODANTIN   Xtampza ER 27 MG C12a Generic drug: oxyCODONE ER       TAKE these medications    acetaminophen 500 MG tablet Commonly known as: TYLENOL Take 500-1,000 mg by mouth every 6 (six) hours as needed for mild pain, moderate pain, fever or headache.   Alpha-Lipoic Acid 600 MG Tabs Take 1 tablet by mouth daily.   atorvastatin 10 MG tablet Commonly known as: LIPITOR Take 10 mg by mouth at bedtime.   Calcium Carbonate-Vitamin D 600-400 MG-UNIT tablet Take 1 tablet by mouth daily.   diltiazem 360 MG 24 hr capsule Commonly known as: CARDIZEM CD Take 1 capsule (360 mg total) by mouth daily. Start taking on: February 28, 2023   doxepin 75 MG capsule Commonly known as: SINEQUAN Take 150 mg by mouth at bedtime.   DULoxetine 60 MG capsule Commonly known as: CYMBALTA Take 60 mg by mouth daily.   fenofibrate 160 MG tablet Take 160 mg by mouth daily.   Fish Oil 1000 MG Caps Take 1,000 mg by mouth 2 (two) times a day.   gabapentin 800 MG tablet Commonly known as: NEURONTIN Take 800 mg by mouth 3 (three) times daily.   glipiZIDE 2.5 MG 24 hr tablet Commonly known as: GLUCOTROL XL Take 2.5 mg by mouth in the morning and at bedtime.   IRON PO Take 1 tablet by mouth daily.   lamoTRIgine 150 MG tablet Commonly known as: LAMICTAL Take 1 tablet (150 mg total) by mouth 2 (two) times daily.   levETIRAcetam 750 MG tablet Commonly known as: KEPPRA Take 2 tablets (1,500 mg total) by mouth 2  (two) times daily. What changed: See the new instructions.   metFORMIN 1000 MG tablet Commonly known as: GLUCOPHAGE Take 1,000 mg by mouth 2 (two) times daily.   metoprolol succinate 50 MG 24 hr tablet Commonly known as: Toprol XL Take 1 tablet (50 mg total) by mouth daily. Take with or immediately following a meal.   multivitamin with minerals Tabs tablet Take 1 tablet by mouth daily.   naloxone 4 MG/0.1ML Liqd nasal spray kit Commonly known as: NARCAN Place 1 spray into the nose once.   OVER THE COUNTER MEDICATION Apply 1 Application topically See admin instructions. Apply Silver Alginate Dressing to Slow healing wound   oxyCODONE-acetaminophen 10-325 MG tablet Commonly known as: PERCOCET Take 1 tablet by mouth.   Pradaxa 150 MG Caps capsule Generic drug: dabigatran TAKE 1 CAPSULE BY MOUTH TWICE A DAY   PROBIOTIC ACIDOPHILUS PO Take 1 tablet by mouth daily.   spironolactone 25 MG tablet Commonly known as: ALDACTONE Take 1 tablet (25 mg total) by mouth daily. Start taking on: February 28, 2023   Torsemide 60 MG Tabs Take 60 mg by mouth 2 (two) times daily.   VITAMIN D3  PO Take 1 capsule by mouth daily.        Follow-up Information     Fenton, Clint R, PA Follow up.   Specialty: Cardiology Why: Afib Clinic follow-up at the [facility] at [facility] on 6th floor - Monday Mar 09, 2023 at 3:30 PM. Arrive 15 minutes prior to appointment to check in. Contact information: [address] [place] [address] [phone]         Corrin Parker, PA-C Follow up.   Specialty: Cardiology Why: [facility] - [facility] location - general cardiology Tuesday Mar 10, 2023 at 8:25 AM. Arrive 15 minutes prior to appointment to check in. Carmie Kanner is one of our PA Contact information: [address] [place] [address] [phone]         Caffie Damme, MD Follow up in 2 week(s).   Specialty: Family Medicine Why: Hospital  follow up Contact information: [address] [place] [address] [phone]                Discharge Exam: Ceasar Mons Weights   02/25/23 0500 02/26/23 0459 02/27/23 0518  Weight: (!) 139.4 kg (!) 139.4 kg (!) 141 kg   General exam: Conversant, in no acute distress Respiratory system: normal chest rise, clear, no audible wheezing Cardiovascular system: regular rhythm, s1-s2 Gastrointestinal system: Nondistended, nontender, pos BS Central nervous system: No seizures, no tremors Extremities: No cyanosis, no joint deformities Skin: No rashes, no pallor Psychiatry: Affect normal // no auditory hallucinations   Condition at discharge: fair  The results of significant diagnostics from this hospitalization (including imaging, microbiology, ancillary and laboratory) are listed below for reference.   Imaging Studies: CT Angio Chest Pulmonary Embolism (PE) W or WO Contrast  Result Date: 02/16/2023 CLINICAL DATA:  Pulmonary embolism (PE) suspected, low to intermediate prob, neg D-dimer Shortness of breath. EXAM: CT ANGIOGRAPHY CHEST WITH CONTRAST TECHNIQUE: Multidetector CT imaging of the chest was performed using the standard protocol during bolus administration of intravenous contrast. Multiplanar CT image reconstructions and MIPs were obtained to evaluate the vascular anatomy. RADIATION DOSE REDUCTION: This exam was performed according to the departmental dose-optimization program which includes automated exposure control, adjustment of the mA and/or kV according to patient size and/or use of iterative reconstruction technique. CONTRAST:  80mL OMNIPAQUE IOHEXOL 350 MG/ML SOLN COMPARISON:  Radiograph earlier today FINDINGS: Cardiovascular: Motion artifact limitations. Evaluation of the pulmonary arteries are diagnostic to the proximal segmental level. Allowing for this limitation there is no central pulmonary embolus. The heart is enlarged. Aortic atherosclerosis and tortuosity. There are  coronary artery calcifications no pericardial effusion. Mediastinum/Nodes: Small mediastinal lymph nodes not enlarged by size criteria. No definite hilar adenopathy. No esophageal wall thickening. No thyroid nodule. Lungs/Pleura: Small to moderate right and diminutive left pleural effusion. There is associated compressive atelectasis. Question of faint ground-glass opacities in the left upper lung zone. Motion artifact limits more detailed assessment. Retained mucus in the trachea. Calcified granuloma in the right lower lobe. Upper Abdomen: Suspected hepatic steatosis. Low-density lesion in the spleen, unchanged from 09/28/2022 abdominal CT. No further follow-up imaging is recommended. Musculoskeletal: Mild compression deformities of T4 and T5. Technically age indeterminate these are likely chronic. No focal bone lesion. No chest wall soft tissue abnormality. Review of the MIP images confirms the above findings. IMPRESSION: 1. Motion artifact limitations. Allowing for this limitation there is no central pulmonary embolus. 2. Small to moderate right and diminutive left pleural effusion with associated compressive atelectasis. Question  of faint ground-glass opacities in the left upper lung zone, may be pulmonary edema, infectious or inflammatory. 3. Cardiomegaly. Coronary artery calcifications. Aortic Atherosclerosis (ICD10-I70.0). Electronically Signed   By: Narda Rutherford M.D.   On: 02/16/2023 17:26   VAS [place] ABI WITH/WO TBI  Result Date: 02/16/2023  LOWER EXTREMITY DOPPLER STUDY Patient Name:  THOM ROSSY  Date of Exam:   02/16/2023 Medical Rec #: [id number]       Accession #:    [id number] Date of Birth: [birth year]       Patient Gender: M Patient Age:   64 years Exam Location:  [facility] Procedure:      VAS [place] ABI WITH/WO TBI Referring Phys: CALLIE GOODRICH --------------------------------------------------------------------------------  Indications: Rest pain. High Risk Factors: Hypertension,  hyperlipidemia, Diabetes.  Limitations: Today's exam was limited due to involuntary patient movement. Comparison Study: No prior study Performing Technologist: Shona Simpson  Examination Guidelines: A complete evaluation includes at minimum, Doppler waveform signals and systolic blood pressure reading at the level of bilateral brachial, anterior tibial, and posterior tibial arteries, when vessel segments are accessible. Bilateral testing is considered an integral part of a complete examination. Photoelectric Plethysmograph (PPG) waveforms and toe systolic pressure readings are included as required and additional duplex testing as needed. Limited examinations for reoccurring indications may be performed as noted.  ABI Findings: +--------+------------------+-----+---------+--------+ Right   Rt Pressure (mmHg)IndexWaveform Comment  +--------+------------------+-----+---------+--------+ [id number]                    triphasic         +--------+------------------+-----+---------+--------+ PTA     207               1.36 biphasic          +--------+------------------+-----+---------+--------+ DP      201               1.32 triphasic         +--------+------------------+-----+---------+--------+ +--------+------------------+-----+---------+-------+ Left    Lt Pressure (mmHg)IndexWaveform Comment +--------+------------------+-----+---------+-------+ [id number]                    triphasic        +--------+------------------+-----+---------+-------+ PTA     178               1.17 biphasic         +--------+------------------+-----+---------+-------+ DP      170               1.12 biphasic         +--------+------------------+-----+---------+-------+ +-------+-----------+-----------+------------+------------+ ABI/TBIToday's ABIToday's TBIPrevious ABIPrevious TBI +-------+-----------+-----------+------------+------------+ Right  1.36                                            +-------+-----------+-----------+------------+------------+ Left   1.17                                           +-------+-----------+-----------+------------+------------+ Unable to assess Great Toe blood flow, due to misshaped toes.  Summary: Right: Resting right ankle-brachial index indicates noncompressible right lower extremity arteries. Possibly clacified, compression was present but at high pressures. Left: Resting left ankle-brachial index is within normal range. *See table(s) above for measurements and observations.  Electronically signed by Gerarda Fraction on 02/16/2023 at 4:49:47 PM.  Final    ECHOCARDIOGRAM COMPLETE  Result Date: 02/16/2023    ECHOCARDIOGRAM REPORT   Patient Name:   CHRISTOPER NOGUERAS Date of Exam: 02/16/2023 Medical Rec #:  [id number]      Height:       77.0 in Accession #:    [id number]     Weight:       325.0 lb Date of Birth:  03/12/1951      BSA:          2.749 m Patient Age:    72 years       BP:           129/69 mmHg Patient Gender: M              HR:           125 bpm. Exam Location:  Inpatient Procedure: 3D Echo, Cardiac Doppler and Color Doppler Indications:    CHF I50.9  History:        Patient has prior history of Echocardiogram examinations, most                 recent 05/23/2014. Arrythmias:Atrial Flutter,                 Signs/Symptoms:Dyspnea; Risk Factors:Dyslipidemia, Former Smoker                 and Hypertension. 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  Sonographer:    Dondra Prader RVT RCS Referring Phys: [phone] DAVID MANUEL ORTIZ  Sonographer Comments: HR 105-144 bpm Due to very very poor and limited windows, Definity deemed inappropriate to use at this time. Unable to obtain all picutures and measurements due to A-fib w/ rvr. Patient unable to follow directions IMPRESSIONS  1. Left ventricular ejection fraction, by estimation, is 60 to 65%. The left ventricle has normal function. The left ventricle has no regional wall  motion abnormalities. Left ventricular diastolic parameters are indeterminate.  2. Right ventricular systolic function is normal. The right ventricular size is normal.  3. The mitral valve is normal in structure. No evidence of mitral valve regurgitation. No evidence of mitral stenosis.  4. The aortic valve is tricuspid. Aortic valve regurgitation is not visualized. No aortic stenosis is present. FINDINGS  Left Ventricle: Left ventricular ejection fraction, by estimation, is 60 to 65%. The left ventricle has normal function. The left ventricle has no regional wall motion abnormalities. The left ventricular internal cavity size was normal in size. There is  no left ventricular hypertrophy. Left ventricular diastolic function could not be evaluated due to atrial fibrillation. Left ventricular diastolic parameters are indeterminate. Right Ventricle: The right ventricular size is normal. No increase in right ventricular wall thickness. Right ventricular systolic function is normal. Left Atrium: Left atrial size was normal in size. Right Atrium: Right atrial size was normal in size. Pericardium: There is no evidence of pericardial effusion. Mitral Valve: The mitral valve is normal in structure. No evidence of mitral valve regurgitation. No evidence of mitral valve stenosis. Tricuspid Valve: The tricuspid valve is normal in structure. Tricuspid valve regurgitation is trivial. No evidence of tricuspid stenosis. Aortic Valve: The aortic valve is tricuspid. Aortic valve regurgitation is not visualized. No aortic stenosis is present. Pulmonic Valve: The pulmonic valve was normal in structure. Pulmonic valve regurgitation is not visualized. No evidence of pulmonic stenosis. Aorta: The aortic root is normal in size  and structure. IAS/Shunts: No atrial level shunt detected by color flow Doppler.  LEFT VENTRICLE PLAX 2D LVOT diam:     2.20 cm LVOT Area:     3.80 cm  RIGHT VENTRICLE TAPSE (M-mode): 1.8 cm                        PULMONIC VALVE AORTA                 PV Vmax:       0.87 m/s Ao Root diam: 3.50 cm PV Peak grad:  3.0 mmHg  TRICUSPID VALVE TR Peak grad:   12.2 mmHg TR Vmax:        175.00 cm/s  SHUNTS Systemic Diam: 2.20 cm Chilton Si MD Electronically signed by Chilton Si MD Signature Date/Time: 02/16/2023/2:37:39 PM    Final    DG Chest Port 1 View  Result Date: 02/16/2023 CLINICAL DATA:  Questionable sepsis EXAM: PORTABLE CHEST 1 VIEW COMPARISON:  09/28/2022 FINDINGS: Hazy appearance of the bilateral chest, although somewhat greater on the right, with interstitial opacity. Fissural fluid is likely on the right. Cardiopericardial enlargement. Artifact from EKG leads. IMPRESSION: CHF pattern. Electronically Signed   By: Tiburcio Pea M.D.   On: 02/16/2023 04:23    Microbiology: Results for orders placed or performed during the hospital encounter of 02/16/23  Blood Culture (routine x 2)     Status: None   Collection Time: 02/16/23  1:45 AM   Specimen: BLOOD RIGHT HAND  Result Value Ref Range Status   Specimen Description BLOOD RIGHT HAND  Final   Special Requests   Final    BOTTLES DRAWN AEROBIC AND ANAEROBIC Blood Culture adequate volume   Culture   Final    NO GROWTH 5 DAYS Performed at [facility], [address], [place], [place] [address]    Report Status 02/21/2023 FINAL  Final  Blood Culture (routine x 2)     Status: None   Collection Time: 02/16/23  2:04 AM   Specimen: BLOOD  Result Value Ref Range Status   Specimen Description BLOOD SITE NOT SPECIFIED  Final   Special Requests   Final    BOTTLES DRAWN AEROBIC AND ANAEROBIC Blood Culture results may not be optimal due to an excessive volume of blood received in culture bottles   Culture   Final    NO GROWTH 5 DAYS Performed at [facility], [address] [place], [place] [address]    Report Status 02/21/2023 FINAL  Final  Urine Culture     Status: Abnormal   Collection Time: 02/16/23  8:27 AM   Specimen: Urine,  Random  Result Value Ref Range Status   Specimen Description   Final    URINE, RANDOM Performed at [facility], [address] [place], [place] [address]    Special Requests   Final    NONE Reflexed from [id number] Performed at [facility], [address], [place], [place] [address]    Culture MULTIPLE SPECIES PRESENT, SUGGEST RECOLLECTION (A)  Final   Report Status 02/17/2023 FINAL  Final  MRSA Next Gen by PCR, Nasal     Status: None   Collection Time: 02/16/23 12:05 PM   Specimen: Nasal Mucosa; Nasal Swab  Result Value Ref Range Status   MRSA by PCR Next Gen NOT DETECTED NOT DETECTED Final    Comment: (NOTE) The GeneXpert MRSA Assay (FDA approved for NASAL specimens only), is  one component of a comprehensive MRSA colonization surveillance program. It is not intended to diagnose MRSA infection nor to guide or monitor treatment for MRSA infections. Test performance is not FDA approved in patients less than 6 years old. Performed at [facility], [address], [place], [place] [address]     Labs: CBC: Recent Labs  Lab 02/21/23 0544 02/26/23 0518 02/27/23 0617  WBC 9.6 8.4 8.3  HGB 12.8* 12.3* 12.6*  HCT 44.3 42.3 44.1  MCV 91.9 92.8 92.8  PLT 302 320 329   Basic Metabolic Panel: Recent Labs  Lab 02/23/23 0520 02/24/23 0524 02/25/23 0510 02/26/23 0625 02/27/23 0617  NA 140 139 144 142 139  K 3.2* 3.4* 4.4 4.0 3.6  CL 96* 95* 100 99 97*  CO2 33* 33* 30 31 31   GLUCOSE 186* 174* 192* 180* 186*  BUN 44* 42* 41* 41* 41*  CREATININE 0.85 0.84 0.83 0.92 1.00  CALCIUM 9.1 9.0 9.5 9.2 9.1  MG  --  2.1  --   --   --    Liver Function Tests: Recent Labs  Lab 02/26/23 0625 02/27/23 0617  AST 17 17  ALT 15 15  ALKPHOS 42 43  BILITOT 0.6 0.4  PROT 7.8 7.5  ALBUMIN 3.3* 3.3*   CBG: Recent Labs  Lab 02/26/23 1148 02/26/23 1640 02/26/23 2257 02/27/23 0728 02/27/23 1131  GLUCAP 202* 196* 226* 197*  282*    Discharge time spent: less than 30 minutes.  Signed: Rickey Barbara, MD [facility] 02/27/2023

## 2023-02-27 NOTE — Progress Notes (Signed)
Dr. Rhona Leavens discussed HR control with Dr. Eden Emms who recommended titrate Toprol to 50mg  daily. I have arranged afib clinic 10/28 (they will get BMET, Mg at that visit) and f/u with gen cards APP on 10/29 in accordance with availablity/Dr. Turner's follow-up recommendations, appts outlined on AVS.

## 2023-02-27 NOTE — Plan of Care (Signed)
  Problem: Education: Goal: Ability to describe self-care measures that may prevent or decrease complications (Diabetes Survival Skills Education) will improve Outcome: Progressing Goal: Individualized Educational Video(s) Outcome: Progressing   Problem: Coping: Goal: Ability to adjust to condition or change in health will improve Outcome: Progressing   Problem: Fluid Volume: Goal: Ability to maintain a balanced intake and output will improve Outcome: Progressing   Problem: Health Behavior/Discharge Planning: Goal: Ability to identify and utilize available resources and services will improve Outcome: Progressing Goal: Ability to manage health-related needs will improve Outcome: Progressing   Problem: Metabolic: Goal: Ability to maintain appropriate glucose levels will improve Outcome: Progressing   Problem: Nutritional: Goal: Maintenance of adequate nutrition will improve Outcome: Progressing Goal: Progress toward achieving an optimal weight will improve Outcome: Progressing   Problem: Skin Integrity: Goal: Risk for impaired skin integrity will decrease Outcome: Progressing   Problem: Tissue Perfusion: Goal: Adequacy of tissue perfusion will improve Outcome: Progressing   Problem: Education: Goal: Knowledge of General Education information will improve Description: Including pain rating scale, medication(s)/side effects and non-pharmacologic comfort measures Outcome: Progressing   Problem: Health Behavior/Discharge Planning: Goal: Ability to manage health-related needs will improve Outcome: Progressing   Problem: Clinical Measurements: Goal: Ability to maintain clinical measurements within normal limits will improve Outcome: Progressing Goal: Will remain free from infection Outcome: Progressing Goal: Diagnostic test results will improve Outcome: Progressing Goal: Respiratory complications will improve Outcome: Progressing Goal: Cardiovascular complication will  be avoided Outcome: Progressing   Problem: Activity: Goal: Risk for activity intolerance will decrease Outcome: Progressing   Problem: Nutrition: Goal: Adequate nutrition will be maintained Outcome: Progressing   Problem: Coping: Goal: Level of anxiety will decrease Outcome: Progressing   Problem: Elimination: Goal: Will not experience complications related to bowel motility Outcome: Progressing Goal: Will not experience complications related to urinary retention Outcome: Progressing   Problem: Pain Managment: Goal: General experience of comfort will improve Outcome: Progressing   Problem: Safety: Goal: Ability to remain free from injury will improve Outcome: Progressing   Problem: Skin Integrity: Goal: Risk for impaired skin integrity will decrease Outcome: Progressing   Problem: Education: Goal: Understanding of post-operative needs will improve Outcome: Progressing Goal: Individualized Educational Video(s) Outcome: Progressing   Problem: Clinical Measurements: Goal: Postoperative complications will be avoided or minimized Outcome: Progressing   Problem: Respiratory: Goal: Will regain and/or maintain adequate ventilation Outcome: Progressing

## 2023-02-27 NOTE — Progress Notes (Signed)
Rounding Note    Patient Name: [name] Date of Encounter: 02/27/2023  [facility] Cardiologist: Chrystie Nose, MD   Subjective  HR has been controlled in the 90's for 24 hours now  Inpatient Medications    Scheduled Meds:  atorvastatin  10 mg Oral QHS   dabigatran  150 mg Oral Q12H   diltiazem  360 mg Oral Daily   doxepin  150 mg Oral QHS   DULoxetine  60 mg Oral Daily   fenofibrate  160 mg Oral Daily   fluticasone  1 spray Each Nare Daily   gabapentin  800 mg Oral TID   hydrocerin   Topical BID   insulin aspart  0-20 Units Subcutaneous TID WC   insulin glargine-yfgn  15 Units Subcutaneous Daily   lamoTRIgine  150 mg Oral BID   levETIRAcetam  1,500 mg Oral BID   metoprolol succinate  37.5 mg Oral Daily   multivitamin with minerals  1 tablet Oral Daily   omega-3 acid ethyl esters  1 g Oral BID   spironolactone  25 mg Oral Daily   torsemide  60 mg Oral BID   Continuous Infusions:  PRN Meds: acetaminophen **OR** acetaminophen, HYDROcodone-acetaminophen, ondansetron **OR** ondansetron (ZOFRAN) IV, mouth rinse   Vital Signs    Vitals:   02/26/23 1404 02/26/23 1945 02/27/23 0518 02/27/23 0518  BP: 111/78 125/69  110/73  Pulse: 93 97  84  Resp:  18  16  Temp:  98.3 F (36.8 C)  97.6 F (36.4 C)  TempSrc:  Oral    SpO2:  95%  93%  Weight:   (!) 141 kg   Height:        Intake/Output Summary (Last 24 hours) at 02/27/2023 0648 Last data filed at 02/27/2023 0100 Gross per 24 hour  Intake 376 ml  Output 1500 ml  Net -1124 ml      02/27/2023    5:18 AM 02/26/2023    4:59 AM 02/25/2023    5:00 AM  Last 3 Weights  Weight (lbs) 310 lb 13.6 oz 307 lb 5.1 oz 307 lb 5.1 oz  Weight (kg) 141 kg 139.4 kg 139.4 kg      Telemetry    Atrial fibrillation with CVR- Personally Reviewed  ECG    No new ECG tracings today. - Personally Reviewed  Physical Exam   GEN: Well nourished, well developed in no acute distress morbidly obese HEENT:  Normal NECK: No JVD; No carotid bruits LYMPHATICS: No lymphadenopathy CARDIAC:irrgularly irregular, no murmurs, rubs, gallops RESPIRATORY:  Clear to auscultation without rales, wheezing or rhonchi  ABDOMEN: Soft, non-tender, non-distended MUSCULOSKELETAL:  No edema; No deformity  SKIN: Warm and dry NEUROLOGIC:  Alert and oriented x 3 PSYCHIATRIC:  Normal affect  Labs    High Sensitivity Troponin:  No results for input(s): "TROPONINIHS" in the last 720 hours.   Chemistry Recent Labs  Lab 02/24/23 0524 02/25/23 0510 02/26/23 0625  NA 139 144 142  K 3.4* 4.4 4.0  CL 95* 100 99  CO2 33* 30 31  GLUCOSE 174* 192* 180*  BUN 42* 41* 41*  CREATININE 0.84 0.83 0.92  CALCIUM 9.0 9.5 9.2  MG 2.1  --   --   PROT  --   --  7.8  ALBUMIN  --   --  3.3*  AST  --   --  17  ALT  --   --  15  ALKPHOS  --   --  42  BILITOT  --   --  0.6  GFRNONAA >60 >60 >60  ANIONGAP 11 14 12     Lipids No results for input(s): "CHOL", "TRIG", "HDL", "LABVLDL", "LDLCALC", "CHOLHDL" in the last 168 hours.  Hematology Recent Labs  Lab 02/21/23 0544 02/26/23 0518  WBC 9.6 8.4  RBC 4.82 4.56  HGB 12.8* 12.3*  HCT 44.3 42.3  MCV 91.9 92.8  MCH 26.6 27.0  MCHC 28.9* 29.1*  RDW 16.7* 16.1*  PLT 302 320   Thyroid No results for input(s): "TSH", "FREET4" in the last 168 hours.  BNPNo results for input(s): "BNP", "PROBNP" in the last 168 hours.  DDimer No results for input(s): "DDIMER" in the last 168 hours.   Radiology    No results found.  Cardiac Studies   Echocardiogram 02/16/2023: 1. Left ventricular ejection fraction, by estimation, is 60 to 65%. The  left ventricle has normal function. The left ventricle has no regional  wall motion abnormalities. Left ventricular diastolic parameters are  indeterminate.   2. Right ventricular systolic function is normal. The right ventricular  size is normal.   3. The mitral valve is normal in structure. No evidence of mitral valve  regurgitation. No  evidence of mitral stenosis.   4. The aortic valve is tricuspid. Aortic valve regurgitation is not  visualized. No aortic stenosis is present.  _______________   ABIs 02/16/2023: Summary:  Right: Resting right ankle-brachial index indicates noncompressible right  lower extremity arteries.   Possibly clacified, compression was present but at high pressures.  Left: Resting left ankle-brachial index is within normal range.   Patient Profile     72 y.o. 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 He was noted to be in acute hypoxic and hypercarbic respiratory failure with pCO2 of 73 initially requiring BiPAP. Cardiology was consulted for evaluation of acute CHF at the request of Dr. Robb Matar.   Assessment & Plan    Acute Hypoxic and Hypercarbic Respiratory Failure Patient presented to the ED after sister found him unresponsive.  HE was noted to be severely hypoxic on arrival of EMS with O2 sats as low as the 60s. Venous blood gas in the ED showed pH of 7.31, pCO2 of 73, pO2 56, Bicarb 36.8. BNP mildly elevated at 198.  -Chest x-ray showed cardiopericardial enlargement with hazy appearance of the bilateral chest and interstitial opacities felt to be consistent with CHF.  -He was initially on BiPAP but now is on 2L [place] -Suspect respiratory failure is multifactorial (CHF, suspected obstructive sleep apnea/ obesity hypoventilation syndrome, and BMI).  -He would benefit from outpatient sleep study (would do split night sleep study not home sleep study). We can help arrange this as an outpatient.   Acute HFpEF -BNP mildly elevated at  198.  But may be falsely low in the setting of morbid obesity -chest x-ray showed cardiopericardial enlargement with hazy appearance of the bilateral chest and interstitial opacities felt to be consistent with CHF.  -Echo showed  LVEF of 60-65% with normal wall motion, normal RV size and function, and no significant valvular disease.  -He was aggressively diuresed with IV Lasix for several days and then switched to Torsemide initially 40mg  twice  daily but urine output dropped off so it was increased to 60 mg twice daily -Continues to have good urine output on torsemide 40 mg twice daily. -Weight up 3 lbs from yesterday but 17 pounds from admission -He put out 1.5 L yesterday and is net -21 L since admission -BMET pending this am but has been stable with diuresis at 0.92 yesterday -It is very difficult to assess volume status given his body habitus.   -continue Torsemide 60mg  BID, spiro 25mg  daily -Would avoid SGLT2 inhibitors given he is bedbound and prone to UTIs.   Persistent Atrial Fibrillation Patient has a history of long-standing persistent atrial fibrillation. Rates as high as the 140s on admission. -Currently remains in atrial fibrillation with heart rate in the 90's for the past 24 hours after increasing Toprol -continue Cardizem CD 360mg  daily, Toprol XL 37.5mg  daily and Pradaxa 150mg  BID   Hypertension -BP controlled at 110/29mmHg -Continue Cardizem CD 360mg  daily, Toprol XL 37.5mg  daily and Spiro 25mg  daily  Hyperlipidemia - Continue Lipitor 10mg  daily.   PAD ABIs this admission showed non-compressible arteries on the right but were normal on the left. - Continue Pradaxa and Lipitor as above. - Can be followed up outpatient   Hypokalemia -Potassium 4 yesterday -Mag 2.1 yesterday -Continue spironolactone 25 mg daily and  potassium supplementation as needed  Otherwise, per primary team: - Type 2 diabetes mellitus - Severe peripheral neuropathy - S/p colectomy and  ileostomy  - Seizure disorder - Multiple DVT - Chronic pain - Sacral decubitus ulcer  [facility] will sign off.   Medication Recommendations:  Atorvastatin 10mg  daily, Pradaxa 150mg  BID, Cardizem CD 360mg  daily, fenofibrate 150mg  daily, Toprol XL 37.5mg  daily and spiro 25mg  daily Other recommendations (labs, testing, etc):  BMET, Mag in 1 week Follow up as an outpatient:  afib clinic 1 week and Dr. Rennis Golden or extender 2 weeks  For questions or updates, please contact [facility] Please consult [email or web address] for contact info under       Signed, Armanda Magic, MD  02/27/2023, 6:48 AM

## 2023-03-02 NOTE — Plan of Care (Signed)
[facility] Tonsillectomy/Adenoidectomy, Postoperative PEDS care plan entered in error.

## 2023-03-05 ENCOUNTER — Ambulatory Visit (HOSPITAL_BASED_OUTPATIENT_CLINIC_OR_DEPARTMENT_OTHER): Payer: Medicare Other | Admitting: General Surgery

## 2023-03-09 ENCOUNTER — Ambulatory Visit (HOSPITAL_COMMUNITY): Payer: Medicare Other | Admitting: Physician Assistant

## 2023-03-10 ENCOUNTER — Ambulatory Visit: Payer: Medicare Other | Admitting: Student

## 2023-03-11 ENCOUNTER — Encounter (HOSPITAL_COMMUNITY): Payer: Self-pay | Admitting: Physician Assistant

## 2023-03-11 ENCOUNTER — Ambulatory Visit (HOSPITAL_COMMUNITY)
Admission: RE | Admit: 2023-03-11 | Discharge: 2023-03-11 | Disposition: A | Discharge status: Home / Self Care | Payer: Medicare Other | Source: Ambulatory Visit | Attending: Physician Assistant | Admitting: Physician Assistant

## 2023-03-11 VITALS — BP 108/82 | HR 85 | Ht 77.0 in | Wt 309.0 lb

## 2023-03-11 DIAGNOSIS — D6869 Other thrombophilia: Secondary | ICD-10-CM | POA: Diagnosis not present

## 2023-03-11 DIAGNOSIS — Z6836 Body mass index (BMI) 36.0-36.9, adult: Secondary | ICD-10-CM | POA: Insufficient documentation

## 2023-03-11 DIAGNOSIS — I878 Other specified disorders of veins: Secondary | ICD-10-CM | POA: Insufficient documentation

## 2023-03-11 DIAGNOSIS — Z7984 Long term (current) use of oral hypoglycemic drugs: Secondary | ICD-10-CM | POA: Insufficient documentation

## 2023-03-11 DIAGNOSIS — Z9049 Acquired absence of other specified parts of digestive tract: Secondary | ICD-10-CM | POA: Diagnosis not present

## 2023-03-11 DIAGNOSIS — I4811 Longstanding persistent atrial fibrillation: Secondary | ICD-10-CM | POA: Diagnosis not present

## 2023-03-11 DIAGNOSIS — Z86718 Personal history of other venous thrombosis and embolism: Secondary | ICD-10-CM | POA: Diagnosis not present

## 2023-03-11 DIAGNOSIS — Z8709 Personal history of other diseases of the respiratory system: Secondary | ICD-10-CM | POA: Diagnosis not present

## 2023-03-11 DIAGNOSIS — E669 Obesity, unspecified: Secondary | ICD-10-CM | POA: Insufficient documentation

## 2023-03-11 DIAGNOSIS — E119 Type 2 diabetes mellitus without complications: Secondary | ICD-10-CM | POA: Insufficient documentation

## 2023-03-11 DIAGNOSIS — I10 Essential (primary) hypertension: Secondary | ICD-10-CM | POA: Diagnosis not present

## 2023-03-11 DIAGNOSIS — E785 Hyperlipidemia, unspecified: Secondary | ICD-10-CM | POA: Diagnosis not present

## 2023-03-11 DIAGNOSIS — Z7902 Long term (current) use of antithrombotics/antiplatelets: Secondary | ICD-10-CM | POA: Insufficient documentation

## 2023-03-11 DIAGNOSIS — Z79899 Other long term (current) drug therapy: Secondary | ICD-10-CM | POA: Insufficient documentation

## 2023-03-11 LAB — BASIC METABOLIC PANEL
Anion gap: 15 (ref 5–15)
BUN: 22 mg/dL (ref 8–23)
CO2: 27 mmol/L (ref 22–32)
Calcium: 9.1 mg/dL (ref 8.9–10.3)
Chloride: 94 mmol/L — ABNORMAL LOW (ref 98–111)
Creatinine, Ser: 0.87 mg/dL (ref 0.61–1.24)
GFR, Estimated: >60 mL/min (ref >60)
Glucose, Bld: 199 mg/dL — ABNORMAL HIGH (ref 70–99)
Potassium: 4.3 mmol/L (ref 3.5–5.1)
Sodium: 136 mmol/L (ref 135–145)

## 2023-03-11 LAB — MAGNESIUM: Magnesium: 2.1 mg/dL (ref 1.7–2.4)

## 2023-03-11 NOTE — Progress Notes (Signed)
Primary Care Physician: Caffie Damme, MD Primary Cardiologist: Chrystie Nose, MD Electrophysiologist: None  Referring Physician: Dr Lysle Morales is a 72 y.o. 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 Patient was admitted on 02/16/2023 after being found unresponsive by his sister. He was noted to be in acute hypoxic and hypercarbic respiratory failure with pCO2 of 73 initially requiring BiPAP. Suspect respiratory failure is multifactorial (CHF, suspected obstructive sleep apnea/ obesity hypoventilation syndrome, and BMI). He was diuresed and his Toprol was increased for rate control. Patient is on Pradaxa for a CHADS2VASC score of 5.  On follow up today, patient reports that he feels well from a cardiac standpoint. He states that he is breathing better since discharge. No bleeding issues on anticoagulation. He remains in rate controlled afib.   Today, he denies symptoms of palpitations, chest pain, shortness of breath, orthopnea, PND, dizziness, presyncope, syncope, snoring, daytime somnolence, bleeding, or neurologic sequela. The patient is tolerating medications without difficulties and is otherwise without complaint today.    Atrial Fibrillation Risk Factors:  he does have symptoms or diagnosis of sleep apnea. he does not have a history of rheumatic fever.   Atrial Fibrillation Management history:  Previous antiarrhythmic drugs: none Previous cardioversions: 2016 Previous ablations: none Anticoagulation history: warfarin, Pradaxa  ROS- All systems are reviewed and negative except as per the HPI above.  Past Medical History:  Diagnosis Date   Arthritis    Atrial fibrillation (HCC)    a. Dx 05/2014 in setting of seizure, PNA. S/p TEE/DCCV.   Bowel  perforation (HCC)    a. transvere and R colectomy and ileostomy in 12/25/13.   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   Psoriasis    Seizures (HCC)    Venous stasis    Weakness of both legs     Current Outpatient Medications  Medication Sig Dispense Refill   acetaminophen (TYLENOL) 500 MG tablet Take 500-1,000 mg by mouth every 6 (six) hours as needed for mild pain, moderate pain, fever or headache.     Alpha-Lipoic Acid 600 MG TABS Take 1 tablet by mouth daily.     atorvastatin (LIPITOR) 10 MG tablet Take 10 mg by mouth at bedtime.     Calcium Carbonate-Vitamin D 600-400 MG-UNIT tablet Take 1 tablet by mouth daily.     Cholecalciferol (VITAMIN D3 PO) Take 1 capsule by mouth daily.     diltiazem (CARDIZEM CD) 360 MG 24 hr capsule Take 1 capsule (360 mg total) by mouth daily. 30 capsule 0   doxepin (SINEQUAN) 75 MG capsule Take 150 mg by mouth at bedtime.   5   DULoxetine (CYMBALTA) 60 MG capsule Take 60 mg by mouth daily.     fenofibrate 160 MG tablet Take 160 mg by mouth daily.  Ferrous Sulfate (IRON PO) Take 1 tablet by mouth daily.     gabapentin (NEURONTIN) 800 MG tablet Take 800 mg by mouth 3 (three) times daily.     glipiZIDE (GLUCOTROL XL) 2.5 MG 24 hr tablet Take 2.5 mg by mouth in the morning and at bedtime.   0   Lactobacillus (PROBIOTIC ACIDOPHILUS PO) Take 1 tablet by mouth daily.     lamoTRIgine (LAMICTAL) 150 MG tablet Take 1 tablet (150 mg total) by mouth 2 (two) times daily. 180 tablet 3   levETIRAcetam (KEPPRA) 750 MG tablet Take 2 tablets (1,500 mg total) by mouth 2 (two)  times daily. 120 tablet 0   metFORMIN (GLUCOPHAGE) 1000 MG tablet Take 1,000 mg by mouth 2 (two) times daily.     metoprolol succinate (TOPROL XL) 50 MG 24 hr tablet Take 1 tablet (50 mg total) by mouth daily. Take with or immediately following a meal. 30 tablet 0   Multiple Vitamin (MULTIVITAMIN WITH MINERALS) TABS Take 1 tablet by mouth daily.     naloxone (NARCAN) nasal spray 4 mg/0.1 mL Place 1 spray into the nose once.     Omega-3 Fatty Acids (FISH OIL) 1000 MG CAPS Take 1,000 mg by mouth 2 (two) times a day.     ONETOUCH VERIO test strip SMARTSIG:1 Strip(s) Via Meter PRN     OVER THE COUNTER MEDICATION Apply 1 Application topically See admin instructions. Apply Silver Alginate Dressing to Slow healing wound     oxyCODONE-acetaminophen (PERCOCET) 10-325 MG tablet Take 1 tablet by mouth.     PRADAXA 150 MG CAPS capsule TAKE 1 CAPSULE BY MOUTH TWICE A DAY 180 capsule 3   spironolactone (ALDACTONE) 25 MG tablet Take 1 tablet (25 mg total) by mouth daily. 30 tablet 0   torsemide 60 MG TABS Take 60 mg by mouth 2 (two) times daily. 30 tablet 0   No current facility-administered medications for this encounter.    Physical Exam: BP 108/82   Pulse 85   Ht 6\' 5"  (1.956 m)   Wt (!) 140.2 kg   BMI 36.64 kg/m   GEN: Well nourished, well developed in no acute distress NECK: No JVD; No carotid bruits CARDIAC: Irregularly irregular rate and rhythm, no murmurs, rubs, gallops RESPIRATORY:  Clear to auscultation without rales, wheezing or rhonchi  ABDOMEN: Soft, non-tender, non-distended EXTREMITIES:  chronic venous stasis; No deformity   Wt Readings from Last 3 Encounters:  03/11/23 (!) 140.2 kg  02/27/23 (!) 141 kg  09/28/22 (!) 144.2 kg     EKG today demonstrates  Afib, LAFB Vent. rate 85 BPM PR interval * ms QRS duration 92 ms QT/QTcB 374/445 ms  Echo 02/16/23 demonstrated   1. Left ventricular ejection fraction, by estimation, is 60 to 65%. The  left ventricle has normal function.  The left ventricle has no regional  wall motion abnormalities. Left ventricular diastolic parameters are  indeterminate.   2. Right ventricular systolic function is normal. The right ventricular  size is normal.   3. The mitral valve is normal in structure. No evidence of mitral valve  regurgitation. No evidence of mitral stenosis.   4. The aortic valve is tricuspid. Aortic valve regurgitation is not  visualized. No aortic stenosis is present.    CHA2DS2-VASc Score = 5  The patient's score is based upon: CHF History: 1 HTN History: 1 Diabetes History: 1 Stroke History: 0 Vascular Disease History: 1 (aortic atherosclerosis noted on CT) Age Score: 1 Gender Score: 0  ASSESSMENT AND PLAN: Longstanding Persistent Atrial Fibrillation (ICD10:  I48.11) The patient's CHA2DS2-VASc score is 5, indicating a 7.2% annual risk of stroke.   Last ECG in SR 07/2017, likely permanent at this point. Not a candidate for ablation.  He is rate controlled today on the increased CCB and BB. Continue diltiazem 360 mg daily Continue Toprol 50 mg daily Continue Pradaxa 150 mg BID  Secondary Hypercoagulable State (ICD10:  D68.69) The patient is at significant risk for stroke/thromboembolism based upon his CHA2DS2-VASc Score of 5.  Continue Dabigatran (Pradaxa).   HTN Stable on current regimen  Obesity Body mass index is 36.64 kg/m.  Encouraged lifestyle modification    Follow up with Marjie Skiff and [name] as scheduled. AF clinic as needed.      Jorja Loa PA-C Afib Clinic [facility] [address] [place], [place] [address] [phone]

## 2023-03-12 ENCOUNTER — Encounter (HOSPITAL_BASED_OUTPATIENT_CLINIC_OR_DEPARTMENT_OTHER): Payer: Medicare Other | Attending: General Surgery | Admitting: General Surgery

## 2023-03-12 DIAGNOSIS — Z87891 Personal history of nicotine dependence: Secondary | ICD-10-CM | POA: Insufficient documentation

## 2023-03-12 DIAGNOSIS — L89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, stage 3: Secondary | ICD-10-CM | POA: Insufficient documentation

## 2023-03-12 DIAGNOSIS — Z6837 Body mass index (BMI) 37.0-37.9, adult: Secondary | ICD-10-CM | POA: Diagnosis not present

## 2023-03-12 DIAGNOSIS — I509 Heart failure, unspecified: Secondary | ICD-10-CM | POA: Diagnosis not present

## 2023-03-12 DIAGNOSIS — I110 Hypertensive heart disease with heart failure: Secondary | ICD-10-CM | POA: Insufficient documentation

## 2023-03-12 DIAGNOSIS — Z932 Ileostomy status: Secondary | ICD-10-CM | POA: Diagnosis not present

## 2023-03-12 DIAGNOSIS — E11622 Type 2 diabetes mellitus with other skin ulcer: Secondary | ICD-10-CM | POA: Diagnosis not present

## 2023-03-12 DIAGNOSIS — G6289 Other specified polyneuropathies: Secondary | ICD-10-CM | POA: Insufficient documentation

## 2023-03-12 NOTE — Progress Notes (Signed)
Cardiology Office Note:    Date:  03/24/2023   ID:  [name], DOB 1951-01-20, MRN [id number]  PCP:  Bruce Damme, MD  Cardiologist:  Bruce Nose, MD     Referring MD: Bruce Damme, MD   Chief Complaint: hospital follow-up of CHF  History of Present Illness:    [name] is a 72 y.o. 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 Rennis Golden and presents today for hospital follow-up of CHF.  Patient has primarily been followed by Dr. Rennis Golden for paroxysmal atrial fibrillation.  He was initially diagnosed with atrial fibrillation in 05/2014 during an admission for pneumonia. He underwent successful TEE/ DCCV with restoration of sinus rhythm. He also has chronic lower extremity edema due to venous stasis and is bedbound due to severe peripheral neuropathy. He has not had an in-person office visit since 2019 due to being bedbound. His last virtual visit was in 05/2022 with Dr. Rennis Golden as which time he was doing well from a cardiac standpoint without any symptomatic atrial fibrillation. He was following with [facility] for a sacral decubitus ulcer.   He was recently admitted from 02/16/2023 to 02/27/2023 for acute hypoxic and hypercarbic respiratory failure secondary to acute CHF. He initially required BiPAP. Echo showed LVEF of 60-65% with normal wall motion, normal RV size and function, and no significant valvular disease. He was aggressively diuresed with IV Lasix. He was diuresed 17 lbs (net negative 21 L) and discharge weight was 310 lbs. He was discharged on Torsemide 60mg  twice daily and Spironolactone 25mg  daily. He was noted to be in atrial fibrillation  throughout entire admission and was started on Toprol-XL in addition to home Cardizem for additional rate control.   He was seen in the [facility] on 03/11/2023 at which time he was still in rate controlled atrial fibrillation but reported doing well from a cardiac standpoint. He was felt to likely have permanent atrial fibrillation and rate control was recommended.  Patient presents today for follow-up. Here with wife. He was transported to office via [facility] as he is completely bedbound. EMS reported that his O2 sat was 76% on arrival. They placed him on 2L of O2 via nasal cannula and O2 sats improved to the high 80s to low 90s which is is baseline per wife. He denies any real shortness of breath. His wife does report a wet wounding cough at night but this improves throughout the day. He denies any orthopnea or PND. His chronic lower extremity edema is stable. No chest pain, palpitations, lightheadedness, dizziness, or syncope. His wife states he will occasionally be a little lethargic and disoriented but nothing like what he was experiencing prior to recent hospitalization. I wonder if this is due to him retaining CO2.   Removed O2 during visit and O2 sats dropped to 83-85%. Sats improved back to 93% on 2L of O2. He has not been on any O2 at home although did require it during hospitalization.  EKGs/Labs/Other Studies Reviewed:    The following studies were reviewed:  Echocardiogram 02/16/2023: Impressions: 1. Left ventricular ejection fraction, by estimation, is 60 to 65%. The  left ventricle has normal function. The left ventricle has no regional  wall motion abnormalities. Left ventricular diastolic parameters are  indeterminate.   2. Right ventricular systolic function is normal. The right ventricular  size is normal.   3. The mitral valve is normal in structure. No evidence of mitral valve  regurgitation. No evidence of mitral stenosis.   4. The aortic valve is tricuspid.  Aortic valve regurgitation is not  visualized. No aortic stenosis is present.  _______________  ABIs/TBIs 02/16/2023: Summary:  Right: Resting right ankle-brachial index indicates noncompressible right  lower extremity arteries.   Possibly clacified, compression was present but at high pressures.  Left: Resting left ankle-brachial index is within normal range.    EKG:  EKG not ordered today.   Recent Labs: 02/16/2023: B Natriuretic Peptide 198.7 02/27/2023: ALT 15; Hemoglobin 12.6; Platelets 329 03/11/2023: BUN 22; Creatinine, Ser 0.87; Magnesium 2.1; Potassium 4.3; Sodium 136  Recent Lipid Panel    Component Value Date/Time   CHOL 136 10/17/2013 0000   TRIG 65 10/17/2013 0000   HDL 45 10/17/2013 0000   LDLCALC 78 10/17/2013 0000    Physical Exam:    Vital Signs: BP (!) 103/53   Pulse 87   Ht 6\' 5"  (1.956 m)   Wt (!) 309 lb (140.2 kg) Comment: per wife report, pt bed bound  SpO2 92%   BMI 36.64 kg/m     Wt Readings from Last 3 Encounters:  03/24/23 (!) 309 lb (140.2 kg)  03/11/23 (!) 309 lb (140.2 kg)  02/27/23 (!) 310 lb 13.6 oz (141 kg)     General: 72 y.o. morbidly obese male in no acute distress. HEENT: Normocephalic and atraumatic. Sclera clear.  Neck: Supple. JVD difficult to assess via body habitus. Heart: Irregularly irregular rhythm with normal rate.  No murmurs, gallops, or rubs.  Lungs: No increased work of breathing. Initially had some crackles but this resolved with coughing. Clear to ausculation bilaterally. Abdomen: Soft, non-distended, and non-tender to palpation.  Extremities: No significant lower extremity edema.  Chronic skin changes consistent with venous stasis.   Skin: Warm and dry. Neuro: No focal deficits. Psych: Normal affect. Responds appropriately.   Assessment:    1. Chronic diastolic CHF (congestive heart failure) (HCC)   2. Permanent atrial fibrillation (HCC)   3. PAD (peripheral artery disease) (HCC)   4. Bilateral lower  extremity edema   5. Hypertension, unspecified type   6. Hyperlipidemia, unspecified hyperlipidemia type   7. Type 2 diabetes mellitus with complication, without long-term current use of insulin (HCC)     Plan:    Chronic Diastolic CHF Patient was recently admitted for acute CHF. Echo showed LVEF of 60-65% with normal wall motion, normal RV size and function, and no significant valvular disease. He was diuresed 17 lbs.  - Difficult to assess volume status due to body habitus. - Will check BNP and BMET.  - Continue Torsemide 60mg  twice daily.  - Continue Spironolactone 25mg  daily.  - Will not use SGLT2 inhibitors given he is bedbound and prone to UTIs.   Chronic Hypoxic Respiratory Failure Patient is bedbound due to severe peripheral neuropathy. [facility] brought patient to visit and reported O2 sat was 76% on their arrival. They placed him on 2L of O2 via nasal cannula and O2 sats improved to the high 80s to low 90s which is is baseline per wife. He has not bee on O2 at home but did require it during recent admission. I briefly removed O2 during visit and O2 sat dropped back down to 83-85% but O2 improved  to 93% when placed back on 2L - He denies any significant shortness of breath.  - He meets requirements for home O2. Will place order. Will also refer to Pulmonology to help manage this.  - Suspect he also has obstructive sleep apnea/ obesity hypoventilation syndrome. STOP Bang score = 4 (indicating he is at intermediate risk for moderate to severe OSA). Will order split night sleep study.  SATURATION QUALIFICATIONS: (This note is used to comply with regulatory documentation for home oxygen) - Patient Saturations on Room Air at Rest = 83-85% - Patient Saturation on 2 Liters of oxygen at Rest = 93%. - Patient Saturations on Room Air while Ambulating = Unable to perform (patient is completely bedbound)  Please briefly explain why patient needs home oxygen: O2 saturations are in the low 80s on  room air while at rest. Unable to perform saturations while ambulating as patient is bedbound but O2 sat improved to 93% on 2L of O2 at rest.  Permanent Atrial Fibrillation Rate controlled.  - Continue Cardizem CD 360mg  daily. - Continue Toprol-XL 50mg  daily.  - Continue chronic anticoagulation with Pradaxa 150mg  twice daily.   PAD ABIs in 02/2023 during recent admission showed non-compressible arteries on the right but were normal on the left. - Continue Pradaxa and Lipitor as above.  Chronic Lower Extremity Edema  Patient has a history of chronic lower extremity secondary to venous stasis.  - Stable. - Continue Torsemide and Spironolactone as above.  Hypertension BP well controlled.  - Continue Cardizem CD, Toprol-XL, and Spironolactone as above.  Hyperlipidemia - Continue Lipitor 10mg  daily.   Type 2 Diabetes Mellitus Severe Peripheral Neuropathy Patient has a history of type 2 diabetes with severe peripheral neuropathy.  - Hemoglobin A1c 8.0% on 02/16/2023.  - On Metformin and Glipizide. - Management per PCP.   Disposition: Patient already has a virtual follow-up visit with Dr. Rennis Golden scheduled for 05/2023.    Signed, Corrin Parker, PA-C  03/24/2023 10:13 AM    [facility]

## 2023-03-13 NOTE — Progress Notes (Signed)
DEMARIS, LEAVELL ([id number]) [id number] Page 1 of 10 Visit Report for 03/12/2023 Chief Complaint Document Details Patient Name: Date of Service: [name] 03/12/2023 10:15 A M Medical Record Number: [id number] Patient Account Number: [id number] Date of Birth/Sex: Treating RN: 04/05/1951 (72 y.o. M) Primary Care Provider: Caffie Damme Other Clinician: Referring Provider: Treating Provider/Extender: Haze Rushing in Treatment: 39 Information Obtained from: Patient Chief Complaint Here for follow up abdominal wound present following laparotomy, colostomy. 03/19/2022: Here for pressure ulcer on left gluteus and ulcer adjacent to ileostomy Electronic Signature(s) Signed: 03/12/2023 11:46:37 AM By: Duanne Guess MD FACS Entered By: Duanne Guess on 03/12/2023 11:46:37 -------------------------------------------------------------------------------- Debridement Details Patient Name: Date of Service: [name]. 03/12/2023 10:15 A M Medical Record Number: [id number] Patient Account Number: [id number] Date of Birth/Sex: Treating RN: [birth year] (72 y.o. [name] Primary Care Provider: Caffie Damme Other Clinician: Referring Provider: Treating Provider/Extender: Haze Rushing in Treatment: 51 Debridement Performed for Assessment: Wound #3 Left Gluteus Performed By: Physician Duanne Guess, MD The following information was scribed by: Redmond Pulling The information was scribed for: Duanne Guess Debridement Type: Debridement Level of Consciousness (Pre-procedure): Awake and Alert Pre-procedure Verification/Time Out Yes - 11:08 Taken: Start Time: 11:08 Pain Control: Lidocaine 5% topical ointment Percent of Wound Bed Debrided: 65% T Area Debrided (cm): otal 95.42 Tissue and other material debrided: Non-Viable, Eschar, Slough, Subcutaneous, Slough Level: Skin/Subcutaneous  Tissue Debridement Description: Excisional Instrument: Curette Bleeding: Minimum Hemostasis Achieved: Pressure Response to Treatment: Procedure was tolerated well Level of Consciousness (Post- Awake and Alert procedure): Post Debridement Measurements of Total Wound Length: (cm) 17 Stage: Category/Stage III Width: (cm) 11 Depth: (cm) 0.1 Volume: (cm) 14.687 Character of Wound/Ulcer Post Debridement: Improved CONNOR, MEACHAM ([id number]) [phone].pdf Page 2 of 10 Post Procedure Diagnosis Same as Pre-procedure Electronic Signature(s) Signed: 03/12/2023 3:34:18 PM By: Duanne Guess MD FACS Signed: 03/12/2023 4:34:06 PM By: Redmond Pulling RN, BSN Entered By: Redmond Pulling on 03/12/2023 11:09:46 -------------------------------------------------------------------------------- HPI Details Patient Name: Date of Service: [name]. 03/12/2023 10:15 A M Medical Record Number: [id number] Patient Account Number: [id number] Date of Birth/Sex: Treating RN: Mar 23, 1951 (72 y.o. M) Primary Care Provider: Caffie Damme Other Clinician: Referring Provider: Treating Provider/Extender: Haze Rushing in Treatment: 66 History of Present Illness HPI Description: On medihoney with border dressing due to stalling on collagen and concern ABD was pulling off skin. 09/11/14 Nearly healed, counseled given the thin attenuated scar skin I expect will have some recurrence wounds with slight trauma or maceration, however much improved today. cont ABD pads and medihoney. 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 He  resides with his sister and is on a regular hospital bed. The 2 of them reported that for about the last year, he has had ulceration adjacent to his ileostomy as well as on his left gluteus and upper posterior thigh. Most recent hemoglobin A1c was 6.3%. On his left gluteus, there is a stage II pressure ulcer with surrounding tissue maceration and excoriation. There is a little slough on the wound surface. Adjacent to his ileostomy, associated with his appliance, there is an ulcer that extends into the fat layer. It is clean without  any slough or necrotic tissue. 04/10/2022: The left gluteal ulcer has expanded and is quite a bit larger today. There is slough and eschar accumulation. The patient's sister reports that the ileostomy associated wound got better and so they did not go to see the outpatient ostomy nurse. They have not received the low-air-loss mattress. 05/01/2022: Apparently the low-air-loss mattress has been denied by his insurance, but they are willing to cover a gel mattress overlay. The ileostomy-area wound has contracted further, per the patient's sister; we are not managing this wound at this time. The left gluteal ulcer seems to be about the same size, but there is extensive slough on all of the open surfaces. His skin is extremely friable. 05/22/2022: The gel mattress overlay was finally delivered last week. The left gluteal ulcer has deteriorated somewhat. There is slough on all of the open surfaces and the total area of the wound is larger. It has a funky odor to it today. 06/13/2022: The gluteal wound is smaller and has substantially less slough. Epithelium is filling in around the margins. The odor that was present at his last visit has dissipated. 07/03/2022: The gluteal wound is a little bit smaller again today. It seems more superficial and just has a light layer of slough on the surface. 08/07/2022: No significant change to the gluteal wound. The patient's sister does report that  it has bled less and seems less friable. 09/05/2022: The gluteal wound looks a little bit better, with smaller dimensions and less friable tissue. He has had some breakdown on his perineum, adjacent to his scrotum. It appears similar in nature to the gluteal wound. 10/03/2022: The wounds all look a little bit more superficial today. They are less friable. He does have a lot of scaly dry skin around each of the wound areas. There is slough on the wound surfaces. 11/11/2022: The wound dimensions are basically unchanged. The perineal ulcer looks a little bit deeper. He has scaly shaggy skin hanging from all of the wound surfaces along with some slough. 12/11/2022: All of the open areas have converged and appear deeper today. There is slough on the surfaces along with some hypertrophic granulation tissue. 01/08/2023: There has been some epithelialization between the wound sites so we are back to a patchwork of open areas. There is some slough accumulation on the surfaces with a little bit of eschar. No malodor or purulent drainage. 02/05/2023: The wound area measured larger today. The patient's sister thinks that the Medihoney is keeping things too wet and contributed to some moisture- related breakdown. There is slough on all of the open wound surfaces. 03/12/2023: The patient was hospitalized about a week ago for hypoxemia and CHF. The wounds have enlarged somewhat since I last saw them. There is slough accumulation of all of the surfaces. Electronic Signature(s) Signed: 03/12/2023 11:59:58 AM By: Duanne Guess MD FACS [place], Madolyn Frieze ([id number]) [id number] Page 3 of 10 Signed: 03/12/2023 11:59:58 AM By: Duanne Guess MD FACS Entered By: Duanne Guess on 03/12/2023 11:59:58 -------------------------------------------------------------------------------- Physical Exam Details Patient Name: Date of Service: [name] 03/12/2023 10:15 A M Medical Record  Number: [id number] Patient Account Number: [id number] Date of Birth/Sex: Treating RN: [birth year] (72 y.o. M) Primary Care Provider: Caffie Damme Other Clinician: Referring Provider: Treating Provider/Extender: Murvin Natal Weeks in Treatment: 51 Constitutional . . . . no acute distress. Respiratory Normal work of breathing on room air.. Notes 03/12/2023: The patient was hospitalized about a week ago for hypoxemia and CHF.  The wounds have enlarged somewhat since I last saw them. There is slough accumulation of all of the surfaces. Electronic Signature(s) Signed: 03/12/2023 12:00:27 PM By: Duanne Guess MD FACS Entered By: Duanne Guess on 03/12/2023 12:00:27 -------------------------------------------------------------------------------- Physician Orders Details Patient Name: Date of Service: [name] 03/12/2023 10:15 A M Medical Record Number: [id number] Patient Account Number: [id number] Date of Birth/Sex: Treating RN: 1951/01/18 (72 y.o. [name] Primary Care Provider: Caffie Damme Other Clinician: Referring Provider: Treating Provider/Extender: Haze Rushing in Treatment: 13 Verbal / Phone Orders: No Diagnosis Coding ICD-10 Coding Code Description [phone] Pressure ulcer of left buttock, 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 E66.01 Morbid (severe) obesity due to excess calories G62.89 Other specified polyneuropathies Z93.2 Ileostomy status Follow-up Appointments Return appointment in 1 month. - *****STRETCHER*******Dr. Lady Gary Rm 3 Anesthetic Wound #3 Left Gluteus (In clinic) Topical Lidocaine 4% applied to wound bed Bathing/ Shower/ Hygiene May shower and wash wound with soap and water. Off-Loading MACAI, SISNEROS ([id number]) [id number] Page 4 of 10 Wound #3 Left Gluteus Gel mattress overlay (Group 1) Turn and reposition every 2 hours Wound  Treatment Wound #3 - Gluteus Wound Laterality: Left Cleanser: Soap and Water 1 x Per Day/30 Days Discharge Instructions: May shower and wash wound with dial antibacterial soap and water prior to dressing change. Cleanser: Wound Cleanser (Generic) 1 x Per Day/30 Days Discharge Instructions: Cleanse the wound with wound cleanser prior to applying a clean dressing using gauze sponges, not tissue or cotton balls. Topical: Gentamicin 1 x Per Day/30 Days Discharge Instructions: As directed by physician Topical: Mupirocin Ointment 1 x Per Day/30 Days Discharge Instructions: Apply Mupirocin (Bactroban) as instructed Prim Dressing: Maxorb Extra Ag+ Alginate Dressing, 4x4.75 (in/in) (Generic) 1 x Per Day/30 Days ary Discharge Instructions: Apply to wound bed as instructed Secondary Dressing: ABD Pad, 8x10 (Generic) 1 x Per Day/30 Days Discharge Instructions: Apply over primary dressing as directed. Secured With: 68M Medipore H Soft Cloth Surgical T ape, 4 x 10 (in/yd) (Generic) 1 x Per Day/30 Days Discharge Instructions: Secure with tape as directed. Patient Medications llergies: penicillin A Notifications Medication Indication Start End 03/12/2023 lidocaine DOSE topical 5 % cream - cream topical once daily Electronic Signature(s) Signed: 03/12/2023 3:34:18 PM By: Duanne Guess MD FACS Entered By: Duanne Guess on 03/12/2023 12:01:08 -------------------------------------------------------------------------------- Problem List Details Patient Name: Date of Service: [name]. 03/12/2023 10:15 A M Medical Record Number: [id number] Patient Account Number: [id number] Date of Birth/Sex: Treating RN: Nov 26, 1950 (72 y.o. M) Primary Care Provider: Caffie Damme Other Clinician: Referring Provider: Treating Provider/Extender: Murvin Natal Weeks in Treatment: 73 Active Problems ICD-10 Encounter Code Description Active Date MDM Diagnosis [phone] Pressure ulcer of  left buttock, stage 3 03/19/2022 No Yes E11.622 Type 2 diabetes mellitus with other skin ulcer 03/19/2022 No Yes E66.01 Morbid (severe) obesity due to excess calories 03/19/2022 No Yes LONDON, TARNOWSKI ([id number]) [phone].pdf Page 5 of 10 G62.89 Other specified polyneuropathies 03/19/2022 No Yes Z93.2 Ileostomy status 03/19/2022 No Yes Inactive Problems ICD-10 Code Description Active Date Inactive Date L98.492 Non-pressure chronic ulcer of skin of other sites with fat layer exposed 03/19/2022 03/19/2022 Resolved Problems Electronic Signature(s) Signed: 03/12/2023 11:46:00 AM By: Duanne Guess MD FACS Entered By: Duanne Guess on 03/12/2023 11:46:00 -------------------------------------------------------------------------------- Progress Note Details Patient Name: Date of Service: [name]. 03/12/2023 10:15 A M Medical Record Number: [id number] Patient Account Number: [id number] Date of Birth/Sex: Treating RN:  March 08, 1951 (72 y.o. M) Primary Care Provider: Caffie Damme Other Clinician: Referring Provider: Treating Provider/Extender: Haze Rushing in Treatment: 78 Subjective Chief Complaint Information obtained from Patient Here for follow up abdominal wound present following laparotomy, colostomy. 03/19/2022: Here for pressure ulcer on left gluteus and ulcer adjacent to ileostomy History of Present Illness (HPI) On medihoney with border dressing due to stalling on collagen and concern ABD was pulling off skin. 09/11/14 Nearly healed, counseled given the thin attenuated scar skin I expect will have some recurrence wounds with slight trauma or maceration, however much improved today. cont ABD pads and medihoney. 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 He resides with his sister and is on a regular hospital bed. The 2 of them reported that for about the last year, he has had ulceration adjacent to his ileostomy as well as on his left gluteus and upper posterior thigh. Most recent hemoglobin A1c was 6.3%. On his left gluteus, there is a stage II pressure ulcer with surrounding tissue maceration and excoriation. There is a little slough on the wound surface. Adjacent to his ileostomy, associated with his appliance, there is an ulcer that extends into the fat layer. It is clean without any slough or necrotic tissue. 04/10/2022: The left gluteal ulcer has expanded and is quite a bit larger today. There is slough and eschar accumulation. The patient's sister reports that the ileostomy associated wound got better and so they did not go to see the outpatient ostomy nurse. They have not received the low-air-loss mattress. 05/01/2022: Apparently the low-air-loss mattress has been denied by his insurance, but they are willing to cover a gel mattress overlay. The ileostomy-area wound has contracted further, per the patient's sister; we are not managing this wound at this time. The left gluteal ulcer seems to be about the same size, but there is extensive slough on all of the open surfaces. His skin is extremely friable. 05/22/2022: The gel mattress overlay was finally delivered last week. The left gluteal ulcer has deteriorated somewhat. There is slough on all of the open surfaces and the total area of the wound is larger. It has a funky odor to it today. 06/13/2022: The gluteal wound is smaller and has substantially less slough. Epithelium is filling in around the margins. The odor that was present at his last visit has dissipated. 07/03/2022: The gluteal wound is a little bit smaller again today. It  seems more superficial and just has a light layer of slough on the surface. 08/07/2022: No significant change to the gluteal wound. The patient's sister does report that it has bled less and seems less friable. KIEV, LABROSSE ([id number]) [id number] Page 6 of 10 09/05/2022: The gluteal wound looks a little bit better, with smaller dimensions and less friable tissue. He has had some breakdown on his perineum, adjacent to his scrotum. It appears similar in nature to the gluteal wound. 10/03/2022: The wounds all look a little bit more superficial today. They are less friable. He does have a lot of scaly dry skin around each of the wound areas. There is slough on the wound surfaces. 11/11/2022: The wound dimensions are basically unchanged. The perineal ulcer looks a little bit deeper. He has scaly shaggy  skin hanging from all of the wound surfaces along with some slough. 12/11/2022: All of the open areas have converged and appear deeper today. There is slough on the surfaces along with some hypertrophic granulation tissue. 01/08/2023: There has been some epithelialization between the wound sites so we are back to a patchwork of open areas. There is some slough accumulation on the surfaces with a little bit of eschar. No malodor or purulent drainage. 02/05/2023: The wound area measured larger today. The patient's sister thinks that the Medihoney is keeping things too wet and contributed to some moisture- related breakdown. There is slough on all of the open wound surfaces. 03/12/2023: The patient was hospitalized about a week ago for hypoxemia and CHF. The wounds have enlarged somewhat since I last saw them. There is slough accumulation of all of the surfaces. Patient History Information obtained from Patient. Family History Cancer - Father,Mother, Diabetes - Father,Mother,Siblings, Heart Disease - Mother,Siblings, Hypertension - Father,Siblings, Kidney Disease - Siblings. Social  History Former smoker - ended on 08/10/2013, Marital Status - Single, Alcohol Use - Rarely, Drug Use - No History, Caffeine Use - Rarely. Medical History Constitutional Symptoms (General Health) Patient has history of Congestive Heart Failure Eyes Denies history of Cataracts, Glaucoma, Optic Neuritis Ear/Nose/Mouth/Throat Denies history of Chronic sinus problems/congestion, Middle ear problems Cardiovascular Patient has history of Hypertension Endocrine Patient has history of Type I Diabetes Denies history of Type 1 Diabetes Genitourinary Denies history of End Stage Renal Disease Neurologic Patient has history of Neuropathy, Paraplegia Psychiatric Denies history of Anorexia/bulimia, Confinement Anxiety Medical A Surgical History Notes nd Cardiovascular atrial fibrillation Musculoskeletal arthritis Objective Constitutional no acute distress. Vitals Time Taken: 10:56 AM, Height: 77 in, Weight: 318 lbs, BMI: 37.7, Temperature: 98.0 F, Pulse: 76 bpm, Respiratory Rate: 20 breaths/min, Blood Pressure: 108/74 mmHg, Capillary Blood Glucose: 192 mg/dl. Respiratory Normal work of breathing on room air.. General Notes: 03/12/2023: The patient was hospitalized about a week ago for hypoxemia and CHF. The wounds have enlarged somewhat since I last saw them. There is slough accumulation of all of the surfaces. Integumentary (Hair, Skin) Wound #3 status is Open. Original cause of wound was Gradually Appeared. The date acquired was: 05/14/2021. The wound has been in treatment 51 weeks. The wound is located on the Left Gluteus. The wound measures 17cm length x 11cm width x 0.1cm depth; 146.869cm^2 area and 14.687cm^3 volume. There is Fat Layer (Subcutaneous Tissue) exposed. There is no tunneling or undermining noted. There is a medium amount of serosanguineous drainage noted. The wound margin is distinct with the outline attached to the wound base. There is large (67-100%) red, friable, hyper -  granulation within the wound bed. There is a small (1- 33%) amount of necrotic tissue within the wound bed including Adherent Slough. The periwound skin appearance had no abnormalities noted for color. The periwound skin appearance exhibited: Scarring, Dry/Scaly. The periwound skin appearance did not exhibit: Callus, Crepitus, Excoriation, Induration, Rash, Maceration. Periwound temperature was noted as No Abnormality. The periwound has tenderness on palpation. ANTE, ARREDONDO ([id number]) [id number] Page 7 of 10 Assessment Active Problems ICD-10 Pressure ulcer of left buttock, stage 3 Type 2 diabetes mellitus with other skin ulcer Morbid (severe) obesity due to excess calories Other specified polyneuropathies Ileostomy status Procedures Wound #3 Pre-procedure diagnosis of Wound #3 is a Pressure Ulcer located on the Left Gluteus . There was a Excisional Skin/Subcutaneous Tissue Debridement with a total area of 95.42 sq cm performed by Duanne Guess, MD. With the following  instrument(s): Curette to remove Non-Viable tissue/material. Material removed includes Eschar, Subcutaneous Tissue, and Slough after achieving pain control using Lidocaine 5% topical ointment. No specimens were taken. A time out was conducted at 11:08, prior to the start of the procedure. A Minimum amount of bleeding was controlled with Pressure. The procedure was tolerated well. Post Debridement Measurements: 17cm length x 11cm width x 0.1cm depth; 14.687cm^3 volume. Post debridement Stage noted as Category/Stage III. Character of Wound/Ulcer Post Debridement is improved. Post procedure Diagnosis Wound #3: Same as Pre-Procedure Plan Follow-up Appointments: Return appointment in 1 month. - *****STRETCHER*******Dr. Lady Gary Rm 3 Anesthetic: Wound #3 Left Gluteus: (In clinic) Topical Lidocaine 4% applied to wound bed Bathing/ Shower/ Hygiene: May shower and wash wound with soap and  water. Off-Loading: Wound #3 Left Gluteus: Gel mattress overlay (Group 1) Turn and reposition every 2 hours The following medication(s) was prescribed: lidocaine topical 5 % cream cream topical once daily was prescribed at facility WOUND #3: - Gluteus Wound Laterality: Left Cleanser: Soap and Water 1 x Per Day/30 Days Discharge Instructions: May shower and wash wound with dial antibacterial soap and water prior to dressing change. Cleanser: Wound Cleanser (Generic) 1 x Per Day/30 Days Discharge Instructions: Cleanse the wound with wound cleanser prior to applying a clean dressing using gauze sponges, not tissue or cotton balls. Topical: Gentamicin 1 x Per Day/30 Days Discharge Instructions: As directed by physician Topical: Mupirocin Ointment 1 x Per Day/30 Days Discharge Instructions: Apply Mupirocin (Bactroban) as instructed Prim Dressing: Maxorb Extra Ag+ Alginate Dressing, 4x4.75 (in/in) (Generic) 1 x Per Day/30 Days ary Discharge Instructions: Apply to wound bed as instructed Secondary Dressing: ABD Pad, 8x10 (Generic) 1 x Per Day/30 Days Discharge Instructions: Apply over primary dressing as directed. Secured With: 31M Medipore H Soft Cloth Surgical T ape, 4 x 10 (in/yd) (Generic) 1 x Per Day/30 Days Discharge Instructions: Secure with tape as directed. 03/12/2023: The patient was hospitalized about a week ago for hypoxemia and CHF. The wounds have enlarged somewhat since I last saw them. There is slough accumulation of all of the surfaces. I used a curette to debride slough, eschar, and subcutaneous tissue from the wounds. Given the deterioration, which is likely just due to his being in the hospital, I am going to reinstitute the topical gentamicin and mupirocin along with the silver alginate, at least until his next visit with [place] in 1 month's time. Electronic Signature(s) Signed: 03/12/2023 12:02:07 PM By: Duanne Guess MD FACS Entered By: Duanne Guess on 03/12/2023  12:02:07 [name] ([id number]) [id number] Page 8 of 10 -------------------------------------------------------------------------------- HxROS Details Patient Name: Date of Service: [name] 03/12/2023 10:15 A M Medical Record Number: [id number] Patient Account Number: [id number] Date of Birth/Sex: Treating RN: [birth year] (72 y.o. M) Primary Care Provider: Caffie Damme Other Clinician: Referring Provider: Treating Provider/Extender: Haze Rushing in Treatment: 64 Information Obtained From Patient Constitutional Symptoms (General Health) Medical History: Positive for: Congestive Heart Failure Eyes Medical History: Negative for: Cataracts; Glaucoma; Optic Neuritis Ear/Nose/Mouth/Throat Medical History: Negative for: Chronic sinus problems/congestion; Middle ear problems Cardiovascular Medical History: Positive for: Hypertension Past Medical History Notes: atrial fibrillation Endocrine Medical History: Positive for: Type I Diabetes Negative for: Type 1 Diabetes Time with diabetes: less then 10 Treated with: Oral agents Genitourinary Medical History: Negative for: End Stage Renal Disease Musculoskeletal Medical History: Past Medical History Notes: arthritis Neurologic Medical History: Positive for: Neuropathy; Paraplegia Psychiatric Medical History: Negative for: Anorexia/bulimia; Confinement Anxiety Immunizations Pneumococcal Vaccine: Received  Pneumococcal Vaccination: Yes Received Pneumococcal Vaccination On or After 60th Birthday: Yes Implantable Devices None BOBBIE, VALLETTA Byron ([id number]) [id number] Page 9 of 10 Family and Social History Cancer: Yes - Father,Mother; Diabetes: Yes - Father,Mother,Siblings; Heart Disease: Yes - Mother,Siblings; Hypertension: Yes - Father,Siblings; Kidney Disease: Yes - Siblings; Former smoker - ended on 08/10/2013; Marital Status - Single;  Alcohol Use: Rarely; Drug Use: No History; Caffeine Use: Rarely; Financial Concerns: No; Food, Clothing or Shelter Needs: No; Support System Lacking: No; Transportation Concerns: No Electronic Signature(s) Signed: 03/12/2023 3:34:18 PM By: Duanne Guess MD FACS Entered By: Duanne Guess on 03/12/2023 12:00:05 -------------------------------------------------------------------------------- SuperBill Details Patient Name: Date of Service: [name] 03/12/2023 Medical Record Number: [id number] Patient Account Number: [id number] Date of Birth/Sex: Treating RN: [birth year] (72 y.o. 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 Facility Procedures : CPT4 Code: [id number] Description: 11042 - DEB SUBQ TISSUE 20 SQ CM/< ICD-10 Diagnosis Description L89.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, stage 3 Modifier: Quantity: 1 : CPT4 Code: [id number] Description: 11045 - DEB SUBQ TISS EA ADDL 20CM ICD-10 Diagnosis Description L89.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, stage 3 Modifier: Quantity: 4 Physician Procedures : CPT4 Code Description Modifier [id number] 99214 - WC PHYS LEVEL 4 - EST PT ICD-10 Diagnosis Description L89.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, 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 E66.01 Morbid (severe) obesity due to excess  calories 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

## 2023-03-13 NOTE — Progress Notes (Signed)
Bruce, [name] [facility] ([id number]) [id number] Page 1 of 7 Visit Report for 03/12/2023 Arrival Information Details Patient Name: Date of Service: [name] 03/12/2023 10:15 A M Medical Record Number: [id number] Patient Account Number: [id number] Date of Birth/Sex: Treating RN: January 01, 1951 (72 y.o. [name] Primary Care Ewelina Naves: Caffie Damme Other Clinician: Referring Marlee Armenteros: Treating Sherrol Vicars/Extender: Haze Rushing in Treatment: 58 Visit Information History Since Last Visit Added or deleted any medications: Yes Patient Arrived: Stretcher Any new allergies or adverse reactions: No Arrival Time: 10:55 Had a fall or experienced change in No Accompanied By: sister activities of daily living that may affect Transfer Assistance: None risk of falls: Patient Identification Verified: Yes Signs or symptoms of abuse/neglect since last visito No Secondary Verification Process Completed: Yes Hospitalized since last visit: Yes Patient Requires Transmission-Based Precautions: No Implantable device outside of the clinic excluding No Patient Has Alerts: No cellular tissue based products placed in the center since last visit: Has Dressing in Place as Prescribed: Yes Pain Present Now: Yes Electronic Signature(s) Signed: 03/12/2023 4:34:06 PM By: Redmond Pulling RN, BSN Entered By: Redmond Pulling on 03/12/2023 10:55:56 -------------------------------------------------------------------------------- Encounter Discharge Information Details Patient Name: Date of Service: [name]. 03/12/2023 10:15 A M Medical Record Number: [id number] Patient Account Number: [id number] Date of Birth/Sex: Treating RN: 05-03-1951 (72 y.o. [name] Primary Care Wally Behan: Caffie Damme Other Clinician: Referring Emmalou Hunger: Treating Kimla Furth/Extender: Haze Rushing in Treatment: 22 Encounter Discharge Information Items  Post Procedure Vitals Discharge Condition: Stable Temperature (F): 98.0 Ambulatory Status: Stretcher Pulse (bpm): 76 Discharge Destination: Home Respiratory Rate (breaths/min): 20 Transportation: Ambulance Blood Pressure (mmHg): 108/74 Accompanied By: sister Schedule Follow-up Appointment: Yes Clinical Summary of Care: Patient Declined Electronic Signature(s) Signed: 03/12/2023 4:34:06 PM By: Redmond Pulling RN, BSN Entered By: Redmond Pulling on 03/12/2023 12:07:50 [name] ([id number]) [id number] Page 2 of 7 -------------------------------------------------------------------------------- Lower Extremity Assessment Details Patient Name: Date of Service: [name] 03/12/2023 10:15 A M Medical Record Number: [id number] Patient Account Number: [id number] Date of Birth/Sex: Treating RN: [birth year] (72 y.o. [name] Primary Care Leeya Rusconi: Caffie Damme Other Clinician: Referring Atticus Lemberger: Treating Derinda Bartus/Extender: Murvin Natal Weeks in Treatment: 40 Electronic Signature(s) Signed: 03/12/2023 4:34:06 PM By: Redmond Pulling RN, BSN Entered By: Redmond Pulling on 03/12/2023 10:57:07 -------------------------------------------------------------------------------- Multi Wound Chart Details Patient Name: Date of Service: [name]. 03/12/2023 10:15 A M Medical Record Number: [id number] Patient Account Number: [id number] Date of Birth/Sex: Treating RN: 14-Jan-1951 (72 y.o. M) Primary Care Wyolene Weimann: Caffie Damme Other Clinician: Referring Obrian Bulson: Treating Danamarie Minami/Extender: Murvin Natal Weeks in Treatment: 29 Vital Signs Height(in): 77 Capillary Blood Glucose(mg/dl): 035 Weight(lbs): 009 Pulse(bpm): 76 Body Mass Index(BMI): 37.7 Blood Pressure(mmHg): 108/74 Temperature(F): 98.0 Respiratory Rate(breaths/min): 20 [3:Photos:] [N/A:N/A] Left Gluteus N/A N/A Wound Location: Gradually Appeared  N/A N/A Wounding Event: Pressure Ulcer N/A N/A Primary Etiology: Congestive Heart Failure, N/A N/A Comorbid History: Hypertension, Type I Diabetes, Neuropathy, Paraplegia 05/14/2021 N/A N/A Date Acquired: 80 N/A N/A Weeks of Treatment: Open N/A N/A Wound Status: No N/A N/A Wound Recurrence: 17x11x0.1 N/A N/A Measurements L x W x D (cm) 146.869 N/A N/A A (cm) : rea 14.687 N/A N/A Volume (cm) : 12.00% N/A N/A % Reduction in A rea: 12.00% N/A N/A % Reduction in Volume: Category/Stage III N/A N/A Classification: Medium N/A N/A Exudate A mount: Serosanguineous N/A N/A Exudate Type: red, brown N/A N/A Exudate Color: Distinct, outline attached N/A  N/A Wound Margin: Large (67-100%) N/A N/A Granulation A mount: Red, Hyper-granulation, Friable N/A N/A Granulation QualityLAMOINE, MAGALLON ([id number]) [id number] Page 3 of 7 Small (1-33%) N/A N/A Necrotic Amount: Fat Layer (Subcutaneous Tissue): Yes N/A N/A Exposed Structures: Fascia: No Tendon: No Muscle: No Joint: No Bone: No Small (1-33%) N/A N/A Epithelialization: Debridement - Excisional N/A N/A Debridement: Pre-procedure Verification/Time Out 11:08 N/A N/A Taken: Lidocaine 5% topical ointment N/A N/A Pain Control: Necrotic/Eschar, Subcutaneous, N/A N/A Tissue Debrided: Slough Skin/Subcutaneous Tissue N/A N/A Level: 95.42 N/A N/A Debridement A (sq cm): rea Curette N/A N/A Instrument: Minimum N/A N/A Bleeding: Pressure N/A N/A Hemostasis Achieved: Debridement Treatment Response: Procedure was tolerated well N/A N/A Post Debridement Measurements L x 17x11x0.1 N/A N/A W x D (cm) 14.687 N/A N/A Post Debridement Volume: (cm) Category/Stage III N/A N/A Post Debridement Stage: Scarring: Yes N/A N/A Periwound Skin Texture: Excoriation: No Induration: No Callus: No Crepitus: No Rash: No Dry/Scaly: Yes N/A N/A Periwound Skin Moisture: Maceration: No Atrophie Blanche: No  N/A N/A Periwound Skin Color: Cyanosis: No Ecchymosis: No Erythema: No Hemosiderin Staining: No Mottled: No Pallor: No Rubor: No No Abnormality N/A N/A Temperature: Yes N/A N/A Tenderness on Palpation: Debridement N/A N/A Procedures Performed: Treatment Notes Electronic Signature(s) Signed: 03/12/2023 11:46:11 AM By: Duanne Guess MD FACS Entered By: Duanne Guess on 03/12/2023 11:46:11 -------------------------------------------------------------------------------- Multi-Disciplinary Care Plan Details Patient Name: Date of Service: [name]. 03/12/2023 10:15 A M Medical Record Number: [id number] Patient Account Number: [id number] Date of Birth/Sex: Treating RN: February 15, 1951 (72 y.o. [name] Primary Care Keiasia Christianson: Caffie Damme Other Clinician: Referring Hung Rhinesmith: Treating Artice Bergerson/Extender: Haze Rushing in Treatment: 67 Multidisciplinary Care Plan reviewed with physician Active Inactive Nutrition Nursing Diagnoses: Potential for alteratiion in Nutrition/Potential for imbalanced nutrition Goals: Patient/caregiver verbalizes understanding of need to maintain therapeutic glucose control per primary care physician Date Initiated: 03/19/2022 Target Resolution Date: 03/10/2023 ZACCARY, CREECH ([id number]) [id number] Page 4 of 7 Goal Status: Active Interventions: Provide education on elevated blood sugars and impact on wound healing Treatment Activities: Dietary management education, guidance and counseling : 03/19/2022 Notes: Wound/Skin Impairment Nursing Diagnoses: Knowledge deficit related to ulceration/compromised skin integrity Goals: Ulcer/skin breakdown will have a volume reduction of 30% by week 4 Date Initiated: 03/19/2022 Target Resolution Date: 03/10/2023 Goal Status: Active Interventions: Assess ulceration(s) every visit Provide education on ulcer and skin care Treatment Activities: Skin  care regimen initiated : 03/19/2022 Notes: Electronic Signature(s) Signed: 03/12/2023 4:34:06 PM By: Redmond Pulling RN, BSN Entered By: Redmond Pulling on 03/12/2023 12:05:42 -------------------------------------------------------------------------------- Pain Assessment Details Patient Name: Date of Service: [name]. 03/12/2023 10:15 A M Medical Record Number: [id number] Patient Account Number: [id number] Date of Birth/Sex: Treating RN: 07/03/1950 (72 y.o. [name] Primary Care Alexxander Kurt: Caffie Damme Other Clinician: Referring Madinah Quarry: Treating Daysen Gundrum/Extender: Haze Rushing in Treatment: 67 Active Problems Location of Pain Severity and Description of Pain Patient Has Paino Yes Site Locations Pain Location: Generalized Pain Rate the pain. Current Pain Level: 9 Pain Management and Medication Current Pain Management: Medication: [address] OLUWAFEMI, VILLELLA ([id number]) [id number] Page 5 of 7 Electronic Signature(s) Signed: 03/12/2023 4:34:06 PM By: Redmond Pulling RN, BSN Entered By: Redmond Pulling on 03/12/2023 10:56:28 -------------------------------------------------------------------------------- Patient/Caregiver Education Details Patient Name: Date of Service: [name] 10/31/2024andnbsp10:15 A M Medical Record Number: [id number] Patient Account Number: [id number] Date of Birth/Gender: Treating RN: May 17, 1950 (72 y.o. [name] Primary Care Physician: Caffie Damme Other Clinician:  Referring Physician: Treating Physician/Extender: Haze Rushing in Treatment: 19 Education Assessment Education Provided To: Patient Education Topics Provided Wound/Skin Impairment: Methods: Explain/Verbal Responses: State content correctly Electronic Signature(s) Signed: 03/12/2023 4:34:06 PM By: Redmond Pulling RN, BSN Entered By: Redmond Pulling on 03/12/2023  12:05:59 -------------------------------------------------------------------------------- Wound Assessment Details Patient Name: Date of Service: [name]. 03/12/2023 10:15 A M Medical Record Number: [id number] Patient Account Number: [id number] Date of Birth/Sex: Treating RN: December 26, 1950 (72 y.o. [name] Primary Care Tineshia Becraft: Caffie Damme Other Clinician: Referring Tsuneo Faison: Treating Ravenne Wayment/Extender: Murvin Natal Weeks in Treatment: 51 Wound Status Wound Number: 3 Primary Pressure Ulcer Etiology: Wound Location: Left Gluteus Wound Open Wounding Event: Gradually Appeared Status: Date Acquired: 05/14/2021 Comorbid Congestive Heart Failure, Hypertension, Type I Diabetes, Weeks Of Treatment: 51 History: Neuropathy, Paraplegia Clustered Wound: No Photos JAYSIAH, MARCHETTA ([id number]) [id number] Page 6 of 7 Wound Measurements Length: (cm) 17 Width: (cm) 11 Depth: (cm) 0.1 Area: (cm) 146.869 Volume: (cm) 14.687 % Reduction in Area: 12% % Reduction in Volume: 12% Epithelialization: Small (1-33%) Tunneling: No Undermining: No Wound Description Classification: Category/Stage III Wound Margin: Distinct, outline attached Exudate Amount: Medium Exudate Type: Serosanguineous Exudate Color: red, brown Foul Odor After Cleansing: No Slough/Fibrino Yes Wound Bed Granulation Amount: Large (67-100%) Exposed Structure Granulation Quality: Red, Hyper-granulation, Friable Fascia Exposed: No Necrotic Amount: Small (1-33%) Fat Layer (Subcutaneous Tissue) Exposed: Yes Necrotic Quality: Adherent Slough Tendon Exposed: No Muscle Exposed: No Joint Exposed: No Bone Exposed: No Periwound Skin Texture Texture Color No Abnormalities Noted: No No Abnormalities Noted: Yes Callus: No Temperature / Pain Crepitus: No Temperature: No Abnormality Excoriation: No Tenderness on Palpation: Yes Induration: No Rash: No Scarring:  Yes Moisture No Abnormalities Noted: No Dry / Scaly: Yes Maceration: No Treatment Notes Wound #3 (Gluteus) Wound Laterality: Left Cleanser Soap and Water Discharge Instruction: May shower and wash wound with dial antibacterial soap and water prior to dressing change. Wound Cleanser Discharge Instruction: Cleanse the wound with wound cleanser prior to applying a clean dressing using gauze sponges, not tissue or cotton balls. Peri-Wound Care Topical Gentamicin Discharge Instruction: As directed by physician Mupirocin Ointment Discharge Instruction: Apply Mupirocin (Bactroban) as instructed Primary Dressing Maxorb Extra Ag+ Alginate Dressing, 4x4.75 (in/in) Discharge Instruction: Apply to wound bed as instructed Secondary Dressing VINT, POLA ([id number]) [id number] Page 7 of 7 ABD Pad, 8x10 Discharge Instruction: Apply over primary dressing as directed. Secured With 62M Medipore H Soft Cloth Surgical T ape, 4 x 10 (in/yd) Discharge Instruction: Secure with tape as directed. Compression Wrap Compression Stockings Add-Ons Electronic Signature(s) Signed: 03/12/2023 4:34:06 PM By: Redmond Pulling RN, BSN Entered By: Redmond Pulling on 03/12/2023 11:02:47 -------------------------------------------------------------------------------- Vitals Details Patient Name: Date of Service: [name]. 03/12/2023 10:15 A M Medical Record Number: [id number] Patient Account Number: [id number] Date of Birth/Sex: Treating RN: [birth year] (72 y.o. [name] Primary Care Latoyna Hird: Caffie Damme Other Clinician: Referring Sanaiya Welliver: Treating Micaela Stith/Extender: Murvin Natal Weeks in Treatment: 32 Vital Signs Time Taken: 10:56 Temperature (F): 98.0 Height (in): 77 Pulse (bpm): 76 Weight (lbs): 318 Respiratory Rate (breaths/min): 20 Body Mass Index (BMI): 37.7 Blood Pressure (mmHg): 108/74 Capillary Blood Glucose (mg/dl): 440 Reference  Range: 80 - 120 mg / dl Electronic Signature(s) Signed: 03/12/2023 4:34:06 PM By: Redmond Pulling RN, BSN Entered By: Redmond Pulling on 03/12/2023 10:56:58

## 2023-03-24 ENCOUNTER — Encounter: Payer: Self-pay | Admitting: Student

## 2023-03-24 ENCOUNTER — Ambulatory Visit: Payer: Medicare Other | Attending: Student | Admitting: Student

## 2023-03-24 VITALS — BP 103/53 | HR 87 | Ht 77.0 in | Wt 309.0 lb

## 2023-03-24 DIAGNOSIS — R600 Localized edema: Secondary | ICD-10-CM | POA: Diagnosis not present

## 2023-03-24 DIAGNOSIS — I4821 Permanent atrial fibrillation: Secondary | ICD-10-CM

## 2023-03-24 DIAGNOSIS — I5032 Chronic diastolic (congestive) heart failure: Secondary | ICD-10-CM

## 2023-03-24 DIAGNOSIS — I739 Peripheral vascular disease, unspecified: Secondary | ICD-10-CM

## 2023-03-24 DIAGNOSIS — E118 Type 2 diabetes mellitus with unspecified complications: Secondary | ICD-10-CM

## 2023-03-24 DIAGNOSIS — I10 Essential (primary) hypertension: Secondary | ICD-10-CM

## 2023-03-24 DIAGNOSIS — E785 Hyperlipidemia, unspecified: Secondary | ICD-10-CM

## 2023-03-24 MED ORDER — METOPROLOL SUCCINATE ER 50 MG PO TB24: 50.0000 mg | ORAL_TABLET | Freq: Every day | ORAL | 0 refills | Status: DC

## 2023-03-24 MED ORDER — DILTIAZEM HCL ER COATED BEADS 360 MG PO CP24: 360.0000 mg | ORAL_CAPSULE | Freq: Every day | ORAL | 0 refills | Status: DC

## 2023-03-24 MED ORDER — SPIRONOLACTONE 25 MG PO TABS: 25.0000 mg | ORAL_TABLET | Freq: Every day | ORAL | 0 refills | Status: DC

## 2023-03-24 MED ORDER — TORSEMIDE 60 MG PO TABS: 60.0000 mg | ORAL_TABLET | Freq: Two times a day (BID) | ORAL | 0 refills | Status: DC

## 2023-03-24 NOTE — Patient Instructions (Signed)
Medication Instructions:  No Changes *If you need a refill on your cardiac medications before your next appointment, please call your pharmacy*   Lab Work: BNP,BMET If you have labs (blood work) drawn today and your tests are completely normal, you will receive your results only by: MyChart Message (if you have MyChart) OR A paper copy in the mail If you have any lab test that is abnormal or we need to change your treatment, we will call you to review the results.   Testing/Procedures: No Testing   Follow-Up: At [facility], you and your health needs are our priority.  As part of our continuing mission to provide you with exceptional heart care, we have created designated Provider Care Teams.  These Care Teams include your primary Cardiologist (physician) and Advanced Practice Providers (APPs -  Physician Assistants and Nurse Practitioners) who all work together to provide you with the care you need, when you need it.  We recommend signing up for the patient portal called "MyChart".  Sign up information is provided on this After Visit Summary.  MyChart is used to connect with patients for Virtual Visits (Telemedicine).  Patients are able to view lab/test results, encounter notes, upcoming appointments, etc.  Non-urgent messages can be sent to your provider as well.   To learn more about what you can do with MyChart, go to [email or web address].    Your next appointment:   Keep Scheduled Appointment  Provider:   Chrystie Nose, MD

## 2023-03-25 LAB — BASIC METABOLIC PANEL
BUN/Creatinine Ratio: 28 — ABNORMAL HIGH (ref 10–24)
BUN: 40 mg/dL — ABNORMAL HIGH (ref 8–27)
CO2: 29 mmol/L (ref 20–29)
Calcium: 9.4 mg/dL (ref 8.6–10.2)
Chloride: 97 mmol/L (ref 96–106)
Creatinine, Ser: 1.45 mg/dL — ABNORMAL HIGH (ref 0.76–1.27)
Glucose: 83 mg/dL (ref 70–99)
Potassium: 4.2 mmol/L (ref 3.5–5.2)
Sodium: 142 mmol/L (ref 134–144)
eGFR: 52 mL/min/{1.73_m2} — ABNORMAL LOW (ref >59)

## 2023-03-25 LAB — BRAIN NATRIURETIC PEPTIDE: BNP: 19 pg/mL (ref 0.0–100.0)

## 2023-03-26 ENCOUNTER — Other Ambulatory Visit: Payer: Self-pay

## 2023-03-26 DIAGNOSIS — R600 Localized edema: Secondary | ICD-10-CM

## 2023-03-26 DIAGNOSIS — E118 Type 2 diabetes mellitus with unspecified complications: Secondary | ICD-10-CM

## 2023-03-26 DIAGNOSIS — I480 Paroxysmal atrial fibrillation: Secondary | ICD-10-CM

## 2023-03-26 DIAGNOSIS — I4821 Permanent atrial fibrillation: Secondary | ICD-10-CM

## 2023-03-26 DIAGNOSIS — I5032 Chronic diastolic (congestive) heart failure: Secondary | ICD-10-CM

## 2023-03-26 MED ORDER — TORSEMIDE 40 MG PO TABS: 40.0000 mg | ORAL_TABLET | Freq: Two times a day (BID) | ORAL | 6 refills | Status: DC

## 2023-03-31 ENCOUNTER — Telehealth: Payer: Self-pay | Admitting: Internal Medicine

## 2023-03-31 ENCOUNTER — Other Ambulatory Visit: Payer: Self-pay

## 2023-03-31 DIAGNOSIS — I5032 Chronic diastolic (congestive) heart failure: Secondary | ICD-10-CM

## 2023-03-31 DIAGNOSIS — E118 Type 2 diabetes mellitus with unspecified complications: Secondary | ICD-10-CM

## 2023-03-31 DIAGNOSIS — R600 Localized edema: Secondary | ICD-10-CM

## 2023-03-31 DIAGNOSIS — I480 Paroxysmal atrial fibrillation: Secondary | ICD-10-CM

## 2023-03-31 DIAGNOSIS — I4821 Permanent atrial fibrillation: Secondary | ICD-10-CM

## 2023-03-31 NOTE — Telephone Encounter (Signed)
Sister calling to get a copy of patients labs request. Please advise

## 2023-03-31 NOTE — Telephone Encounter (Signed)
Left message to return call 

## 2023-04-03 NOTE — Telephone Encounter (Signed)
Spoke with susan, she reports that she does not need anything today. The labs have been taken care of.

## 2023-04-06 ENCOUNTER — Encounter (HOSPITAL_BASED_OUTPATIENT_CLINIC_OR_DEPARTMENT_OTHER): Payer: Medicare Other | Attending: General Surgery | Admitting: General Surgery

## 2023-04-06 DIAGNOSIS — Z6837 Body mass index (BMI) 37.0-37.9, adult: Secondary | ICD-10-CM | POA: Diagnosis not present

## 2023-04-06 DIAGNOSIS — E11622 Type 2 diabetes mellitus with other skin ulcer: Secondary | ICD-10-CM | POA: Diagnosis not present

## 2023-04-06 DIAGNOSIS — G6289 Other specified polyneuropathies: Secondary | ICD-10-CM | POA: Insufficient documentation

## 2023-04-06 DIAGNOSIS — G8220 Paraplegia, unspecified: Secondary | ICD-10-CM | POA: Insufficient documentation

## 2023-04-06 DIAGNOSIS — L98499 Non-pressure chronic ulcer of skin of other sites with unspecified severity: Secondary | ICD-10-CM | POA: Insufficient documentation

## 2023-04-06 DIAGNOSIS — Z932 Ileostomy status: Secondary | ICD-10-CM | POA: Diagnosis not present

## 2023-04-06 DIAGNOSIS — Z7401 Bed confinement status: Secondary | ICD-10-CM | POA: Insufficient documentation

## 2023-04-06 DIAGNOSIS — Z9049 Acquired absence of other specified parts of digestive tract: Secondary | ICD-10-CM | POA: Diagnosis not present

## 2023-04-06 DIAGNOSIS — L89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, stage 3: Secondary | ICD-10-CM | POA: Insufficient documentation

## 2023-04-06 NOTE — Progress Notes (Signed)
Bruce, [name] ([id number]) [id number] Page 1 of 10 Visit Report for 04/06/2023 Chief Complaint Document Details Patient Name: Date of Service: [name] 04/06/2023 10:45 A M Medical Record Number: [id number] Patient Account Number: [id number] Date of Birth/Sex: Treating RN: 10/22/1950 (72 y.o. M) Primary Care Provider: Caffie Damme Other Clinician: Referring Provider: Treating Provider/Extender: Haze Rushing in Treatment: 22 Information Obtained from: Patient Chief Complaint Here for follow up abdominal wound present following laparotomy, colostomy. 03/19/2022: Here for pressure ulcer on left gluteus and ulcer adjacent to ileostomy Electronic Signature(s) Signed: 04/06/2023 11:27:21 AM By: Duanne Guess MD FACS Entered By: Duanne Guess on 04/06/2023 11:27:21 -------------------------------------------------------------------------------- Debridement Details Patient Name: Date of Service: [name]. 04/06/2023 10:45 A M Medical Record Number: [id number] Patient Account Number: [id number] Date of Birth/Sex: Treating RN: 28-Dec-1950 (72 y.o. Marlan Palau Primary Care Provider: Caffie Damme Other Clinician: Referring Provider: Treating Provider/Extender: Haze Rushing in Treatment: 54 Debridement Performed for Assessment: Wound #3 Left Gluteus Performed By: Physician Duanne Guess, MD The following information was scribed by: Samuella Bruin The information was scribed for: Duanne Guess Debridement Type: Debridement Level of Consciousness (Pre-procedure): Awake and Alert Pre-procedure Verification/Time Out Yes - 10:29 Taken: Start Time: 10:29 Pain Control: Lidocaine 4% Topical Solution Percent of Wound Bed Debrided: 70% T Area Debrided (cm): otal 53.85 Tissue and other material debrided: Non-Viable, Eschar, Slough, Subcutaneous, Slough Level: Skin/Subcutaneous  Tissue Debridement Description: Excisional Instrument: Curette Bleeding: Minimum Hemostasis Achieved: Pressure Response to Treatment: Procedure was tolerated well Level of Consciousness (Post- Awake and Alert procedure): Post Debridement Measurements of Total Wound Length: (cm) 7 Stage: Category/Stage III Width: (cm) 14 Depth: (cm) 0.1 Volume: (cm) 7.697 Character of Wound/Ulcer Post Debridement: Improved Bruce, [name] ([id number]) [id number] Page 2 of 10 Post Procedure Diagnosis Same as Pre-procedure Electronic Signature(s) Signed: 04/06/2023 12:19:03 PM By: Duanne Guess MD FACS Signed: 04/06/2023 3:57:27 PM By: Samuella Bruin Entered By: Samuella Bruin on 04/06/2023 10:34:09 -------------------------------------------------------------------------------- HPI Details Patient Name: Date of Service: [name]. 04/06/2023 10:45 A M Medical Record Number: [id number] Patient Account Number: [id number] Date of Birth/Sex: Treating RN: March 22, 1951 (72 y.o. M) Primary Care Provider: Caffie Damme Other Clinician: Referring Provider: Treating Provider/Extender: Haze Rushing in Treatment: 28 History of Present Illness HPI Description: On medihoney with border dressing due to stalling on collagen and concern ABD was pulling off skin. 09/11/14 Nearly healed, counseled given the thin attenuated scar skin I expect will have some recurrence wounds with slight trauma or maceration, however much improved today. cont ABD pads and medihoney. 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 He  resides with his sister and is on a regular hospital bed. The 2 of them reported that for about the last year, he has had ulceration adjacent to his ileostomy as well as on his left gluteus and upper posterior thigh. Most recent hemoglobin A1c was 6.3%. On his left gluteus, there is a stage II pressure ulcer with surrounding tissue maceration and excoriation. There is a little slough on the wound surface. Adjacent to his ileostomy, associated with his appliance, there is an ulcer that extends into the fat layer. It is clean without any slough  or necrotic tissue. 04/10/2022: The left gluteal ulcer has expanded and is quite a bit larger today. There is slough and eschar accumulation. The patient's sister reports that the ileostomy associated wound got better and so they did not go to see the outpatient ostomy nurse. They have not received the low-air-loss mattress. 05/01/2022: Apparently the low-air-loss mattress has been denied by his insurance, but they are willing to cover a gel mattress overlay. The ileostomy-area wound has contracted further, per the patient's sister; we are not managing this wound at this time. The left gluteal ulcer seems to be about the same size, but there is extensive slough on all of the open surfaces. His skin is extremely friable. 05/22/2022: The gel mattress overlay was finally delivered last week. The left gluteal ulcer has deteriorated somewhat. There is slough on all of the open surfaces and the total area of the wound is larger. It has a funky odor to it today. 06/13/2022: The gluteal wound is smaller and has substantially less slough. Epithelium is filling in around the margins. The odor that was present at his last visit has dissipated. 07/03/2022: The gluteal wound is a little bit smaller again today. It seems more superficial and just has a light layer of slough on the surface. 08/07/2022: No significant change to the gluteal wound. The patient's sister does report that  it has bled less and seems less friable. 09/05/2022: The gluteal wound looks a little bit better, with smaller dimensions and less friable tissue. He has had some breakdown on his perineum, adjacent to his scrotum. It appears similar in nature to the gluteal wound. 10/03/2022: The wounds all look a little bit more superficial today. They are less friable. He does have a lot of scaly dry skin around each of the wound areas. There is slough on the wound surfaces. 11/11/2022: The wound dimensions are basically unchanged. The perineal ulcer looks a little bit deeper. He has scaly shaggy skin hanging from all of the wound surfaces along with some slough. 12/11/2022: All of the open areas have converged and appear deeper today. There is slough on the surfaces along with some hypertrophic granulation tissue. 01/08/2023: There has been some epithelialization between the wound sites so we are back to a patchwork of open areas. There is some slough accumulation on the surfaces with a little bit of eschar. No malodor or purulent drainage. 02/05/2023: The wound area measured larger today. The patient's sister thinks that the Medihoney is keeping things too wet and contributed to some moisture- related breakdown. There is slough on all of the open wound surfaces. 03/12/2023: The patient was hospitalized about a week ago for hypoxemia and CHF. The wounds have enlarged somewhat since I last saw them. There is slough accumulation of all of the surfaces. 04/06/2023: The wound actually looks fairly good today. There is a fair amount of crusted eschar and slough accumulation, but the overall surface area appears smaller. DEMETERIUS, KREISMAN ([id number]) [id number] Page 3 of 10 Electronic Signature(s) Signed: 04/06/2023 11:27:59 AM By: Duanne Guess MD FACS Entered By: Duanne Guess on 04/06/2023 11:27:58 -------------------------------------------------------------------------------- Physical  Exam Details Patient Name: Date of Service: [name] 04/06/2023 10:45 A M Medical Record Number: [id number] Patient Account Number: [id number] Date of Birth/Sex: Treating RN: 1950/07/08 (72 y.o. M) Primary Care Provider: Caffie Damme Other Clinician: Referring Provider: Treating Provider/Extender: Murvin Natal Weeks in Treatment: 54 Constitutional . . . no acute distress. Respiratory Normal work of breathing on supplemental oxygen.  Notes 04/06/2023: The wound actually looks fairly good today. There is a fair amount of crusted eschar and slough accumulation, but the overall surface area appears smaller. Electronic Signature(s) Signed: 04/06/2023 11:29:49 AM By: Duanne Guess MD FACS Previous Signature: 04/06/2023 11:29:33 AM Version By: Duanne Guess MD FACS Entered By: Duanne Guess on 04/06/2023 11:29:49 -------------------------------------------------------------------------------- Physician Orders Details Patient Name: Date of Service: [name]. 04/06/2023 10:45 A M Medical Record Number: [id number] Patient Account Number: [id number] Date of Birth/Sex: Treating RN: Sep 11, 1950 (72 y.o. Marlan Palau Primary Care Provider: Caffie Damme Other Clinician: Referring Provider: Treating Provider/Extender: Haze Rushing in Treatment: 28 The following information was scribed by: Samuella Bruin The information was scribed for: Duanne Guess Verbal / Phone Orders: No Diagnosis Coding ICD-10 Coding Code Description [phone] Pressure ulcer of left buttock, 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 E66.01 Morbid (severe) obesity due to excess calories G62.89 Other specified polyneuropathies Z93.2 Ileostomy status Follow-up Appointments ppointment in: - 6 WEEKS - *******STRETCHER********* Dr. Lady Gary Return A Anesthetic Wound #3 Left Gluteus (In clinic) Topical Lidocaine 4% applied  to wound bed DURIEL, STRANGER ([id number]) [id number] Page 4 of 10 Bathing/ Shower/ Hygiene May shower and wash wound with soap and water. Off-Loading Wound #3 Left Gluteus Gel mattress overlay (Group 1) Turn and reposition every 2 hours Home Health Admit to Home Health for skilled nursing wound care. May utilize formulary equivalent dressing for wound treatment orders unless otherwise specified. New wound care orders this week; continue Home Health for wound care. May utilize formulary equivalent dressing for wound treatment orders unless otherwise specified. Dressing changes to be completed by Home Health on Monday / Wednesday / Friday except when patient has scheduled visit at [facility]. Other Home Health Orders/Instructions: - [facility] Wound Treatment Wound #3 - Gluteus Wound Laterality: Left Cleanser: Soap and Water 1 x Per Day/30 Days Discharge Instructions: May shower and wash wound with dial antibacterial soap and water prior to dressing change. Cleanser: Wound Cleanser (Generic) 1 x Per Day/30 Days Discharge Instructions: Cleanse the wound with wound cleanser prior to applying a clean dressing using gauze sponges, not tissue or cotton balls. Topical: Gentamicin 1 x Per Day/30 Days Discharge Instructions: As directed by physician Topical: Mupirocin Ointment 1 x Per Day/30 Days Discharge Instructions: Apply Mupirocin (Bactroban) as instructed Topical: Ketoconazole Cream 2% 1 x Per Day/30 Days Discharge Instructions: Apply Ketoconazole as directed Prim Dressing: Maxorb Extra Ag+ Alginate Dressing, 4x4.75 (in/in) (Generic) 1 x Per Day/30 Days ary Discharge Instructions: Apply to wound bed as instructed Secondary Dressing: ABD Pad, 8x10 (Generic) 1 x Per Day/30 Days Discharge Instructions: Apply over primary dressing as directed. Secured With: 36M Medipore H Soft Cloth Surgical T ape, 4 x 10 (in/yd) (Generic) 1 x Per Day/30 Days Discharge  Instructions: Secure with tape as directed. Patient Medications llergies: penicillin A Notifications Medication Indication Start End 04/06/2023 lidocaine DOSE topical 4 % cream - cream topical Electronic Signature(s) Signed: 04/06/2023 12:19:03 PM By: Duanne Guess MD FACS Entered By: Duanne Guess on 04/06/2023 11:30:49 -------------------------------------------------------------------------------- Problem List Details Patient Name: Date of Service: [name]. 04/06/2023 10:45 A M Medical Record Number: [id number] Patient Account Number: [id number] Date of Birth/Sex: Treating RN: 05/07/1951 (72 y.o. M) Primary Care Provider: Caffie Damme Other Clinician: Referring Provider: Treating Provider/Extender: Haze Rushing in Treatment: [address] DYSHAUN, OUDERKIRK ([id number]) [id number] Page 5 of 10 ICD-10 Encounter Code Description Active  Date MDM Diagnosis L89.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, stage 3 03/19/2022 No Yes E11.622 Type 2 diabetes mellitus with other skin ulcer 03/19/2022 No Yes E66.01 Morbid (severe) obesity due to excess calories 03/19/2022 No Yes G62.89 Other specified polyneuropathies 03/19/2022 No Yes Z93.2 Ileostomy status 03/19/2022 No Yes Inactive Problems ICD-10 Code Description Active Date Inactive Date L98.492 Non-pressure chronic ulcer of skin of other sites with fat layer exposed 03/19/2022 03/19/2022 Resolved Problems Electronic Signature(s) Signed: 04/06/2023 11:27:05 AM By: Duanne Guess MD FACS Entered By: Duanne Guess on 04/06/2023 11:27:05 -------------------------------------------------------------------------------- Progress Note Details Patient Name: Date of Service: [name]. 04/06/2023 10:45 A M Medical Record Number: [id number] Patient Account Number: [id number] Date of Birth/Sex: Treating RN: 02-23-1951 (72 y.o. M) Primary Care Provider: Caffie Damme Other  Clinician: Referring Provider: Treating Provider/Extender: Haze Rushing in Treatment: 73 Subjective Chief Complaint Information obtained from Patient Here for follow up abdominal wound present following laparotomy, colostomy. 03/19/2022: Here for pressure ulcer on left gluteus and ulcer adjacent to ileostomy History of Present Illness (HPI) On medihoney with border dressing due to stalling on collagen and concern ABD was pulling off skin. 09/11/14 Nearly healed, counseled given the thin attenuated scar skin I expect will have some recurrence wounds with slight trauma or maceration, however much improved today. cont ABD pads and medihoney. 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 He resides with his sister and is on a regular hospital bed. The 2 of them reported that for about the last year, he has had ulceration adjacent to his ileostomy as well as on his left gluteus and upper posterior thigh. Most recent hemoglobin A1c was 6.3%. On his left gluteus, there is a stage II pressure ulcer with surrounding tissue maceration and excoriation. There is a little slough on the wound surface. Adjacent LEIDEN, PURPLE ([id number]) [id number] Page 6 of 10 to his ileostomy, associated with his appliance, there is an ulcer that extends into the fat layer. It is clean without any slough or necrotic tissue. 04/10/2022: The left gluteal ulcer has expanded and is quite a bit larger today. There is slough and eschar accumulation. The patient's sister reports that the ileostomy associated wound got better and so they did not go  to see the outpatient ostomy nurse. They have not received the low-air-loss mattress. 05/01/2022: Apparently the low-air-loss mattress has been denied by his insurance, but they are willing to cover a gel mattress overlay. The ileostomy-area wound has contracted further, per the patient's sister; we are not managing this wound at this time. The left gluteal ulcer seems to be about the same size, but there is extensive slough on all of the open surfaces. His skin is extremely friable. 05/22/2022: The gel mattress overlay was finally delivered last week. The left gluteal ulcer has deteriorated somewhat. There is slough on all of the open surfaces and the total area of the wound is larger. It has a funky odor to it today. 06/13/2022: The gluteal wound is smaller and has substantially less slough. Epithelium is filling in around the margins. The odor that was present at his last visit has dissipated. 07/03/2022: The gluteal wound is a little bit smaller again today. It seems more superficial and  just has a light layer of slough on the surface. 08/07/2022: No significant change to the gluteal wound. The patient's sister does report that it has bled less and seems less friable. 09/05/2022: The gluteal wound looks a little bit better, with smaller dimensions and less friable tissue. He has had some breakdown on his perineum, adjacent to his scrotum. It appears similar in nature to the gluteal wound. 10/03/2022: The wounds all look a little bit more superficial today. They are less friable. He does have a lot of scaly dry skin around each of the wound areas. There is slough on the wound surfaces. 11/11/2022: The wound dimensions are basically unchanged. The perineal ulcer looks a little bit deeper. He has scaly shaggy skin hanging from all of the wound surfaces along with some slough. 12/11/2022: All of the open areas have converged and appear deeper today. There is slough on the surfaces along with some hypertrophic  granulation tissue. 01/08/2023: There has been some epithelialization between the wound sites so we are back to a patchwork of open areas. There is some slough accumulation on the surfaces with a little bit of eschar. No malodor or purulent drainage. 02/05/2023: The wound area measured larger today. The patient's sister thinks that the Medihoney is keeping things too wet and contributed to some moisture- related breakdown. There is slough on all of the open wound surfaces. 03/12/2023: The patient was hospitalized about a week ago for hypoxemia and CHF. The wounds have enlarged somewhat since I last saw them. There is slough accumulation of all of the surfaces. 04/06/2023: The wound actually looks fairly good today. There is a fair amount of crusted eschar and slough accumulation, but the overall surface area appears smaller. Patient History Information obtained from Patient. Family History Cancer - Father,Mother, Diabetes - Father,Mother,Siblings, Heart Disease - Mother,Siblings, Hypertension - Father,Siblings, Kidney Disease - Siblings. Social History Former smoker - ended on 08/10/2013, Marital Status - Single, Alcohol Use - Rarely, Drug Use - No History, Caffeine Use - Rarely. Medical History Constitutional Symptoms (General Health) Patient has history of Congestive Heart Failure Eyes Denies history of Cataracts, Glaucoma, Optic Neuritis Ear/Nose/Mouth/Throat Denies history of Chronic sinus problems/congestion, Middle ear problems Cardiovascular Patient has history of Hypertension Endocrine Patient has history of Type I Diabetes Denies history of Type 1 Diabetes Genitourinary Denies history of End Stage Renal Disease Neurologic Patient has history of Neuropathy, Paraplegia Psychiatric Denies history of Anorexia/bulimia, Confinement Anxiety Medical A Surgical History Notes nd Cardiovascular atrial fibrillation Musculoskeletal arthritis Objective Constitutional no acute  distress. CLARENCE, HAISLEY ([id number]) [id number] Page 7 of 10 Vitals Time Taken: 10:18 AM, Height: 77 in, Weight: 318 lbs, BMI: 37.7, Temperature: 97.7 F, Pulse: 92 bpm, Respiratory Rate: 20 breaths/min, Blood Pressure: 130/71 mmHg, Capillary Blood Glucose: 177 mg/dl. Respiratory Normal work of breathing on supplemental oxygen. General Notes: 04/06/2023: The wound actually looks fairly good today. There is a fair amount of crusted eschar and slough accumulation, but the overall surface area appears smaller. Integumentary (Hair, Skin) Wound #3 status is Open. Original cause of wound was Gradually Appeared. The date acquired was: 05/14/2021. The wound has been in treatment 54 weeks. The wound is located on the Left Gluteus. The wound measures 7cm length x 14cm width x 0.1cm depth; 76.969cm^2 area and 7.697cm^3 volume. There is Fat Layer (Subcutaneous Tissue) exposed. There is no tunneling or undermining noted. There is a medium amount of serosanguineous drainage noted. The wound margin is distinct with the outline attached to the wound base.  There is medium (34-66%) red, friable, hyper - granulation within the wound bed. There is a medium (34-66%) amount of necrotic tissue within the wound bed including Adherent Slough. The periwound skin appearance had no abnormalities noted for color. The periwound skin appearance exhibited: Scarring, Dry/Scaly. The periwound skin appearance did not exhibit: Callus, Crepitus, Excoriation, Induration, Rash, Maceration. Periwound temperature was noted as No Abnormality. The periwound has tenderness on palpation. 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  There was a Excisional Skin/Subcutaneous Tissue Debridement with a total area of 53.85 sq cm performed by Duanne Guess, MD. With the following instrument(s): Curette to remove Non-Viable tissue/material. Material removed includes Eschar, Subcutaneous Tissue, and Slough after achieving pain control using Lidocaine 4% T opical Solution. No specimens were taken. A time out was conducted at 10:29, prior to the start of the procedure. A Minimum amount of bleeding was controlled with Pressure. The procedure was tolerated well. Post Debridement Measurements: 7cm length x 14cm width x 0.1cm depth; 7.697cm^3 volume. Post debridement Stage noted as Category/Stage III. Character of Wound/Ulcer Post Debridement is improved. Post procedure Diagnosis Wound #3: Same as Pre-Procedure Plan Follow-up Appointments: Return Appointment in: - 6 WEEKS - *******STRETCHER********* Dr. Lady Gary Anesthetic: Wound #3 Left Gluteus: (In clinic) Topical Lidocaine 4% applied to wound bed Bathing/ Shower/ Hygiene: May shower and wash wound with soap and water. Off-Loading: Wound #3 Left Gluteus: Gel mattress overlay (Group 1) Turn and reposition every 2 hours Home Health: Admit to Home Health for skilled nursing wound care. May utilize formulary equivalent dressing for wound treatment orders unless otherwise specified. New wound care orders this week; continue Home Health for wound care. May utilize formulary equivalent dressing for wound treatment orders unless otherwise specified. Dressing changes to be completed by Home Health on Monday / Wednesday / Friday except when patient has scheduled visit at [facility]. Other Home Health Orders/Instructions: - [facility] The following medication(s) was prescribed: lidocaine topical 4 % cream cream topical was prescribed at facility WOUND #3: - Gluteus Wound Laterality: Left Cleanser: Soap and Water 1 x Per Day/30 Days Discharge Instructions: May shower and wash wound  with dial antibacterial soap and water prior to dressing change. Cleanser: Wound Cleanser (Generic) 1 x Per Day/30 Days Discharge Instructions: Cleanse the wound with wound cleanser prior to applying a clean dressing using gauze sponges, not tissue or cotton balls. Topical: Gentamicin 1 x Per Day/30 Days Discharge Instructions: As directed by physician Topical: Mupirocin Ointment 1 x Per Day/30 Days Discharge Instructions: Apply Mupirocin (Bactroban) as instructed Topical: Ketoconazole Cream 2% 1 x Per Day/30 Days Discharge Instructions: Apply Ketoconazole as directed ZAREN, DUSCH ([id number]) [id number] Page 8 of 10 Prim Dressing: Maxorb Extra Ag+ Alginate Dressing, 4x4.75 (in/in) (Generic) 1 x Per Day/30 Days ary Discharge Instructions: Apply to wound bed as instructed Secondary Dressing: ABD Pad, 8x10 (Generic) 1 x Per Day/30 Days Discharge Instructions: Apply over primary dressing as directed. Secured With: 18M Medipore H Soft Cloth Surgical T ape, 4 x 10 (in/yd) (Generic) 1 x Per Day/30 Days Discharge Instructions: Secure with tape as directed. 04/06/2023: The wound actually looks fairly good today. There is a fair amount of crusted eschar and slough accumulation, but the overall surface area appears smaller. I used a  curette to debride slough, eschar, and subcutaneous tissue from his wounds. We will continue the mixture of topical gentamicin and mupirocin with silver alginate. His sister expressed concern that every time he transfers, in terms of being transported to clinic, she notices some shear injury occur and she is wondering if we can spaces visits out a little bit more and I think that is a reasonable request. He will follow-up in 6 weeks. Electronic Signature(s) Signed: 04/06/2023 11:32:18 AM By: Duanne Guess MD FACS Entered By: Duanne Guess on 04/06/2023  11:32:17 -------------------------------------------------------------------------------- HxROS Details Patient Name: Date of Service: [name]. 04/06/2023 10:45 A M Medical Record Number: [id number] Patient Account Number: [id number] Date of Birth/Sex: Treating RN: 09-11-1950 (72 y.o. M) Primary Care Provider: Caffie Damme Other Clinician: Referring Provider: Treating Provider/Extender: Haze Rushing in Treatment: 55 Information Obtained From Patient Constitutional Symptoms (General Health) Medical History: Positive for: Congestive Heart Failure Eyes Medical History: Negative for: Cataracts; Glaucoma; Optic Neuritis Ear/Nose/Mouth/Throat Medical History: Negative for: Chronic sinus problems/congestion; Middle ear problems Cardiovascular Medical History: Positive for: Hypertension Past Medical History Notes: atrial fibrillation Endocrine Medical History: Positive for: Type I Diabetes Negative for: Type 1 Diabetes Time with diabetes: less then 10 Treated with: Oral agents Genitourinary Medical History: Negative for: End Stage Renal Disease YAIDEN, FELBER ([id number]) [id number] Page 9 of 10 Musculoskeletal Medical History: Past Medical History Notes: arthritis Neurologic Medical History: Positive for: Neuropathy; Paraplegia Psychiatric Medical History: Negative for: Anorexia/bulimia; Confinement Anxiety Immunizations Pneumococcal Vaccine: Received Pneumococcal Vaccination: Yes Received Pneumococcal Vaccination On or After 60th Birthday: Yes Implantable Devices None Family and Social History Cancer: Yes - Father,Mother; Diabetes: Yes - Father,Mother,Siblings; Heart Disease: Yes - Mother,Siblings; Hypertension: Yes - Father,Siblings; Kidney Disease: Yes - Siblings; Former smoker - ended on 08/10/2013; Marital Status - Single; Alcohol Use: Rarely; Drug Use: No History; Caffeine Use: Rarely; Financial  Concerns: No; Food, Clothing or Shelter Needs: No; Support System Lacking: No; Transportation Concerns: No Electronic Signature(s) Signed: 04/06/2023 12:19:03 PM By: Duanne Guess MD FACS Entered By: Duanne Guess on 04/06/2023 11:28:05 -------------------------------------------------------------------------------- SuperBill Details Patient Name: Date of Service: [name] 04/06/2023 Medical Record Number: [id number] Patient Account Number: [id number] Date of Birth/Sex: Treating RN: [birth year] (72 y.o. 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 Facility Procedures : CPT4 Code: [id number] Description: 11042 - DEB SUBQ TISSUE 20 SQ CM/< ICD-10 Diagnosis Description L89.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, stage 3 Modifier: Quantity: 1 Physician Procedures : CPT4 Code Description Modifier [id number] 99214 - WC PHYS LEVEL 4 - EST PT 25 ICD-10 Diagnosis Description L89.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, 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

## 2023-04-06 NOTE — Progress Notes (Signed)
RAE, PIVIROTTO ([id number]) [id number] Page 1 of 8 Visit Report for 04/06/2023 Arrival Information Details Patient Name: Date of Service: [name] 04/06/2023 10:45 A M Medical Record Number: [id number] Patient Account Number: [id number] Date of Birth/Sex: Treating RN: 08-27-1950 (72 y.o. Marlan Palau Primary Care Saajan Willmon: Caffie Damme Other Clinician: Referring Belvia Gotschall: Treating Vitaliy Eisenhour/Extender: Haze Rushing in Treatment: 39 Visit Information History Since Last Visit Added or deleted any medications: No Patient Arrived: Stretcher Any new allergies or adverse reactions: No Arrival Time: 10:17 Had a fall or experienced change in No Accompanied By: sister activities of daily living that may affect Transfer Assistance: Stretcher risk of falls: Patient Identification Verified: Yes Signs or symptoms of abuse/neglect since last visito No Secondary Verification Process Completed: Yes Hospitalized since last visit: No Patient Requires Transmission-Based Precautions: No Implantable device outside of the clinic excluding No Patient Has Alerts: No cellular tissue based products placed in the center since last visit: Has Dressing in Place as Prescribed: Yes Pain Present Now: Yes Electronic Signature(s) Signed: 04/06/2023 3:57:27 PM By: Samuella Bruin Entered By: Samuella Bruin on 04/06/2023 10:17:27 -------------------------------------------------------------------------------- Encounter Discharge Information Details Patient Name: Date of Service: [name]. 04/06/2023 10:45 A M Medical Record Number: [id number] Patient Account Number: [id number] Date of Birth/Sex: Treating RN: [birth year] (72 y.o. Marlan Palau Primary Care Ka Bench: Caffie Damme Other Clinician: Referring Christian Treadway: Treating Tashawnda Bleiler/Extender: Haze Rushing in Treatment: 64 Encounter Discharge  Information Items Post Procedure Vitals Discharge Condition: Stable Temperature (F): 97.7 Ambulatory Status: Stretcher Pulse (bpm): 92 Discharge Destination: Home Respiratory Rate (breaths/min): 20 Transportation: Ambulance Blood Pressure (mmHg): 130/71 Accompanied By: sister Schedule Follow-up Appointment: Yes Clinical Summary of Care: Patient Declined Electronic Signature(s) Signed: 04/06/2023 3:57:27 PM By: Samuella Bruin Entered By: Samuella Bruin on 04/06/2023 10:50:13 [name] ([id number]) [id number] Page 2 of 8 -------------------------------------------------------------------------------- Lower Extremity Assessment Details Patient Name: Date of Service: [name] 04/06/2023 10:45 A M Medical Record Number: [id number] Patient Account Number: [id number] Date of Birth/Sex: Treating RN: [birth year] (72 y.o. Marlan Palau Primary Care Antony Sian: Caffie Damme Other Clinician: Referring Carrisa Keller: Treating Madeeha Costantino/Extender: Murvin Natal Weeks in Treatment: 20 Electronic Signature(s) Signed: 04/06/2023 3:57:27 PM By: Samuella Bruin Entered By: Samuella Bruin on 04/06/2023 10:17:32 -------------------------------------------------------------------------------- Multi Wound Chart Details Patient Name: Date of Service: [name]. 04/06/2023 10:45 A M Medical Record Number: [id number] Patient Account Number: [id number] Date of Birth/Sex: Treating RN: [birth year] (72 y.o. M) Primary Care Joesphine Schemm: Caffie Damme Other Clinician: Referring Rayvin Abid: Treating Derryck Shahan/Extender: Murvin Natal Weeks in Treatment: 38 Vital Signs Height(in): 77 Capillary Blood Glucose(mg/dl): 643 Weight(lbs): 329 Pulse(bpm): 92 Body Mass Index(BMI): 37.7 Blood Pressure(mmHg): 130/71 Temperature(F): 97.7 Respiratory Rate(breaths/min): 20 [3:Photos:] [N/A:N/A] Left Gluteus N/A N/A Wound  Location: Gradually Appeared N/A N/A Wounding Event: Pressure Ulcer N/A N/A Primary Etiology: Congestive Heart Failure, N/A N/A Comorbid History: Hypertension, Type I Diabetes, Neuropathy, Paraplegia 05/14/2021 N/A N/A Date Acquired: 46 N/A N/A Weeks of Treatment: Open N/A N/A Wound Status: No N/A N/A Wound Recurrence: 7x14x0.1 N/A N/A Measurements L x W x D (cm) 76.969 N/A N/A A (cm) : rea 7.697 N/A N/A Volume (cm) : 53.90% N/A N/A % Reduction in A rea: 53.90% N/A N/A % Reduction in Volume: Category/Stage III N/A N/A Classification: Medium N/A N/A Exudate A mount: Serosanguineous N/A N/A Exudate Type: red, brown N/A N/A Exudate Color: Distinct, outline attached N/A N/A Wound Margin: Medium (34-66%) N/A  N/A Granulation A mount: Red, Hyper-granulation, Friable N/A N/A Granulation QualityJUSTIN, ALCANTAR ([id number]) [id number] Page 3 of 8 Medium (34-66%) N/A N/A Necrotic Amount: Fat Layer (Subcutaneous Tissue): Yes N/A N/A Exposed Structures: Fascia: No Tendon: No Muscle: No Joint: No Bone: No Small (1-33%) N/A N/A Epithelialization: Debridement - Excisional N/A N/A Debridement: Pre-procedure Verification/Time Out 10:29 N/A N/A Taken: Lidocaine 4% Topical Solution N/A N/A Pain Control: Necrotic/Eschar, Subcutaneous, N/A N/A Tissue Debrided: Slough Skin/Subcutaneous Tissue N/A N/A Level: 53.85 N/A N/A Debridement A (sq cm): rea Curette N/A N/A Instrument: Minimum N/A N/A Bleeding: Pressure N/A N/A Hemostasis Achieved: Debridement Treatment Response: Procedure was tolerated well N/A N/A Post Debridement Measurements L x 7x14x0.1 N/A N/A W x D (cm) 7.697 N/A N/A Post Debridement Volume: (cm) Category/Stage III N/A N/A Post Debridement Stage: Scarring: Yes N/A N/A Periwound Skin Texture: Excoriation: No Induration: No Callus: No Crepitus: No Rash: No Dry/Scaly: Yes N/A N/A Periwound Skin  Moisture: Maceration: No Atrophie Blanche: No N/A N/A Periwound Skin Color: Cyanosis: No Ecchymosis: No Erythema: No Hemosiderin Staining: No Mottled: No Pallor: No Rubor: No No Abnormality N/A N/A Temperature: Yes N/A N/A Tenderness on Palpation: Debridement N/A N/A Procedures Performed: Treatment Notes Wound #3 (Gluteus) Wound Laterality: Left Cleanser Soap and Water Discharge Instruction: May shower and wash wound with dial antibacterial soap and water prior to dressing change. Wound Cleanser Discharge Instruction: Cleanse the wound with wound cleanser prior to applying a clean dressing using gauze sponges, not tissue or cotton balls. Peri-Wound Care Topical Gentamicin Discharge Instruction: As directed by physician Mupirocin Ointment Discharge Instruction: Apply Mupirocin (Bactroban) as instructed Ketoconazole Cream 2% Discharge Instruction: Apply Ketoconazole as directed Primary Dressing Maxorb Extra Ag+ Alginate Dressing, 4x4.75 (in/in) Discharge Instruction: Apply to wound bed as instructed Secondary Dressing ABD Pad, 8x10 Discharge Instruction: Apply over primary dressing as directed. Secured With 62M Medipore H Soft Cloth Surgical T ape, 4 x 10 (in/yd) Discharge Instruction: Secure with tape as directed. Compression Wrap Compression Stockings Add-Ons RITHVIK, WALWORTH ([id number]) [id number] Page 4 of 8 Electronic Signature(s) Signed: 04/06/2023 11:27:12 AM By: Duanne Guess MD FACS Entered By: Duanne Guess on 04/06/2023 11:27:12 -------------------------------------------------------------------------------- Multi-Disciplinary Care Plan Details Patient Name: Date of Service: [name]. 04/06/2023 10:45 A M Medical Record Number: [id number] Patient Account Number: [id number] Date of Birth/Sex: Treating RN: [birth year] (72 y.o. Marlan Palau Primary Care Corryn Madewell: Caffie Damme Other Clinician: Referring  Tisheena Maguire: Treating Jdyn Parkerson/Extender: Haze Rushing in Treatment: 48 Multidisciplinary Care Plan reviewed with physician Active Inactive Nutrition Nursing Diagnoses: Potential for alteratiion in Nutrition/Potential for imbalanced nutrition Goals: Patient/caregiver verbalizes understanding of need to maintain therapeutic glucose control per primary care physician Date Initiated: 03/19/2022 Target Resolution Date: 05/08/2023 Goal Status: Active Interventions: Provide education on elevated blood sugars and impact on wound healing Treatment Activities: Dietary management education, guidance and counseling : 03/19/2022 Notes: Wound/Skin Impairment Nursing Diagnoses: Knowledge deficit related to ulceration/compromised skin integrity Goals: Ulcer/skin breakdown will have a volume reduction of 30% by week 4 Date Initiated: 03/19/2022 Target Resolution Date: 05/08/2023 Goal Status: Active Interventions: Assess ulceration(s) every visit Provide education on ulcer and skin care Treatment Activities: Skin care regimen initiated : 03/19/2022 Notes: Electronic Signature(s) Signed: 04/06/2023 3:57:27 PM By: Samuella Bruin Entered By: Samuella Bruin on 04/06/2023 10:28:26 [name] ([id number]) [id number] Page 5 of 8 -------------------------------------------------------------------------------- Pain Assessment Details Patient Name: Date of Service: [name] 04/06/2023 10:45 A M Medical Record Number: [id number] Patient Account Number: [id number]  Date of Birth/Sex: Treating RN: Oct 27, 1950 (72 y.o. Marlan Palau Primary Care Jamason Peckham: Caffie Damme Other Clinician: Referring Syanna Remmert: Treating Kina Shiffman/Extender: Haze Rushing in Treatment: 31 Active Problems Location of Pain Severity and Description of Pain Patient Has Paino Yes Site Locations Pain Location: Pain in Ulcers Duration of  the Pain. Constant / Intermittento Constant Rate the pain. Current Pain Level: 10 Character of Pain Describe the Pain: Sharp, Stabbing Pain Management and Medication Current Pain Management: Medication: Yes Electronic Signature(s) Signed: 04/06/2023 3:57:27 PM By: Samuella Bruin Entered By: Samuella Bruin on 04/06/2023 10:18:07 -------------------------------------------------------------------------------- Patient/Caregiver Education Details Patient Name: Date of Service: [name] 11/25/2024andnbsp10:45 A M Medical Record Number: [id number] Patient Account Number: [id number] Date of Birth/Gender: Treating RN: [birth year] (72 y.o. Marlan Palau Primary Care Physician: Caffie Damme Other Clinician: Referring Physician: Treating Physician/Extender: Haze Rushing in Treatment: 69 Education Assessment Education Provided To: Patient Education Topics Provided Wound/Skin ImpairmentDHAVAL, RADCLIFF ([id number]) [id number] Page 6 of 8 Methods: Explain/Verbal Responses: Reinforcements needed, State content correctly Electronic Signature(s) Signed: 04/06/2023 3:57:27 PM By: Samuella Bruin Entered By: Samuella Bruin on 04/06/2023 10:28:39 -------------------------------------------------------------------------------- Wound Assessment Details Patient Name: Date of Service: [name]. 04/06/2023 10:45 A M Medical Record Number: [id number] Patient Account Number: [id number] Date of Birth/Sex: Treating RN: 07-20-1950 (72 y.o. Marlan Palau Primary Care Zuri Lascala: Caffie Damme Other Clinician: Referring Lareen Mullings: Treating Lakhia Gengler/Extender: Murvin Natal Weeks in Treatment: 54 Wound Status Wound Number: 3 Primary Pressure Ulcer Etiology: Wound Location: Left Gluteus Wound Open Wounding Event: Gradually Appeared Status: Date Acquired: 05/14/2021 Comorbid Congestive Heart  Failure, Hypertension, Type I Diabetes, Weeks Of Treatment: 54 History: Neuropathy, Paraplegia Clustered Wound: No Photos Wound Measurements Length: (cm) 7 Width: (cm) 14 Depth: (cm) 0.1 Area: (cm) 76.969 Volume: (cm) 7.697 % Reduction in Area: 53.9% % Reduction in Volume: 53.9% Epithelialization: Small (1-33%) Tunneling: No Undermining: No Wound Description Classification: Category/Stage III Wound Margin: Distinct, outline attached Exudate Amount: Medium Exudate Type: Serosanguineous Exudate Color: red, brown Foul Odor After Cleansing: No Slough/Fibrino Yes Wound Bed Granulation Amount: Medium (34-66%) Exposed Structure Granulation Quality: Red, Hyper-granulation, Friable Fascia Exposed: No Necrotic Amount: Medium (34-66%) Fat Layer (Subcutaneous Tissue) Exposed: Yes Necrotic Quality: Adherent Slough Tendon Exposed: No Muscle Exposed: No Joint Exposed: No Bone Exposed: No Periwound Skin Texture Texture Color No Abnormalities Noted: No No Abnormalities NotedLENVIL, GABOUREL ([id number]) [id number] Page 7 of 8 Callus: No Temperature / Pain Crepitus: No Temperature: No Abnormality Excoriation: No Tenderness on Palpation: Yes Induration: No Rash: No Scarring: Yes Moisture No Abnormalities Noted: No Dry / Scaly: Yes Maceration: No Treatment Notes Wound #3 (Gluteus) Wound Laterality: Left Cleanser Soap and Water Discharge Instruction: May shower and wash wound with dial antibacterial soap and water prior to dressing change. Wound Cleanser Discharge Instruction: Cleanse the wound with wound cleanser prior to applying a clean dressing using gauze sponges, not tissue or cotton balls. Peri-Wound Care Topical Gentamicin Discharge Instruction: As directed by physician Mupirocin Ointment Discharge Instruction: Apply Mupirocin (Bactroban) as instructed Ketoconazole Cream 2% Discharge Instruction: Apply Ketoconazole as  directed Primary Dressing Maxorb Extra Ag+ Alginate Dressing, 4x4.75 (in/in) Discharge Instruction: Apply to wound bed as instructed Secondary Dressing ABD Pad, 8x10 Discharge Instruction: Apply over primary dressing as directed. Secured With 27M Medipore H Soft Cloth Surgical T ape, 4 x 10 (in/yd) Discharge Instruction: Secure with tape as directed. Compression Wrap Compression Stockings Add-Ons Electronic Signature(s) Signed: 04/06/2023 3:46:39 PM  By: Shawn Stall RN, BSN Signed: 04/06/2023 3:57:27 PM By: Gelene Mink By: Shawn Stall on 04/06/2023 10:24:22 -------------------------------------------------------------------------------- Vitals Details Patient Name: Date of Service: [name]. 04/06/2023 10:45 A M Medical Record Number: [id number] Patient Account Number: [id number] Date of Birth/Sex: Treating RN: 07-May-1951 (72 y.o. Marlan Palau Primary Care Burris Matherne: Caffie Damme Other Clinician: Referring Alpha Mysliwiec: Treating Mauria Asquith/Extender: Murvin Natal Weeks in Treatment: 24 Vital Signs Time Taken: 10:18 Temperature (F): 97.7 Height (in): 77 Pulse (bpm): [address] ([id number]) [id number] Page 8 of 8 Weight (lbs): 318 Respiratory Rate (breaths/min): 20 Body Mass Index (BMI): 37.7 Blood Pressure (mmHg): 130/71 Capillary Blood Glucose (mg/dl): 846 Reference Range: 80 - 120 mg / dl Electronic Signature(s) Signed: 04/06/2023 3:57:27 PM By: Samuella Bruin Entered By: Samuella Bruin on 04/06/2023 10:20:22

## 2023-04-09 LAB — BASIC METABOLIC PANEL
BUN/Creatinine Ratio: 25 — ABNORMAL HIGH (ref 10–24)
BUN: 19 mg/dL (ref 8–27)
CO2: 30 mmol/L — ABNORMAL HIGH (ref 20–29)
Calcium: 9.7 mg/dL (ref 8.6–10.2)
Chloride: 94 mmol/L — ABNORMAL LOW (ref 96–106)
Creatinine, Ser: 0.77 mg/dL (ref 0.76–1.27)
Glucose: 191 mg/dL — ABNORMAL HIGH (ref 70–99)
Potassium: 5.7 mmol/L — ABNORMAL HIGH (ref 3.5–5.2)
Sodium: 140 mmol/L (ref 134–144)
eGFR: 96 mL/min/{1.73_m2} (ref >59)

## 2023-04-10 ENCOUNTER — Other Ambulatory Visit: Payer: Self-pay

## 2023-04-10 ENCOUNTER — Encounter (HOSPITAL_COMMUNITY): Payer: Self-pay

## 2023-04-10 ENCOUNTER — Other Ambulatory Visit: Payer: Self-pay | Admitting: Neurology

## 2023-04-10 ENCOUNTER — Emergency Department (HOSPITAL_COMMUNITY)
Admission: EM | Admit: 2023-04-10 | Discharge: 2023-04-10 | Disposition: A | Discharge status: Home / Self Care | Payer: Medicare Other | Attending: Emergency Medicine | Admitting: Emergency Medicine

## 2023-04-10 DIAGNOSIS — E875 Hyperkalemia: Secondary | ICD-10-CM | POA: Insufficient documentation

## 2023-04-10 DIAGNOSIS — Z79899 Other long term (current) drug therapy: Secondary | ICD-10-CM | POA: Insufficient documentation

## 2023-04-10 DIAGNOSIS — I4891 Unspecified atrial fibrillation: Secondary | ICD-10-CM | POA: Insufficient documentation

## 2023-04-10 DIAGNOSIS — Z86718 Personal history of other venous thrombosis and embolism: Secondary | ICD-10-CM

## 2023-04-10 DIAGNOSIS — Z7984 Long term (current) use of oral hypoglycemic drugs: Secondary | ICD-10-CM | POA: Diagnosis not present

## 2023-04-10 DIAGNOSIS — R059 Cough, unspecified: Secondary | ICD-10-CM | POA: Insufficient documentation

## 2023-04-10 DIAGNOSIS — R198 Other specified symptoms and signs involving the digestive system and abdomen: Secondary | ICD-10-CM

## 2023-04-10 DIAGNOSIS — G6289 Other specified polyneuropathies: Secondary | ICD-10-CM

## 2023-04-10 DIAGNOSIS — Z7901 Long term (current) use of anticoagulants: Secondary | ICD-10-CM | POA: Insufficient documentation

## 2023-04-10 DIAGNOSIS — R799 Abnormal finding of blood chemistry, unspecified: Secondary | ICD-10-CM | POA: Diagnosis present

## 2023-04-10 DIAGNOSIS — G40A01 Absence epileptic syndrome, not intractable, with status epilepticus: Secondary | ICD-10-CM

## 2023-04-10 LAB — COMPREHENSIVE METABOLIC PANEL
ALT: 13 U/L (ref 0–44)
AST: 17 U/L (ref 15–41)
Albumin: 3.5 g/dL (ref 3.5–5.0)
Alkaline Phosphatase: 36 U/L — ABNORMAL LOW (ref 38–126)
Anion gap: 15 (ref 5–15)
BUN: 21 mg/dL (ref 8–23)
CO2: 29 mmol/L (ref 22–32)
Calcium: 9.1 mg/dL (ref 8.9–10.3)
Chloride: 92 mmol/L — ABNORMAL LOW (ref 98–111)
Creatinine, Ser: 0.68 mg/dL (ref 0.61–1.24)
GFR, Estimated: >60 mL/min (ref >60)
Glucose, Bld: 198 mg/dL — ABNORMAL HIGH (ref 70–99)
Potassium: 5 mmol/L (ref 3.5–5.1)
Sodium: 136 mmol/L (ref 135–145)
Total Bilirubin: 0.4 mg/dL (ref <1.2)
Total Protein: 7.3 g/dL (ref 6.5–8.1)

## 2023-04-10 LAB — CBC
HCT: 45.2 % (ref 39.0–52.0)
Hemoglobin: 13.3 g/dL (ref 13.0–17.0)
MCH: 27 pg (ref 26.0–34.0)
MCHC: 29.4 g/dL — ABNORMAL LOW (ref 30.0–36.0)
MCV: 91.7 fL (ref 80.0–100.0)
Platelets: 268 10*3/uL (ref 150–400)
RBC: 4.93 MIL/uL (ref 4.22–5.81)
RDW: 16.2 % — ABNORMAL HIGH (ref 11.5–15.5)
WBC: 7.4 10*3/uL (ref 4.0–10.5)
nRBC: 0 % (ref 0.0–0.2)

## 2023-04-10 NOTE — ED Provider Notes (Signed)
[place] EMERGENCY DEPARTMENT AT [facility] Provider Note   CSN: [id number] Arrival date & time: 04/10/23  1011     History  Chief Complaint  Patient presents with   Abnormal Lab    [name] is a 72 y.o. male.  HPI 72 yo male history of dvt, acute respiratory failure, seizures, colon rupture s/p colostomy, chronic anticoagulation, sent in by cardiology with report of hyperkalemia.  Labs on 12 with abnormal gfr, meds changed torsemide decreased, labs resent Wednesday, called this am to come to ED. Reviewed labs potassium 5.7 on 11/27     Home Medications Prior to Admission medications   Medication Sig Start Date End Date Taking? Authorizing Provider  acetaminophen (TYLENOL) 500 MG tablet Take 500-1,000 mg by mouth every 6 (six) hours as needed for mild pain, moderate pain, fever or headache.    [provider]  Alpha-Lipoic Acid 600 MG TABS Take 1 tablet by mouth daily.    [provider]  atorvastatin (LIPITOR) 10 MG tablet Take 10 mg by mouth at bedtime. 12/13/20   [provider]  Calcium Carbonate-Vitamin D 600-400 MG-UNIT tablet Take 1 tablet by mouth daily.    [provider]  Cholecalciferol (VITAMIN D3 PO) Take 1 capsule by mouth daily.    [provider]  diltiazem (CARDIZEM CD) 360 MG 24 hr capsule Take 1 capsule (360 mg total) by mouth daily. 03/24/23 04/23/23  Marjie Skiff E, PA-C  doxepin (SINEQUAN) 75 MG capsule Take 150 mg by mouth at bedtime.     [provider]  DULoxetine (CYMBALTA) 60 MG capsule Take 60 mg by mouth daily.    [provider]  fenofibrate 160 MG tablet Take 160 mg by mouth daily.    [provider]  Ferrous Sulfate (IRON PO) Take 1 tablet by mouth daily.    [provider]  gabapentin (NEURONTIN) 800 MG tablet Take 800 mg by mouth 3 (three) times daily. 09/11/22   [provider]  glipiZIDE (GLUCOTROL XL) 2.5 MG 24 hr tablet Take 2.5 mg by  mouth in the morning and at bedtime.  05/16/17   [provider]  Lactobacillus (PROBIOTIC ACIDOPHILUS PO) Take 1 tablet by mouth daily.    [provider]  lamoTRIgine (LAMICTAL) 150 MG tablet Take 1 tablet (150 mg total) by mouth 2 (two) times daily. 03/11/22   Dohmeier, Porfirio Mylar, MD  levETIRAcetam (KEPPRA) 750 MG tablet Take 2 tablets (1,500 mg total) by mouth 2 (two) times daily. 02/27/23 03/29/23  Jerald Kief, MD  metFORMIN (GLUCOPHAGE) 1000 MG tablet Take 1,000 mg by mouth 2 (two) times daily. 12/18/20   [provider]  metoprolol succinate (TOPROL XL) 50 MG 24 hr tablet Take 1 tablet (50 mg total) by mouth daily. Take with or immediately following a meal. 03/24/23 04/23/23  Marjie Skiff E, PA-C  Multiple Vitamin (MULTIVITAMIN WITH MINERALS) TABS Take 1 tablet by mouth daily.    [provider]  naloxone [facility]) nasal spray 4 mg/0.1 mL Place 1 spray into the nose once. 08/29/20   [provider]  Omega-3 Fatty Acids (FISH OIL) 1000 MG CAPS Take 1,000 mg by mouth 2 (two) times a day.    [provider]  Lum Babe test strip SMARTSIG:1 Strip(s) Via Meter PRN 02/20/23   [provider]  OVER THE COUNTER MEDICATION Apply 1 Application topically See admin instructions. Apply Silver Alginate Dressing to Slow healing wound    [provider]  oxyCODONE-acetaminophen (PERCOCET) 10-325 MG tablet Take 1 tablet by mouth. 02/13/23   [provider]  PRADAXA 150 MG CAPS capsule TAKE 1 CAPSULE BY MOUTH TWICE A DAY 12/29/22   Hilty, Lisette Abu, MD  spironolactone (ALDACTONE) 25 MG tablet Take 1 tablet (25 mg total) by mouth daily. 03/24/23   Marjie Skiff E, PA-C  Torsemide 40 MG TABS Take 40 mg by mouth in the morning and at bedtime. 03/26/23   Corrin Parker, PA-C      Allergies    Penicillins    Review of Systems   Review of Systems  Physical Exam Updated Vital Signs BP (!) 121/58 (BP Location: Right Arm)    Pulse 64   Temp (!) 97.4 F (36.3 C) (Oral)   Resp 16   Ht 1.956 m (6\' 5" )   Wt (!) 140.2 kg   SpO2 95%   BMI 36.64 kg/m  Physical Exam Vitals reviewed.  Constitutional:      Comments: Oxygen on   HENT:     Head: Normocephalic.     Left Ear: External ear normal.     Nose: Nose normal.     Mouth/Throat:     Pharynx: Oropharynx is clear.  Eyes:     Pupils: Pupils are equal, round, and reactive to light.  Cardiovascular:     Rate and Rhythm: Normal rate.     Pulses: Normal pulses.  Pulmonary:     Breath sounds: Normal breath sounds.     Comments: Some cough Abdominal:     Palpations: Abdomen is soft.     Comments: Colostomy bag in place  Musculoskeletal:     Cervical back: Normal range of motion.  Skin:    Capillary Refill: Capillary refill takes less than 2 seconds.     Comments: Chronic venous stasis Pressure ulcer left buttock  Neurological:     Mental Status: He is alert. Mental status is at baseline.  Psychiatric:        Mood and Affect: Mood normal.     ED Results / Procedures / Treatments   Labs (all labs ordered are listed, but only abnormal results are displayed) Labs Reviewed  CBC - Abnormal; Notable for the following components:      Result Value   MCHC 29.4 (*)    RDW 16.2 (*)    All other components within normal limits  COMPREHENSIVE METABOLIC PANEL - Abnormal; Notable for the following components:   Chloride 92 (*)    Glucose, Bld 198 (*)    Alkaline Phosphatase 36 (*)    All other components within normal limits    EKG EKG Interpretation Date/Time:  Friday April 10 2023 10:25:49 EST Ventricular Rate:  90 PR Interval:    QRS Duration:  103 QT Interval:  365 QTC Calculation: 462 R Axis:   -70  Text Interpretation: Atrial fibrillation Aberrant conduction of SV complex(es) Abnormal R-wave progression, late transition Inferior infarct, old Lateral leads are also involved Confirmed by Margarita Grizzle [phone]) on 04/10/2023 10:39:20  AM  Radiology No results found.  Procedures Procedures    Medications Ordered in ED Medications - No data to display  ED Course/ Medical Decision Making/ A&P Clinical Course as of 04/10/23 1202  Fri Apr 10, 2023  1119 CBC is reviewed and interpreted appears to be with in normal limits [DR]  1143 Pleat metabolic panel reviewed and potassium is 5.0 [DR]    Clinical Course User Index [DR] Margarita Grizzle, MD  Medical Decision Making Amount and/or Complexity of Data Reviewed Labs: ordered.  72 year old male presents today reports of hyperkalemia.  Patient's potassium was elevated on blood draw on Wednesday and was called and told to come to the ED for recheck today. Patient denies any syncope, renal failure Differential diagnosis includes but is not limited to hyperkalemia due to lab error and hemolyzation, acute renal failure, medication reaction Evaluated with labs here due to report of hyperkalemia.  Potassium is 5.0 within normal limits here BUN and creatinine are normal at 21 and 0.68         Final Clinical Impression(s) / ED Diagnoses Final diagnoses:  Serum potassium elevated    Rx / DC Orders ED Discharge Orders     None         Margarita Grizzle, MD 04/10/23 1203

## 2023-04-10 NOTE — ED Triage Notes (Signed)
Patient sent by cardiology for K+ of 5.7. Patient has no complaints.

## 2023-04-10 NOTE — Discharge Instructions (Signed)
Potassium today was normal. Please follow-up with your doctor next week Please return if you are having any worsening symptoms

## 2023-04-18 ENCOUNTER — Other Ambulatory Visit: Payer: Self-pay | Admitting: Student

## 2023-05-08 ENCOUNTER — Telehealth: Payer: Self-pay | Admitting: Neurology

## 2023-05-08 ENCOUNTER — Other Ambulatory Visit: Payer: Self-pay | Admitting: Neurology

## 2023-05-08 DIAGNOSIS — G40A01 Absence epileptic syndrome, not intractable, with status epilepticus: Secondary | ICD-10-CM

## 2023-05-08 DIAGNOSIS — R198 Other specified symptoms and signs involving the digestive system and abdomen: Secondary | ICD-10-CM

## 2023-05-08 DIAGNOSIS — Z86718 Personal history of other venous thrombosis and embolism: Secondary | ICD-10-CM

## 2023-05-08 DIAGNOSIS — Z7901 Long term (current) use of anticoagulants: Secondary | ICD-10-CM

## 2023-05-08 DIAGNOSIS — G6289 Other specified polyneuropathies: Secondary | ICD-10-CM

## 2023-05-08 MED ORDER — LEVETIRACETAM 750 MG PO TABS: 1500.0000 mg | ORAL_TABLET | Freq: Two times a day (BID) | ORAL | 0 refills | Status: DC

## 2023-05-08 NOTE — Telephone Encounter (Signed)
Pt is requesting a refill for levETIRAcetam (KEPPRA) 750 MG tablet .  Pharmacy: [id number]

## 2023-05-08 NOTE — Telephone Encounter (Signed)
Refill sent to the pharmacy for pt. Must keep upcoming apt in march for future refills

## 2023-05-12 ENCOUNTER — Other Ambulatory Visit: Payer: Self-pay | Admitting: Neurology

## 2023-05-18 ENCOUNTER — Ambulatory Visit (HOSPITAL_BASED_OUTPATIENT_CLINIC_OR_DEPARTMENT_OTHER): Payer: Medicare Other | Admitting: General Surgery

## 2023-05-21 ENCOUNTER — Encounter (HOSPITAL_BASED_OUTPATIENT_CLINIC_OR_DEPARTMENT_OTHER): Admitting: General Surgery

## 2023-05-30 ENCOUNTER — Other Ambulatory Visit: Payer: Self-pay | Admitting: Neurology

## 2023-06-01 ENCOUNTER — Encounter (HOSPITAL_BASED_OUTPATIENT_CLINIC_OR_DEPARTMENT_OTHER): Payer: Medicare Other | Attending: General Surgery | Admitting: General Surgery

## 2023-06-01 DIAGNOSIS — G8220 Paraplegia, unspecified: Secondary | ICD-10-CM | POA: Insufficient documentation

## 2023-06-01 DIAGNOSIS — Z6837 Body mass index (BMI) 37.0-37.9, adult: Secondary | ICD-10-CM | POA: Insufficient documentation

## 2023-06-01 DIAGNOSIS — I110 Hypertensive heart disease with heart failure: Secondary | ICD-10-CM | POA: Insufficient documentation

## 2023-06-01 DIAGNOSIS — I509 Heart failure, unspecified: Secondary | ICD-10-CM | POA: Diagnosis not present

## 2023-06-01 DIAGNOSIS — L89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, stage 3: Secondary | ICD-10-CM | POA: Insufficient documentation

## 2023-06-01 DIAGNOSIS — Z932 Ileostomy status: Secondary | ICD-10-CM | POA: Diagnosis not present

## 2023-06-01 DIAGNOSIS — E11622 Type 2 diabetes mellitus with other skin ulcer: Secondary | ICD-10-CM | POA: Diagnosis present

## 2023-06-01 DIAGNOSIS — Z933 Colostomy status: Secondary | ICD-10-CM | POA: Diagnosis not present

## 2023-06-02 NOTE — Progress Notes (Signed)
OM, BARNA [place] ([id number]) [id number] Page 1 of 7 Visit Report for 06/01/2023 Arrival Information Details Patient Name: Date of Service: [name] 06/01/2023 10:30 A M Medical Record Number: [id number] Patient Account Number: [id number] Date of Birth/Sex: Treating RN: 02-12-1951 (73 y.o. [name] Primary Care Sota Hetz: Caffie Damme Other Clinician: Referring Kelvon Giannini: Treating Joseeduardo Brix/Extender: Haze Rushing in Treatment: 66 Visit Information History Since Last Visit Added or deleted any medications: No Patient Arrived: Stretcher Any new allergies or adverse reactions: No Arrival Time: 10:33 Had a fall or experienced change in No Accompanied By: sister activities of daily living that may affect Transfer Assistance: Manual risk of falls: Patient Identification Verified: Yes Signs or symptoms of abuse/neglect since last visito No Patient Requires Transmission-Based Precautions: No Hospitalized since last visit: No Patient Has Alerts: No Implantable device outside of the clinic excluding No cellular tissue based products placed in the center since last visit: Pain Present Now: No Electronic Signature(s) Signed: 06/02/2023 4:21:14 PM By: Karie Schwalbe RN Entered By: Karie Schwalbe on 06/01/2023 10:33:47 -------------------------------------------------------------------------------- Encounter Discharge Information Details Patient Name: Date of Service: [name]. 06/01/2023 10:30 A M Medical Record Number: [id number] Patient Account Number: [id number] Date of Birth/Sex: Treating RN: 1950-10-17 (73 y.o. [name] Primary Care Karishma Unrein: Caffie Damme Other Clinician: Referring Maevyn Riordan: Treating Azula Zappia/Extender: Haze Rushing in Treatment: 18 Encounter Discharge Information Items Post Procedure Vitals Discharge Condition: Stable Temperature (F): 97.7 Ambulatory Status:  Stretcher Pulse (bpm): 74 Discharge Destination: Home Respiratory Rate (breaths/min): 16 Transportation: Private Auto Blood Pressure (mmHg): 100/65 Accompanied By: sister Schedule Follow-up Appointment: Yes Clinical Summary of Care: Patient Declined Electronic Signature(s) Signed: 06/02/2023 4:21:14 PM By: Karie Schwalbe RN Entered By: Karie Schwalbe on 06/02/2023 16:17:36 [name] ([id number]) [id number] Page 2 of 7 -------------------------------------------------------------------------------- Lower Extremity Assessment Details Patient Name: Date of Service: [name] 06/01/2023 10:30 A M Medical Record Number: [id number] Patient Account Number: [id number] Date of Birth/Sex: Treating RN: [birth year] (73 y.o. [name] Primary Care Wacey Zieger: Caffie Damme Other Clinician: Referring Andriana Casa: Treating Aiyah Scarpelli/Extender: Murvin Natal Weeks in Treatment: 69 Electronic Signature(s) Signed: 06/02/2023 4:21:14 PM By: Karie Schwalbe RN Entered By: Karie Schwalbe on 06/01/2023 10:34:40 -------------------------------------------------------------------------------- Multi Wound Chart Details Patient Name: Date of Service: [name]. 06/01/2023 10:30 A M Medical Record Number: [id number] Patient Account Number: [id number] Date of Birth/Sex: Treating RN: [birth year] (73 y.o. M) Primary Care Raechelle Sarti: Caffie Damme Other Clinician: Referring Sherlonda Flater: Treating Chelly Dombeck/Extender: Murvin Natal Weeks in Treatment: 37 Vital Signs Height(in): 77 Capillary Blood Glucose(mg/dl): 573 Weight(lbs): 220 Pulse(bpm): 74 Body Mass Index(BMI): 37.7 Blood Pressure(mmHg): 100/65 Temperature(F): 97.7 Respiratory Rate(breaths/min): 16 [3:Photos:] [N/A:N/A] Left Gluteus N/A N/A Wound Location: Gradually Appeared N/A N/A Wounding Event: Pressure Ulcer N/A N/A Primary Etiology: Congestive Heart Failure, N/A  N/A Comorbid History: Hypertension, Type I Diabetes, Neuropathy, Paraplegia 05/14/2021 N/A N/A Date Acquired: 51 N/A N/A Weeks of Treatment: Open N/A N/A Wound Status: No N/A N/A Wound Recurrence: 13x17x0.1 N/A N/A Measurements L x W x D (cm) 173.573 N/A N/A A (cm) : rea 17.357 N/A N/A Volume (cm) : -4.00% N/A N/A % Reduction in A rea: -4.00% N/A N/A % Reduction in Volume: Category/Stage III N/A N/A Classification: Medium N/A N/A Exudate A mount: Serosanguineous N/A N/A Exudate Type: red, brown N/A N/A Exudate Color: Distinct, outline attached N/A N/A Wound Margin: Medium (34-66%) N/A N/A Granulation A mount: Red, Hyper-granulation, Friable N/A  N/A Granulation [employer]IMOUS, SUIRE ([id number]) [id number] Page 3 of 7 Medium (34-66%) N/A N/A Necrotic Amount: Eschar, Adherent Slough N/A N/A Necrotic Tissue: Fat Layer (Subcutaneous Tissue): Yes N/A N/A Exposed Structures: Fascia: No Tendon: No Muscle: No Joint: No Bone: No Medium (34-66%) N/A N/A Epithelialization: Debridement - Excisional N/A N/A Debridement: Pre-procedure Verification/Time Out 10:50 N/A N/A Taken: Lidocaine 4% T opical Solution N/A N/A Pain Control: Necrotic/Eschar, Subcutaneous, N/A N/A Tissue Debrided: Slough Skin/Subcutaneous Tissue N/A N/A Level: 173.48 N/A N/A Debridement A (sq cm): rea Curette N/A N/A Instrument: Moderate N/A N/A Bleeding: Other Topical Anticoagulant N/A N/A Hemostasis Achieved: Debridement Treatment Response: Procedure was tolerated well N/A N/A Post Debridement Measurements L x 13x17x0.1 N/A N/A W x D (cm) 17.357 N/A N/A Post Debridement Volume: (cm) Category/Stage III N/A N/A Post Debridement Stage: Scarring: Yes N/A N/A Periwound Skin Texture: Excoriation: No Induration: No Callus: No Crepitus: No Rash: No Dry/Scaly: Yes N/A N/A Periwound Skin Moisture: Maceration: No Atrophie Blanche: No N/A N/A Periwound  Skin Color: Cyanosis: No Ecchymosis: No Erythema: No Hemosiderin Staining: No Mottled: No Pallor: No Rubor: No No Abnormality N/A N/A Temperature: Yes N/A N/A Tenderness on Palpation: Debridement N/A N/A Procedures Performed: Treatment Notes Electronic Signature(s) Signed: 06/01/2023 11:01:09 AM By: Duanne Guess MD FACS Entered By: Duanne Guess on 06/01/2023 11:01:09 -------------------------------------------------------------------------------- Multi-Disciplinary Care Plan Details Patient Name: Date of Service: [name]. 06/01/2023 10:30 A M Medical Record Number: [id number] Patient Account Number: [id number] Date of Birth/Sex: Treating RN: 15-Apr-1951 (73 y.o. [name] Primary Care Franciszek Platten: Caffie Damme Other Clinician: Referring Zollie Ellery: Treating Taci Sterling/Extender: Haze Rushing in Treatment: 33 Multidisciplinary Care Plan reviewed with physician Active Inactive Nutrition Nursing Diagnoses: Potential for alteratiion in Nutrition/Potential for imbalanced nutrition Goals: Patient/caregiver verbalizes understanding of need to maintain therapeutic glucose control per primary care physician CHAS, BRANAN ([id number]) [id number] Page 4 of 7 Date Initiated: 03/19/2022 Target Resolution Date: 08/10/2023 Goal Status: Active Interventions: Provide education on elevated blood sugars and impact on wound healing Treatment Activities: Dietary management education, guidance and counseling : 03/19/2022 Notes: Wound/Skin Impairment Nursing Diagnoses: Knowledge deficit related to ulceration/compromised skin integrity Goals: Ulcer/skin breakdown will have a volume reduction of 30% by week 4 Date Initiated: 03/19/2022 Target Resolution Date: 08/10/2023 Goal Status: Active Interventions: Assess ulceration(s) every visit Provide education on ulcer and skin care Treatment Activities: Skin care regimen  initiated : 03/19/2022 Notes: Electronic Signature(s) Signed: 06/02/2023 4:21:14 PM By: Karie Schwalbe RN Entered By: Karie Schwalbe on 06/02/2023 16:15:59 -------------------------------------------------------------------------------- Pain Assessment Details Patient Name: Date of Service: [name]. 06/01/2023 10:30 A M Medical Record Number: [id number] Patient Account Number: [id number] Date of Birth/Sex: Treating RN: [birth year] (74 y.o. [name] Primary Care Sevannah Madia: Caffie Damme Other Clinician: Referring Murlene Revell: Treating Raenell Mensing/Extender: Haze Rushing in Treatment: 21 Active Problems Location of Pain Severity and Description of Pain Patient Has Paino No Site Locations Pain Management and Medication Current Pain Management: RASHAUN, STURCH ([id number]) [id number] Page 5 of 7 Electronic Signature(s) Signed: 06/02/2023 4:21:14 PM By: Karie Schwalbe RN Entered By: Karie Schwalbe on 06/01/2023 10:34:31 -------------------------------------------------------------------------------- Patient/Caregiver Education Details Patient Name: Date of Service: [name] 1/20/2025andnbsp10:30 A M Medical Record Number: [id number] Patient Account Number: [id number] Date of Birth/Gender: Treating RN: Jan 13, 1951 (73 y.o. [name] Primary Care Physician: Caffie Damme Other Clinician: Referring Physician: Treating Physician/Extender: Haze Rushing in Treatment: 34 Education Assessment Education Provided To: Patient Education  Topics Provided Wound/Skin Impairment: Methods: Demonstration, Explain/Verbal Responses: State content correctly Electronic Signature(s) Signed: 06/02/2023 4:21:14 PM By: Karie Schwalbe RN Entered By: Karie Schwalbe on 06/02/2023 16:16:37 -------------------------------------------------------------------------------- Wound Assessment Details Patient Name:  Date of Service: [name]. 06/01/2023 10:30 A M Medical Record Number: [id number] Patient Account Number: [id number] Date of Birth/Sex: Treating RN: 10/11/1950 (73 y.o. [name] Primary Care Chi Woodham: Caffie Damme Other Clinician: Referring Shalawn Wynder: Treating Shenekia Riess/Extender: Murvin Natal Weeks in Treatment: 79 Wound Status Wound Number: 3 Primary Pressure Ulcer Etiology: Wound Location: Left Gluteus Wound Open Wounding Event: Gradually Appeared Status: Date Acquired: 05/14/2021 Comorbid Congestive Heart Failure, Hypertension, Type I Diabetes, Weeks Of Treatment: 62 History: Neuropathy, Paraplegia Clustered Wound: No Photos NEYO, WILLMARTH ([id number]) [id number] Page 6 of 7 Wound Measurements Length: (cm) 13 Width: (cm) 17 Depth: (cm) 0.1 Area: (cm) 173.573 Volume: (cm) 17.357 % Reduction in Area: -4% % Reduction in Volume: -4% Epithelialization: Medium (34-66%) Tunneling: No Undermining: No Wound Description Classification: Category/Stage III Wound Margin: Distinct, outline attached Exudate Amount: Medium Exudate Type: Serosanguineous Exudate Color: red, brown Foul Odor After Cleansing: No Slough/Fibrino Yes Wound Bed Granulation Amount: Medium (34-66%) Exposed Structure Granulation Quality: Red, Hyper-granulation, Friable Fascia Exposed: No Necrotic Amount: Medium (34-66%) Fat Layer (Subcutaneous Tissue) Exposed: Yes Necrotic Quality: Eschar, Adherent Slough Tendon Exposed: No Muscle Exposed: No Joint Exposed: No Bone Exposed: No Periwound Skin Texture Texture Color No Abnormalities Noted: No No Abnormalities Noted: Yes Callus: No Temperature / Pain Crepitus: No Temperature: No Abnormality Excoriation: No Tenderness on Palpation: Yes Induration: No Rash: No Scarring: Yes Moisture No Abnormalities Noted: No Dry / Scaly: Yes Maceration: No Treatment Notes Wound #3 (Gluteus) Wound  Laterality: Left Cleanser Soap and Water Discharge Instruction: May shower and wash wound with dial antibacterial soap and water prior to dressing change. Wound Cleanser Discharge Instruction: Cleanse the wound with wound cleanser prior to applying a clean dressing using gauze sponges, not tissue or cotton balls. Peri-Wound Care Topical Gentamicin Discharge Instruction: As directed by physician Mupirocin Ointment Discharge Instruction: Apply Mupirocin (Bactroban) as instructed Ketoconazole Cream 2% Discharge Instruction: Apply Ketoconazole as directed Primary Dressing Maxorb Extra Ag+ Alginate Dressing, 4x4.75 (in/in) Discharge Instruction: Apply to wound bed as instructed ABDULLOH, CONROY ([id number]) [id number] Page 7 of 7 Secondary Dressing ABD Pad, 8x10 Discharge Instruction: Apply over primary dressing as directed. Secured With 65M Medipore H Soft Cloth Surgical T ape, 4 x 10 (in/yd) Discharge Instruction: Secure with tape as directed. Compression Wrap Compression Stockings Add-Ons Electronic Signature(s) Signed: 06/02/2023 4:21:14 PM By: Karie Schwalbe RN Entered By: Karie Schwalbe on 06/01/2023 10:55:44 -------------------------------------------------------------------------------- Vitals Details Patient Name: Date of Service: [name]. 06/01/2023 10:30 A M Medical Record Number: [id number] Patient Account Number: [id number] Date of Birth/Sex: Treating RN: 1950-12-07 (73 y.o. [name] Primary Care Jaliah Foody: Caffie Damme Other Clinician: Referring Parminder Cupples: Treating Joshva Labreck/Extender: Murvin Natal Weeks in Treatment: 16 Vital Signs Time Taken: 10:34 Temperature (F): 97.7 Height (in): 77 Pulse (bpm): 74 Weight (lbs): 318 Respiratory Rate (breaths/min): 16 Body Mass Index (BMI): 37.7 Blood Pressure (mmHg): 100/65 Capillary Blood Glucose (mg/dl): 295 Reference Range: 80 - 120 mg / dl Electronic  Signature(s) Signed: 06/02/2023 4:21:14 PM By: Karie Schwalbe RN Entered By: Karie Schwalbe on 06/01/2023 10:34:23

## 2023-06-02 NOTE — Progress Notes (Signed)
[name], [name] [place] ([id number]) [id number] Page 1 of 8 Visit Report for 06/01/2023 Chief Complaint Document Details Patient Name: Date of Service: [name] 06/01/2023 10:30 A M Medical Record Number: [id number] Patient Account Number: [id number] Date of Birth/Sex: Treating RN: 09/10/1950 (73 y.o. M) Primary Care Provider: Caffie Damme Other Clinician: Referring Provider: Treating Provider/Extender: Haze Rushing in Treatment: 59 Information Obtained from: Patient Chief Complaint Here for follow up abdominal wound present following laparotomy, colostomy. 03/19/2022: Here for pressure ulcer on left gluteus and ulcer adjacent to ileostomy Electronic Signature(s) Signed: 06/01/2023 11:07:33 AM By: Duanne Guess MD FACS Entered By: Duanne Guess on 06/01/2023 11:07:33 -------------------------------------------------------------------------------- Debridement Details Patient Name: Date of Service: [name]. 06/01/2023 10:30 A M Medical Record Number: [id number] Patient Account Number: [id number] Date of Birth/Sex: Treating RN: 1950-12-26 (73 y.o. [name] Primary Care Provider: Caffie Damme Other Clinician: Referring Provider: Treating Provider/Extender: Haze Rushing in Treatment: 62 Debridement Performed for Assessment: Wound #3 Left Gluteus Performed By: Physician Duanne Guess, MD The following information was scribed by: Karie Schwalbe The information was scribed for: Duanne Guess Debridement Type: Debridement Level of Consciousness (Pre-procedure): Awake and Alert Pre-procedure Verification/Time Out Yes - 10:50 Taken: Start Time: 10:50 Pain Control: Lidocaine 4% T opical Solution Percent of Wound Bed Debrided: 100% T Area Debrided (cm): otal 173.48 Tissue and other material debrided: Viable, Non-Viable, Eschar, Slough, Subcutaneous, Slough Level: Skin/Subcutaneous  Tissue Debridement Description: Excisional Instrument: Curette Bleeding: Moderate Hemostasis Achieved: Other T opical Anticoagulant : Response to Treatment: Procedure was tolerated well Level of Consciousness (Post- Awake and Alert procedure): Post Debridement Measurements of Total Wound Length: (cm) 13 Stage: Category/Stage III Width: (cm) 17 Depth: (cm) 0.1 Volume: (cm) 17.357 Character of Wound/Ulcer Post Debridement: Improved [name], [name] ([id number]) [id number] Page 2 of 8 Post Procedure Diagnosis Same as Pre-procedure Electronic Signature(s) Signed: 06/01/2023 2:59:12 PM By: Duanne Guess MD FACS Signed: 06/02/2023 4:21:14 PM By: Karie Schwalbe RN Entered By: Karie Schwalbe on 06/01/2023 10:55:25 -------------------------------------------------------------------------------- HPI Details Patient Name: Date of Service: [name]. 06/01/2023 10:30 A M Medical Record Number: [id number] Patient Account Number: [id number] Date of Birth/Sex: Treating RN: 1950/09/17 (73 y.o. M) Primary Care Provider: Caffie Damme Other Clinician: Referring Provider: Treating Provider/Extender: Haze Rushing in Treatment: 30 History of Present Illness HPI Description: On medihoney with border dressing due to stalling on collagen and concern ABD was pulling off skin. 09/11/14 Nearly healed, counseled given the thin attenuated scar skin I expect will have some recurrence wounds with slight trauma or maceration, however much improved today. cont ABD pads and medihoney. 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 He resides with his sister and is on a regular hospital bed. The 2 of them reported that for about the last year, he has had ulceration adjacent to his ileostomy as well as on his left gluteus and upper posterior thigh. Most recent hemoglobin A1c was 6.3%. On his left gluteus, there is a stage II pressure ulcer with surrounding tissue maceration and excoriation. There is a little slough on the wound surface. Adjacent to his ileostomy, associated with his appliance, there is an ulcer that extends into the fat  layer. It is clean without any slough or necrotic tissue. 04/10/2022: The left gluteal ulcer has expanded and is quite a bit larger today. There is slough and eschar accumulation. The patient's sister reports that the ileostomy associated wound got better and so they did not go to see the outpatient ostomy nurse. They have not received the low-air-loss mattress. 05/01/2022: Apparently the low-air-loss mattress has been denied by his insurance, but they are willing to cover a gel mattress overlay. The ileostomy-area wound has contracted further, per the patient's sister; we are not managing this wound at this time. The left gluteal ulcer seems to be about the same size, but there is extensive slough on all of the open surfaces. His skin is extremely friable. 05/22/2022: The gel mattress overlay was finally delivered last week. The left gluteal ulcer has deteriorated somewhat. There is slough on all of the open surfaces and the total area of the wound is larger. It has a funky odor to it today. 06/13/2022: The gluteal wound is smaller and has substantially less slough. Epithelium is filling in around the margins. The odor that was present at his last visit has dissipated. 07/03/2022: The gluteal wound is a little bit smaller again today. It seems more superficial and just has a light layer of slough on the surface. 08/07/2022: No significant change to the gluteal wound. The patient's sister  does report that it has bled less and seems less friable. 09/05/2022: The gluteal wound looks a little bit better, with smaller dimensions and less friable tissue. He has had some breakdown on his perineum, adjacent to his scrotum. It appears similar in nature to the gluteal wound. 10/03/2022: The wounds all look a little bit more superficial today. They are less friable. He does have a lot of scaly dry skin around each of the wound areas. There is slough on the wound surfaces. 11/11/2022: The wound dimensions are basically unchanged. The perineal ulcer looks a little bit deeper. He has scaly shaggy skin hanging from all of the wound surfaces along with some slough. 12/11/2022: All of the open areas have converged and appear deeper today. There is slough on the surfaces along with some hypertrophic granulation tissue. 01/08/2023: There has been some epithelialization between the wound sites so we are back to a patchwork of open areas. There is some slough accumulation on the surfaces with a little bit of eschar. No malodor or purulent drainage. 02/05/2023: The wound area measured larger today. The patient's sister thinks that the Medihoney is keeping things too wet and contributed to some moisture- related breakdown. There is slough on all of the open wound surfaces. 03/12/2023: The patient was hospitalized about a week ago for hypoxemia and CHF. The wounds have enlarged somewhat since I last saw them. There is slough accumulation of all of the surfaces. 04/06/2023: The wound actually looks fairly good today. There is a fair amount of crusted eschar and slough accumulation, but the overall surface area appears smaller. [name], [name] [place] ([id number]) [id number] Page 3 of 8 06/01/2023: No significant change to the wound overall. The open areas are a bit friable with some slough accumulation. There is some dry crusting on the skin, but underneath much of this, no wound is  identified. Electronic Signature(s) Signed: 06/01/2023 11:11:32 AM By: Duanne Guess MD FACS Entered By: Duanne Guess on 06/01/2023 11:11:32 -------------------------------------------------------------------------------- Physical Exam Details Patient Name: Date of Service: [name] 06/01/2023 10:30 A M Medical Record Number: [id number] Patient Account Number: [id number]  Date of Birth/Sex: Treating RN: 04/16/1951 (73 y.o. M) Primary Care Provider: Caffie Damme Other Clinician: Referring Provider: Treating Provider/Extender: Murvin Natal Weeks in Treatment: 53 Constitutional . . . . no acute distress. Respiratory Normal work of breathing on supplemental oxygen. Notes 06/01/2023: No significant change to the wound overall. The open areas are a bit friable with some slough accumulation. There is some dry crusting on the skin, but underneath much of this, no wound is identified. Electronic Signature(s) Signed: 06/01/2023 11:52:48 AM By: Duanne Guess MD FACS Previous Signature: 06/01/2023 11:12:50 AM Version By: Duanne Guess MD FACS Entered By: Duanne Guess on 06/01/2023 11:52:48 -------------------------------------------------------------------------------- Physician Orders Details Patient Name: Date of Service: [name]. 06/01/2023 10:30 A M Medical Record Number: [id number] Patient Account Number: [id number] Date of Birth/Sex: Treating RN: 01-Jul-1950 (73 y.o. [name] Primary Care Provider: Caffie Damme Other Clinician: Referring Provider: Treating Provider/Extender: Haze Rushing in Treatment: 14 Verbal / Phone Orders: No Diagnosis Coding Follow-up Appointments ppointment in: - 6 WEEKS - *******STRETCHER********* Dr. Lady Gary Return A Anesthetic Wound #3 Left Gluteus (In clinic) Topical Lidocaine 4% applied to wound bed Bathing/ Shower/ Hygiene May shower and wash wound with soap and  water. Off-Loading Wound #3 Left Gluteus Gel mattress overlay (Group 1) Turn and reposition every 2 hours [name], [name] ([id number]) [phone].pdf Page 4 of 8 Home Health Admit to Home Health for skilled nursing wound care. May utilize formulary equivalent dressing for wound treatment orders unless otherwise specified. New wound care orders this week; continue Home Health for wound care. May utilize formulary equivalent dressing for wound treatment orders unless otherwise specified. Dressing changes to be completed by Home Health on Monday / Wednesday / Friday except when patient has scheduled visit at [facility]. Other Home Health Orders/Instructions: - [facility] Wound Treatment Wound #3 - Gluteus Wound Laterality: Left Cleanser: Soap and Water 1 x Per Day/30 Days Discharge Instructions: May shower and wash wound with dial antibacterial soap and water prior to dressing change. Cleanser: Wound Cleanser (Generic) 1 x Per Day/30 Days Discharge Instructions: Cleanse the wound with wound cleanser prior to applying a clean dressing using gauze sponges, not tissue or cotton balls. Topical: Gentamicin 1 x Per Day/30 Days Discharge Instructions: As directed by physician Topical: Mupirocin Ointment 1 x Per Day/30 Days Discharge Instructions: Apply Mupirocin (Bactroban) as instructed Topical: Ketoconazole Cream 2% 1 x Per Day/30 Days Discharge Instructions: Apply Ketoconazole as directed Prim Dressing: Maxorb Extra Ag+ Alginate Dressing, 4x4.75 (in/in) (Generic) 1 x Per Day/30 Days ary Discharge Instructions: Apply to wound bed as instructed Secondary Dressing: ABD Pad, 8x10 (Generic) 1 x Per Day/30 Days Discharge Instructions: Apply over primary dressing as directed. Secured With: 17M Medipore H Soft Cloth Surgical T ape, 4 x 10 (in/yd) (Generic) 1 x Per Day/30 Days Discharge Instructions: Secure with tape as directed. Electronic Signature(s) Signed:  06/01/2023 2:59:12 PM By: Duanne Guess MD FACS Entered By: Duanne Guess on 06/01/2023 11:15:43 -------------------------------------------------------------------------------- Problem List Details Patient Name: Date of Service: [name]. 06/01/2023 10:30 A M Medical Record Number: [id number] Patient Account Number: [id number] Date of Birth/Sex: Treating RN: 26-Aug-1950 (73 y.o. M) Primary Care Provider: Caffie Damme Other Clinician: Referring Provider: Treating Provider/Extender: Murvin Natal Weeks in Treatment: 48 Active Problems ICD-10 Encounter Code Description Active Date MDM Diagnosis 332 [id number] Pressure ulcer of left buttock, stage 3 03/19/2022 No Yes E11.622 Type 2 diabetes mellitus with other skin ulcer 03/19/2022 No Yes  E66.01 Morbid (severe) obesity due to excess calories 03/19/2022 No Yes G62.89 Other specified polyneuropathies 03/19/2022 No Yes [name], [name] ([id number]) [id number] Page 5 of 8 Z93.2 Ileostomy status 03/19/2022 No Yes Inactive Problems ICD-10 Code Description Active Date Inactive Date L98.492 Non-pressure chronic ulcer of skin of other sites with fat layer exposed 03/19/2022 03/19/2022 Resolved Problems Electronic Signature(s) Signed: 06/01/2023 11:01:01 AM By: Duanne Guess MD FACS Entered By: Duanne Guess on 06/01/2023 11:01:01 -------------------------------------------------------------------------------- Progress Note Details Patient Name: Date of Service: [name]. 06/01/2023 10:30 A M Medical Record Number: [id number] Patient Account Number: [id number] Date of Birth/Sex: Treating RN: 28-Apr-1951 (73 y.o. M) Primary Care Provider: Caffie Damme Other Clinician: Referring Provider: Treating Provider/Extender: Haze Rushing in Treatment: 26 Subjective Chief Complaint Information obtained from Patient Here for follow up abdominal wound present following  laparotomy, colostomy. 03/19/2022: Here for pressure ulcer on left gluteus and ulcer adjacent to ileostomy History of Present Illness (HPI) On medihoney with border dressing due to stalling on collagen and concern ABD was pulling off skin. 09/11/14 Nearly healed, counseled given the thin attenuated scar skin I expect will have some recurrence wounds with slight trauma or maceration, however much improved today. cont ABD pads and medihoney. 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 He resides with his sister and is on a regular hospital bed. The 2 of them reported that for about the last year, he has had ulceration adjacent to his ileostomy as well as on his left gluteus and upper posterior thigh. Most recent hemoglobin A1c was 6.3%. On his left gluteus, there is a stage II pressure ulcer with surrounding tissue maceration and excoriation. There is a little slough on the wound surface. Adjacent to his ileostomy, associated with his appliance, there is an ulcer that extends into the fat layer. It is clean without any slough or necrotic tissue. 04/10/2022: The left gluteal ulcer has expanded and is quite a bit larger today. There is slough and eschar accumulation. The patient's sister reports that the ileostomy associated wound got better and so they did not go to see the outpatient ostomy nurse. They have not received the low-air-loss mattress. 05/01/2022: Apparently the low-air-loss mattress has been denied by his insurance, but they are willing to cover a gel mattress overlay. The ileostomy-area wound has contracted further, per the patient's sister; we are not  managing this wound at this time. The left gluteal ulcer seems to be about the same size, but there is extensive slough on all of the open surfaces. His skin is extremely friable. 05/22/2022: The gel mattress overlay was finally delivered last week. The left gluteal ulcer has deteriorated somewhat. There is slough on all of the open surfaces and the total area of the wound is larger. It has a funky odor to it today. 06/13/2022: The gluteal wound is smaller and has substantially less slough. Epithelium is filling in around the margins. The odor that was present at his last visit has dissipated. 07/03/2022: The gluteal wound is a little bit smaller again today. It seems more superficial and just has a light layer of slough on the surface. 08/07/2022: No significant change to the gluteal wound. The patient's sister does report that it has  bled less and seems less friable. 09/05/2022: The gluteal wound looks a little bit better, with smaller dimensions and less friable tissue. He has had some breakdown on his perineum, adjacent to his scrotum. It appears similar in nature to the gluteal wound. [name], [name] [place] ([id number]) [id number] Page 6 of 8 10/03/2022: The wounds all look a little bit more superficial today. They are less friable. He does have a lot of scaly dry skin around each of the wound areas. There is slough on the wound surfaces. 11/11/2022: The wound dimensions are basically unchanged. The perineal ulcer looks a little bit deeper. He has scaly shaggy skin hanging from all of the wound surfaces along with some slough. 12/11/2022: All of the open areas have converged and appear deeper today. There is slough on the surfaces along with some hypertrophic granulation tissue. 01/08/2023: There has been some epithelialization between the wound sites so we are back to a patchwork of open areas. There is some slough accumulation on the surfaces with a little bit of eschar. No malodor  or purulent drainage. 02/05/2023: The wound area measured larger today. The patient's sister thinks that the Medihoney is keeping things too wet and contributed to some moisture- related breakdown. There is slough on all of the open wound surfaces. 03/12/2023: The patient was hospitalized about a week ago for hypoxemia and CHF. The wounds have enlarged somewhat since I last saw them. There is slough accumulation of all of the surfaces. 04/06/2023: The wound actually looks fairly good today. There is a fair amount of crusted eschar and slough accumulation, but the overall surface area appears smaller. 06/01/2023: No significant change to the wound overall. The open areas are a bit friable with some slough accumulation. There is some dry crusting on the skin, but underneath much of this, no wound is identified. Objective Constitutional no acute distress. Vitals Time Taken: 10:34 AM, Height: 77 in, Weight: 318 lbs, BMI: 37.7, Temperature: 97.7 F, Pulse: 74 bpm, Respiratory Rate: 16 breaths/min, Blood Pressure: 100/65 mmHg, Capillary Blood Glucose: 177 mg/dl. Respiratory Normal work of breathing on supplemental oxygen. General Notes: 06/01/2023: No significant change to the wound overall. The open areas are a bit friable with some slough accumulation. There is some dry crusting on the skin, but underneath much of this, no wound is identified. Integumentary (Hair, Skin) Wound #3 status is Open. Original cause of wound was Gradually Appeared. The date acquired was: 05/14/2021. The wound has been in treatment 62 weeks. The wound is located on the Left Gluteus. The wound measures 13cm length x 17cm width x 0.1cm depth; 173.573cm^2 area and 17.357cm^3 volume. There is Fat Layer (Subcutaneous Tissue) exposed. There is no tunneling or undermining noted. There is a medium amount of serosanguineous drainage noted. The wound margin is distinct with the outline attached to the wound base. There is medium (34-66%)  red, friable, hyper - granulation within the wound bed. There is a medium (34-66%) amount of necrotic tissue within the wound bed including Eschar and Adherent Slough. The periwound skin appearance had no abnormalities noted for color. The periwound skin appearance exhibited: Scarring, Dry/Scaly. The periwound skin appearance did not exhibit: Callus, Crepitus, Excoriation, Induration, Rash, Maceration. Periwound temperature was noted as No Abnormality. The periwound has tenderness on palpation. 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 There was a Excisional Skin/Subcutaneous Tissue Debridement with a total area of 173.48 sq cm performed by Duanne Guess, MD. With the following instrument(s): Curette to remove Viable and Non-Viable tissue/material. Material removed includes Eschar, Subcutaneous Tissue, and Slough after achieving pain control using Lidocaine 4% T opical Solution. No specimens were taken. A time out was conducted at 10:50, prior to the start of the procedure. A Moderate amount of bleeding was controlled with Other Topical Anticoagulant. The procedure was tolerated well. Post Debridement Measurements: 13cm length x 17cm width x 0.1cm depth; 17.357cm^3 volume. Post debridement Stage noted as Category/Stage III. Character of Wound/Ulcer Post Debridement is improved. Post procedure Diagnosis Wound #3: Same as Pre-Procedure [name], [name] ([id number]) [id number] Page 7 of 8 Plan Follow-up Appointments: Return Appointment in: - 6 WEEKS - *******STRETCHER********* Dr. Lady Gary Anesthetic: Wound #3 Left Gluteus: (In clinic) Topical Lidocaine 4% applied to wound bed Bathing/  Shower/ Hygiene: May shower and wash wound with soap and water. Off-Loading: Wound #3 Left Gluteus: Gel mattress overlay (Group 1) Turn and reposition every 2 hours Home Health: Admit to Home Health for skilled nursing wound care. May utilize formulary equivalent dressing for wound treatment orders unless otherwise specified. New wound care orders this week; continue Home Health for wound care. May utilize formulary equivalent dressing for wound treatment orders unless otherwise specified. Dressing changes to be completed by Home Health on Monday / Wednesday / Friday except when patient has scheduled visit at [facility]. Other Home Health Orders/Instructions: - [facility] WOUND #3: - Gluteus Wound Laterality: Left Cleanser: Soap and Water 1 x Per Day/30 Days Discharge Instructions: May shower and wash wound with dial antibacterial soap and water prior to dressing change. Cleanser: Wound Cleanser (Generic) 1 x Per Day/30 Days Discharge Instructions: Cleanse the wound with wound cleanser prior to applying a clean dressing using gauze sponges, not tissue or cotton balls. Topical: Gentamicin 1 x Per Day/30 Days Discharge Instructions: As directed by physician Topical: Mupirocin Ointment 1 x Per Day/30 Days Discharge Instructions: Apply Mupirocin (Bactroban) as instructed Topical: Ketoconazole Cream 2% 1 x Per Day/30 Days Discharge Instructions: Apply Ketoconazole as directed Prim Dressing: Maxorb Extra Ag+ Alginate Dressing, 4x4.75 (in/in) (Generic) 1 x Per Day/30 Days ary Discharge Instructions: Apply to wound bed as instructed Secondary Dressing: ABD Pad, 8x10 (Generic) 1 x Per Day/30 Days Discharge Instructions: Apply over primary dressing as directed. Secured With: 57M Medipore H Soft Cloth Surgical T ape, 4 x 10 (in/yd) (Generic) 1 x Per Day/30 Days Discharge Instructions: Secure with tape as directed. 06/01/2023: No significant change to the wound overall. The open areas are a  bit friable with some slough accumulation. There is some dry crusting on the skin, but underneath much of this, no wound is identified. I used a curette to debride slough, subcutaneous tissue, eschar, and dry skin from the wound area. We will continue the mixture of topical gentamicin and mupirocin with silver alginate. Unfortunately, due to chronic pain, he is unable to lie on his side and therefore is pretty much constantly lying on the wound surface. His skin is also extremely fragile so any transfers are a risk for shearing injury to the skin. We will continue to do the best we can in this situation, but much of this is largely palliative. Follow-up in 6 weeks. Electronic Signature(s) Signed: 06/01/2023 11:53:02 AM By: Duanne Guess MD FACS Previous Signature: 06/01/2023 11:17:40 AM Version By: Duanne Guess MD FACS  Entered By: Duanne Guess on 06/01/2023 11:53:02 -------------------------------------------------------------------------------- SuperBill Details Patient Name: Date of Service: [name] 06/01/2023 Medical Record Number: [id number] Patient Account Number: [id number] Date of Birth/Sex: Treating RN: Jan 05, 1951 (73 y.o. 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 Facility Procedures : CPT4 Code: [id number] Description: 11042 - DEB SUBQ TISSUE 20 SQ CM/< ICD-10 Diagnosis Description L89.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, stage 3 Modifier: Quantity: 1 : CPT4 Code: [id number] Description: 11045 - DEB SUBQ  TISS EA ADDL 20CM ICD-10 Diagnosis Description L89.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, stage 3 Modifier: Quantity: 8 Physician Procedures : CPT4 Code Description Modifier [id number] 99214 - WC PHYS LEVEL 4 - EST PT ICD-10 Diagnosis Description L89.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, 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 E66.01 Morbid (severe) obesity due to excess  calories G62.89 Other specified polyneuropathies Quantity: 1 : [id number] 11042 - WC PHYS SUBQ TISS 20 SQ CM ICD-10 Diagnosis Description L89.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, stage 3 Quantity: 1 : [id number] 11045 - WC PHYS SUBQ TISS EA ADDL 20 CM ICD-10 Diagnosis Description L89.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, stage 3 Quantity: 8 Electronic Signature(s) Signed: 06/01/2023 11:18:00 AM By: Duanne Guess MD FACS Entered By: Duanne Guess on 06/01/2023 11:18:00

## 2023-06-04 ENCOUNTER — Encounter: Payer: Self-pay | Admitting: Internal Medicine

## 2023-06-04 ENCOUNTER — Ambulatory Visit: Payer: Medicare Other | Attending: Internal Medicine | Admitting: Internal Medicine

## 2023-06-04 VITALS — BP 115/75 | HR 90

## 2023-06-04 DIAGNOSIS — I4821 Permanent atrial fibrillation: Secondary | ICD-10-CM

## 2023-06-04 DIAGNOSIS — E782 Mixed hyperlipidemia: Secondary | ICD-10-CM | POA: Diagnosis not present

## 2023-06-04 DIAGNOSIS — I5032 Chronic diastolic (congestive) heart failure: Secondary | ICD-10-CM | POA: Diagnosis not present

## 2023-06-04 NOTE — Patient Instructions (Signed)
Medication Instructions:  Your physician recommends that you continue on your current medications as directed. Please refer to the Current Medication list given to you today.    *If you need a refill on your cardiac medications before your next appointment, please call your pharmacy*   Lab Work: None    If you have labs (blood work) drawn today and your tests are completely normal, you will receive your results only by: MyChart Message (if you have MyChart) OR A paper copy in the mail If you have any lab test that is abnormal or we need to change your treatment, we will call you to review the results.   Testing/Procedures: None    Follow-Up: At [facility], you and your health needs are our priority.  As part of our continuing mission to provide you with exceptional heart care, we have created designated Provider Care Teams.  These Care Teams include your primary Cardiologist (physician) and Advanced Practice Providers (APPs -  Physician Assistants and Nurse Practitioners) who all work together to provide you with the care you need, when you need it.  We recommend signing up for the patient portal called "MyChart".  Sign up information is provided on this After Visit Summary.  MyChart is used to connect with patients for Virtual Visits (Telemedicine).  Patients are able to view lab/test results, encounter notes, upcoming appointments, etc.  Non-urgent messages can be sent to your provider as well.   To learn more about what you can do with MyChart, go to [email or web address].    Your next appointment:   1 year(s)  The format for your next appointment:   In Person  Provider:   Chrystie Nose, MD    Other Instructions

## 2023-06-04 NOTE — Progress Notes (Signed)
Virtual Visit via Video Note   This visit type was conducted due to national recommendations for restrictions regarding the COVID-19 Pandemic (e.g. social distancing) in an effort to limit this patient's exposure and mitigate transmission in our community.  Due to his co-morbid illnesses, this patient is at least at moderate risk for complications without adequate follow up.  This format is felt to be most appropriate for this patient at this time.  All issues noted in this document were discussed and addressed.  A limited physical exam was performed with this format.  Please refer to the patient's chart for his consent to telehealth for [facility].   Evaluation Performed:  Video visit  Date:  06/04/2023   ID:  [name], [name] August 06, 1950, MRN [id number]  Patient Location:  [address] [place] [address]  Provider location:   [address], [address] [place], [place] [address]  PCP:  Caffie Damme, MD  Cardiologist:  Chrystie Nose, MD Electrophysiologist:  None   Chief Complaint: Follow-up  History of Present Illness:    [name] is a 73 y.o. male who presents via audio/video conferencing for a telehealth visit today.  [name] was seen today via D[employer] video visit. He is chronically bedbound due to neuropathy. He has had problems with chronic leg edema which has improved. Recently he has noted an irregular heartbeat and an EKG by a home health doctor seems to have confirmed afib - he has known PAF on Pradaxa. Labs stable recently - he was noted to have elevated triglycerides and changed to a fibrate and fish oil. He denies chest pain, DOE or symptomatic atrial fibrillation.  11/21/2019  [name] was seen today for video visit.  This works very well to him because he is chronically bedbound due to neuropathy.  He has persistent lower extremity edema and atrial fibrillation which seems more longstanding persistent.  He is on Pradaxa.  In November he  had a UTI and was noted be tachycardic however that resolved again after treatment.  He is apparently prone to UTIs and has had several recently.  He currently has a UTI of her heart rate was 85 today and blood pressure is well controlled.  He continues to have care with doctors making house calls who does labs every few months.  The family reports no worsening anemia and normal renal function.  He denies any bleeding difficulty on Pradaxa.  His triglycerides have been elevated in 3-400 range however after being started on fish oils and fenofibrate are now down in the 200s.  12/27/2020  [name] is seen today in follow-up.  Overall he seems to be fairly stable.  Back in January he was in the ER with pretty extreme pain in his leg.  He was also noted to have a UTI which probably set that off.  He received pain medicine and antibiotic treatments.  Since then he has been doing pretty well.  He is probably in persistent A. fib.  Is not had an EKG since 2020 however his blood pressure cuff shows irregularities.  Blood pressure is well controlled.  PCP had checked his cholesterol recently which showed his numbers to be generally in range except for triglycerides however they were somewhat lower according to his wife.  05/27/2022  [name] is doing well.  He is essentially bedbound.  He is undergoing some wound care for sacral decubitus.  He has not had any recent hospitalizations.  He had lipids  performed in October which appear to be pretty good with an LDL particle number of 526.  Triglycerides were not tested but he remains on fish oil in addition to low-dose atorvastatin.  He is also on Pradaxa for history of DVT and A-fib.  He denies any symptomatic A-fib.  He has had no chest pain or worsening shortness of breath.  06/04/2023  [name] is seen today via video follow-up.  This format is work best for him as it is difficult for him to be seen in the office however he was recently seen by Marjie Skiff, PA-C in November.  This was for hospital follow-up.  During his hospitalization he was noted to be markedly volume overloaded requiring significant diuresis.  His weight after discharge was 309 pounds.  It seems to have stayed fairly stable although it is difficult to measure at home.  According his wife he is not gaining any fluid and generally seems to be breathing pretty well.  He has required oxygen at home.  He is in permanent atrial fibrillation at this time and he remains on Pradaxa.  He was seen in the A-fib clinic for this.  Rate control was recommended.  Prior CV studies:   The following studies were reviewed today:  Chart reviewed  PMHx:  Past Medical History:  Diagnosis Date   Arthritis    Atrial fibrillation (HCC)    a. Dx 05/2014 in setting of seizure, PNA. S/p TEE/DCCV.   Bowel perforation (HCC)    a. transvere and R colectomy and ileostomy in 12/25/13.   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   Psoriasis    Seizures (HCC)    Venous stasis    Weakness of both legs     Past Surgical History:  Procedure Laterality Date   ANKLE FRACTURE SURGERY  09/04/13   CARDIOVERSION N/A 05/24/2014   Procedure: CARDIOVERSION;  Surgeon: Wendall Stade, MD;  Location: [facility] ENDOSCOPY;  Service: Cardiovascular;  Laterality: N/A;   COLONOSCOPY  03/26/2012   Procedure: COLONOSCOPY;  Surgeon: Theda Belfast, MD;  Location: [facility] ENDOSCOPY;  Service: Endoscopy;  Laterality: N/A;   COLOSTOMY  12-25-13   Ruptured colon   FLEXIBLE SIGMOIDOSCOPY N/A 12/09/2013   Procedure: FLEXIBLE SIGMOIDOSCOPY;   Surgeon: Theda Belfast, MD;  Location: [facility] ENDOSCOPY;  Service: Endoscopy;  Laterality: N/A;   LAPAROTOMY N/A 12/25/2013   Procedure: EXPLORATORY LAPAROTOMY/Transverse and right coloectomy/ end  ileostomy and mucus  fistula;  Surgeon: Axel Filler, MD;  Location[facility] OR;  Service: General;  Laterality: N/A;   TEE WITHOUT CARDIOVERSION N/A 05/24/2014   Procedure: TRANSESOPHAGEAL ECHOCARDIOGRAM (TEE);  Surgeon: Wendall Stade, MD;  Location[facility] ENDOSCOPY;  Service: Cardiovascular;  Laterality: N/A;   TONSILLECTOMY     as child    FAMHx:  Family History  Problem Relation Age of Onset   Vision loss Father    Hypertension Father    Diabetes Father    Heart disease Father    Cancer Father  colon   Diabetes Mother    Hypertension Mother    Heart disease Mother    Dementia Mother    Cancer Mother        breast ca   Diabetes Sister    Hyperlipidemia Sister    Hypertension Sister    Diabetes Brother    Kidney disease Brother    Heart disease Brother     SOCHx:   reports that he quit smoking about 9 years ago. His smoking use included pipe. He has never used smokeless tobacco. He reports that he does not drink alcohol and does not use drugs.  ALLERGIES:  Allergies  Allergen Reactions   Penicillins Other (See Comments)    Pt has no personal reaction to this med, but his mother/brother do. Has patient had a PCN reaction causing immediate rash, facial/tongue/throat swelling, SOB or lightheadedness with hypotension: No Has patient had a PCN reaction causing severe rash involving mucus membranes or skin necrosis: No Has patient had a PCN reaction that required hospitalization No Has patient had a PCN reaction occurring within the last 10 years: No If all of the above answers are "NO", then may proceed with Cephalosporin    MEDS:  Current Meds  Medication Sig   buprenorphine (BUTRANS) 7.5 MCG/HR Place 1 patch onto the skin once a week.     ROS: Pertinent items noted in HPI  and remainder of comprehensive ROS otherwise negative.  Labs/Other Tests and Data Reviewed:    Recent Labs: 03/11/2023: Magnesium 2.1 03/24/2023: BNP 19.0 04/10/2023: ALT 13; BUN 21; Creatinine, Ser 0.68; Hemoglobin 13.3; Platelets 268; Potassium 5.0; Sodium 136   Recent Lipid Panel Lab Results  Component Value Date/Time   CHOL 136 10/17/2013 12:00 AM   TRIG 65 10/17/2013 12:00 AM   HDL 45 10/17/2013 12:00 AM   LDLCALC 78 10/17/2013 12:00 AM    Wt Readings from Last 3 Encounters:  04/10/23 (!) 309 lb (140.2 kg)  03/24/23 (!) 309 lb (140.2 kg)  03/11/23 (!) 309 lb (140.2 kg)     Exam:    Vital Signs:  BP 115/75 (BP Location: Right Arm, Patient Position: Sitting, Cuff Size: Normal)   Pulse 90   SpO2 95%    General appearance: alert, no distress, and moderately obese Lungs: no visual respiratory difficulty Extremities: extremities normal, atraumatic, no cyanosis or edema Skin: Skin color, texture, turgor normal. No rashes or lesions Neurologic: Grossly normal  ASSESSMENT & PLAN:    Permanent atrial fibrillation, CHADSVASC score of 3 HFpEF History of DVT on Pradaxa Nonambulatory Dyslipidemia -specifically high triglycerides Bilateral lower extremity venous stasis edema  [name] is relatively stable after being hospitalized about 2 months ago with acute on chronic heart failure with preserved ejection fraction.  His discharge weight was 309 pounds and they are trying to monitor that to make sure it has remained stable.  He is in permanent A-fib now.  He is requiring oxygen.  No bleeding issues on Pradaxa.  He gets labs every few months.  Renal function is normal and his hemoglobin is improved somewhat.  No changes in his medicines today.  He is advised to monitor for fluid gain or weight gain over a few days.  Plan follow-up with [place] annually or sooner if needed.  Patient Risk:   After full review of this patients clinical status, I feel that they are at least moderate  risk at this time.  Time:   Today, I have spent 25 minutes with the patient with  telehealth technology discussing PAF, HTN, dyslipidemia, edema.     Medication Adjustments/Labs and Tests Ordered: Current medicines are reviewed at length with the patient today.  Concerns regarding medicines are outlined above.   Tests Ordered: No orders of the defined types were placed in this encounter.    Medication Changes: No orders of the defined types were placed in this encounter.    Disposition:  in 1 year(s)  Chrystie Nose, MD, [facility], FACP  [place]  [facility]  Medical Director of the Advanced Lipid Disorders &  Cardiovascular Risk Reduction Clinic Diplomate of the American Board of Clinical Lipidology Attending Cardiologist  Direct Dial: [phone]  Fax: [phone]  Website:  [email or web address]  Chrystie Nose, MD  06/04/2023 9:01 AM

## 2023-07-01 ENCOUNTER — Other Ambulatory Visit: Payer: Self-pay | Admitting: Internal Medicine

## 2023-07-13 ENCOUNTER — Encounter (HOSPITAL_BASED_OUTPATIENT_CLINIC_OR_DEPARTMENT_OTHER): Payer: Medicare Other | Attending: General Surgery | Admitting: General Surgery

## 2023-07-13 DIAGNOSIS — G6289 Other specified polyneuropathies: Secondary | ICD-10-CM | POA: Insufficient documentation

## 2023-07-13 DIAGNOSIS — Z6837 Body mass index (BMI) 37.0-37.9, adult: Secondary | ICD-10-CM | POA: Insufficient documentation

## 2023-07-13 DIAGNOSIS — E11622 Type 2 diabetes mellitus with other skin ulcer: Secondary | ICD-10-CM | POA: Insufficient documentation

## 2023-07-13 DIAGNOSIS — Z932 Ileostomy status: Secondary | ICD-10-CM | POA: Diagnosis not present

## 2023-07-13 DIAGNOSIS — L89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, stage 3: Secondary | ICD-10-CM | POA: Insufficient documentation

## 2023-07-15 ENCOUNTER — Emergency Department (HOSPITAL_COMMUNITY)

## 2023-07-15 ENCOUNTER — Emergency Department (HOSPITAL_COMMUNITY)
Admission: EM | Admit: 2023-07-15 | Discharge: 2023-07-16 | Disposition: A | Discharge status: Home / Self Care | Attending: Emergency Medicine | Admitting: Emergency Medicine

## 2023-07-15 DIAGNOSIS — R531 Weakness: Secondary | ICD-10-CM | POA: Diagnosis present

## 2023-07-15 DIAGNOSIS — R600 Localized edema: Secondary | ICD-10-CM | POA: Diagnosis not present

## 2023-07-15 DIAGNOSIS — I10 Essential (primary) hypertension: Secondary | ICD-10-CM | POA: Diagnosis not present

## 2023-07-15 DIAGNOSIS — R239 Unspecified skin changes: Secondary | ICD-10-CM | POA: Insufficient documentation

## 2023-07-15 DIAGNOSIS — Z79899 Other long term (current) drug therapy: Secondary | ICD-10-CM | POA: Insufficient documentation

## 2023-07-15 DIAGNOSIS — N3001 Acute cystitis with hematuria: Secondary | ICD-10-CM

## 2023-07-15 LAB — CBC WITH DIFFERENTIAL/PLATELET
Abs Immature Granulocytes: 0.04 10*3/uL (ref 0.00–0.07)
Basophils Absolute: 0 10*3/uL (ref 0.0–0.1)
Basophils Relative: 0 %
Eosinophils Absolute: 0.2 10*3/uL (ref 0.0–0.5)
Eosinophils Relative: 2 %
HCT: 41.4 % (ref 39.0–52.0)
Hemoglobin: 12.8 g/dL — ABNORMAL LOW (ref 13.0–17.0)
Immature Granulocytes: 0 %
Lymphocytes Relative: 2 %
Lymphs Abs: 0.2 10*3/uL — ABNORMAL LOW (ref 0.7–4.0)
MCH: 29.6 pg (ref 26.0–34.0)
MCHC: 30.9 g/dL (ref 30.0–36.0)
MCV: 95.6 fL (ref 80.0–100.0)
Monocytes Absolute: 0.6 10*3/uL (ref 0.1–1.0)
Monocytes Relative: 6 %
Neutro Abs: 9 10*3/uL — ABNORMAL HIGH (ref 1.7–7.7)
Neutrophils Relative %: 90 %
Platelets: 213 10*3/uL (ref 150–400)
RBC: 4.33 MIL/uL (ref 4.22–5.81)
RDW: 14.7 % (ref 11.5–15.5)
WBC: 10 10*3/uL (ref 4.0–10.5)
nRBC: 0 % (ref 0.0–0.2)

## 2023-07-15 LAB — URINALYSIS, W/ REFLEX TO CULTURE (INFECTION SUSPECTED)
Bacteria, UA: NONE SEEN
Bilirubin Urine: NEGATIVE
Glucose, UA: NEGATIVE mg/dL
Ketones, ur: 5 mg/dL — AB
Nitrite: NEGATIVE
Protein, ur: 100 mg/dL — AB
RBC / HPF: >50 RBC/hpf (ref 0–5)
Specific Gravity, Urine: 1.019 (ref 1.005–1.030)
WBC, UA: >50 WBC/hpf (ref 0–5)
pH: 5 (ref 5.0–8.0)

## 2023-07-15 LAB — COMPREHENSIVE METABOLIC PANEL
ALT: 13 U/L (ref 0–44)
AST: 23 U/L (ref 15–41)
Albumin: 3.3 g/dL — ABNORMAL LOW (ref 3.5–5.0)
Alkaline Phosphatase: 36 U/L — ABNORMAL LOW (ref 38–126)
Anion gap: 10 (ref 5–15)
BUN: 22 mg/dL (ref 8–23)
CO2: 28 mmol/L (ref 22–32)
Calcium: 8.8 mg/dL — ABNORMAL LOW (ref 8.9–10.3)
Chloride: 95 mmol/L — ABNORMAL LOW (ref 98–111)
Creatinine, Ser: 0.95 mg/dL (ref 0.61–1.24)
GFR, Estimated: >60 mL/min (ref >60)
Glucose, Bld: 218 mg/dL — ABNORMAL HIGH (ref 70–99)
Potassium: 4.8 mmol/L (ref 3.5–5.1)
Sodium: 133 mmol/L — ABNORMAL LOW (ref 135–145)
Total Bilirubin: 0.6 mg/dL (ref 0.0–1.2)
Total Protein: 7.3 g/dL (ref 6.5–8.1)

## 2023-07-15 LAB — I-STAT CG4 LACTIC ACID, ED
Lactic Acid, Venous: 2.5 mmol/L (ref 0.5–1.9)
Lactic Acid, Venous: 2.8 mmol/L (ref 0.5–1.9)

## 2023-07-15 LAB — AMMONIA: Ammonia: 44 umol/L — ABNORMAL HIGH (ref 9–35)

## 2023-07-15 LAB — TROPONIN I (HIGH SENSITIVITY)
Troponin I (High Sensitivity): 12 ng/L (ref <18)
Troponin I (High Sensitivity): 11 ng/L (ref <18)

## 2023-07-15 LAB — LACTIC ACID, PLASMA: Lactic Acid, Venous: 2.3 mmol/L (ref 0.5–1.9)

## 2023-07-15 MED ORDER — OXYCODONE-ACETAMINOPHEN 5-325 MG PO TABS
2.0000 | ORAL_TABLET | Freq: Once | ORAL | Status: AC
  Administered 2023-07-15: 2 via ORAL
  Filled 2023-07-15: qty 2

## 2023-07-15 MED ORDER — LACTATED RINGERS IV BOLUS
500.0000 mL | Freq: Once | INTRAVENOUS | Status: AC
  Administered 2023-07-15: 500 mL via INTRAVENOUS

## 2023-07-15 NOTE — Discharge Instructions (Addendum)
 Continue the antibiotic you are currently on.  A culture was done so if something grows back and you need something different they will call you.  If you start having vomiting, abdominal pain confusion, high fever, difficulty waking up or other concerns return to the emergency room.

## 2023-07-15 NOTE — ED Triage Notes (Signed)
 Pt arrives via EMS from home with UTI symptoms x several days and new generalized weakness and difficulty following commands that started today. Took home UTI test, came back positive and started nitrofurantoin yesterday.  Hx bedbound, afib  CBG 265

## 2023-07-15 NOTE — ED Provider Notes (Signed)
 [place] EMERGENCY DEPARTMENT AT [facility] Provider Note   CSN: [id number] Arrival date & time: 07/15/23  8469     History  No chief complaint on file.   [name] is a 73 y.o. male.  Pt is a 73 y.o. 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  His sister reports they called the doctor and they started him on an antibiotic that starts with an "F" and he has had 3 doses.  However today his hands were shaking so badly he was having a hard time feeding himself which she states is very unusual and he usually reads all the time but today he could not read for more than a few minutes.  He has not had any vomiting.  This morning he was coughing but she reports that he has not since that time.  He denies any chest pain, abdominal pain or shortness of breath.  His sister reports that his legs are looking better and swelling is improving.  He does have a wound on his buttocks which wound care is seeing him for regularly and that is also improving.  Other than the antibiotic he has had no recent medication changes.  He has no history of liver disease and does not use alcohol.  The history is provided by the patient and a caregiver (pt's sister).       Home Medications Prior to Admission medications   Medication Sig Start Date End Date Taking? Authorizing Provider  acetaminophen (TYLENOL) 500 MG tablet Take 500-1,000 mg by mouth every 6 (six) hours as needed for mild pain, moderate pain, fever or headache.    [provider]  Alpha-Lipoic Acid 600 MG TABS Take 1 tablet by mouth  daily.    [provider]  atorvastatin (LIPITOR) 10 MG tablet Take 10 mg by mouth at bedtime. 12/13/20   [provider]  buprenorphine (BUTRANS) 7.5 MCG/HR Place 1 patch onto the skin once a week. 06/04/23   Hilty, Lisette Abu, MD  Calcium Carbonate-Vitamin D 600-400 MG-UNIT tablet Take 1 tablet by mouth daily.    [provider]  Cholecalciferol (VITAMIN D3 PO) Take 1 capsule by mouth daily.    [provider]  diltiazem (CARDIZEM CD) 360 MG 24 hr capsule TAKE 1 CAPSULE BY MOUTH EVERY DAY 04/21/23   Marjie Skiff E, PA-C  doxepin (SINEQUAN) 75 MG capsule Take 150 mg by mouth at bedtime.     [provider]  DULoxetine (CYMBALTA) 60 MG capsule Take 60 mg by mouth daily.    [provider]  fenofibrate 160 MG tablet Take 160 mg by mouth daily.    [provider]  Ferrous Sulfate (IRON PO) Take 1 tablet by mouth daily.    [provider]  gabapentin (NEURONTIN) 800 MG tablet Take 800 mg by mouth 3 (three) times daily. 09/11/22   [provider]  glipiZIDE (GLUCOTROL XL) 2.5 MG 24 hr tablet Take 2.5 mg by mouth in the morning and at bedtime.  05/16/17   [provider]  Lactobacillus (PROBIOTIC ACIDOPHILUS PO) Take 1 tablet by mouth daily.    [provider]  lamoTRIgine (LAMICTAL) 150 MG tablet Take 1 tablet (150 mg total) by mouth 2 (two) times daily. 05/12/23   Dohmeier, Porfirio Mylar, MD  levETIRAcetam (KEPPRA) 750 MG tablet Take 2 tablets (1,500 mg total) by mouth 2 (two) times daily. 05/08/23 06/07/23  Dohmeier, Porfirio Mylar, MD  metFORMIN (GLUCOPHAGE) 1000 MG tablet Take 1,000 mg by mouth 2 (two) times daily. 12/18/20   [provider]  metoprolol succinate (TOPROL-XL) 50 MG 24 hr tablet TAKE 1 TABLET BY MOUTH DAILY. TAKE WITH OR IMMEDIATELY FOLLOWING A MEAL. 04/21/23   Corrin Parker, PA-C  Multiple Vitamin (MULTIVITAMIN WITH MINERALS) TABS Take 1 tablet by mouth daily.    [provider]   naloxone [facility]) nasal spray 4 mg/0.1 mL Place 1 spray into the nose once. 08/29/20   [provider]  Omega-3 Fatty Acids (FISH OIL) 1000 MG CAPS Take 1,000 mg by mouth 2 (two) times a day.    [provider]  Lum Babe test strip SMARTSIG:1 Strip(s) Via Meter PRN 02/20/23   [provider]  OVER THE COUNTER MEDICATION Apply 1 Application topically See admin instructions. Apply Silver Alginate Dressing to Slow healing wound    [provider]  oxyCODONE-acetaminophen (PERCOCET) 10-325 MG tablet Take 1 tablet by mouth. 02/13/23   [provider]  PRADAXA 150 MG CAPS capsule TAKE 1 CAPSULE BY MOUTH TWICE A DAY 12/29/22   Hilty, Lisette Abu, MD  spironolactone (ALDACTONE) 25 MG tablet TAKE 1 TABLET (25 MG TOTAL) BY MOUTH DAILY. 04/21/23   Marjie Skiff E, PA-C  Torsemide 40 MG TABS Take 40 mg by mouth in the morning and at bedtime. 03/26/23   Corrin Parker, PA-C      Allergies    Penicillins    Review of Systems   Review of Systems  Physical Exam Updated Vital Signs BP 100/84   Pulse (!) 45   Temp 98.4 F (36.9 C) (Oral)   Resp 18   SpO2 94%  Physical Exam Vitals and nursing note reviewed.  Constitutional:      General: He is not in acute distress.    Appearance: He is well-developed.  HENT:     Head: Normocephalic and atraumatic.  Eyes:     Conjunctiva/sclera: Conjunctivae normal.     Pupils: Pupils are equal, round, and reactive to light.  Cardiovascular:     Rate and Rhythm: Normal rate. Rhythm irregularly irregular.     Heart sounds: No murmur heard. Pulmonary:     Effort: Pulmonary effort is normal. No respiratory distress.     Breath sounds: Normal breath sounds. No wheezing or rales.     Comments: Breath sounds decreased due to patient's body habitus and the way he is sitting in the bed Abdominal:     General: There is no distension.     Palpations: Abdomen is soft.     Tenderness: There is no abdominal  tenderness. There is no guarding or rebound.     Comments: Ostomy present in the right abdomen with stool in the bag  Musculoskeletal:        General: No tenderness. Normal range of motion.     Cervical back: Normal range of motion and neck supple.     Comments: Bilateral lower extremities with mild edema and chronic skin changes but no open or draining wounds on the lower extremities  Skin:    General: Skin is warm and dry.     Findings: No erythema or rash.  Neurological:  Mental Status: He is alert.     Comments: Patient is awake and interactive but does not appear to be understanding questions well or answering them correctly.  He can follow commands  Psychiatric:        Behavior: Behavior normal.     ED Results / Procedures / Treatments   Labs (all labs ordered are listed, but only abnormal results are displayed) Labs Reviewed  AMMONIA - Abnormal; Notable for the following components:      Result Value   Ammonia 44 (*)    All other components within normal limits  CBC WITH DIFFERENTIAL/PLATELET - Abnormal; Notable for the following components:   Hemoglobin 12.8 (*)    Neutro Abs 9.0 (*)    Lymphs Abs 0.2 (*)    All other components within normal limits  COMPREHENSIVE METABOLIC PANEL - Abnormal; Notable for the following components:   Sodium 133 (*)    Chloride 95 (*)    Glucose, Bld 218 (*)    Calcium 8.8 (*)    Albumin 3.3 (*)    Alkaline Phosphatase 36 (*)    All other components within normal limits  URINALYSIS, W/ REFLEX TO CULTURE (INFECTION SUSPECTED) - Abnormal; Notable for the following components:   Color, Urine AMBER (*)    APPearance CLOUDY (*)    Hgb urine dipstick LARGE (*)    Ketones, ur 5 (*)    Protein, ur 100 (*)    Leukocytes,Ua LARGE (*)    All other components within normal limits  I-STAT CG4 LACTIC ACID, ED - Abnormal; Notable for the following components:   Lactic Acid, Venous 2.5 (*)    All other components within normal limits  I-STAT  CG4 LACTIC ACID, ED - Abnormal; Notable for the following components:   Lactic Acid, Venous 2.8 (*)    All other components within normal limits  URINE CULTURE  LACTIC ACID, PLASMA  TROPONIN I (HIGH SENSITIVITY)  TROPONIN I (HIGH SENSITIVITY)    EKG None ED ECG REPORT   Date: 07/15/2023  Rate: 98  Rhythm: atrial fibrillation  QRS Axis: normal  Intervals: normal  ST/T Wave abnormalities: nonspecific T wave changes  Conduction Disutrbances:none  Narrative Interpretation:   Old EKG Reviewed: unchanged  I have personally reviewed the EKG tracing and agree with the computerized printout as noted.  Radiology DG Chest 2 View Result Date: 07/15/2023 CLINICAL DATA:  Weakness EXAM: CHEST - 2 VIEW COMPARISON:  02/16/2023 FINDINGS: Cardiac shadow is enlarged. Lungs are well aerated bilaterally. Central vascular congestion is noted without significant edema. No bony abnormality is noted. IMPRESSION: CHF without significant edema. Electronically Signed   By: Alcide Clever M.D.   On: 07/15/2023 23:02    Procedures Procedures    Medications Ordered in ED Medications  lactated ringers bolus 500 mL (has no administration in time range)  lactated ringers bolus 500 mL (0 mLs Intravenous Stopped 07/15/23 2226)  oxyCODONE-acetaminophen (PERCOCET/ROXICET) 5-325 MG per tablet 2 tablet (2 tablets Oral Given 07/15/23 2222)    ED Course/ Medical Decision Making/ A&P                                 Medical Decision Making Amount and/or Complexity of Data Reviewed Labs: ordered. Radiology: ordered.  Risk Prescription drug management.   Pt with multiple medical problems and comorbidities and presenting today with a complaint that caries a high risk for morbidity and mortality.  Here today  with above symptoms and concern for possible UTI, AKI, cardiac etiology, encephalopathy.  Lower suspicion for fluid overload at this time or stroke.  He has not had a fall.  He does have history of wounds which I  am not able to evaluate at this time given the location of the patient but his sister reports wound care is coming and the wound is been looking better.  He has had 3 doses of an antibiotic but it is not known what this is because his sister left it at home and she is unable to get access to it online and I do not have access to it either.  Labs, EKG and imaging are pending.  11:06 PM I independently interpreted patient's labs and EKG.  EKG shows atrial fibrillation without other acute changes similar to prior EKGs.  Lactic acid is elevated at 2.5 today, ammonia 44, CBC with normal white count and stable hemoglobin of 12.8, CMP with normal renal function and electrolytes within normal anion gap, troponin is normal, UA with large leukocytes, greater than 50 red cells white cells but no bacteria seen.  When looking back up multiple urine samples over the last year this looks very similar to his prior urines which have all grown out multiple species.  2 years ago patient grew out Proteus which was susceptible to a cephalosporin. I have independently visualized and interpreted pt's images today.  Chest x-ray without acute findings today. Repeat lactic is elevated at 2.8 after fluids however pt is well appearing.  Both were drawn off the IV where he is getting LR.   Will get a fresh stick and give the remainder of the 1L.  Discussed these results with the patient and his sister.  They would like to go home and continue his oral antibiotic which his sister now was able to find which is Macrobid.          Final Clinical Impression(s) / ED Diagnoses Final diagnoses:  None    Rx / DC Orders ED Discharge Orders     None         Gwyneth Sprout, MD 07/15/23 2308

## 2023-07-16 NOTE — ED Provider Notes (Signed)
  Physical Exam  BP 100/84   Pulse (!) 45   Temp 98 F (36.7 C) (Oral)   Resp 18   SpO2 94%   Physical Exam  Procedures  Procedures  ED Course / MDM    Medical Decision Making Amount and/or Complexity of Data Reviewed Labs: ordered. Radiology: ordered.  Risk Prescription drug management.   [name], patient.  Is a 73 year old male presents emergency department for UTI symptoms.  Patient was signed out pending repeat lactic acid.  Patient's lactic acid is downtrending.  Appears healthy.  He is tolerating p.o. well.  Patient and patient's family want to go home.  Return precautions discussed with patient during discharge.       Bruce Simmonds [name], [name] 07/16/23 0002

## 2023-07-17 LAB — URINE CULTURE: Culture: 10000 — AB

## 2023-07-21 NOTE — Progress Notes (Unsigned)
 PATIENT: [name] DOB: 02/24/1951  REASON FOR VISIT: follow up HISTORY FROM: patient  Virtual Visit via Telephone Note  I connected with [name] on 07/21/23 at  9:30 AM EDT by telephone and verified that I am speaking with the correct person using two identifiers.   I discussed the limitations, risks, security and privacy concerns of performing an evaluation and management service by telephone and the availability of in person appointments. I also discussed with the patient that there may be a patient responsible charge related to this service. The patient expressed understanding and agreed to proceed.   History of Present Illness:  07/21/23 ALL (Mychart):  Carle returns for follow up for seizures and polyneuropathy. He was last seen via Mychart video with Dr [name] 02/2021 and 02/2022 and stable. He reported more absence seizures during period of urosepsis early 2024. Lamotrigine 150mg  and levetiracetam 1500mg  BID continued. Since,   He continues close follow up with [facility] for pain management. Now taking gabapentin 800mg  TID. Also taking duloxetine and oxycodone. He was recently treated for recurrent cystitis. Labs reviewed in Epic.   08/02/2020 ALL (Mychart): He returns for follow up for seizures and polyneuropathy. He is doing about the same. No significant changes. No seizures. He continues to have waxing and waning pain. Mostly of groin. Gabapentin 600/600/1200 helps some. he is tolerating lamotrigine and levetiracetam. He has an appt with pain management at [facility] 08/29/20.   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 Immobile for over 15 years-  former patient of Dr.Love. he is fragile and has to come by ambulance to each outpatient visit.    04/26/2019 ALL (Mychart):  [name] is a 73 y.o. male here today for follow up for seizures and neuropathy. He continues Lamictal 150mg  twice daily as well as levetiracetam 1500mg  twice daily.  Neuropathy stable on gabapentin 600mg  in am and 1200mg  at bedtime.  He denies any seizure activity since last being seen.  He is currently being followed by [facility] as he is completely bedridden.  Recent lab work has been reviewed for today's visit and is appropriate.    History (copied from Dr Dohmeier's note on 11/03/2018)  Interval history from 11-03-2018: [name] has been a longtime established patient at [facility] and was originally followed by Dr. [name].  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  He suffers from paroxysmal atrial fibrillation, he is meanwhile immobile and he developed a seizure disorder which has been treated and controlled.  The patient lives in the private home with his sister who is his main caregiver.  For several years he did not have a primary care physician but now is established with Dr. Rhunette Croft, MD at [facility] He underwent recently as a new patient to complete the work-up which included the level of his antiseizure medication including Keppra and Neurontin, vitamin levels and an EKG.  He remains having paroxysmal atrial fibrillation and sees Dr. Rennis Golden.  His Keppra level so I was informed was 24, he was told to hydrate better and drink a lot of water, he was also told to start a vitamin D supplement and discontinue vitamin B12.   The patient is well-groomed alert oriented and conversational today he keeps adjusting his bed when he is in pain trying to find a more comfortable position but overall his pain level has been the same for many years and his degree of disability  has not progressed recently.  He is nonambulatory.  He does have just recovered from a sinusitis and has a little nasal speech but he is not hoarse, and he does not have trouble swallowing he reports. There is no diplopia no facial asymmetry seen.   HISTORY OF PRESENT ILLNESS: CD-02-2017 Mr [name] is a 73 year old male  patient with a progressive neuropathy that left him unable to ambulate and now is bedridden. He has seizures- and none recently. Possibly one absence seizure 6 month ago. He has a lot of re activated psoriasis. Hydrocodone helped his pain and jerkiness- he no longer takes Lyrica, and he now takes OTC supplements. He uses Magnilife - a magnesium based supplement that helps his constipation as well. He has a Tax adviser.  Mr. Apo neuropathy and is becoming dependent on his stretcher followed by an abdominal surgery actually a peritonitis is ruptured colon.  My colleagues at [facility] were kind enough to evaluate Mr. Balles and thought that this reflects a Dilantin induced polyneuropathy but this should actually improve and the patient now has not been on Dilantin for about 12 months and there has been no improvement noted neither and motor no sensory function. 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    MM- [name] is a 73 year old male with a history of seizures and neuropathy .  He returns today for an evaluation.  His sister is with him today.  She reports that he may have an Absence seizures every 3-4 months.  She states that he just zones out when they are talking to him.  He remains on Lamictal and Keppra and is tolerating that well.  He is now off of Dilantin.  He continues to have neuropathy pain particularly in the groin area.  The patient is bedridden.  He is unable to sit up without significant discomfort.  He reports that Cymbalta helps him with his neuropathy does not help much with the pain.  He is also on gabapentin and Lyrica.  Lyrica is prescribed by another provider.  He is unsure that these medications are offering him much benefit.  At the last visit Dr. [name] gave him a prescription of hydrocodone and he reports that this is offered him the most benefit.   He is requesting to take the medication twice a day versus once a day.  He returns today  for an evaluation.  HISTORY see Mr. JHETT FRETWELL today, a 73 year old Caucasian gentleman with advanced muscular degeneration, he is presenting on a stretcher. He as years ago lost all his deep tendon reflexes, has often pain at the joint especially in the lower extremities at the ankle on the right for example. Muscle atrophy has caused him to have flaccid muscles. Deep tendon reflexes also missing from the upper extremities. An appointment with neuromuscular consultant is requested - this patient has no diagnosis for the cause of his disability.  Begun in lower extremities , first cane than walker , 2012 . Since than progressed to stretcher. Has a now a colostomy, has no core strength, has some residual foot and arm movement. DVTs from immobility.     He continues to present also as a seizure patient who also presents with a seizure disorder, manifesting as absence seizures, not GTC. He has been on Dilantin for a long time but my goal is to wean him slowly off and exchange with another seizure medication. He also presents today with a really high blood pressure.The patient had  been started on Lamictal 100 mg twice a day and his sister confirms that his spells seem to have decreased in frequency. He remains on Keppra, which I will also decrease to 1500 mg bid from tid dosing.  I have increased his Lamictal today 250 mg twice a day and I am starting to wean him off Dilantin 30 days on 200 mg, followed by another 30 days on 100 mg at night only then discontinue. The goal is to eliminate Dilantin as a contributor to neuropathy. He will have blood drawn today for CMET, CBC,  dilantin level .  The pain specialist added that he had nothing to offer above or beyond what she is already on.  He has followed up with several pain specialists  was prescribed hydrocodone APAP and this helps him at night with the pain - this far the very best.  Since his sister manages the pain medication for him I think that the  occasional hydrocodone APAP at night would out weigh the risks associated with the patient ( he is immobile, has a history of somewhat decreased respirations, morbid obesity, sleep apnea) but is in believable pain. .    Interval history on 07-05-14. Mr. Lalone was admitted in status epilepticus on 05/17/2014 to the [facility] and followed by Dr. Elvera Lennox,   He underwent multiple multiple brain scans which did not show an intracranial abnormality it was also noted that he was unable to swallow any medications during his status and therefore received Dilantin and Keppra intravenously. He was intubated for a couple of days. The status epilepticus was broken by being given IV medications. He is back on Dilantin and I have increased that in his last visit to 400 mg at night instead of alternating 300 with 400 we will resume this today. I refill his Keppra today as well as his Neurontin which I further increased by 600 mg. Neurontin also an antiepileptic medication is used in his case to treat his significant sensory abnormalities related to a neuropathy. What is remarkable to me is that the patient has almost no control over his lower extremities and lost all muscle tone by his upper extremities provide good grip strength and normal movement and tone. A nerve conduction study and EMG with Dr. Anne Hahn just documented neuropathy. The admission to the hospital was precipitated by developing pneumonia. The CT scan of the head was reviewed and the report is copied to today's note. I have discussed with the patient today that I would like for him to undergo a thoracic spine MRI if possible his last brain MRI by my records is from 2004, ordered by Dr. Anne Hahn at the time. In addition I do not need a brain MRI at this point I think that it is clear that the pneumonia cost him to have the seizures which is not an unknown or uncommon trigger. Any infection below the seizure threshold. What I would like to research  further why his lower extremities are disproportionately affected by a neuropathy.   Interval history from 10-03-14. She no longer has physical therapy provided through [facility] payment, he and his sister are using some weight exercises at home. He has also healed from this macerated skin near the colostomy,. A honey-based product has been helpful in establishing better nor skin. Dr. Warrick Parisian thousand patient and exchanged his nocturnal time dose of gabapentin with doxepin. This helped him to sleep. Does not help the pain. He also has a rectal pain pulled endorsed neuropathy. I  would like for Dr. Warrick Parisian to have another visit with the patient also wondered if she  couldperform any kind of study on the pudendal nerve?.CD   Interval history from 10/02/2015, Mr. Ponder has not had any significant seizure activity over the last 12 months at least. His main problem is that due to his immobility and venostasis he has painful neuropathy, venostasis and leg ulcers.   Observations/Objective:  Generalized: Well developed, in no acute distress, bedridden  Mentation: Alert oriented to time, place, history taking. Follows all commands speech and language fluent   Assessment and Plan:  73 y.o. 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 here with    ICD-10-CM   1. 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 Recurrent UTI  N39.0        Overall, Mr. Kinney is doing fairly well.  Seizures are very well managed on current regimen of  Lamictal 150 mg twice daily and levetiracetam 1500 mg twice daily.  He continues gabapentin 600 mg in the a.m., 600 at lunch and 1200 mg at bedtime with some improvement in neuropathy pain.  He will continue close follow-up with his primary care provider.  Activity as tolerated.  He will follow-up with [place] in 6 months, sooner if needed.  He verbalizes understanding and agreement with this plan.  No orders of the defined types were placed in this encounter.   No orders of the defined types were placed in this encounter.    Follow Up Instructions:  I discussed the assessment and treatment plan with the patient. The patient was provided an opportunity to ask questions and all were answered. The patient agreed with the plan and demonstrated an understanding of the instructions.   The patient was advised to call back or seek an in-person evaluation if the symptoms worsen or if the condition fails to improve as anticipated.  I provided 15 minutes of non-face-to-face time during this encounter.  Patient is located at his place of residence.  His sister aids in history.  Provider is in the office.   Shawnie Dapper, NP   Made any corrections needed, and agree with history, physical, neuro exam,assessment and plan as stated.     Naomie Dean, MD [facility]

## 2023-07-21 NOTE — Patient Instructions (Signed)
 Below is our plan:  We will continue lamotrigine 150mg  and levetiracetam 1500mg  twice daily   Please make sure you are consistent with timing of seizure medication. I recommend annual visit with primary care provider (PCP) for complete physical and routine blood work. I recommend daily intake of vitamin D (400-800iu) and calcium (800-1000mg ) for bone health. Discuss Dexa screening with PCP.   According to [place] law, you can not drive unless you are seizure / syncope free for at least 6 months and under physician's care.  Please maintain precautions. Do not participate in activities where a loss of awareness could harm you or someone else. No swimming alone, no tub bathing, no hot tubs, no driving, no operating motorized vehicles (cars, ATVs, motocycles, etc), lawnmowers, power tools or firearms. No standing at heights, such as rooftops, ladders or stairs. Avoid hot objects such as stoves, heaters, open fires. Wear a helmet when riding a bicycle, scooter, skateboard, etc. and avoid areas of traffic. Set your water heater to 120 degrees or less.  SUDEP is the sudden, unexpected death of someone with epilepsy, who was otherwise healthy. In SUDEP cases, no other cause of death is found when an autopsy is done. Each year, more than 1 in 1,000 people with epilepsy die from SUDEP. This is the leading cause of death in people with uncontrolled seizures. Until further answers are available, the best way to prevent SUDEP is to lower your risk by controlling seizures. Research has found that people with all types of epilepsy that experience convulsive seizures can be at risk.  Please make sure you are staying well hydrated. I recommend 50-60 ounces daily. Well balanced diet and regular exercise encouraged. Consistent sleep schedule with 6-8 hours recommended.   Please continue follow up with care team as directed.   Follow up with Dr [name] in 1 year   You may receive a survey regarding today's visit. I  encourage you to leave honest feed back as I do use this information to improve patient care. Thank you for seeing me today!

## 2023-07-23 ENCOUNTER — Telehealth (INDEPENDENT_AMBULATORY_CARE_PROVIDER_SITE_OTHER): Payer: Medicare Other | Admitting: Family Medicine

## 2023-07-23 ENCOUNTER — Encounter: Payer: Self-pay | Admitting: Family Medicine

## 2023-07-23 DIAGNOSIS — N390 Urinary tract infection, site not specified: Secondary | ICD-10-CM

## 2023-07-23 DIAGNOSIS — G6289 Other specified polyneuropathies: Secondary | ICD-10-CM | POA: Diagnosis not present

## 2023-07-23 DIAGNOSIS — R569 Unspecified convulsions: Secondary | ICD-10-CM

## 2023-07-23 DIAGNOSIS — Z7401 Bed confinement status: Secondary | ICD-10-CM | POA: Diagnosis not present

## 2023-07-23 MED ORDER — LEVETIRACETAM 750 MG PO TABS: 1500.0000 mg | ORAL_TABLET | Freq: Two times a day (BID) | ORAL | 4 refills | Status: AC

## 2023-07-23 MED ORDER — LAMOTRIGINE 150 MG PO TABS: 150.0000 mg | ORAL_TABLET | Freq: Two times a day (BID) | ORAL | 3 refills | Status: AC

## 2023-08-24 ENCOUNTER — Encounter (HOSPITAL_BASED_OUTPATIENT_CLINIC_OR_DEPARTMENT_OTHER): Attending: General Surgery | Admitting: General Surgery

## 2023-08-24 DIAGNOSIS — E1142 Type 2 diabetes mellitus with diabetic polyneuropathy: Secondary | ICD-10-CM | POA: Diagnosis not present

## 2023-08-24 DIAGNOSIS — Z6837 Body mass index (BMI) 37.0-37.9, adult: Secondary | ICD-10-CM | POA: Diagnosis not present

## 2023-08-24 DIAGNOSIS — I509 Heart failure, unspecified: Secondary | ICD-10-CM | POA: Diagnosis not present

## 2023-08-24 DIAGNOSIS — L89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, stage 3: Secondary | ICD-10-CM | POA: Insufficient documentation

## 2023-08-24 DIAGNOSIS — G8220 Paraplegia, unspecified: Secondary | ICD-10-CM | POA: Insufficient documentation

## 2023-08-24 DIAGNOSIS — I110 Hypertensive heart disease with heart failure: Secondary | ICD-10-CM | POA: Diagnosis not present

## 2023-08-24 DIAGNOSIS — Z932 Ileostomy status: Secondary | ICD-10-CM | POA: Insufficient documentation

## 2023-10-06 ENCOUNTER — Encounter (HOSPITAL_BASED_OUTPATIENT_CLINIC_OR_DEPARTMENT_OTHER): Attending: General Surgery | Admitting: General Surgery

## 2023-10-06 DIAGNOSIS — G6289 Other specified polyneuropathies: Secondary | ICD-10-CM | POA: Diagnosis not present

## 2023-10-06 DIAGNOSIS — L89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, stage 3: Secondary | ICD-10-CM | POA: Insufficient documentation

## 2023-10-06 DIAGNOSIS — Z932 Ileostomy status: Secondary | ICD-10-CM | POA: Insufficient documentation

## 2023-11-13 ENCOUNTER — Other Ambulatory Visit: Payer: Self-pay | Admitting: Student

## 2023-11-20 ENCOUNTER — Encounter (HOSPITAL_BASED_OUTPATIENT_CLINIC_OR_DEPARTMENT_OTHER): Attending: General Surgery | Admitting: General Surgery

## 2023-11-20 DIAGNOSIS — E11622 Type 2 diabetes mellitus with other skin ulcer: Secondary | ICD-10-CM | POA: Diagnosis present

## 2023-11-20 DIAGNOSIS — Z6837 Body mass index (BMI) 37.0-37.9, adult: Secondary | ICD-10-CM | POA: Insufficient documentation

## 2023-11-20 DIAGNOSIS — Z932 Ileostomy status: Secondary | ICD-10-CM | POA: Diagnosis not present

## 2023-11-20 DIAGNOSIS — G6289 Other specified polyneuropathies: Secondary | ICD-10-CM | POA: Insufficient documentation

## 2023-11-20 DIAGNOSIS — L89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, stage 3: Secondary | ICD-10-CM | POA: Insufficient documentation

## 2023-12-30 ENCOUNTER — Other Ambulatory Visit: Payer: Self-pay | Admitting: Internal Medicine

## 2023-12-30 DIAGNOSIS — I480 Paroxysmal atrial fibrillation: Secondary | ICD-10-CM

## 2023-12-30 NOTE — Telephone Encounter (Signed)
 Pradaxa  150mg  refill request received. Pt is 73 years old, weight-140.2kg, Crea-0.95 on 07/15/23, last seen by Dr. Mona via Tele-Visit on 06/04/23, Diagnosis-Afib, CrCl-141.43 mL/min ; Dose is appropriate based on dosing criteria. Will send in refill to requested pharmacy.

## 2024-01-01 ENCOUNTER — Encounter (HOSPITAL_BASED_OUTPATIENT_CLINIC_OR_DEPARTMENT_OTHER): Attending: General Surgery | Admitting: General Surgery

## 2024-01-01 DIAGNOSIS — E11622 Type 2 diabetes mellitus with other skin ulcer: Secondary | ICD-10-CM | POA: Diagnosis present

## 2024-01-01 DIAGNOSIS — G6289 Other specified polyneuropathies: Secondary | ICD-10-CM | POA: Diagnosis not present

## 2024-01-01 DIAGNOSIS — L89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, stage 3: Secondary | ICD-10-CM | POA: Insufficient documentation

## 2024-01-01 DIAGNOSIS — Z932 Ileostomy status: Secondary | ICD-10-CM | POA: Diagnosis not present

## 2024-02-12 ENCOUNTER — Encounter (HOSPITAL_BASED_OUTPATIENT_CLINIC_OR_DEPARTMENT_OTHER): Admitting: Internal Medicine

## 2024-02-14 ENCOUNTER — Other Ambulatory Visit: Payer: Self-pay | Admitting: Internal Medicine

## 2024-02-19 ENCOUNTER — Encounter (HOSPITAL_BASED_OUTPATIENT_CLINIC_OR_DEPARTMENT_OTHER): Attending: Internal Medicine | Admitting: Internal Medicine

## 2024-02-19 DIAGNOSIS — G6289 Other specified polyneuropathies: Secondary | ICD-10-CM | POA: Diagnosis not present

## 2024-02-19 DIAGNOSIS — Z932 Ileostomy status: Secondary | ICD-10-CM | POA: Diagnosis not present

## 2024-02-19 DIAGNOSIS — E11622 Type 2 diabetes mellitus with other skin ulcer: Secondary | ICD-10-CM | POA: Diagnosis present

## 2024-03-23 ENCOUNTER — Other Ambulatory Visit: Payer: Self-pay | Admitting: Student

## 2024-04-01 ENCOUNTER — Encounter (HOSPITAL_BASED_OUTPATIENT_CLINIC_OR_DEPARTMENT_OTHER): Attending: General Surgery | Admitting: General Surgery

## 2024-04-01 DIAGNOSIS — L89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, stage 3: Secondary | ICD-10-CM | POA: Insufficient documentation

## 2024-04-01 DIAGNOSIS — G6289 Other specified polyneuropathies: Secondary | ICD-10-CM | POA: Insufficient documentation

## 2024-04-01 DIAGNOSIS — Z932 Ileostomy status: Secondary | ICD-10-CM | POA: Insufficient documentation

## 2024-04-01 DIAGNOSIS — E11622 Type 2 diabetes mellitus with other skin ulcer: Secondary | ICD-10-CM | POA: Insufficient documentation

## 2024-04-07 ENCOUNTER — Other Ambulatory Visit: Payer: Self-pay | Admitting: Student

## 2024-04-15 ENCOUNTER — Other Ambulatory Visit: Payer: Self-pay | Admitting: Student

## 2024-04-18 ENCOUNTER — Encounter: Payer: Self-pay | Admitting: Internal Medicine

## 2024-04-18 ENCOUNTER — Telehealth: Payer: Self-pay | Admitting: Internal Medicine

## 2024-04-18 MED ORDER — DILTIAZEM HCL ER COATED BEADS 360 MG PO CP24: 360.0000 mg | ORAL_CAPSULE | Freq: Every day | ORAL | 0 refills | Status: AC

## 2024-04-18 NOTE — Telephone Encounter (Signed)
 Pt wife called and to sch a mychart visit.  Pt is bed bound and can only to [place] visits   Best 336 [phone]

## 2024-04-18 NOTE — Telephone Encounter (Signed)
 Spoke with pt wife, she is aware and made the next available appointment with dr hilty.

## 2024-04-18 NOTE — Telephone Encounter (Signed)
 Will forward to dr hilty to make sure video visit is okay to schedule.

## 2024-05-20 ENCOUNTER — Encounter (HOSPITAL_BASED_OUTPATIENT_CLINIC_OR_DEPARTMENT_OTHER): Attending: General Surgery | Admitting: General Surgery

## 2024-05-20 DIAGNOSIS — Z932 Ileostomy status: Secondary | ICD-10-CM | POA: Diagnosis not present

## 2024-05-20 DIAGNOSIS — E11622 Type 2 diabetes mellitus with other skin ulcer: Secondary | ICD-10-CM | POA: Insufficient documentation

## 2024-05-20 DIAGNOSIS — L89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, stage 3: Secondary | ICD-10-CM | POA: Insufficient documentation

## 2024-05-20 DIAGNOSIS — Z6837 Body mass index (BMI) 37.0-37.9, adult: Secondary | ICD-10-CM | POA: Diagnosis not present

## 2024-05-20 DIAGNOSIS — G6289 Other specified polyneuropathies: Secondary | ICD-10-CM | POA: Diagnosis not present

## 2024-06-07 ENCOUNTER — Telehealth: Payer: Self-pay | Admitting: Internal Medicine

## 2024-06-07 NOTE — Telephone Encounter (Signed)
 From prior documentation regarding same question (see below). Please call patient/wife.   Mona Vinie BROCKS, MD to Richie Adrien ORN, RN     04/18/24  4:14 PM Regrettably, medicare no longer reimburses for video visits so we cannot schedule.   Dr. Mona

## 2024-06-07 NOTE — Telephone Encounter (Signed)
 Pts wife calling to ask if patients appt can be video due to him being bed bound. Please advise.

## 2024-07-01 ENCOUNTER — Encounter (HOSPITAL_BASED_OUTPATIENT_CLINIC_OR_DEPARTMENT_OTHER): Admitting: General Surgery

## 2024-07-05 ENCOUNTER — Telehealth: Admitting: Neurology

## 2024-07-25 ENCOUNTER — Telehealth: Admitting: Neurology

## 2024-07-28 ENCOUNTER — Ambulatory Visit: Admitting: Internal Medicine
